# Patient Record
Sex: Female | Born: 1960
Health system: Southern US, Community
[De-identification: ages and names within clinical notes are randomized; demographics above are authoritative.]

## PROBLEM LIST (undated history)

## (undated) DIAGNOSIS — H539 Unspecified visual disturbance: Secondary | ICD-10-CM

## (undated) DIAGNOSIS — T7840XA Allergy, unspecified, initial encounter: Secondary | ICD-10-CM

## (undated) DIAGNOSIS — M5430 Sciatica, unspecified side: Secondary | ICD-10-CM

## (undated) DIAGNOSIS — I4510 Unspecified right bundle-branch block: Secondary | ICD-10-CM

## (undated) DIAGNOSIS — D649 Anemia, unspecified: Secondary | ICD-10-CM

## (undated) DIAGNOSIS — G35 Multiple sclerosis: Secondary | ICD-10-CM

## (undated) DIAGNOSIS — K219 Gastro-esophageal reflux disease without esophagitis: Secondary | ICD-10-CM

## (undated) DIAGNOSIS — K56609 Unspecified intestinal obstruction, unspecified as to partial versus complete obstruction: Secondary | ICD-10-CM

## (undated) DIAGNOSIS — N182 Chronic kidney disease, stage 2 (mild): Secondary | ICD-10-CM

## (undated) DIAGNOSIS — K589 Irritable bowel syndrome without diarrhea: Secondary | ICD-10-CM

## (undated) DIAGNOSIS — E669 Obesity, unspecified: Secondary | ICD-10-CM

## (undated) HISTORY — DX: Irritable bowel syndrome, unspecified: K58.9

## (undated) HISTORY — DX: Gastro-esophageal reflux disease without esophagitis: K21.9

## (undated) HISTORY — DX: Sciatica, unspecified side: M54.30

## (undated) HISTORY — DX: Unspecified visual disturbance: H53.9

## (undated) HISTORY — DX: Allergy, unspecified, initial encounter: T78.40XA

## (undated) HISTORY — DX: Unspecified intestinal obstruction, unspecified as to partial versus complete obstruction: K56.609

## (undated) HISTORY — DX: Obesity, unspecified: E66.9

## (undated) HISTORY — PX: KNEE ARTHROSCOPY: SUR90

---

## 1999-08-02 ENCOUNTER — Ambulatory Visit (HOSPITAL_COMMUNITY): Admission: RE | Admit: 1999-08-02 | Discharge: 1999-08-02 | Payer: Self-pay | Admitting: Family Medicine

## 1999-08-02 ENCOUNTER — Encounter: Payer: Self-pay | Admitting: Family Medicine

## 2003-11-18 ENCOUNTER — Other Ambulatory Visit: Admission: RE | Admit: 2003-11-18 | Discharge: 2003-11-18 | Payer: Self-pay | Admitting: Family Medicine

## 2005-09-29 ENCOUNTER — Emergency Department (HOSPITAL_COMMUNITY): Admission: EM | Admit: 2005-09-29 | Discharge: 2005-09-29 | Payer: Self-pay | Admitting: Family Medicine

## 2006-01-30 HISTORY — PX: ABDOMINAL HYSTERECTOMY: SHX81

## 2006-08-20 ENCOUNTER — Emergency Department (HOSPITAL_COMMUNITY): Admission: EM | Admit: 2006-08-20 | Discharge: 2006-08-20 | Payer: Self-pay | Admitting: Family Medicine

## 2006-09-12 ENCOUNTER — Other Ambulatory Visit: Admission: RE | Admit: 2006-09-12 | Discharge: 2006-09-12 | Payer: Self-pay | Admitting: Gynecology

## 2006-09-24 ENCOUNTER — Ambulatory Visit (HOSPITAL_COMMUNITY): Admission: RE | Admit: 2006-09-24 | Discharge: 2006-09-24 | Payer: Self-pay | Admitting: Gynecology

## 2006-10-05 ENCOUNTER — Encounter (INDEPENDENT_AMBULATORY_CARE_PROVIDER_SITE_OTHER): Payer: Self-pay | Admitting: *Deleted

## 2006-10-05 ENCOUNTER — Other Ambulatory Visit: Admission: RE | Admit: 2006-10-05 | Discharge: 2006-10-05 | Payer: Self-pay | Admitting: *Deleted

## 2006-11-14 ENCOUNTER — Emergency Department (HOSPITAL_COMMUNITY): Admission: EM | Admit: 2006-11-14 | Discharge: 2006-11-14 | Payer: Self-pay | Admitting: Family Medicine

## 2007-01-31 DIAGNOSIS — M5430 Sciatica, unspecified side: Secondary | ICD-10-CM

## 2007-01-31 HISTORY — DX: Sciatica, unspecified side: M54.30

## 2007-02-04 ENCOUNTER — Encounter: Admission: RE | Admit: 2007-02-04 | Discharge: 2007-02-04 | Payer: Self-pay | Admitting: *Deleted

## 2007-02-12 ENCOUNTER — Encounter: Admission: RE | Admit: 2007-02-12 | Discharge: 2007-05-13 | Payer: Self-pay | Admitting: *Deleted

## 2007-05-14 ENCOUNTER — Encounter: Admission: RE | Admit: 2007-05-14 | Discharge: 2007-06-21 | Payer: Self-pay | Admitting: *Deleted

## 2007-08-27 ENCOUNTER — Encounter: Admission: RE | Admit: 2007-08-27 | Discharge: 2007-11-25 | Payer: Self-pay | Admitting: Obstetrics and Gynecology

## 2008-03-03 ENCOUNTER — Encounter
Admission: RE | Admit: 2008-03-03 | Discharge: 2008-03-25 | Payer: Self-pay | Admitting: Physical Medicine and Rehabilitation

## 2008-03-21 ENCOUNTER — Observation Stay (HOSPITAL_COMMUNITY): Admission: EM | Admit: 2008-03-21 | Discharge: 2008-03-22 | Payer: Self-pay | Admitting: Emergency Medicine

## 2008-03-21 ENCOUNTER — Ambulatory Visit: Payer: Self-pay | Admitting: Internal Medicine

## 2008-04-13 ENCOUNTER — Ambulatory Visit: Admission: RE | Admit: 2008-04-13 | Discharge: 2008-04-13 | Payer: Self-pay | Admitting: Specialist

## 2008-04-13 ENCOUNTER — Encounter (INDEPENDENT_AMBULATORY_CARE_PROVIDER_SITE_OTHER): Payer: Self-pay | Admitting: Specialist

## 2008-04-13 ENCOUNTER — Ambulatory Visit: Payer: Self-pay | Admitting: Vascular Surgery

## 2008-05-22 ENCOUNTER — Ambulatory Visit (HOSPITAL_BASED_OUTPATIENT_CLINIC_OR_DEPARTMENT_OTHER): Admission: RE | Admit: 2008-05-22 | Discharge: 2008-05-22 | Payer: Self-pay | Admitting: Specialist

## 2008-07-25 ENCOUNTER — Inpatient Hospital Stay (HOSPITAL_COMMUNITY): Admission: EM | Admit: 2008-07-25 | Discharge: 2008-07-29 | Payer: Self-pay | Admitting: Emergency Medicine

## 2008-11-02 ENCOUNTER — Ambulatory Visit (HOSPITAL_BASED_OUTPATIENT_CLINIC_OR_DEPARTMENT_OTHER): Admission: RE | Admit: 2008-11-02 | Discharge: 2008-11-02 | Payer: Self-pay | Admitting: Specialist

## 2010-02-20 ENCOUNTER — Encounter: Payer: Self-pay | Admitting: Family Medicine

## 2010-05-05 LAB — POCT HEMOGLOBIN-HEMACUE: Hemoglobin: 11.2 g/dL — ABNORMAL LOW (ref 12.0–15.0)

## 2010-05-09 LAB — CBC
MCV: 85.2 fL (ref 78.0–100.0)
Platelets: 176 10*3/uL (ref 150–400)
WBC: 10 10*3/uL (ref 4.0–10.5)

## 2010-05-09 LAB — RAPID URINE DRUG SCREEN, HOSP PERFORMED
Benzodiazepines: NOT DETECTED
Cocaine: NOT DETECTED
Opiates: POSITIVE — AB

## 2010-05-09 LAB — POCT CARDIAC MARKERS
CKMB, poc: <1 ng/mL — ABNORMAL LOW (ref 1.0–8.0)
Myoglobin, poc: 62.9 ng/mL (ref 12–200)
Myoglobin, poc: 78.6 ng/mL (ref 12–200)
Troponin i, poc: <0.05 ng/mL (ref 0.00–0.09)

## 2010-05-09 LAB — URINALYSIS, ROUTINE W REFLEX MICROSCOPIC
Bilirubin Urine: NEGATIVE
Bilirubin Urine: NEGATIVE
Glucose, UA: NEGATIVE mg/dL
Hgb urine dipstick: NEGATIVE
Ketones, ur: NEGATIVE mg/dL
Protein, ur: NEGATIVE mg/dL
Specific Gravity, Urine: 1.013 (ref 1.005–1.030)
Urobilinogen, UA: 0.2 mg/dL (ref 0.0–1.0)
pH: 8.5 — ABNORMAL HIGH (ref 5.0–8.0)

## 2010-05-09 LAB — CARDIAC PANEL(CRET KIN+CKTOT+MB+TROPI)
Relative Index: 0.9 (ref 0.0–2.5)
Total CK: 162 U/L (ref 7–177)
Troponin I: 0.01 ng/mL (ref 0.00–0.06)

## 2010-05-09 LAB — URINE MICROSCOPIC-ADD ON

## 2010-05-09 LAB — URINALYSIS, MICROSCOPIC ONLY
Glucose, UA: NEGATIVE mg/dL
Ketones, ur: NEGATIVE mg/dL
Nitrite: NEGATIVE
Protein, ur: NEGATIVE mg/dL

## 2010-05-09 LAB — COMPREHENSIVE METABOLIC PANEL
ALT: 9 U/L (ref 0–35)
AST: 10 U/L (ref 0–37)
Albumin: 3.4 g/dL — ABNORMAL LOW (ref 3.5–5.2)
Chloride: 107 mEq/L (ref 96–112)
Creatinine, Ser: 0.87 mg/dL (ref 0.4–1.2)
GFR calc Af Amer: >60 mL/min (ref >60)
Potassium: 4 mEq/L (ref 3.5–5.1)
Sodium: 137 mEq/L (ref 135–145)
Total Bilirubin: 0.4 mg/dL (ref 0.3–1.2)

## 2010-05-09 LAB — DIFFERENTIAL
Basophils Absolute: 0 10*3/uL (ref 0.0–0.1)
Lymphs Abs: 1 10*3/uL (ref 0.7–4.0)
Monocytes Relative: 5 % (ref 3–12)
Neutro Abs: 8.5 10*3/uL — ABNORMAL HIGH (ref 1.7–7.7)

## 2010-05-09 LAB — URINE CULTURE: Colony Count: 25000

## 2010-05-09 LAB — SEDIMENTATION RATE: Sed Rate: 27 mm/hr — ABNORMAL HIGH (ref 0–22)

## 2010-05-09 LAB — FOLATE RBC: RBC Folate: 546 ng/mL (ref 180–600)

## 2010-05-09 LAB — VITAMIN B12: Vitamin B-12: 883 pg/mL (ref 211–911)

## 2010-05-17 LAB — COMPREHENSIVE METABOLIC PANEL
ALT: 14 U/L (ref 0–35)
AST: 15 U/L (ref 0–37)
Albumin: 3.8 g/dL (ref 3.5–5.2)
Alkaline Phosphatase: 70 U/L (ref 39–117)
Calcium: 9.1 mg/dL (ref 8.4–10.5)
GFR calc Af Amer: >60 mL/min (ref >60)
Potassium: 4 mEq/L (ref 3.5–5.1)
Sodium: 138 mEq/L (ref 135–145)
Total Protein: 7.1 g/dL (ref 6.0–8.3)

## 2010-05-17 LAB — URINALYSIS, ROUTINE W REFLEX MICROSCOPIC
Glucose, UA: 100 mg/dL — AB
Glucose, UA: NEGATIVE mg/dL
Hgb urine dipstick: NEGATIVE
Ketones, ur: NEGATIVE mg/dL
Protein, ur: 100 mg/dL — AB
Protein, ur: NEGATIVE mg/dL
Specific Gravity, Urine: 1.02 (ref 1.005–1.030)
Urobilinogen, UA: 1 mg/dL (ref 0.0–1.0)
pH: 7.5 (ref 5.0–8.0)

## 2010-05-17 LAB — DIFFERENTIAL
Basophils Relative: 0 % (ref 0–1)
Eosinophils Absolute: 0 10*3/uL (ref 0.0–0.7)
Lymphs Abs: 1.4 10*3/uL (ref 0.7–4.0)
Monocytes Absolute: 0.5 10*3/uL (ref 0.1–1.0)
Monocytes Relative: 6 % (ref 3–12)

## 2010-05-17 LAB — CK TOTAL AND CKMB (NOT AT ARMC): CK, MB: 1.4 ng/mL (ref 0.3–4.0)

## 2010-05-17 LAB — CBC
Hemoglobin: 12.5 g/dL (ref 12.0–15.0)
MCHC: 34.4 g/dL (ref 30.0–36.0)
Platelets: 206 10*3/uL (ref 150–400)
RDW: 14.4 % (ref 11.5–15.5)

## 2010-05-17 LAB — URINE MICROSCOPIC-ADD ON
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
## 2010-11-14 LAB — POCT URINALYSIS DIP (DEVICE)
Ketones, ur: NEGATIVE
Operator id: 235561
Specific Gravity, Urine: >=1.03

## 2010-11-14 LAB — POCT PREGNANCY, URINE: Operator id: 235561

## 2012-06-10 ENCOUNTER — Ambulatory Visit: Payer: Self-pay

## 2012-06-10 ENCOUNTER — Encounter: Payer: Self-pay | Admitting: Internal Medicine

## 2012-06-11 ENCOUNTER — Encounter: Payer: Self-pay | Admitting: Internal Medicine

## 2012-06-19 ENCOUNTER — Ambulatory Visit (INDEPENDENT_AMBULATORY_CARE_PROVIDER_SITE_OTHER): Payer: No Typology Code available for payment source | Admitting: Internal Medicine

## 2012-06-19 ENCOUNTER — Encounter: Payer: Self-pay | Admitting: Internal Medicine

## 2012-06-19 VITALS — BP 133/86 | HR 90 | Temp 97.1°F | Ht 63.0 in | Wt 230.6 lb

## 2012-06-19 DIAGNOSIS — Z1331 Encounter for screening for depression: Secondary | ICD-10-CM | POA: Insufficient documentation

## 2012-06-19 DIAGNOSIS — Z0000 Encounter for general adult medical examination without abnormal findings: Secondary | ICD-10-CM

## 2012-06-19 DIAGNOSIS — Z1239 Encounter for other screening for malignant neoplasm of breast: Secondary | ICD-10-CM

## 2012-06-19 DIAGNOSIS — R635 Abnormal weight gain: Secondary | ICD-10-CM

## 2012-06-19 DIAGNOSIS — K589 Irritable bowel syndrome without diarrhea: Secondary | ICD-10-CM

## 2012-06-19 LAB — CBC
HCT: 35.4 % — ABNORMAL LOW (ref 36.0–46.0)
Hemoglobin: 11.6 g/dL — ABNORMAL LOW (ref 12.0–15.0)
MCV: 82.3 fL (ref 78.0–100.0)
RBC: 4.3 MIL/uL (ref 3.87–5.11)
RDW: 15.9 % — ABNORMAL HIGH (ref 11.5–15.5)
WBC: 7.1 10*3/uL (ref 4.0–10.5)

## 2012-06-19 MED ORDER — AMITRIPTYLINE HCL 50 MG PO TABS: 50.0000 mg | ORAL_TABLET | Freq: Every day | ORAL | Status: DC

## 2012-06-19 MED ORDER — ONE-DAILY MULTI VITAMINS PO TABS: 1.0000 | ORAL_TABLET | Freq: Every day | ORAL | Status: DC

## 2012-06-19 MED ORDER — POLYETHYL GLYCOL-PROPYL GLYCOL 0.4-0.3 % OP SOLN: 1.0000 [drp] | OPHTHALMIC | Status: DC | PRN

## 2012-06-19 MED ORDER — OMEGA-3 FATTY ACIDS 1000 MG PO CAPS: 2.0000 g | ORAL_CAPSULE | Freq: Every day | ORAL | Status: DC

## 2012-06-19 MED ORDER — B COMPLEX PO TABS: 1.0000 | ORAL_TABLET | Freq: Every day | ORAL | Status: DC

## 2012-06-19 NOTE — Patient Instructions (Addendum)
Please return to clinic in one month for followup   Exercise to Lose Weight Exercise and a healthy diet may help you lose weight. Your doctor may suggest specific exercises. EXERCISE IDEAS AND TIPS  Choose low-cost things you enjoy doing, such as walking, bicycling, or exercising to workout videos.  Take stairs instead of the elevator.  Walk during your lunch break.  Park your car further away from work or school.  Go to a gym or an exercise class.  Start with 5 to 10 minutes of exercise each day. Build up to 30 minutes of exercise 4 to 6 days a week.  Wear shoes with good support and comfortable clothes.  Stretch before and after working out.  Work out until you breathe harder and your heart beats faster.  Drink extra water when you exercise.  Do not do so much that you hurt yourself, feel dizzy, or get very short of breath. Exercises that burn about 150 calories:  Running 1  miles in 15 minutes.  Playing volleyball for 45 to 60 minutes.  Washing and waxing a car for 45 to 60 minutes.  Playing touch football for 45 minutes.  Walking 1  miles in 35 minutes.  Pushing a stroller 1  miles in 30 minutes.  Playing basketball for 30 minutes.  Raking leaves for 30 minutes.  Bicycling 5 miles in 30 minutes.  Walking 2 miles in 30 minutes.  Dancing for 30 minutes.  Shoveling snow for 15 minutes.  Swimming laps for 20 minutes.  Walking up stairs for 15 minutes.  Bicycling 4 miles in 15 minutes.  Gardening for 30 to 45 minutes.  Jumping rope for 15 minutes.  Washing windows or floors for 45 to 60 minutes. Document Released: 02/18/2010 Document Revised: 04/10/2011 Document Reviewed: 02/18/2010 [facility] Patient Information 2013 [place], [place].
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
## 2012-06-19 NOTE — Assessment & Plan Note (Addendum)
Patient with long time history of IBS. She had a colonoscopy in June 2013 which was unremarkable per patient. We are requesting records from her physician to confirm this. Patient will essentially not try any of the therapies I recommend to her. We discussed how trycyclics and antispasmodics such as dicyclomine are first-line therapy in IBS. She states that she is unwilling to try these medications due to their side effects. When I mention amitriptyline, she states that she has tried this in the past and it caused her to gain weight. She again states that the phentermine is the only thing that helps her IBS and that she would like. I handed her a prescription for amitriptyline, however, she declined this medication. On her way up the door, she told the nurse that she would be finding another doctor.  -As patient will not try any of the recommended medications, by culture she may use Imodium as needed for now so recommended a high fiber diet -Encourage Weight loss

## 2012-06-19 NOTE — Assessment & Plan Note (Addendum)
Patient reports 30 pound weight gain over the past 8 months. She states that this is likely because of her inactivity and dietary indiscretions. She asked for phentermine, however, I discussed with the patient that this is not the best option for her and has many side effects, some of which are not known until years later. Patient states that she is unwilling to change her lifestyle and her diet because she has to take care of her husband and doesn't have time for herself. That is why she wants this medication so she can use a pill rather than make the necessary lifestyle changes. I advised her that this was not the best approach and encouraged her to work with [place] to see if we can work together on a better weight loss plan, however, she declined.  -I would like to refer her to nutritional counselor, however, she declined -Will check TSH today for medical causes of weight gain -Diet and exercise counseling today

## 2012-06-19 NOTE — Assessment & Plan Note (Addendum)
Request records from previous PCP. Patient had recent colonoscopy in June 2013, we are requesting records.  -Will make referral for mammogram -Will get baseline BNP

## 2012-06-20 LAB — TSH: TSH: 1.687 u[IU]/mL (ref 0.350–4.500)

## 2012-06-20 LAB — BASIC METABOLIC PANEL WITH GFR
CO2: 25 mEq/L (ref 19–32)
Chloride: 109 mEq/L (ref 96–112)
GFR, Est Non African American: 70 mL/min
Potassium: 4.2 mEq/L (ref 3.5–5.3)
Sodium: 143 mEq/L (ref 135–145)

## 2012-06-20 NOTE — Progress Notes (Signed)
Case discussed with Dr.Kesty soon after the resident saw the patient.  We reviewed the resident's history and exam and pertinent patient test results.  I agree with the assessment, diagnosis and plan of care documented in the resident's note. 

## 2012-07-29 ENCOUNTER — Encounter: Payer: Self-pay | Admitting: *Deleted

## 2012-07-29 ENCOUNTER — Ambulatory Visit (INDEPENDENT_AMBULATORY_CARE_PROVIDER_SITE_OTHER): Payer: No Typology Code available for payment source | Admitting: Dietician

## 2012-07-29 VITALS — Ht 63.0 in | Wt 224.1 lb

## 2012-07-29 DIAGNOSIS — R635 Abnormal weight gain: Secondary | ICD-10-CM
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
## 2012-07-31 ENCOUNTER — Ambulatory Visit (INDEPENDENT_AMBULATORY_CARE_PROVIDER_SITE_OTHER): Payer: No Typology Code available for payment source | Admitting: Internal Medicine

## 2012-07-31 ENCOUNTER — Ambulatory Visit (HOSPITAL_COMMUNITY)
Admission: RE | Admit: 2012-07-31 | Discharge: 2012-07-31 | Disposition: A | Discharge status: Home / Self Care | Payer: No Typology Code available for payment source | Source: Ambulatory Visit | Attending: Internal Medicine | Admitting: Internal Medicine

## 2012-07-31 ENCOUNTER — Encounter: Payer: Self-pay | Admitting: Internal Medicine

## 2012-07-31 VITALS — BP 121/80 | HR 60 | Temp 97.9°F | Ht 63.0 in | Wt 225.0 lb

## 2012-07-31 DIAGNOSIS — K589 Irritable bowel syndrome without diarrhea: Secondary | ICD-10-CM

## 2012-07-31 DIAGNOSIS — I4510 Unspecified right bundle-branch block: Secondary | ICD-10-CM

## 2012-07-31 DIAGNOSIS — R0789 Other chest pain: Secondary | ICD-10-CM | POA: Insufficient documentation

## 2012-07-31 DIAGNOSIS — R9431 Abnormal electrocardiogram [ECG] [EKG]: Secondary | ICD-10-CM | POA: Insufficient documentation

## 2012-07-31 DIAGNOSIS — Z0000 Encounter for general adult medical examination without abnormal findings: Secondary | ICD-10-CM

## 2012-07-31 DIAGNOSIS — R609 Edema, unspecified: Secondary | ICD-10-CM | POA: Insufficient documentation

## 2012-07-31 DIAGNOSIS — Z1239 Encounter for other screening for malignant neoplasm of breast: Secondary | ICD-10-CM

## 2012-07-31 LAB — CBC WITH DIFFERENTIAL/PLATELET
Basophils Absolute: 0 10*3/uL (ref 0.0–0.1)
Eosinophils Absolute: 0 10*3/uL (ref 0.0–0.7)
Eosinophils Relative: 0 % (ref 0–5)
Lymphocytes Relative: 27 % (ref 12–46)
MCV: 84.7 fL (ref 78.0–100.0)
Neutrophils Relative %: 66 % (ref 43–77)
Platelets: 191 10*3/uL (ref 150–400)
RDW: 14.2 % (ref 11.5–15.5)
WBC: 7.6 10*3/uL (ref 4.0–10.5)

## 2012-07-31 LAB — COMPREHENSIVE METABOLIC PANEL
Albumin: 3.4 g/dL — ABNORMAL LOW (ref 3.5–5.2)
Alkaline Phosphatase: 79 U/L (ref 39–117)
CO2: 29 mEq/L (ref 19–32)
Calcium: 9.1 mg/dL (ref 8.4–10.5)
Chloride: 105 mEq/L (ref 96–112)
Glucose, Bld: 89 mg/dL (ref 70–99)
Potassium: 4.2 mEq/L (ref 3.5–5.3)
Sodium: 140 mEq/L (ref 135–145)
Total Protein: 6.5 g/dL (ref 6.0–8.3)
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
## 2012-07-31 NOTE — Patient Instructions (Addendum)
Please bring all your records on your next visit  You have been advised NOT to drive based on the documentation you presented with today.  Please follow back up with Dr. Genevive Bi for clearance  We will follow up with you in regards to your results from lab work today  Please have your mammogram done

## 2012-07-31 NOTE — Progress Notes (Signed)
I discussed this patient thoroughly with Dr. Virgina Organ during the visit.  The patient has been to several hospitals and clinics and reports several dramatic events.  Her immediate concern of a "blockage" in her heart turns out to be RBBB on EKG (otherwise normal) is not an emergency.  The next steps require obtaining old records to avoid duplication and  misinterpretation.

## 2012-08-01 DIAGNOSIS — R609 Edema, unspecified: Secondary | ICD-10-CM | POA: Insufficient documentation

## 2012-08-01 DIAGNOSIS — I4510 Unspecified right bundle-branch block: Secondary | ICD-10-CM | POA: Insufficient documentation

## 2012-08-01 NOTE — Assessment & Plan Note (Signed)
Mammogram referral made again today Apparently had colonoscopy at [facility] medical center, need records Refer to GYN after reviewing records

## 2012-08-01 NOTE — Assessment & Plan Note (Addendum)
Reviewed EKG results with patient.  Will review prior cardiac history with all records and pursue further if echo needed and other cardiac work up.  Reports occasional substernal chest pressure and having to catch her breath especially with exercise and walking distances, for example from parking lot to clinic, but improved at rest.  Denies obvious chest pain or prior MI but does report apparently cardiac arrests x2 while in OR in the past that she was not aware of until later.    -probnp 50.9 on 07/31/12

## 2012-08-01 NOTE — Assessment & Plan Note (Addendum)
Reports swelling of hands and feet x1 month.  Non pitting edema of lower extremities, appeared mild edema but first time meeting patient and she says it was improved at this time.    -probnp 50.9 and albumin 3.4 from labs of 07/31/12.  TSH 1.687 05/2012 -continue to monitor for now -recommended elevating legs and monitoring sodium intake

## 2012-08-01 NOTE — Assessment & Plan Note (Signed)
Claims she knows how to control her diet to help with IBS symptoms.  Had colonoscopy apparently at [facility] center last year, need records.  Reports success with only phentermine.  Did not want to try any other medications on last clinic visit.

## 2012-08-06 ENCOUNTER — Ambulatory Visit (HOSPITAL_COMMUNITY): Payer: No Typology Code available for payment source | Attending: Internal Medicine

## 2012-09-25 ENCOUNTER — Telehealth: Payer: Self-pay | Admitting: *Deleted

## 2012-09-25 NOTE — Telephone Encounter (Signed)
Pt called tooth ache past week - working on dental referral - pt states she talked to [facility] this AM. Pt wants antibiotics - appt made 09/26/12 8:45AM Dr Delane Ginger. Pt states she took Aleve and then in 2 hours took Ibuprofen 800mg  - pt warned about taking taking meds incorrect. Stanton Kidney Merritt Mccravy RN 09/25/12 10:45AM

## 2012-09-26 ENCOUNTER — Ambulatory Visit: Payer: No Typology Code available for payment source | Admitting: Internal Medicine

## 2012-09-26 ENCOUNTER — Encounter: Payer: Self-pay | Admitting: Internal Medicine

## 2012-09-26 ENCOUNTER — Ambulatory Visit (INDEPENDENT_AMBULATORY_CARE_PROVIDER_SITE_OTHER): Payer: No Typology Code available for payment source | Admitting: Internal Medicine

## 2012-09-26 VITALS — BP 132/81 | HR 75 | Temp 98.2°F | Wt 227.8 lb

## 2012-09-26 DIAGNOSIS — Z0000 Encounter for general adult medical examination without abnormal findings: Secondary | ICD-10-CM

## 2012-09-26 DIAGNOSIS — K0401 Reversible pulpitis: Secondary | ICD-10-CM

## 2012-09-26 DIAGNOSIS — K044 Acute apical periodontitis of pulpal origin: Secondary | ICD-10-CM

## 2012-09-26 DIAGNOSIS — Z23 Encounter for immunization: Secondary | ICD-10-CM

## 2012-09-26 MED ORDER — HYDROCODONE-ACETAMINOPHEN 5-325 MG PO TABS: 1.0000 | ORAL_TABLET | Freq: Four times a day (QID) | ORAL | Status: DC | PRN

## 2012-09-26 MED ORDER — CLINDAMYCIN HCL 300 MG PO CAPS: 300.0000 mg | ORAL_CAPSULE | Freq: Three times a day (TID) | ORAL | Status: DC

## 2012-09-26 NOTE — Patient Instructions (Addendum)
It was a pleasure to meet you today! - We will make a referral to the Dental Clinic and the Women's Clinic at [facility]. - We will prescribe an antibiotic called Clindamycin that should be taken until completed.  - We will also prescribe you pain medication called Norco that you can take as needed. You can continue to take your ibuprofen and take Norco as needed. - If you develop swelling in your mouth or outside of your mouth, fever, chills or difficulty breathing, seek medical attention.  - You can also try to visit the [facility] clinic at [facility] on Wednesday evenings. See the attached sheet.

## 2012-09-26 NOTE — Progress Notes (Signed)
Subjective:     Patient ID: Tamara [name], female   DOB: 08-Nov-1960, 52 y.o.   MRN: [id number]  HPI: Patient reports tooth ache for the last 2 months and was told she was referred previously, but when she contacted the dental clinic (on her orange card) she was told that there was no referral in their system. She reports that the tooth is painful when she breathes in air and drinks cold fluids. She rates her pain 10/10 and that it keeps her up through out the night. She does take aleve and ibuprofen for the pain that allows her to eat. The pain is reduced to 8/10 with medication. She is currently taking 2 OTC and 800mg  Ibuprofen in total through out the day. She reports feeling drowsy while taking the ibuprofen and aleve. The pain is reported in the UR, LR and LL quadrants. In the past she had a root canal completed but did not have a crown placed over the restoration. She denies swelling, difficulty managing secretions or difficulty breathing. She reports occasional fevers, but is afebrile today.    Review of Systems  Constitutional: Positive for fever and appetite change. Negative for chills.       Pian when eating has limited diet.   Reports low grade, intermittent fever.   HENT: Positive for ear pain, trouble swallowing and dental problem. Negative for hearing loss, nosebleeds, congestion, sore throat, facial swelling, rhinorrhea, sneezing, drooling, mouth sores, neck pain, neck stiffness, voice change, postnasal drip, sinus pressure, tinnitus and ear discharge.        Pain in right ear that radiates from dental pain.    Eyes: Negative.   Respiratory: Negative.   Cardiovascular: Negative.   Gastrointestinal: Negative for nausea, vomiting, diarrhea and constipation.  Endocrine: Negative.   Genitourinary: Negative.   Musculoskeletal: Negative.   Skin: Negative.   Allergic/Immunologic: Negative.   Neurological: Negative.   Hematological: Negative.   Psychiatric/Behavioral: Negative.          Objective:   Physical Exam  Constitutional: She is oriented to person, place, and time. She appears well-developed and well-nourished.  HENT:  Head: Normocephalic and atraumatic.  Mouth/Throat: No oropharyngeal exudate.    There is no intraoral swelling, trismus, parulis or discharge from decayed dentition appreciated. Teeth #3, 18 and 31 are mildly broken down.    Eyes: Conjunctivae and EOM are normal. Pupils are equal, round, and reactive to light. Scleral icterus is present.  Neck: Normal range of motion. Neck supple.  Cardiovascular: Normal rate and regular rhythm.   Pulmonary/Chest: Effort normal and breath sounds normal.  Abdominal: Soft. Bowel sounds are normal.  Musculoskeletal: Normal range of motion.  Lymphadenopathy:    She has no cervical adenopathy.  Neurological: She is alert and oriented to person, place, and time.  Skin: Skin is warm and dry.       Assessment/Plan:     Findings are consistent with grossly decayed dentition #3, 18 and 31. The symptoms of the dentition are suggestive of acute apical periodontitis 2/2 to incomplete root canal therapy, however dental radiographs are required to confirm diagnosis. Plan to refer to dental clinic for evaluation and treatment. Plan to prescribe 10 day course of Clindamycin 300mg  TID and Norco 5/325 (30 tabs) until patient can be scheduled by her dentist.

## 2012-09-26 NOTE — Progress Notes (Signed)
Subjective:     Patient ID: Tamara [name], female   DOB: 06/17/1960, 52 y.o.   MRN: [id number]  Dental Pain   HPI 52 year old female with chronic dental pain for the past 2 months, exacerbated with cold fluids. She is currently taking ibuprofen 800 OTC for the pain and reports very marginal improvement in her pain. She admits to pain in the right upper and lower and left lower regions. She wants a dental referral and something stronger for the pain. On enquiring about her pain medication allergies (codeine and hydromorphone), she said that her tongue got swollen with hydromorphone, however she has taken vicodin many times in the past with no adverse consequences. She denies any fever, chills, shortness of breath, tongue swelling, or painful nodes in the neck.   The patient has a long and complicated OBGYN history with extensive hysterectomy surgery secondary to menorrhagia (per patient report) and subsequent adhesions. She does have her cervix and one ovary intact. She does not get PAP smears because she claims to be sexually inactive. She requests an OBGYN referral.    Review of Systems As per HPI    Objective:   Physical Exam  HENT:  Mouth/Throat: She does not have dentures. No oral lesions. There is trismus in the jaw. Abnormal dentition. Dental caries present. No dental abscesses or lacerations.    Neck: Normal range of motion. Neck supple.  Cardiovascular: Normal rate and regular rhythm.   No murmur heard. Pulmonary/Chest: Effort normal and breath sounds normal.  Lymphadenopathy:    She has no cervical adenopathy.       Assessment & Plan:     Agree with the plan provided by the medical student.  Acute apical periodontitis - clindamycin 300 mg TID prescribed for 7 days with vicodin 5-325mg  30 tabs for pain. Referral to dental clinic given.   Referral to OBGYN given per patient request.  Flu shot given today.

## 2012-09-27 ENCOUNTER — Telehealth: Payer: Self-pay | Admitting: *Deleted

## 2012-09-27 NOTE — Telephone Encounter (Signed)
Pt called stating meds too expensive at [facility], pt has orange card and will get at [facility], scripts cancelled at [facility]

## 2012-10-08 ENCOUNTER — Encounter: Payer: Self-pay | Admitting: Medical

## 2012-11-21 ENCOUNTER — Encounter: Payer: Self-pay | Admitting: Medical

## 2012-11-21 ENCOUNTER — Ambulatory Visit (INDEPENDENT_AMBULATORY_CARE_PROVIDER_SITE_OTHER): Payer: No Typology Code available for payment source | Admitting: Medical

## 2012-11-21 VITALS — BP 126/79 | HR 60 | Temp 97.2°F | Ht 63.0 in | Wt 218.0 lb

## 2012-11-21 DIAGNOSIS — Z01419 Encounter for gynecological examination (general) (routine) without abnormal findings: Secondary | ICD-10-CM

## 2012-11-21 DIAGNOSIS — R3915 Urgency of urination: Secondary | ICD-10-CM

## 2012-11-21 LAB — POCT URINALYSIS DIP (DEVICE)
Leukocytes, UA: NEGATIVE
Protein, ur: NEGATIVE mg/dL
Urobilinogen, UA: 0.2 mg/dL (ref 0.0–1.0)
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
## 2012-12-11 ENCOUNTER — Ambulatory Visit (HOSPITAL_COMMUNITY)
Admission: RE | Admit: 2012-12-11 | Discharge: 2012-12-11 | Disposition: A | Discharge status: Home / Self Care | Payer: No Typology Code available for payment source | Source: Ambulatory Visit | Attending: Medical | Admitting: Medical

## 2012-12-11 DIAGNOSIS — Z01419 Encounter for gynecological examination (general) (routine) without abnormal findings: Secondary | ICD-10-CM

## 2012-12-11 DIAGNOSIS — Z1231 Encounter for screening mammogram for malignant neoplasm of breast: Secondary | ICD-10-CM | POA: Insufficient documentation

## 2012-12-19 ENCOUNTER — Ambulatory Visit: Payer: No Typology Code available for payment source

## 2013-07-08 ENCOUNTER — Ambulatory Visit (INDEPENDENT_AMBULATORY_CARE_PROVIDER_SITE_OTHER): Payer: Self-pay | Admitting: Internal Medicine

## 2013-07-08 ENCOUNTER — Encounter (HOSPITAL_COMMUNITY): Payer: Self-pay | Admitting: General Practice

## 2013-07-08 ENCOUNTER — Observation Stay (HOSPITAL_COMMUNITY)
Admission: AD | Admit: 2013-07-08 | Discharge: 2013-07-11 | Disposition: A | Discharge status: Skilled Nursing Facility | Payer: Self-pay | Source: Ambulatory Visit | Attending: Internal Medicine | Admitting: Internal Medicine

## 2013-07-08 ENCOUNTER — Encounter: Payer: Self-pay | Admitting: Internal Medicine

## 2013-07-08 VITALS — BP 116/76 | HR 68 | Temp 97.1°F | Wt 214.8 lb

## 2013-07-08 DIAGNOSIS — R195 Other fecal abnormalities: Secondary | ICD-10-CM | POA: Diagnosis present

## 2013-07-08 DIAGNOSIS — D649 Anemia, unspecified: Secondary | ICD-10-CM | POA: Diagnosis present

## 2013-07-08 DIAGNOSIS — R109 Unspecified abdominal pain: Secondary | ICD-10-CM | POA: Insufficient documentation

## 2013-07-08 DIAGNOSIS — R141 Gas pain: Secondary | ICD-10-CM | POA: Insufficient documentation

## 2013-07-08 DIAGNOSIS — K589 Irritable bowel syndrome without diarrhea: Secondary | ICD-10-CM | POA: Diagnosis present

## 2013-07-08 DIAGNOSIS — K922 Gastrointestinal hemorrhage, unspecified: Secondary | ICD-10-CM | POA: Insufficient documentation

## 2013-07-08 DIAGNOSIS — G4700 Insomnia, unspecified: Secondary | ICD-10-CM | POA: Insufficient documentation

## 2013-07-08 DIAGNOSIS — R42 Dizziness and giddiness: Secondary | ICD-10-CM | POA: Diagnosis present

## 2013-07-08 DIAGNOSIS — R143 Flatulence: Secondary | ICD-10-CM

## 2013-07-08 DIAGNOSIS — K625 Hemorrhage of anus and rectum: Principal | ICD-10-CM | POA: Insufficient documentation

## 2013-07-08 DIAGNOSIS — R142 Eructation: Secondary | ICD-10-CM

## 2013-07-08 DIAGNOSIS — Z9181 History of falling: Secondary | ICD-10-CM | POA: Insufficient documentation

## 2013-07-08 HISTORY — DX: Anemia, unspecified: D64.9

## 2013-07-08 LAB — CBC WITH DIFFERENTIAL/PLATELET
BASOS PCT: 0 % (ref 0–1)
Basophils Absolute: 0 10*3/uL (ref 0.0–0.1)
EOS ABS: 0 10*3/uL (ref 0.0–0.7)
EOS PCT: 0 % (ref 0–5)
HEMATOCRIT: 33.9 % — AB (ref 36.0–46.0)
HEMOGLOBIN: 11.1 g/dL — AB (ref 12.0–15.0)
LYMPHS ABS: 2.2 10*3/uL (ref 0.7–4.0)
Lymphocytes Relative: 27 % (ref 12–46)
MCH: 28.2 pg (ref 26.0–34.0)
MCHC: 32.7 g/dL (ref 30.0–36.0)
MCV: 86 fL (ref 78.0–100.0)
MONO ABS: 0.5 10*3/uL (ref 0.1–1.0)
MONOS PCT: 6 % (ref 3–12)
Neutro Abs: 5.4 10*3/uL (ref 1.7–7.7)
Neutrophils Relative %: 67 % (ref 43–77)
Platelets: 219 10*3/uL (ref 150–400)
RBC: 3.94 MIL/uL (ref 3.87–5.11)
RDW: 14.4 % (ref 11.5–15.5)
WBC: 8.1 10*3/uL (ref 4.0–10.5)

## 2013-07-08 LAB — BASIC METABOLIC PANEL WITH GFR
BUN: 16 mg/dL (ref 6–23)
CALCIUM: 9 mg/dL (ref 8.4–10.5)
CO2: 25 meq/L (ref 19–32)
Chloride: 104 mEq/L (ref 96–112)
Creat: 1.01 mg/dL (ref 0.50–1.10)
GFR, EST AFRICAN AMERICAN: 74 mL/min
GFR, Est Non African American: 64 mL/min
GLUCOSE: 81 mg/dL (ref 70–99)
Potassium: 4.1 mEq/L (ref 3.5–5.3)
SODIUM: 145 meq/L (ref 135–145)

## 2013-07-08 MED ORDER — SODIUM CHLORIDE 0.9 % IJ SOLN
3.0000 mL | Freq: Two times a day (BID) | INTRAMUSCULAR | Status: DC
  Administered 2013-07-09 – 2013-07-10 (×2): 3 mL via INTRAVENOUS

## 2013-07-08 MED ORDER — ZOLPIDEM TARTRATE 5 MG PO TABS
5.0000 mg | ORAL_TABLET | Freq: Every evening | ORAL | Status: DC | PRN
  Administered 2013-07-08 – 2013-07-09 (×2): 5 mg via ORAL
  Filled 2013-07-08 (×2): qty 1

## 2013-07-08 MED ORDER — SODIUM CHLORIDE 0.9 % IV SOLN: 250.0000 mL | INTRAVENOUS | Status: DC | PRN

## 2013-07-08 MED ORDER — ACETAMINOPHEN 650 MG RE SUPP
650.0000 mg | Freq: Four times a day (QID) | RECTAL | Status: DC | PRN
Start: 2013-07-08 — End: 2013-07-11

## 2013-07-08 MED ORDER — DOXYLAMINE SUCCINATE (SLEEP) 25 MG PO TABS: 25.0000 mg | ORAL_TABLET | Freq: Every evening | ORAL | Status: DC | PRN

## 2013-07-08 MED ORDER — PANTOPRAZOLE SODIUM 40 MG PO TBEC
40.0000 mg | DELAYED_RELEASE_TABLET | Freq: Every day | ORAL | Status: DC
  Administered 2013-07-08 – 2013-07-11 (×4): 40 mg via ORAL
  Filled 2013-07-08 (×4): qty 1

## 2013-07-08 MED ORDER — SODIUM CHLORIDE 0.9 % IJ SOLN: 3.0000 mL | INTRAMUSCULAR | Status: DC | PRN

## 2013-07-08 MED ORDER — ACETAMINOPHEN 325 MG PO TABS
650.0000 mg | ORAL_TABLET | Freq: Four times a day (QID) | ORAL | Status: DC | PRN
  Administered 2013-07-09 – 2013-07-10 (×4): 650 mg via ORAL
  Filled 2013-07-08 (×5): qty 2

## 2013-07-08 MED ORDER — ONDANSETRON 4 MG PO TBDP
4.0000 mg | ORAL_TABLET | Freq: Three times a day (TID) | ORAL | Status: DC | PRN
  Filled 2013-07-08: qty 1

## 2013-07-08 MED ORDER — SODIUM CHLORIDE 0.9 % IJ SOLN: 3.0000 mL | Freq: Two times a day (BID) | INTRAMUSCULAR | Status: DC

## 2013-07-08 NOTE — Patient Instructions (Signed)
  Rectal Bleeding  Rectal bleeding is when blood comes out of the opening of the butt (anus). Rectal bleeding may show up as bright red blood or really dark poop (stool). The poop may look dark red, maroon, or black. Rectal bleeding is often a sign that something is wrong. This needs to be checked by a doctor.  HOME CARE  Eat a diet high in fiber. This will help keep your poop soft.  Limit activitiy.  Drink enough fluids to keep your pee (urine) clear or pale yellow.  Take a warm bath to soothe any pain.  Follow up with your doctor as told. GET HELP RIGHT AWAY IF:  You have more bleeding.  You have black or dark red poop.  You throw up (vomit) blood or it looks like coffee grounds.  You have belly (abdominal) pain or tenderness.  You have a fever.  You feel weak, sick to your stomach (nauseous), or you pass out (faint).  You have pain that is so bad you cannot poop (bowel movement). MAKE SURE YOU:  Understand these instructions.  Will watch your condition.  Will get help right away if you are not doing well or get worse. Document Released: 09/28/2010 Document Revised: 04/10/2011 Document Reviewed: 09/28/2010 [facility] Patient Information 2014 [place], [place].

## 2013-07-08 NOTE — Progress Notes (Signed)
Assess pt for possible start of  NSL - poor venous access.

## 2013-07-08 NOTE — Assessment & Plan Note (Addendum)
Assessment: Pt with well-controlled IBS with diet modification with no recent flare or unintentional weight loss.   Plan: -Continue current diet  -Referral to GI for colonoscopy once orange card renewed

## 2013-07-08 NOTE — Assessment & Plan Note (Signed)
Assessment: Pt s/p hysterectomy due to menorrhagia from uterine fibroids in 2008 with history of chronic normocytic anemia since 2010 with 1st episode of BRBPR of unknown etiology who presents with mild hemodynamic instability and lightheadedness.    Plan: -Obtain stat CBC ---> Hg 11.1 near baseline 11.5 -Consider anemia panel  -Monitor for bleeding

## 2013-07-08 NOTE — Progress Notes (Signed)
Patient ID: Tamara [name], female   DOB: 07-04-1960, 53 y.o.   MRN: [id number]   Subjective:   Patient ID: Tamara [name] female   DOB: Sep 17, 1960 53 y.o.   MRN: [id number]  HPI: Tamara [name] is a 53 y.o. pleasant woman with past medical history of IBS and hysterectomy in 2008 who presents with rectal bleeding.   Pt reports she has been cleaning her house and working in the yard for the past few days and taking OTC ibuprofen (400 mg daily) for the past 3 days for pain. This morning after cleaning she went to use the bathroom and had painless bright red blood per rectum when she wiped. She then had a BM that was darker in nature with clumps of bright red blood with blood also present in the toliet. She then began having lower abdominal cramping similar to menstrual cramping with the sensation that something was coming out of her anal region. She then began to feel lightheaded and is at the present time but denied syncope, dyspnea, or palpitations. She has not had another BM since the episode this morning. She denies nausea or vomiting but reports gas and distension. She has never had BRBPR or blood in her stools before. She reports having hemorrhoids a very long time ago. She had a colonoscopy at [facility] in 2013 which per pt was normal and was told to have repeat imaging in 1 year due to history of IBS.  No FH of colon cancer (father died of gallbladder cancer). She denies taking aspirin, being on [facility] therapy, or history of easy bleeding/brusiing.   Her IBS has been well-controlled with diet which since March of this year has increased her intake of fiber and fluids. She reports intentional weight loss. She states she has been having 2 week duration of "colon spasms" which she has been adjusting her diet to. She was having normal BM prior to episode today.  She had hysterectomy in 2008 (one ovary and cervix intact) due to heavy menstrual bleeding from uterine fibroids with no recent vaginal  spotting since 8 months ago.  Her last pap smear was in 2014 which was normal and is due for another one in 2017. She denies urinary symptoms, hematuria, fever, chills, or flank pain.    Past Medical History  Diagnosis Date  . IBS (irritable bowel syndrome)     at age of 53  . Sciatica 2009   Current Outpatient Prescriptions  Medication Sig Dispense Refill  . b complex vitamins tablet Take 1 tablet by mouth daily.      . cholecalciferol (VITAMIN D) 400 UNITS TABS tablet Take by mouth.      . fish oil-omega-3 fatty acids 1000 MG capsule Take 2 capsules (2 g total) by mouth daily.      . Multiple Vitamin (MULTIVITAMIN) tablet Take 1 tablet by mouth daily.      . phentermine 37.5 MG capsule Take 37.5 mg by mouth every morning. Patient reports that she takes 1/2 tablet daily      . Polyethyl Glycol-Propyl Glycol (SYSTANE) 0.4-0.3 % SOLN Apply 1 drop to eye as needed.    0  . vitamin E 100 UNIT capsule Take 100 Units by mouth daily.       No current facility-administered medications for this visit.   Family History  Problem Relation Age of Onset  . Cancer Father   . Hypertension Brother    History   Social History  . Marital Status: Married  Spouse Name: N/A    Number of Children: N/A  . Years of Education: N/A   Social History Main Topics  . Smoking status: Never Smoker   . Smokeless tobacco: Never Used  . Alcohol Use: No  . Drug Use: No  . Sexual Activity: No   Other Topics Concern  . None   Social History Narrative  . None   Review of Systems: Review of Systems  Constitutional: Positive for weight loss (intentional). Negative for fever, chills, malaise/fatigue and diaphoresis.  HENT: Positive for congestion (due to AR) and sore throat (due to AR).   Eyes: Negative for blurred vision.  Respiratory: Positive for shortness of breath (after cleaning). Negative for cough.   Cardiovascular: Positive for leg swelling (trace ankle swelling). Negative for chest pain and  palpitations.  Gastrointestinal: Positive for abdominal pain (lower abdominal). Negative for heartburn, nausea, vomiting, diarrhea and constipation.       BRBPR  Genitourinary: Negative for dysuria, urgency and frequency.  Neurological: Positive for dizziness. Negative for headaches.  Endo/Heme/Allergies: Positive for environmental allergies. Does not bruise/bleed easily.     Objective:  Physical Exam: Filed Vitals:   07/08/13 1433  BP: 105/70  Pulse: 81  Temp: 97.1 F (36.2 C)  TempSrc: Oral  Weight: 214 lb 12.8 oz (97.433 kg)  SpO2: 99%   Physical Exam  Constitutional: She is oriented to person, place, and time. She appears well-developed and well-nourished. No distress.  HENT:  Head: Normocephalic and atraumatic.  Right Ear: External ear normal.  Left Ear: External ear normal.  Nose: Nose normal.  Mouth/Throat: Oropharynx is clear and moist. No oropharyngeal exudate.  Eyes: Conjunctivae and EOM are normal. Pupils are equal, round, and reactive to light. Right eye exhibits no discharge. Left eye exhibits no discharge.  Neck: Normal range of motion. Neck supple.  Cardiovascular: Normal rate, regular rhythm and normal heart sounds.   Pulmonary/Chest: Effort normal and breath sounds normal. No respiratory distress. She has no wheezes. She has no rales.  Abdominal: Soft. Bowel sounds are normal. She exhibits no distension. There is tenderness (mild lower abdominal L>R). There is no rebound and no guarding.  obese  Genitourinary: Vagina normal. Rectal exam shows no external hemorrhoid and no fissure. Guaiac positive stool (inconclusive,  no stool content). There is no rash, tenderness, lesion or injury on the right labia. There is no rash, tenderness, lesion or injury on the left labia. Cervix exhibits no motion tenderness, no discharge and no friability.  Musculoskeletal: Normal range of motion. She exhibits edema (trace b/l pedal). She exhibits no tenderness.  Neurological: She is  alert and oriented to person, place, and time.  Skin: Skin is warm and dry. No rash noted. She is not diaphoretic. No erythema. No pallor.  Psychiatric: She has a normal mood and affect. Her behavior is normal. Judgment and thought content normal.    Assessment & Plan:   Please see problem list for problem-based assessment and plan

## 2013-07-08 NOTE — Progress Notes (Signed)
Patient ID: Tamara [name], female   DOB: [birth year], 53 y.o.   MRN: [id number]  Patient seen and examined with Dr Naaman Plummer. 53 year old female post moderate amount what appears to be lower GI bleed this morning with dark stool per patient. She is taking ibuprofen No more BMs since morning. Patient feels dizzy and light-headed both lying and standing up. 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 She is scared to go back home at this time. CBC shows some drop in HgB; however a true reflection of the bleed will not be seen in CBC at this time. She is relatively hypotensive, but not tachycardic. At this time, I would recommend observation admission overnight with repeat labs and hopefully, if everything remains ok, then discharge in the morning with GI follow up as outpatient.

## 2013-07-08 NOTE — Assessment & Plan Note (Addendum)
Assessment: Pt with history of IBS and normal colonoscopy in 2013 with 1st episode of BRBPR and possible dark stools with recent NSAID use who presents with symptomatic anemia (lightheadedeness) and mild hemodynamic instability (blood pressure below baseline) found to have inconclusive FOBT currently without active bleeding. Etiology unclear, possibly due to recent NSAID use with underlying internal hemorrhoids vs colon polyps vs diverticulosis vs IBD. No concern for infectious colitis. No vaginal bleeding or hematuria to suggest GU pathology.     Plan: -Perform DRE and pelvic exam ---> no evidence of external hemorrhoids  -Obtain FOBT --> inadequate stool sample (possibly mildly positive)  -Obtain stat CBC ----> 11.1 (at baseline of 11.5) -Obtain BMP  -Perform orthostatic vital signs ----> normal  -Admit to inpatient for observation overnight due to continued symptomatic anemia (lightheaded with soft blood pressure 105/70, normal 120-30's) and unable to come to ED overnight (reported husband would not be able to bring her) -Referral to GI for colonoscopy once orange card renewed -Pt instructed to avoid NSAID use in setting of acute GI bleed

## 2013-07-08 NOTE — H&P (Signed)
Date: 07/08/2013               Patient Name:  Tamara [name] MRN: [id number]  DOB: [birth year] Age / Sex: 53 y.o., female   PCP: Jerene Pitch, MD         Medical Service: Internal Medicine Teaching Service         Attending Physician: Dr. Bartholomew Crews, MD    First Contact: Dr. Bing Neighbors, MD Pager: [phone]  Second Contact: Dr. Randell Loop, MD Pager: [phone]       After Hours (After 5p/  First Contact Pager: [phone]  weekends / holidays): Second Contact Pager: [phone]   Chief Complaint: Bloody BM  History of Present Illness: Tamara [name] is a 53 y.o. woman with a pmhx history of IBS and anemia who presents to the hospital with a cc of bloody bowel movement. The patient was in her normal state of health until a few of days ago she developed abdominal cramping and bloating. She modified her diet to include leafy greens and increased fiber. This resolved these symptoms until this morning when she woke up and did not feel well. She states that she felt diaphoretic and malaise. She used the bathroom and blood in her stool. She also reports blood on the seat of the toilet. There were clots in the toilet bowel. She then felt light headed and dizzy after seeing this blood. This resolved with laying down. She also endorses increased abdominal bloating and cramping after this episode.  She was seen in [facility] this afternoon and directly admitted for observation. She has had no bloody bowel movements since the one episode earlier today.  Of note, the patient reports taking liquid NSAID for the three days prior to developing this problem.   Ptnt denies fevers, chills, nausea, vomiting, abdominal pain, sob or chest pain.  Meds: Current Facility-Administered Medications  Medication Dose Route Frequency Provider Last Rate Last Dose  . 0.9 %  sodium chloride infusion  250 mL Intravenous PRN Blain Pais, MD      . acetaminophen (TYLENOL) tablet 650 mg  650 mg Oral Q6H PRN Blain Pais, MD       Or  . acetaminophen (TYLENOL) suppository 650 mg  650 mg Rectal Q6H PRN Blain Pais, MD      . sodium chloride 0.9 % injection 3 mL  3 mL Intravenous Q12H Blain Pais, MD      . sodium chloride 0.9 % injection 3 mL  3 mL Intravenous Q12H Blain Pais, MD      . sodium chloride 0.9 % injection 3 mL  3 mL Intravenous PRN Blain Pais, MD        Allergies: Allergies as of 07/08/2013 - Review Complete 07/08/2013  Allergen Reaction Noted  . Codeine Other (See Comments) 06/19/2012  . Dilaudid [hydromorphone hcl] Swelling 06/19/2012  . Paxil [paroxetine hcl] Other (See Comments) 06/19/2012  . Augmentin [amoxicillin-pot clavulanate] Rash 06/19/2012   Past Medical History  Diagnosis Date  . IBS (irritable bowel syndrome)     at age of 12  . Sciatica 2009  . Anemia    Past Surgical History  Procedure Laterality Date  . Knee arthroscopy  2010 and 2011    Left knee, x2  . Abdominal hysterectomy  2008    cervix and right ovary still intact   Family History  Problem Relation Age of Onset  . Cancer Father   . Hypertension Brother  History   Social History  . Marital Status: Married    Spouse Name: N/A    Number of Children: N/A  . Years of Education: N/A   Occupational History  . Not on file.   Social History Main Topics  . Smoking status: Never Smoker   . Smokeless tobacco: Never Used  . Alcohol Use: No  . Drug Use: No  . Sexual Activity: No   Other Topics Concern  . Not on file   Social History Narrative  . No narrative on file    Review of Systems: Pertinent items are noted in HPI.  Physical Exam: Blood pressure 131/83, pulse 60, temperature 97.8 F (36.6 C), temperature source Oral, resp. rate 20, height 5\' 3"  (1.6 m), weight 215 lb 9.6 oz (97.796 kg), SpO2 100.00%. Physical Exam  Constitutional: She is oriented to person, place, and time. She appears well-developed and well-nourished. No distress.  HENT:    Head: Normocephalic and atraumatic.  Mouth/Throat: Oropharynx is clear and moist.  Cardiovascular: Normal rate, regular rhythm, normal heart sounds and intact distal pulses.  Exam reveals no friction rub.   No murmur heard. Pulmonary/Chest: Effort normal and breath sounds normal. No respiratory distress. She has no wheezes. She has no rales.  Abdominal: Soft. Bowel sounds are normal. She exhibits no distension. There is no tenderness. There is no rebound and no guarding.  Neurological: She is alert and oriented to person, place, and time.  Skin: She is not diaphoretic.  Psychiatric: She has a normal mood and affect. Her behavior is normal.   Rectal and vaginal deferred as patient had this examined in [facility]. See [facility] note for details.   Lab results: Basic Metabolic Panel:  Recent Labs  07/08/13 1440  NA 145  K 4.1  CL 104  CO2 25  GLUCOSE 81  BUN 16  CREATININE 1.01  CALCIUM 9.0   CBC:  Recent Labs  07/08/13 1440  WBC 8.1  NEUTROABS 5.4  HGB 11.1*  HCT 33.9*  MCV 86.0  PLT 219   Urine Drug Screen: Drugs of Abuse     Component Value Date/Time   LABOPIA POSITIVE* 07/26/2008 1559   COCAINSCRNUR NONE DETECTED 07/26/2008 1559   LABBENZ NONE DETECTED 07/26/2008 1559   AMPHETMU NONE DETECTED 07/26/2008 1559   THCU NONE DETECTED 07/26/2008 1559   LABBARB  Value: NONE DETECTED        DRUG SCREEN FOR MEDICAL PURPOSES ONLY.  IF CONFIRMATION IS NEEDED FOR ANY PURPOSE, NOTIFY LAB WITHIN 5 DAYS.        LOWEST DETECTABLE LIMITS FOR URINE DRUG SCREEN Drug Class       Cutoff (ng/mL) Amphetamine      1000 Barbiturate      200 Benzodiazepine   824 Tricyclics       235 Opiates          300 Cocaine          300 THC              50 07/26/2008 1559     Assessment & Plan by Problem: Active Problems:   IBS (irritable bowel syndrome)   Bright red blood per rectum   Normocytic anemia   Lower GI bleed  Lower GI bleed The patient had a possible lower GI bleed earlier today of unknown etiology.  Likely causes are internal hemorrhoids versus diverticular bleeding. Apparently FOBT was indeterminate in [facility] as no stool in vault, will plan to clarify with Dr. Naaman Plummer in am.  The patients hemoglobin is stable. She had a colonoscopy 2 years ago [facility], no records in epic), which was normal per the patients report. She has not active abdomina pain at this time. She has no additional bloody bowel movements. Hg is stable and at baseline.  - trend CBC to ensure stability of Hg - PO PPI  IBS The patients IBS is stable at this time.   Normocytic Anemia Appears stable on admission. Plan to trend CBC.  Insomnia Patient request sleep aid, unisom qhs prn.  Diet: Regular Diet  DVT: SCDs  Dispo: Disposition is deferred at this time, awaiting improvement of current medical problems. Anticipated discharge in approximately 1 day(s).   The patient does have a current PCP Jerene Pitch, MD) and does need an [facility] hospital follow-up appointment after discharge.  The patient does not have transportation limitations that hinder transportation to clinic appointments.  Signed: Marrion Coy, MD 07/08/2013, 8:43 PM

## 2013-07-09 LAB — CBC
HCT: 33.8 % — ABNORMAL LOW (ref 36.0–46.0)
HEMATOCRIT: 33.3 % — AB (ref 36.0–46.0)
Hemoglobin: 10.5 g/dL — ABNORMAL LOW (ref 12.0–15.0)
Hemoglobin: 10.7 g/dL — ABNORMAL LOW (ref 12.0–15.0)
MCH: 27.4 pg (ref 26.0–34.0)
MCH: 27.2 pg (ref 26.0–34.0)
MCHC: 31.5 g/dL (ref 30.0–36.0)
MCHC: 31.7 g/dL (ref 30.0–36.0)
MCV: 86.9 fL (ref 78.0–100.0)
MCV: 86 fL (ref 78.0–100.0)
PLATELETS: 184 10*3/uL (ref 150–400)
Platelets: 190 10*3/uL (ref 150–400)
RBC: 3.83 MIL/uL — ABNORMAL LOW (ref 3.87–5.11)
RBC: 3.93 MIL/uL (ref 3.87–5.11)
RDW: 14.7 % (ref 11.5–15.5)
RDW: 14.1 % (ref 11.5–15.5)
WBC: 5.9 10*3/uL (ref 4.0–10.5)
WBC: 6 10*3/uL (ref 4.0–10.5)

## 2013-07-09 MED ORDER — BISACODYL 10 MG RE SUPP
10.0000 mg | RECTAL | Status: AC
  Administered 2013-07-09: 10 mg via RECTAL
  Filled 2013-07-09: qty 1

## 2013-07-09 MED ORDER — GI COCKTAIL ~~LOC~~
30.0000 mL | Freq: Once | ORAL | Status: AC
  Administered 2013-07-09: 30 mL via ORAL
  Filled 2013-07-09: qty 30

## 2013-07-09 NOTE — Progress Notes (Signed)
At approx 2045 pt was found on the floor patient stated that she did not fall but became dizzy with a headache and was returning to bed after bathroom pt had a bowel movent some blood in H2O and stool was Shyler Hamill in color. Vital signs WNL with exception of blood pressure 157/87 hr 62 MD on call was notified. Pt had no bruising was Alert and orient x4 and no loss of conscience. Charge RN was notified. New orders received pt was placed with yellow socks armband and bed alarm and instructed to use call bell or telephone to ask for assistance. Arthor Captain LPN

## 2013-07-09 NOTE — Care Management Note (Signed)
Page 1 of 2   07/11/2013     5:25:58 PM CARE MANAGEMENT NOTE 07/11/2013  Patient:  Tamara [name],Tamara [name]   Account Number:  [id number]  Date Initiated:  07/09/2013  Documentation initiated by:  Tomi Bamberger  Subjective/Objective Assessment:   dx anemi  admit as observation- lives with family.     Action/Plan:   Anticipated DC Date:  07/10/2013   Anticipated DC Plan:  [place]  CM consult      Choice offered to / List presented to:             Status of service:   Medicare Important Message given?   (If response is "NO", the following Medicare IM given date fields will be blank) Date Medicare IM given:   Date Additional Medicare IM given:    Discharge Disposition:  HOME/SELF CARE  Per UR Regulation:    If discussed at Long Length of Stay Meetings, dates discussed:    Comments:  07/11/13 [place], BSN NCM spoke with Bonna Gains in [facility] and she stated that she informed patient to apply for Obama care insurance and once she is rejected she can then process her for an orange card and that patient can come down to here office to pick up the orange card before she leaves the hospital and that patient can make an apt with the internal medicine clinic.  The patient has an apt already with internal medicince clinic, so that will not need to be made.  Patient was upset and stated she wanted to make a complaint because she was getting mis communication.  NCM , Director or unit and AD of unti went in to speak with patient.  She states that she is upset because she was told that she will need to go down to pick up the orange card from [facility] on the ground floor and that she need to have the orange card before the MD could make her Gastroenterology apt and also the physical therapist states she needs a nuero apt.  NCM informed patient that the MD is in charge of making referral apts for what  Gastroenterologist she wants patient  to see and if she decides patient needs a to see a neurologist.  NCM called Bonna Gains and asked if could come down to pick orange card up for patient or does patient have to , she states yes I could pick up for patient .  NCM informed patient of this information and her to call Bonna Gains from her hospital room.  When patient hung up from speaking with Bonna Gains she seem to be ok and said she would not need to make a complaint as long as her needs are taken care of.  Patient 's dc plan is to go to snf with log for 14 days.  A while later NCM received message from [place] stating patient is now saying she is not going to snf she is going home .  NCM tried to call Bonna Gains  and got vm, so NCM and AD went to see Bonna Gains in her office, she states she is unable to do orange card for patient because her houshold income is over [phone].  NCM and AD went back to patient's room to speak with her, she was on the phone, we waited for 15 mins, she continue to be on the phone so we informed her we will be back.  NCM , AD and Director went to patient's room she conts to be on the phone we waited again about 15 mins.  We informed her we would try to come back.  NCM recieved call from [place] , the [facility], he asked me if patient was still going to snf, I informed him I was not sure but CSW stated patient wanted to go home.  Almyra Free the [place] covering , came up and spoke with patient and patient informed her she would be going home.  RN states patient said she will be going home.  MD has put a dc order in for patient.   07/10/13 Received order for HHPT/OT. Checked with [facility] to see if patient's dx would qualify for HHPT/OT per Medicaid guidelines. Per Butch Penny at [facility] the patient's dx does not qualify for HHPT or OT even with vestibular needs. Informed Dr. Aida Raider and patient's RN that patient does not qualify for HHPT/OT.   07/09/13 [place], BSN [phone] NCM spoke with patient ,  informed her that NCM spoke with Bonna Gains in the internal medicine clinic where she will be followed.  Bonna Gains states she informed the patient yesterday that she will need to apply for the obama care plan,then when it says she is not eligible she can apply for the orange card.  Patient states she is aware of this, she states she is concerned about her hospital bill.  NCM called financial counselor Aileen Fass , she states she will call patient in the room to talk to her about medicaid.  NCM also called patient accounting, and they state they could not speak with patient at this time because it looks like she may be applying for medicaid.

## 2013-07-09 NOTE — Progress Notes (Signed)
Subjective: Complaints that she is dissatisfied. Wants to have a colonoscopy done while on admission. Reassured about her normal colonoscopy done in June 2012- Results of which we were able to fax over from [facility]. No other complaints. Pt say she has not had a bowel movement since the episode of bleeding yesterday morning. Pt says she is dizzy, be systolic Bp is usually- 948 she says.  Objective: Vital signs in last 24 hours: Filed Vitals:   07/08/13 1809 07/08/13 2220 07/09/13 0507 07/09/13 1455  BP: 131/83 118/78 108/72 108/63  Pulse: 60 63 57 62  Temp: 97.8 F (36.6 C) 97.9 F (36.6 C) 98.5 F (36.9 C) 98.1 F (36.7 C)  TempSrc: Oral Oral Oral Oral  Resp: 20 20 18 18   Height: 5\' 3"  (1.6 m)     Weight: 215 lb 9.6 oz (97.796 kg)     SpO2: 100% 99% 100% 100%   Weight change:   Intake/Output Summary (Last 24 hours) at 07/09/13 1551 Last data filed at 07/08/13 1957  Gross per 24 hour  Intake    360 ml  Output      0 ml  Net    360 ml   GENERAL- alert, co-operative, appears as stated age, not in any distress. HEENT- Atraumatic, normocephalic. CARDIAC- RRR. RESP- No added sounds. ABDOMEN- Soft, non tender. NEURO- No obvious Cr N abnormality, strenght upper and lower extremities- 5/5. EXTREMITIES- pulse 2+, symmetric, no pedal edema. SKIN- Warm, dry, No rash or lesion. PSYCH- Normal mood and affect.  Lab Results: Basic Metabolic Panel:  Recent Labs Lab 07/08/13 1440  NA 145  K 4.1  CL 104  CO2 25  GLUCOSE 81  BUN 16  CREATININE 1.01  CALCIUM 9.0   CBC:  Recent Labs Lab 07/08/13 1440 07/09/13 0630  WBC 8.1 5.9  NEUTROABS 5.4  --   HGB 11.1* 10.5*  HCT 33.9* 33.3*  MCV 86.0 86.9  PLT 219 184   Urine Drug Screen: Drugs of Abuse     Component Value Date/Time   LABOPIA POSITIVE* 07/26/2008 1559   COCAINSCRNUR NONE DETECTED 07/26/2008 1559   LABBENZ NONE DETECTED 07/26/2008 1559   AMPHETMU NONE DETECTED 07/26/2008 1559   THCU NONE DETECTED  07/26/2008 1559   LABBARB  Value: NONE DETECTED        DRUG SCREEN FOR MEDICAL PURPOSES ONLY.  IF CONFIRMATION IS NEEDED FOR ANY PURPOSE, NOTIFY LAB WITHIN 5 DAYS.        LOWEST DETECTABLE LIMITS FOR URINE DRUG SCREEN Drug Class       Cutoff (ng/mL) Amphetamine      1000 Barbiturate      200 Benzodiazepine   016 Tricyclics       553 Opiates          300 Cocaine          300 THC              50 07/26/2008 1559    Medications: I have reviewed the patient's current medications. Scheduled Meds: . pantoprazole  40 mg Oral Daily  . sodium chloride  3 mL Intravenous Q12H  . sodium chloride  3 mL Intravenous Q12H   Continuous Infusions:  PRN Meds:.sodium chloride, acetaminophen, acetaminophen, ondansetron, sodium chloride, zolpidem Assessment/Plan:  Lower GI bleed  The patient had a possible lower GI bleed earlier today of unknown etiology. Likely causes are internal hemorrhoids or fissure. Colonoscopy done 2012- at [place] obtained, showed feats consistent with IBS, but otherwise  normal colonoscopy. This was done because pt was enrolled in a study for IBS. Patients hemoglobin is stable.  - Orthostatic vitals - CBC at 6pm today, If drops< 2 from presentation will consider getting GI consult. - PO PPI   IBS  The patients IBS is stable at this time.   Normocytic Anemia  Appears stable on admission. Plan to trend CBC.   Insomnia  Patient request sleep aid, unisom qhs prn.   Diet: Regular Diet   DVT: SCDs  Dispo: Disposition is deferred at this time, awaiting improvement of current medical problems.  Anticipated discharge in approximately 1-2 day(s).   The patient does have a current PCP Jerene Pitch, MD) and does need an [facility] hospital follow-up appointment after discharge.  The patient does not know have transportation limitations that hinder transportation to clinic appointments.  .Services Needed at time of discharge: Y = Yes, Blank = No PT:   OT:   RN:   Equipment:     Other:     LOS: 1 day   Jenetta Downer, MD 07/09/2013, 3:51 PM

## 2013-07-09 NOTE — Progress Notes (Signed)
Called to patient room due to patient almost falling. The patient went to the bathroom and had a BM. She notes small amount of blood on the tissue paper and in the toilet. She was unable to describe if the blood was red or dark. She then got up to return to her bed. Upon standing she felt weak and dizzy. She felt like her left leg was shaking and struggling to support her weight. She sat down on the floor. She reports that she did not fall. She states that she has no pain. Nursing staff was able to help the patient to the bed.   I suspect that the patient may have had a vasovagal episode with no LOC. Alternatively, she may have some difficulty with ambulation. I have activated fall precautions and instructed the patient to not ambulate without help of nurse. Futher, I have placed the patient on telemetry to ensure no arrythmia is present. Also, I ordered PT eval for am.

## 2013-07-09 NOTE — H&P (Signed)
  Date: 07/09/2013  Patient name: Tamara [name]  Medical record number: [id number]  Date of birth: [birth year]   I have seen and evaluated Tamara [name] and discussed their care with the Residency Team. Tamara [name] has known IBS and is well versed in managing its sxs. She had BRBPR with dizziness on AM of admit. She has never had this before. Her hgB baseline is about 11 and she was admitted at 11.1. 24 hours after the bleeding, her HgB was 10.5 and she has had no further bleeding or BM. She had a colonoscopy 2 yrs ago as part of a pharmaceutical study on a new IBS drug. Per pt, it was nl.   Assessment and Plan: I have seen and evaluated the patient as outlined above. I agree with the formulated Assessment and Plan as detailed in the residents' admission note, with the following changes:   1. LGI bleed - etiology is likely hemorrhoidal or fissure. Bleeding has spontaneously resolved. HgB and vitals are stable. Dr E is attempting to get the colon report. Likely can be D/C today with outpt F/U  - IMC and GI.  2. IBS - pt's pain is increased today but she has had this pain before and knows it is IBS related. She is concerned bc she has not had a BM today and that is unusual for her. Glycerin suppository.   3. Anemia - pt has never had it worked up and didn't know she had anemia. Will W/U as outpt with ferritin. Get colon report. Likely is iron def as pt had heavy periods until her TAH and was on FeSO4 for a time.  Likely D/C later today after BM.   Bartholomew Crews, MD 6/10/20152:56 PM

## 2013-07-09 NOTE — Progress Notes (Signed)
UR completed 

## 2013-07-10 DIAGNOSIS — R42 Dizziness and giddiness: Secondary | ICD-10-CM | POA: Diagnosis present

## 2013-07-10 NOTE — Discharge Summary (Addendum)
Name: Tamara [name] MRN: [id number] DOB: [birth year] 53 y.o. PCP: Jerene Pitch, MD  Date of Admission: 07/08/2013  5:12 PM Date of Discharge: 07/11/2013 Attending Physician: Bartholomew Crews, MD  Discharge Diagnosis: Active Problems:   Bright red blood per rectum   IBS (irritable bowel syndrome)   Normocytic anemia   Dizziness  Discharge Medications:   Medication List    STOP taking these medications       ibuprofen 200 MG tablet  Commonly known as:  ADVIL,MOTRIN     OVER THE COUNTER MEDICATION     phentermine 37.5 MG capsule      TAKE these medications       ACAI BERRY PO  Take 1 capsule by mouth daily as needed (for vitamin).     b complex vitamins tablet  Take 1 tablet by mouth daily.     cholecalciferol 400 UNITS Tabs tablet  Commonly known as:  VITAMIN D  Take 400 Units by mouth daily.     Fish Oil 1000 MG Caps  Take 1,000-2,000 mg by mouth 2 (two) times daily. Take 2 capsules in the morning and take 1 capsule at bedtime     GARCINIA CAMBOGIA-CHROMIUM PO  Take 1 capsule by mouth daily as needed (for vitamin).     multivitamin tablet  Take 1 tablet by mouth daily.     SYSTANE 0.4-0.3 % Soln  Generic drug:  Polyethyl Glycol-Propyl Glycol  Place 1 drop into both eyes daily as needed (for dry eyes).     Vicks Vaporub 4.73-1.2-2.6 % Oint  Apply 1 application topically daily as needed (for feet).     vitamin E 100 UNIT capsule  Take 100 Units by mouth daily.        Disposition and follow-up:   Tamara [name] was discharged from [facility] in Good condition.  At the hospital follow up visit please address:  1.  GI refferal for GI bleed.   2.  Labs / imaging needed at time of follow-up: None  3.  Pending labs/ test needing follow-up: None.  Follow-up Appointments: Follow-up Information   Follow up with Juluis Mire, MD On 07/21/2013. (At 1.15pm.)    Specialty:  Internal Medicine   Contact information:   [place] [address] [phone]       Discharge Instructions: Discharge Instructions   Diet - low sodium heart healthy    Complete by:  As directed      Discharge instructions    Complete by:  As directed   It is important you not drive any vehicle.   We recommend you stop the weight loss medication for now, as this has been known to cause dizziness.   Also please limit the number of times you take Advil, motrin, ibuprofen or Naproxen to occasional doses. This medication can cause stomach ulcers and bleeding if taken in excess.     Driving Restrictions    Complete by:  As directed   Please do not drive until you are re-evaluated in clinic, this is because of your dizziness.     Increase activity slowly    Complete by:  As directed            Consultations:  None  Procedures Performed:  No results found.  2D Echo: None  Cardiac Cath: None  Admission HPI: Chief Complaint: Bloody BM   History of Present Illness: Tamara [name] is a 53 y.o. woman with a pmhx history of IBS  and anemia who presents to the hospital with a cc of bloody bowel movement. The patient was in her normal state of health until a few of days ago she developed abdominal cramping and bloating. She modified her diet to include leafy greens and increased fiber. This resolved these symptoms until this morning when she woke up and did not feel well. She states that she felt diaphoretic and malaise. She used the bathroom and blood in her stool. She also reports blood on the seat of the toilet. There were clots in the toilet bowel. She then felt light headed and dizzy after seeing this blood. This resolved with laying down. She also endorses increased abdominal bloating and cramping after this episode. She was seen in [facility] this afternoon and directly admitted for observation. She has had no bloody bowel movements since the one episode earlier today.  Of note, the patient reports taking liquid NSAID for the three days  prior to developing this problem.  Pt denies fevers, chills, nausea, vomiting, abdominal pain, sob or chest pain.  Physical Exam  Constitutional: She is oriented to person, place, and time. She appears well-developed and well-nourished. No distress.  HENT:  Head: Normocephalic and atraumatic.  Mouth/Throat: Oropharynx is clear and moist.  Cardiovascular: Normal rate, regular rhythm, normal heart sounds and intact distal pulses. Exam reveals no friction rub.  No murmur heard.  Pulmonary/Chest: Effort normal and breath sounds normal. No respiratory distress. She has no wheezes. She has no rales.  Abdominal: Soft. Bowel sounds are normal. She exhibits no distension. There is no tenderness. There is no rebound and no guarding.  Neurological: She is alert and oriented to person, place, and time.  Skin: She is not diaphoretic.  Psychiatric: She has a normal mood and affect. Her behavior is normal.   Rectal and vaginal deferred as patient had this examined in [facility]. See [facility] note for details.  Hospital Course by problem list:   Lower GI bleed - Presented to clinic with blood per rectum with complaints of dizziness, Vitals were stable, pt was not orthostatic, CBC- on admission- 11.1, repeat 10.5 the next morning and 10.7  Twelve hrs later. Pt reported having another bowel movement while on admission, which contained minimal amount of blood- streaks, as reported by Pt- didn't show the nursing staff, as she said it was nothing to be concerned about. Pt had a colonoscopy at [facility] [place]- June 2012 (Done as part of a trial study for a new drug for IBS). Results of this colonoscopy were reported as - Features consistent with Irritable bowel syndrome, otherwise normal colonoscopy. Pt was therefore reassured, and was to follow up as an out-pt with [facility]. Pt wanted GI consulted during hospital stay, insisting that she wanted the cause determined, despite reassurance considering  results of recent normal colonoscopy. Likely cause of patients bleeding was thought to be internal hemorrhoids, or fissures. On discharge to SNF, pts vitals remained, stable, without subsequent episodes of significant bloody bowel movement.    Dizziness- Reported dizziness on admission. Pt has a hx of unsteady gait- Ct scan- [place] spine and cervical spine in 2010- Negative for any explanatory etiology, showed early facet degen changes- L3,-L4, L4-5, and MRI brain- 2010 Nonspecific white matter changes supratentorially- ?gliosis related to HTN or DM, less likley demyelinating process or vasculitis. Vitals remained stable, without orthostasis. Pt is on a weightloss medication called- Phentermine, which we recommended pt stop- for a while to determine if this was contributing ,  as this drug is known to cause dizziness. Pt also got a dose of Ambien-5mg , for insomnia, so possibly this contributed. Pt was vehemently opposed to this as she said she has been taking this medication for several years. Pt had an MRI brain 20, On the initial day of planned Discharge, pt said this was still on going. On the night prior to discharge, pt almost fell. Physical therapy was consulted and recs were for SNF for vestibular rehab or 24 hrs supervision, as there was some concern for vestibular issues. Pt ws initially very opposed to going to SNF, Saying she will get very depressed, was concerned about the financial burden (though we told her we could get a place for her despite not having insurance), pt elected to go home. Home health PT could not be arranged as pt did not meet criteria. Pt later decided against going home- 07/10/2013 and elected to go to SNF for rehab.   IBS  The patients IBS was stable on admission and on discharge.   Normocytic Anemia  Appears stable on admission. Further work up as an out patient.   Addendum- 07/12/3013- Pt declined SNF placement, and finally elected to go home. Pt has capacity, can make her  own decisions, Pt was therefore discharged home.   Discharge Vitals:   BP 105/69  Pulse 61  Temp(Src) 98.4 F (36.9 C) (Oral)  Resp 18  Ht 5\' 3"  (1.6 m)  Wt 215 lb 9.6 oz (97.796 kg)  BMI 38.20 kg/m2  SpO2 99%  Discharge Labs:  No results found for this or any previous visit (from the past 24 hour(s)).  Signed: Jenetta Downer, MD 07/11/2013, 10:59 AM   Time Spent on Discharge: 35 minutes Services Ordered on Discharge: None. Equipment Ordered on Discharge: None.

## 2013-07-10 NOTE — Progress Notes (Signed)
Tried to d/c patient. Earlier pt had refused SNF. Case manager tried to set up [facility] for pt but it insurance was denied. Case manger spoke to me and stated that pt was interested in outpatient therapy. I called MD and asked if everything was ready for pt to be discharged. MD stated pt would be able to go home and set up outpatient therapy with her PCP. Pt was very upset stating that she "had not refused SNF, stop putting words in my mouth". Pt stated that she had said no to SNF because insurance was not going to cover and that she never talked to a Education officer, museum and never got any information on skilled nursing facilities. I asked that pt if she would be willing to go to a SNF and she said yes. At this point, MD was paged and MD said they would come speak to the pt. I spoke to the after-hours social worker who stated that pt could get a letter of Guarantee. I told her that the MD would be up to talk to the pt.

## 2013-07-10 NOTE — Progress Notes (Signed)
Got paged to talk to Tamara [name], Tamara daughter who was at the bedside, and Tamara sister (?Rida) who was on the phone. Tamara [name] wanted me to explain to Tamara sister, who identifies herself as a Marine scientist, the entire hospital course for Tamara [name] and Tamara discharge planning. With Tamara [name]'s permission, I explained the hospital course to Tamara sister and answered all Tamara questions. Tamara sister thanked me for the information and hung up. Tamara [name] denied stating to the medical team this morning that she did not want to go to SNF because "I will be depressed with the old people". She was fixed in the idea that the the medical team had not offered SNF to Tamara because she does not have medical insurance. I reminded Tamara that our attending had explained to Tamara that this is not necessarily true as SNFs do take uninsured patients but she had again stated that she did not want to go to a SNF. I also explained to Tamara [name] that she has capacity to make Tamara own medical decisions the medical team will respect Tamara autonomy to make Tamara decision in regards to Tamara discharge. Tamara [name] was upset because a Education officer, museum had not been consulted in regards to placement to SNF. I informed Tamara that when a patient refuses to go to SNF a social worker consult is not ordered for SNF placement. I also reminded Tamara that she has autonomy to refuse SNF placement and in no way should feel obligated to go to SNF but the medical team did recommend for Tamara to be discharged from the hospital to SNF for vestibular Rehab PT.  In further conversation with Tamara [name] and Tamara daughter, it appears she was under a misunderstanding that she would not qualify to SNF due lack of insurance. I apologized to Tamara and to Tamara daughter for this misunderstanding. She and Tamara daughter thanked me for this clarification.   Tamara [name] wants to stay overnight to have a social worker consult in the morning for SNF placement.   I deleted the order for discharge and placed a social worker  consult for Tamara. The patient's RN and the charge nurse were notified.

## 2013-07-10 NOTE — Progress Notes (Signed)
  Date: 07/10/2013  Patient name: Tamara [name]  Medical record number: [id number]  Date of birth: Jun 16, 1960   This patient has been seen and the plan of care was discussed with the house staff. Please see their note for complete details. I concur with their findings with the following additions/corrections: The team met with the pt this AM and her daughter was present.   1. BRBPR 2/2 Lower GI bleed - the pt is a bit frustrated bc she did not get a colonoscopy in the hospital to know what is going on. Her HgB is stable over 24 hours, her vitals are stable, she is not orthostatic, and has had no more sig bleeding. She had a nl colonoscopy 2 yrs ago. Daily, we have been explaining the difference btw emergency bleeding and non emergency bleeding. She is stable for outpt F/U and GHI consult.  2. Dizziness - Dr E has pointed out that phentermine may cause dizziness but pt is reluctant to D/C the med. She is not orthostatic. Her dizziness is mostly on standing and walking and lasts 1-2 min. She must hold onto something when she walks. This is new for her and was present on day of admission prior to admit. She drove herself to the [facility]. She was independent in ADL's per [facility] nursing assessment. PT eval found vestibular issues and rec SNF for vestibular rehab. Pt is adamant that she is not going to SNF. She initially requests another day in hospital. Team explained that her deficits would not resolve in 24 hours and required more prolonged tx. We explained that we would keep her inpt only as bridge to SNF. PT again refuses SNF and requests to be D/C'd that others were "more in need of the bed." Pt's daughter is adamant that the pt will not be safe aet home and there are no family members to provide 24 hour care and was critical that pt was not ambulated yesterday and that is why she is deconditioned. The pt is an otherwise healthy 53 yo and a 48 hour hospitalization would not cause any sig amount of muscular atrophy. The  daughter states that if the pt falls at home, she is calling 911.   This is a bizarre situation. Regardless, the pt is medically stable for D/C. Her options are to go to SNF, which she refuses, or home, which she elects. She agrees to home PT but without a payer source, I do not know if this will occur.   Bartholomew Crews, MD 07/10/2013, 2:03 PM

## 2013-07-10 NOTE — Progress Notes (Signed)
Pts IV is out because she was suppose to be discharged. MD stated it was ok to leave IV out for now.

## 2013-07-10 NOTE — Progress Notes (Signed)
Spoke with patient and explained that I was unable to set up home PT and OT due to Medicaid guideline restrictions. I asked if she would be able to go to outpatient PT if the MD wanted to order outpatient PT. I explained that they would need financial information and may have to charge her for services. She stated that she was interested. Due to outpatient offices being closed at this time, outpatient PT would have to be set up on 07/11/13 but can be done after d/c if ordered. Patient's RN to contact MD. Fuller Plan RN, BSN, CCM

## 2013-07-10 NOTE — Progress Notes (Signed)
PT note Evaluation completed.  Pt very unsteady on feet. Pt tested and has left vestibular hypofunction.  Will need NHP for vestibular rehab as pt does not have 24 hour care and is high fall risk due to instability.  Exercises initiated.  Will continue PT and full note to follow.  Thanks.  [facility] [phone] (pager)

## 2013-07-10 NOTE — Progress Notes (Signed)
Subjective: Patient complaining of dizziness, which lasts 1-2 mins, on standing. Not present when lying down. Spontaneously resolves. Pt had a bowel movement yesterday, says she had a little blood in her stool "Nothing to call the nurse about".   Objective: Vital signs in last 24 hours: Filed Vitals:   07/09/13 2317 07/10/13 0430 07/10/13 0736 07/10/13 1209  BP: 130/79 114/78 105/51 136/68  Pulse: 61 60 52 66  Temp: 98.4 F (36.9 C) 97.9 F (36.6 C) 97.7 F (36.5 C) 98.6 F (37 C)  TempSrc: Oral Oral Oral Oral  Resp: 16 16 17 18   Height:      Weight:      SpO2: 97% 100% 100% 99%   Weight change:  No intake or output data in the 24 hours ending 07/10/13 1249 GENERAL- alert, co-operative, appears as stated age, not in any distress. HEENT- Atraumatic, normocephalic. CARDIAC- RRR. RESP- No crackle or wheezes appreciated. ABDOMEN- Soft, non tender. NEURO- No obvious Cr N abnormality, strenght upper and lower extremities- 5/5. EXTREMITIES- pulse 2+, symmetric, no pedal edema. SKIN- Warm, dry, No rash or lesion. PSYCH- Normal mood and affect.  Lab Results: Basic Metabolic Panel:  Recent Labs Lab 07/08/13 1440  NA 145  K 4.1  CL 104  CO2 25  GLUCOSE 81  BUN 16  CREATININE 1.01  CALCIUM 9.0   CBC:  Recent Labs Lab 07/08/13 1440 07/09/13 0630 07/09/13 1853  WBC 8.1 5.9 6.0  NEUTROABS 5.4  --   --   HGB 11.1* 10.5* 10.7*  HCT 33.9* 33.3* 33.8*  MCV 86.0 86.9 86.0  PLT 219 184 190     Medications: I have reviewed the patient's current medications. Scheduled Meds: . pantoprazole  40 mg Oral Daily  . sodium chloride  3 mL Intravenous Q12H  . sodium chloride  3 mL Intravenous Q12H   Continuous Infusions:  PRN Meds:.sodium chloride, acetaminophen, acetaminophen, ondansetron, sodium chloride Assessment/Plan:  Lower GI bleed  The patient had a possible lower GI bleed earlier today of unknown etiology. Likely causes are internal hemorrhoids or fissure.  Colonoscopy done 2012- at [facility], showed feats consistent with IBS, but otherwise normal colonoscopy. This was done because pt was enrolled in a study for IBS. Patients hemoglobin is stable. Repeat CBC- 11.1 > 10.5>> 10.7.  - PO PPI   Dizziness-  Pt not orthostatic. CBC stable. Pt taking a weightloss pill, that can possibly cause dizziness but pt says she has been taking this medication for years, and she is reluctant to stop the medication. - PT eval- Concern for vestibular issues, recs- SNF, 24hr supervision. Pt refused.  - Discharge home with PT, will need vestibular rehab.  IBS  The patients IBS is stable at this time.   Normocytic Anemia  Appears stable on admission.   Insomnia  Pt had zofran last night which possibly contributed to her dizziness. D/c zofran.   Diet: Regular Diet   DVT: SCDs  Dispo: Disposition is deferred at this time, awaiting improvement of current medical problems.  Anticipated discharge in approximately 1-2 day(s).   The patient does have a current PCP Jerene Pitch, MD) and does need an [facility] hospital follow-up appointment after discharge.  The patient does not know have transportation limitations that hinder transportation to clinic appointments.  .Services Needed at time of discharge: Y = Yes, Blank = No PT:   OT:   RN:   Equipment:   Other:     LOS: 2 days  Jenetta Downer, MD 07/10/2013, 12:49 PM

## 2013-07-10 NOTE — Progress Notes (Signed)
07/10/13 Received order for HHPT/OT. Checked with [facility] to see if patient's dx would qualify for HHPT/OT per Medicaid guidelines. Per Butch Penny at [facility] the patient's dx does not qualify for HHPT or OT even with vestibular needs. Informed Dr. Aida Raider and patient's RN that patient does not qualify for HHPT/OT.

## 2013-07-10 NOTE — Progress Notes (Signed)
Pt requested medication stringer than tylenol for her headache MD on call paged  Returned page stating that he had informed the pt that he is not ordering anything stronger than what is ordered and that the [facility] team can reevaluate this later this morning. Pt was given this informaton and voiced her understanding and took Tylenol 650mg  as ordered. Arthor Captain LPN

## 2013-07-10 NOTE — Progress Notes (Signed)
Pts tele was removed for discharge. Since Pt will be staying overnight, the on call MD was asked if pt could remain off of telemetry. MD stated it was ok for patient to remain off of tele.

## 2013-07-10 NOTE — Evaluation (Signed)
Physical Therapy Evaluation Patient Details Name: Tamara [name] MRN: [id number] DOB: 19-Jul-1960 Today's Date: 07/10/2013   History of Present Illness  Pt admit for lower GIB.  Pt has developed unsteady gait as well.    Clinical Impression  Pt admitted with above. Pt currently with functional limitations due to the deficits listed below (see PT Problem List).  Concerned about this pts' ability to go home.  Vestibular issues noted.  Will need vestibular rehab.  Could take Meclizine but this would suppress the vestibular system and make treatment less effective.  Will continue PT for vestbular rehab in hospital.    Pt will benefit from skilled PT to increase their independence and safety with mobility to allow discharge to the venue listed below.     Follow Up Recommendations SNF for vestibular rehab;Supervision/Assistance - 24 hour    Equipment Recommendations  None recommended by PT    Recommendations for Other Services       Precautions / Restrictions Precautions Precautions: Fall Restrictions Weight Bearing Restrictions: No      Mobility  Bed Mobility Overal bed mobility: Needs Assistance;+2 for physical assistance Bed Mobility: Supine to Sit     Supine to sit: Min assist     General bed mobility comments: Needed assist secondary to dizziness makes pt unsteady with truncal ataxia.    Transfers Overall transfer level: Needs assistance Equipment used: Rolling walker (2 wheeled);None Transfers: Sit to/from Stand Sit to Stand: Mod assist         General transfer comment: Pt unsteady once up needing steadying assist with overcorrection of balance when unsteady.  Pt loses balance all directions.    Ambulation/Gait Ambulation/Gait assistance: Mod assist Ambulation Distance (Feet): 150 Feet Assistive device: Rolling walker (2 wheeled);1 person hand held assist Gait Pattern/deviations: Step-through pattern;Decreased stride length;Decreased step length - right;Decreased step  length - left;Ataxic;Leaning posteriorly;Staggering left;Staggering right;Wide base of support   Gait velocity interpretation: Below normal speed for age/gender General Gait Details: Pt with ataxic movement patterns at times.  Pt with unsteady gait with and without RW.  Pt at risk ffor falls due to this instability and pt's poor awareness of deficits.    Stairs            Wheelchair Mobility    Modified Rankin (Stroke Patients Only)       Balance Overall balance assessment: Needs assistance;History of Falls Sitting-balance support: Bilateral upper extremity supported;Feet supported Sitting balance-Leahy Scale: Poor Sitting balance - Comments: losing balance posterior at times when pt was doing exercises for vertigo. Postural control: Posterior lean Standing balance support: Bilateral upper extremity supported;During functional activity Standing balance-Leahy Scale: Poor Standing balance comment: Pt needs support for static stance due to posterior lean.  Unsteady due to vertigo.                             Pertinent Vitals/Pain VSS, abdominal pain per pt but not rated.     Home Living Family/patient expects to be discharged to:: Private residence Living Arrangements: Spouse/significant other;Children Available Help at Discharge: Family;Available PRN/intermittently Type of Home: House Home Access: Stairs to enter Entrance Stairs-Rails: None Entrance Stairs-Number of Steps: 2 Home Layout: One level Home Equipment: Walker - 2 wheels;Cane - single point;Bedside commode;Shower seat;Wheelchair - manual;Grab bars - tub/shower;Hand held shower head      Prior Function Level of Independence: Independent               Hand Dominance  Dominant Hand: Right    Extremity/Trunk Assessment   Upper Extremity Assessment: Defer to OT evaluation           Lower Extremity Assessment: Generalized weakness         Communication   Communication: No  difficulties  Cognition Arousal/Alertness: Awake/alert Behavior During Therapy: WFL for tasks assessed/performed Overall Cognitive Status: Impaired/Different from baseline Area of Impairment: Safety/judgement;Awareness;Problem solving         Safety/Judgement: Decreased awareness of safety;Decreased awareness of deficits   Problem Solving: Difficulty sequencing;Requires verbal cues;Decreased initiation;Slow processing General Comments: Pt with very poor safety awareness.      General Comments General comments (skin integrity, edema, etc.): Tested pt for vestibular issues with negative tests for BPPV all canals.  Pt tested positive for left hypofunction via testing.  Initiated x1 exercises with pt able to perform for up to 30 seconds at a time.  Educated pt to perform 3 reps 5 x daily.  Pt agrees.      Exercises Other Exercises Other Exercises: x1 exercises in sitting 3 reps each direction 5 x daily.      Assessment/Plan    PT Assessment Patient needs continued PT services  PT Diagnosis Abnormality of gait   PT Problem List Decreased activity tolerance;Decreased balance;Decreased mobility;Decreased knowledge of use of DME;Decreased safety awareness;Decreased knowledge of precautions (dizziness)  PT Treatment Interventions DME instruction;Gait training;Functional mobility training;Therapeutic activities;Therapeutic exercise;Balance training;Patient/family education (gaze stability and compensation techniques)   PT Goals (Current goals can be found in the Care Plan section) Acute Rehab PT Goals Patient Stated Goal: to go home PT Goal Formulation: With patient Time For Goal Achievement: 07/24/13 Potential to Achieve Goals: Good    Frequency Min 3X/week   Barriers to discharge Decreased caregiver support husband and children work    Co-evaluation               End of Session Equipment Utilized During Treatment: Gait belt Activity Tolerance: Other (comment) (limited by  dizziness) Patient left: in bed;with call bell/phone within reach;with family/visitor present Nurse Communication: Mobility status    Functional Assessment Tool Used: clinical judgment Functional Limitation: Mobility: Walking and moving around Mobility: Walking and Moving Around Current Status [id number]): At least 40 percent but less than 60 percent impaired, limited or restricted Mobility: Walking and Moving Around Goal Status [phone]): At least 1 percent but less than 20 percent impaired, limited or restricted    Time: 0813-0842 PT Time Calculation (min): 29 min   Charges:   PT Evaluation $Initial PT Evaluation Tier I: 1 Procedure PT Treatments $Therapeutic Exercise: 8-22 mins $Self Care/Home Management: 8-22   PT G Codes:   Functional Assessment Tool Used: clinical judgment Functional Limitation: Mobility: Walking and moving around    INGOLD,Azra Abrell 07/10/2013, 11:33 AM [facility] [phone] (pager)

## 2013-07-11 NOTE — Clinical Social Work Psychosocial (Signed)
Clinical Social Work Department BRIEF PSYCHOSOCIAL ASSESSMENT 07/11/2013  Patient:  Tamara [name],Tamara [name]     Account Number:  [id number]     Admit date:  07/08/2013  Clinical Social Worker:  , BRYANT, LCSWA  Date/Time:  07/11/2013 09:40 AM  Referred by:  Physician  Date Referred:  07/11/2013 Referred for  SNF Placement   Other Referral:   Interview type:  Patient Other interview type:   Patient alert and oriented at time of assessment.    PSYCHOSOCIAL DATA Living Status:  HUSBAND Admitted from facility:   Level of care:   Primary support name:  Tamara [name] Primary support relationship to patient:  SPOUSE Degree of support available:   Support is good.    CURRENT CONCERNS Current Concerns  Post-Acute Placement   Other Concerns:    SOCIAL WORK ASSESSMENT / PLAN CSW notes from chart that patient refused to be discharged home the day before despite refusing SNF multiple times to medical team. CSW met with patient at bedside to complete assessment. Patient is stating that she is agreeable to SNF placement and never refused to go. She said that "there must have been miscommunication because I never said I wouldn't go, I just didn't think I could go because I didn't have insurance." CSW explained to patient LOG bed search process and explained that we could attempt to find her a bed at SNF and that she would be discharged to the first available bed. CSW explained that a LOG for 14 days has been approved and that she will bee discharged home from the SNF she goes to after the 14 days has ended Patient stated that she was agreeable to this. Lastly, CSW explained that patient is medically stable for DC and would therefore be discharged today.  Patient went on to explain the troubles she has had with losing her orange card and wanted assistance with getting this (CSW and RNCM collaborated to help patient and discovered that patient does not qualify for the orange card). Patient seems  overwhelmed by her current circumstances and reflects on how much her life has changed over the recent months. 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   Assessment/plan status:  Psychosocial Support/Ongoing Assessment of Needs Other assessment/ plan:   Complete FL2, Fax, PASRR   Information/referral to community resources:   CSW contact information given to patient.    PATIENT'S/FAMILY'S RESPONSE TO PLAN OF CARE: Patient is agreeable to LOG SNF seach/placement. Patient was pleasant and engaged in assessment. CSW will follow up with any available bed offers and assist with DC.       Bryant  MSW, LCSWA, LCASA, [phone] 

## 2013-07-11 NOTE — Progress Notes (Signed)
All arrangements for discharge to SNF made for patient, after pt refused discharge home and elected to go to SNF yesterday. SW and CM made this possible. Pt now refusing to go to SNF. Pt has capacity, can make her own decisions, and is stable for discharge. Patient can go home as she has elected to.  Bing Neighbors, MD. PGY-1.

## 2013-07-11 NOTE — Progress Notes (Signed)
MD notified of patient's refusal to go to SNF.

## 2013-07-11 NOTE — Progress Notes (Addendum)
   Subjective: Pt decided against going home yesterday, saying she initially though she was going home with Physical therapy, since she wasn't going to a SNF. Also says she wanted a refferal to GI on discharge. Pt was scheduled to follow up with [facility] in 10 days- 07/11/2013, with subsequent refferal to GI, pending [employer]. Pt says this appointment was too far out and is concerned something could happen as regards bleeding before then.   Objective: Vital signs in last 24 hours: Filed Vitals:   07/10/13 0736 07/10/13 1209 07/10/13 2124 07/11/13 0533  BP: 105/51 136/68 154/73 105/69  Pulse: 52 66 66 61  Temp: 97.7 F (36.5 C) 98.6 F (37 C) 97.9 F (36.6 C) 98.4 F (36.9 C)  TempSrc: Oral Oral Oral Oral  Resp: 17 18 18 18   Height:      Weight:      SpO2: 100% 99% 100% 99%   Weight change:  No intake or output data in the 24 hours ending 07/11/13 1052  Physical Exam-  GENERAL- alert, co-operative, lying in bed, NAD HEENT- Atraumatic, normocephalic. CARDIAC- RRR. RESP- No crackles or wheezes appreciated. ABDOMEN- Soft, non tender. NEURO- No obvious Cr N Abnormality. EXTREMITIES- No pedal edema.  Lab Results: Basic Metabolic Panel:  Recent Labs Lab 07/08/13 1440  NA 145  K 4.1  CL 104  CO2 25  GLUCOSE 81  BUN 16  CREATININE 1.01  CALCIUM 9.0   CBC:  Recent Labs Lab 07/08/13 1440 07/09/13 0630 07/09/13 1853  WBC 8.1 5.9 6.0  NEUTROABS 5.4  --   --   HGB 11.1* 10.5* 10.7*  HCT 33.9* 33.3* 33.8*  MCV 86.0 86.9 86.0  PLT 219 184 190     Medications: I have reviewed the patient's current medications. Scheduled Meds: . pantoprazole  40 mg Oral Daily  . sodium chloride  3 mL Intravenous Q12H  . sodium chloride  3 mL Intravenous Q12H   Continuous Infusions:  PRN Meds:.sodium chloride, acetaminophen, acetaminophen, ondansetron, sodium chloride Assessment/Plan:  Lower GI bleed -Patients Vitals and hemoglobin is stable. CBC- 11.1 > 10.5>>  10.7(07/09/2013) . Pt reports having some streaks, No more episodes since admission.  - Discharge to SNF today.  Dizziness-  Pt not orthostatic. Present on standing. CBC stable. Pt taking a weightloss pill, that can possibly cause dizziness but pt says she has been taking this medication for years, and she is reluctant to stop the medication.  - PT eval- Concern for vestibular issues, recs- SNF, 24hr supervision. Pt initially refused, later agreed.  - Consider meclizine in the future. - Discharge to SNF today, will need vestibular rehab.  IBS  The patients IBS is stable at this time.   Normocytic Anemia  Appears stable on admission.   Insomnia  D/c zofran.   Diet: Regular Diet   DVT: SCDs  Dispo: Disposition is deferred at this time, awaiting improvement of current medical problems.  Anticipated discharge in approximately 1-2 day(s).   The patient does have a current PCP Jerene Pitch, MD) and does need an [facility] hospital follow-up appointment after discharge.  The patient does not know have transportation limitations that hinder transportation to clinic appointments.  .Services Needed at time of discharge: Y = Yes, Blank = No PT:   OT:   RN:   Equipment:   Other:     LOS: 3 days   Jenetta Downer, MD 07/11/2013, 10:52 AM

## 2013-07-11 NOTE — Progress Notes (Signed)
Tamara [name] to be D/C'd Home per MD order.  Discussed with the patient and all questions fully answered.     Medication List    STOP taking these medications       ibuprofen 200 MG tablet  Commonly known as:  ADVIL,MOTRIN     OVER THE COUNTER MEDICATION     phentermine 37.5 MG capsule      TAKE these medications       ACAI BERRY PO  Take 1 capsule by mouth daily as needed (for vitamin).     b complex vitamins tablet  Take 1 tablet by mouth daily.     cholecalciferol 400 UNITS Tabs tablet  Commonly known as:  VITAMIN D  Take 400 Units by mouth daily.     Fish Oil 1000 MG Caps  Take 1,000-2,000 mg by mouth 2 (two) times daily. Take 2 capsules in the morning and take 1 capsule at bedtime     GARCINIA CAMBOGIA-CHROMIUM PO  Take 1 capsule by mouth daily as needed (for vitamin).     multivitamin tablet  Take 1 tablet by mouth daily.     SYSTANE 0.4-0.3 % Soln  Generic drug:  Polyethyl Glycol-Propyl Glycol  Place 1 drop into both eyes daily as needed (for dry eyes).     Vicks Vaporub 4.73-1.2-2.6 % Oint  Apply 1 application topically daily as needed (for feet).     vitamin E 100 UNIT capsule  Take 100 Units by mouth daily.        VVS, Skin clean, dry and intact without evidence of skin break down, no evidence of skin tears noted. IV catheter discontinued intact. Site without signs and symptoms of complications. Dressing and pressure applied.  An After Visit Summary was printed and given to the patient. Education provided by [employer].   D/c education completed with patient/family including follow up instructions, medication list, d/c activities limitations if indicated, with other d/c instructions as indicated by MD - patient able to verbalize understanding, all questions fully answered.   Patient instructed to return to ED, call 911, or call MD for any changes in condition.   Patient escorted via [place], and D/C home via private auto.  Delman Cheadle 07/11/2013 7:48 PM

## 2013-07-11 NOTE — Progress Notes (Signed)
CSW updated on pt's case by [facility] and Therapist, sports. Discussed discharge option of home with no support vs. LOG to [place]. After describing SNF, pt decided to go home. RN in room when pt made decision. CSW signing off.   Ky Barban, MSW, [place] Clinical Social Worker

## 2013-07-11 NOTE — Clinical Social Work Note (Signed)
Per MD patient ready for DC to [facility]. Patient, RN, facility, and daughter aware of DC. Daughter to transport patient this afternoon. DC pack on chart. Full assessment and placement note to come.   Liz Beach MSW, [place], [place], [phone]

## 2013-07-11 NOTE — Progress Notes (Signed)
RN called to bedside to clarify Patient's concerns for after visit appointments and orange card. RN spoke with Case Manager who gave instructions for patient to pick up orange card after d/c from hospital and it could be picked up by 5pm from [facility], [place]. RN contacted MD on phone while in patient's room to clarify instructions with patient. MD gave instructions for patient to make follow up appointment with [facility] once obtaining orange card. When information shared with patient, patient appeared to grow agitated and stated, "this is not what I had been told earlier". MD stated they had provided this information earlier in the day. Pt requested to speak with Director of Nursing. Director of Nursing as well as Surveyor, quantity of unit brought to bedside to help answer questions and concerns for patient.   3:00pm: Patient stated "It's more important for me to go home and get my insurance in place" and that she refused to go to SNF after d/c. Team provided education to patient and expressed concerns for safety at home. Patient still refused SNF placement at this time.

## 2013-07-11 NOTE — Progress Notes (Signed)
  Date: 07/11/2013  Patient name: Tamara [name]  Medical record number: [id number]  Date of birth: Jun 28, 1960   This patient has been seen and the plan of care was discussed with the house staff. Please see their note for complete details. I concur with their findings with the following additions/corrections: Dr Rosalva Ferron and I spoke to the pt at 8 AM today. She again verbalizes concern over her hospital tx.  1. BRBPR - we again explained the difference btw emergency needing inpt GI W/U and a non emergency which can safely be observed as an outpt. She remains concerned that we did not look inside her to determin the cause of her bleeding. She was not orthostatic, her vials are stable, and her HgB is stable. She is also upset about not having a GI appt set. I explained that in non emergencies, we cannot sch GI appt until her orange card is in her hand. I explained she may pay out of pocket and get an appt ASAP although that is cost prohibitive. She remains concerned.   2. Dizziness - She is now willing to go to SNF. Plans to be transferred today.  3. Insurance issues - this is beyond my scope. I explained that the orange card must be in her hand to receive many of the services that she needs / wants / desires. I explained that i have never seen the application completed by the inpt teams.   She remains unhappy with the care we have provided but I am not certain what I can do which is medically appropriate for her to satisfy her concerns.   We spent 30 min at the bedside.   Bartholomew Crews, MD 07/11/2013, 2:41 PM

## 2013-07-11 NOTE — Progress Notes (Signed)
Physical Therapy Treatment Patient Details Name: Tamara [name] MRN: [id number] DOB: 1960/08/22 Today's Date: 07/11/2013    History of Present Illness Pt admit for lower GIB.  Pt has developed unsteady gait as well.      PT Comments    Patient seems better in terms of mobility with getting into/out of bed and for transfers.  Still very off balance and unable to maintain compensatory techniques to keep balance despite cues.  Continue to feel she cannot go home without capable 24 hour assist (states her spouse recuperating from heart surgery and takes lots of meds that keep him knocked out at night.)  Discussed plan to get referral to neurologist as outpatient due to concern for h/o abnormality on MRI in 2010 that she saw neurologist for at [facility] and was supposed to follow up with them but did not.  Gait seems also limited by fungus causing pain left foot and needs treatment for that.  Follow Up Recommendations  SNF;Supervision/Assistance - 24 hour     Equipment Recommendations  None recommended by PT    Recommendations for Other Services       Precautions / Restrictions Precautions Precautions: Fall    Mobility  Bed Mobility   Bed Mobility: Supine to Sit     Supine to sit: Modified independent (Device/Increase time)        Transfers Overall transfer level: Modified independent Equipment used: None Transfers: Sit to/from Stand Sit to Stand: Modified independent (Device/Increase time)            Ambulation/Gait Ambulation/Gait assistance: Min assist;Mod assist Ambulation Distance (Feet): 100 Feet Assistive device: None Gait Pattern/deviations: Step-to pattern;Decreased stance time - left;Decreased step length - right     General Gait Details: reports fungus on left toes and walking on outside of left foot at times and demonstrates decreased step length on right due to painful left foot.  Initially up to bathroom patient moving sideways and holding onto sink with  minguard, then in hallway with "here comes the bride" gait pattern and reaching for wall rail at times.  usually loss of balance left and needs max cues for visual target due to easily distracted looking to sides with increased  balance difficulty and dizziness despite cues to focus straight ahead.   Stairs            Wheelchair Mobility    Modified Rankin (Stroke Patients Only)       Balance Overall balance assessment: History of Falls;Needs assistance           Standing balance-Leahy Scale: Poor Standing balance comment: can stand holding support with supervision                    Cognition Arousal/Alertness: Awake/alert Behavior During Therapy: WFL for tasks assessed/performed             Safety/Judgement: Decreased awareness of safety;Decreased awareness of deficits   Problem Solving: Requires verbal cues      Exercises Other Exercises Other Exercises: performed seated VOR training with large target held in hand at arms length;  needed cues for duration of activity, rest between repititions and slower head movements to avoid loss of balance sitting and letter out of focus.    General Comments General comments (skin integrity, edema, etc.): coordination testing WNL, LE strength Right 3+ to 4-/5 hip flexion and knee extension and ankle DF able to hold against resistance briefly then falls (impersistant muscle tone); left 3/5 unable to hold to resistance with  knee extension and hip flexion 3-/5 cannot lift fully against gravity      Pertinent Vitals/Pain 10/10 with gait left foot    Home Living                      Prior Function            PT Goals (current goals can now be found in the care plan section) Progress towards PT goals: Progressing toward goals    Frequency  Min 3X/week    PT Plan Current plan remains appropriate    Co-evaluation             End of Session Equipment Utilized During Treatment: Gait  belt Activity Tolerance: Patient tolerated treatment well Patient left: in bed;with call bell/phone within reach;with bed alarm set     Time: 1025-1111 PT Time Calculation (min): 46 min  Charges:  $Gait Training: 8-22 mins $Neuromuscular Re-education: 8-22 mins $Self Care/Home Management: 8-22                    G Codes:      WYNN,CYNDI 08/01/13, 11:23 AM Tamara [name], [facility] 08/01/2013

## 2013-07-11 NOTE — Discharge Instructions (Signed)
Gastrointestinal Bleeding (dark tarry stools with clots) Gastrointestinal (GI) bleeding means there is bleeding somewhere along the digestive tract, between the mouth and anus. CAUSES  There are many different problems that can cause GI bleeding. Possible causes include:  Esophagitis. This is inflammation, irritation, or swelling of the esophagus.  Hemorrhoids.These are veins that are full of blood (engorged) in the rectum. They cause pain, inflammation, and may bleed.  Anal fissures.These are areas of painful tearing which may bleed. They are often caused by passing hard stool.  Diverticulosis.These are pouches that form on the colon over time, with age, and may bleed significantly.  Diverticulitis.This is inflammation in areas with diverticulosis. It can cause pain, fever, and bloody stools, although bleeding is rare.  Polyps and cancer. Colon cancer often starts out as precancerous polyps.  Gastritis and ulcers.Bleeding from the upper gastrointestinal tract (near the stomach) may travel through the intestines and produce black, sometimes tarry, often bad smelling stools. In certain cases, if the bleeding is fast enough, the stools may not be black, but red. This condition may be life-threatening. SYMPTOMS   Vomiting bright red blood or material that looks like coffee grounds.  Bloody, black, or tarry stools. DIAGNOSIS  Your caregiver may diagnose your condition by taking your history and performing a physical exam. More tests may be needed, including:  X-rays and other imaging tests.  Esophagogastroduodenoscopy (EGD). This test uses a flexible, lighted tube to look at your esophagus, stomach, and small intestine.  Colonoscopy. This test uses a flexible, lighted tube to look at your colon. TREATMENT  Treatment depends on the cause of your bleeding.   For bleeding from the esophagus, stomach, small intestine, or colon, the caregiver doing your EGD or colonoscopy may be  able to stop the bleeding as part of the procedure.  Inflammation or infection of the colon can be treated with medicines.  Many rectal problems can be treated with creams, suppositories, or warm baths.  Surgery is sometimes needed.  Blood transfusions are sometimes needed if you have lost a lot of blood. If bleeding is slow, you may be allowed to go home. If there is a lot of bleeding, you will need to stay in the hospital for observation. HOME CARE INSTRUCTIONS   Take any medicines exactly as prescribed.  Keep your stools soft by eating foods that are high in fiber. These foods include whole grains, legumes, fruits, and vegetables. Prunes (1 to 3 a day) work well for many people.  Drink enough fluids to keep your urine clear or pale yellow. SEEK IMMEDIATE MEDICAL CARE IF:   Your bleeding increases.  You feel lightheaded, weak, or you faint.  You have severe cramps in your back or abdomen.  You pass large blood clots in your stool.  Your problems are getting worse. MAKE SURE YOU:   Understand these instructions.  Will watch your condition.  Will get help right away if you are not doing well or get worse. Document Released: 01/14/2000 Document Revised: 01/03/2012 Document Reviewed: 12/26/2010 [facility] Patient Information 2014 [place], [place].  Melena (dark tarry stools) Bloody stools means there is blood in your poop (stool). It is a sign that there is a problem somewhere in the digestive system. It is important for your doctor to find the cause of your bleeding, so the problem can be treated.  HOME CARE  Only take medicine as told by your doctor.  Eat foods with fiber (prunes, bran cereals).  Drink enough fluids to keep your  pee (urine) clear or pale yellow.  Sit in warm water (sitz bath) for 10 to 15 minutes as told by your doctor.  Know how to take your medicines (enemas, suppositories) if advised by your doctor.  Watch for signs that you are getting better or  getting worse. GET HELP RIGHT AWAY IF:   You are not getting better.  You start to get better but then get worse again.  You have new problems.  You have severe bleeding from the place where poop comes out (rectum) that does not stop.  You throw up (vomit) blood.  You feel weak or pass out (faint).  You have a fever. MAKE SURE YOU:   Understand these instructions.  Will watch your condition.  Will get help right away if you are not doing well or get worse. Document Released: 01/04/2009 Document Revised: 04/10/2011 Document Reviewed: 06/03/2010 [facility] Patient Information 2014 [place].

## 2013-07-11 NOTE — Progress Notes (Signed)
I saw and evaluated the patient.  I personally confirmed the key portions of the history and exam documented by Dr. Rabbani and I reviewed pertinent patient test results.  The assessment, diagnosis, and plan were formulated together and I agree with the documentation in the resident's note.  

## 2013-07-11 NOTE — Clinical Social Work Placement (Signed)
Clinical Social Work Department CLINICAL SOCIAL WORK PLACEMENT NOTE 07/11/2013  Patient:  Tamara [name],Tamara [name]  Account Number:  [id number] Admit date:  07/08/2013  Clinical Social Worker:  Kemper Durie, [place]  Date/time:  07/11/2013 10:00 AM  Clinical Social Work is seeking post-discharge placement for this patient at the following level of care:   [facility]   (*CSW will update this form in Epic as items are completed)   07/11/2013  Patient/family provided with [facility] Department of Clinical Social Work's list of facilities offering this level of care within the geographic area requested by the patient (or if unable, by the patient's family).  07/11/2013  Patient/family informed of their freedom to choose among providers that offer the needed level of care, that participate in Medicare, Medicaid or managed care program needed by the patient, have an available bed and are willing to accept the patient.  07/11/2013  Patient/family informed of [facility]' ownership interest in [facility], as well as of the fact that they are under no obligation to receive care at this facility.  PASARR submitted to EDS on 07/11/2013 PASARR number received on 07/11/2013  FL2 transmitted to all facilities in geographic area requested by pt/family on  07/11/2013 FL2 transmitted to all facilities within larger geographic area on   Patient informed that his/her managed care company has contracts with or will negotiate with  certain facilities, including the following:     Patient/family informed of bed offers received:  07/11/2013 Patient chooses bed at [facility] Physician recommends and patient chooses bed at    Patient to be transferred to [facility] on  07/11/2013 Patient to be transferred to facility by Daughter's personal vehicle Patient and family notified of transfer on 07/11/2013 Name of family member notified:  Patient  notified and stated her daughter is transporting her  The following physician request were entered in Epic:   Additional Comments: Patient chose [facility] from options.   UPDATE 2:30PM: Facility's admission coordinator contacted CSW and stated that she had contacted patient to speak with her about her admission to [facility]. Patient told admission coordinator that she was not coming and would be going home. CSW met with patient at bedside. Patient states that she will not go to SNF and will go home. CSW inquired about her reasons for deciding to go home. Patient said, "I'll be depressed there and I need to get insurance because I don't qualify for the orange card. I'm trying to get some now through the exchange." CSW explained that discharge to SNF would be the safest option, but patient continued to refuse. CSW asked patient what the difference would between going home or going to SNF as she could still obtain insurance while she is at [facility]. Patient continued to repeat that she was not going to SNF. CSW explained that patient's refusal to go to SNF means that she will be discharged home. Patient verbalized understanding and wishes to go home. CSW updated Therapist, sports, AD, and RNCM. CSW signing off at this time.

## 2013-07-21 ENCOUNTER — Ambulatory Visit (INDEPENDENT_AMBULATORY_CARE_PROVIDER_SITE_OTHER): Payer: Self-pay | Admitting: Internal Medicine

## 2013-07-21 ENCOUNTER — Encounter: Payer: Self-pay | Admitting: Internal Medicine

## 2013-07-21 VITALS — BP 134/87 | HR 81 | Temp 97.4°F | Ht 63.0 in | Wt 214.0 lb

## 2013-07-21 DIAGNOSIS — K589 Irritable bowel syndrome without diarrhea: Secondary | ICD-10-CM

## 2013-07-21 DIAGNOSIS — K219 Gastro-esophageal reflux disease without esophagitis: Secondary | ICD-10-CM | POA: Insufficient documentation

## 2013-07-21 DIAGNOSIS — R42 Dizziness and giddiness: Secondary | ICD-10-CM

## 2013-07-21 DIAGNOSIS — K625 Hemorrhage of anus and rectum: Secondary | ICD-10-CM

## 2013-07-21 DIAGNOSIS — D649 Anemia, unspecified: Secondary | ICD-10-CM

## 2013-07-21 DIAGNOSIS — K2100 Gastro-esophageal reflux disease with esophagitis, without bleeding: Secondary | ICD-10-CM

## 2013-07-21 DIAGNOSIS — R51 Headache: Secondary | ICD-10-CM

## 2013-07-21 DIAGNOSIS — R519 Headache, unspecified: Secondary | ICD-10-CM | POA: Insufficient documentation

## 2013-07-21 LAB — CBC
HEMATOCRIT: 35.7 % — AB (ref 36.0–46.0)
Hemoglobin: 11.9 g/dL — ABNORMAL LOW (ref 12.0–15.0)
MCH: 27.6 pg (ref 26.0–34.0)
MCHC: 33.3 g/dL (ref 30.0–36.0)
MCV: 82.8 fL (ref 78.0–100.0)
Platelets: 198 10*3/uL (ref 150–400)
RBC: 4.31 MIL/uL (ref 3.87–5.11)
RDW: 15.1 % (ref 11.5–15.5)
WBC: 6.6 10*3/uL (ref 4.0–10.5)

## 2013-07-21 NOTE — Progress Notes (Signed)
Patient ID: Tamara [name], female   DOB: 08/22/1960, 53 y.o.   MRN: [id number]   Subjective:   Patient ID: Tamara [name] female   DOB: Oct 05, 1960 53 y.o.   MRN: [id number]  HPI: [name] is a 53 y.o. woman with past medical history of IBS and hysterectomy in 2008 who presents for hospital follow-up visit.   Pt was hospitalized from 6/9 - 6/12 for bright red blood per rectum and lightheadedness. Pt reports that since leaving the hospital she has had 6 bowel movements and four have been red-brown in color in addition to blood with wiping. She denies painful BM and reports no diarrhea, constipation, distension, nausea, or vomiting.  She continues to have lower abdominal cramping that comes and goes since last office visit on 6/9 which she describes as  "colon spasms." She has not used any NSAID's or aspirin since discharge. She would like referral to GI for colonoscopy.   Her "dizzy spells"  which she describes as lightheadedness have improved since discharge and reports she is ambulating without difficulty. She denies vertigo, imbalance, weakness, paraesthesias, speech/vision change, syncope, dyspnea, chest pain, or palpitations.   She reports having a tension-type headache from stress without aura, photophobia, neck pain/stiffness, or vision changes. She has a history of migraines but has not had one since 2008. She has been taking OTC tylenol with no relief.    Her IBS has been well-controlled and her phentermine was discontinued at discharge with no recent weight change. She has been drinking herbal tea.     Past Medical History  Diagnosis Date  . IBS (irritable bowel syndrome)     at age of 5  . Sciatica 2009  . Anemia    Current Outpatient Prescriptions  Medication Sig Dispense Refill  . ACAI BERRY PO Take 1 capsule by mouth daily as needed (for vitamin).      Marland Kitchen b complex vitamins tablet Take 1 tablet by mouth daily.      . Camphor-Eucalyptus-Menthol (VICKS VAPORUB) 4.73-1.2-2.6 %  OINT Apply 1 application topically daily as needed (for feet).      . cholecalciferol (VITAMIN D) 400 UNITS TABS tablet Take 400 Units by mouth daily.       Marland Kitchen GARCINIA CAMBOGIA-CHROMIUM PO Take 1 capsule by mouth daily as needed (for vitamin).      . Multiple Vitamin (MULTIVITAMIN) tablet Take 1 tablet by mouth daily.      . Omega-3 Fatty Acids (FISH OIL) 1000 MG CAPS Take 1,000-2,000 mg by mouth 2 (two) times daily. Take 2 capsules in the morning and take 1 capsule at bedtime      . Polyethyl Glycol-Propyl Glycol (SYSTANE) 0.4-0.3 % SOLN Place 1 drop into both eyes daily as needed (for dry eyes).      . vitamin E 100 UNIT capsule Take 100 Units by mouth daily.       No current facility-administered medications for this visit.   Family History  Problem Relation Age of Onset  . Cancer Father   . Hypertension Brother    History   Social History  . Marital Status: Married    Spouse Name: N/A    Number of Children: N/A  . Years of Education: N/A   Social History Main Topics  . Smoking status: Never Smoker   . Smokeless tobacco: Never Used  . Alcohol Use: No  . Drug Use: No  . Sexual Activity: No   Other Topics Concern  . Not on file   Social History  Narrative  . No narrative on file   Review of Systems: Review of Systems  Constitutional: Negative for fever, chills and weight loss.  HENT: Positive for hearing loss (chronic). Negative for congestion, sore throat and tinnitus.   Eyes: Negative for blurred vision.  Respiratory: Negative for cough, shortness of breath and wheezing.   Cardiovascular: Positive for leg swelling (trace b/l LE). Negative for chest pain and palpitations.  Gastrointestinal: Positive for heartburn (possibly), abdominal pain (lower abdominal cramping ) and blood in stool. Negative for nausea, vomiting, diarrhea, constipation and melena.  Genitourinary: Negative for dysuria, urgency, frequency and hematuria.  Neurological: Positive for dizziness (improved)  and headaches. Negative for sensory change, speech change, focal weakness, loss of consciousness and weakness.    Objective:  Physical Exam: Filed Vitals:   07/21/13 1410  BP: 134/87  Pulse: 81  Temp: 97.4 F (36.3 C)  TempSrc: Oral  Height: 5\' 3"  (1.6 m)  Weight: 214 lb (97.07 kg)  SpO2: 99%    Physical Exam  Constitutional: She is oriented to person, place, and time. She appears well-developed and well-nourished. No distress.  HENT:  Head: Normocephalic and atraumatic.  Right Ear: External ear normal.  Left Ear: External ear normal.  Nose: Nose normal.  Mouth/Throat: Oropharynx is clear and moist. No oropharyngeal exudate.  Eyes: Conjunctivae and EOM are normal. Pupils are equal, round, and reactive to light. Right eye exhibits no discharge. Left eye exhibits no discharge.  Neck: Normal range of motion. Neck supple.  Cardiovascular: Normal rate, regular rhythm and normal heart sounds.   Pulmonary/Chest: Effort normal and breath sounds normal. No respiratory distress. She has no wheezes. She has no rales.  Abdominal: Soft. Bowel sounds are normal. She exhibits no distension. There is tenderness (mild lower abdominal). There is no rebound and no guarding.  Musculoskeletal: Normal range of motion. She exhibits no edema and no tenderness.  Neurological: She is alert and oriented to person, place, and time.  Skin: Skin is warm and dry. No rash noted. She is not diaphoretic. No erythema. No pallor.  Psychiatric: She has a normal mood and affect. Her behavior is normal. Judgment and thought content normal.    Assessment & Plan:   Please see problem list for problem-based assessment and plan

## 2013-07-21 NOTE — Assessment & Plan Note (Addendum)
Assessment: Pt with recent lightheadedness in setting of GI bleeding with MRI brain on 07/28/08 with nonspecific white matter changes who presents with improved symptoms and no recent syncope, falls, or gait instability.   Plan: -Continue to monitor -Consider referral to neurology if continues to persist -Consider repeat MRI brain

## 2013-07-21 NOTE — Assessment & Plan Note (Addendum)
Assessment: Pt with history of IBS and normal colonoscopy in 2013 with BRBPR and possible dark stools of 2 week duration with unknown etiology who presents with no hemodynamic instability.    Plan:   -Referral to GI at [facility] CBC ---> stable H/H 11.9/35.7 -Continue to avoid NSAID use

## 2013-07-21 NOTE — Assessment & Plan Note (Signed)
Assessment: Pt with history of migraine headaches with last episode in 2008 who presents with tension-type headache with no alarm symptoms.   Plan: -Continue acetaminophen PRN pain -Avoid NSAID use in setting of GI bleed -Continue to monitor

## 2013-07-21 NOTE — Assessment & Plan Note (Addendum)
Assessment: Pt s/p hysterectomy due to menorrhagia from uterine fibroids in 2008 with history of chronic normocytic anemia since 2010 (baseline Hg 11) with BRBPR of 2 week duration of unknown etiology who presents with no hemodynamic instability.   Plan:  -Obtain CBC --> stable H/H 11.9/35.7 -Obtain anemia panel ---> elevated B12 level (1497) -Referral to GI for further work-up

## 2013-07-21 NOTE — Assessment & Plan Note (Signed)
Assessment: Pt with well-controlled IBS with diet modification with no recent flare or unintentional weight loss recently discontinued on phentermine.   Plan:  -Continue current diet

## 2013-07-21 NOTE — Patient Instructions (Signed)
-  Will refer you to [facility] -Will check your blood-work today -Take tums or prilosec for acid reflux  -Avoid OTC advil, ibuprofen, motrin for pain, take tylenol insatead -Will see you back as needed    Gastroesophageal Reflux Disease, Adult Gastroesophageal reflux disease (GERD) happens when acid from your stomach goes into your food pipe (esophagus). The acid can cause a burning feeling in your chest. Over time, the acid can make small holes (ulcers) in your food pipe.  HOME CARE  Ask your doctor for advice about:  Losing weight.  Quitting smoking.  Alcohol use.  Avoid foods and drinks that make your problems worse. You may want to avoid:  Caffeine and alcohol.  Chocolate.  Mints.  Garlic and onions.  Spicy foods.  Citrus fruits, such as oranges, lemons, or limes.  Foods that contain tomato, such as sauce, chili, salsa, and pizza.  Fried and fatty foods.  Avoid lying down for 3 hours before you go to bed or before you take a nap.  Eat small meals often, instead of large meals.  Wear loose-fitting clothing. Do not wear anything tight around your waist.  Raise (elevate) the head of your bed 6 to 8 inches with wood blocks. Using extra pillows does not help.  Only take medicines as told by your doctor.  Do not take aspirin or ibuprofen. GET HELP RIGHT AWAY IF:   You have pain in your arms, neck, jaw, teeth, or back.  Your pain gets worse or changes.  You feel sick to your stomach (nauseous), throw up (vomit), or sweat (diaphoresis).  You feel short of breath, or you pass out (faint).  Your throw up is green, yellow, black, or looks like coffee grounds or blood.  Your poop (stool) is red, bloody, or black. MAKE SURE YOU:   Understand these instructions.  Will watch your condition.  Will get help right away if you are not doing well or get worse. Document Released: 07/05/2007 Document Revised: 04/10/2011 Document Reviewed: 08/05/2010 [facility]  Patient Information 2015 [place], [place]. This information is not intended to replace advice given to you by your health care provider. Make sure you discuss any questions you have with your health care provider.   General Instructions:   Please bring your medicines with you each time you come to clinic.  Medicines may include prescription medications, over-the-counter medications, herbal remedies, eye drops, vitamins, or other pills.   Progress Toward Treatment Goals:  No flowsheet data found.  Self Care Goals & Plans:  No flowsheet data found.  No flowsheet data found.   Care Management & Community Referrals:  No flowsheet data found.

## 2013-07-21 NOTE — Assessment & Plan Note (Addendum)
Assessment: Pt with acid reflux symptoms during hospitalization relieved with PPI therapy.   Plan:  -Pt instructed to take OTC prilosec or antacid

## 2013-07-22 LAB — ANEMIA PANEL
%SAT: 22 % (ref 20–55)
ABS RETIC: 43.1 10*3/uL (ref 19.0–186.0)
FERRITIN: 69 ng/mL (ref 10–291)
Folate: >20 ng/mL
Iron: 65 ug/dL (ref 42–145)
RBC.: 4.31 MIL/uL (ref 3.87–5.11)
Retic Ct Pct: 1 % (ref 0.4–2.3)
TIBC: 290 ug/dL (ref 250–470)
UIBC: 225 ug/dL (ref 125–400)
Vitamin B-12: 1497 pg/mL — ABNORMAL HIGH (ref 211–911)

## 2013-07-22 NOTE — Progress Notes (Signed)
Case discussed with Dr. Naaman Plummer at the time of the visit.  We reviewed the resident's history and exam and pertinent patient test results.  I agree with the assessment, diagnosis, and plan of care documented in the resident's note. Ms Ratcliffe''s household income is too high to qualify for the orange card. Therefore, referrals will be challenging. There is no urgency to the referrals thankfully. Check CBC to assess HgB since pt reports ongoing GI blood loss. Pt was able to walk into clinic independently as pt had reported inability to walk unaided at hospital D/C.

## 2013-12-01 ENCOUNTER — Encounter: Payer: Self-pay | Admitting: Internal Medicine

## 2014-01-15 ENCOUNTER — Ambulatory Visit: Payer: Self-pay | Admitting: Internal Medicine

## 2014-01-15 ENCOUNTER — Encounter: Payer: Self-pay | Admitting: Internal Medicine

## 2014-01-15 ENCOUNTER — Ambulatory Visit (INDEPENDENT_AMBULATORY_CARE_PROVIDER_SITE_OTHER): Payer: Self-pay | Admitting: Internal Medicine

## 2014-01-15 VITALS — BP 117/88 | HR 74 | Temp 98.1°F | Ht 63.0 in | Wt 221.7 lb

## 2014-01-15 DIAGNOSIS — Z23 Encounter for immunization: Secondary | ICD-10-CM

## 2014-01-15 DIAGNOSIS — M5432 Sciatica, left side: Secondary | ICD-10-CM

## 2014-01-15 DIAGNOSIS — K589 Irritable bowel syndrome without diarrhea: Secondary | ICD-10-CM

## 2014-01-15 DIAGNOSIS — M5387 Other specified dorsopathies, lumbosacral region: Secondary | ICD-10-CM | POA: Insufficient documentation

## 2014-01-15 DIAGNOSIS — R609 Edema, unspecified: Secondary | ICD-10-CM

## 2014-01-15 DIAGNOSIS — K625 Hemorrhage of anus and rectum: Secondary | ICD-10-CM

## 2014-01-15 MED ORDER — TRAMADOL HCL 50 MG PO TABS: 50.0000 mg | ORAL_TABLET | Freq: Four times a day (QID) | ORAL | Status: AC | PRN

## 2014-01-15 MED ORDER — FUROSEMIDE 20 MG PO TABS: 20.0000 mg | ORAL_TABLET | Freq: Every day | ORAL | Status: DC | PRN

## 2014-01-15 NOTE — Assessment & Plan Note (Signed)
Assessment: Patient complains of worsening lower extremity edema bilaterally, but worst on left. She also admits to swelling in her hands. This is a chronic intermittent issue for her. It has been managed conservatively in the past, but also has been given furosemide for it as well. Patient had a normal echo in 2007 (per patient). No shortness of breath or fatigue. After physical exam, patient most likely had dependent edema secondary to vascular insufficieny versus lymphatic disruption from previous ankle surgeries and pelvic surgeries. We doubt CHF secondary to young age, no shortness of breath or fatigue. Lungs are clear to ausculation bilaterally, no crackles. We also doubt DVT as edema is bilateral, Homan's sign negative and no erythema, tenderness, or increased warmth over the calves bilaterally. Patient is also an active younger individual. She denies chest pain or shortness of breath.  Plan: -Furosemide 20 mg daily as needed -Compression Stockings  Next visit: -Consider echo for baseline; however, doubt cardiac involvement -Recheck BMET for potassium at follow up visit -If edema persists or worsens, patient may need to be considered for vascular surgery referral in future -If concern for DVT arises, consider LE Dopplers (not indicated at this time)

## 2014-01-15 NOTE — Progress Notes (Signed)
Internal Medicine Clinic Attending  I saw and evaluated the patient.  I personally confirmed the key portions of the history and exam documented by Dr. Richardson and I reviewed pertinent patient test results.  The assessment, diagnosis, and plan were formulated together and I agree with the documentation in the resident's note. 

## 2014-01-15 NOTE — Progress Notes (Signed)
   Subjective:    Patient ID: Tamara [name], female    DOB: Jan 23, 1961, 53 y.o.   MRN: [id number]  HPI Ms. Kallam is a 53 yo female with PMHx of IBS, GERD, BrBPR, and normocytic anemia who presents for follow up. Please see problem oriented assessment and plan for more information.  Review of Systems General: Denies fever, chills, fatigue Respiratory: Denies SOB, cough, DOE, chest tightness, and wheezing  Cardiovascular: Denies chest pain and palpitations.  Gastrointestinal: Denies nausea, vomiting, blood in stool and abdominal distention.  Genitourinary: Denies dysuria, urgency, frequency Musculoskeletal: Admits to sciatic pain radiating into left leg (chronic) and lower extremity swelling and hand swelling bilaterally. Denies myalgias, back pain, joint swelling, arthralgias and gait problem.  Skin: Denies pallor, rash and wounds.  Neurological: Denies dizziness, headaches, weakness, lightheadedness Psychiatric/Behavioral: Denies mood changes, confusion, nervousness, sleep disturbance and agitation.  Past Medical History  Diagnosis Date  . IBS (irritable bowel syndrome)     at age of 6  . Sciatica 2009  . Anemia    Outpatient Encounter Prescriptions as of 01/15/2014  Medication Sig  . ACAI BERRY PO Take 1 capsule by mouth daily as needed (for vitamin).  Marland Kitchen b complex vitamins tablet Take 1 tablet by mouth daily.  . Camphor-Eucalyptus-Menthol (VICKS VAPORUB) 4.73-1.2-2.6 % OINT Apply 1 application topically daily as needed (for feet).  . cholecalciferol (VITAMIN D) 400 UNITS TABS tablet Take 400 Units by mouth daily.   . furosemide (LASIX) 20 MG tablet Take 1 tablet (20 mg total) by mouth daily as needed for fluid or edema.  Marland Kitchen GARCINIA CAMBOGIA-CHROMIUM PO Take 1 capsule by mouth daily as needed (for vitamin).  . Multiple Vitamin (MULTIVITAMIN) tablet Take 1 tablet by mouth daily.  . Omega-3 Fatty Acids (FISH OIL) 1000 MG CAPS Take 1,000-2,000 mg by mouth 2 (two) times daily. Take 2  capsules in the morning and take 1 capsule at bedtime  . Polyethyl Glycol-Propyl Glycol (SYSTANE) 0.4-0.3 % SOLN Place 1 drop into both eyes daily as needed (for dry eyes).  . traMADol (ULTRAM) 50 MG tablet Take 1 tablet (50 mg total) by mouth every 6 (six) hours as needed.  . vitamin E 100 UNIT capsule Take 100 Units by mouth daily.      Objective:   Physical Exam Filed Vitals:   01/15/14 1438  BP: 117/88  Pulse: 74  Temp: 98.1 F (36.7 C)  TempSrc: Oral  Height: 5\' 3"  (1.6 m)  Weight: 221 lb 11.2 oz (100.562 kg)  SpO2: 100%   General: Vital signs reviewed.  Patient is well-developed and well-nourished, in no acute distress and cooperative with exam.  Cardiovascular: RRR, S1 normal, S2 normal, no murmurs, gallops, or rubs. Pulmonary/Chest: Clear to auscultation bilaterally, no wheezes, rales, or rhonchi. Abdominal: Soft, non-tender, non-distended, BS + Musculoskeletal: No joint deformities, erythema, or stiffness, ROM full and nontender. Extremities: 1+ pitting edema in right lower extremity and 1+ non-pitting edema in left lower extremity, minimal hand edema. Pulses symmetric and intact bilaterally. No cyanosis or clubbing. Negative Homan's sign. No increased warmth or erythema in calves.  Neurological: Sensory intact to light touch bilaterally.  Skin: Warm, dry and intact. No rashes or erythema. Psychiatric: Normal mood and affect. speech and behavior is normal. Cognition and memory are normal.     Assessment & Plan:   Please see problem based assessment and plan.

## 2014-01-15 NOTE — Patient Instructions (Signed)
General Instructions:   Please bring your medicines with you each time you come to clinic.  Medicines may include prescription medications, over-the-counter medications, herbal remedies, eye drops, vitamins, or other pills.   FOR YOUR SCIATICA: -Try Tramadol 50 mg 1 pill every 6 hours as needed for pain -When you obtain your insurance, it would be beneficial for you to see physical therapy and at that time we can also prescribe Gabapentin at a low dose at night  FOR YOUR SWELLING: -Pick up "Compression Stockings" from a local pharmacy or [employer] -Try to keep your legs up when possible -Use Furosemide 20 mg 1 pill daily as needed for swelling  Peripheral Edema You have swelling in your legs (peripheral edema). This swelling is due to excess accumulation of salt and water in your body. Edema may be a sign of heart, kidney or liver disease, or a side effect of a medication. It may also be due to problems in the leg veins. Elevating your legs and using special support stockings may be very helpful, if the cause of the swelling is due to poor venous circulation. Avoid long periods of standing, whatever the cause. Treatment of edema depends on identifying the cause. Chips, pretzels, pickles and other salty foods should be avoided. Restricting salt in your diet is almost always needed. Water pills (diuretics) are often used to remove the excess salt and water from your body via urine. These medicines prevent the kidney from reabsorbing sodium. This increases urine flow. Diuretic treatment may also result in lowering of potassium levels in your body. Potassium supplements may be needed if you have to use diuretics daily. Daily weights can help you keep track of your progress in clearing your edema. You should call your caregiver for follow up care as recommended. SEEK IMMEDIATE MEDICAL CARE IF:   You have increased swelling, pain, redness, or heat in your legs.  You develop shortness of breath,  especially when lying down.  You develop chest or abdominal pain, weakness, or fainting.  You have a fever. Document Released: 02/24/2004 Document Revised: 04/10/2011 Document Reviewed: 02/03/2009 [facility] Patient Information 2015 [place], [place]. This information is not intended to replace advice given to you by your health care provider. Make sure you discuss any questions you have with your health care provider.   Sciatica Sciatica is pain, weakness, numbness, or tingling along the path of the sciatic nerve. The nerve starts in the lower back and runs down the back of each leg. The nerve controls the muscles in the lower leg and in the back of the knee, while also providing sensation to the back of the thigh, lower leg, and the sole of your foot. Sciatica is a symptom of another medical condition. For instance, nerve damage or certain conditions, such as a herniated disk or bone spur on the spine, pinch or put pressure on the sciatic nerve. This causes the pain, weakness, or other sensations normally associated with sciatica. Generally, sciatica only affects one side of the body. CAUSES   Herniated or slipped disc.  Degenerative disk disease.  A pain disorder involving the narrow muscle in the buttocks (piriformis syndrome).  Pelvic injury or fracture.  Pregnancy.  Tumor (rare). SYMPTOMS  Symptoms can vary from mild to very severe. The symptoms usually travel from the low back to the buttocks and down the back of the leg. Symptoms can include:  Mild tingling or dull aches in the lower back, leg, or hip.  Numbness in the back of the  calf or sole of the foot.  Burning sensations in the lower back, leg, or hip.  Sharp pains in the lower back, leg, or hip.  Leg weakness.  Severe back pain inhibiting movement. These symptoms may get worse with coughing, sneezing, laughing, or prolonged sitting or standing. Also, being overweight may worsen symptoms. DIAGNOSIS  Your caregiver  will perform a physical exam to look for common symptoms of sciatica. He or she may ask you to do certain movements or activities that would trigger sciatic nerve pain. Other tests may be performed to find the cause of the sciatica. These may include:  Blood tests.  X-rays.  Imaging tests, such as an MRI or CT scan. TREATMENT  Treatment is directed at the cause of the sciatic pain. Sometimes, treatment is not necessary and the pain and discomfort goes away on its own. If treatment is needed, your caregiver may suggest:  Over-the-counter medicines to relieve pain.  Prescription medicines, such as anti-inflammatory medicine, muscle relaxants, or narcotics.  Applying heat or ice to the painful area.  Steroid injections to lessen pain, irritation, and inflammation around the nerve.  Reducing activity during periods of pain.  Exercising and stretching to strengthen your abdomen and improve flexibility of your spine. Your caregiver may suggest losing weight if the extra weight makes the back pain worse.  Physical therapy.  Surgery to eliminate what is pressing or pinching the nerve, such as a bone spur or part of a herniated disk. HOME CARE INSTRUCTIONS   Only take over-the-counter or prescription medicines for pain or discomfort as directed by your caregiver.  Apply ice to the affected area for 20 minutes, 3-4 times a day for the first 48-72 hours. Then try heat in the same way.  Exercise, stretch, or perform your usual activities if these do not aggravate your pain.  Attend physical therapy sessions as directed by your caregiver.  Keep all follow-up appointments as directed by your caregiver.  Do not wear high heels or shoes that do not provide proper support.  Check your mattress to see if it is too soft. A firm mattress may lessen your pain and discomfort. SEEK IMMEDIATE MEDICAL CARE IF:   You lose control of your bowel or bladder (incontinence).  You have increasing  weakness in the lower back, pelvis, buttocks, or legs.  You have redness or swelling of your back.  You have a burning sensation when you urinate.  You have pain that gets worse when you lie down or awakens you at night.  Your pain is worse than you have experienced in the past.  Your pain is lasting longer than 4 weeks.  You are suddenly losing weight without reason. MAKE SURE YOU:  Understand these instructions.  Will watch your condition.  Will get help right away if you are not doing well or get worse. Document Released: 01/10/2001 Document Revised: 07/18/2011 Document Reviewed: 05/28/2011 [facility] Patient Information 2015 [place], [place]. This information is not intended to replace advice given to you by your health care provider. Make sure you discuss any questions you have with your health care provider.
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
## 2014-01-15 NOTE — Assessment & Plan Note (Signed)
Well managed with diet.  -Consider GI referral at follow up visit when patient has insurance

## 2014-01-15 NOTE — Assessment & Plan Note (Signed)
Assessment: Previous history of clots in stool in June 2015. No source was found. Patient could not be referred to GI due to insurance status. She denies any more melena or hematochezia. She does use NSAIDs occasionally and I counseled her on the dangers of this.  Plan: -Consider GI referral at follow up visit when patient has insurance

## 2014-02-23 ENCOUNTER — Ambulatory Visit (INDEPENDENT_AMBULATORY_CARE_PROVIDER_SITE_OTHER): Payer: 59 | Admitting: Internal Medicine

## 2014-02-23 ENCOUNTER — Encounter: Payer: Self-pay | Admitting: Internal Medicine

## 2014-02-23 VITALS — BP 124/71 | HR 84 | Temp 98.3°F | Ht 63.0 in | Wt 225.7 lb

## 2014-02-23 DIAGNOSIS — R195 Other fecal abnormalities: Secondary | ICD-10-CM

## 2014-02-23 DIAGNOSIS — H6690 Otitis media, unspecified, unspecified ear: Secondary | ICD-10-CM | POA: Insufficient documentation

## 2014-02-23 DIAGNOSIS — R609 Edema, unspecified: Secondary | ICD-10-CM

## 2014-02-23 DIAGNOSIS — M5432 Sciatica, left side: Secondary | ICD-10-CM

## 2014-02-23 DIAGNOSIS — B9789 Other viral agents as the cause of diseases classified elsewhere: Secondary | ICD-10-CM

## 2014-02-23 DIAGNOSIS — J069 Acute upper respiratory infection, unspecified: Secondary | ICD-10-CM

## 2014-02-23 MED ORDER — CHLORPHENIRAMINE MALEATE 4 MG PO TABS: 4.0000 mg | ORAL_TABLET | Freq: Four times a day (QID) | ORAL | Status: DC | PRN

## 2014-02-23 MED ORDER — DM-GUAIFENESIN ER 30-600 MG PO TB12: 1.0000 | ORAL_TABLET | Freq: Two times a day (BID) | ORAL | Status: DC | PRN

## 2014-02-23 MED ORDER — HEXYLRESORCINOL 2.4 MG MT LOZG: 1.0000 | LOZENGE | OROMUCOSAL | Status: DC | PRN

## 2014-02-23 NOTE — Assessment & Plan Note (Addendum)
History of GI bleed back in June. She was referred to GI, but she never went shouldn't have insurance. She is continue to have occasional dark colored stools. She has not had her CBC repeated since her hospitalization. She had a colonoscopy which was normal in 2012. She also has a history of IBS and has followed with GI in the past. -Referred to GI for workup of GI bleeding. -CBC today.  --Addendum-- Arman Filter, MD, PhD Internal Medicine Intern Pager: [phone] 04/17/2014,1:41 PM  Patient did not have labs drawn on way out. -Consider collecting labs next appointment.

## 2014-02-23 NOTE — Assessment & Plan Note (Addendum)
Lower extremity edema has been improved since taking Lasix occasionally. I agree this is most likely secondary to venous insufficiency with history of normal echocardiogram previously and no other cardiac symptoms. -Check BMP today given Lasix use and diarrhea. -Asked patient to refrain from using Lasix until her diarrhea resolves. -Can continue using Lasix 20 mg daily as needed at that time. -Could consider repeat echocardiogram in future.  --Addendum-- Arman Filter, MD, PhD Internal Medicine Intern Pager: [phone] 04/17/2014,1:43 PM  Patient did not have labs collected prior to leaving clinic. -Consider collecting labs at next clinic appointment.

## 2014-02-23 NOTE — Progress Notes (Signed)
Internal Medicine Clinic Attending  Case discussed with Dr. Moding at the time of the visit.  We reviewed the resident's history and exam and pertinent patient test results.  I agree with the assessment, diagnosis, and plan of care documented in the resident's note. 

## 2014-02-23 NOTE — Assessment & Plan Note (Signed)
Back pain unchanged from prior. She would benefit from some rehabilitation with physical therapy. She says that tramadol helps for her back pain. In addition she continues to use NSAIDs occasionally. She is reluctant to restart gabapentin given her previous experience using this medication. -Referred to physical therapy. -Advised patient to use Tylenol before using NSAIDs due to the risk of bleeding associated with these medications. -Continue tramadol 50 mg every 6 hours as needed.

## 2014-02-23 NOTE — Progress Notes (Signed)
   Subjective:    Patient ID: Tamara [name], female    DOB: Jun 25, 1960, 54 y.o.   MRN: [id number]  HPI Tamara [name] is a 54 year old woman with a history of IBS, GERD, and normocytic anemia presenting with flu-like symptoms.  She was seen in clinic on 01/15/2014 for her chronic sciatic nerve pain. She was prescribed tramadol 50 mg every 6 hours as needed for pain. She also complained of worsening bilateral lower extremity edema for which she has been taking furosemide 20 mg daily as needed. She was hospitalized for GI bleeding in June 2015. She was referred to GI, however she has been able to go due to a lack of insurance.  She reports developing a sore throat three days ago associated with subjective fevers, chills, and diarrhea.  She has been congested with green mucus coming from her nose.  She also has body aches, myalgias, and weakness.  She has been having loose bowel movements twice a day.  She has been gargling in chloroseptic to help with her throat pain.  She has also been using theraflu, which has helped to some extent, but she has continued to have symptoms.  She says that her friend has the flu, and she came into contact with him last week.  She says her back pain is unchanged from prior and she continues to have swelling in her feet.  Her feet and legs continue to burn.  She has been taking Lasix 20 mg about 3 times per week since her appointment in December, which helps the swelling in her feet.  She denies any recent bleeding in her bowel movements, but she has occasionally had some darker stools. She continues to take ibuprofen occasionally for her back pain, and she knows to limit this because it can cause bleeding.  She recently enrolled in insurance and brought her insurance card today.  Review of Systems  Constitutional: Positive for fever, chills and diaphoresis.  HENT: Positive for congestion, rhinorrhea and sore throat.   Eyes: Negative for visual disturbance.  Respiratory:  Positive for cough. Negative for shortness of breath.   Cardiovascular: Positive for leg swelling. Negative for chest pain and palpitations.  Gastrointestinal: Positive for diarrhea. Negative for nausea, vomiting, abdominal pain, constipation and anal bleeding.  Genitourinary: Negative for difficulty urinating.  Musculoskeletal: Positive for myalgias, back pain and arthralgias.  Skin: Negative for rash.  Neurological: Positive for weakness (Generalized.) and numbness (Associated with sciatica.). Negative for dizziness.  Hematological: Negative for adenopathy.       Objective:   Physical Exam  Constitutional: She is oriented to person, place, and time. She appears well-developed and well-nourished. No distress.  HENT:  Head: Normocephalic and atraumatic.  Mouth/Throat: Oropharyngeal exudate present.  S/p tonsillectomy.  Eyes: Conjunctivae and EOM are normal. Pupils are equal, round, and reactive to light. No scleral icterus.  Cardiovascular: Normal rate, regular rhythm and normal heart sounds.   Pulmonary/Chest: Effort normal and breath sounds normal. No respiratory distress. She has no wheezes.  Abdominal: Soft. Bowel sounds are normal. She exhibits no distension. There is no tenderness.  Musculoskeletal: Normal range of motion. She exhibits edema (Trace edema around ankles bilaterally.).  Neurological: She is alert and oriented to person, place, and time. No cranial nerve deficit. She exhibits normal muscle tone.  Skin: Skin is warm and dry. No rash noted. No erythema.          Assessment & Plan:  Please see problem-based assessment and plan.

## 2014-02-23 NOTE — Assessment & Plan Note (Addendum)
Symptoms most consistent with a common cold. No headache or fever currently. Does complain of muscle and body aches, however symptoms have been going on for greater than 48 hours. As result, she would not be a candidate for Tamiflu anyways as she has no other significant comorbidities. We'll treat symptomatically.  Diarrhea likely associated with viral infection. -Chlorpheniramine 4 mg every 6 hours as needed. -Sucrets lozenges for sore throat. -Mucinex DM as needed for cough.

## 2014-02-23 NOTE — Patient Instructions (Addendum)
Thank you for coming to clinic today [name].  General instructions: -Your symptoms are most consistent with a common cold. -I prescribed some medications to help you with her symptoms.  Chorpheniramine can make you drowsy, so I recommend taking it when you are going to sleep. -If you don't feel better in the next week please return to clinic. -Make sure to drink lots of water until your diarrhea resolves.  Don't take your Lasix until your diarrhea has improved. -I referred you to GI and physical therapy. We will contact your with these appointments. -I would like you to stop by the lab on your way out.  I will let your know if anything is abnormal. -Please make a follow up appointment to return to clinic in 3 months.  Please bring your medicines with you each time you come.   Medicines may be  Eye drops  Herbal   Vitamins  Pills  Seeing these help [place] take care of you.  Upper Respiratory Infection, Adult An upper respiratory infection (URI) is also sometimes known as the common cold. The upper respiratory tract includes the nose, sinuses, throat, trachea, and bronchi. Bronchi are the airways leading to the lungs. Most people improve within 1 week, but symptoms can last up to 2 weeks. A residual cough may last even longer.  CAUSES Many different viruses can infect the tissues lining the upper respiratory tract. The tissues become irritated and inflamed and often become very moist. Mucus production is also common. A cold is contagious. You can easily spread the virus to others by oral contact. This includes kissing, sharing a glass, coughing, or sneezing. Touching your mouth or nose and then touching a surface, which is then touched by another person, can also spread the virus. SYMPTOMS  Symptoms typically develop 1 to 3 days after you come in contact with a cold virus. Symptoms vary from person to person. They may include:  Runny nose.  Sneezing.  Nasal congestion.  Sinus  irritation.  Sore throat.  Loss of voice (laryngitis).  Cough.  Fatigue.  Muscle aches.  Loss of appetite.  Headache.  Low-grade fever. DIAGNOSIS  You might diagnose your own cold based on familiar symptoms, since most people get a cold 2 to 3 times a year. Your caregiver can confirm this based on your exam. Most importantly, your caregiver can check that your symptoms are not due to another disease such as strep throat, sinusitis, pneumonia, asthma, or epiglottitis. Blood tests, throat tests, and X-rays are not necessary to diagnose a common cold, but they may sometimes be helpful in excluding other more serious diseases. Your caregiver will decide if any further tests are required. RISKS AND COMPLICATIONS  You may be at risk for a more severe case of the common cold if you smoke cigarettes, have chronic heart disease (such as heart failure) or lung disease (such as asthma), or if you have a weakened immune system. The very young and very old are also at risk for more serious infections. Bacterial sinusitis, middle ear infections, and bacterial pneumonia can complicate the common cold. The common cold can worsen asthma and chronic obstructive pulmonary disease (COPD). Sometimes, these complications can require emergency medical care and may be life-threatening. PREVENTION  The best way to protect against getting a cold is to practice good hygiene. Avoid oral or hand contact with people with cold symptoms. Wash your hands often if contact occurs. There is no clear evidence that vitamin C, vitamin E, echinacea, or exercise  reduces the chance of developing a cold. However, it is always recommended to get plenty of rest and practice good nutrition. TREATMENT  Treatment is directed at relieving symptoms. There is no cure. Antibiotics are not effective, because the infection is caused by a virus, not by bacteria. Treatment may include:  Increased fluid intake. Sports drinks offer valuable  electrolytes, sugars, and fluids.  Breathing heated mist or steam (vaporizer or shower).  Eating chicken soup or other clear broths, and maintaining good nutrition.  Getting plenty of rest.  Using gargles or lozenges for comfort.  Controlling fevers with ibuprofen or acetaminophen as directed by your caregiver.  Increasing usage of your inhaler if you have asthma. Zinc gel and zinc lozenges, taken in the first 24 hours of the common cold, can shorten the duration and lessen the severity of symptoms. Pain medicines may help with fever, muscle aches, and throat pain. A variety of non-prescription medicines are available to treat congestion and runny nose. Your caregiver can make recommendations and may suggest nasal or lung inhalers for other symptoms.  HOME CARE INSTRUCTIONS   Only take over-the-counter or prescription medicines for pain, discomfort, or fever as directed by your caregiver.  Use a warm mist humidifier or inhale steam from a shower to increase air moisture. This may keep secretions moist and make it easier to breathe.  Drink enough water and fluids to keep your urine clear or pale yellow.  Rest as needed.  Return to work when your temperature has returned to normal or as your caregiver advises. You may need to stay home longer to avoid infecting others. You can also use a face mask and careful hand washing to prevent spread of the virus. SEEK MEDICAL CARE IF:   After the first few days, you feel you are getting worse rather than better.  You need your caregiver's advice about medicines to control symptoms.  You develop chills, worsening shortness of breath, or brown or red sputum. These may be signs of pneumonia.  You develop yellow or brown nasal discharge or pain in the face, especially when you bend forward. These may be signs of sinusitis.  You develop a fever, swollen neck glands, pain with swallowing, or white areas in the back of your throat. These may be signs  of strep throat. SEEK IMMEDIATE MEDICAL CARE IF:   You have a fever.  You develop severe or persistent headache, ear pain, sinus pain, or chest pain.  You develop wheezing, a prolonged cough, cough up blood, or have a change in your usual mucus (if you have chronic lung disease).  You develop sore muscles or a stiff neck. Document Released: 07/12/2000 Document Revised: 04/10/2011 Document Reviewed: 04/23/2013 [facility] Patient Information 2015 [place], [place]. This information is not intended to replace advice given to you by your health care provider. Make sure you discuss any questions you have with your health care provider.

## 2014-02-24 NOTE — Addendum Note (Signed)
Addended by: Charlesetta Shanks on: 02/24/2014 10:03 AM   Modules accepted: Miquel Dunn

## 2014-02-25 ENCOUNTER — Encounter: Payer: Self-pay | Admitting: Gastroenterology

## 2014-03-03 ENCOUNTER — Ambulatory Visit: Payer: 59 | Admitting: Physical Therapy

## 2014-04-06 ENCOUNTER — Encounter: Payer: Self-pay | Admitting: Gastroenterology

## 2014-04-06 ENCOUNTER — Ambulatory Visit (INDEPENDENT_AMBULATORY_CARE_PROVIDER_SITE_OTHER): Payer: 59 | Admitting: Gastroenterology

## 2014-04-06 VITALS — BP 122/80 | HR 64 | Ht 63.0 in | Wt 221.0 lb

## 2014-04-06 DIAGNOSIS — R109 Unspecified abdominal pain: Secondary | ICD-10-CM

## 2014-04-06 DIAGNOSIS — K625 Hemorrhage of anus and rectum: Secondary | ICD-10-CM

## 2014-04-06 MED ORDER — MOVIPREP 100 G PO SOLR: 1.0000 | Freq: Once | ORAL | Status: DC

## 2014-04-06 NOTE — Assessment & Plan Note (Signed)
She has had persistent rectal bleeding over the past 9 months.  This is in distinction to her prior history of diarrhea alone.  Accordingly, she will be scheduled for colonoscopy.  Bleeding certainly may be hemorrhoidal but a more proximal bleeding source should be ruled out.

## 2014-04-06 NOTE — Patient Instructions (Signed)

## 2014-04-06 NOTE — Assessment & Plan Note (Signed)
Several year history of diarrhea accompanied by abdominal pain suggestive of IBS.  There are no specific food triggers.  Microscopic colitis should be ruled out.  Recommendations #1 will consider therapy with Viberzi if no colonic abnormalities are seen

## 2014-04-06 NOTE — Assessment & Plan Note (Signed)
Asian has occasional pyrosis but no alarms features.  Symptoms mainly occur with dietary indiscretion.  On need for further workup at this time.

## 2014-04-06 NOTE — Progress Notes (Signed)
_                                                                                                                History of Present Illness:  Tamara [name] is a pleasant 54 year old Afro-American female referred from the internal medicine clinic for evaluation of rectal bleeding and abdominal pain.  Has had IBS since she's a teenager.  Several days out of the week she has severe diarrhea consisting of multiple bouts of watery diarrhea a copy by crampy abdominal pai.  She will awaken at night with diarrhea as well.  During these episodes she is bloated.  Over the past 9 months she is been suffering from rectal bleeding.  In June, 2015 she had a bowl full of just blood followed by blood mixed with stools.  Since that time she has had frequent small of the blood mixed with the stools and in the water.  She denies rectal pain.  In 2012 she underwent colonoscopy that was normal.   Past Medical History  Diagnosis Date  . IBS (irritable bowel syndrome)     at age of 44  . Sciatica 2009  . Anemia   . Bowel obstruction    Past Surgical History  Procedure Laterality Date  . Knee arthroscopy  2010 and 2011    Left knee, x2  . Abdominal hysterectomy  2008    cervix and right ovary still intact   family history includes Cancer in her father; Gallbladder disease in her father, paternal grandmother, and sister; Hypertension in her brother. There is no history of Colon cancer, Colon polyps, Esophageal cancer, or Kidney disease. Current Outpatient Prescriptions  Medication Sig Dispense Refill  . ACAI BERRY PO Take 1 capsule by mouth daily as needed (for vitamin).    Marland Kitchen b complex vitamins tablet Take 1 tablet by mouth daily.    . Camphor-Eucalyptus-Menthol (VICKS VAPORUB) 4.73-1.2-2.6 % OINT Apply 1 application topically daily as needed (for feet).    . chlorpheniramine (CHLORPHEN) 4 MG tablet Take 1 tablet (4 mg total) by mouth every 6 (six) hours as needed for allergies. 14 tablet 0  .  cholecalciferol (VITAMIN D) 400 UNITS TABS tablet Take 400 Units by mouth daily.     Marland Kitchen dextromethorphan-guaiFENesin (MUCINEX DM) 30-600 MG per 12 hr tablet Take 1 tablet by mouth 2 (two) times daily as needed for cough. 30 tablet 0  . furosemide (LASIX) 20 MG tablet Take 1 tablet (20 mg total) by mouth daily as needed for fluid or edema. 30 tablet 3  . GARCINIA CAMBOGIA-CHROMIUM PO Take 1 capsule by mouth daily as needed (for vitamin).    . Multiple Vitamin (MULTIVITAMIN) tablet Take 1 tablet by mouth daily.    . Omega-3 Fatty Acids (FISH OIL) 1000 MG CAPS Take 1,000-2,000 mg by mouth 2 (two) times daily. Take 2 capsules in the morning and take 1 capsule at bedtime    . Polyethyl Glycol-Propyl Glycol (SYSTANE) 0.4-0.3 % SOLN Place 1 drop into both eyes daily as  needed (for dry eyes).    . Potassium 75 MG TABS Take 1 tablet by mouth as needed.    . traMADol (ULTRAM) 50 MG tablet Take 1 tablet (50 mg total) by mouth every 6 (six) hours as needed. 30 tablet 0  . UNABLE TO FIND Pt takes 1/2 drop,, by mouth, with meals. Pt is taking Cal Slim Vitamin Drops (green tea, rasberry,african mango)    . vitamin E 100 UNIT capsule Take 100 Units by mouth daily.     No current facility-administered medications for this visit.   Allergies as of 04/06/2014 - Review Complete 04/06/2014  Allergen Reaction Noted  . Codeine Other (See Comments) 06/19/2012  . Dilaudid [hydromorphone hcl] Swelling 06/19/2012  . Paxil [paroxetine hcl] Other (See Comments) 06/19/2012  . Adhesive [tape] Hives, Itching, and Rash 07/09/2013  . Augmentin [amoxicillin-pot clavulanate] Rash 06/19/2012    reports that she has never smoked. She has never used smokeless tobacco. She reports that she does not drink alcohol or use illicit drugs.   Review of Systems: Pertinent positive and negative review of systems were noted in the above HPI section. All other review of systems were otherwise negative.  Vital signs were reviewed in today's  medical record Physical Exam: General: Well developed , well nourished, no acute distress Skin: anicteric Head: Normocephalic and atraumatic Eyes:  sclerae anicteric, EOMI Ears: Normal auditory acuity Mouth: No deformity or lesions Neck: Supple, no masses or thyromegaly Lungs: Clear throughout to auscultation Heart: Regular rate and rhythm; no murmurs, rubs or bruits Abdomen: Soft, non tender and non distended. No masses, hepatosplenomegaly or hernias noted. Normal Bowel sounds Rectal:deferred Musculoskeletal: Symmetrical with no gross deformities  Skin: No lesions on visible extremities Pulses:  Normal pulses noted Extremities: No clubbing, cyanosis, edema or deformities noted Neurological: Alert oriented x 4, grossly nonfocal Cervical Nodes:  No significant cervical adenopathy Inguinal Nodes: No significant inguinal adenopathy Psychological:  Alert and cooperative. Normal mood and affect  See Assessment and Plan under Problem List

## 2014-04-15 ENCOUNTER — Encounter: Payer: 59 | Admitting: Gastroenterology

## 2014-04-17 NOTE — Addendum Note (Signed)
Addended by: Charlesetta Shanks on: 04/17/2014 01:43 PM   Modules accepted: Orders, SmartSet

## 2014-05-18 ENCOUNTER — Encounter: Payer: Self-pay | Admitting: *Deleted

## 2014-05-19 ENCOUNTER — Ambulatory Visit: Payer: 59 | Admitting: Internal Medicine

## 2014-05-19 ENCOUNTER — Telehealth: Payer: Self-pay | Admitting: *Deleted

## 2014-05-19 NOTE — Telephone Encounter (Signed)
Pt was late for office appt - unable to see due to sch per Dr Alice Rieger. . Pt c/o of vag rash and itching. Denies vag discharge. Recently on antibiotics by dentist and steroids. Appt made 05/22/14 2:15PM Dr Alice Rieger. Suggest to stop powder in those areas and use corn starch. Hilda Blades Rich Paprocki RN 05/19/14 3:30PM

## 2014-05-21 ENCOUNTER — Ambulatory Visit: Payer: 59 | Admitting: Internal Medicine

## 2014-05-22 ENCOUNTER — Encounter: Payer: Self-pay | Admitting: Internal Medicine

## 2014-05-22 ENCOUNTER — Ambulatory Visit (INDEPENDENT_AMBULATORY_CARE_PROVIDER_SITE_OTHER): Payer: 59 | Admitting: Internal Medicine

## 2014-05-22 VITALS — BP 123/71 | HR 66 | Temp 98.4°F | Ht 63.0 in | Wt 225.1 lb

## 2014-05-22 DIAGNOSIS — B356 Tinea cruris: Secondary | ICD-10-CM

## 2014-05-22 DIAGNOSIS — N898 Other specified noninflammatory disorders of vagina: Secondary | ICD-10-CM | POA: Diagnosis not present

## 2014-05-22 MED ORDER — MICONAZOLE NITRATE 2 % EX AERP: INHALATION_SPRAY | CUTANEOUS | Status: DC

## 2014-05-22 NOTE — Assessment & Plan Note (Signed)
Assessment: Most likely diagnosis is tinea cruris based on physical examination findings of areas of discrimination bilaterally in her groin areas. Other differentials including eczema.  Plan: 1. Labs/imaging: none 2. Therapy: Miconazole powder 2% apply twice daily for 7-10 days 3. Follow up: when necessary

## 2014-05-22 NOTE — Progress Notes (Signed)
Patient ID: Esbeidy Mclaine, female   DOB: 1960-03-01, 54 y.o.   MRN: [id number]   Subjective:   HPI: Ms.Bradleigh Dhami is a 54 y.o.   Reason(s) for visit:  Vaginal irritation: Patient states that she has been experiencing vaginal irritation over the last 4 days. She denies increased vaginal discharge, vaginal bleeding, or any urinary symptoms. She also reports irritation in the groin area bilaterally. She has not exposed herself to any unusual chemical contacts in the genital area. She is not currently sexually active. She is married with her husband of many years and he is suffering from a heart problems, so they have not had sexual activity for more than a year. She denies fevers, chills, or increased fatigue. She states that most recently she had some kind of allergic reaction to Prednisone, which was prescribed by her dentist. She states that a week ago, she went to the dentist for some dental work and she was given a prescription of Prednisone and Amoxicillin. Several hours after taking the first few pills of prednisone, she developed sore throat, generalized hot flushing and some skin itching. This improved with Benadryl. Her dentist stopped the  Prednisone but recommended that she continues the Amoxil. She has tolerated amoxicillin okay and she is taking her last dose tonight.   Past Medical History  Diagnosis Date  . IBS (irritable bowel syndrome)     at age of 30  . Sciatica 2009  . Anemia   . Bowel obstruction     ROS: Constitutional:  Denies fevers, chills, diaphoresis, appetite change and fatigue.  Respiratory: Denies SOB, DOE, cough, chest tightness, and wheezing.  CVS: No chest pain, palpitations and leg swelling.  GI: No abdominal pain, nausea, vomiting, bloody stools MSK: No myalgias, back pain, joint swelling, arthralgias  Psych: No depression symptoms. No SI or SA.    Objective:  Physical Exam: Filed Vitals:   05/22/14 1502  BP: 123/71  Pulse: 66  Temp: 98.4 F (36.9  C)  TempSrc: Oral  Height: 5\' 3"  (1.6 m)  Weight: 225 lb 1.6 oz (102.105 kg)  SpO2: 100%   General: Well nourished. No acute distress.  HEENT: Normal oral mucosa. MMM.  Lungs: CTA bilaterally. No wheezing. Heart: RRR; no extra sounds or murmurs  Abdomen: Non-distended, normal bowel sounds, soft, nontender; no hepatosplenomegaly Genital exam: Some bilateral groin disclamation without rashes. Vulva and vagina wall appear normal, without signs of erythema. Vagina is well moist. No increased vaginal discharge. Only mild tenderness on introduction of the speculum.  Extremities: No pedal edema. No joint swelling or tenderness. Neurologic: Normal EOM,  Alert and oriented x3. No obvious neurologic/cranial nerve deficits.  Assessment & Plan:  Discussed case with Dr Lynnae January. See problem based charting for assessment and plan.

## 2014-05-22 NOTE — Patient Instructions (Signed)
Please use Miconazole powder to the groin twice a day for 7-10 day  I will call you with results  Please take OTC benadryl for itching

## 2014-05-22 NOTE — Assessment & Plan Note (Signed)
Assessment: Most likely diagnosis is vaginal irritation probably secondary to reaction to her recent medications (prednisone and amoxicillin). Exam without impressive findings to suggest inflammation. I do not suspect bacterial vaginosis. I did not suspect STI's, but I'll proceed to do with prior. She has no signs or symptoms of UTI.   Plan: 1. Labs/imaging: Wet prep 2. Therapy: Recommended Benadryl for vaginal irritation. Given her information from up-to-date on treatment of vaginitis including using sitz baths. 3. Follow up: If further workup prep reveals any infection, will treat. Otherwise, she can follow-up when necessary

## 2014-05-22 NOTE — Progress Notes (Signed)
Internal Medicine Clinic Attending  Case discussed with Dr. Kazibwe soon after the resident saw the patient.  We reviewed the resident's history and exam and pertinent patient test results.  I agree with the assessment, diagnosis, and plan of care documented in the resident's note. 

## 2014-05-26 LAB — CERVICOVAGINAL ANCILLARY ONLY: Wet Prep (BD Affirm): NEGATIVE

## 2014-06-11 ENCOUNTER — Encounter: Payer: Self-pay | Admitting: *Deleted

## 2014-12-01 ENCOUNTER — Ambulatory Visit (INDEPENDENT_AMBULATORY_CARE_PROVIDER_SITE_OTHER): Payer: 59

## 2014-12-01 DIAGNOSIS — Z23 Encounter for immunization: Secondary | ICD-10-CM

## 2015-01-22 ENCOUNTER — Telehealth: Payer: Self-pay | Admitting: Internal Medicine

## 2015-01-22 DIAGNOSIS — J069 Acute upper respiratory infection, unspecified: Secondary | ICD-10-CM

## 2015-01-22 DIAGNOSIS — B9789 Other viral agents as the cause of diseases classified elsewhere: Principal | ICD-10-CM

## 2015-01-22 MED ORDER — DM-GUAIFENESIN ER 30-600 MG PO TB12: 1.0000 | ORAL_TABLET | Freq: Two times a day (BID) | ORAL | Status: DC | PRN

## 2015-01-22 MED ORDER — PSEUDOEPHEDRINE HCL 30 MG PO TABS: 30.0000 mg | ORAL_TABLET | Freq: Four times a day (QID) | ORAL | Status: DC | PRN

## 2015-01-22 MED ORDER — BENZOCAINE-MENTHOL 6-10 MG MT LOZG: 1.0000 | LOZENGE | OROMUCOSAL | Status: DC | PRN

## 2015-01-22 NOTE — Telephone Encounter (Signed)
  INTERNAL MEDICINE RESIDENCY PROGRAM After-Hours Telephone Call    Reason for call:   I received a call from [name] at 10:08 AM, 01/22/2015 indicating a 3 day history of sore throat, nasal congestion, ear pain, productive cough, and sneezing. She denies fever, chills, nausea, vomiting, diarrhea, shortness of breath. Patient is requesting something for symptom relief and antibiotics. Patient's symptoms are most consistent with a viral URI. I explained that I am happy to send her in medications to help with her congestion, cough, and sore throat, but antibiotics are not indicated at this time. Patient is okay with our plan.    Pertinent Data:   None    Assessment / Plan / Recommendations:   Viral URI  Will prescribe Mucinex DM, pseudoephedrine (no history of HTN), and benzocaine-menthol lozenges.   Follow up in clinic if symptoms worsen or do not improve in the next 1-2 weeks.  As always, pt is advised that if symptoms worsen or new symptoms arise, they should go to an urgent care facility or to to ER for further evaluation.    Osa Craver, DO PGY-2 Internal Medicine Resident Pager # [phone] 01/22/2015 10:08 AM

## 2015-07-27 ENCOUNTER — Encounter: Payer: Self-pay | Admitting: *Deleted

## 2015-08-08 DIAGNOSIS — R42 Dizziness and giddiness: Secondary | ICD-10-CM | POA: Diagnosis not present

## 2015-08-08 DIAGNOSIS — J329 Chronic sinusitis, unspecified: Secondary | ICD-10-CM | POA: Diagnosis not present

## 2015-08-08 DIAGNOSIS — G939 Disorder of brain, unspecified: Secondary | ICD-10-CM | POA: Diagnosis not present

## 2015-08-12 DIAGNOSIS — G939 Disorder of brain, unspecified: Secondary | ICD-10-CM | POA: Diagnosis not present

## 2015-08-12 DIAGNOSIS — R51 Headache: Secondary | ICD-10-CM | POA: Diagnosis not present

## 2015-09-05 DIAGNOSIS — R638 Other symptoms and signs concerning food and fluid intake: Secondary | ICD-10-CM | POA: Diagnosis not present

## 2015-09-05 DIAGNOSIS — H6993 Unspecified Eustachian tube disorder, bilateral: Secondary | ICD-10-CM | POA: Diagnosis not present

## 2015-09-05 DIAGNOSIS — J329 Chronic sinusitis, unspecified: Secondary | ICD-10-CM | POA: Diagnosis not present

## 2015-12-09 ENCOUNTER — Encounter: Payer: Self-pay | Admitting: Internal Medicine

## 2015-12-09 ENCOUNTER — Ambulatory Visit (INDEPENDENT_AMBULATORY_CARE_PROVIDER_SITE_OTHER): Payer: Self-pay | Admitting: Internal Medicine

## 2015-12-09 VITALS — BP 154/77 | HR 70 | Temp 98.0°F | Ht 63.0 in | Wt 211.7 lb

## 2015-12-09 DIAGNOSIS — B9789 Other viral agents as the cause of diseases classified elsewhere: Principal | ICD-10-CM

## 2015-12-09 DIAGNOSIS — R0989 Other specified symptoms and signs involving the circulatory and respiratory systems: Secondary | ICD-10-CM

## 2015-12-09 DIAGNOSIS — J069 Acute upper respiratory infection, unspecified: Secondary | ICD-10-CM

## 2015-12-09 DIAGNOSIS — M791 Myalgia: Secondary | ICD-10-CM

## 2015-12-09 DIAGNOSIS — J029 Acute pharyngitis, unspecified: Secondary | ICD-10-CM

## 2015-12-09 DIAGNOSIS — R05 Cough: Secondary | ICD-10-CM

## 2015-12-09 NOTE — Patient Instructions (Addendum)
It was a pleasure to meet you today Tamara [name],   Stay hydrated and keep resting  Try taking tylenol dont take more than 4000 mg in one day   Please dont hesitate to call us if your symptoms worsen or you develop new symptoms  Please call to schedule a follow up appointment with me in January    Upper Respiratory Infection, Adult Most upper respiratory infections (URIs) are a viral infection of the air passages leading to the lungs. A URI affects the nose, throat, and upper air passages. The most common type of URI is nasopharyngitis and is typically referred to as "the common cold." URIs run their course and usually go away on their own. Most of the time, a URI does not require medical attention, but sometimes a bacterial infection in the upper airways can follow a viral infection. This is called a secondary infection. Sinus and middle ear infections are common types of secondary upper respiratory infections. Bacterial pneumonia can also complicate a URI. A URI can worsen asthma and chronic obstructive pulmonary disease (COPD). Sometimes, these complications can require emergency medical care and may be life threatening.  CAUSES Almost all URIs are caused by viruses. A virus is a type of germ and can spread from one person to another.  RISKS FACTORS You may be at risk for a URI if:   You smoke.   You have chronic heart or lung disease.  You have a weakened defense (immune) system.   You are very young or very old.   You have nasal allergies or asthma.  You work in crowded or poorly ventilated areas.  You work in health care facilities or schools. SIGNS AND SYMPTOMS  Symptoms typically develop 2-3 days after you come in contact with a cold virus. Most viral URIs last 7-10 days. However, viral URIs from the influenza virus (flu virus) can last 14-18 days and are typically more severe. Symptoms may include:   Runny or stuffy (congested) nose.   Sneezing.   Cough.   Sore  throat.   Headache.   Fatigue.   Fever.   Loss of appetite.   Pain in your forehead, behind your eyes, and over your cheekbones (sinus pain).  Muscle aches.  DIAGNOSIS  Your health care provider may diagnose a URI by:  Physical exam.  Tests to check that your symptoms are not due to another condition such as:  Strep throat.  Sinusitis.  Pneumonia.  Asthma. TREATMENT  A URI goes away on its own with time. It cannot be cured with medicines, but medicines may be prescribed or recommended to relieve symptoms. Medicines may help:  Reduce your fever.  Reduce your cough.  Relieve nasal congestion. HOME CARE INSTRUCTIONS   Take medicines only as directed by your health care provider.   Gargle warm saltwater or take cough drops to comfort your throat as directed by your health care provider.  Use a warm mist humidifier or inhale steam from a shower to increase air moisture. This may make it easier to breathe.  Drink enough fluid to keep your urine clear or pale yellow.   Eat soups and other clear broths and maintain good nutrition.   Rest as needed.   Return to work when your temperature has returned to normal or as your health care provider advises. You may need to stay home longer to avoid infecting others. You can also use a face mask and careful hand washing to prevent spread of the virus.  Increase the usage of your inhaler if you have asthma.   Do not use any tobacco products, including cigarettes, chewing tobacco, or electronic cigarettes. If you need help quitting, ask your health care provider. PREVENTION  The best way to protect yourself from getting a cold is to practice good hygiene.   Avoid oral or hand contact with people with cold symptoms.   Wash your hands often if contact occurs.  There is no clear evidence that vitamin C, vitamin E, echinacea, or exercise reduces the chance of developing a cold. However, it is always recommended to  get plenty of rest, exercise, and practice good nutrition.  SEEK MEDICAL CARE IF:   You are getting worse rather than better.   Your symptoms are not controlled by medicine.   You have chills.  You have worsening shortness of breath.  You have brown or red mucus.  You have yellow or brown nasal discharge.  You have pain in your face, especially when you bend forward.  You have a fever.  You have swollen neck glands.  You have pain while swallowing.  You have white areas in the back of your throat. SEEK IMMEDIATE MEDICAL CARE IF:   You have severe or persistent:  Headache.  Ear pain.  Sinus pain.  Chest pain.  You have chronic lung disease and any of the following:  Wheezing.  Prolonged cough.  Coughing up blood.  A change in your usual mucus.  You have a stiff neck.  You have changes in your:  Vision.  Hearing.  Thinking.  Mood. MAKE SURE YOU:   Understand these instructions.  Will watch your condition.  Will get help right away if you are not doing well or get worse.   This information is not intended to replace advice given to you by your health care provider. Make sure you discuss any questions you have with your health care provider.   Document Released: 07/12/2000 Document Revised: 06/02/2014 Document Reviewed: 04/23/2013 [employer] Interactive Patient Education [employer].

## 2015-12-09 NOTE — Progress Notes (Signed)
   CC: productive cough and sore throat   HPI: Tamara [name] is a 55 y.o. with past medical history as outlined below who presents to acute care clinic for cough and soar throat. Five days ago she developed cough with green sputum, sore throat, congestion and body aches. She's had some episodes of chills but they have not lasted long and she did not have a thermometer to take her temperature. She has been feeling tired but her function is not significantly limited and she has still been able to take care of her 84 year old granddaughter. She's tried Chloraseptic spray and TheraFlu they have improved the symptoms but not resolved completely. She has chronic allergies and experiences these cold symptoms about once per year. She has not gotten her flu vaccine yet this year.   Please see problem list for status of the pt's chronic medical problems.  Past Medical History:  Diagnosis Date  . Anemia   . Bowel obstruction   . IBS (irritable bowel syndrome)    at age of 72  . Sciatica 2009    Review of Systems:  Review of Systems  Constitutional: Positive for chills and malaise/fatigue.  HENT: Positive for congestion. Negative for ear pain and sinus pain.   Respiratory: Positive for cough and sputum production. Negative for shortness of breath and wheezing.   Cardiovascular: Negative for chest pain.  Gastrointestinal: Negative for abdominal pain, diarrhea, nausea and vomiting.  Musculoskeletal: Negative for myalgias.    Physical Exam:  Vitals:   12/09/15 0933  BP: (!) 154/77  Pulse: 70  Temp: 98 F (36.7 C)  TempSrc: Oral  SpO2: 100%  Weight: 211 lb 11.2 oz (96 kg)  Height: 5\' 3"  (1.6 m)   Physical Exam  Constitutional: She appears well-developed and well-nourished. No distress.  HENT:  Right Ear: External ear normal. Tympanic membrane is not erythematous and not bulging.  Left Ear: External ear normal. Tympanic membrane is scarred. Tympanic membrane is not erythematous and not  bulging.  Mouth/Throat: No oropharyngeal exudate.  Cardiovascular: Normal rate and regular rhythm.   No murmur heard. Pulmonary/Chest: Effort normal and breath sounds normal. She has no wheezes. She has no rales.  Abdominal: Soft. She exhibits no distension. There is no tenderness.  Lymphadenopathy:    She has no cervical adenopathy.    Assessment & Plan:   See Encounters Tab for problem based charting.   Patient seen with Dr. Evette Doffing

## 2015-12-10 NOTE — Assessment & Plan Note (Addendum)
Five days ago she developed cough with green sputum, sore throat, congestion and body aches. She's had some episodes of chills but they have not lasted long and she did not have a thermometer to take her temperature. She has been feeling tired but her function is not significantly limited and she has still been able to take care of her 55 year old granddaughter. She's tried Chloraseptic spray and TheraFlu they have improved the symptoms but not resolved completely. She has chronic allergies and experiences these cold symptoms about once per year. She has not gotten her flu vaccine yet this year. She is afebrile with normal vitals, she had clear pulmonary breath sounds.  CENTOR criteria for strep throat -1. Given her presentation and physical exam findings, this is most likely related to a viral upper respiratory tract infection   Continue supportive care and told to call us if her symptoms worsen  Influenza vaccine at follow up visit when her symptoms have resolved  Continuity clinic follow up scheduled for 1 month

## 2015-12-10 NOTE — Progress Notes (Signed)
Internal Medicine Clinic Attending  I saw and evaluated the patient.  I personally confirmed the key portions of the history and exam documented by Dr. Blum and I reviewed pertinent patient test results.  The assessment, diagnosis, and plan were formulated together and I agree with the documentation in the resident's note. 

## 2016-01-31 DIAGNOSIS — G35 Multiple sclerosis: Secondary | ICD-10-CM

## 2016-01-31 HISTORY — DX: Multiple sclerosis: G35

## 2016-03-13 ENCOUNTER — Ambulatory Visit (INDEPENDENT_AMBULATORY_CARE_PROVIDER_SITE_OTHER): Payer: BLUE CROSS/BLUE SHIELD | Admitting: Internal Medicine

## 2016-03-13 ENCOUNTER — Encounter: Payer: Self-pay | Admitting: Internal Medicine

## 2016-03-13 ENCOUNTER — Encounter (INDEPENDENT_AMBULATORY_CARE_PROVIDER_SITE_OTHER): Payer: Self-pay

## 2016-03-13 VITALS — BP 153/93 | HR 65 | Temp 98.6°F | Wt 213.7 lb

## 2016-03-13 DIAGNOSIS — J069 Acute upper respiratory infection, unspecified: Secondary | ICD-10-CM | POA: Diagnosis not present

## 2016-03-13 DIAGNOSIS — R030 Elevated blood-pressure reading, without diagnosis of hypertension: Secondary | ICD-10-CM

## 2016-03-13 DIAGNOSIS — B9789 Other viral agents as the cause of diseases classified elsewhere: Principal | ICD-10-CM

## 2016-03-13 MED ORDER — IBUPROFEN 800 MG PO TABS: 800.0000 mg | ORAL_TABLET | Freq: Three times a day (TID) | ORAL | 0 refills | Status: DC | PRN

## 2016-03-13 MED ORDER — GUAIFENESIN-DM 100-10 MG/5ML PO SYRP: 5.0000 mL | ORAL_SOLUTION | ORAL | 0 refills | Status: DC | PRN

## 2016-03-13 MED ORDER — GUAIFENESIN ER 600 MG PO TB12: 600.0000 mg | ORAL_TABLET | Freq: Two times a day (BID) | ORAL | 0 refills | Status: DC

## 2016-03-13 MED ORDER — FLUTICASONE PROPIONATE 50 MCG/ACT NA SUSP: 2.0000 | Freq: Every day | NASAL | 2 refills | Status: DC

## 2016-03-13 NOTE — Patient Instructions (Signed)
You have likely caught a virus, possibly flail, causing your upper respiratory symptoms. We have prescribed to cough medicine, a nasal spray, and Mucinex 1 tablet twice a day take with a full glass of water. Remember to keep yourself hydrated. To prevent others coming back, remember to wash her hand covering her mouth when he cough.  If you feel that your symptoms get better and then get worse, or if you still experiencing symptoms over 2 weeks, please let us now as he may be developing a secondary bacterial infection.  Please follow up in 1 month for a blood pressure recheck.

## 2016-03-13 NOTE — Assessment & Plan Note (Signed)
Blood pressure today is 155/87. She denies any history of hypertension. She checks her blood pressure at home and it is normally in the 120s/80s. However, when patient was seen in our clinic in November 2017 for an infection, her blood pressure was elevated in the 150s/80s. It appears that previous clinic visits she has been normotensive. Her blood pressure is likely acutely elevated today in setting of illness and due to use of pseudoephedrine. I recommended to patient that she continue to monitor her blood pressure at home and to follow up in the clinic for blood pressure recheck when she is healthy.

## 2016-03-13 NOTE — Assessment & Plan Note (Signed)
Patient reports that one week ago she developed nausea, vomiting, and diarrhea which resolved. A day later, and 4 days ago, patient developed nasal congestion, sinus pressure headache, and a productive cough of yellow-green thick mucus. She admits to subjective fevers and chills. She also complains of body aches. She admits to sick contacts in her grand daughter and her daughter. She denies shortness of breath or chest pain. She denies lightheadedness. She has tried TheraFlu, throat lozenges and Tylenol and Advil.  On exam, patient overall appears well. She is afebrile, heart rate 77 satting 100% on room air.  Assessment: Acute viral upper respiratory tract infection  Plan: -Recommend conservative management at this time. It could have possibly been the flu, but patient is outside of the window for treatment. -Flonase daily -Mucinex twice a day -Robitussin DM every 4 hours as needed

## 2016-03-13 NOTE — Progress Notes (Signed)
    CC: Nasal congestion and cough  HPI: Ms.Mekaela Hefley is a 56 y.o. female with PMHx of GERD who presents to the clinic for nasal congestion and cough.   Patient reports that one week ago she developed nausea, vomiting, and diarrhea which resolved. A day later, and 4 days ago, patient developed nasal congestion, sinus pressure headache, and a productive cough of yellow-green thick mucus. She admits to subjective fevers and chills. She also complains of body aches. She admits to sick contacts in her grand daughter and her daughter. She denies shortness of breath or chest pain. She denies lightheadedness. She has tried TheraFlu, throat lozenges and Tylenol and Advil.  Past Medical History:  Diagnosis Date  . Anemia   . Bowel obstruction   . IBS (irritable bowel syndrome)    at age of 61  . Sciatica 2009    Review of Systems: Please see pertinent ROS reviewed in HPI and problem based charting.   Physical Exam: Vitals:   03/13/16 0959  BP: (!) 155/87  Pulse: 77  Temp: 98.6 F (37 C)  TempSrc: Oral  SpO2: 100%  Weight: 213 lb 11.2 oz (96.9 kg)   General: Vital signs reviewed.  Patient is well-developed and well-nourished, in no acute distress and cooperative with exam.  Head: Normocephalic and atraumatic. Eyes: PERRLA, conjunctivae normal, no scleral icterus.  Nose: Erythematous, edematous nasal turbinates Mouth: Normal posterior oropharynx. Ears: Normal tympanic membranes bilaterally.  Neck: Supple, trachea midline, no carotid bruit present.  Cardiovascular: RRR, S1 normal, S2 normal, no murmurs, gallops, or rubs. Pulmonary/Chest: Clear to auscultation bilaterally, no wheezes, rales, or rhonchi. Abdominal: Soft, non-tender, non-distended, BS +  Extremities: No lower extremity edema bilaterally Skin: Warm, dry and intact. No rashes or erythema. Psychiatric: Normal mood and affect. speech and behavior is normal. Cognition and memory are normal.   Assessment & Plan:  See  encounters tab for problem based medical decision making. Patient discussed with Dr. Angelia Mould

## 2016-03-14 ENCOUNTER — Telehealth: Payer: Self-pay

## 2016-03-14 NOTE — Progress Notes (Signed)
Internal Medicine Clinic Attending  Case discussed with Dr. Burns at the time of the visit.  We reviewed the resident's history and exam and pertinent patient test results.  I agree with the assessment, diagnosis, and plan of care documented in the resident's note.  

## 2016-03-14 NOTE — Telephone Encounter (Signed)
Patient was just seen yesterday & now calling saying she's feeling worse. Having chest pain & palpitations. Still coughing thick yellow greenish sputum, her L ear is stuffed up. Informed her about s/s of heart attack & she said she is aware & her chest pain is from coughing so much. It is @ center of her chest made worse when coughing. She is aware to call paramedics if this gets worse. She is taking all prescribed meds from yesterday's visit, drinking plenty of fluids. She is wondering if she needs antibiotics. Pls advise.

## 2016-03-14 NOTE — Telephone Encounter (Signed)
Needs to speak with a nurse regarding meds and not feeling well. Please call back.

## 2016-03-14 NOTE — Telephone Encounter (Signed)
Thank you Tamara [name]. [name] has a viral respiratory infection, so antibiotics are not indicated at this time. Certainly if she feels she needs to be seen again, she can return to clinic.

## 2016-03-15 ENCOUNTER — Encounter: Payer: Self-pay | Admitting: Internal Medicine

## 2016-03-15 ENCOUNTER — Ambulatory Visit: Payer: BLUE CROSS/BLUE SHIELD

## 2016-03-15 NOTE — Telephone Encounter (Signed)
Pt is calling back requesting to speak with at nurse.

## 2016-03-15 NOTE — Telephone Encounter (Signed)
Spoke w/ pt she states she is getting worse, spoke w/ sr mullen, pt will come to [facility] at 1545 today

## 2016-03-17 ENCOUNTER — Ambulatory Visit (INDEPENDENT_AMBULATORY_CARE_PROVIDER_SITE_OTHER): Payer: BLUE CROSS/BLUE SHIELD | Admitting: Internal Medicine

## 2016-03-17 ENCOUNTER — Encounter: Payer: Self-pay | Admitting: Internal Medicine

## 2016-03-17 ENCOUNTER — Ambulatory Visit (HOSPITAL_COMMUNITY)
Admission: RE | Admit: 2016-03-17 | Discharge: 2016-03-17 | Disposition: A | Discharge status: Home / Self Care | Payer: BLUE CROSS/BLUE SHIELD | Source: Ambulatory Visit | Attending: Internal Medicine | Admitting: Internal Medicine

## 2016-03-17 VITALS — BP 139/72 | HR 72 | Temp 98.3°F | Ht 63.0 in | Wt 211.9 lb

## 2016-03-17 DIAGNOSIS — R0989 Other specified symptoms and signs involving the circulatory and respiratory systems: Secondary | ICD-10-CM

## 2016-03-17 DIAGNOSIS — B9789 Other viral agents as the cause of diseases classified elsewhere: Principal | ICD-10-CM

## 2016-03-17 DIAGNOSIS — J029 Acute pharyngitis, unspecified: Secondary | ICD-10-CM | POA: Diagnosis not present

## 2016-03-17 DIAGNOSIS — R002 Palpitations: Secondary | ICD-10-CM

## 2016-03-17 DIAGNOSIS — G4440 Drug-induced headache, not elsewhere classified, not intractable: Secondary | ICD-10-CM

## 2016-03-17 DIAGNOSIS — R0789 Other chest pain: Secondary | ICD-10-CM

## 2016-03-17 DIAGNOSIS — J3489 Other specified disorders of nose and nasal sinuses: Secondary | ICD-10-CM | POA: Diagnosis not present

## 2016-03-17 DIAGNOSIS — R42 Dizziness and giddiness: Secondary | ICD-10-CM | POA: Diagnosis not present

## 2016-03-17 DIAGNOSIS — R0609 Other forms of dyspnea: Secondary | ICD-10-CM

## 2016-03-17 DIAGNOSIS — R05 Cough: Secondary | ICD-10-CM | POA: Diagnosis not present

## 2016-03-17 DIAGNOSIS — T391X5A Adverse effect of 4-Aminophenol derivatives, initial encounter: Secondary | ICD-10-CM

## 2016-03-17 DIAGNOSIS — J069 Acute upper respiratory infection, unspecified: Secondary | ICD-10-CM

## 2016-03-17 DIAGNOSIS — R0600 Dyspnea, unspecified: Secondary | ICD-10-CM | POA: Diagnosis not present

## 2016-03-17 DIAGNOSIS — R590 Localized enlarged lymph nodes: Secondary | ICD-10-CM

## 2016-03-17 DIAGNOSIS — J209 Acute bronchitis, unspecified: Secondary | ICD-10-CM

## 2016-03-17 DIAGNOSIS — T39315A Adverse effect of propionic acid derivatives, initial encounter: Secondary | ICD-10-CM

## 2016-03-17 DIAGNOSIS — R51 Headache: Secondary | ICD-10-CM

## 2016-03-17 DIAGNOSIS — R058 Other specified cough: Secondary | ICD-10-CM

## 2016-03-17 LAB — CBC WITH DIFFERENTIAL/PLATELET
BASOS ABS: 0 10*3/uL (ref 0.0–0.1)
Basophils Relative: 0 %
Eosinophils Absolute: 0.1 10*3/uL (ref 0.0–0.7)
Eosinophils Relative: 1 %
HCT: 37.9 % (ref 36.0–46.0)
HEMOGLOBIN: 12.1 g/dL (ref 12.0–15.0)
LYMPHS PCT: 41 %
Lymphs Abs: 2.2 10*3/uL (ref 0.7–4.0)
MCH: 28 pg (ref 26.0–34.0)
MCHC: 31.9 g/dL (ref 30.0–36.0)
MCV: 87.7 fL (ref 78.0–100.0)
Monocytes Absolute: 0.4 10*3/uL (ref 0.1–1.0)
Monocytes Relative: 7 %
NEUTROS PCT: 51 %
Neutro Abs: 2.7 10*3/uL (ref 1.7–7.7)
Platelets: 180 10*3/uL (ref 150–400)
RBC: 4.32 MIL/uL (ref 3.87–5.11)
RDW: 14 % (ref 11.5–15.5)
WBC: 5.3 10*3/uL (ref 4.0–10.5)

## 2016-03-17 LAB — BASIC METABOLIC PANEL
ANION GAP: 7 (ref 5–15)
BUN: 10 mg/dL (ref 6–20)
CALCIUM: 9.3 mg/dL (ref 8.9–10.3)
CO2: 27 mmol/L (ref 22–32)
Chloride: 108 mmol/L (ref 101–111)
Creatinine, Ser: 1.05 mg/dL — ABNORMAL HIGH (ref 0.44–1.00)
GFR calc non Af Amer: 59 mL/min — ABNORMAL LOW (ref >60)
GLUCOSE: 85 mg/dL (ref 65–99)
POTASSIUM: 4.4 mmol/L (ref 3.5–5.1)
Sodium: 142 mmol/L (ref 135–145)

## 2016-03-17 MED ORDER — GUAIFENESIN-CODEINE 100-10 MG/5ML PO SOLN: 5.0000 mL | ORAL | 0 refills | Status: DC | PRN

## 2016-03-17 NOTE — Assessment & Plan Note (Signed)
Persistent generalized throbbing daily headache, posterior and sometimes frontal from sinus pressure. No N/V, photo/phonophobia, no neck stiffness, no documented fever (experiencing subjective "sweats"). Reports taking Ibuprofen 800 mg up to twice daily and Tylenol 500 mg up to six times daily, alternating, for chest discomfort from persistent cough. This headache is new and feels different than her previous migraines.   Assessment: Medication overuse headache  Plan: - Encourage cessation of Ibuprofen and Tylenol, can continue intermittently as needed if chest discomfort is severe

## 2016-03-17 NOTE — Patient Instructions (Addendum)
We checked some lab tests and a chest x-ray today and found no evidence of severe infection or pneumonia. You are likely continuing to experience a viral upper respiratory infection  Please continue to eat and stay well-hydrated to avoid becoming dehydrated.   It is also important for you to cut down and stop the Ibuprofen and Tylenol you are taking every few hours. As this is likely giving you a medication-overuse headache.  We have provided a prescription for guaifenesin-codeine cough syrup that you can take every 4 hours as needed. Try this instead of the Robitussin-DM cough syrup. If you notice confusion or feeling strange with this new medication stop taking it or use it less often.  We can see you in 1-2 weeks as needed if your symptoms do not improve. Otherwise you have an appointment with your PCP in March.  Take care, we hope you feel better soon!

## 2016-03-17 NOTE — Assessment & Plan Note (Addendum)
Reports persistent symptoms despite conservative management this week. Now has had about 8 days of symptoms. Reports new daily headaches and sinus pressure, cough productive of scant thick green sputum, chest discomfort and tightness with cough and on exertion, dyspnea on exertion, orthopnea requiring sleep in recliner, and 1-2 days of poor appetite and decreased po intake. She reports some dizziness, dyspnea, and palpitations with exertion. Palpitations resolve with rest. She has been taking Ibuprofen 800 mg BID PRN and Tylenol 500 mg six times daily. She states that her appetite has left her today and she has no interest in eating and drinking, states that he throat. Reports ears feeling "plugged up" as well, non painful, no tinnitus. On exam lungs sound clear, oropharynx nonerythematous, tympanic membranes with benign serous effusions bilaterally, some cervical lymphadenopathy, no increased WOB. Vitals stable.  Assessment: continued viral URI vs CAP  Plan: - Checking orthostatic vitals - lying 146/80, hr 64, sitting 133/75, 63, standing 145/84, 65 - stable - Checking stat CBC, BMP - no leukocytosis or electrolyte abnormalities - Checking stat CXR - no active disease - Recommend increased hydration and po intake - Continue symptomatic management with Mucinex - Provided Rx for guaifenesin-codeine cough syrup instead of Robitussin-DM. Patient has allergy history of "delusions" with codeine that she states happened years ago at age 56, willing to try this codeine-containing cough syrup under supervision, advised to return to regular Robitussin-DM if any concern for developing adverse reaction.

## 2016-03-17 NOTE — Progress Notes (Addendum)
   CC: Productive cough  HPI:  Tamara [name] is a 56 y.o. female with PMHx detailed below presenting with complaints of persistent congestion and cough productive of green sputum this past week despite symptomatic treatment. She also reports appetite loss, dizziness, persistent headache, dyspnea and palpitations on exertion, and rib and chest discomfort with coughing.   See problem based assessment and plan below for additional details.  Past Medical History:  Diagnosis Date  . Anemia   . Bowel obstruction   . GERD (gastroesophageal reflux disease)   . IBS (irritable bowel syndrome)    at age of 68  . Sciatica 2009    Review of Systems: Review of Systems  Constitutional: Positive for chills, fever and malaise/fatigue. Negative for diaphoresis and weight loss.  HENT: Positive for congestion, hearing loss, sinus pain and sore throat. Negative for ear discharge, ear pain and tinnitus.   Respiratory: Positive for cough, sputum production and shortness of breath. Negative for hemoptysis and wheezing.   Cardiovascular: Positive for chest pain, palpitations and orthopnea. Negative for leg swelling.  Gastrointestinal: Negative for abdominal pain, constipation, diarrhea, nausea and vomiting.  Genitourinary: Negative for dysuria, frequency and urgency.  Musculoskeletal: Negative for back pain, falls, joint pain and neck pain.  Skin: Negative for rash.  Neurological: Positive for dizziness and headaches.  All other systems reviewed and are negative.   Physical Exam: Vitals:   03/17/16 1423  BP: 139/72  Pulse: 72  Temp: 98.3 F (36.8 C)  TempSrc: Oral  SpO2: 100%  Weight: 211 lb 14.4 oz (96.1 kg)  Height: 5\' 3"  (1.6 m)   Body mass index is 37.54 kg/m. GENERAL- Obese tired-appearing woman sitting comfortably in exam room chair, alert, in no distress, conversational, wearing mask, intermittent cough HEENT- Atraumatic, PERRL, moist mucous membranes, oropharynx clear, painful/palpable  cervical lymphadenopathy, sinuses non-tender to palpation, tympanic membranes non-erythematous with clear/ serous effusion CARDIAC- Regular rate and rhythm, no murmurs, rubs or gallops. RESP/CHEST- Clear to ascultation bilaterally, no wheezing or crackles, normal work of breathing, sternum and bilateral ribs tender to palpation ABDOMEN- Normoactive bowel sounds, soft, nontender, nondistended BACK- Normal curvature, no paraspinal tenderness, no spinal tenderness. EXTREMITIES- Normal bulk and range of motion, no edema, 2+ peripheral pulses SKIN- Warm, dry, intact, without visible rash PSYCH- Appropriate affect, clear speech, thoughts linear and goal-directed  Assessment & Plan:   See encounters tab for problem based medical decision making.  Patient discussed with Dr. Evette Doffing

## 2016-03-21 NOTE — Progress Notes (Signed)
Internal Medicine Clinic Attending  Case discussed with Dr. Johnson at the time of the visit.  We reviewed the resident's history and exam and pertinent patient test results.  I agree with the assessment, diagnosis, and plan of care documented in the resident's note.  

## 2016-03-23 ENCOUNTER — Telehealth: Payer: Self-pay | Admitting: Internal Medicine

## 2016-03-23 NOTE — Telephone Encounter (Signed)
Pt calls and states her ears are not better, she states she was told by the doctor that an abx would be called in for her if her ears did not improve, could this be done, she states she was told she would not need to come back to clinic

## 2016-03-23 NOTE — Telephone Encounter (Signed)
See other encounter from today.  

## 2016-03-23 NOTE — Telephone Encounter (Signed)
Pt would like to speak with nurse.

## 2016-03-24 ENCOUNTER — Telehealth: Payer: Self-pay | Admitting: Internal Medicine

## 2016-03-24 NOTE — Telephone Encounter (Signed)
Asking to speak with Tamara [name]. Please call back.  

## 2016-03-24 NOTE — Telephone Encounter (Signed)
   Reason for call:   I received a call from [name] at 4:44 PM indicating that she is concerned she has fluid behind her ears.   Pertinent Data:   Patient was seen in [facility] on 03/17/16 for viral URI and treated with Robitussin DM, Mucinex, and advise for increased hydration and po intake.  Patient states most URI symptoms are resolved, but now has feeling of her ears "popping" with internal pruritus.  She denies any fevers, chills, post-nasal drip, or ear drainage.  She is concerned about losing her hearing in the future.   Assessment / Plan / Recommendations:   Patient's symptoms likely from residual effect from her recent viral URI with possible eustachian tube irritation.  I advised the patient to try antihistamines, frequent nasal irrigation, and continued hydration.  Patient prefers to try antibiotics instead and will consider either returning to clinic during normal operating hours or going to an urgent care center for further evaluation.  As always, pt is advised that if symptoms worsen or new symptoms arise, they should go to an urgent care facility or to to ER for further evaluation.   Tamara Finders, MD   03/24/2016, 5:55 PM

## 2016-03-24 NOTE — Telephone Encounter (Signed)
Has spoken to dr patel, he will speak to dr Wynetta Emery

## 2016-03-27 ENCOUNTER — Ambulatory Visit (INDEPENDENT_AMBULATORY_CARE_PROVIDER_SITE_OTHER): Payer: BLUE CROSS/BLUE SHIELD | Admitting: Pulmonary Disease

## 2016-03-27 ENCOUNTER — Encounter: Payer: Self-pay | Admitting: Pulmonary Disease

## 2016-03-27 DIAGNOSIS — H6693 Otitis media, unspecified, bilateral: Secondary | ICD-10-CM | POA: Diagnosis not present

## 2016-03-27 DIAGNOSIS — H6690 Otitis media, unspecified, unspecified ear: Secondary | ICD-10-CM

## 2016-03-27 MED ORDER — CEFDINIR 300 MG PO CAPS: 600.0000 mg | ORAL_CAPSULE | Freq: Every day | ORAL | 0 refills | Status: AC

## 2016-03-27 NOTE — Patient Instructions (Addendum)
Take your antibiotic, cefdinir, two tablets daily for 5 days. Let us know if your symptoms worsen or you develop new symptoms.   Otitis Media, Adult Otitis media is redness, soreness, and puffiness (swelling) in the space just behind your eardrum (middle ear). It may be caused by allergies or infection. It often happens along with a cold. Follow these instructions at home:  Take your medicine as told. Finish it even if you start to feel better.  Only take over-the-counter or prescription medicines for pain, discomfort, or fever as told by your doctor.  Follow up with your doctor as told. Contact a doctor if:  You have otitis media only in one ear, or bleeding from your nose, or both.  You notice a lump on your neck.  You are not getting better in 3-5 days.  You feel worse instead of better. Get help right away if:  You have pain that is not helped with medicine.  You have puffiness, redness, or pain around your ear.  You get a stiff neck.  You cannot move part of your face (paralysis).  You notice that the bone behind your ear hurts when you touch it. This information is not intended to replace advice given to you by your health care provider. Make sure you discuss any questions you have with your health care provider. Document Released: 07/05/2007 Document Revised: 06/24/2015 Document Reviewed: 08/13/2012 Elsevier Interactive Patient Education  2017 [employer].

## 2016-03-27 NOTE — Progress Notes (Signed)
   CC: ear pain  HPI:  [name] is a 56 y.o. woman with history as noted below here with ear pain.  She was seen in clinic 2/16 for viral URI. She has popping sensation in her ears and itching. She has ear pain on both sides. No drainage from ear. Has had these symptoms for 2.5 weeks. She has seen an ENT physician a while back. Hx of tympanostomy tubes when she was 5. Had a lot of ear infections growing up. She has about 1 ear infection a year. Upper respiratory symptoms improving. She had skin rash but denise urticaria or anaphylaxis with Augmentin.   Past Medical History:  Diagnosis Date  . Anemia   . Bowel obstruction   . GERD (gastroesophageal reflux disease)   . IBS (irritable bowel syndrome)    at age of 52  . Sciatica 2009    Review of Systems:   No fevers or chills No chest pain   Physical Exam:  Vitals:   03/27/16 1335  BP: 128/63  Pulse: 72  Temp: 98 F (36.7 C)  TempSrc: Oral  SpO2: 100%  Weight: 214 lb 11.2 oz (97.4 kg)  Height: 5\' 3"  (1.6 m)   General Apperance: NAD HEENT: Normocephalic, atraumatic, anicteric sclera, bilateral TM with fluid Neck: Supple, trachea midline Lungs: Clear to auscultation bilaterally. No wheezes, rhonchi or rales. Breathing comfortably Heart: Regular rate and rhythm, no murmur/rub/gallop Abdomen: Soft, nontender, nondistended, no rebound/guarding Extremities: Warm and well perfused, no edema Skin: No rashes or lesions Neurologic: Alert and interactive. No gross deficits.  Assessment & Plan:   See Encounters Tab for problem based charting.  Patient discussed with Dr. Angelia Mould

## 2016-03-27 NOTE — Assessment & Plan Note (Signed)
Assessment: Fluid behind tympanic membrane bilaterally.  Plan:  Cefdinir 600 mg once daily for 5 days

## 2016-03-28 NOTE — Progress Notes (Signed)
Internal Medicine Clinic Attending  Case discussed with Dr. Krall at the time of the visit.  We reviewed the resident's history and exam and pertinent patient test results.  I agree with the assessment, diagnosis, and plan of care documented in the resident's note.  

## 2016-04-12 ENCOUNTER — Ambulatory Visit (INDEPENDENT_AMBULATORY_CARE_PROVIDER_SITE_OTHER): Payer: BLUE CROSS/BLUE SHIELD | Admitting: Internal Medicine

## 2016-04-12 DIAGNOSIS — H9203 Otalgia, bilateral: Secondary | ICD-10-CM

## 2016-04-12 DIAGNOSIS — J309 Allergic rhinitis, unspecified: Secondary | ICD-10-CM

## 2016-04-12 DIAGNOSIS — J3489 Other specified disorders of nose and nasal sinuses: Secondary | ICD-10-CM | POA: Diagnosis not present

## 2016-04-12 DIAGNOSIS — R05 Cough: Secondary | ICD-10-CM

## 2016-04-12 DIAGNOSIS — R0982 Postnasal drip: Secondary | ICD-10-CM

## 2016-04-12 DIAGNOSIS — R6884 Jaw pain: Secondary | ICD-10-CM

## 2016-04-12 DIAGNOSIS — J029 Acute pharyngitis, unspecified: Secondary | ICD-10-CM | POA: Diagnosis not present

## 2016-04-12 MED ORDER — OXYMETAZOLINE HCL 0.05 % NA SOLN: 1.0000 | Freq: Two times a day (BID) | NASAL | 0 refills | Status: DC

## 2016-04-12 MED ORDER — CETIRIZINE HCL 10 MG PO TABS: 10.0000 mg | ORAL_TABLET | Freq: Every day | ORAL | 2 refills | Status: DC

## 2016-04-12 NOTE — Progress Notes (Signed)
   CC: ear pain  HPI:  [name] is a 57 y.o. with pmh as listed below is here for ear pain b/l for 1 week.   Was seen on 2/16 for viral URI, prescribed guaifenesin-codeine cough syrup instead of Robitussin-DM.   Seen again on 03/27/16 for b/l ear pain. Exam that day showed b/l fluid behind TM, prescribed cefdinir 600mg  daily for 5 days.   She continues to have b/l ear fullness and aching pain. Has runny nose and congestion, post nasal drip, and throat irritation. Also having some productive cough, especially since yesterday. No fevers, no n/v/diarrhea, myalgia. Finished cefdinir course. She feels the pollens are bothering her and causing the nasal drainage. Not using flonase or any thing else for allergies.   Past Medical History:  Diagnosis Date  . Anemia   . Bowel obstruction   . GERD (gastroesophageal reflux disease)   . IBS (irritable bowel syndrome)    at age of 73  . Sciatica 2009    Review of Systems:   Review of Systems  HENT: Positive for congestion, ear pain, sinus pain and sore throat. Negative for ear discharge.   Eyes: Negative for blurred vision.  Respiratory: Positive for cough and sputum production. Negative for shortness of breath and wheezing.   Cardiovascular: Negative for chest pain.  Gastrointestinal: Negative for heartburn, nausea and vomiting.  Genitourinary: Negative for dysuria.  Musculoskeletal: Negative for myalgias.  Neurological: Negative for dizziness and headaches.  Psychiatric/Behavioral: Negative for depression.     Physical Exam:  Vitals:   04/12/16 1052  BP: 139/70  Pulse: 65  Temp: 98.4 F (36.9 C)  TempSrc: Oral  SpO2: 100%  Weight: 219 lb (99.3 kg)  Height: 5\' 3"  (1.6 m)   Physical Exam  Constitutional: She is oriented to person, place, and time. She appears well-developed and well-nourished. No distress.  HENT:  Head: Normocephalic and atraumatic.  Mouth/Throat: No oropharyngeal exudate.  Has tenderness over left  TMJ.  B/l TM are clear, no fluids, retraction or bulging.   Has tenderness over the maxillary sinuses.   Eyes: Conjunctivae are normal. Right eye exhibits no discharge. Left eye exhibits no discharge.  Musculoskeletal: Normal range of motion. She exhibits no edema.  Neurological: She is alert and oriented to person, place, and time. No cranial nerve deficit.  Skin: She is not diaphoretic.    Assessment & Plan:   See Encounters Tab for problem based charting.  Patient discussed with Dr. Eppie Gibson

## 2016-04-12 NOTE — Patient Instructions (Signed)
Please use flonase twice daily every day.  Use zyrtec allergy medication every day.  Use Afrin nasal decongestant spray for 3 days to help with congestion.  Use Neti Pot (over the counter) to help clean your sinuses.  Take tylenol for pain.  Follow up as needed if you are not feeling better.

## 2016-04-12 NOTE — Assessment & Plan Note (Addendum)
Patient's ear fullness and pain is likely 2/2 to sinus congestion leading to transmitted pressure to the ear. She may also have TMJ causing a component of the discomfort or referred pain from her maxillary sinuses to the ear. No signs of active infection. Likely has allergic rhinitis causing post nasal drip, sinus pressure, sore throat, and cough.   - will treat aggressively with flonase bid, zyrtec daily, Afrin decongestant for 3 days, Neti pot and humidifier. - asked to see her dentist for TMJ for possible night guard.  - tylenol prn for pain F/up prn

## 2016-04-12 NOTE — Progress Notes (Signed)
Case discussed with Dr. Ahmed at the time of the visit.  We reviewed the resident's history and exam and pertinent patient test results.  I agree with the assessment, diagnosis and plan of care documented in the resident's note. 

## 2016-04-17 ENCOUNTER — Telehealth: Payer: Self-pay | Admitting: Internal Medicine

## 2016-04-17 NOTE — Telephone Encounter (Signed)
APT. REMINDER CALL, NO ANSWER, MAILBOX IS FULL

## 2016-04-18 ENCOUNTER — Encounter: Payer: Self-pay | Admitting: Internal Medicine

## 2016-06-09 DIAGNOSIS — R413 Other amnesia: Secondary | ICD-10-CM | POA: Diagnosis not present

## 2016-06-09 DIAGNOSIS — M792 Neuralgia and neuritis, unspecified: Secondary | ICD-10-CM | POA: Diagnosis not present

## 2016-06-09 DIAGNOSIS — M545 Low back pain: Secondary | ICD-10-CM | POA: Diagnosis not present

## 2016-06-10 ENCOUNTER — Emergency Department (HOSPITAL_COMMUNITY): Payer: BLUE CROSS/BLUE SHIELD

## 2016-06-10 ENCOUNTER — Emergency Department (HOSPITAL_COMMUNITY)
Admission: EM | Admit: 2016-06-10 | Discharge: 2016-06-10 | Disposition: A | Discharge status: Home / Self Care | Payer: BLUE CROSS/BLUE SHIELD | Attending: Emergency Medicine | Admitting: Emergency Medicine

## 2016-06-10 ENCOUNTER — Encounter (HOSPITAL_COMMUNITY): Payer: Self-pay | Admitting: Emergency Medicine

## 2016-06-10 DIAGNOSIS — R202 Paresthesia of skin: Secondary | ICD-10-CM

## 2016-06-10 DIAGNOSIS — J069 Acute upper respiratory infection, unspecified: Secondary | ICD-10-CM

## 2016-06-10 DIAGNOSIS — Z79899 Other long term (current) drug therapy: Secondary | ICD-10-CM | POA: Insufficient documentation

## 2016-06-10 DIAGNOSIS — R200 Anesthesia of skin: Secondary | ICD-10-CM | POA: Diagnosis not present

## 2016-06-10 DIAGNOSIS — M5417 Radiculopathy, lumbosacral region: Secondary | ICD-10-CM | POA: Diagnosis not present

## 2016-06-10 DIAGNOSIS — R079 Chest pain, unspecified: Secondary | ICD-10-CM | POA: Diagnosis not present

## 2016-06-10 DIAGNOSIS — M792 Neuralgia and neuritis, unspecified: Secondary | ICD-10-CM | POA: Diagnosis not present

## 2016-06-10 DIAGNOSIS — R51 Headache: Secondary | ICD-10-CM | POA: Diagnosis not present

## 2016-06-10 DIAGNOSIS — M545 Low back pain: Secondary | ICD-10-CM | POA: Diagnosis not present

## 2016-06-10 DIAGNOSIS — B9789 Other viral agents as the cause of diseases classified elsewhere: Secondary | ICD-10-CM

## 2016-06-10 LAB — CBC
HEMATOCRIT: 36.6 % (ref 36.0–46.0)
Hemoglobin: 11.6 g/dL — ABNORMAL LOW (ref 12.0–15.0)
MCH: 27.2 pg (ref 26.0–34.0)
MCHC: 31.7 g/dL (ref 30.0–36.0)
MCV: 85.9 fL (ref 78.0–100.0)
Platelets: 196 10*3/uL (ref 150–400)
RBC: 4.26 MIL/uL (ref 3.87–5.11)
RDW: 13.8 % (ref 11.5–15.5)
WBC: 6.4 10*3/uL (ref 4.0–10.5)

## 2016-06-10 LAB — BASIC METABOLIC PANEL
Anion gap: 9 (ref 5–15)
BUN: 11 mg/dL (ref 6–20)
CHLORIDE: 106 mmol/L (ref 101–111)
CO2: 23 mmol/L (ref 22–32)
Calcium: 9.4 mg/dL (ref 8.9–10.3)
Creatinine, Ser: 0.85 mg/dL (ref 0.44–1.00)
Glucose, Bld: 87 mg/dL (ref 65–99)
POTASSIUM: 3.7 mmol/L (ref 3.5–5.1)
SODIUM: 138 mmol/L (ref 135–145)

## 2016-06-10 LAB — I-STAT TROPONIN, ED: TROPONIN I, POC: 0 ng/mL (ref 0.00–0.08)

## 2016-06-10 MED ORDER — PREDNISONE 20 MG PO TABS: 40.0000 mg | ORAL_TABLET | Freq: Every day | ORAL | 0 refills | Status: DC

## 2016-06-10 MED ORDER — IBUPROFEN 800 MG PO TABS: 800.0000 mg | ORAL_TABLET | Freq: Three times a day (TID) | ORAL | 0 refills | Status: DC | PRN

## 2016-06-10 MED ORDER — ACETAMINOPHEN 500 MG PO TABS
1000.0000 mg | ORAL_TABLET | Freq: Once | ORAL | Status: AC
  Administered 2016-06-10: 1000 mg via ORAL
  Filled 2016-06-10: qty 2

## 2016-06-10 MED ORDER — PREDNISONE 20 MG PO TABS
60.0000 mg | ORAL_TABLET | ORAL | Status: AC
  Administered 2016-06-10: 60 mg via ORAL
  Filled 2016-06-10: qty 3

## 2016-06-10 NOTE — ED Notes (Signed)
Pt requesting prescription for Ibu 800 mg as the prescription for Tramadol she has at home makes her "sick".  Dr. Vanita Panda wrote prescription, this nurse gave to patient.

## 2016-06-10 NOTE — ED Notes (Addendum)
Patient continues to express concerns about the delays in her triage and her care.  Patient refused to speak to this RN.  States this RN is too loud.  Asked this RN to provide her with her last name, this RN refused.  Asked for the last names of multiple staff members.  Patient putting information and updated in her phone about her delays, care, and interactions.  This Rn apologized for delays and spoke to Agricultural consultant.  Charge RN with trauma and so [facility] contacted, who stated there would be a delay in addressing patient.  Patient updated, asked for last name again, this RN apologized for not being able to provide and made patient aware that they were working on cleaning her room and should be out to get her after they are able to stabilize the trauma.  Patient continued to type on phone and ignore RN who returned to nurse first.

## 2016-06-10 NOTE — ED Provider Notes (Signed)
[place] DEPT Provider Note   CSN: [id number] Arrival date & time: 06/10/16  1651     History   Chief Complaint Chief Complaint  Patient presents with  . Chest Pain  . Back Pain    HPI [name] is a 55 y.o. female.  HPI  Patient with history of radiculopathy following prior surgical procedures now presents with multiple concerns. She notes that she baseline has bilateral lower extremity numbness and tingling. It seems as though over the past few days the patient has also developed triggering in her hands, as well as worsening of her low back pain. Patient relates onset of symptoms to prior hysterectomy, with complications. However, her baseline condition is generally notable only for ongoing low back pain, as well as lower extremity paresthesia.  Patient is here with family members to assist the history of present illness.   Past Medical History:  Diagnosis Date  . Anemia   . Bowel obstruction ([place])   . GERD (gastroesophageal reflux disease)   . IBS (irritable bowel syndrome)    at age of 9  . Sciatica 2009    Patient Active Problem List   Diagnosis Date Noted  . Allergic rhinitis 04/12/2016  . Medication overuse headache 03/17/2016  . Elevated blood pressure reading in office without diagnosis of hypertension 03/13/2016  . Otitis media 02/23/2014  . Sciatica associated with disorder of lumbosacral spine 01/15/2014  . Edema 01/15/2014  . Headache(784.0) 07/21/2013  . GERD (gastroesophageal reflux disease) 07/21/2013  . RBBB on EKG 08/01/2012  . IBS (irritable bowel syndrome) 06/19/2012  . Healthcare maintenance 06/19/2012    Past Surgical History:  Procedure Laterality Date  . ABDOMINAL HYSTERECTOMY  2008   cervix and right ovary still intact  . KNEE ARTHROSCOPY  2010 and 2011   Left knee, x2    OB History    Gravida Para Term Preterm AB Living   '2 2 2 ' 0 0 2   SAB TAB Ectopic Multiple Live Births   0 0 0 0         Home Medications     Prior to Admission medications   Medication Sig Start Date End Date Taking? Authorizing Provider  ACAI BERRY PO Take 1 capsule by mouth daily as needed (for vitamin).    [provider]  b complex vitamins tablet Take 1 tablet by mouth daily. 06/19/12   Neta Ehlers, MD  benzocaine-menthol (CHLORAEPTIC) 6-10 MG lozenge Take 1 lozenge by mouth as needed for sore throat. 01/22/15   Burns, Arloa Koh, MD  Camphor-Eucalyptus-Menthol (VICKS VAPORUB) 4.73-1.2-2.6 % OINT Apply 1 application topically daily as needed (for feet).    [provider]  cetirizine (ZYRTEC ALLERGY) 10 MG tablet Take 1 tablet (10 mg total) by mouth daily. 04/12/16   Dellia Nims, MD  cholecalciferol (VITAMIN D) 400 UNITS TABS tablet Take 400 Units by mouth daily.     [provider]  fluticasone (FLONASE) 50 MCG/ACT nasal spray Place 2 sprays into both nostrils daily. 03/13/16   Burns, Arloa Koh, MD  furosemide (LASIX) 20 MG tablet Take 1 tablet (20 mg total) by mouth daily as needed for fluid or edema. 01/15/14   Burns, Arloa Koh, MD  GARCINIA CAMBOGIA-CHROMIUM PO Take 1 capsule by mouth daily as needed (for vitamin).    [provider]  guaiFENesin (MUCINEX) 600 MG 12 hr tablet Take 1 tablet (600 mg total) by mouth 2 (two) times daily. 03/13/16   Florinda Marker, MD  guaiFENesin-codeine  100-10 MG/5ML syrup Take 5 mLs by mouth every 4 (four) hours as needed for cough. 03/17/16   Asencion Partridge, MD  ibuprofen (ADVIL,MOTRIN) 800 MG tablet Take 1 tablet (800 mg total) by mouth every 8 (eight) hours as needed. 03/13/16   Burns, Arloa Koh, MD  Miconazole Nitrate 2 % AERP Apply to groin twice a day for 7-10 day. 05/22/14   Jessee Avers, MD  MOVIPREP 100 G SOLR Take 1 kit (200 g total) by mouth once. 04/06/14   Inda Castle, MD  Multiple Vitamin (MULTIVITAMIN) tablet Take 1 tablet by mouth daily. 06/19/12   Neta Ehlers, MD  Omega-3 Fatty Acids (FISH OIL) 1000 MG CAPS Take 1,000-2,000 mg by mouth 2 (two)  times daily. Take 2 capsules in the morning and take 1 capsule at bedtime    [provider]  oxymetazoline (AFRIN NASAL SPRAY) 0.05 % nasal spray Place 1 spray into both nostrils 2 (two) times daily. 04/12/16   Ahmed, Chesley Mires, MD  Polyethyl Glycol-Propyl Glycol (SYSTANE) 0.4-0.3 % SOLN Place 1 drop into both eyes daily as needed (for dry eyes).    [provider]  Potassium 75 MG TABS Take 1 tablet by mouth as needed.    [provider]  UNABLE TO FIND Pt takes 1/2 drop,, by mouth, with meals. Pt is taking Cal Slim Vitamin Drops (green tea, rasberry,african mango)    [provider]  vitamin E 100 UNIT capsule Take 100 Units by mouth daily.    [provider]    Family History Family History  Problem Relation Age of Onset  . Cancer Father        Gallbladder  . Gallbladder disease Father   . Hypertension Brother   . Gallbladder disease Sister        x3  . Gallbladder disease Paternal Grandmother   . Colon cancer Neg Hx   . Colon polyps Neg Hx   . Esophageal cancer Neg Hx   . Kidney disease Neg Hx     Social History Social History  Substance Use Topics  . Smoking status: Never Smoker  . Smokeless tobacco: Never Used  . Alcohol use No     Allergies   Codeine; Prednisone; Dilaudid [hydromorphone hcl]; Paxil [paroxetine hcl]; Adhesive [tape]; and Augmentin [amoxicillin-pot clavulanate]   Review of Systems Review of Systems  Constitutional:       Per HPI, otherwise negative  HENT:       Per HPI, otherwise negative  Respiratory:       Per HPI, otherwise negative  Cardiovascular:       Per HPI, otherwise negative  Gastrointestinal: Negative for vomiting.  Endocrine:       Negative aside from HPI  Genitourinary:       Neg aside from HPI   Musculoskeletal:       Per HPI, otherwise negative  Skin: Negative.   Neurological: Positive for numbness and headaches. Negative for syncope.     Physical Exam Updated Vital Signs BP  (!) 166/90   Pulse 60   Temp 99 F (37.2 C) (Oral)   Resp 19   SpO2 100%   Physical Exam  Constitutional: She is oriented to person, place, and time. She appears well-developed and well-nourished. No distress.  HENT:  Head: Normocephalic and atraumatic.  Eyes: Conjunctivae and EOM are normal.  Cardiovascular: Normal rate and regular rhythm.   Pulmonary/Chest: Effort normal and breath sounds normal. No stridor. No respiratory distress.  Abdominal: She exhibits  no distension and no mass. There is no tenderness. There is no guarding.  Musculoskeletal: She exhibits no edema.  Neurological: She is alert and oriented to person, place, and time. She displays no atrophy, no tremor and normal reflexes. No cranial nerve deficit or sensory deficit. She exhibits normal muscle tone. She displays no seizure activity. Coordination normal.  Skin: Skin is warm and dry.  Psychiatric: She has a normal mood and affect.  Nursing note and vitals reviewed.    ED Treatments / Results  Labs (all labs ordered are listed, but only abnormal results are displayed) Labs Reviewed  CBC - Abnormal; Notable for the following:       Result Value   Hemoglobin 11.6 (*)    All other components within normal limits  BASIC METABOLIC PANEL  I-STAT TROPOININ, ED    EKG  EKG Interpretation  Date/Time:  Saturday Jun 10 2016 17:26:40 EDT Ventricular Rate:  70 PR Interval:  124 QRS Duration: 128 QT Interval:  420 QTC Calculation: 453 R Axis:   81 Text Interpretation:  Normal sinus rhythm Right bundle branch block No significant change since last tracing Abnormal ekg Confirmed by Carmin Muskrat [id number]) on 06/10/2016 8:20:43 PM       Radiology Dg Chest 2 View  Result Date: 06/10/2016 CLINICAL DATA:  Chest pain EXAM: CHEST  2 VIEW COMPARISON:  03/17/2016 chest radiograph. FINDINGS: Stable cardiomediastinal silhouette with normal heart size. No pneumothorax. No pleural effusion. Lungs appear clear, with no acute  consolidative airspace disease and no pulmonary edema. IMPRESSION: No active cardiopulmonary disease. Electronically Signed   By: Ilona Sorrel M.D.   On: 06/10/2016 18:21    Procedures Procedures (including critical care time)  Medications Ordered in ED Medications  acetaminophen (TYLENOL) tablet 1,000 mg (not administered)     Initial Impression / Assessment and Plan / ED Course  I have reviewed the triage vital signs and the nursing notes.  Pertinent labs & imaging results that were available during my care of the patient were reviewed by me and considered in my medical decision making (see chart for details).  11:11 PM On repeat exam the patient is awake and alert, distress, smiling, sitting upright. We discussed all findings including reassuring labs, x-rays. Given the patient's history of intermittent numbness to tingling, no surgical history in her back, no evidence for neurologic deficits, there suspicion for radiculopathy, no evidence for new cauda equina, or other decompensated state. Patient was started on a short course of steroids, which she notes she has previously taken without allergic reaction, follow-up with primary care.  Final Clinical Impressions(s) / ED Diagnoses  Numbness and tingling lumbosacralradiculopathy   Carmin Muskrat, MD 06/10/16 2314

## 2016-06-10 NOTE — ED Triage Notes (Signed)
Pt presents to ED after being seen for the same yesterday at [facility].  C/o feet and hand tingling and numbness, c/o back pain r/t sciatica, c/o chest pain, c/o generalized discomfort.

## 2016-06-10 NOTE — Discharge Instructions (Signed)
As discussed, your evaluation today has been largely reassuring.  But, it is important that you monitor your condition carefully, and do not hesitate to return to the ED if you develop new, or concerning changes in your condition. ? ?Otherwise, please follow-up with your physician for appropriate ongoing care. ? ?

## 2016-06-12 ENCOUNTER — Encounter: Payer: Self-pay | Admitting: Internal Medicine

## 2016-06-12 ENCOUNTER — Ambulatory Visit: Payer: BLUE CROSS/BLUE SHIELD

## 2016-06-12 ENCOUNTER — Ambulatory Visit (INDEPENDENT_AMBULATORY_CARE_PROVIDER_SITE_OTHER): Payer: BLUE CROSS/BLUE SHIELD | Admitting: Internal Medicine

## 2016-06-12 VITALS — BP 153/82 | HR 59 | Temp 98.2°F | Ht 63.0 in | Wt 218.0 lb

## 2016-06-12 DIAGNOSIS — M5441 Lumbago with sciatica, right side: Secondary | ICD-10-CM | POA: Diagnosis not present

## 2016-06-12 DIAGNOSIS — M5442 Lumbago with sciatica, left side: Secondary | ICD-10-CM | POA: Diagnosis not present

## 2016-06-12 DIAGNOSIS — M5387 Other specified dorsopathies, lumbosacral region: Secondary | ICD-10-CM

## 2016-06-12 MED ORDER — DULOXETINE HCL 30 MG PO CPEP: 30.0000 mg | ORAL_CAPSULE | Freq: Every day | ORAL | 0 refills | Status: DC

## 2016-06-12 NOTE — Assessment & Plan Note (Addendum)
Patient presents with bilateral lower extremity and upper extremity paresthesias. This seems to be an ongoing issue for the patient. It recently acutely worsened prompting her to present to the emergency department. At that time plain films of the lumbosacral spine were normal. Laboratory evaluation was also normal. She was discharged on a short course of steroids. She comes to clinic today with ongoing midline low back pain associated with bilateral lower extremity numbness and tingling. She also complains of upper extremity numbness and tingling in her bilateral hands. She states she has had an extensive neurological workup in the past including for multiple sclerosis and lupus all of which has been nonrevealing. Neurological examination shows 2+ hyperreflexia at the right patellar tendon with normal reflexes in the left patellar tendon. Sensation is grossly intact to fine touch and vibratory senses in the bilateral lower extremities. Strength grossly intact in the upper and lower extremities bilaterally. Patient does not appear to be in a large amount of pain. In the past for her neuropathic pain she has tried gabapentin and Lyrica both of which she failed secondary to medication side effects. I think she would still benefit from a medication for neuropathic pain and we will start duloxetine today. At this time she is also endorsing a feeling of urinary urgency when having to go to the bathroom but no gross paresthesia in the perianal area. However, when she has this urinary urgency and sits down she has a bowel movement and does not have the sensation as if she is having a bowel movement.  At this time, given the patient's new onset right-sided hyperreflexia and new symptoms of urinary urgency and possibly perirectal paresthesias I will refer her for stat MRI of the lumbar spine. I will also start duloxetine for long-term management of her chronic neuropathic pain symptoms. -- Stat MRI lumbar spine --  Duloxetine 30 mg once daily  Addendum  1. Multilevel mild to moderate facet arthrosis involving the lumbar spine, most severe at the L4-5 level bilaterally. Finding could serve as a source for lower back pain. 2. Otherwise unremarkable MRI of the lumbar spine. No significant degenerative disc disease. No stenosis or evidence for neural impingement.  Patient is adamant about referral to orthopedic surgery. This has been placed.

## 2016-06-12 NOTE — Patient Instructions (Signed)
It was a pleasure seeing you today. Thank you for choosing [name] for your healthcare needs.   I have ordered an MRI of your spine. We will notify you with the results.  I have started a new medication called duloxetine. Please take 1 pill once daily for the next 2 weeks. If you did not notice symptom improvement please call the clinic and we will increase the dose.  If your symptoms worsen or you develop urinary incontinence or loss of bowel or bladder function please go to the emergency department immediately.

## 2016-06-12 NOTE — Progress Notes (Signed)
   CC: Bilateral upper and lower extremity numbness and tingling HPI: [name] is a 56 y.o. female with a h/o of anemia, bowel obstruction, GERD, IBS and sciatica who presents with bilateral upper and lower extremity numbness and tingling.   Review of Systems: Endorses intermittent shortness of breath. Endorses nausea but no vomiting. Endorses bilateral upper and lower extremity numbness and tingling.   Physical Exam: There were no vitals filed for this visit. General appearance: alert and cooperative Head: Normocephalic, without obvious abnormality, atraumatic Lungs: clear to auscultation bilaterally Heart: regular rate and rhythm, S1, S2 normal, no murmur, click, rub or gallop Abdomen: soft, non-tender; bowel sounds normal; no masses,  no organomegaly Neurologic: Alert and oriented X 3, normal strength and tone. Normal symmetric reflexes. Normal coordination and gait Motor: grossly normal Patient has 2+ hyperreflexia in the right patellar tendon, normal reflexes in the left patellar tendon. Toes are downgoing bilaterally. Normal sensation to fine touch and vibration. Normal strength in the upper and lower extremities bilaterally.   Assessment & Plan:  See encounters tab for problem based medical decision making. Patient discussed with Dr. Angelia Mould  Signed: Ophelia Shoulder, MD 06/12/2016, 3:12 PM  Pager: [phone]

## 2016-06-13 ENCOUNTER — Telehealth: Payer: Self-pay | Admitting: Internal Medicine

## 2016-06-13 ENCOUNTER — Ambulatory Visit (HOSPITAL_COMMUNITY)
Admission: RE | Admit: 2016-06-13 | Discharge: 2016-06-13 | Disposition: A | Discharge status: Home / Self Care | Payer: BLUE CROSS/BLUE SHIELD | Source: Ambulatory Visit | Attending: Internal Medicine | Admitting: Internal Medicine

## 2016-06-13 ENCOUNTER — Encounter: Payer: Self-pay | Admitting: Internal Medicine

## 2016-06-13 ENCOUNTER — Ambulatory Visit (HOSPITAL_COMMUNITY): Admission: RE | Admit: 2016-06-13 | Payer: BLUE CROSS/BLUE SHIELD | Source: Ambulatory Visit

## 2016-06-13 DIAGNOSIS — Z885 Allergy status to narcotic agent status: Secondary | ICD-10-CM | POA: Diagnosis not present

## 2016-06-13 DIAGNOSIS — G600 Hereditary motor and sensory neuropathy: Secondary | ICD-10-CM | POA: Diagnosis not present

## 2016-06-13 DIAGNOSIS — M792 Neuralgia and neuritis, unspecified: Secondary | ICD-10-CM | POA: Diagnosis not present

## 2016-06-13 DIAGNOSIS — E669 Obesity, unspecified: Secondary | ICD-10-CM | POA: Diagnosis not present

## 2016-06-13 DIAGNOSIS — K219 Gastro-esophageal reflux disease without esophagitis: Secondary | ICD-10-CM | POA: Diagnosis not present

## 2016-06-13 DIAGNOSIS — M50221 Other cervical disc displacement at C4-C5 level: Secondary | ICD-10-CM | POA: Diagnosis not present

## 2016-06-13 DIAGNOSIS — D649 Anemia, unspecified: Secondary | ICD-10-CM | POA: Diagnosis not present

## 2016-06-13 DIAGNOSIS — M539 Dorsopathy, unspecified: Secondary | ICD-10-CM | POA: Insufficient documentation

## 2016-06-13 DIAGNOSIS — G379 Demyelinating disease of central nervous system, unspecified: Secondary | ICD-10-CM | POA: Diagnosis not present

## 2016-06-13 DIAGNOSIS — D72829 Elevated white blood cell count, unspecified: Secondary | ICD-10-CM | POA: Diagnosis not present

## 2016-06-13 DIAGNOSIS — E559 Vitamin D deficiency, unspecified: Secondary | ICD-10-CM | POA: Diagnosis not present

## 2016-06-13 DIAGNOSIS — M47816 Spondylosis without myelopathy or radiculopathy, lumbar region: Secondary | ICD-10-CM

## 2016-06-13 DIAGNOSIS — G4700 Insomnia, unspecified: Secondary | ICD-10-CM | POA: Diagnosis not present

## 2016-06-13 DIAGNOSIS — G054 Myelitis in diseases classified elsewhere: Secondary | ICD-10-CM | POA: Diagnosis not present

## 2016-06-13 DIAGNOSIS — R202 Paresthesia of skin: Secondary | ICD-10-CM | POA: Diagnosis not present

## 2016-06-13 DIAGNOSIS — R200 Anesthesia of skin: Secondary | ICD-10-CM | POA: Diagnosis not present

## 2016-06-13 DIAGNOSIS — R269 Unspecified abnormalities of gait and mobility: Secondary | ICD-10-CM | POA: Diagnosis not present

## 2016-06-13 DIAGNOSIS — E8809 Other disorders of plasma-protein metabolism, not elsewhere classified: Secondary | ICD-10-CM | POA: Diagnosis not present

## 2016-06-13 DIAGNOSIS — I10 Essential (primary) hypertension: Secondary | ICD-10-CM | POA: Diagnosis not present

## 2016-06-13 DIAGNOSIS — R339 Retention of urine, unspecified: Secondary | ICD-10-CM | POA: Diagnosis not present

## 2016-06-13 DIAGNOSIS — R208 Other disturbances of skin sensation: Secondary | ICD-10-CM | POA: Diagnosis not present

## 2016-06-13 DIAGNOSIS — Z8249 Family history of ischemic heart disease and other diseases of the circulatory system: Secondary | ICD-10-CM | POA: Diagnosis not present

## 2016-06-13 DIAGNOSIS — N319 Neuromuscular dysfunction of bladder, unspecified: Secondary | ICD-10-CM | POA: Diagnosis not present

## 2016-06-13 DIAGNOSIS — Z6839 Body mass index (BMI) 39.0-39.9, adult: Secondary | ICD-10-CM | POA: Diagnosis not present

## 2016-06-13 DIAGNOSIS — G35 Multiple sclerosis: Secondary | ICD-10-CM | POA: Diagnosis not present

## 2016-06-13 DIAGNOSIS — D638 Anemia in other chronic diseases classified elsewhere: Secondary | ICD-10-CM | POA: Diagnosis not present

## 2016-06-13 DIAGNOSIS — M545 Low back pain: Secondary | ICD-10-CM | POA: Diagnosis not present

## 2016-06-13 DIAGNOSIS — R251 Tremor, unspecified: Secondary | ICD-10-CM | POA: Diagnosis not present

## 2016-06-13 DIAGNOSIS — R2689 Other abnormalities of gait and mobility: Secondary | ICD-10-CM | POA: Diagnosis not present

## 2016-06-13 DIAGNOSIS — R29818 Other symptoms and signs involving the nervous system: Secondary | ICD-10-CM | POA: Diagnosis not present

## 2016-06-13 DIAGNOSIS — Z888 Allergy status to other drugs, medicaments and biological substances status: Secondary | ICD-10-CM | POA: Diagnosis not present

## 2016-06-13 DIAGNOSIS — G8921 Chronic pain due to trauma: Secondary | ICD-10-CM | POA: Diagnosis not present

## 2016-06-13 DIAGNOSIS — B370 Candidal stomatitis: Secondary | ICD-10-CM | POA: Diagnosis not present

## 2016-06-13 DIAGNOSIS — R609 Edema, unspecified: Secondary | ICD-10-CM | POA: Diagnosis not present

## 2016-06-13 DIAGNOSIS — Z881 Allergy status to other antibiotic agents status: Secondary | ICD-10-CM | POA: Diagnosis not present

## 2016-06-13 DIAGNOSIS — G373 Acute transverse myelitis in demyelinating disease of central nervous system: Secondary | ICD-10-CM | POA: Diagnosis not present

## 2016-06-13 DIAGNOSIS — G894 Chronic pain syndrome: Secondary | ICD-10-CM | POA: Diagnosis not present

## 2016-06-13 DIAGNOSIS — R739 Hyperglycemia, unspecified: Secondary | ICD-10-CM | POA: Diagnosis not present

## 2016-06-13 DIAGNOSIS — Z91048 Other nonmedicinal substance allergy status: Secondary | ICD-10-CM | POA: Diagnosis not present

## 2016-06-13 DIAGNOSIS — E119 Type 2 diabetes mellitus without complications: Secondary | ICD-10-CM | POA: Diagnosis not present

## 2016-06-13 DIAGNOSIS — R32 Unspecified urinary incontinence: Secondary | ICD-10-CM | POA: Diagnosis not present

## 2016-06-13 DIAGNOSIS — M5430 Sciatica, unspecified side: Secondary | ICD-10-CM | POA: Diagnosis not present

## 2016-06-13 DIAGNOSIS — K592 Neurogenic bowel, not elsewhere classified: Secondary | ICD-10-CM | POA: Diagnosis not present

## 2016-06-13 DIAGNOSIS — Z8674 Personal history of sudden cardiac arrest: Secondary | ICD-10-CM | POA: Diagnosis not present

## 2016-06-13 DIAGNOSIS — Z79899 Other long term (current) drug therapy: Secondary | ICD-10-CM | POA: Diagnosis not present

## 2016-06-13 DIAGNOSIS — M542 Cervicalgia: Secondary | ICD-10-CM | POA: Diagnosis not present

## 2016-06-13 DIAGNOSIS — Z880 Allergy status to penicillin: Secondary | ICD-10-CM | POA: Diagnosis not present

## 2016-06-13 DIAGNOSIS — K589 Irritable bowel syndrome without diarrhea: Secondary | ICD-10-CM | POA: Diagnosis not present

## 2016-06-13 DIAGNOSIS — M549 Dorsalgia, unspecified: Secondary | ICD-10-CM | POA: Diagnosis not present

## 2016-06-13 DIAGNOSIS — G0489 Other myelitis: Secondary | ICD-10-CM | POA: Diagnosis not present

## 2016-06-13 DIAGNOSIS — Z90710 Acquired absence of both cervix and uterus: Secondary | ICD-10-CM | POA: Diagnosis not present

## 2016-06-13 DIAGNOSIS — T380X5A Adverse effect of glucocorticoids and synthetic analogues, initial encounter: Secondary | ICD-10-CM | POA: Diagnosis not present

## 2016-06-13 NOTE — Addendum Note (Signed)
Addended by: Deirdre Evener on: 06/13/2016 04:20 PM   Modules accepted: Orders

## 2016-06-13 NOTE — Telephone Encounter (Signed)
I have tried to call her multiple times on all numbers listed. Which number did she call from?

## 2016-06-13 NOTE — Progress Notes (Signed)
Internal Medicine Clinic Attending  Case discussed with Dr. Taylor at the time of the visit.  We reviewed the resident's history and exam and pertinent patient test results.  I agree with the assessment, diagnosis, and plan of care documented in the resident's note. 

## 2016-06-13 NOTE — Telephone Encounter (Signed)
   Reason for call:   I received a call from Ms. Amory Rieman at 5:00 PM indicating she was told her appointment at [facility] could not be scheduled any time soon and she needs a different referral.   Pertinent Data:   She underwent lumbar spine MRI showing L4/5 disease and continues to have severe back pain plus gait difficulty.  Her appointment with [facility] is scheduled for 08/04/16   Assessment / Plan / Recommendations:   I will defer referral of this patient to an alternative practice or reassessment back to Dr. Lovena Le at her request, as she feels he and Dr. Heber Dunkirk are most aware of her medical problem.  As always, pt is advised that if symptoms worsen or new symptoms arise, they should go to an urgent care facility or to to ER for further evaluation.    Collier Salina, MD PGY-II Internal Medicine Resident Pager# [phone] 06/13/2016, 5:34 PM

## 2016-06-13 NOTE — Telephone Encounter (Signed)
WANTS MRI RESULTS, PAIN IS GETTING WORSE

## 2016-06-14 ENCOUNTER — Emergency Department (HOSPITAL_COMMUNITY): Payer: BLUE CROSS/BLUE SHIELD

## 2016-06-14 ENCOUNTER — Inpatient Hospital Stay (HOSPITAL_COMMUNITY)
Admission: EM | Admit: 2016-06-14 | Discharge: 2016-06-19 | DRG: 058 | Disposition: A | Discharge status: Rehabilitation Facility | Payer: BLUE CROSS/BLUE SHIELD | Attending: Oncology | Admitting: Oncology

## 2016-06-14 ENCOUNTER — Telehealth: Payer: Self-pay | Admitting: Internal Medicine

## 2016-06-14 ENCOUNTER — Encounter (HOSPITAL_COMMUNITY): Payer: Self-pay | Admitting: Emergency Medicine

## 2016-06-14 ENCOUNTER — Telehealth: Payer: Self-pay | Admitting: Pulmonary Disease

## 2016-06-14 DIAGNOSIS — Z90710 Acquired absence of both cervix and uterus: Secondary | ICD-10-CM

## 2016-06-14 DIAGNOSIS — R32 Unspecified urinary incontinence: Secondary | ICD-10-CM | POA: Diagnosis present

## 2016-06-14 DIAGNOSIS — G373 Acute transverse myelitis in demyelinating disease of central nervous system: Secondary | ICD-10-CM | POA: Insufficient documentation

## 2016-06-14 DIAGNOSIS — R739 Hyperglycemia, unspecified: Secondary | ICD-10-CM

## 2016-06-14 DIAGNOSIS — G8921 Chronic pain due to trauma: Secondary | ICD-10-CM

## 2016-06-14 DIAGNOSIS — Z888 Allergy status to other drugs, medicaments and biological substances status: Secondary | ICD-10-CM

## 2016-06-14 DIAGNOSIS — R269 Unspecified abnormalities of gait and mobility: Secondary | ICD-10-CM

## 2016-06-14 DIAGNOSIS — G054 Myelitis in diseases classified elsewhere: Secondary | ICD-10-CM | POA: Diagnosis present

## 2016-06-14 DIAGNOSIS — K592 Neurogenic bowel, not elsewhere classified: Secondary | ICD-10-CM

## 2016-06-14 DIAGNOSIS — M549 Dorsalgia, unspecified: Secondary | ICD-10-CM | POA: Diagnosis not present

## 2016-06-14 DIAGNOSIS — G35 Multiple sclerosis: Secondary | ICD-10-CM | POA: Diagnosis not present

## 2016-06-14 DIAGNOSIS — Z881 Allergy status to other antibiotic agents status: Secondary | ICD-10-CM

## 2016-06-14 DIAGNOSIS — D72829 Elevated white blood cell count, unspecified: Secondary | ICD-10-CM

## 2016-06-14 DIAGNOSIS — E119 Type 2 diabetes mellitus without complications: Secondary | ICD-10-CM

## 2016-06-14 DIAGNOSIS — E669 Obesity, unspecified: Secondary | ICD-10-CM | POA: Diagnosis present

## 2016-06-14 DIAGNOSIS — M545 Low back pain: Secondary | ICD-10-CM

## 2016-06-14 DIAGNOSIS — R208 Other disturbances of skin sensation: Secondary | ICD-10-CM

## 2016-06-14 DIAGNOSIS — R200 Anesthesia of skin: Secondary | ICD-10-CM | POA: Diagnosis not present

## 2016-06-14 DIAGNOSIS — Z8674 Personal history of sudden cardiac arrest: Secondary | ICD-10-CM

## 2016-06-14 DIAGNOSIS — N319 Neuromuscular dysfunction of bladder, unspecified: Secondary | ICD-10-CM

## 2016-06-14 DIAGNOSIS — M542 Cervicalgia: Secondary | ICD-10-CM | POA: Diagnosis not present

## 2016-06-14 DIAGNOSIS — Z8249 Family history of ischemic heart disease and other diseases of the circulatory system: Secondary | ICD-10-CM

## 2016-06-14 DIAGNOSIS — D638 Anemia in other chronic diseases classified elsewhere: Secondary | ICD-10-CM

## 2016-06-14 DIAGNOSIS — R202 Paresthesia of skin: Secondary | ICD-10-CM | POA: Diagnosis not present

## 2016-06-14 DIAGNOSIS — Z91048 Other nonmedicinal substance allergy status: Secondary | ICD-10-CM

## 2016-06-14 DIAGNOSIS — Z8379 Family history of other diseases of the digestive system: Secondary | ICD-10-CM

## 2016-06-14 DIAGNOSIS — G4700 Insomnia, unspecified: Secondary | ICD-10-CM | POA: Diagnosis not present

## 2016-06-14 DIAGNOSIS — K589 Irritable bowel syndrome without diarrhea: Secondary | ICD-10-CM | POA: Diagnosis present

## 2016-06-14 DIAGNOSIS — K219 Gastro-esophageal reflux disease without esophagitis: Secondary | ICD-10-CM | POA: Diagnosis present

## 2016-06-14 DIAGNOSIS — T380X5A Adverse effect of glucocorticoids and synthetic analogues, initial encounter: Secondary | ICD-10-CM | POA: Diagnosis not present

## 2016-06-14 DIAGNOSIS — I10 Essential (primary) hypertension: Secondary | ICD-10-CM

## 2016-06-14 DIAGNOSIS — Z885 Allergy status to narcotic agent status: Secondary | ICD-10-CM

## 2016-06-14 DIAGNOSIS — G894 Chronic pain syndrome: Secondary | ICD-10-CM | POA: Diagnosis present

## 2016-06-14 DIAGNOSIS — M792 Neuralgia and neuritis, unspecified: Secondary | ICD-10-CM

## 2016-06-14 DIAGNOSIS — Z79899 Other long term (current) drug therapy: Secondary | ICD-10-CM

## 2016-06-14 DIAGNOSIS — Z800 Family history of malignant neoplasm of digestive organs: Secondary | ICD-10-CM

## 2016-06-14 DIAGNOSIS — Z6839 Body mass index (BMI) 39.0-39.9, adult: Secondary | ICD-10-CM

## 2016-06-14 DIAGNOSIS — M5430 Sciatica, unspecified side: Secondary | ICD-10-CM | POA: Diagnosis present

## 2016-06-14 DIAGNOSIS — Z880 Allergy status to penicillin: Secondary | ICD-10-CM

## 2016-06-14 LAB — BASIC METABOLIC PANEL
ANION GAP: 7 (ref 5–15)
BUN: 13 mg/dL (ref 6–20)
CALCIUM: 9 mg/dL (ref 8.9–10.3)
CO2: 26 mmol/L (ref 22–32)
Chloride: 106 mmol/L (ref 101–111)
Creatinine, Ser: 0.92 mg/dL (ref 0.44–1.00)
Glucose, Bld: 111 mg/dL — ABNORMAL HIGH (ref 65–99)
POTASSIUM: 3.7 mmol/L (ref 3.5–5.1)
SODIUM: 139 mmol/L (ref 135–145)

## 2016-06-14 LAB — CBC
HEMATOCRIT: 36.9 % (ref 36.0–46.0)
HEMOGLOBIN: 11.7 g/dL — AB (ref 12.0–15.0)
MCH: 27.7 pg (ref 26.0–34.0)
MCHC: 31.7 g/dL (ref 30.0–36.0)
MCV: 87.2 fL (ref 78.0–100.0)
Platelets: 199 10*3/uL (ref 150–400)
RBC: 4.23 MIL/uL (ref 3.87–5.11)
RDW: 14.5 % (ref 11.5–15.5)
WBC: 10.6 10*3/uL — ABNORMAL HIGH (ref 4.0–10.5)

## 2016-06-14 LAB — URINALYSIS, ROUTINE W REFLEX MICROSCOPIC
BILIRUBIN URINE: NEGATIVE
Bacteria, UA: NONE SEEN
Glucose, UA: NEGATIVE mg/dL
Ketones, ur: NEGATIVE mg/dL
LEUKOCYTES UA: NEGATIVE
NITRITE: NEGATIVE
Protein, ur: NEGATIVE mg/dL
SPECIFIC GRAVITY, URINE: 1.009 (ref 1.005–1.030)
Squamous Epithelial / LPF: NONE SEEN
pH: 5 (ref 5.0–8.0)

## 2016-06-14 LAB — TROPONIN I

## 2016-06-14 LAB — BRAIN NATRIURETIC PEPTIDE: B NATRIURETIC PEPTIDE 5: 51.6 pg/mL (ref 0.0–100.0)

## 2016-06-14 MED ORDER — SODIUM CHLORIDE 0.9 % IV SOLN
500.0000 mg | Freq: Once | INTRAVENOUS | Status: DC
Start: 2016-06-14 — End: 2016-06-15
  Filled 2016-06-14: qty 4

## 2016-06-14 MED ORDER — PANTOPRAZOLE SODIUM 40 MG PO TBEC
40.0000 mg | DELAYED_RELEASE_TABLET | Freq: Every day | ORAL | Status: DC
  Administered 2016-06-15 – 2016-06-19 (×5): 40 mg via ORAL
  Filled 2016-06-14 (×5): qty 1

## 2016-06-14 MED ORDER — INSULIN ASPART 100 UNIT/ML ~~LOC~~ SOLN: 0.0000 [IU] | Freq: Every day | SUBCUTANEOUS | Status: DC

## 2016-06-14 MED ORDER — INSULIN ASPART 100 UNIT/ML ~~LOC~~ SOLN
0.0000 [IU] | Freq: Three times a day (TID) | SUBCUTANEOUS | Status: DC
  Administered 2016-06-16 – 2016-06-17 (×3): 1 [IU] via SUBCUTANEOUS

## 2016-06-14 MED ORDER — GADOBENATE DIMEGLUMINE 529 MG/ML IV SOLN
20.0000 mL | Freq: Once | INTRAVENOUS | Status: AC | PRN
  Administered 2016-06-14: 20 mL via INTRAVENOUS

## 2016-06-14 MED ORDER — SODIUM CHLORIDE 0.9 % IV SOLN
500.0000 mg | Freq: Two times a day (BID) | INTRAVENOUS | Status: AC
  Administered 2016-06-15 – 2016-06-17 (×6): 500 mg via INTRAVENOUS
  Filled 2016-06-14 (×7): qty 4

## 2016-06-14 MED ORDER — LORAZEPAM 2 MG/ML IJ SOLN
1.0000 mg | Freq: Once | INTRAMUSCULAR | Status: AC
  Administered 2016-06-14: 1 mg via INTRAVENOUS
  Filled 2016-06-14: qty 1

## 2016-06-14 MED ORDER — ENOXAPARIN SODIUM 40 MG/0.4ML ~~LOC~~ SOLN
40.0000 mg | Freq: Every day | SUBCUTANEOUS | Status: DC
  Filled 2016-06-14: qty 0.4

## 2016-06-14 MED ORDER — ACETAMINOPHEN 325 MG PO TABS
650.0000 mg | ORAL_TABLET | Freq: Once | ORAL | Status: AC
  Administered 2016-06-14: 650 mg via ORAL
  Filled 2016-06-14: qty 2
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
## 2016-06-14 NOTE — ED Notes (Signed)
Pt returned from MRI °

## 2016-06-14 NOTE — ED Provider Notes (Addendum)
[facility] DEPT Provider Note   CSN: [id number] Arrival date & time: 06/14/16  1331     History   Chief Complaint Chief Complaint  Patient presents with  . Numbness  . Urinary Retention    HPI Tamara [name] is a 56 y.o. female.  HPI   56 yo F here with a few days of progressively worsening weakness, paresthesias in BLE and now associated with decreased urination. No new complaints. No recent illnesses. No fam hx of autoimmune disease. No medications, alcohol, tobacco, drugs. No h/o same. Seen by [facility] and MRI lumbar wo done yesterday and normal.  No other associated or modifying sy mptoms.   Past Medical History:  Diagnosis Date  . Anemia   . Bowel obstruction ([place])   . GERD (gastroesophageal reflux disease)   . IBS (irritable bowel syndrome)    at age of 14  . Sciatica 2009    Patient Active Problem List   Diagnosis Date Noted  . Allergic rhinitis 04/12/2016  . Medication overuse headache 03/17/2016  . Elevated blood pressure reading in office without diagnosis of hypertension 03/13/2016  . Otitis media 02/23/2014  . Sciatica associated with disorder of lumbosacral spine 01/15/2014  . Edema 01/15/2014  . Headache(784.0) 07/21/2013  . GERD (gastroesophageal reflux disease) 07/21/2013  . RBBB on EKG 08/01/2012  . IBS (irritable bowel syndrome) 06/19/2012  . Healthcare maintenance 06/19/2012    Past Surgical History:  Procedure Laterality Date  . ABDOMINAL HYSTERECTOMY  2008   cervix and right ovary still intact  . KNEE ARTHROSCOPY  2010 and 2011   Left knee, x2    OB History    Gravida Para Term Preterm AB Living   2 2 2  0 0 2   SAB TAB Ectopic Multiple Live Births   0 0 0 0         Home Medications    Prior to Admission medications   Medication Sig Start Date End Date Taking? Authorizing Provider  ACAI BERRY PO Take 1 capsule by mouth daily as needed (for vitamin).    [provider]  b complex vitamins tablet Take 1 tablet by mouth  daily. 06/19/12   Neta Ehlers, MD  benzocaine-menthol (CHLORAEPTIC) 6-10 MG lozenge Take 1 lozenge by mouth as needed for sore throat. 01/22/15   Burns, Arloa Koh, MD  Biotin w/ Vitamins C & E (HAIR/SKIN/NAILS PO) Take 1 tablet by mouth daily.    [provider]  Camphor-Eucalyptus-Menthol (VICKS VAPORUB) 4.73-1.2-2.6 % OINT Apply 1 application topically daily as needed (for feet).    [provider]  cetirizine (ZYRTEC ALLERGY) 10 MG tablet Take 1 tablet (10 mg total) by mouth daily. Patient taking differently: Take 10 mg by mouth daily as needed for allergies.  04/12/16   Dellia Nims, MD  cholecalciferol (VITAMIN D) 400 UNITS TABS tablet Take 400 Units by mouth daily.     [provider]  DULoxetine (CYMBALTA) 30 MG capsule Take 1 capsule (30 mg total) by mouth daily. 06/12/16   Ophelia Shoulder, MD  fluticasone (FLONASE) 50 MCG/ACT nasal spray Place 2 sprays into both nostrils daily. Patient taking differently: Place 2 sprays into both nostrils daily as needed for allergies.  03/13/16   Burns, Arloa Koh, MD  furosemide (LASIX) 20 MG tablet Take 1 tablet (20 mg total) by mouth daily as needed for fluid or edema. 01/15/14   Burns, Arloa Koh, MD  GARCINIA CAMBOGIA-CHROMIUM PO Take 1 capsule by mouth daily as needed (for vitamin).  [provider]  guaiFENesin (MUCINEX) 600 MG 12 hr tablet Take 1 tablet (600 mg total) by mouth 2 (two) times daily. Patient taking differently: Take 600 mg by mouth 2 (two) times daily as needed for cough or to loosen phlegm.  03/13/16   Burns, Arloa Koh, MD  guaiFENesin-codeine 100-10 MG/5ML syrup Take 5 mLs by mouth every 4 (four) hours as needed for cough. 03/17/16   Asencion Partridge, MD  ibuprofen (ADVIL,MOTRIN) 800 MG tablet Take 1 tablet (800 mg total) by mouth every 8 (eight) hours as needed. 06/10/16   Carmin Muskrat, MD  Multiple Vitamin (MULTIVITAMIN) tablet Take 1 tablet by mouth daily. 06/19/12   Neta Ehlers, MD  Omega-3 Fatty Acids  (FISH OIL) 1000 MG CAPS Take 1,000-2,000 mg by mouth 2 (two) times daily. Take 2 capsules in the morning and take 1 capsule at bedtime    [provider]  oxymetazoline (AFRIN NASAL SPRAY) 0.05 % nasal spray Place 1 spray into both nostrils 2 (two) times daily. Patient taking differently: Place 1 spray into both nostrils 2 (two) times daily as needed for congestion.  04/12/16   Ahmed, Chesley Mires, MD  Polyethyl Glycol-Propyl Glycol (SYSTANE) 0.4-0.3 % SOLN Place 1 drop into both eyes daily as needed (for dry eyes).    [provider]  Potassium 75 MG TABS Take 1 tablet by mouth daily.     [provider]  predniSONE (DELTASONE) 20 MG tablet Take 2 tablets (40 mg total) by mouth daily with breakfast. For the next four days 06/10/16   Carmin Muskrat, MD  vitamin E 100 UNIT capsule Take 100 Units by mouth daily.    [provider]    Family History Family History  Problem Relation Age of Onset  . Cancer Father        Gallbladder  . Gallbladder disease Father   . Hypertension Brother   . Gallbladder disease Sister        x3  . Gallbladder disease Paternal Grandmother   . Colon cancer Neg Hx   . Colon polyps Neg Hx   . Esophageal cancer Neg Hx   . Kidney disease Neg Hx     Social History Social History  Substance Use Topics  . Smoking status: Never Smoker  . Smokeless tobacco: Never Used  . Alcohol use No     Allergies   Codeine; Prednisone; Dilaudid [hydromorphone hcl]; Paxil [paroxetine hcl]; Adhesive [tape]; and Augmentin [amoxicillin-pot clavulanate]   Review of Systems Review of Systems  All other systems reviewed and are negative.    Physical Exam Updated Vital Signs BP 140/82 (BP Location: Left Arm)   Pulse 69   Temp 99.8 F (37.7 C) (Oral)   Resp 18   Ht 5\' 3"  (1.6 m)   Wt 220 lb (99.8 kg)   SpO2 100%   BMI 38.97 kg/m   Physical Exam  Constitutional: She is oriented to person, place, and time. She appears well-developed and  well-nourished.  HENT:  Head: Normocephalic and atraumatic.  Eyes: Conjunctivae and EOM are normal.  Neck: Normal range of motion.  Cardiovascular: Normal rate and regular rhythm.   Pulmonary/Chest: Effort normal and breath sounds normal. No stridor. No respiratory distress.  Abdominal: Soft. She exhibits no distension.  Musculoskeletal: Normal range of motion. She exhibits no edema or deformity.  No altered mental status, able to give full seemingly accurate history.  Face is symmetric, EOM's intact, pupils equal and reactive, vision intact, tongue and uvula midline without  deviation Upper and Lower extremity motor 5/5, intact pain perception in distal extremities, 2+ reflexes in biceps, patella and achilles tendons. Finger to nose normal, heel to shin normal.   Neurological: She is alert and oriented to person, place, and time. No cranial nerve deficit.  No altered mental status, able to give full seemingly accurate history.  Face is symmetric, EOM's intact, pupils equal and reactive, vision intact, tongue and uvula midline without deviation Upper and Lower extremity motor 5/5, intact pain perception in distal extremities, 2+ reflexes in biceps, patella and achilles tendons.  Skin: Skin is warm and dry. No erythema. No pallor.  Nursing note and vitals reviewed.    ED Treatments / Results  Labs (all labs ordered are listed, but only abnormal results are displayed) Labs Reviewed  CBC - Abnormal; Notable for the following:       Result Value   WBC 10.6 (*)    Hemoglobin 11.7 (*)    All other components within normal limits  BASIC METABOLIC PANEL - Abnormal; Notable for the following:    Glucose, Bld 111 (*)    All other components within normal limits  URINALYSIS, ROUTINE W REFLEX MICROSCOPIC - Abnormal; Notable for the following:    Color, Urine STRAW (*)    Hgb urine dipstick SMALL (*)    All other components within normal limits  TROPONIN I  BRAIN NATRIURETIC PEPTIDE     EKG  EKG Interpretation None       Radiology Mr Lumbar Spine Wo Contrast  Result Date: 06/13/2016 CLINICAL DATA:  Initial evaluation for acute pain with hyper reflexia. Numbness and tingling and lumbar region. EXAM: MRI LUMBAR SPINE WITHOUT CONTRAST TECHNIQUE: Multiplanar, multisequence MR imaging of the lumbar spine was performed. No intravenous contrast was administered. COMPARISON:  Prior radiograph from 06/10/2016. FINDINGS: Segmentation: Normal segmentation. Lowest well-formed disc is labeled the L5-S1 level. Alignment: Vertebral bodies normally aligned with preservation of the normal lumbar lordosis. No listhesis. Vertebrae: Vertebral body heights are well maintained. No evidence for acute or chronic fracture. Signal intensity within the vertebral body bone marrow within normal limits. No discrete or worrisome osseous lesions. Conus medullaris: Extends to the L1-2 level and appears normal. Paraspinal and other soft tissues: Paraspinous soft tissues within normal limits. Visualized fissure structures are unremarkable. Disc levels: L1-2: Normal interspace. Mild facet and ligamentum flavum hypertrophy. No canal or neural foraminal stenosis. L2-3: Normal interspace. Mild facet and ligamentum flavum hypertrophy. No canal or neural foraminal stenosis. L3-4: Normal interspace. Moderate facet arthrosis with ligamentum flavum hypertrophy. No significant canal or neural foraminal stenosis. L4-5: Normal interspace. Moderate facet arthrosis with ligamentum flavum hypertrophy. Mild reactive edema about the bilateral L4-5 facets. No significant canal or neural foraminal stenosis. L5-S1: Normal interspace. Mild bilateral facet arthrosis. No significant canal or neural foraminal stenosis. IMPRESSION: 1. Multilevel mild to moderate facet arthrosis involving the lumbar spine, most severe at the L4-5 level bilaterally. Finding could serve as a source for lower back pain. 2. Otherwise unremarkable MRI of the  lumbar spine. No significant degenerative disc disease. No stenosis or evidence for neural impingement. Electronically Signed   By: Jeannine Boga M.D.   On: 06/13/2016 12:51    Procedures Procedures (including critical care time)  Medications Ordered in ED Medications  acetaminophen (TYLENOL) tablet 650 mg (not administered)  LORazepam (ATIVAN) injection 1 mg (not administered)     Initial Impression / Assessment and Plan / ED Course  I have reviewed the triage vital signs and the nursing  notes.  Pertinent labs & imaging results that were available during my care of the patient were reviewed by me and considered in my medical decision making (see chart for details).    Will eval for MS. Considered guillian barre but normal reflexes in ankles and patella. Also with leg swelling so will check bnp/trop/ecg. Workup c/w MS. Discussed with Neurology who recommends high dose steroids and admission for 3 days of same.   Final Clinical Impressions(s) / ED Diagnoses   Final diagnoses:  Back pain  Back pain    New Prescriptions New Prescriptions   No medications on file     Serenity Batley, Corene Cornea, MD 06/16/16 [place]    Luisdaniel Kenton, Corene Cornea, MD 06/16/16 1132

## 2016-06-14 NOTE — ED Notes (Signed)
Delay in lab draw MD at bedside. 

## 2016-06-14 NOTE — Addendum Note (Signed)
Addended by: Deirdre Evener on: 06/14/2016 10:07 AM   Modules accepted: Orders

## 2016-06-14 NOTE — Consult Note (Signed)
Admission H&P    Chief Complaint: Numbness and tingling involving extremities and trunk.  HPI: Tamara [name] is an 56 y.o. female with a history of sciatica, irritable bowel syndrome, GERD and anemia, presenting with progressive numbness involving her trunk from the chest downward including lower extremities as well as numbness involving both hands. Onset of symptoms was on 06/09/2016. Loss of sensation includes sacral numbness. She's had no incontinence of urine or stool. MRI of the brain showed multiple subcentimeter T2 abnormalities indicative of probable demyelinating disease. MRI of cervical spine showed an acute 11 mm demyelinating lesion at the 3, and a smaller chronic lesion at C5. Thoracic spine showed no evidence of demyelination. She has no previous symptoms of numbness and tingling of that and symptoms associated with sciatica. She's had no visual changes nor abnormalities of coordination.  Past Medical History:  Diagnosis Date  . Anemia   . Bowel obstruction ([place])   . GERD (gastroesophageal reflux disease)   . IBS (irritable bowel syndrome)    at age of 81  . Sciatica 2009    Past Surgical History:  Procedure Laterality Date  . ABDOMINAL HYSTERECTOMY  2008   cervix and right ovary still intact  . KNEE ARTHROSCOPY  2010 and 2011   Left knee, x2    Family History  Problem Relation Age of Onset  . Cancer Father        Gallbladder  . Gallbladder disease Father   . Hypertension Brother   . Gallbladder disease Sister        x3  . Gallbladder disease Paternal Grandmother   . Colon cancer Neg Hx   . Colon polyps Neg Hx   . Esophageal cancer Neg Hx   . Kidney disease Neg Hx    Social History:  reports that she has never smoked. She has never used smokeless tobacco. She reports that she does not drink alcohol or use drugs.  Allergies:  Allergies  Allergen Reactions  . Codeine Other (See Comments)    Delusions  . Prednisone Hives, Itching and Swelling  . Dilaudid  [Hydromorphone Hcl] Swelling    Tongue swells   . Paxil [Paroxetine Hcl] Other (See Comments)    Hallucinations and heavy periods   . Adhesive [Tape] Hives, Itching and Rash    PAPER TAPE=   . Augmentin [Amoxicillin-Pot Clavulanate] Rash    Medications: Preadmission medications were reviewed by me.  ROS: History obtained from the patient  General ROS: negative for - chills, fatigue, fever, night sweats, weight gain or weight loss Psychological ROS: negative for - behavioral disorder, hallucinations, memory difficulties, mood swings or suicidal ideation Ophthalmic ROS: negative for - blurry vision, double vision, eye pain or loss of vision ENT ROS: negative for - epistaxis, nasal discharge, oral lesions, sore throat, tinnitus or vertigo Allergy and Immunology ROS: negative for - hives or itchy/watery eyes Hematological and Lymphatic ROS: negative for - bleeding problems, bruising or swollen lymph nodes Endocrine ROS: negative for - galactorrhea, hair pattern changes, polydipsia/polyuria or temperature intolerance Respiratory ROS: negative for - cough, hemoptysis, shortness of breath or wheezing Cardiovascular ROS: negative for - chest pain, dyspnea on exertion, edema or irregular heartbeat Gastrointestinal ROS: negative for - abdominal pain, diarrhea, hematemesis, nausea/vomiting or stool incontinence Genito-Urinary ROS: Sacral numbness of new-onset Musculoskeletal ROS: negative for - joint swelling or muscular weakness Neurological ROS: as noted in HPI Dermatological ROS: negative for rash and skin lesion changes  Physical Examination: Blood pressure 129/65, pulse 60, temperature 99.8 F (  37.7 C), temperature source Oral, resp. rate 12, height 5' 3" (1.6 m), weight 99.8 kg (220 lb), SpO2 98 %.  HEENT-  Normocephalic, no lesions, without obvious abnormality.  Normal external eye and conjunctiva.  Normal TM's bilaterally.  Normal auditory canals and external ears. Normal external  nose, mucus membranes and septum.  Normal pharynx. Neck supple with no masses, nodes, nodules or enlargement. Cardiovascular - regular rate and rhythm, S1, S2 normal, no murmur, click, rub or gallop Lungs - chest clear, no wheezing, rales, normal symmetric air entry Abdomen - soft, non-tender; bowel sounds normal; no masses,  no organomegaly Extremities - no joint deformities, effusion, or inflammation and no edema  Neurologic Examination: Mental Status: Alert, oriented, thought content appropriate.  Speech fluent without evidence of aphasia. Able to follow commands without difficulty. Cranial Nerves: II-Visual fields were normal. III/IV/VI-Pupils were equal and reacted. Extraocular movements were full and conjugate.    V/VII-no facial numbness and no facial weakness. VIII-normal. X-normal speech and symmetrical palatal movement. XI: trapezius strength/neck flexion strength normal bilaterally XII-midline tongue extension with normal strength. Motor: 5/5 bilaterally with normal tone and bulk Sensory: Reduced perception of tactile sensation from T3 and below bilaterally. Deep Tendon Reflexes: Diffusely hyperreflexic. Plantars: Mute bilaterally Cerebellar: Mild coordination difficulty with finger-nose testing bilaterally. Carotid auscultation: Normal  Results for orders placed or performed during the hospital encounter of 06/14/16 (from the past 48 hour(s))  CBC     Status: Abnormal   Collection Time: 06/14/16  2:35 PM  Result Value Ref Range   WBC 10.6 (H) 4.0 - 10.5 K/uL   RBC 4.23 3.87 - 5.11 MIL/uL   Hemoglobin 11.7 (L) 12.0 - 15.0 g/dL   HCT 36.9 36.0 - 46.0 %   MCV 87.2 78.0 - 100.0 fL   MCH 27.7 26.0 - 34.0 pg   MCHC 31.7 30.0 - 36.0 g/dL   RDW 14.5 11.5 - 15.5 %   Platelets 199 150 - 400 K/uL  Basic metabolic panel     Status: Abnormal   Collection Time: 06/14/16  2:35 PM  Result Value Ref Range   Sodium 139 135 - 145 mmol/L   Potassium 3.7 3.5 - 5.1 mmol/L   Chloride  106 101 - 111 mmol/L   CO2 26 22 - 32 mmol/L   Glucose, Bld 111 (H) 65 - 99 mg/dL   BUN 13 6 - 20 mg/dL   Creatinine, Ser 0.92 0.44 - 1.00 mg/dL   Calcium 9.0 8.9 - 10.3 mg/dL   GFR calc non Af Amer >60 >60 mL/min   GFR calc Af Amer >60 >60 mL/min    Comment: (NOTE) The eGFR has been calculated using the CKD EPI equation. This calculation has not been validated in all clinical situations. eGFR's persistently <60 mL/min signify possible Chronic Kidney Disease.    Anion gap 7 5 - 15  Urinalysis, Routine w reflex microscopic     Status: Abnormal   Collection Time: 06/14/16  3:29 PM  Result Value Ref Range   Color, Urine STRAW (A) YELLOW   APPearance CLEAR CLEAR   Specific Gravity, Urine 1.009 1.005 - 1.030   pH 5.0 5.0 - 8.0   Glucose, UA NEGATIVE NEGATIVE mg/dL   Hgb urine dipstick SMALL (A) NEGATIVE   Bilirubin Urine NEGATIVE NEGATIVE   Ketones, ur NEGATIVE NEGATIVE mg/dL   Protein, ur NEGATIVE NEGATIVE mg/dL   Nitrite NEGATIVE NEGATIVE   Leukocytes, UA NEGATIVE NEGATIVE   RBC / HPF 0-5 0 - 5 RBC/hpf  WBC, UA 0-5 0 - 5 WBC/hpf   Bacteria, UA NONE SEEN NONE SEEN   Squamous Epithelial / LPF NONE SEEN NONE SEEN   Mucous PRESENT   Brain natriuretic peptide     Status: None   Collection Time: 06/14/16  5:35 PM  Result Value Ref Range   B Natriuretic Peptide 51.6 0.0 - 100.0 pg/mL  Troponin I     Status: None   Collection Time: 06/14/16  5:35 PM  Result Value Ref Range   Troponin I <0.03 <0.03 ng/mL   Mr Jeri Cos And Wo Contrast  Result Date: 06/14/2016 CLINICAL DATA:  Bilateral hand numbness, leg heaviness and urinary retention for 4 days. EXAM: MRI HEAD WITHOUT AND WITH CONTRAST TECHNIQUE: Multiplanar, multiecho pulse sequences of the brain and surrounding structures were obtained without and with intravenous contrast. CONTRAST:  19m MULTIHANCE GADOBENATE DIMEGLUMINE 529 MG/ML IV SOLN COMPARISON:  None. FINDINGS: INTRACRANIAL CONTENTS: No reduced diffusion to suggest acute  ischemia or hyperacute demyelination. No susceptibility artifact to suggest hemorrhage. The ventricles and sulci are normal for patient's age. 9 mm ovoid T2 hyperintensity RIGHT parietal white matter with low T1 signal in T2 shine through. 4 additional subcentimeter white matter T2 hyperintense lesions including RIGHT frontal horn periatrial white matter. Linear LEFT parietal perivascular space. No masses, mass effect. No abnormal intraparenchymal or extra-axial enhancement. No abnormal extra-axial fluid collections. No extra-axial masses. VASCULAR: Normal major intracranial vascular flow voids present at skull base. SKULL AND UPPER CERVICAL SPINE: No abnormal sellar expansion. No suspicious calvarial bone marrow signal. Craniocervical junction maintained. SINUSES/ORBITS: The mastoid air-cells and included paranasal sinuses are well-aerated.The included ocular globes and orbital contents are non-suspicious. OTHER: None. IMPRESSION: 5 subcentimeter white matter lesions concerning for chronic demyelination, dominant 9 mm RIGHT parietal lesion. No parenchymal brain volume loss for age nor acute intracranial process. Electronically Signed   By: CElon AlasM.D.   On: 06/14/2016 21:14   Mr Lumbar Spine Wo Contrast  Result Date: 06/13/2016 CLINICAL DATA:  Initial evaluation for acute pain with hyper reflexia. Numbness and tingling and lumbar region. EXAM: MRI LUMBAR SPINE WITHOUT CONTRAST TECHNIQUE: Multiplanar, multisequence MR imaging of the lumbar spine was performed. No intravenous contrast was administered. COMPARISON:  Prior radiograph from 06/10/2016. FINDINGS: Segmentation: Normal segmentation. Lowest well-formed disc is labeled the L5-S1 level. Alignment: Vertebral bodies normally aligned with preservation of the normal lumbar lordosis. No listhesis. Vertebrae: Vertebral body heights are well maintained. No evidence for acute or chronic fracture. Signal intensity within the vertebral body bone marrow  within normal limits. No discrete or worrisome osseous lesions. Conus medullaris: Extends to the L1-2 level and appears normal. Paraspinal and other soft tissues: Paraspinous soft tissues within normal limits. Visualized fissure structures are unremarkable. Disc levels: L1-2: Normal interspace. Mild facet and ligamentum flavum hypertrophy. No canal or neural foraminal stenosis. L2-3: Normal interspace. Mild facet and ligamentum flavum hypertrophy. No canal or neural foraminal stenosis. L3-4: Normal interspace. Moderate facet arthrosis with ligamentum flavum hypertrophy. No significant canal or neural foraminal stenosis. L4-5: Normal interspace. Moderate facet arthrosis with ligamentum flavum hypertrophy. Mild reactive edema about the bilateral L4-5 facets. No significant canal or neural foraminal stenosis. L5-S1: Normal interspace. Mild bilateral facet arthrosis. No significant canal or neural foraminal stenosis. IMPRESSION: 1. Multilevel mild to moderate facet arthrosis involving the lumbar spine, most severe at the L4-5 level bilaterally. Finding could serve as a source for lower back pain. 2. Otherwise unremarkable MRI of the lumbar spine. No significant degenerative disc disease.  No stenosis or evidence for neural impingement. Electronically Signed   By: Jeannine Boga M.D.   On: 06/13/2016 12:51   Mr Lumbar Spine W Contrast  Result Date: 06/14/2016 CLINICAL DATA:  Bilateral hand numbness, leg heaviness and urinary retention for 4 days. EXAM: MRI CERVICAL SPINE WITHOUT AND WITH CONTRAST MRI THORACIC SPINE WITHOUT AND WITH CONTRAST MRI LUMBAR SPINE WITH CONTRAST TECHNIQUE: Multisequence MR imaging of the spine from the cervical spine to the thoracolumbar junction was performed prior to and following IV contrast administration for evaluation of demyelinating disease. Multisequence MRI imaging of the lumbar spine obtained without intravenous contrast. CONTRAST:  30m MULTIHANCE GADOBENATE DIMEGLUMINE 529  MG/ML IV SOLN COMPARISON:  MRI of the lumbar spine without contrast Jun 13, 2016 FINDINGS: MRI CERVICAL SPINE FINDINGS ALIGNMENT: Straightened cervical lordosis.  No malalignment. VERTEBRAE/DISCS: Vertebral bodies are intact. Intervertebral disc morphology's and signal are normal. No abnormal bone marrow signal or acute osseous process. Mild chronic discogenic endplate changes CN4-0and to lesser extent C6-7. No abnormal osseous or disc enhancement. CORD:Expansile 11 mm T2 bright lesion dorsal C3 with faint enhancement. 5 mm nonenhancing lesion within dorsal spinal cord at C5. No myelomalacia or syrinx. No abnormal leptomeningeal or epidural enhancement. POSTERIOR FOSSA, VERTEBRAL ARTERIES, PARASPINAL TISSUES: No MR findings of ligamentous injury. Vertebral artery flow voids present. Included posterior fossa and paraspinal soft tissues are normal. DISC LEVELS: C2-3: No disc bulge, canal stenosis nor neural foraminal narrowing. C3-4, C4-5: Tiny central disc protrusion mild facet arthropathy. No canal stenosis or neural foraminal narrowing. C5-6: Annular bulging without canal stenosis or neural foraminal narrowing. C6-7: Tiny central disc protrusion without canal stenosis or neural foraminal narrowing. C7-T1: No disc bulge, canal stenosis nor neural foraminal narrowing. MRI THORACIC SPINE FINDINGS- mildly motion degraded examination. ALIGNMENT: Maintenance of the thoracic kyphosis. No malalignment.No abnormal or acute bone marrow signal. No abnormal osseous or disc enhancement. VERTEBRAE/DISCS: Vertebral bodies are intact. Intervertebral discs morphology and signal are normal. CORD: Thoracic spinal cord is normal morphology and signal characteristics to the level of the conus medullaris. No abnormal cord, leptomeningeal or epidural enhancement. Please note, motion degrades sensitivity for subtle potential cord signal abnormality. PREVERTEBRAL AND PARASPINAL SOFT TISSUES: Multiple nonenhancing T2 bright masses in the  liver most compatible with cysts as demonstrated on CT abdomen and pelvis July 24, 2008. DISC LEVELS: T6-7: Small central disc protrusion without canal stenosis or neural foraminal narrowing. No disc bulge, canal stenosis nor neural foraminal narrowing at the remaining thoracic levels. MRI LUMBAR SPINE FINDINGS VERTEBRAE:No abnormal osseous or disc enhancement. CONUS MEDULLARIS: No abnormal cord, leptomeningeal or epidural enhancement. IMPRESSION: MRI CERVICAL SPINE: Acute 11 mm C3 demyelinating lesion. Chronic 5 mm C5 demyelinating lesion. Early degenerative change of the cervical spine without canal stenosis or neural foraminal narrowing. MRI THORACIC SPINE: No MR findings of demyelination in thoracic spinal cord. Small T6-7 disc protrusion without canal stenosis or neural foraminal narrowing at any thoracic level. MRI LUMBAR SPINE: No abnormal enhancement. Electronically Signed   By: CElon AlasM.D.   On: 06/14/2016 21:36   Mr Cervical Spine W Or Wo Contrast  Result Date: 06/14/2016 CLINICAL DATA:  Bilateral hand numbness, leg heaviness and urinary retention for 4 days. EXAM: MRI CERVICAL SPINE WITHOUT AND WITH CONTRAST MRI THORACIC SPINE WITHOUT AND WITH CONTRAST MRI LUMBAR SPINE WITH CONTRAST TECHNIQUE: Multisequence MR imaging of the spine from the cervical spine to the thoracolumbar junction was performed prior to and following IV contrast administration for evaluation of demyelinating disease. Multisequence  MRI imaging of the lumbar spine obtained without intravenous contrast. CONTRAST:  34m MULTIHANCE GADOBENATE DIMEGLUMINE 529 MG/ML IV SOLN COMPARISON:  MRI of the lumbar spine without contrast Jun 13, 2016 FINDINGS: MRI CERVICAL SPINE FINDINGS ALIGNMENT: Straightened cervical lordosis.  No malalignment. VERTEBRAE/DISCS: Vertebral bodies are intact. Intervertebral disc morphology's and signal are normal. No abnormal bone marrow signal or acute osseous process. Mild chronic discogenic endplate  changes CB7-6and to lesser extent C6-7. No abnormal osseous or disc enhancement. CORD:Expansile 11 mm T2 bright lesion dorsal C3 with faint enhancement. 5 mm nonenhancing lesion within dorsal spinal cord at C5. No myelomalacia or syrinx. No abnormal leptomeningeal or epidural enhancement. POSTERIOR FOSSA, VERTEBRAL ARTERIES, PARASPINAL TISSUES: No MR findings of ligamentous injury. Vertebral artery flow voids present. Included posterior fossa and paraspinal soft tissues are normal. DISC LEVELS: C2-3: No disc bulge, canal stenosis nor neural foraminal narrowing. C3-4, C4-5: Tiny central disc protrusion mild facet arthropathy. No canal stenosis or neural foraminal narrowing. C5-6: Annular bulging without canal stenosis or neural foraminal narrowing. C6-7: Tiny central disc protrusion without canal stenosis or neural foraminal narrowing. C7-T1: No disc bulge, canal stenosis nor neural foraminal narrowing. MRI THORACIC SPINE FINDINGS- mildly motion degraded examination. ALIGNMENT: Maintenance of the thoracic kyphosis. No malalignment.No abnormal or acute bone marrow signal. No abnormal osseous or disc enhancement. VERTEBRAE/DISCS: Vertebral bodies are intact. Intervertebral discs morphology and signal are normal. CORD: Thoracic spinal cord is normal morphology and signal characteristics to the level of the conus medullaris. No abnormal cord, leptomeningeal or epidural enhancement. Please note, motion degrades sensitivity for subtle potential cord signal abnormality. PREVERTEBRAL AND PARASPINAL SOFT TISSUES: Multiple nonenhancing T2 bright masses in the liver most compatible with cysts as demonstrated on CT abdomen and pelvis July 24, 2008. DISC LEVELS: T6-7: Small central disc protrusion without canal stenosis or neural foraminal narrowing. No disc bulge, canal stenosis nor neural foraminal narrowing at the remaining thoracic levels. MRI LUMBAR SPINE FINDINGS VERTEBRAE:No abnormal osseous or disc enhancement. CONUS  MEDULLARIS: No abnormal cord, leptomeningeal or epidural enhancement. IMPRESSION: MRI CERVICAL SPINE: Acute 11 mm C3 demyelinating lesion. Chronic 5 mm C5 demyelinating lesion. Early degenerative change of the cervical spine without canal stenosis or neural foraminal narrowing. MRI THORACIC SPINE: No MR findings of demyelination in thoracic spinal cord. Small T6-7 disc protrusion without canal stenosis or neural foraminal narrowing at any thoracic level. MRI LUMBAR SPINE: No abnormal enhancement. Electronically Signed   By: CElon AlasM.D.   On: 06/14/2016 21:36   Mr Thoracic Spine W Wo Contrast  Result Date: 06/14/2016 CLINICAL DATA:  Bilateral hand numbness, leg heaviness and urinary retention for 4 days. EXAM: MRI CERVICAL SPINE WITHOUT AND WITH CONTRAST MRI THORACIC SPINE WITHOUT AND WITH CONTRAST MRI LUMBAR SPINE WITH CONTRAST TECHNIQUE: Multisequence MR imaging of the spine from the cervical spine to the thoracolumbar junction was performed prior to and following IV contrast administration for evaluation of demyelinating disease. Multisequence MRI imaging of the lumbar spine obtained without intravenous contrast. CONTRAST:  28mMULTIHANCE GADOBENATE DIMEGLUMINE 529 MG/ML IV SOLN COMPARISON:  MRI of the lumbar spine without contrast Jun 13, 2016 FINDINGS: MRI CERVICAL SPINE FINDINGS ALIGNMENT: Straightened cervical lordosis.  No malalignment. VERTEBRAE/DISCS: Vertebral bodies are intact. Intervertebral disc morphology's and signal are normal. No abnormal bone marrow signal or acute osseous process. Mild chronic discogenic endplate changes [id number] to lesser extent C6-7. No abnormal osseous or disc enhancement. CORD:Expansile 11 mm T2 bright lesion dorsal C3 with faint enhancement. 5 mm nonenhancing lesion  within dorsal spinal cord at C5. No myelomalacia or syrinx. No abnormal leptomeningeal or epidural enhancement. POSTERIOR FOSSA, VERTEBRAL ARTERIES, PARASPINAL TISSUES: No MR findings of  ligamentous injury. Vertebral artery flow voids present. Included posterior fossa and paraspinal soft tissues are normal. DISC LEVELS: C2-3: No disc bulge, canal stenosis nor neural foraminal narrowing. C3-4, C4-5: Tiny central disc protrusion mild facet arthropathy. No canal stenosis or neural foraminal narrowing. C5-6: Annular bulging without canal stenosis or neural foraminal narrowing. C6-7: Tiny central disc protrusion without canal stenosis or neural foraminal narrowing. C7-T1: No disc bulge, canal stenosis nor neural foraminal narrowing. MRI THORACIC SPINE FINDINGS- mildly motion degraded examination. ALIGNMENT: Maintenance of the thoracic kyphosis. No malalignment.No abnormal or acute bone marrow signal. No abnormal osseous or disc enhancement. VERTEBRAE/DISCS: Vertebral bodies are intact. Intervertebral discs morphology and signal are normal. CORD: Thoracic spinal cord is normal morphology and signal characteristics to the level of the conus medullaris. No abnormal cord, leptomeningeal or epidural enhancement. Please note, motion degrades sensitivity for subtle potential cord signal abnormality. PREVERTEBRAL AND PARASPINAL SOFT TISSUES: Multiple nonenhancing T2 bright masses in the liver most compatible with cysts as demonstrated on CT abdomen and pelvis July 24, 2008. DISC LEVELS: T6-7: Small central disc protrusion without canal stenosis or neural foraminal narrowing. No disc bulge, canal stenosis nor neural foraminal narrowing at the remaining thoracic levels. MRI LUMBAR SPINE FINDINGS VERTEBRAE:No abnormal osseous or disc enhancement. CONUS MEDULLARIS: No abnormal cord, leptomeningeal or epidural enhancement. IMPRESSION: MRI CERVICAL SPINE: Acute 11 mm C3 demyelinating lesion. Chronic 5 mm C5 demyelinating lesion. Early degenerative change of the cervical spine without canal stenosis or neural foraminal narrowing. MRI THORACIC SPINE: No MR findings of demyelination in thoracic spinal cord. Small T6-7  disc protrusion without canal stenosis or neural foraminal narrowing at any thoracic level. MRI LUMBAR SPINE: No abnormal enhancement. Electronically Signed   By: Elon Alas M.D.   On: 06/14/2016 21:36    Assessment/Plan 56 year old lady with probable chronic demyelinating disease ([place]) with no previously documented clinical manifestations, now presenting with an acute exacerbation with acute cervical myelitis, with an acute demyelinating lesion demonstrated on cervical MRI.  Recommendations: 1. High-dose steroids with Solu-Medrol 500 mg IV every 12 hours for total of 6 doses. 2. Physical therapy consultation 3. Occupational therapy consultation 4. Protonix 40 mg per day, by mouth or IV  We will continue to follow this patient with you.  C.R. Nicole Kindred, [place] [facility] [phone]  06/14/2016, 11:21 PM

## 2016-06-14 NOTE — Addendum Note (Signed)
Addended by: Deirdre Evener on: 06/14/2016 09:58 AM   Modules accepted: Orders

## 2016-06-14 NOTE — ED Triage Notes (Signed)
Pt to ER by internal medicine for MRI of head/neck per patient. States she has been having bilateral hand numbness, leg heaviness, and urinary retention since Saturday. Pt is ambulatory. A/o x4.

## 2016-06-14 NOTE — Telephone Encounter (Signed)
   Reason for call:   I received a call from [name] at 7:33 AM indicating she is having back problems.   Pertinent Data:   Seen by Dr. Lovena [name] recently. Having "major problems" with lower back, numbness in her feet and hands. She has tightness in the "inside of her stomach". She has one more dose of prednisone and she has been taking duloxetine.  She is short of breath as well.  She has been having bowel and bladder incontinence.  MRI yesterday with multilevel mild to moderate facet arthrosis involving the lumbar spine, most severe at the L4-5 level bilaterally.   Assessment / Plan / Recommendations:   I discussed with her that she needs to go to the ED if she is having this severe of symptoms. I cannot reschedule her neurosurgery/ortho appt. She refuses to go to the ED. She would like to be seen in clinic today. Will forward this to Drs. Tamara [name], and triage RN.  As always, pt is advised that if symptoms worsen or new symptoms arise, they should go to an urgent care facility or to ER for further evaluation.   Milagros Loll, MD   06/14/2016, 7:28 AM

## 2016-06-14 NOTE — ED Notes (Signed)
Patient transported to MRI 

## 2016-06-14 NOTE — ED Notes (Signed)
Pt has 335 mL in her bladder currently. Pt needs to use restroom, will measure her output.

## 2016-06-14 NOTE — ED Notes (Signed)
MD still at bedside.

## 2016-06-14 NOTE — Telephone Encounter (Signed)
I called the patient to discuss options to continue to workup her paresthesias this morning. However, when I spoke with her on the phone she said this is the worse her symptoms have ever been. She endorses severe midline low back pain with paresthesias in her lower and upper extremities. Although this was similar to her prior complaints she told me this morning on the phone that she is now incontinent of urine. She also has paresthesias in the perineal area. Given that she has now become incontinent of urine with progressive pain I advised the patient to go to the emergency department. As documented. Recent lumbar MRI was normal. I do have concern that the patient may have multiple sclerosis or a lesion more superiorly in the cord. She may also have some type of infection which would require workup. On my physical exam in clinic 2 days ago she had hyperreflexia of the right patellar tendon reflex. However, the remainder of her neurological examination was unremarkable at that time. She has had a relatively extensive workup several years ago which was negative. We were attempting to do this work up in the clinic and on speaking with the patient this morning and learning of her new urinary incontinence I have advised her to go to the emergency department. She was amenable to this plan.

## 2016-06-15 DIAGNOSIS — R269 Unspecified abnormalities of gait and mobility: Secondary | ICD-10-CM | POA: Diagnosis not present

## 2016-06-15 DIAGNOSIS — N319 Neuromuscular dysfunction of bladder, unspecified: Secondary | ICD-10-CM | POA: Diagnosis not present

## 2016-06-15 DIAGNOSIS — G894 Chronic pain syndrome: Secondary | ICD-10-CM | POA: Diagnosis present

## 2016-06-15 DIAGNOSIS — G0489 Other myelitis: Secondary | ICD-10-CM | POA: Diagnosis not present

## 2016-06-15 DIAGNOSIS — K589 Irritable bowel syndrome without diarrhea: Secondary | ICD-10-CM | POA: Diagnosis not present

## 2016-06-15 DIAGNOSIS — Z91048 Other nonmedicinal substance allergy status: Secondary | ICD-10-CM | POA: Diagnosis not present

## 2016-06-15 DIAGNOSIS — D72829 Elevated white blood cell count, unspecified: Secondary | ICD-10-CM | POA: Diagnosis not present

## 2016-06-15 DIAGNOSIS — Z888 Allergy status to other drugs, medicaments and biological substances status: Secondary | ICD-10-CM | POA: Diagnosis not present

## 2016-06-15 DIAGNOSIS — G373 Acute transverse myelitis in demyelinating disease of central nervous system: Secondary | ICD-10-CM | POA: Diagnosis not present

## 2016-06-15 DIAGNOSIS — D638 Anemia in other chronic diseases classified elsewhere: Secondary | ICD-10-CM | POA: Diagnosis not present

## 2016-06-15 DIAGNOSIS — M549 Dorsalgia, unspecified: Secondary | ICD-10-CM | POA: Diagnosis present

## 2016-06-15 DIAGNOSIS — R32 Unspecified urinary incontinence: Secondary | ICD-10-CM | POA: Diagnosis present

## 2016-06-15 DIAGNOSIS — G4700 Insomnia, unspecified: Secondary | ICD-10-CM | POA: Diagnosis not present

## 2016-06-15 DIAGNOSIS — R29818 Other symptoms and signs involving the nervous system: Secondary | ICD-10-CM | POA: Diagnosis not present

## 2016-06-15 DIAGNOSIS — G35 Multiple sclerosis: Secondary | ICD-10-CM | POA: Diagnosis not present

## 2016-06-15 DIAGNOSIS — Z6839 Body mass index (BMI) 39.0-39.9, adult: Secondary | ICD-10-CM | POA: Diagnosis not present

## 2016-06-15 DIAGNOSIS — E669 Obesity, unspecified: Secondary | ICD-10-CM | POA: Diagnosis present

## 2016-06-15 DIAGNOSIS — K219 Gastro-esophageal reflux disease without esophagitis: Secondary | ICD-10-CM | POA: Diagnosis not present

## 2016-06-15 DIAGNOSIS — G379 Demyelinating disease of central nervous system, unspecified: Secondary | ICD-10-CM | POA: Diagnosis not present

## 2016-06-15 DIAGNOSIS — R609 Edema, unspecified: Secondary | ICD-10-CM | POA: Diagnosis not present

## 2016-06-15 DIAGNOSIS — T380X5A Adverse effect of glucocorticoids and synthetic analogues, initial encounter: Secondary | ICD-10-CM | POA: Diagnosis not present

## 2016-06-15 DIAGNOSIS — Z881 Allergy status to other antibiotic agents status: Secondary | ICD-10-CM | POA: Diagnosis not present

## 2016-06-15 DIAGNOSIS — Z8249 Family history of ischemic heart disease and other diseases of the circulatory system: Secondary | ICD-10-CM | POA: Diagnosis not present

## 2016-06-15 DIAGNOSIS — M5430 Sciatica, unspecified side: Secondary | ICD-10-CM | POA: Diagnosis present

## 2016-06-15 DIAGNOSIS — Z885 Allergy status to narcotic agent status: Secondary | ICD-10-CM | POA: Diagnosis not present

## 2016-06-15 DIAGNOSIS — G054 Myelitis in diseases classified elsewhere: Secondary | ICD-10-CM | POA: Diagnosis present

## 2016-06-15 DIAGNOSIS — R208 Other disturbances of skin sensation: Secondary | ICD-10-CM | POA: Diagnosis not present

## 2016-06-15 DIAGNOSIS — R739 Hyperglycemia, unspecified: Secondary | ICD-10-CM | POA: Diagnosis not present

## 2016-06-15 DIAGNOSIS — R202 Paresthesia of skin: Secondary | ICD-10-CM | POA: Diagnosis not present

## 2016-06-15 DIAGNOSIS — M792 Neuralgia and neuritis, unspecified: Secondary | ICD-10-CM | POA: Diagnosis not present

## 2016-06-15 DIAGNOSIS — G600 Hereditary motor and sensory neuropathy: Secondary | ICD-10-CM | POA: Diagnosis not present

## 2016-06-15 DIAGNOSIS — R200 Anesthesia of skin: Secondary | ICD-10-CM | POA: Diagnosis not present

## 2016-06-15 DIAGNOSIS — K592 Neurogenic bowel, not elsewhere classified: Secondary | ICD-10-CM | POA: Diagnosis not present

## 2016-06-15 DIAGNOSIS — Z79899 Other long term (current) drug therapy: Secondary | ICD-10-CM | POA: Diagnosis not present

## 2016-06-15 LAB — CSF CELL COUNT WITH DIFFERENTIAL
RBC COUNT CSF: 12 /mm3 — AB
RBC Count, CSF: 0 /mm3
TUBE #: 1
Tube #: 4
WBC, CSF: 0 /mm3 (ref 0–5)
WBC, CSF: 2 /mm3 (ref 0–5)

## 2016-06-15 LAB — GLUCOSE, CAPILLARY
GLUCOSE-CAPILLARY: 119 mg/dL — AB (ref 65–99)
GLUCOSE-CAPILLARY: 119 mg/dL — AB (ref 65–99)
GLUCOSE-CAPILLARY: 119 mg/dL — AB (ref 65–99)
GLUCOSE-CAPILLARY: 141 mg/dL — AB (ref 65–99)

## 2016-06-15 LAB — PROTEIN AND GLUCOSE, CSF
Glucose, CSF: 60 mg/dL (ref 40–70)
Total  Protein, CSF: 42 mg/dL (ref 15–45)

## 2016-06-15 LAB — VITAMIN B12: Vitamin B-12: 2209 pg/mL — ABNORMAL HIGH (ref 180–914)

## 2016-06-15 LAB — TSH: TSH: 0.351 u[IU]/mL (ref 0.350–4.500)

## 2016-06-15 LAB — HIV ANTIBODY (ROUTINE TESTING W REFLEX): HIV SCREEN 4TH GENERATION: NONREACTIVE

## 2016-06-15 LAB — CK: Total CK: 59 U/L (ref 38–234)

## 2016-06-15 MED ORDER — ENOXAPARIN SODIUM 40 MG/0.4ML ~~LOC~~ SOLN
40.0000 mg | SUBCUTANEOUS | Status: DC
  Administered 2016-06-15 – 2016-06-18 (×4): 40 mg via SUBCUTANEOUS
  Filled 2016-06-15 (×3): qty 0.4

## 2016-06-15 MED ORDER — ACETAMINOPHEN 325 MG PO TABS
650.0000 mg | ORAL_TABLET | Freq: Four times a day (QID) | ORAL | Status: DC | PRN
  Administered 2016-06-15: 650 mg via ORAL
  Filled 2016-06-15: qty 2

## 2016-06-15 MED ORDER — TRAMADOL HCL 50 MG PO TABS
50.0000 mg | ORAL_TABLET | Freq: Four times a day (QID) | ORAL | Status: DC | PRN
  Administered 2016-06-15 – 2016-06-19 (×9): 50 mg via ORAL
  Administered 2016-06-19: 100 mg via ORAL
  Filled 2016-06-15 (×2): qty 1
  Filled 2016-06-15: qty 2
  Filled 2016-06-15 (×7): qty 1

## 2016-06-15 NOTE — Procedures (Signed)
Indication: MS versus transverse myelitis  Risks of the procedure were dicussed with the patient including post-LP headache, bleeding, infection, weakness/numbness of legs(radiculopathy), death.  The patient agreed and written consent was obtained.   The patient was prepped and draped, and using sterile technique a 20 gauge quinke spinal needle was inserted in the L 4/5 space. The opening pressure was not obtained. Approximately 13 cc of CSF were obtained and sent for analysis.   No complications were encountered.  Etta Quill PA-C [facility] [phone]  M-F  (8:30 am- 4 PM)  06/15/2016, 11:58 AM

## 2016-06-15 NOTE — Care Management Note (Signed)
Case Management Note  Patient Details  Name: Tamara [name] MRN: [id number] Date of Birth: 02/24/1960  Subjective/Objective:    Pt admitted with MS.  PMH chronic sciatica, chronic back and neck pain, GERD, and IBS. She is from home with spouse.                Action/Plan: Receiving IV Solumedrol. Awaiting PT/OT recommendations. CM following for d/c needs, physician orders.   Expected Discharge Date:                  Expected Discharge Plan:     In-House Referral:     Discharge planning Services     Post Acute Care Choice:    Choice offered to:     DME Arranged:    DME Agency:     HH Arranged:    HH Agency:     Status of Service:  In process, will continue to follow  If discussed at Long Length of Stay Meetings, dates discussed:    Additional Comments:  Pollie Friar, RN 06/15/2016, 12:26 PM

## 2016-06-15 NOTE — Evaluation (Signed)
Physical Therapy Evaluation Patient Details Name: Tamara [name] MRN: [id number] DOB: Feb 23, 1960 Today's Date: 06/15/2016   History of Present Illness  Pt is an 56 y.o. female with a history of sciatica, irritable bowel syndrome, GERD and anemia, presenting with progressive numbness involving her trunk from the chest downward including lower extremities as well as numbness involving both hands. Onset of symptoms was on 06/09/2016. Loss of sensation includes sacral numbness. She's had no incontinence of urine or stool. MRI of the brain showed multiple subcentimeter T2 abnormalities indicative of probable demyelinating disease. MRI of cervical spine showed an acute 11 mm demyelinating lesion at the 3, and a smaller chronic lesion at C5.  Clinical Impression  Pt presented supine in bed with HOB elevated, awake and willing to participate in therapy session. Pt's spouse was present throughout most of evaluation. Prior to admission, pt reported that she was very independent with all functional mobility, ADLs, continues to drive, takes care of her husband and baby sits her granddaughter every day. Pt was very pleasant and motivated throughout. She currently requires min guard for bed mobility, mod A for transfers and mod A for dynamic and static standing balance. Deferred ambulation at this time as pt was unable to safely advance either foot forwards per Egress Test. Pt is an excellent candidate for CIR as she is very motivated, would easily tolerate a one hour therapy session multiple times a day and has potential to make great progress with intensive therapy services. Pt would continue to benefit from skilled physical therapy services at this time while admitted and after d/c to address the below listed limitations in order to improve overall safety and independence with functional mobility.     Follow Up Recommendations CIR;Supervision/Assistance - 24 hour    Equipment Recommendations  None recommended by  PT;Other (comment) (defer to next venue)    Recommendations for Other Services Rehab consult     Precautions / Restrictions Precautions Precautions: Fall Restrictions Weight Bearing Restrictions: No      Mobility  Bed Mobility Overal bed mobility: Needs Assistance Bed Mobility: Supine to Sit;Sit to Supine     Supine to sit: Min guard Sit to supine: Min guard   General bed mobility comments: increased time, vc'ing for sequencing and close min guard for safety  Transfers Overall transfer level: Needs assistance Equipment used: Rolling walker (2 wheeled) Transfers: Sit to/from Stand Sit to Stand: Min assist;Mod assist         General transfer comment: increased time, vc'ing for bilateral hand placement, min-mod A as pt very unstable with rise into standing from bed.   Ambulation/Gait             General Gait Details: deferred at this time as pt unable to safely advance either foot forwards with RW per Egress Test. pt able to take 3 side steps laterally in both directions; however, requiring mod A with significant instability and LOB posteriorly with knees buckling  Stairs            Wheelchair Mobility    Modified Rankin (Stroke Patients Only)       Balance Overall balance assessment: Needs assistance Sitting-balance support: Feet supported Sitting balance-Leahy Scale: Fair     Standing balance support: During functional activity;Bilateral upper extremity supported Standing balance-Leahy Scale: Poor Standing balance comment: pt reliant on bilateral UEs on RW and mod A  Pertinent Vitals/Pain Pain Assessment: 0-10 Pain Score: 10-Worst pain ever Pain Location: bilateral feet Pain Descriptors / Indicators: Constant Pain Intervention(s): Monitored during session;Repositioned    Home Living Family/patient expects to be discharged to:: Private residence Living Arrangements: Spouse/significant other Available  Help at Discharge: Family;Available 24 hours/day Type of Home: House Home Access: Stairs to enter Entrance Stairs-Rails: None Entrance Stairs-Number of Steps: 4 Home Layout: Two level;Able to live on main level with bedroom/bathroom Home Equipment: Hand held shower head;Grab bars - tub/shower Additional Comments: husband is disabled (heart disease)     Prior Function Level of Independence: Independent         Comments: pt states that she cares for her husband and babysits her granddaughter every day     Hand Dominance        Extremity/Trunk Assessment   Upper Extremity Assessment Upper Extremity Assessment: Defer to OT evaluation    Lower Extremity Assessment Lower Extremity Assessment: Generalized weakness;RLE deficits/detail;LLE deficits/detail RLE Sensation: decreased light touch RLE Coordination: decreased fine motor;decreased gross motor LLE Sensation: decreased light touch LLE Coordination: decreased fine motor;decreased gross motor    Cervical / Trunk Assessment Cervical / Trunk Assessment: Normal  Communication   Communication: No difficulties  Cognition Arousal/Alertness: Awake/alert Behavior During Therapy: WFL for tasks assessed/performed Overall Cognitive Status: Within Functional Limits for tasks assessed                                        General Comments      Exercises     Assessment/Plan    PT Assessment Patient needs continued PT services  PT Problem List Decreased strength;Decreased activity tolerance;Decreased balance;Decreased mobility;Decreased coordination;Decreased knowledge of use of DME;Decreased safety awareness;Impaired sensation;Pain       PT Treatment Interventions DME instruction;Gait training;Stair training;Functional mobility training;Therapeutic activities;Therapeutic exercise;Neuromuscular re-education;Balance training;Patient/family education    PT Goals (Current goals can be found in the Care Plan  section)  Acute Rehab PT Goals Patient Stated Goal: to get stronger and back to independence PT Goal Formulation: With patient Time For Goal Achievement: 06/29/16 Potential to Achieve Goals: Good    Frequency Min 3X/week   Barriers to discharge Decreased caregiver support      Co-evaluation               AM-PAC PT "6 Clicks" Daily Activity  Outcome Measure Difficulty turning over in bed (including adjusting bedclothes, sheets and blankets)?: A Little Difficulty moving from lying on back to sitting on the side of the bed? : A Little Difficulty sitting down on and standing up from a chair with arms (e.g., wheelchair, bedside commode, etc,.)?: Total Help needed moving to and from a bed to chair (including a wheelchair)?: A Lot Help needed walking in hospital room?: A Lot Help needed climbing 3-5 steps with a railing? : Total 6 Click Score: 12    End of Session Equipment Utilized During Treatment: Gait belt Activity Tolerance: Patient tolerated treatment well Patient left: in bed;with call bell/phone within reach Nurse Communication: Mobility status PT Visit Diagnosis: Unsteadiness on feet (R26.81);Other abnormalities of gait and mobility (R26.89);Other symptoms and signs involving the nervous system (R29.898)    Time: 1610-9604 PT Time Calculation (min) (ACUTE ONLY): 39 min   Charges:   PT Evaluation $PT Eval Moderate Complexity: 1 Procedure PT Treatments $Therapeutic Activity: 23-37 mins   PT G Codes:   PT G-Codes **NOT FOR INPATIENT CLASS**  Functional Assessment Tool Used: AM-PAC 6 Clicks Basic Mobility;Clinical judgement Functional Limitation: Mobility: Walking and moving around Mobility: Walking and Moving Around Current Status [phone]): At least 60 percent but less than 80 percent impaired, limited or restricted Mobility: Walking and Moving Around Goal Status [phone]): At least 20 percent but less than 40 percent impaired, limited or restricted    [facility], [place], DPT [place] 06/15/2016, 4:17 PM

## 2016-06-15 NOTE — Progress Notes (Signed)
   Subjective: She feels poorly this morning. No improvement since yesterday. Still complains of paresthesias. Pain in bilateral feet. Some muscle pain.  Objective:  Vital signs in last 24 hours: Vitals:   06/14/16 1425 06/14/16 2058 06/15/16 0036 06/15/16 0503  BP:  129/65 138/71 127/71  Pulse:  60 (!) 55 (!) 54  Resp:  12 14 16   Temp:   98.1 F (36.7 C) 97.9 F (36.6 C)  TempSrc:   Oral Oral  SpO2:  98% 100% 100%  Weight: 220 lb (99.8 kg)  225 lb 1.6 oz (102.1 kg)   Height: 5\' 3"  (1.6 m)  5\' 3"  (1.6 m)    General Apperance: NAD HEENT: Normocephalic, atraumatic, anicteric sclera Neck: Supple, trachea midline Lungs: Clear to auscultation bilaterally. No wheezes, rhonchi or rales. Breathing comfortably Heart: Regular rate and rhythm, no murmur/rub/gallop Abdomen: Soft, nontender, nondistended, no rebound/guarding Extremities: Warm and well perfused, no edema Skin: No rashes or lesions Neurologic: Alert and interactive. Hypersensitivity of pain on light touch BLE R>L. Mild decrease strength LLE. Decreased grip strength and forearm flexor strength bilaterally. Decreased sensation of hands and from her mid abdomen (level T12) down to her toes, 3+ DTRs throughout, Babinski absent. CNs intact. No visual disturbance.  Assessment/Plan:  Cervical myelitis w/ concern for Multiple sclerosis: Numerous new white matter lesions throughout the cortex and cervical spine. Patchy deficits would fit with MS picture, but have concern for GB d/t ascending nature of symptoms and recent GI illness 2 wks prior. Also has exquisite tenderness to light touch in BLE, and muscle pain. CK wnl. Will discuss possibility of CSF studies with Neurology today. -- Neurology following, appreciate recommendations -- High dose steroids / Solu-Medrol 500 mg IV BID for 3 days -- Protonix 40 mg QD for ulcer ppx -- follow CBGs, SSI PRN -- PT/OT eval and treat -- Tylenol prn -- Added tramadol PRN severe pain -- Continue  cymbalta  FEN/GI: Regular diet, replete electrolytes as needed VTE ppx: Lovenox Code status: Full code  Dispo: Anticipated discharge in approximately 3 day(s).   Holley Raring, MD 06/15/2016, 9:39 AM Pager: [phone]
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
## 2016-06-15 NOTE — Telephone Encounter (Signed)
Pt admitted

## 2016-06-15 NOTE — Evaluation (Signed)
Occupational Therapy Evaluation Patient Details Name: Tamara [name] MRN: [id number] DOB: 1960-10-12 Today's Date: 06/15/2016    History of Present Illness Pt is an 56 y.o. female with a history of sciatica, irritable bowel syndrome, GERD and anemia, presenting with progressive numbness involving her trunk from the chest downward including lower extremities as well as numbness involving both hands. Onset of symptoms was on 06/09/2016. Loss of sensation includes sacral numbness. She's had no incontinence of urine or stool. MRI of the brain showed multiple subcentimeter T2 abnormalities indicative of probable demyelinating disease. MRI of cervical spine showed an acute 11 mm demyelinating lesion at the 3, and a smaller chronic lesion at C5.   Clinical Impression   PTA Pt was independent in ADL/IADL and mobility. Pt VERY independent and motivated to re-gain independence, very pleasant and willing/excited to work with therapy. Pt is currently at a min A to min A level for ADL with adapted equipment (built up handles, or other AE) and mod +2 HHA for safety for mobility. Please see OT problem list. Pt will benefit from skilled OT in the acute setting, and will require CIR level therapy to maximize safety and independence in ADL and functional transfers. Pt can and will tolerate hour long sessions of therapy and is very eager for education in safety and compensatory strategies for energy conservation, DME/AE education.    Follow Up Recommendations  CIR;Supervision/Assistance - 24 hour    Equipment Recommendations  Other (comment) (defer to next venue)    Recommendations for Other Services Rehab consult     Precautions / Restrictions Precautions Precautions: Fall Restrictions Weight Bearing Restrictions: No      Mobility Bed Mobility Overal bed mobility: Needs Assistance Bed Mobility: Supine to Sit;Sit to Supine     Supine to sit: Min guard Sit to supine: Min guard   General bed mobility  comments: increased time, vc'ing for sequencing and close min guard for safety  Transfers Overall transfer level: Needs assistance Equipment used: 2 person hand held assist Transfers: Sit to/from [facility] Sit to Stand: Mod assist;+2 safety/equipment Stand pivot transfers: Mod assist;+2 safety/equipment       General transfer comment: increased time, vc'ing for bilateral hand placement, min-mod A as pt very unstable with rise into standing from bed and BSC    Balance Overall balance assessment: Needs assistance Sitting-balance support: Feet supported Sitting balance-Leahy Scale: Fair Sitting balance - Comments: Pt with right lateral lean with eyes closed, unable to hold midline/upright   Standing balance support: During functional activity;Bilateral upper extremity supported Standing balance-Leahy Scale: Poor Standing balance comment: pt reliant on support from [facility] and room surfaces (bed, arm rests on BSC)                           ADL either performed or assessed with clinical judgement   ADL Overall ADL's : Needs assistance/impaired Eating/Feeding: Set up;With adaptive utensils;Cueing for compensatory techinques;Sitting   Grooming: Wash/dry hands;Wash/dry face;Oral care;Moderate assistance;With adaptive equipment;Sitting Grooming Details (indicate cue type and reason): used wash cloth to build up handles of toothbrush for oral care. Pt require BUE to perform. Pt required assist to maintain control of washcloth during face washing. Upper Body Bathing: Sitting;Minimal assistance Upper Body Bathing Details (indicate cue type and reason): Pt unable to maintain grip on washcloth during UB bathing, she required assist for her back Lower Body Bathing: Moderate assistance;Sitting/lateral leans;With adaptive equipment Lower Body Bathing Details (indicate cue type and reason):  BLE very sensative to touch, Pt's face visually grimacing during cleaning. Pt able to  get thighs, but required assist for the knees down Upper Body Dressing : Set up;Sitting   Lower Body Dressing: Sit to/from stand;Moderate assistance;Maximal assistance Lower Body Dressing Details (indicate cue type and reason): max a to don socks sitting in chair Toilet Transfer: +2 for safety/equipment;Moderate assistance;BSC;Stand-pivot Toilet Transfer Details (indicate cue type and reason): unable to grip RW to assist with transfer, right leg very unstable during transfer [place] and Hygiene: Minimal assistance;Sitting/lateral lean       Functional mobility during ADLs: Moderate assistance;+2 for safety/equipment General ADL Comments: Pt extremely motivated and this is a drastic change in function for Pt     Vision Baseline Vision/History: Wears glasses Wears Glasses: At all times Patient Visual Report: No change from baseline       Perception     Praxis      Pertinent Vitals/Pain Pain Assessment: 0-10 Pain Score: 10-Worst pain ever Pain Location: BLE Pain Descriptors / Indicators: Constant Pain Intervention(s): Monitored during session;Repositioned     Hand Dominance Right   Extremity/Trunk Assessment Upper Extremity Assessment Upper Extremity Assessment: RUE deficits/detail;LUE deficits/detail RUE Deficits / Details: Cannot withstand any resitance in MMT, able to move against gravity. Able to make a full fist, and full digit extension, but strength is 3 Pt requires built up handles to use grooming tools, Pt able to perform finger to thumb with increased time, only one hand at a time. RUE Sensation: decreased light touch (from wrist down) RUE Coordination: decreased fine motor;decreased gross motor LUE Deficits / Details: Cannot withstand any resitance in MMT, able to move against gravity. Able to make a full fist, and full digit extension, but strength overall a 3 Pt requires built up handles to use grooming tools, Pt able to perform finger to  thumb with increased time, only one hand at a time,  LUE Sensation: decreased light touch LUE Coordination: decreased fine motor;decreased gross motor   Lower Extremity Assessment Lower Extremity Assessment: Defer to PT evaluation RLE Sensation: decreased light touch RLE Coordination: decreased fine motor;decreased gross motor LLE Sensation: decreased light touch LLE Coordination: decreased fine motor;decreased gross motor   Cervical / Trunk Assessment Cervical / Trunk Assessment: Normal   Communication Communication Communication: No difficulties   Cognition Arousal/Alertness: Awake/alert Behavior During Therapy: WFL for tasks assessed/performed Overall Cognitive Status: Within Functional Limits for tasks assessed                                     General Comments       Exercises     Shoulder Instructions      Home Living Family/patient expects to be discharged to:: Private residence Living Arrangements: Spouse/significant other Available Help at Discharge: Family;Available 24 hours/day Type of Home: House Home Access: Stairs to enter [employer] of Steps: 4 Entrance Stairs-Rails: None Home Layout: Two level;Able to live on main level with bedroom/bathroom Alternate Level Stairs-Number of Steps: flight Alternate Level Stairs-Rails: Right Bathroom Shower/Tub: Occupational psychologist: Handicapped height Bathroom Accessibility: Yes How Accessible: Accessible via wheelchair Home Equipment: Hand held shower head;Grab bars - tub/shower   Additional Comments: husband is disabled (heart disease), Pt cares for her 51 year old grandaughter      Prior Functioning/Environment Level of Independence: Independent        Comments: pt states that she cares  for her husband and babysits her granddaughter every day. She enjoys painting and decorating cakes        OT Problem List: Decreased strength;Decreased range of motion;Decreased  activity tolerance;Impaired balance (sitting and/or standing);Decreased coordination;Decreased safety awareness;Decreased knowledge of use of DME or AE;Decreased knowledge of precautions;Obesity;Impaired UE functional use;Pain;Impaired sensation      OT Treatment/Interventions: Self-care/ADL training;Neuromuscular education;Energy conservation;DME and/or AE instruction;Therapeutic activities;Patient/family education;Balance training    OT Goals(Current goals can be found in the care plan section) Acute Rehab OT Goals Patient Stated Goal: to get stronger and back to independence OT Goal Formulation: With patient Time For Goal Achievement: 06/29/16 Potential to Achieve Goals: Good ADL Goals Pt Will Perform Eating: with modified independence;with adaptive utensils;sitting Pt Will Perform Grooming: with adaptive equipment;sitting;with modified independence Pt Will Perform Upper Body Bathing: with modified independence;with adaptive equipment;sitting Pt Will Perform Lower Body Bathing: with adaptive equipment;sit to/from stand;with min guard assist Pt Will Transfer to Toilet: with min guard assist;ambulating (with DME; handicapped height toilet) Pt Will Perform Toileting - Clothing Manipulation and hygiene: with min guard assist;sit to/from stand Additional ADL Goal #1: Pt will recall and implement 3 ways to conserve energy during ADL with one or less verbal cues  OT Frequency: Min 3X/week   Barriers to D/C:            Co-evaluation              AM-PAC PT "6 Clicks" Daily Activity     Outcome Measure Help from another person eating meals?: A Little Help from another person taking care of personal grooming?: A Little Help from another person toileting, which includes using toliet, bedpan, or urinal?: A Lot Help from another person bathing (including washing, rinsing, drying)?: A Lot Help from another person to put on and taking off regular upper body clothing?: A Little Help from  another person to put on and taking off regular lower body clothing?: A Lot 6 Click Score: 15   End of Session Nurse Communication: Mobility status;Other (comment) (no bed alarm set)  Activity Tolerance: Patient tolerated treatment well Patient left: in bed;with call bell/phone within reach  OT Visit Diagnosis: Unsteadiness on feet (R26.81);Muscle weakness (generalized) (M62.81);Ataxia, unspecified (R27.0);Other symptoms and signs involving the nervous system (R29.898);Pain Pain - Right/Left: Right Pain - part of body: Leg                Time: 8676-7209 OT Time Calculation (min): 40 min Charges:  OT General Charges $OT Visit: 1 Procedure OT Evaluation $OT Eval Moderate Complexity: 1 Procedure OT Treatments $Self Care/Home Management : 23-37 mins G-Codes: OT G-codes **NOT FOR INPATIENT CLASS** Functional Assessment Tool Used: AM-PAC 6 Clicks Daily Activity Functional Limitation: Self care Self Care Current Status (O7096): At least 40 percent but less than 60 percent impaired, limited or restricted Self Care Goal Status (G8366): At least 20 percent but less than 40 percent impaired, limited or restricted   Tamara [name] OTR/L [place] 06/15/2016, 5:09 PM

## 2016-06-16 DIAGNOSIS — G0489 Other myelitis: Secondary | ICD-10-CM

## 2016-06-16 DIAGNOSIS — K592 Neurogenic bowel, not elsewhere classified: Secondary | ICD-10-CM

## 2016-06-16 DIAGNOSIS — R200 Anesthesia of skin: Secondary | ICD-10-CM

## 2016-06-16 DIAGNOSIS — M792 Neuralgia and neuritis, unspecified: Secondary | ICD-10-CM

## 2016-06-16 DIAGNOSIS — N319 Neuromuscular dysfunction of bladder, unspecified: Secondary | ICD-10-CM

## 2016-06-16 LAB — VARICELLA-ZOSTER BY PCR: VARICELLA-ZOSTER, PCR: NEGATIVE

## 2016-06-16 LAB — GLUCOSE, CAPILLARY
GLUCOSE-CAPILLARY: 135 mg/dL — AB (ref 65–99)
GLUCOSE-CAPILLARY: 103 mg/dL — AB (ref 65–99)
Glucose-Capillary: 138 mg/dL — ABNORMAL HIGH (ref 65–99)
Glucose-Capillary: 130 mg/dL — ABNORMAL HIGH (ref 65–99)

## 2016-06-16 LAB — NEUROMYELITIS OPTICA AUTOAB, IGG

## 2016-06-16 LAB — VDRL, CSF: SYPHILIS VDRL QUANT CSF: NONREACTIVE

## 2016-06-16 LAB — MISC LABCORP TEST (SEND OUT)
LABCORP TEST CODE: 9985
Labcorp test code: 9985

## 2016-06-16 LAB — RPR: RPR: NONREACTIVE

## 2016-06-16 LAB — ANA W/REFLEX IF POSITIVE: ANA: NEGATIVE

## 2016-06-16 MED ORDER — WHITE PETROLATUM GEL
Status: AC
  Administered 2016-06-16: 11:00:00
  Filled 2016-06-16: qty 1

## 2016-06-16 NOTE — Progress Notes (Signed)
Physical Therapy Treatment Patient Details Name: Tamara [name] MRN: [id number] DOB: 1960/08/30 Today's Date: 06/16/2016    History of Present Illness Pt is an 56 y.o. female with a history of sciatica, irritable bowel syndrome, GERD and anemia, presenting with progressive numbness involving her trunk from the chest downward including lower extremities as well as numbness involving both hands. Onset of symptoms was on 06/09/2016. Loss of sensation includes sacral numbness. She's had no incontinence of urine or stool. MRI of the brain showed multiple subcentimeter T2 abnormalities indicative of probable demyelinating disease. MRI of cervical spine showed an acute 11 mm demyelinating lesion at the 3, and a smaller chronic lesion at C5.    PT Comments    Improving, still very uncoordinated.  Emphasis on standing, pre-gait and gait in RW and at rail with HHA.  Great rehab candidate.  Pt able to persevere through her pain and tolerate a long session with short rests.    Follow Up Recommendations  CIR;Supervision/Assistance - 24 hour     Equipment Recommendations  None recommended by PT    Recommendations for Other Services Rehab consult     Precautions / Restrictions Precautions Precautions: Fall Restrictions Weight Bearing Restrictions: No    Mobility  Bed Mobility Overal bed mobility: Needs Assistance Bed Mobility: Supine to Sit     Supine to sit: Min guard     General bed mobility comments: increased time, but generally able to coordinate the scooting well.  Transfers Overall transfer level: Needs assistance Equipment used: Rolling walker (2 wheeled) Transfers: Sit to/from Stand Sit to Stand: Min assist;+2 safety/equipment;+2 physical assistance         General transfer comment: cues for hand placement.  cues for sequence/technique for normalization  Ambulation/Gait Ambulation/Gait assistance: Min assist;+2 physical assistance;Mod assist;+2 safety/equipment Ambulation  Distance (Feet): 100 Feet Assistive device: Rolling walker (2 wheeled) Gait Pattern/deviations: Step-through pattern   Gait velocity interpretation: Below normal speed for age/gender General Gait Details: pt unable to coordinate gait L LE or R LE.  Characterized by varying step lengths, heel/toe or foot flat contact.  Pt tended toward narrowed BOS when unable to look at her feet.  Very little knee buckling and pt was strong enough to overcome a gross misstep.   Stairs            Wheelchair Mobility    Modified Rankin (Stroke Patients Only)       Balance Overall balance assessment: Needs assistance Sitting-balance support: Feet supported Sitting balance-Leahy Scale: Fair (to good) Sitting balance - Comments: worked on unsupported sitting with reaching and w/shifting--adding challenge to sitting   Standing balance support: During functional activity;Bilateral upper extremity supported Standing balance-Leahy Scale: Poor Standing balance comment: Worked on pregait--stepping forward/backward and w/shifting                            Cognition Arousal/Alertness: Awake/alert Behavior During Therapy: WFL for tasks assessed/performed Overall Cognitive Status: Within Functional Limits for tasks assessed                                        Exercises      General Comments        Pertinent Vitals/Pain Pain Assessment: 0-10 Pain Score: 10-Worst pain ever Pain Location: BLE Pain Descriptors / Indicators: Constant Pain Intervention(s): Monitored during session    Home Living  Prior Function            PT Goals (current goals can now be found in the care plan section) Acute Rehab PT Goals Patient Stated Goal: to get stronger and back to independence PT Goal Formulation: With patient Time For Goal Achievement: 06/29/16 Potential to Achieve Goals: Good Progress towards PT goals: Progressing toward goals     Frequency    Min 3X/week      PT Plan Current plan remains appropriate    Co-evaluation              AM-PAC PT "6 Clicks" Daily Activity  Outcome Measure  Difficulty turning over in bed (including adjusting bedclothes, sheets and blankets)?: A Little Difficulty moving from lying on back to sitting on the side of the bed? : A Little Difficulty sitting down on and standing up from a chair with arms (e.g., wheelchair, bedside commode, etc,.)?: Total Help needed moving to and from a bed to chair (including a wheelchair)?: A Lot Help needed walking in hospital room?: A Lot Help needed climbing 3-5 steps with a railing? : Total 6 Click Score: 12    End of Session Equipment Utilized During Treatment: Gait belt Activity Tolerance: Patient tolerated treatment well Patient left: in chair;with call bell/phone within reach;with chair alarm set Nurse Communication: Mobility status PT Visit Diagnosis: Unsteadiness on feet (R26.81);Other abnormalities of gait and mobility (R26.89);Other symptoms and signs involving the nervous system (R29.898)     Time: 5621-3086 PT Time Calculation (min) (ACUTE ONLY): 37 min  Charges:  $Gait Training: 8-22 mins $Therapeutic Activity: 8-22 mins                    G Codes:       07-11-2016  [place] Bing, PT [phone]  (pager)   Eliseo Gum Atalie Oros 2016/07/11, 12:19 PM

## 2016-06-16 NOTE — Progress Notes (Signed)
Inpatient Rehabilitation  Per therapy request, patient was screened by Gunnar Fusi for appropriateness for an Inpatient Acute Rehab consult.  At this time we are recommending an Inpatient Rehab consult.  Text paged MD to request order; please order if you are agreeable.    Carmelia Roller., CCC/SLP Admission Coordinator  [place]  Cell [phone]

## 2016-06-16 NOTE — Progress Notes (Signed)
Inpatient Rehabilitation  Met with patient to discuss team's recommendation for IP Rehab.  Shared booklets and answered questions.  Patient is eager to regain her independence and participate in our program.  BCBS will not allow [place] to initiate insurance authorization this afternoon for Monday.  As a result, will initiate insurance on Monday morning.  Plan for my co-worker Gerlean Ren to follow up Monday for timing of medical readiness, insurance authorization, and bed availability.  Please call with questions.   Carmelia Roller., CCC/SLP Admission Coordinator  [place]  Cell [phone]

## 2016-06-16 NOTE — Progress Notes (Addendum)
   Subjective:  Pt is feeling much improved. She has been working with PT to maximize motor function. Notes that pain is improved today. Sensation also mildly improved.  Objective:  Vital signs in last 24 hours: Vitals:   06/15/16 1825 06/15/16 2029 06/16/16 0041 06/16/16 0610  BP: (!) 167/87 137/69 116/64 (!) 146/75  Pulse: 93 76 64 70  Resp: 16 18 16 16   Temp: 98.7 F (37.1 C) 98 F (36.7 C) 98 F (36.7 C) 97.9 F (36.6 C)  TempSrc: Oral Oral Oral Oral  SpO2: 99% 98% 98% 99%  Weight:      Height:       General Apperance: NAD HEENT: Normocephalic, atraumatic, anicteric sclera Neck: Supple, trachea midline Lungs: Clear to auscultation bilaterally. No wheezes, rhonchi or rales. Breathing comfortably Heart: Regular rate and rhythm, no murmur/rub/gallop Abdomen: Soft, nontender, nondistended, no rebound/guarding Extremities: Warm and well perfused, no edema, still with tenderness to deeper palpation of muscles BLE Skin: No rashes or lesions Neurologic: Alert and interactive. Hyperesthesia resolved. Strength intact BLE. Decreased grip strength persistent and forearm flexor strength improved. Decreased sensation of hands. Anesthesia improved from T12 to L3-4 level, 3+ DTRs throughout, Babinski absent. CNs intact. No visual disturbance.  Assessment/Plan:  Cervical myelitis: Numerous new white matter lesions throughout the cortex which are persistent from 2009 and of unclear etiology. H/o TBI may confound, but MS remains on the differential. New cervical spine lesions. CSF studies pending - cell count, glc, protien wnl. Neurology following, appreciate recommendations -- High dose steroids / Solu-Medrol 500 mg IV BID for 3 days (day 2) -- Protonix 40 mg QD for ulcer ppx -- follow CBGs, SSI PRN -- PT/OT eval and treat -- CIR c/s for dispo -- Tylenol / tramadol PRN severe pain -- Continue cymbalta  FEN/GI: Regular diet, replete electrolytes as needed VTE ppx: Lovenox Code status:  Full code  Dispo: Anticipated discharge in approximately 2-3 day(s).   Holley Raring, MD 06/16/2016, 8:58 AM Pager: [phone]

## 2016-06-16 NOTE — Progress Notes (Signed)
Subjective: Pt endorses less leg pain than yesterday. She is still in pain but has been trying to move her feet and legs as much as possible while in the bed. She is amenable to inpatient PT and highly motivated to work on regaining mobility. She has not had any episodes of incontinence, and is able to void independently, but reports that her stomach still feels "like it has a corset around it."   Objective: Vital signs in last 24 hours: Vitals:   06/15/16 1825 06/15/16 2029 06/16/16 0041 06/16/16 0610  BP: (!) 167/87 137/69 116/64 (!) 146/75  Pulse: 93 76 64 70  Resp: 16 18 16 16   Temp: 98.7 F (37.1 C) 98 F (36.7 C) 98 F (36.7 C) 97.9 F (36.6 C)  TempSrc: Oral Oral Oral Oral  SpO2: 99% 98% 98% 99%  Weight:      Height:       Weight change:   Intake/Output Summary (Last 24 hours) at 06/16/16 0734 Last data filed at 06/15/16 0800  Gross per 24 hour  Intake                0 ml  Output              300 ml  Net             -300 ml   BP (!) 146/75 (BP Location: Left Arm)   Pulse 70   Temp 97.9 F (36.6 C) (Oral)   Resp 16   Ht 5\' 3"  (1.6 m)   Wt 102.1 kg (225 lb 1.6 oz)   SpO2 99%   BMI 39.87 kg/m   General Appearance:    Alert, cooperative, no distress, appears stated age  Head:    Normocephalic, without obvious abnormality, atraumatic  Eyes:    PERRL, conjunctiva/corneas clear, EOM's intact, both eyes.  Ears:    Normal TM's and external ear canals, both ears  Nose:   Nares normal, septum midline, mucosa normal, no drainage    or sinus tenderness  Throat:   Lips, mucosa, and tongue normal; teeth and gums normal  Neck:   Supple, symmetrical, trachea midline, no enlargement/tenderness/nodules or JVD.  Lungs:     Clear to auscultation bilaterally, respirations unlabored  Chest Wall:    No tenderness or deformity   Heart:    Regular rate and rhythm, S1 and S2 normal, no murmur, rub   or gallop  Extremities:   Extremities normal, atraumatic, no cyanosis or edema    Pulses:   2+ and symmetric all extremities  Skin:   Skin color, texture, turgor normal, no rashes or lesions  Neurologic:   Grip strength slightly weaker than yesterday; wrist extension stronger than flexion. Loss of sensation below T10, vs. T4 yesterday. LE hyperesthesia much improved. 3+ DTRs throughout. Rapid alternating movements and finger-to-nose are slow but accurate; pt is able to remove and replace glasses with ease.   Lab Results:  CK: 59 (was 318 in 2010)  CSF analysis, 5/17: Appearance: clear, clear Glucose: 60 RBC: 0, 12 Lyphs: rare, few Other cells: too few to count, too few to count Color: colorless, colorless Supernatant: not indicated, not indicated Total protein: 42 WBC: 2, 0   Micro Results: Recent Results (from the past 240 hour(s))  CSF culture with Stat gram stain     Status: None (Preliminary result)   Collection Time: 06/15/16  1:58 PM  Result Value Ref Range Status   Specimen Description CSF  Final  Special Requests NONE  Final   Gram Stain   Final    CYTOSPIN SMEAR WBC PRESENT,BOTH PMN AND MONONUCLEAR NO ORGANISMS SEEN    Culture PENDING  Incomplete   Report Status PENDING  Incomplete   Studies/Results:  Medications: Scheduled Meds: . enoxaparin (LOVENOX) injection  40 mg Subcutaneous Q24H  . insulin aspart  0-5 Units Subcutaneous QHS  . insulin aspart  0-9 Units Subcutaneous TID WC  . pantoprazole  40 mg Oral Daily   Continuous Infusions: . methylPREDNISolone (SOLU-MEDROL) injection Stopped (06/16/16 0011)   PRN Meds:.acetaminophen, traMADol   Assessment/Plan:  Transverse myelitis of unknown etiology: 06/14/16 MRI demonstrated multiple white matter lesions throughout the cortex, along with two discrete new C spine lesions. MS remains on the differential, and CSF studies (including assessment for oligoclonal banding) pending. CNS findings on MRI are less suggestive of GB Syndrome. Improvement in pt's paresthesias and sensation (sensory  loss below T10 today, vs. below T4 yesterday) indicate good response to steroids. Pt denies incontinence at present and has been voiding independently; foley not indicated at this time. She is amenable to inpatient PT and highly motivated to work on regaining mobility. Neurology following, appreciate recommendations. -Prednisone 20 mg tablet + Solu-Medrol 500 mg IV BIDfor 3 days; day 2/3.  -Ulcer prophylaxis: Protonix 40 mg QD  -Pain regimen: Tylenol + tramadol PRN -Continue cymbalta -PT/OT and CIR consults  FEN/GI:Regular diet, replete electrolytes as needed VTE ppx: Lovenox Code status: Full code Dispo: Anticipated discharge in approximately 2-3 days, likely to inpatient rehab facility.   This is a Careers information officer Note.  The care of the patient was discussed with Dr. Holley Raring and the assessment and plan formulated with their assistance.  Please see their attached note for official documentation of the daily encounter.   LOS: 1 day   Jeri Cos, Medical Student 06/16/2016, 7:34 AM

## 2016-06-16 NOTE — Progress Notes (Signed)
CSW consulted for SNF as second option to CIR, if needed. CSW explained SNF placement as another option for patient if insurance doesn't provide auth for CIR. Pt and pt's daughter are not interested in pursuing SNF placement as a second option; if insurance doesn't approve CIR, will want home health services instead.  CSW no longer needed for discharge planning; CSW signing off.  [address], [place]

## 2016-06-16 NOTE — Consult Note (Signed)
Physical Medicine and Rehabilitation Consult Reason for Consult: Decreased functional mobility with increasing lower extremity weakness and numbness Referring Physician: Internal medicine   HPI: Tamara [name] is a 56 y.o. right handed female with history of sciatica, irritable bowel syndrome, chronic anemia. Per chart review patient lives with spouse. Independent prior to admission. 2 level home with bedroom on first floor and 4 steps to entry. Husband is disabled. She presented 06/14/2016 with progressive numbness involving her trunk from the chest including lower extremities as well as numbness involving both hands. MRI of the brain showed a 5 cm white matter lesion concerning for chronic demyelination, dominant 9 mm right parietal lesion. MRI cervical thoracic and lumbar spine shows acute l 11 mm C3 demyelinating lesion. Chronic 5 mm C5 demyelinating lesion. No MR findings of demyelination and thoracic spinal cord or lumbar spine. CSF studies pending. Placed on IV Solu-Medrol per neurology services suspect cervical myelitis/multiple sclerosis. Subcutaneous Lovenox for DVT prophylaxis. Physical and occupational therapy evaluation completed with recommendations of physical medicine rehabilitation consult.   Review of Systems  Constitutional: Negative for chills and fever.  HENT: Negative for hearing loss and tinnitus.   Eyes: Negative for blurred vision and double vision.  Respiratory: Negative for cough and shortness of breath.   Cardiovascular: Negative for chest pain, palpitations and leg swelling.  Gastrointestinal: Positive for diarrhea. Negative for nausea and vomiting.  Genitourinary: Negative for dysuria, flank pain and hematuria.  Musculoskeletal: Positive for back pain and myalgias.  Skin: Negative for rash.  Neurological: Positive for tingling and weakness. Negative for seizures.       Numbness of the hands and lower extremities  All other systems reviewed and are  negative.  Past Medical History:  Diagnosis Date  . Anemia   . Bowel obstruction ([place])   . GERD (gastroesophageal reflux disease)   . IBS (irritable bowel syndrome)    at age of 79  . Sciatica 2009   Past Surgical History:  Procedure Laterality Date  . ABDOMINAL HYSTERECTOMY  2008   cervix and right ovary still intact  . KNEE ARTHROSCOPY  2010 and 2011   Left knee, x2   Family History  Problem Relation Age of Onset  . Cancer Father        Gallbladder  . Gallbladder disease Father   . Hypertension Brother   . Gallbladder disease Sister        x3  . Gallbladder disease Paternal Grandmother   . Colon cancer Neg Hx   . Colon polyps Neg Hx   . Esophageal cancer Neg Hx   . Kidney disease Neg Hx    Social History:  reports that she has never smoked. She has never used smokeless tobacco. She reports that she does not drink alcohol or use drugs. Allergies:  Allergies  Allergen Reactions  . Codeine Other (See Comments)    Delusions  . Dilaudid [Hydromorphone Hcl] Swelling    Tongue swells   . Paxil [Paroxetine Hcl] Other (See Comments)    Hallucinations and heavy periods   . Adhesive [Tape] Hives, Itching and Rash    PAPER TAPE=   . Augmentin [Amoxicillin-Pot Clavulanate] Rash   Medications Prior to Admission  Medication Sig Dispense Refill  . ACAI BERRY PO Take 1 capsule by mouth daily as needed (for vitamin).    Marland Kitchen b complex vitamins tablet Take 1 tablet by mouth daily.    . benzocaine-menthol (CHLORAEPTIC) 6-10 MG lozenge Take 1 lozenge by mouth as  needed for sore throat. 18 tablet 0  . Biotin w/ Vitamins C & E (HAIR/SKIN/NAILS PO) Take 1 tablet by mouth daily.    . Camphor-Eucalyptus-Menthol (VICKS VAPORUB) 4.73-1.2-2.6 % OINT Apply 1 application topically daily as needed (for feet).    . cetirizine (ZYRTEC ALLERGY) 10 MG tablet Take 1 tablet (10 mg total) by mouth daily. (Patient taking differently: Take 10 mg by mouth daily as needed for allergies. ) 30 tablet 2  .  cholecalciferol (VITAMIN D) 400 UNITS TABS tablet Take 400 Units by mouth daily.     . DULoxetine (CYMBALTA) 30 MG capsule Take 1 capsule (30 mg total) by mouth daily. 30 capsule 0  . fluticasone (FLONASE) 50 MCG/ACT nasal spray Place 2 sprays into both nostrils daily. (Patient taking differently: Place 2 sprays into both nostrils daily as needed for allergies. ) 16 g 2  . furosemide (LASIX) 20 MG tablet Take 1 tablet (20 mg total) by mouth daily as needed for fluid or edema. 30 tablet 3  . GARCINIA CAMBOGIA-CHROMIUM PO Take 1 capsule by mouth daily as needed (for vitamin).    Marland Kitchen guaiFENesin (MUCINEX) 600 MG 12 hr tablet Take 1 tablet (600 mg total) by mouth 2 (two) times daily. (Patient taking differently: Take 600 mg by mouth 2 (two) times daily as needed for cough or to loosen phlegm. ) 14 tablet 0  . guaiFENesin-codeine 100-10 MG/5ML syrup Take 5 mLs by mouth every 4 (four) hours as needed for cough. 120 mL 0  . ibuprofen (ADVIL,MOTRIN) 800 MG tablet Take 1 tablet (800 mg total) by mouth every 8 (eight) hours as needed. 30 tablet 0  . Multiple Vitamin (MULTIVITAMIN) tablet Take 1 tablet by mouth daily.    . Omega-3 Fatty Acids (FISH OIL) 1000 MG CAPS Take 1,000-2,000 mg by mouth See admin instructions. Take 2 capsules in the morning and take 1 capsule at bedtime     . oxymetazoline (AFRIN NASAL SPRAY) 0.05 % nasal spray Place 1 spray into both nostrils 2 (two) times daily. (Patient taking differently: Place 1 spray into both nostrils 2 (two) times daily as needed for congestion. ) 30 mL 0  . Polyethyl Glycol-Propyl Glycol (SYSTANE) 0.4-0.3 % SOLN Place 1 drop into both eyes daily as needed (for dry eyes).    . Potassium 75 MG TABS Take 1 tablet by mouth daily.     . predniSONE (DELTASONE) 20 MG tablet Take 2 tablets (40 mg total) by mouth daily with breakfast. For the next four days 8 tablet 0  . vitamin E 100 UNIT capsule Take 100 Units by mouth daily.      Home: Home Living Family/patient  expects to be discharged to:: Private residence Living Arrangements: Spouse/significant other Available Help at Discharge: Family, Available 24 hours/day Type of Home: House Home Access: Stairs to enter Technical brewer of Steps: 4 Entrance Stairs-Rails: None Home Layout: Two level, Able to live on main level with bedroom/bathroom Alternate Level Stairs-Number of Steps: flight Alternate Level Stairs-Rails: Right Bathroom Shower/Tub: Multimedia programmer: Handicapped height Bathroom Accessibility: Yes Home Equipment: Hand held shower head, Grab bars - tub/shower Additional Comments: husband is disabled (heart disease), Pt cares for her 43 year old grandaughter  Functional History: Prior Function Level of Independence: Independent Comments: pt states that she cares for her husband and babysits her granddaughter every day. She enjoys painting and decorating cakes Functional Status:  Mobility: Bed Mobility Overal bed mobility: Needs Assistance Bed Mobility: Supine to Sit, Sit to Supine  Supine to sit: Min guard Sit to supine: Min guard General bed mobility comments: increased time, vc'ing for sequencing and close min guard for safety Transfers Overall transfer level: Needs assistance Equipment used: 2 person hand held assist Transfers: Sit to/from Stand, Stand Pivot Transfers Sit to Stand: Mod assist, +2 safety/equipment Stand pivot transfers: Mod assist, +2 safety/equipment General transfer comment: increased time, vc'ing for bilateral hand placement, min-mod A as pt very unstable with rise into standing from bed and BSC Ambulation/Gait General Gait Details: deferred at this time as pt unable to safely advance either foot forwards with RW per Egress Test. pt able to take 3 side steps laterally in both directions; however, requiring mod A with significant instability and LOB posteriorly with knees buckling    ADL: ADL Overall ADL's : Needs  assistance/impaired Eating/Feeding: Set up, With adaptive utensils, Cueing for compensatory techinques, Sitting Grooming: Wash/dry hands, Wash/dry face, Oral care, Moderate assistance, With adaptive equipment, Sitting Grooming Details (indicate cue type and reason): used wash cloth to build up handles of toothbrush for oral care. Pt require BUE to perform. Pt required assist to maintain control of washcloth during face washing. Upper Body Bathing: Sitting, Minimal assistance Upper Body Bathing Details (indicate cue type and reason): Pt unable to maintain grip on washcloth during UB bathing, she required assist for her back Lower Body Bathing: Moderate assistance, Sitting/lateral leans, With adaptive equipment Lower Body Bathing Details (indicate cue type and reason): BLE very sensative to touch, Pt's face visually grimacing during cleaning. Pt able to get thighs, but required assist for the knees down Upper Body Dressing : Set up, Sitting Lower Body Dressing: Sit to/from stand, Moderate assistance, Maximal assistance Lower Body Dressing Details (indicate cue type and reason): max a to don socks sitting in chair Toilet Transfer: +2 for safety/equipment, Moderate assistance, BSC, Stand-pivot Toilet Transfer Details (indicate cue type and reason): unable to grip RW to assist with transfer, right leg very unstable during transfer Toileting- Clothing Manipulation and Hygiene: Minimal assistance, Sitting/lateral lean Functional mobility during ADLs: Moderate assistance, +2 for safety/equipment General ADL Comments: Pt extremely motivated and this is a drastic change in function for Pt  Cognition: Cognition Overall Cognitive Status: Within Functional Limits for tasks assessed Orientation Level: Oriented X4 Cognition Arousal/Alertness: Awake/alert Behavior During Therapy: WFL for tasks assessed/performed Overall Cognitive Status: Within Functional Limits for tasks assessed  Blood pressure (!)  146/75, pulse 70, temperature 97.9 F (36.6 C), temperature source Oral, resp. rate 16, height 5\' 3"  (1.6 m), weight 102.1 kg (225 lb 1.6 oz), SpO2 99 %. Physical Exam  Constitutional: She is oriented to person, place, and time. She appears well-developed.  HENT:  Head: Normocephalic.  Eyes: EOM are normal.  Neck: Normal range of motion. Neck supple. No thyromegaly present.  Cardiovascular: Normal rate and regular rhythm.   Respiratory: Effort normal and breath sounds normal. No respiratory distress.  GI: Soft. Bowel sounds are normal. She exhibits no distension.  Neurological: She is alert and oriented to person, place, and time.  Follows full commands. Cognitively intact. UE motor 4/5 deltoid, biceps, triceps, wrists and hands limited by discomfort. LE's 3 to 4/5 HF, KE, 4/5 ADF/PF. Decreased sensation to LT in both legs,trunk, hands. Displays dysesthesias in hands/legs limiting contract/resistance testing  Skin: Skin is warm and dry.  Psychiatric: She has a normal mood and affect. Her behavior is normal.    Results for orders placed or performed during the hospital encounter of 06/14/16 (from the past 24 hour(s))  CK     Status: None   Collection Time: 06/15/16 10:37 AM  Result Value Ref Range   Total CK 59 38 - 234 U/L  Glucose, capillary     Status: Abnormal   Collection Time: 06/15/16 11:59 AM  Result Value Ref Range   Glucose-Capillary 119 (H) 65 - 99 mg/dL   Comment 1 Notify RN    Comment 2 Document in Chart   CSF cell count with differential     Status: None   Collection Time: 06/15/16 12:01 PM  Result Value Ref Range   Tube # 4    Color, CSF COLORLESS COLORLESS   Appearance, CSF CLEAR CLEAR   Supernatant NOT INDICATED    RBC Count, CSF 0 0 /cu mm   WBC, CSF 2 0 - 5 /cu mm   Lymphs, CSF RARE 40 - 80 %   Other Cells, CSF TOO FEW TO COUNT, SMEAR AVAILABLE FOR REVIEW   CSF cell count with differential     Status: Abnormal   Collection Time: 06/15/16 12:03 PM  Result  Value Ref Range   Tube # 1    Color, CSF COLORLESS COLORLESS   Appearance, CSF CLEAR CLEAR   Supernatant NOT INDICATED    RBC Count, CSF 12 (H) 0 /cu mm   WBC, CSF 0 0 - 5 /cu mm   Lymphs, CSF FEW 40 - 80 %   Other Cells, CSF TOO FEW TO COUNT, SMEAR AVAILABLE FOR REVIEW   Protein and glucose, CSF     Status: None   Collection Time: 06/15/16 12:03 PM  Result Value Ref Range   Glucose, CSF 60 40 - 70 mg/dL   Total  Protein, CSF 42 15 - 45 mg/dL  CSF culture with Stat gram stain     Status: None (Preliminary result)   Collection Time: 06/15/16  1:58 PM  Result Value Ref Range   Specimen Description CSF    Special Requests NONE    Gram Stain      CYTOSPIN SMEAR WBC PRESENT,BOTH PMN AND MONONUCLEAR NO ORGANISMS SEEN    Culture PENDING    Report Status PENDING   Glucose, capillary     Status: Abnormal   Collection Time: 06/15/16  4:59 PM  Result Value Ref Range   Glucose-Capillary 119 (H) 65 - 99 mg/dL   Comment 1 Notify RN    Comment 2 Document in Chart   Vitamin B12     Status: Abnormal   Collection Time: 06/15/16  6:09 PM  Result Value Ref Range   Vitamin B-12 2,209 (H) 180 - 914 pg/mL  RPR     Status: None   Collection Time: 06/15/16  6:09 PM  Result Value Ref Range   RPR Ser Ql Non Reactive Non Reactive  TSH     Status: None   Collection Time: 06/15/16  6:09 PM  Result Value Ref Range   TSH 0.351 0.350 - 4.500 uIU/mL  Glucose, capillary     Status: Abnormal   Collection Time: 06/15/16  8:56 PM  Result Value Ref Range   Glucose-Capillary 141 (H) 65 - 99 mg/dL   Comment 1 Notify RN    Comment 2 Document in Chart   Glucose, capillary     Status: Abnormal   Collection Time: 06/16/16  6:27 AM  Result Value Ref Range   Glucose-Capillary 135 (H) 65 - 99 mg/dL   Mr Brain W And Wo Contrast  Result Date: 06/14/2016 CLINICAL DATA:  Bilateral hand  numbness, leg heaviness and urinary retention for 4 days. EXAM: MRI HEAD WITHOUT AND WITH CONTRAST TECHNIQUE: Multiplanar,  multiecho pulse sequences of the brain and surrounding structures were obtained without and with intravenous contrast. CONTRAST:  35mL MULTIHANCE GADOBENATE DIMEGLUMINE 529 MG/ML IV SOLN COMPARISON:  None. FINDINGS: INTRACRANIAL CONTENTS: No reduced diffusion to suggest acute ischemia or hyperacute demyelination. No susceptibility artifact to suggest hemorrhage. The ventricles and sulci are normal for patient's age. 9 mm ovoid T2 hyperintensity RIGHT parietal white matter with low T1 signal in T2 shine through. 4 additional subcentimeter white matter T2 hyperintense lesions including RIGHT frontal horn periatrial white matter. Linear LEFT parietal perivascular space. No masses, mass effect. No abnormal intraparenchymal or extra-axial enhancement. No abnormal extra-axial fluid collections. No extra-axial masses. VASCULAR: Normal major intracranial vascular flow voids present at skull base. SKULL AND UPPER CERVICAL SPINE: No abnormal sellar expansion. No suspicious calvarial bone marrow signal. Craniocervical junction maintained. SINUSES/ORBITS: The mastoid air-cells and included paranasal sinuses are well-aerated.The included ocular globes and orbital contents are non-suspicious. OTHER: None. IMPRESSION: 5 subcentimeter white matter lesions concerning for chronic demyelination, dominant 9 mm RIGHT parietal lesion. No parenchymal brain volume loss for age nor acute intracranial process. Electronically Signed   By: Elon Alas M.D.   On: 06/14/2016 21:14   Mr Lumbar Spine W Contrast  Result Date: 06/14/2016 CLINICAL DATA:  Bilateral hand numbness, leg heaviness and urinary retention for 4 days. EXAM: MRI CERVICAL SPINE WITHOUT AND WITH CONTRAST MRI THORACIC SPINE WITHOUT AND WITH CONTRAST MRI LUMBAR SPINE WITH CONTRAST TECHNIQUE: Multisequence MR imaging of the spine from the cervical spine to the thoracolumbar junction was performed prior to and following IV contrast administration for evaluation of  demyelinating disease. Multisequence MRI imaging of the lumbar spine obtained without intravenous contrast. CONTRAST:  49mL MULTIHANCE GADOBENATE DIMEGLUMINE 529 MG/ML IV SOLN COMPARISON:  MRI of the lumbar spine without contrast Jun 13, 2016 FINDINGS: MRI CERVICAL SPINE FINDINGS ALIGNMENT: Straightened cervical lordosis.  No malalignment. VERTEBRAE/DISCS: Vertebral bodies are intact. Intervertebral disc morphology's and signal are normal. No abnormal bone marrow signal or acute osseous process. Mild chronic discogenic endplate changes W9-6 and to lesser extent C6-7. No abnormal osseous or disc enhancement. CORD:Expansile 11 mm T2 bright lesion dorsal C3 with faint enhancement. 5 mm nonenhancing lesion within dorsal spinal cord at C5. No myelomalacia or syrinx. No abnormal leptomeningeal or epidural enhancement. POSTERIOR FOSSA, VERTEBRAL ARTERIES, PARASPINAL TISSUES: No MR findings of ligamentous injury. Vertebral artery flow voids present. Included posterior fossa and paraspinal soft tissues are normal. DISC LEVELS: C2-3: No disc bulge, canal stenosis nor neural foraminal narrowing. C3-4, C4-5: Tiny central disc protrusion mild facet arthropathy. No canal stenosis or neural foraminal narrowing. C5-6: Annular bulging without canal stenosis or neural foraminal narrowing. C6-7: Tiny central disc protrusion without canal stenosis or neural foraminal narrowing. C7-T1: No disc bulge, canal stenosis nor neural foraminal narrowing. MRI THORACIC SPINE FINDINGS- mildly motion degraded examination. ALIGNMENT: Maintenance of the thoracic kyphosis. No malalignment.No abnormal or acute bone marrow signal. No abnormal osseous or disc enhancement. VERTEBRAE/DISCS: Vertebral bodies are intact. Intervertebral discs morphology and signal are normal. CORD: Thoracic spinal cord is normal morphology and signal characteristics to the level of the conus medullaris. No abnormal cord, leptomeningeal or epidural enhancement. Please note,  motion degrades sensitivity for subtle potential cord signal abnormality. PREVERTEBRAL AND PARASPINAL SOFT TISSUES: Multiple nonenhancing T2 bright masses in the liver most compatible with cysts as demonstrated on CT abdomen and pelvis July 24, 2008. [place]  LEVELS: T6-7: Small central disc protrusion without canal stenosis or neural foraminal narrowing. No disc bulge, canal stenosis nor neural foraminal narrowing at the remaining thoracic levels. MRI LUMBAR SPINE FINDINGS VERTEBRAE:No abnormal osseous or disc enhancement. CONUS MEDULLARIS: No abnormal cord, leptomeningeal or epidural enhancement. IMPRESSION: MRI CERVICAL SPINE: Acute 11 mm C3 demyelinating lesion. Chronic 5 mm C5 demyelinating lesion. Early degenerative change of the cervical spine without canal stenosis or neural foraminal narrowing. MRI THORACIC SPINE: No MR findings of demyelination in thoracic spinal cord. Small T6-7 disc protrusion without canal stenosis or neural foraminal narrowing at any thoracic level. MRI LUMBAR SPINE: No abnormal enhancement. Electronically Signed   By: Elon Alas M.D.   On: 06/14/2016 21:36   Mr Cervical Spine W Or Wo Contrast  Result Date: 06/14/2016 CLINICAL DATA:  Bilateral hand numbness, leg heaviness and urinary retention for 4 days. EXAM: MRI CERVICAL SPINE WITHOUT AND WITH CONTRAST MRI THORACIC SPINE WITHOUT AND WITH CONTRAST MRI LUMBAR SPINE WITH CONTRAST TECHNIQUE: Multisequence MR imaging of the spine from the cervical spine to the thoracolumbar junction was performed prior to and following IV contrast administration for evaluation of demyelinating disease. Multisequence MRI imaging of the lumbar spine obtained without intravenous contrast. CONTRAST:  26mL MULTIHANCE GADOBENATE DIMEGLUMINE 529 MG/ML IV SOLN COMPARISON:  MRI of the lumbar spine without contrast Jun 13, 2016 FINDINGS: MRI CERVICAL SPINE FINDINGS ALIGNMENT: Straightened cervical lordosis.  No malalignment. VERTEBRAE/DISCS: Vertebral  bodies are intact. Intervertebral disc morphology's and signal are normal. No abnormal bone marrow signal or acute osseous process. Mild chronic discogenic endplate changes X9-1 and to lesser extent C6-7. No abnormal osseous or disc enhancement. CORD:Expansile 11 mm T2 bright lesion dorsal C3 with faint enhancement. 5 mm nonenhancing lesion within dorsal spinal cord at C5. No myelomalacia or syrinx. No abnormal leptomeningeal or epidural enhancement. POSTERIOR FOSSA, VERTEBRAL ARTERIES, PARASPINAL TISSUES: No MR findings of ligamentous injury. Vertebral artery flow voids present. Included posterior fossa and paraspinal soft tissues are normal. DISC LEVELS: C2-3: No disc bulge, canal stenosis nor neural foraminal narrowing. C3-4, C4-5: Tiny central disc protrusion mild facet arthropathy. No canal stenosis or neural foraminal narrowing. C5-6: Annular bulging without canal stenosis or neural foraminal narrowing. C6-7: Tiny central disc protrusion without canal stenosis or neural foraminal narrowing. C7-T1: No disc bulge, canal stenosis nor neural foraminal narrowing. MRI THORACIC SPINE FINDINGS- mildly motion degraded examination. ALIGNMENT: Maintenance of the thoracic kyphosis. No malalignment.No abnormal or acute bone marrow signal. No abnormal osseous or disc enhancement. VERTEBRAE/DISCS: Vertebral bodies are intact. Intervertebral discs morphology and signal are normal. CORD: Thoracic spinal cord is normal morphology and signal characteristics to the level of the conus medullaris. No abnormal cord, leptomeningeal or epidural enhancement. Please note, motion degrades sensitivity for subtle potential cord signal abnormality. PREVERTEBRAL AND PARASPINAL SOFT TISSUES: Multiple nonenhancing T2 bright masses in the liver most compatible with cysts as demonstrated on CT abdomen and pelvis July 24, 2008. DISC LEVELS: T6-7: Small central disc protrusion without canal stenosis or neural foraminal narrowing. No disc bulge,  canal stenosis nor neural foraminal narrowing at the remaining thoracic levels. MRI LUMBAR SPINE FINDINGS VERTEBRAE:No abnormal osseous or disc enhancement. CONUS MEDULLARIS: No abnormal cord, leptomeningeal or epidural enhancement. IMPRESSION: MRI CERVICAL SPINE: Acute 11 mm C3 demyelinating lesion. Chronic 5 mm C5 demyelinating lesion. Early degenerative change of the cervical spine without canal stenosis or neural foraminal narrowing. MRI THORACIC SPINE: No MR findings of demyelination in thoracic spinal cord. Small T6-7 disc protrusion without canal stenosis or  neural foraminal narrowing at any thoracic level. MRI LUMBAR SPINE: No abnormal enhancement. Electronically Signed   By: Elon Alas M.D.   On: 06/14/2016 21:36   Mr Thoracic Spine W Wo Contrast  Result Date: 06/14/2016 CLINICAL DATA:  Bilateral hand numbness, leg heaviness and urinary retention for 4 days. EXAM: MRI CERVICAL SPINE WITHOUT AND WITH CONTRAST MRI THORACIC SPINE WITHOUT AND WITH CONTRAST MRI LUMBAR SPINE WITH CONTRAST TECHNIQUE: Multisequence MR imaging of the spine from the cervical spine to the thoracolumbar junction was performed prior to and following IV contrast administration for evaluation of demyelinating disease. Multisequence MRI imaging of the lumbar spine obtained without intravenous contrast. CONTRAST:  50mL MULTIHANCE GADOBENATE DIMEGLUMINE 529 MG/ML IV SOLN COMPARISON:  MRI of the lumbar spine without contrast Jun 13, 2016 FINDINGS: MRI CERVICAL SPINE FINDINGS ALIGNMENT: Straightened cervical lordosis.  No malalignment. VERTEBRAE/DISCS: Vertebral bodies are intact. Intervertebral disc morphology's and signal are normal. No abnormal bone marrow signal or acute osseous process. Mild chronic discogenic endplate changes Y3-0 and to lesser extent C6-7. No abnormal osseous or disc enhancement. CORD:Expansile 11 mm T2 bright lesion dorsal C3 with faint enhancement. 5 mm nonenhancing lesion within dorsal spinal cord at C5.  No myelomalacia or syrinx. No abnormal leptomeningeal or epidural enhancement. POSTERIOR FOSSA, VERTEBRAL ARTERIES, PARASPINAL TISSUES: No MR findings of ligamentous injury. Vertebral artery flow voids present. Included posterior fossa and paraspinal soft tissues are normal. DISC LEVELS: C2-3: No disc bulge, canal stenosis nor neural foraminal narrowing. C3-4, C4-5: Tiny central disc protrusion mild facet arthropathy. No canal stenosis or neural foraminal narrowing. C5-6: Annular bulging without canal stenosis or neural foraminal narrowing. C6-7: Tiny central disc protrusion without canal stenosis or neural foraminal narrowing. C7-T1: No disc bulge, canal stenosis nor neural foraminal narrowing. MRI THORACIC SPINE FINDINGS- mildly motion degraded examination. ALIGNMENT: Maintenance of the thoracic kyphosis. No malalignment.No abnormal or acute bone marrow signal. No abnormal osseous or disc enhancement. VERTEBRAE/DISCS: Vertebral bodies are intact. Intervertebral discs morphology and signal are normal. CORD: Thoracic spinal cord is normal morphology and signal characteristics to the level of the conus medullaris. No abnormal cord, leptomeningeal or epidural enhancement. Please note, motion degrades sensitivity for subtle potential cord signal abnormality. PREVERTEBRAL AND PARASPINAL SOFT TISSUES: Multiple nonenhancing T2 bright masses in the liver most compatible with cysts as demonstrated on CT abdomen and pelvis July 24, 2008. DISC LEVELS: T6-7: Small central disc protrusion without canal stenosis or neural foraminal narrowing. No disc bulge, canal stenosis nor neural foraminal narrowing at the remaining thoracic levels. MRI LUMBAR SPINE FINDINGS VERTEBRAE:No abnormal osseous or disc enhancement. CONUS MEDULLARIS: No abnormal cord, leptomeningeal or epidural enhancement. IMPRESSION: MRI CERVICAL SPINE: Acute 11 mm C3 demyelinating lesion. Chronic 5 mm C5 demyelinating lesion. Early degenerative change of the  cervical spine without canal stenosis or neural foraminal narrowing. MRI THORACIC SPINE: No MR findings of demyelination in thoracic spinal cord. Small T6-7 disc protrusion without canal stenosis or neural foraminal narrowing at any thoracic level. MRI LUMBAR SPINE: No abnormal enhancement. Electronically Signed   By: Elon Alas M.D.   On: 06/14/2016 21:36    Assessment/Plan: Diagnosis: Gait deficits,sensory loss related to demyelinating disease of brain and cervico-thoracic cord 1. Does the need for close, 24 hr/day medical supervision in concert with the patient's rehab needs make it unreasonable for this patient to be served in a less intensive setting? Yes 2. Co-Morbidities requiring supervision/potential complications: neurogenic bladder, neurogenic bowel, pain mgt 3. Due to bladder management, bowel management, safety, skin/wound care,  disease management, medication administration, pain management and patient education, does the patient require 24 hr/day rehab nursing? Yes 4. Does the patient require coordinated care of a physician, rehab nurse, PT (1-2 hrs/day, 5 days/week) and OT (1-2 hrs/day, 5 days/week) to address physical and functional deficits in the context of the above medical diagnosis(es)? Yes Addressing deficits in the following areas: balance, endurance, locomotion, strength, transferring, bowel/bladder control, bathing, dressing, feeding, grooming, toileting and psychosocial support 5. Can the patient actively participate in an intensive therapy program of at least 3 hrs of therapy per day at least 5 days per week? Yes 6. The potential for patient to make measurable gains while on inpatient rehab is excellent 7. Anticipated functional outcomes upon discharge from inpatient rehab are modified independent  with PT, modified independent with OT, n/a with SLP. 8. Estimated rehab length of stay to reach the above functional goals is: 17-20 days 9. Anticipated D/C setting:  Home 10. Anticipated post D/C treatments: HH therapy and Outpatient therapy 11. Overall Rehab/Functional Prognosis: excellent  RECOMMENDATIONS: This patient's condition is appropriate for continued rehabilitative care in the following setting: CIR Patient has agreed to participate in recommended program. Yes Note that insurance prior authorization may be required for reimbursement for recommended care.  Comment: Pt is extremely motivated. Rehab Admissions Coordinator to follow up.  Thanks,  [name], MD, Mellody Drown    Cathlyn Parsons., PA-C 06/16/2016

## 2016-06-16 NOTE — Progress Notes (Signed)
Occupational Therapy Treatment Patient Details Name: Tamara [name] MRN: [id number] DOB: 1960/04/15 Today's Date: 06/16/2016    History of present illness Pt is an 56 y.o. female with a history of sciatica, irritable bowel syndrome, GERD and anemia, presenting with progressive numbness involving her trunk from the chest downward including lower extremities as well as numbness involving both hands. Onset of symptoms was on 06/09/2016. Loss of sensation includes sacral numbness. She's had no incontinence of urine or stool. MRI of the brain showed multiple subcentimeter T2 abnormalities indicative of probable demyelinating disease. MRI of cervical spine showed an acute 11 mm demyelinating lesion at the 3, and a smaller chronic lesion at C5.   OT comments  Pt progressing well. Ambulated to the bathroom and toileted with 3 in 1 over toilet with min assist. Washed hands with set up in sitting. Pt did not appear to have pain during mobility. Reaching in all planes and able to maintain grasp of walker and guide it without assist.   Follow Up Recommendations  CIR;Supervision/Assistance - 24 hour    Equipment Recommendations  3 in 1 bedside commode    Recommendations for Other Services      Precautions / Restrictions Precautions Precautions: Fall Restrictions Weight Bearing Restrictions: No       Mobility Bed Mobility      General bed mobility comments: pt in chair  Transfers Overall transfer level: Needs assistance Equipment used: Rolling walker (2 wheeled) Transfers: Sit to/from Stand Sit to Stand: Min assist         General transfer comment: cues for hand placement, steadying assist    Balance Overall balance assessment: Needs assistance Sitting-balance support: Feet supported Sitting balance-Leahy Scale: Good Sitting balance - Comments: worked on unsupported sitting with reaching and w/shifting--adding challenge to sitting   Standing balance support: During functional  activity;Bilateral upper extremity supported Standing balance-Leahy Scale: Poor Standing balance comment: reliant on walker for ambulation                           ADL either performed or assessed with clinical judgement   ADL Overall ADL's : Needs assistance/impaired     Grooming: Wash/dry hands;Sitting;Set up                   Toilet Transfer: Minimal assistance;Ambulation;RW;BSC (over toilet) Toilet Transfer Details (indicate cue type and reason): cues for hand placement Toileting- Clothing Manipulation and Hygiene: Min guard;Sit to/from stand       Functional mobility during ADLs: Minimal assistance;Rolling walker General ADL Comments: pt with improved UB strength with ability to guide walker without assist and use effectively for pushing up from chair and BSC as well as to wash her hands      Vision       Perception     Praxis      Cognition Arousal/Alertness: Awake/alert Behavior During Therapy: WFL for tasks assessed/performed Overall Cognitive Status: Within Functional Limits for tasks assessed                                          Exercises     Shoulder Instructions       General Comments      Pertinent Vitals/ Pain       Pain Assessment: Faces Pain Score: 10-Worst pain ever Faces Pain Scale: No hurt Pain Location: BLE Pain Descriptors /  Indicators: Constant Pain Intervention(s): Monitored during session  Home Living                                          Prior Functioning/Environment              Frequency  Min 3X/week        Progress Toward Goals  OT Goals(current goals can now be found in the care plan section)  Progress towards OT goals: Progressing toward goals  Acute Rehab OT Goals Patient Stated Goal: to get stronger and back to independence OT Goal Formulation: With patient Time For Goal Achievement: 06/29/16 Potential to Achieve Goals: Good  Plan Discharge plan  remains appropriate    Co-evaluation                 AM-PAC PT "6 Clicks" Daily Activity     Outcome Measure   Help from another person eating meals?: A Little Help from another person taking care of personal grooming?: A Little Help from another person toileting, which includes using toliet, bedpan, or urinal?: A Little Help from another person bathing (including washing, rinsing, drying)?: A Lot Help from another person to put on and taking off regular upper body clothing?: A Little Help from another person to put on and taking off regular lower body clothing?: A Lot 6 Click Score: 16    End of Session Equipment Utilized During Treatment: Gait belt;Rolling walker  OT Visit Diagnosis: Unsteadiness on feet (R26.81);Muscle weakness (generalized) (M62.81);Ataxia, unspecified (R27.0);Other symptoms and signs involving the nervous system (R29.898);Pain   Activity Tolerance Patient tolerated treatment well   Patient Left in chair;with call bell/phone within reach;with chair alarm set;with family/visitor present   Nurse Communication Mobility status        Time: 4235-3614 OT Time Calculation (min): 18 min  Charges: OT General Charges $OT Visit: 1 Procedure OT Treatments $Self Care/Home Management : 8-22 mins    Malka So 06/16/2016, 12:59 PM  [phone]

## 2016-06-16 NOTE — NC FL2 (Signed)
[place] MEDICAID FL2 LEVEL OF CARE SCREENING TOOL     IDENTIFICATION  Patient Name: Tamara [name] Birthdate: 1960-07-15 Sex: female Admission Date (Current Location): 06/14/2016  [facility] and [place] Number:  Herbalist and Address:  [facility], [place] [address], [place], [place] [address]      Provider Number: [id number]  Attending Physician Name and Address:  Annia Belt, MD  Relative Name and Phone Number:       Current Level of Care: Hospital Recommended Level of Care: [place] Prior Approval Number:    Date Approved/Denied:   PASRR Number: [id number]  Discharge Plan: SNF    Current Diagnoses: Patient Active Problem List   Diagnosis Date Noted  . Multiple sclerosis ([place]) 06/14/2016  . Allergic rhinitis 04/12/2016  . Medication overuse headache 03/17/2016  . Elevated blood pressure reading in office without diagnosis of hypertension 03/13/2016  . Otitis media 02/23/2014  . Sciatica associated with disorder of lumbosacral spine 01/15/2014  . Edema 01/15/2014  . Headache(784.0) 07/21/2013  . GERD (gastroesophageal reflux disease) 07/21/2013  . RBBB on EKG 08/01/2012  . IBS (irritable bowel syndrome) 06/19/2012  . Healthcare maintenance 06/19/2012    Orientation RESPIRATION BLADDER Height & Weight     Self, Time, Situation, Place  Normal Continent Weight: 225 lb 1.6 oz (102.1 kg) Height:  5\' 3"  (160 cm)  BEHAVIORAL SYMPTOMS/MOOD NEUROLOGICAL BOWEL NUTRITION STATUS      Continent    AMBULATORY STATUS COMMUNICATION OF NEEDS Skin   Limited Assist Verbally Normal                       Personal Care Assistance Level of Assistance  Bathing, Dressing Bathing Assistance: Maximum assistance   Dressing Assistance: Limited assistance     Functional Limitations Info             SPECIAL CARE FACTORS FREQUENCY  PT (By licensed PT), OT (By licensed OT)     PT Frequency: 5x/wk OT Frequency:  5x/wk            Contractures      Additional Factors Info  Code Status, Allergies, Insulin Sliding Scale Code Status Info: full Allergies Info: Codeine, Dilaudid Hydromorphone Hcl, Paxil Paroxetine Hcl, Adhesive Tape, Augmentin Amoxicillin-pot Clavulanate   Insulin Sliding Scale Info: 3x/day       Current Medications (06/16/2016):  This is the current hospital active medication list Current Facility-Administered Medications  Medication Dose Route Frequency Provider Last Rate Last Dose  . acetaminophen (TYLENOL) tablet 650 mg  650 mg Oral Q6H PRN Asencion Partridge, MD   650 mg at 06/15/16 0120  . enoxaparin (LOVENOX) injection 40 mg  40 mg Subcutaneous Q24H Holley Raring, MD   40 mg at 06/15/16 1734  . insulin aspart (novoLOG) injection 0-5 Units  0-5 Units Subcutaneous QHS Norman Herrlich, MD      . insulin aspart (novoLOG) injection 0-9 Units  0-9 Units Subcutaneous TID WC Norman Herrlich, MD   1 Units at 06/16/16 [phone]  . methylPREDNISolone sodium succinate (SOLU-MEDROL) 500 mg in sodium chloride 0.9 % 50 mL IVPB  500 mg Intravenous Q12H Norman Herrlich, MD   Stopped at 06/16/16 1228  . pantoprazole (PROTONIX) EC tablet 40 mg  40 mg Oral Daily Norman Herrlich, MD   40 mg at 06/16/16 0854  . traMADol (ULTRAM) tablet 50-100 mg  50-100 mg Oral Q6H PRN Milagros Loll, MD   50  mg at 06/16/16 8628     Discharge Medications: Please see discharge summary for a list of discharge medications.  Relevant Imaging Results:  Relevant Lab Results:   Additional Information SS#: [id number]  Geralynn Ochs, LCSW

## 2016-06-16 NOTE — Telephone Encounter (Signed)
She has been admitted 

## 2016-06-17 DIAGNOSIS — R202 Paresthesia of skin: Secondary | ICD-10-CM

## 2016-06-17 DIAGNOSIS — R29818 Other symptoms and signs involving the nervous system: Secondary | ICD-10-CM

## 2016-06-17 LAB — GLUCOSE, CAPILLARY
GLUCOSE-CAPILLARY: 123 mg/dL — AB (ref 65–99)
GLUCOSE-CAPILLARY: 106 mg/dL — AB (ref 65–99)
Glucose-Capillary: 110 mg/dL — ABNORMAL HIGH (ref 65–99)
Glucose-Capillary: 108 mg/dL — ABNORMAL HIGH (ref 65–99)

## 2016-06-17 MED ORDER — PREDNISONE 20 MG PO TABS
60.0000 mg | ORAL_TABLET | Freq: Every day | ORAL | Status: AC
  Administered 2016-06-18 – 2016-06-19 (×2): 60 mg via ORAL
  Filled 2016-06-17 (×2): qty 3

## 2016-06-17 NOTE — Progress Notes (Signed)
   Subjective:  Pt is feeling better today with improvement in grip strength and bowel/bladder control. Sensation still impaired but improving. Excited about CIR rehab. Having some insomnia 2/2 steroids.  Objective:  Vital signs in last 24 hours: Vitals:   06/16/16 2059 06/17/16 0045 06/17/16 0623 06/17/16 0930  BP: 118/67 112/62 125/65 140/78  Pulse: 66 61 (!) 58 70  Resp: 18 18 18 20   Temp: 98 F (36.7 C) 98.2 F (36.8 C) 98.2 F (36.8 C) 98.9 F (37.2 C)  TempSrc: Oral Oral Oral Oral  SpO2: 99% 96% 100% 100%  Weight:      Height:       General Apperance: NAD HEENT: Normocephalic, atraumatic, anicteric sclera Neck: Supple, trachea midline Lungs: Clear to auscultation bilaterally. No wheezes, rhonchi or rales. Breathing comfortably Heart: Regular rate and rhythm, no murmur/rub/gallop Abdomen: Soft, nontender, nondistended, no rebound/guarding Extremities: Warm and well perfused, no edema, still with tenderness to deeper palpation of muscles BLE Skin: No rashes or lesions Neurologic: Alert and interactive. Hyperesthesia resolved. Strength intact BLE. Improvement in grip strength and forearm flexor strength improved. Decreased sensation of hands. Anesthesia T12 to saddle area. CNs intact. No visual disturbance.  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 CSF oligoclonal bands still pending. Neurology following, I appreciate their careful evaluation and recommendation. -- Solu-Medrol 500 mg IV BID day 3 of 3 today -- start Pred taper tomorrow, start w/ 60mg  -- Protonix 40 mg QD for ulcer ppx -- follow CBGs, SSI PRN -- PT/OT eval and treat -- CIR for dispo -- Tylenol / tramadol PRN severe pain -- Continue cymbalta  FEN/GI: Regular diet, replete electrolytes as needed VTE ppx: Lovenox Code status: Full code  Dispo: Anticipated discharge on Monday to CIR pending insurance approval  Holley Raring, MD 06/17/2016,  12:41 PM Pager: [phone]

## 2016-06-17 NOTE — Progress Notes (Signed)
Neurology Progress Note  Subjective: She reports that her numbness and LE discomfort is improving but has not fully resolved. She has noted more feeling on the left side relative to the right. She had a bowel movement last night and notes improved perineal sensation. She continues to feel tightness around her trunk. She is working well with PT, ambulating with a walker. She denies any new symptoms today. She denies any complications from the Solumedrol. The remainder of her 10-point ROS is negative.   She reports that she brought copies of her previous neurologic evaluations from several years ago. Unfortunately these are not in her hard chart so I haven't been able to review these.   Medications reviewed and reconciled.   Pertinent meds: Solumedrol 500 mg q12, day #3/3  Current Meds:   Current Facility-Administered Medications:  .  acetaminophen (TYLENOL) tablet 650 mg, 650 mg, Oral, Q6H PRN, Asencion Partridge, MD, 650 mg at 06/15/16 0120 .  enoxaparin (LOVENOX) injection 40 mg, 40 mg, Subcutaneous, Q24H, Holley Raring, MD, 40 mg at 06/16/16 1717 .  insulin aspart (novoLOG) injection 0-5 Units, 0-5 Units, Subcutaneous, QHS, Hulen Luster, Diana M, MD .  insulin aspart (novoLOG) injection 0-9 Units, 0-9 Units, Subcutaneous, TID WC, Norman Herrlich, MD, 1 Units at 06/16/16 1715 .  methylPREDNISolone sodium succinate (SOLU-MEDROL) 500 mg in sodium chloride 0.9 % 50 mL IVPB, 500 mg, Intravenous, Q12H, Norman Herrlich, MD, Stopped at 06/17/16 [phone] .  pantoprazole (PROTONIX) EC tablet 40 mg, 40 mg, Oral, Daily, Norman Herrlich, MD, 40 mg at 06/16/16 0854 .  traMADol (ULTRAM) tablet 50-100 mg, 50-100 mg, Oral, Q6H PRN, Jacques Earthly T, MD, 50 mg at 06/16/16 2245  Objective:  Temp:  [98 F (36.7 C)-98.6 F (37 C)] 98.2 F (36.8 C) (05/19 0623) Pulse Rate:  [58-69] 58 (05/19 0623) Resp:  [18] 18 (05/19 0623) BP: (112-134)/(60-67) 125/65 (05/19 0623) SpO2:  [96 %-100 %] 100 % (05/19 7867)  General: WD  obese AA woman resting comfortably in bed in NAD. Alert, oriented x4. Speech is clear without dysarthria. Affect is bright. Comportment is normal.  HEENT: Neck is supple without lymphadenopathy. Mucous membranes are moist and the oropharynx is clear. Sclerae are anicteric. There is no conjunctival injection.  CV: Regular, no murmur. Carotid pulses are 2+ and symmetric with no bruits. Distal pulses 2+ and symmetric.  Lungs: CTAB  Extremities: No C/C/E. Neuro: MS: As noted above.  CN: Pupils are equal and reactive from 3-->2 mm bilaterally. EOMI, no nystagmus. Facial sensation is intact to light touch. Face is symmetric at rest with normal strength and mobility. Hearing is intact to conversational voice. Voice is normal in tone and quality. Palate elevates symmetrically. Uvula is midline. Bilateral SCM and trapezii are 5/5. Tongue is midline with normal bulk and mobility.  Motor: Normal bulk, tone, and strength throughout. No pronator drift. No tremor or other abnormal movements are observed.  Sensation: Intact to light touch. She has patchy decrease in pinprick below T3, with greater numbness reported in the LLE. Joint position is severely impaired in both feet.  DTRs: 3+, symmetric. Toes are downgoing bilaterally. No pathological reflexes.  Coordination: Finger-to-nose is without dysmetria bilaterally.  Gait: Deferred at this time.   Labs: Lab Results  Component Value Date   WBC 10.6 (H) 06/14/2016   HGB 11.7 (L) 06/14/2016   HCT 36.9 06/14/2016   PLT 199 06/14/2016   GLUCOSE 111 (H) 06/14/2016   ALT 10 07/31/2012   AST 11 07/31/2012  NA 139 06/14/2016   K 3.7 06/14/2016   CL 106 06/14/2016   CREATININE 0.92 06/14/2016   BUN 13 06/14/2016   CO2 26 06/14/2016   TSH 0.351 06/15/2016   INR 1.0 07/24/2008   CBC Latest Ref Rng & Units 06/14/2016 06/10/2016 03/17/2016  WBC 4.0 - 10.5 K/uL 10.6(H) 6.4 5.3  Hemoglobin 12.0 - 15.0 g/dL 11.7(L) 11.6(L) 12.1  Hematocrit 36.0 - 46.0 % 36.9 36.6  37.9  Platelets 150 - 400 K/uL 199 196 180    No results found for: HGBA1C Lab Results  Component Value Date   ALT 10 07/31/2012   AST 11 07/31/2012   ALKPHOS 79 07/31/2012   BILITOT 0.1 (L) 07/31/2012   TSH 0.351 RPR nonreactive  B12 2209 ANA negative NMO antibodies negative CSF wbc 0-->2 CSF rbc 12-->0 CSF protein 42 CSF glucose 60 CSF gs/cx negative CSF VDRL nonreactive CSF MS panel pending CSF VZV PCR negative CSF VZV IgG/IgM pending  Radiology:  There is no new neuroimaging for review.   A/P:   1. Transverse myelitis: This is acute, etiology remains unclear. MS is a possibility but the lesions on her brain scan are atypical for MS plaques for the most part. CSF is completely benign with no pleocytosis and normal protein. Awaiting MS panel with IgG index and OCBs. NMO antibodies are negative. ANA is negative, arguing against SLE or other connective tissue disorders. B12, RPR, and TSH are all normal. CSF VZV PCR is negative, awaiting CSF VZV IgG and IgM. Will add serum ACE level. She is improving symptomatically with IV Solumedrol and will complete this today. I would recommend that she have a prednisone taper after her Solumedrol, starting with 60 mg and tapering over 10 days. She should follow up with outpatient neurology and I would suggest Dr. Arlice Colt as he is an MS specialist.   2. BLE numbness: This is acute, due to transverse myelitis. This has improved with Solumedrol. Continue PT/rehab, appreciate their assistance.   3. BUE numbness: This is acute, due to transverse myelitis, also improving with Solumedrol. Continue OT/rehab, appreciate their assistance.   4. Abnormality of gait: This is acute, due to transverse myelitis. Continue PT/rehab.   Needs outpatient neurology follow-up?: Yes. Recommend outpatient f/u with Dr. Arlice Colt once she has complete rehab.   This was discussed with the patient. Education was provided on the diagnosis and expected  evaluation and treatment. They are in agreement with the plan as noted. They were given the opportunity to ask any questions and these were addressed to their satisfaction.    Melba Coon, MD [facility]

## 2016-06-17 NOTE — Progress Notes (Signed)
Subjective: Pt feels better today. She reports increased grip strength and reduced sensory loss in hands, along with continued reduction in LE pain. She is still voiding independently. She had a BM yesterday, and was able to feel it coming on, but was not able to pass it until she received stool softener. She continues to be highly motivated to recover, and is agreeable to CIR.   Objective: Vital signs in last 24 hours: Vitals:   06/16/16 2059 06/17/16 0045 06/17/16 0623 06/17/16 0930  BP: 118/67 112/62 125/65 140/78  Pulse: 66 61 (!) 58 70  Resp: '18 18 18 20  ' Temp: 98 F (36.7 C) 98.2 F (36.8 C) 98.2 F (36.8 C) 98.9 F (37.2 C)  TempSrc: Oral Oral Oral Oral  SpO2: 99% 96% 100% 100%  Weight:      Height:       Weight change:  No intake or output data in the 24 hours ending 06/17/16 1024 BP 140/78 (BP Location: Left Arm)   Pulse 70   Temp 98.9 F (37.2 C) (Oral)   Resp 20   Ht '5\' 3"'  (1.6 m)   Wt 102.1 kg (225 lb 1.6 oz)   SpO2 100%   BMI 39.87 kg/m   General Appearance:    Alert, cooperative, no distress, appears stated age  Head:    Normocephalic, without obvious abnormality, atraumatic  Lungs:     Clear to auscultation bilaterally, respirations unlabored  Chest Wall:    No tenderness or deformity   Heart:    Regular rate and rhythm, S1 and S2 normal, no murmur, rub   or gallop  Extremities:   Extremities normal, atraumatic, no cyanosis or edema.   Pulses:   2+ and symmetric all extremities  Skin:   Skin color, texture, turgor normal, no rashes or lesions  Neurologic:   Alert and interactive. Strength intact BLE, grip strength much improved. Increased sensation in hands. No visual disturbance.    Lab Results:  Cap glucose:  5/19 06:12 - 110 5/18 21:00 - 130 5/18 16:57 - 138  Medications:  Scheduled Meds: . enoxaparin (LOVENOX) injection  40 mg Subcutaneous Q24H  . insulin aspart  0-5 Units Subcutaneous QHS  . insulin aspart  0-9 Units Subcutaneous TID WC  .  pantoprazole  40 mg Oral Daily   Continuous Infusions: . methylPREDNISolone (SOLU-MEDROL) injection Stopped (06/17/16 0053)   PRN Meds:.acetaminophen, traMADol    Assessment/Plan:  Transverse myelitis of unknown etiology: 06/14/16 MRI demonstrated multiple white matter lesions throughout the cortex, along with two discrete new C spine lesions. MS remains on the differential, with CSF culture, varicella-zoster PCR, and VDRL all negative; awaiting results of banding. Pt is now on day 3/3 of steroids with very good response; grip strength and hand sensation have improved from yesterday. Pt denies incontinence at present and has been voiding independently; foley not indicated at this time. She is amenable to inpatient PT and highly motivated to work on regaining mobility. She met with PT and OT yesterday, and both recommended CIR; she then was accepted to and met with CIR. Insurance approval process will be initiated on Monday; if CIR is not approved by insurance, pt would prefer home health to SNF.  -Ulcer prophylaxis: Protonix 40 mg QD  -Pain regimen: Tylenol + tramadol PRN -Continue cymbalta -Submit insurance application for discharge to CIR; consider home health if not approved.  FEN/GI:Regular diet, replete electrolytes as needed VTE ppx: Lovenox Code status: Full code Dispo: Anticipated discharge to CIR pending  insurance approval.   This is a Careers information officer Note.  The care of the patient was discussed with Dr. Holley Raring and the assessment and plan formulated with their assistance.  Please see their attached note for official documentation of the daily encounter.   LOS: 2 days   Jeri Cos, Medical Student 06/17/2016, 10:24 AM

## 2016-06-18 LAB — GLUCOSE, CAPILLARY
GLUCOSE-CAPILLARY: 85 mg/dL (ref 65–99)
GLUCOSE-CAPILLARY: 109 mg/dL — AB (ref 65–99)
GLUCOSE-CAPILLARY: 103 mg/dL — AB (ref 65–99)
Glucose-Capillary: 97 mg/dL (ref 65–99)

## 2016-06-18 LAB — CSF CULTURE: CULTURE: NO GROWTH

## 2016-06-18 MED ORDER — DOCUSATE SODIUM 100 MG PO CAPS: 100.0000 mg | ORAL_CAPSULE | Freq: Two times a day (BID) | ORAL | Status: DC | PRN

## 2016-06-18 MED ORDER — DOCUSATE SODIUM 100 MG PO CAPS
100.0000 mg | ORAL_CAPSULE | Freq: Every day | ORAL | Status: DC
  Administered 2016-06-18 – 2016-06-19 (×2): 100 mg via ORAL
  Filled 2016-06-18 (×2): qty 1

## 2016-06-18 NOTE — PMR Pre-admission (Signed)
PMR Admission Coordinator Pre-Admission Assessment  Patient: Tamara [name] is an 56 y.o., female MRN: [id number] DOB: 1961/01/04 Height: 5\' 3"  (160 cm) Weight: 102.1 kg (225 lb 1.6 oz)              Insurance Information HMO:     PPO: X     PCP:      IPA:      80/20:      OTHER: [employer]  PRIMARY: BCBS      Policy#: [id number]      Subscriber: Self CM Name: Dahlia Bailiff    Phone#:  [phone]     Fax#:  [phone] Pre-Cert#:  [id number], approved for 10 days thru 06/28/16 with clinical updates due 06/28/16      Employer: self employed Benefits:  Phone #: [phone]     Name: verified online Eff. Date: 01/31/16     Deduct: $400      Out of Pocket Max: $800(included deductible, co-pay, & co-insurance       Life Max: None CIR: 70%/30%      SNF: 70%/30% Outpatient: PT/OT     Co-Pay: $20 per visit  Home Health: 70%      Co-Pay: 30% DME: 70%     Co-Pay: 30% Providers: in network  Medicaid Application Date:       Case Manager:  Disability Application Date:       Case Worker:   Emergency Facilities manager Information    Name Relation Home Work Mobile   Cici, Rodriges [phone]  [phone]     Current Medical History  Patient Admitting Diagnosis:Gait deficits,sensory loss related to demyelinating disease of brain and cervico-thoracic cord   History of Present Illness:  Self employed      Past Medical History  Past Medical History:  Diagnosis Date  . Anemia   . Bowel obstruction ([place])   . GERD (gastroesophageal reflux disease)   . IBS (irritable bowel syndrome)    at age of 22  . Sciatica 2009    Family History  family history includes Cancer in her father; Gallbladder disease in her father, paternal grandmother, and sister; Hypertension in her brother.  Prior Rehab/Hospitalizations:  Has the patient had major surgery during 100 days prior to admission? No  Current Medications   Current Facility-Administered Medications:  .  acetaminophen  (TYLENOL) tablet 650 mg, 650 mg, Oral, Q6H PRN, Asencion Partridge, MD, 650 mg at 06/15/16 0120 .  docusate sodium (COLACE) capsule 100 mg, 100 mg, Oral, Daily, Annia Belt, MD, 100 mg at 06/18/16 0957 .  docusate sodium (COLACE) capsule 100 mg, 100 mg, Oral, BID PRN, Shela Leff, MD .  enoxaparin (LOVENOX) injection 40 mg, 40 mg, Subcutaneous, Q24H, Holley Raring, MD, 40 mg at 06/17/16 1721 .  insulin aspart (novoLOG) injection 0-5 Units, 0-5 Units, Subcutaneous, QHS, Hulen Luster, Diana M, MD .  insulin aspart (novoLOG) injection 0-9 Units, 0-9 Units, Subcutaneous, TID WC, Norman Herrlich, MD, 1 Units at 06/17/16 1700 .  pantoprazole (PROTONIX) EC tablet 40 mg, 40 mg, Oral, Daily, Norman Herrlich, MD, 40 mg at 06/18/16 0957 .  predniSONE (DELTASONE) tablet 60 mg, 60 mg, Oral, Q breakfast, Shela Leff, MD, 60 mg at 06/18/16 0856 .  traMADol (ULTRAM) tablet 50-100 mg, 50-100 mg, Oral, Q6H PRN, Milagros Loll, MD, 50 mg at 06/18/16 1102  Patients Current Diet: Diet regular Room service appropriate? Yes; Fluid consistency: Thin  Precautions / Restrictions Precautions Precautions: Fall Restrictions Weight Bearing Restrictions: No  Has the patient had 2 or more falls or a fall with injury in the past year?No, 1 fall that patient reported as a trip that resulted in no injuries   Prior Activity Level Community (5-7x/wk): Patient with multiple medical issues in her past; however, just prior to this she was fully independent with increased time for rest breaks and was caring for her spouse.  Over the last couple months she has noticed a slow decline that lead to this hospitalization.   Home Assistive Devices / Equipment Home Assistive Devices/Equipment: None Home Equipment: Hand held shower head, Grab bars - tub/shower  Prior Device Use: Indicate devices/aids used by the patient prior to current illness, exacerbation or injury? None of the above  Prior Functional Level Prior  Function Level of Independence: Independent Comments: pt states that she cares for her husband and babysits her granddaughter every day. She enjoys painting and decorating cakes  Self Care: Did the patient need help bathing, dressing, using the toilet or eating? Independent  Indoor Mobility: Did the patient need assistance with walking from room to room (with or without device)? Independent  Stairs: Did the patient need assistance with internal or external stairs (with or without device)? Independent, but only did them about once a month to conserve energy  Functional Cognition: Did the patient need help planning regular tasks such as shopping or remembering to take medications? Independent  Current Functional Level Cognition  Overall Cognitive Status: Within Functional Limits for tasks assessed Orientation Level: Oriented X4    Extremity Assessment (includes Sensation/Coordination)  Upper Extremity Assessment: RUE deficits/detail, LUE deficits/detail RUE Deficits / Details: Cannot withstand any resitance in MMT, able to move against gravity. Able to make a full fist, and full digit extension, but strength is 3 Pt requires built up handles to use grooming tools, Pt able to perform finger to thumb with increased time, only one hand at a time. RUE Sensation: decreased light touch (from wrist down) RUE Coordination: decreased fine motor, decreased gross motor LUE Deficits / Details: Cannot withstand any resitance in MMT, able to move against gravity. Able to make a full fist, and full digit extension, but strength overall a 3 Pt requires built up handles to use grooming tools, Pt able to perform finger to thumb with increased time, only one hand at a time,  LUE Sensation: decreased light touch LUE Coordination: decreased fine motor, decreased gross motor  Lower Extremity Assessment: Defer to PT evaluation RLE Sensation: decreased light touch RLE Coordination: decreased fine motor, decreased  gross motor LLE Sensation: decreased light touch LLE Coordination: decreased fine motor, decreased gross motor    ADLs  Overall ADL's : Needs assistance/impaired Eating/Feeding: Set up, With adaptive utensils, Cueing for compensatory techinques, Sitting Grooming: Wash/dry hands, Sitting, Set up Grooming Details (indicate cue type and reason): used wash cloth to build up handles of toothbrush for oral care. Pt require BUE to perform. Pt required assist to maintain control of washcloth during face washing. Upper Body Bathing: Sitting, Minimal assistance Upper Body Bathing Details (indicate cue type and reason): Pt unable to maintain grip on washcloth during UB bathing, she required assist for her back Lower Body Bathing: Moderate assistance, Sitting/lateral leans, With adaptive equipment Lower Body Bathing Details (indicate cue type and reason): BLE very sensative to touch, Pt's face visually grimacing during cleaning. Pt able to get thighs, but required assist for the knees down Upper Body Dressing : Set up, Sitting Lower Body Dressing: Sit to/from stand, Moderate assistance, Maximal assistance  Lower Body Dressing Details (indicate cue type and reason): max a to don socks sitting in chair Toilet Transfer: Minimal assistance, Ambulation, RW, BSC (over toilet) Toilet Transfer Details (indicate cue type and reason): cues for hand placement Toileting- Clothing Manipulation and Hygiene: Min guard, Sit to/from stand Functional mobility during ADLs: Minimal assistance, Rolling walker General ADL Comments: pt with improved UB strength with ability to guide walker without assist and use effectively for pushing up from chair and BSC as well as to wash her hands     Mobility  Overal bed mobility: Needs Assistance Bed Mobility: Supine to Sit Supine to sit: Min guard Sit to supine: Min guard General bed mobility comments: pt in chair    Transfers  Overall transfer level: Needs assistance Equipment  used: Rolling walker (2 wheeled) Transfers: Sit to/from Stand Sit to Stand: Min assist Stand pivot transfers: Mod assist, +2 safety/equipment General transfer comment: cues for hand placement, steadying assist    Ambulation / Gait / Stairs / Wheelchair Mobility  Ambulation/Gait Ambulation/Gait assistance: Min assist, +2 physical assistance, Mod assist, +2 safety/equipment Ambulation Distance (Feet): 100 Feet Assistive device: Rolling walker (2 wheeled) Gait Pattern/deviations: Step-through pattern General Gait Details: pt unable to coordinate gait L LE or R LE.  Characterized by varying step lengths, heel/toe or foot flat contact.  Pt tended toward narrowed BOS when unable to look at her feet.  Very little knee buckling and pt was strong enough to overcome a gross misstep. Gait velocity interpretation: Below normal speed for age/gender    Posture / Balance Dynamic Sitting Balance Sitting balance - Comments: worked on unsupported sitting with reaching and w/shifting--adding challenge to sitting Balance Overall balance assessment: Needs assistance Sitting-balance support: Feet supported Sitting balance-Leahy Scale: Good Sitting balance - Comments: worked on unsupported sitting with reaching and w/shifting--adding challenge to sitting Standing balance support: During functional activity, Bilateral upper extremity supported Standing balance-Leahy Scale: Poor Standing balance comment: reliant on walker for ambulation    Special needs/care consideration BiPAP/CPAP: No CPM: No Continuous Drip IV: No Dialysis: No         Life Vest: No Oxygen: No Special Bed: No Trach Size: No Wound Vac (area): No       Skin: Per patient report she bruises easily; WDL                               Bowel mgmt: 06/17/16 patient now unable to feel when she needs to go and just happens to be continent with BM when she sits to urinate Bladder mgmt: Continent, because she now just feels pressure with urgency,  and difficulty emptying per her report  Diabetic mgmt: No     Previous Home Environment Living Arrangements: Spouse/significant other Available Help at Discharge: Family, Available 24 hours/day Type of Home: House Home Layout: Two level, Able to live on main level with bedroom/bathroom Alternate Level Stairs-Rails: Right Alternate Level Stairs-Number of Steps: flight Home Access: Stairs to enter Entrance Stairs-Rails: None Entrance Stairs-Number of Steps: 4 Bathroom Shower/Tub: Multimedia programmer: Handicapped height Bathroom Accessibility: Yes How Accessible: Accessible via wheelchair [place]: No Additional Comments: husband is disabled (heart disease), Pt cares for her 68 year old grandaughter  Discharge Living Setting Plans for Discharge Living Setting: Patient's home, Lives with (comment) (Spouse ) Type of Home at Discharge: House Discharge Home Layout: Two level, Able to live on main level with bedroom/bathroom Alternate Level Stairs-Rails: Right  Alternate Level Stairs-Number of Steps: 16-18 Discharge Home Access: Stairs to enter Entrance Stairs-Rails: None Entrance Stairs-Number of Steps: 4 Discharge Bathroom Shower/Tub: Walk-in shower Discharge Bathroom Toilet: Handicapped height Discharge Bathroom Accessibility: Yes How Accessible: Accessible via wheelchair, Accessible via walker Does the patient have any problems obtaining your medications?: No  Social/Family/Support Systems Patient Roles: Spouse, Parent Contact Information: Spouse: Azriel Dancy [phone] (cell) spouse disabled Anticipated Caregiver: Daughters can assist intermittently Anticipated Caregiver's Contact Information: Dtr: Verner Mould [phone] Ability/Limitations of Caregiver: Husband disabled and daughters intermittent  Caregiver Availability: Intermittent Discharge Plan Discussed with Primary Caregiver: Yes Is Caregiver In Agreement with Plan?: Yes Does Caregiver/Family  have Issues with Lodging/Transportation while Pt is in Rehab?: No  Goals/Additional Needs Patient/Family Goal for Rehab: PT/OT Mod I  Expected length of stay: 17-20 days  Cultural Considerations: Christian  Dietary Needs: Regular textures and thin liquids  Equipment Needs: TBD Special Service Needs: None Additional Information: None Pt/Family Agrees to Admission and willing to participate: Yes Program Orientation Provided & Reviewed with Pt/Caregiver Including Roles  & Responsibilities: Yes Additional Information Needs: None Information Needs to be Provided By: N/A   Decrease burden of Care through IP rehab admission: No   Possible need for SNF placement upon discharge: No   Patient Condition: This patient's medical and functional status has changed since the consult dated: 06/16/16 in which the Rehabilitation Physician determined and documented that the patient's condition is appropriate for intensive rehabilitative care in an inpatient rehabilitation facility. See "History of Present Illness" (above) for medical update. Functional changes are: transfers with min guard assist; ambulates 100' min assist with RW ; sensory deficits persist; min guard for grooming and ADLs during functional mobility . Patient's medical and functional status update has been discussed with the Rehabilitation physician and patient remains appropriate for inpatient rehabilitation. Will admit to inpatient rehab today.  Preadmission Screen Completed By:  Gunnar Fusi, with brief updates by Gerlean Ren  06/18/2016 1:14 PM ______________________________________________________________________   Discussed status with Dr. Posey Pronto on 06/19/16 at  1504  and received telephone approval for admission today.  Admission Coordinator:  Gunnar Fusi, time 1504 Sudie Grumbling 06/19/16

## 2016-06-18 NOTE — Progress Notes (Signed)
Neurology Progress Note  Subjective: She has completed three days of IV Solumedrol. She continues to have some slight gradual improvement in her numbness and dysesthesias. She is now able to walk more, though still has pain in her feet when standing. She is looking forward to going to rehab soon. She is focused on keeping a positive frame of mind as she says that this helps her to control her pain. The remainder of her 10-point ROS is negative.   Medications reviewed and reconciled.   Pertinent meds: Prednisone 60 mg daily  Current Meds:   Current Facility-Administered Medications:  .  acetaminophen (TYLENOL) tablet 650 mg, 650 mg, Oral, Q6H PRN, Asencion Partridge, MD, 650 mg at 06/15/16 0120 .  docusate sodium (COLACE) capsule 100 mg, 100 mg, Oral, Daily, Annia Belt, MD .  enoxaparin (LOVENOX) injection 40 mg, 40 mg, Subcutaneous, Q24H, Holley Raring, MD, 40 mg at 06/17/16 1721 .  insulin aspart (novoLOG) injection 0-5 Units, 0-5 Units, Subcutaneous, QHS, Hulen Luster, Diana M, MD .  insulin aspart (novoLOG) injection 0-9 Units, 0-9 Units, Subcutaneous, TID WC, Norman Herrlich, MD, 1 Units at 06/17/16 1700 .  pantoprazole (PROTONIX) EC tablet 40 mg, 40 mg, Oral, Daily, Norman Herrlich, MD, 40 mg at 06/17/16 0910 .  predniSONE (DELTASONE) tablet 60 mg, 60 mg, Oral, Q breakfast, Shela Leff, MD .  traMADol (ULTRAM) tablet 50-100 mg, 50-100 mg, Oral, Q6H PRN, Milagros Loll, MD, 50 mg at 06/18/16 0026  Objective:  Temp:  [98.6 F (37 C)-98.9 F (37.2 C)] 98.6 F (37 C) (05/20 0124) Pulse Rate:  [61-70] 65 (05/20 0540) Resp:  [16-20] 18 (05/20 0540) BP: (138-168)/(76-93) 159/93 (05/20 0540) SpO2:  [96 %-100 %] 100 % (05/20 0540)  General: WD obese AA woman resting comfortably in bed in NAD. Alert, oriented x4. Speech is clear without dysarthria. Affect is bright. Comportment is normal.  HEENT: Neck is supple without lymphadenopathy. Mucous membranes are moist and the  oropharynx is clear. Sclerae are anicteric. There is no conjunctival injection.  CV: Regular, no murmur. Carotid pulses are 2+ and symmetric with no bruits. Distal pulses 2+ and symmetric.  Lungs: CTAB  Extremities: No C/C/E. Neuro: MS: As noted above.  CN: Pupils are equal and reactive from 3-->2 mm bilaterally. EOMI, no nystagmus. Facial sensation is intact to light touch. Face is symmetric at rest with normal strength and mobility. Hearing is intact to conversational voice. Voice is normal in tone and quality. Palate elevates symmetrically. Uvula is midline. Bilateral SCM and trapezii are 5/5. Tongue is midline with normal bulk and mobility.  Motor: Normal bulk, tone, and strength throughout. No pronator drift. No tremor or other abnormal movements are observed.  Sensation: Intact to light touch. Still with patchy decrease in pinprick in BLE.  DTRs: 3+, symmetric. Toes are downgoing bilaterally. No pathological reflexes.  Coordination: Finger-to-nose is without dysmetria bilaterally.  Gait: Deferred at this time.   Labs: Lab Results  Component Value Date   WBC 10.6 (H) 06/14/2016   HGB 11.7 (L) 06/14/2016   HCT 36.9 06/14/2016   PLT 199 06/14/2016   GLUCOSE 111 (H) 06/14/2016   ALT 10 07/31/2012   AST 11 07/31/2012   NA 139 06/14/2016   K 3.7 06/14/2016   CL 106 06/14/2016   CREATININE 0.92 06/14/2016   BUN 13 06/14/2016   CO2 26 06/14/2016   TSH 0.351 06/15/2016   INR 1.0 07/24/2008   CBC Latest Ref Rng & Units 06/14/2016 06/10/2016 03/17/2016  WBC 4.0 - 10.5 K/uL 10.6(H) 6.4 5.3  Hemoglobin 12.0 - 15.0 g/dL 11.7(L) 11.6(L) 12.1  Hematocrit 36.0 - 46.0 % 36.9 36.6 37.9  Platelets 150 - 400 K/uL 199 196 180    No results found for: HGBA1C Lab Results  Component Value Date   ALT 10 07/31/2012   AST 11 07/31/2012   ALKPHOS 79 07/31/2012   BILITOT 0.1 (L) 07/31/2012   TSH 0.351 RPR nonreactive  B12 2209 ANA negative NMO antibodies negative  CSF wbc 0-->2 CSF rbc  12-->0 CSF protein 42 CSF glucose 60 CSF gs/cx negative CSF VDRL nonreactive CSF VZV PCR negative   Pending studies: CSF VZV IgG/IgM CSF MS panel  CSF ACE Serum ACE  Radiology:  There is no new neuroimaging for review.   A/P:   1. Transverse myelitis: This is acute, etiology remains unclear. MS is a possibility but the lesions on her brain scan are atypical for MS plaques for the most part. Thus far CSF and serum studies are unremarkable and do not offer a specific diagnosis. Awaiting MS panel, CSF VZV titers, CSF and serum ACE. She has improved with IV steroids, prednisone taper to start today. She should follow up with outpatient neurology and I would suggest Dr. Arlice Colt as he is an MS specialist.   2. BLE numbness: This is acute, due to transverse myelitis. This has improved with Solumedrol. Continue PT/rehab, appreciate their assistance.   3. BUE numbness: This is acute, due to transverse myelitis, also improving with Solumedrol. Continue OT/rehab, appreciate their assistance.   4. Abnormality of gait: This is acute, due to transverse myelitis. Continue PT/rehab.   Needs outpatient neurology follow-up?: Yes. Recommend outpatient f/u with Dr. Arlice Colt once she has complete rehab.   This was discussed with the patient. Education was provided on the diagnosis and expected evaluation and treatment. They are in agreement with the plan as noted. They were given the opportunity to ask any questions and these were addressed to their satisfaction.   I have no additional recommendations at this time and will sign off.    Tamara Coon, MD [facility]

## 2016-06-18 NOTE — Plan of Care (Signed)
Problem: Safety: Goal: Ability to remain free from injury will improve Outcome: Progressing Pt verbalizes understanding the importance of calling before getting up. Pt has continue to call before attempting to get out of bed.   Problem: Pain Managment: Goal: General experience of comfort will improve Outcome: Progressing Pt still experiencing pain. Will continue to administer PRN pain medication as pt request per MD order.   Problem: Activity: Goal: Risk for activity intolerance will decrease Outcome: Progressing Pt has verbalized understanding that she will need rehab in order to improve strength and endurance.

## 2016-06-18 NOTE — Progress Notes (Signed)
   Subjective:  Pt is feeling better today with improvement in grip strength and bowel/bladder control. Sensation still impaired but improving. Excited about CIR rehab. Having some insomnia 2/2 steroids.  Objective:  Vital signs in last 24 hours: Vitals:   06/17/16 1737 06/17/16 2149 06/18/16 0124 06/18/16 0540  BP: 138/79 (!) 168/83 140/76 (!) 159/93  Pulse: 63 61 64 65  Resp: 16 17 18 18   Temp: 98.6 F (37 C) 98.8 F (37.1 C) 98.6 F (37 C)   TempSrc: Oral Oral Oral Oral  SpO2: 97% 98% 96% 100%  Weight:      Height:       General Apperance: NAD HEENT: Normocephalic, atraumatic, anicteric sclera Lungs: Clear to auscultation bilaterally. No wheezes, rhonchi or rales. Breathing comfortably on RA.  Heart: Regular rate and rhythm, no murmur/rub/gallop Abdomen: Soft, nontender, nondistended, no rebound/guarding Extremities: Warm and well perfused, no edema Skin: No rashes or lesions Neurologic: Alert and interactive. CN II-XII grossly intact. Hyperesthesia in feet bilaterally. Strength intact BLE. Grip strength and forearm flexor strength 5/5 bilaterally. Decreased sensation of hands. Anesthesia T12 to saddle area. No visual disturbance.  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 S/p solu-medrol 500 mg IV BID x 3 days. CSF oligoclonal bands still pending. Neurology following, we appreciate their careful evaluation and recommendation. -- start 10 day Pred taper today, start w/ 60mg  -- Protonix 40 mg QD for ulcer ppx -- follow CBGs, SSI PRN -- PT/OT eval and treat -- CIR for dispo -- Tylenol / tramadol PRN severe pain -- Continue cymbalta  FEN/GI: Regular diet, replete electrolytes as needed VTE ppx: Lovenox Code status: Full code  Dispo: Anticipated discharge on Monday to CIR pending insurance approval  Holley Raring, MD 06/18/2016, 7:19 AM Pager: [phone]

## 2016-06-19 ENCOUNTER — Inpatient Hospital Stay (HOSPITAL_COMMUNITY)
Admission: RE | Admit: 2016-06-19 | Discharge: 2016-06-28 | DRG: 092 | Disposition: A | Discharge status: Home / Self Care | Payer: BLUE CROSS/BLUE SHIELD | Source: Intra-hospital | Attending: Physical Medicine & Rehabilitation | Admitting: Physical Medicine & Rehabilitation

## 2016-06-19 DIAGNOSIS — T421X5A Adverse effect of iminostilbenes, initial encounter: Secondary | ICD-10-CM | POA: Diagnosis not present

## 2016-06-19 DIAGNOSIS — D649 Anemia, unspecified: Secondary | ICD-10-CM | POA: Diagnosis present

## 2016-06-19 DIAGNOSIS — D72829 Elevated white blood cell count, unspecified: Secondary | ICD-10-CM | POA: Diagnosis present

## 2016-06-19 DIAGNOSIS — Z885 Allergy status to narcotic agent status: Secondary | ICD-10-CM | POA: Diagnosis not present

## 2016-06-19 DIAGNOSIS — K589 Irritable bowel syndrome without diarrhea: Secondary | ICD-10-CM | POA: Diagnosis not present

## 2016-06-19 DIAGNOSIS — B370 Candidal stomatitis: Secondary | ICD-10-CM | POA: Diagnosis not present

## 2016-06-19 DIAGNOSIS — R2689 Other abnormalities of gait and mobility: Secondary | ICD-10-CM | POA: Diagnosis not present

## 2016-06-19 DIAGNOSIS — R739 Hyperglycemia, unspecified: Secondary | ICD-10-CM | POA: Diagnosis present

## 2016-06-19 DIAGNOSIS — K592 Neurogenic bowel, not elsewhere classified: Secondary | ICD-10-CM

## 2016-06-19 DIAGNOSIS — G379 Demyelinating disease of central nervous system, unspecified: Secondary | ICD-10-CM | POA: Diagnosis not present

## 2016-06-19 DIAGNOSIS — D638 Anemia in other chronic diseases classified elsewhere: Secondary | ICD-10-CM

## 2016-06-19 DIAGNOSIS — Z881 Allergy status to other antibiotic agents status: Secondary | ICD-10-CM | POA: Diagnosis not present

## 2016-06-19 DIAGNOSIS — K219 Gastro-esophageal reflux disease without esophagitis: Secondary | ICD-10-CM | POA: Diagnosis present

## 2016-06-19 DIAGNOSIS — G35 Multiple sclerosis: Secondary | ICD-10-CM | POA: Diagnosis not present

## 2016-06-19 DIAGNOSIS — T380X5A Adverse effect of glucocorticoids and synthetic analogues, initial encounter: Secondary | ICD-10-CM | POA: Diagnosis present

## 2016-06-19 DIAGNOSIS — G600 Hereditary motor and sensory neuropathy: Secondary | ICD-10-CM

## 2016-06-19 DIAGNOSIS — M792 Neuralgia and neuritis, unspecified: Secondary | ICD-10-CM | POA: Diagnosis not present

## 2016-06-19 DIAGNOSIS — Z9079 Acquired absence of other genital organ(s): Secondary | ICD-10-CM

## 2016-06-19 DIAGNOSIS — E8809 Other disorders of plasma-protein metabolism, not elsewhere classified: Secondary | ICD-10-CM | POA: Diagnosis not present

## 2016-06-19 DIAGNOSIS — N319 Neuromuscular dysfunction of bladder, unspecified: Secondary | ICD-10-CM | POA: Diagnosis present

## 2016-06-19 DIAGNOSIS — R269 Unspecified abnormalities of gait and mobility: Secondary | ICD-10-CM | POA: Diagnosis not present

## 2016-06-19 DIAGNOSIS — E559 Vitamin D deficiency, unspecified: Secondary | ICD-10-CM | POA: Diagnosis present

## 2016-06-19 DIAGNOSIS — R339 Retention of urine, unspecified: Secondary | ICD-10-CM | POA: Diagnosis present

## 2016-06-19 DIAGNOSIS — G373 Acute transverse myelitis in demyelinating disease of central nervous system: Secondary | ICD-10-CM

## 2016-06-19 DIAGNOSIS — R251 Tremor, unspecified: Secondary | ICD-10-CM | POA: Diagnosis not present

## 2016-06-19 DIAGNOSIS — G4700 Insomnia, unspecified: Secondary | ICD-10-CM | POA: Diagnosis present

## 2016-06-19 DIAGNOSIS — Z90721 Acquired absence of ovaries, unilateral: Secondary | ICD-10-CM | POA: Diagnosis not present

## 2016-06-19 DIAGNOSIS — R609 Edema, unspecified: Secondary | ICD-10-CM | POA: Diagnosis not present

## 2016-06-19 DIAGNOSIS — Z79899 Other long term (current) drug therapy: Secondary | ICD-10-CM | POA: Diagnosis not present

## 2016-06-19 DIAGNOSIS — Z91048 Other nonmedicinal substance allergy status: Secondary | ICD-10-CM

## 2016-06-19 DIAGNOSIS — R208 Other disturbances of skin sensation: Secondary | ICD-10-CM | POA: Diagnosis not present

## 2016-06-19 LAB — MS PROFILE+MBP, CSF + BLOOD
Albumin CSF-mCnc: 26 mg/dL (ref 11–48)
Albumin: 4.2 g/dL (ref 3.5–5.5)
CSF IgG Index: 0.9 — ABNORMAL HIGH (ref 0.0–0.7)
CSF/Serum Alb. Index: 6 (ref 0–8)
IgG (Immunoglobin G), Serum: 749 mg/dL (ref 700–1600)
IgG Synthetic Rate: 7.4 mg/d — ABNORMAL HIGH
IgG, CSF: 4.1 mg/dL (ref 0.0–8.6)
IgG/Alb Ratio, CSF: 0.16 (ref 0.00–0.25)
Myelin Basic Protein: 2.2 ng/mL — ABNORMAL HIGH (ref 0.0–1.2)

## 2016-06-19 LAB — GLUCOSE, CAPILLARY
GLUCOSE-CAPILLARY: 80 mg/dL (ref 65–99)
Glucose-Capillary: 99 mg/dL (ref 65–99)
Glucose-Capillary: 108 mg/dL — ABNORMAL HIGH (ref 65–99)

## 2016-06-19 LAB — CBC
HCT: 38.9 % (ref 36.0–46.0)
Hemoglobin: 12.5 g/dL (ref 12.0–15.0)
MCH: 28 pg (ref 26.0–34.0)
MCHC: 32.1 g/dL (ref 30.0–36.0)
MCV: 87 fL (ref 78.0–100.0)
PLATELETS: 199 10*3/uL (ref 150–400)
RBC: 4.47 MIL/uL (ref 3.87–5.11)
RDW: 14.7 % (ref 11.5–15.5)
WBC: 12.6 10*3/uL — ABNORMAL HIGH (ref 4.0–10.5)

## 2016-06-19 LAB — CREATININE, SERUM
Creatinine, Ser: 0.87 mg/dL (ref 0.44–1.00)
GFR calc non Af Amer: >60 mL/min (ref >60)

## 2016-06-19 LAB — ANGIOTENSIN CONVERTING ENZYME: Angiotensin-Converting Enzyme: 27 U/L (ref 14–82)

## 2016-06-19 MED ORDER — PREDNISONE 50 MG PO TABS: 55.0000 mg | ORAL_TABLET | Freq: Every day | ORAL | Status: DC

## 2016-06-19 MED ORDER — PREDNISONE 5 MG PO TABS
45.0000 mg | ORAL_TABLET | Freq: Every day | ORAL | Status: AC
  Administered 2016-06-21: 45 mg via ORAL
  Filled 2016-06-19: qty 2

## 2016-06-19 MED ORDER — PREDNISONE 5 MG PO TABS: ORAL_TABLET | ORAL | 0 refills | Status: DC

## 2016-06-19 MED ORDER — PREDNISONE 5 MG PO TABS
25.0000 mg | ORAL_TABLET | Freq: Every day | ORAL | Status: AC
  Administered 2016-06-25: 25 mg via ORAL
  Filled 2016-06-19: qty 1

## 2016-06-19 MED ORDER — ENOXAPARIN SODIUM 40 MG/0.4ML ~~LOC~~ SOLN: 40.0000 mg | SUBCUTANEOUS | Status: DC

## 2016-06-19 MED ORDER — PANTOPRAZOLE SODIUM 40 MG PO TBEC: 40.0000 mg | DELAYED_RELEASE_TABLET | Freq: Every day | ORAL | 0 refills | Status: DC

## 2016-06-19 MED ORDER — PREDNISONE 20 MG PO TABS: 50.0000 mg | ORAL_TABLET | Freq: Every day | ORAL | Status: DC

## 2016-06-19 MED ORDER — SORBITOL 70 % SOLN: 30.0000 mL | Freq: Every day | Status: DC | PRN

## 2016-06-19 MED ORDER — PREDNISONE 5 MG PO TABS
35.0000 mg | ORAL_TABLET | Freq: Every day | ORAL | Status: AC
  Administered 2016-06-23: 35 mg via ORAL
  Filled 2016-06-19: qty 1

## 2016-06-19 MED ORDER — PREDNISONE 20 MG PO TABS
20.0000 mg | ORAL_TABLET | Freq: Every day | ORAL | Status: AC
  Administered 2016-06-26: 20 mg via ORAL
  Filled 2016-06-19: qty 1

## 2016-06-19 MED ORDER — PREDNISONE 5 MG PO TABS
50.0000 mg | ORAL_TABLET | Freq: Every day | ORAL | Status: AC
  Administered 2016-06-20: 50 mg via ORAL
  Filled 2016-06-19: qty 2

## 2016-06-19 MED ORDER — PREDNISONE 5 MG PO TABS
15.0000 mg | ORAL_TABLET | Freq: Every day | ORAL | Status: AC
  Administered 2016-06-27: 15 mg via ORAL
  Filled 2016-06-19: qty 3

## 2016-06-19 MED ORDER — ONDANSETRON HCL 4 MG/2ML IJ SOLN: 4.0000 mg | Freq: Four times a day (QID) | INTRAMUSCULAR | Status: DC | PRN

## 2016-06-19 MED ORDER — TRAMADOL HCL 50 MG PO TABS
50.0000 mg | ORAL_TABLET | Freq: Four times a day (QID) | ORAL | Status: DC | PRN
  Administered 2016-06-20 – 2016-06-28 (×23): 50 mg via ORAL
  Filled 2016-06-19 (×3): qty 1
  Filled 2016-06-19: qty 2
  Filled 2016-06-19 (×19): qty 1

## 2016-06-19 MED ORDER — PREDNISONE 5 MG PO TABS
10.0000 mg | ORAL_TABLET | Freq: Every day | ORAL | Status: AC
  Administered 2016-06-28: 10 mg via ORAL
  Filled 2016-06-19: qty 2

## 2016-06-19 MED ORDER — PREDNISONE 5 MG PO TABS: 5.0000 mg | ORAL_TABLET | Freq: Every day | ORAL | Status: DC

## 2016-06-19 MED ORDER — ACETAMINOPHEN 325 MG PO TABS: 650.0000 mg | ORAL_TABLET | Freq: Four times a day (QID) | ORAL | Status: DC | PRN

## 2016-06-19 MED ORDER — ENOXAPARIN SODIUM 40 MG/0.4ML ~~LOC~~ SOLN
40.0000 mg | SUBCUTANEOUS | Status: DC
  Administered 2016-06-20 – 2016-06-27 (×8): 40 mg via SUBCUTANEOUS
  Filled 2016-06-19 (×8): qty 0.4

## 2016-06-19 MED ORDER — PANTOPRAZOLE SODIUM 40 MG PO TBEC
40.0000 mg | DELAYED_RELEASE_TABLET | Freq: Every day | ORAL | Status: DC
  Administered 2016-06-20 – 2016-06-28 (×9): 40 mg via ORAL
  Filled 2016-06-19 (×9): qty 1

## 2016-06-19 MED ORDER — DOCUSATE SODIUM 100 MG PO CAPS
100.0000 mg | ORAL_CAPSULE | Freq: Every day | ORAL | Status: DC
  Administered 2016-06-20 – 2016-06-28 (×9): 100 mg via ORAL
  Filled 2016-06-19 (×9): qty 1

## 2016-06-19 MED ORDER — PREDNISONE 5 MG PO TABS
30.0000 mg | ORAL_TABLET | Freq: Every day | ORAL | Status: AC
  Administered 2016-06-24: 30 mg via ORAL
  Filled 2016-06-19: qty 1

## 2016-06-19 MED ORDER — PREDNISONE 20 MG PO TABS
40.0000 mg | ORAL_TABLET | Freq: Every day | ORAL | Status: AC
  Administered 2016-06-22: 40 mg via ORAL
  Filled 2016-06-19: qty 2

## 2016-06-19 MED ORDER — ONDANSETRON HCL 4 MG PO TABS: 4.0000 mg | ORAL_TABLET | Freq: Four times a day (QID) | ORAL | Status: DC | PRN

## 2016-06-19 NOTE — Progress Notes (Signed)
Occupational Therapy Treatment Patient Details Name: Tamara [name] MRN: [id number] DOB: Dec 22, 1960 Today's Date: 06/19/2016    History of present illness Pt is an 56 y.o. female with a history of sciatica, irritable bowel syndrome, GERD and anemia, presenting with progressive numbness involving her trunk from the chest downward including lower extremities as well as numbness involving both hands. Onset of symptoms was on 06/09/2016. Loss of sensation includes sacral numbness. She's had no incontinence of urine or stool. MRI of the brain showed multiple subcentimeter T2 abnormalities indicative of probable demyelinating disease. MRI of cervical spine showed an acute 11 mm demyelinating lesion at the 3, and a smaller chronic lesion at C5.   OT comments  Pt progressing well toward OT goals. Able to stand for simulated ADL/IADL activities with good understanding of energy conservation techniques reporting her need for therapeutic seated rest-breaks appropriately throughout. Challenged B hand fine motor coordination with focus on in hand manipulation skills with pt benefiting from visual feedback to improve accuracy due to decreased sensation. Facilitated improved hot/cold and light touch discrimination sensation and initiated education concerning compensatory strategies to improve functional use of sensation during daily self-care. D/C plan remains appropriate. OT will continue to follow acutely.    Follow Up Recommendations  CIR;Supervision/Assistance - 24 hour    Equipment Recommendations  3 in 1 bedside commode    Recommendations for Other Services Rehab consult    Precautions / Restrictions Precautions Precautions: Fall Restrictions Weight Bearing Restrictions: No       Mobility Bed Mobility               General bed mobility comments: OOB in chair on OT arrival  Transfers Overall transfer level: Needs assistance Equipment used: None Transfers: Sit to/from Stand Sit to Stand:  Supervision         General transfer comment: Good technique and understanding of safety.     Balance Overall balance assessment: Needs assistance Sitting-balance support: Feet supported Sitting balance-Leahy Scale: Good     Standing balance support: No upper extremity supported;Single extremity supported;During functional activity Standing balance-Leahy Scale: Fair Standing balance comment: Able to stand without UE support for simulated ADL/IADL activities.                            ADL either performed or assessed with clinical judgement   ADL Overall ADL's : Needs assistance/impaired     Grooming: Min guard;Standing Grooming Details (indicate cue type and reason): Pt with difficulty opening small container of lip moisturizer. Provided built-up lid to improve independence and decrease pain.                             Functional mobility during ADLs: Min guard (reaching for furniture) General ADL Comments: Challenged hot/cold, rough/smooth, and light touch/pain sensation this session with both eyes open and closed. Pt with greatest difficulty with light touch discrimination and hot/cold on B palms. Educated on compensation with visual feedback while completing ADL. Facilitated improved B hand strength with IADL activity to wring out washcloths with intermittent digit extension stretching. Facilitated improved fine motor coordination to complete finger-to-palm and palm-to-finger translation. Noted pt with increased difficulty separating two sides of hand specifically in R with decreased sensation for item in palm requiring visual feedback to determine location.      Vision       Perception     Praxis  Cognition Arousal/Alertness: Awake/alert Behavior During Therapy: WFL for tasks assessed/performed Overall Cognitive Status: Within Functional Limits for tasks assessed                                          Exercises      Shoulder Instructions       General Comments      Pertinent Vitals/ Pain       Pain Assessment: Faces Faces Pain Scale: Hurts a little bit Pain Location: B LE's and B hands Pain Descriptors / Indicators: Constant Pain Intervention(s): Limited activity within patient's tolerance;Monitored during session;Repositioned  Home Living                                          Prior Functioning/Environment              Frequency  Min 3X/week        Progress Toward Goals  OT Goals(current goals can now be found in the care plan section)  Progress towards OT goals: Progressing toward goals  Acute Rehab OT Goals Patient Stated Goal: to get stronger and back to independence OT Goal Formulation: With patient Time For Goal Achievement: 06/29/16 Potential to Achieve Goals: Good ADL Goals Pt Will Perform Eating: with modified independence;with adaptive utensils;sitting Pt Will Perform Grooming: with adaptive equipment;sitting;with modified independence Pt Will Perform Upper Body Bathing: with modified independence;with adaptive equipment;sitting Pt Will Perform Lower Body Bathing: with adaptive equipment;sit to/from stand;with min guard assist Pt Will Transfer to Toilet: with min guard assist;ambulating (with DME; handicapped height toilet) Pt Will Perform Toileting - Clothing Manipulation and hygiene: with min guard assist;sit to/from stand Additional ADL Goal #1: Pt will recall and implement 3 ways to conserve energy during ADL with one or less verbal cues  Plan Discharge plan remains appropriate    Co-evaluation                 AM-PAC PT "6 Clicks" Daily Activity     Outcome Measure   Help from another person eating meals?: A Little Help from another person taking care of personal grooming?: A Little Help from another person toileting, which includes using toliet, bedpan, or urinal?: A Little Help from another person bathing (including washing,  rinsing, drying)?: A Lot Help from another person to put on and taking off regular upper body clothing?: A Little Help from another person to put on and taking off regular lower body clothing?: A Lot 6 Click Score: 16    End of Session Equipment Utilized During Treatment: Gait belt  OT Visit Diagnosis: Unsteadiness on feet (R26.81);Muscle weakness (generalized) (M62.81);Ataxia, unspecified (R27.0);Other symptoms and signs involving the nervous system (R29.898);Pain Pain - Right/Left: Right Pain - part of body: Leg   Activity Tolerance Patient tolerated treatment well   Patient Left in chair;with call bell/phone within reach;with nursing/sitter in room   Nurse Communication Mobility status        Time: 4098-1191 OT Time Calculation (min): 44 min  Charges: OT General Charges $OT Visit: 1 Procedure OT Treatments $Self Care/Home Management : 38-52 mins  Doristine Section, MS OTR/L  Pager: [phone]    Nathasha Fiorillo A Kallyn Demarcus 06/19/2016, 10:59 AM

## 2016-06-19 NOTE — Care Management Note (Signed)
Case Management Note  Patient Details  Name: Tamara [name] MRN: [id number] Date of Birth: December 07, 1960  Subjective/Objective:                    Action/Plan: Pt discharged to CIR. No further needs per CM.   Expected Discharge Date:  06/19/16               Expected Discharge Plan:  [place]  In-House Referral:     Discharge planning Services     Post Acute Care Choice:    Choice offered to:     DME Arranged:    DME Agency:     HH Arranged:    HH Agency:     Status of Service:  Completed, signed off  If discussed at [employer], dates discussed:    Additional Comments:  Pollie Friar, RN 06/19/2016, 1:36 PM

## 2016-06-19 NOTE — Progress Notes (Signed)
Subjective: Pt is feeling better today; still having difficulty sleeping more than 2 hours/night 2/2 steroids. Grip strength and bowel/bladder control continue to improve. Sensation still impaired, particularly in hands; pt still uses foam grips w/ silverware. Pt remains motivated and is looking forward to CIR rehab pending insurance approval; would prefer home health to SNF in the event that insurance approval for CIR is denied.   Objective: Vital signs in last 24 hours: Vitals:   06/18/16 1650 06/18/16 2100 06/19/16 0100 06/19/16 0500  BP: 133/68 95/68 109/70 137/64  Pulse: 65 69 61 71  Resp: 20 18 17 18   Temp: 98.5 F (36.9 C) 97.8 F (36.6 C) 97.9 F (36.6 C) 97.7 F (36.5 C)  TempSrc: Oral Oral Oral Oral  SpO2: 100% 99% 96% 98%  Weight:      Height:       Weight change:   Intake/Output Summary (Last 24 hours) at 06/19/16 0631 Last data filed at 06/18/16 2012  Gross per 24 hour  Intake                0 ml  Output                0 ml  Net                0 ml   BP 137/64 (BP Location: Left Arm)   Pulse 71   Temp 97.7 F (36.5 C) (Oral)   Resp 18   Ht 5\' 3"  (1.6 m)   Wt 102.1 kg (225 lb 1.6 oz)   SpO2 98%   BMI 39.87 kg/m   General Appearance:    Alert, cooperative, no distress, appears stated age  Head:    Normocephalic, without obvious abnormality, atraumatic  Lungs:     Clear to auscultation bilaterally, respirations unlabored   Heart:    Regular rate and rhythm, S1 and S2 normal, no murmur, rub   or gallop  Abdomen:     Soft, non-tender, bowel sounds active all four quadrants,    no masses, no organomegaly  Extremities:   Extremities normal, atraumatic, no cyanosis or edema  Pulses:   2+ and symmetric all extremities  Skin:   Skin color, texture, turgor normal, no rashes or lesions  Neurologic:   Grip strength5/5 bilaterally. Decreased sensation of hands.    Lab results:   Glucose:  80, 06/19/16 103, 06/18/16 97, 06/18/16 109, 06/18/16   Micro  Results: Recent Results (from the past 240 hour(s))  CSF culture with Stat gram stain     Status: None   Collection Time: 06/15/16  1:58 PM  Result Value Ref Range Status   Specimen Description CSF  Final   Special Requests NONE  Final   Gram Stain   Final    CYTOSPIN SMEAR WBC PRESENT,BOTH PMN AND MONONUCLEAR NO ORGANISMS SEEN    Culture NO GROWTH 3 DAYS  Final   Report Status 06/18/2016 FINAL  Final   Studies/Results: No results found. Medications: Scheduled Meds: . docusate sodium  100 mg Oral Daily  . enoxaparin (LOVENOX) injection  40 mg Subcutaneous Q24H  . insulin aspart  0-5 Units Subcutaneous QHS  . insulin aspart  0-9 Units Subcutaneous TID WC  . pantoprazole  40 mg Oral Daily  . predniSONE  60 mg Oral Q breakfast   Continuous Infusions: PRN Meds:.acetaminophen, docusate sodium, traMADol Assessment/Plan:  Transverse cervical myelitis of unknown etiology: MS most likely etiology as workup has been negative thus far; CSF oligoclonal  bands pending. Pt completed 3-day course of solu-medrol 500 mg IV BID yesterday (5/20), now on day 2 of 10 day prednisone taper (started w/ 60 mg). Neurology following, appreciate recs. 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 -Ulcer prophylaxis: Protonix 40 mg QD  -Pain regimen: Tylenol + tramadol PRN -Continue cymbalta  -Discharge to CIR pending insurance approval.   FEN/GI:Regular diet, replete electrolytes as needed VTE ppx: Lovenox Code status: Full code  Dispo: Anticipated discharge today to CIR pending insurance approval.  This is a Careers information officer Note.  The care of the patient was discussed with Dr. Holley Raring and the assessment and plan formulated with their assistance.  Please see their attached note for official documentation of the daily encounter.   LOS: 4 days   Jeri Cos, Medical Student 06/19/2016, 6:31 AM

## 2016-06-19 NOTE — H&P (Signed)
Physical Medicine and Rehabilitation Admission H&P    Chief Complaint  Patient presents with  . Numbness  . Urinary Retention  : HPI: Tamara [name] is a 56 y.o. right handed female with history of sciatica, irritable bowel syndrome, chronic anemia. Per chart review patient lives with spouse. Independent prior to admission. 2 level home with bedroom on first floor and 4 steps to entry. Husband is disabled. She presented 06/14/2016 with progressive numbness involving her trunk from the chest including lower extremities as well as numbness involving both hands. MRI of the brain reviewed, showing demyelination.  Per report, 5 cm white matter lesion concerning for chronic demyelination, dominant 9 mm right parietal lesion. MRI cervical thoracic and lumbar spine shows acute 11 mm C3 demyelinating lesion. Chronic 5 mm C5 demyelinating lesion. No MR findings of demyelination and thoracic spinal cord or lumbar spine. CSF,VZV titers negative. Placed on IV Solu-Medrol per neurology services suspect cervical myelitis/multiple sclerosis and plan follow-up outpatient with Dr. Serita Grit of neurology services. Subcutaneous Lovenox for DVT prophylaxis. Physical and occupational therapy evaluation completed with recommendations of physical medicine rehabilitation consult.Patient was admitted for a comprehensive rehabilitation program  Review of Systems  Constitutional: Negative for chills and fever.  HENT: Negative for hearing loss.   Eyes: Negative for blurred vision and double vision.  Respiratory: Negative for shortness of breath.   Cardiovascular: Negative for chest pain, palpitations and leg swelling.  Gastrointestinal: Positive for diarrhea. Negative for nausea.  Genitourinary: Negative for dysuria, flank pain and hematuria.       Noted some urinary retention  Musculoskeletal: Positive for back pain and myalgias.  Skin: Negative for rash.  Neurological: Positive for tingling and weakness.  All  other systems reviewed and are negative.  Past Medical History:  Diagnosis Date  . Anemia   . Bowel obstruction ([place])   . GERD (gastroesophageal reflux disease)   . IBS (irritable bowel syndrome)    at age of 63  . Sciatica 2009   Past Surgical History:  Procedure Laterality Date  . ABDOMINAL HYSTERECTOMY  2008   cervix and right ovary still intact  . KNEE ARTHROSCOPY  2010 and 2011   Left knee, x2   Family History  Problem Relation Age of Onset  . Cancer Father        Gallbladder  . Gallbladder disease Father   . Hypertension Brother   . Gallbladder disease Sister        x3  . Gallbladder disease Paternal Grandmother   . Colon cancer Neg Hx   . Colon polyps Neg Hx   . Esophageal cancer Neg Hx   . Kidney disease Neg Hx    Social History:  reports that she has never smoked. She has never used smokeless tobacco. She reports that she does not drink alcohol or use drugs. Allergies:  Allergies  Allergen Reactions  . Codeine Other (See Comments)    Delusions  . Dilaudid [Hydromorphone Hcl] Swelling    Tongue swells   . Paxil [Paroxetine Hcl] Other (See Comments)    Hallucinations and heavy periods   . Adhesive [Tape] Hives, Itching and Rash    PAPER TAPE=   . Augmentin [Amoxicillin-Pot Clavulanate] Rash   Medications Prior to Admission  Medication Sig Dispense Refill  . ACAI BERRY PO Take 1 capsule by mouth daily as needed (for vitamin).    Marland Kitchen b complex vitamins tablet Take 1 tablet by mouth daily.    . benzocaine-menthol (CHLORAEPTIC) 6-10 MG lozenge  Take 1 lozenge by mouth as needed for sore throat. 18 tablet 0  . Biotin w/ Vitamins C & E (HAIR/SKIN/NAILS PO) Take 1 tablet by mouth daily.    . Camphor-Eucalyptus-Menthol (VICKS VAPORUB) 4.73-1.2-2.6 % OINT Apply 1 application topically daily as needed (for feet).    . cetirizine (ZYRTEC ALLERGY) 10 MG tablet Take 1 tablet (10 mg total) by mouth daily. (Patient taking differently: Take 10 mg by mouth daily as needed  for allergies. ) 30 tablet 2  . cholecalciferol (VITAMIN D) 400 UNITS TABS tablet Take 400 Units by mouth daily.     . DULoxetine (CYMBALTA) 30 MG capsule Take 1 capsule (30 mg total) by mouth daily. 30 capsule 0  . fluticasone (FLONASE) 50 MCG/ACT nasal spray Place 2 sprays into both nostrils daily. (Patient taking differently: Place 2 sprays into both nostrils daily as needed for allergies. ) 16 g 2  . furosemide (LASIX) 20 MG tablet Take 1 tablet (20 mg total) by mouth daily as needed for fluid or edema. 30 tablet 3  . GARCINIA CAMBOGIA-CHROMIUM PO Take 1 capsule by mouth daily as needed (for vitamin).    Marland Kitchen guaiFENesin (MUCINEX) 600 MG 12 hr tablet Take 1 tablet (600 mg total) by mouth 2 (two) times daily. (Patient taking differently: Take 600 mg by mouth 2 (two) times daily as needed for cough or to loosen phlegm. ) 14 tablet 0  . guaiFENesin-codeine 100-10 MG/5ML syrup Take 5 mLs by mouth every 4 (four) hours as needed for cough. 120 mL 0  . ibuprofen (ADVIL,MOTRIN) 800 MG tablet Take 1 tablet (800 mg total) by mouth every 8 (eight) hours as needed. 30 tablet 0  . Multiple Vitamin (MULTIVITAMIN) tablet Take 1 tablet by mouth daily.    . Omega-3 Fatty Acids (FISH OIL) 1000 MG CAPS Take 1,000-2,000 mg by mouth See admin instructions. Take 2 capsules in the morning and take 1 capsule at bedtime     . oxymetazoline (AFRIN NASAL SPRAY) 0.05 % nasal spray Place 1 spray into both nostrils 2 (two) times daily. (Patient taking differently: Place 1 spray into both nostrils 2 (two) times daily as needed for congestion. ) 30 mL 0  . Polyethyl Glycol-Propyl Glycol (SYSTANE) 0.4-0.3 % SOLN Place 1 drop into both eyes daily as needed (for dry eyes).    . Potassium 75 MG TABS Take 1 tablet by mouth daily.     . predniSONE (DELTASONE) 20 MG tablet Take 2 tablets (40 mg total) by mouth daily with breakfast. For the next four days 8 tablet 0  . vitamin E 100 UNIT capsule Take 100 Units by mouth daily.       Home: Home Living Family/patient expects to be discharged to:: Private residence Living Arrangements: Spouse/significant other Available Help at Discharge: Family, Available 24 hours/day Type of Home: House Home Access: Stairs to enter Technical brewer of Steps: 4 Entrance Stairs-Rails: None Home Layout: Two level, Able to live on main level with bedroom/bathroom Alternate Level Stairs-Number of Steps: flight Alternate Level Stairs-Rails: Right Bathroom Shower/Tub: Multimedia programmer: Handicapped height Bathroom Accessibility: Yes Home Equipment: Hand held shower head, Grab bars - tub/shower Additional Comments: husband is disabled (heart disease), Pt cares for her 80 year old grandaughter   Functional History: Prior Function Level of Independence: Independent Comments: pt states that she cares for her husband and babysits her granddaughter every day. She enjoys painting and decorating cakes  Functional Status:  Mobility: Bed Mobility Overal bed mobility: Needs Assistance  Bed Mobility: Supine to Sit Supine to sit: Min guard Sit to supine: Min guard General bed mobility comments: OOB in chair on OT arrival Transfers Overall transfer level: Needs assistance Equipment used: None Transfers: Sit to/from Stand Sit to Stand: Supervision Stand pivot transfers: Mod assist, +2 safety/equipment General transfer comment: Good technique and understanding of safety.  Ambulation/Gait Ambulation/Gait assistance: Min assist, +2 physical assistance, Mod assist, +2 safety/equipment Ambulation Distance (Feet): 100 Feet Assistive device: Rolling walker (2 wheeled) Gait Pattern/deviations: Step-through pattern General Gait Details: pt unable to coordinate gait L LE or R LE.  Characterized by varying step lengths, heel/toe or foot flat contact.  Pt tended toward narrowed BOS when unable to look at her feet.  Very little knee buckling and pt was strong enough to overcome a  gross misstep. Gait velocity interpretation: Below normal speed for age/gender    ADL: ADL Overall ADL's : Needs assistance/impaired Eating/Feeding: Set up, With adaptive utensils, Cueing for compensatory techinques, Sitting Grooming: Min guard, Standing Grooming Details (indicate cue type and reason): Pt with difficulty opening small container of lip moisturizer. Provided built-up lid to improve independence and decrease pain. Upper Body Bathing: Sitting, Minimal assistance Upper Body Bathing Details (indicate cue type and reason): Pt unable to maintain grip on washcloth during UB bathing, she required assist for her back Lower Body Bathing: Moderate assistance, Sitting/lateral leans, With adaptive equipment Lower Body Bathing Details (indicate cue type and reason): BLE very sensative to touch, Pt's face visually grimacing during cleaning. Pt able to get thighs, but required assist for the knees down Upper Body Dressing : Set up, Sitting Lower Body Dressing: Sit to/from stand, Moderate assistance, Maximal assistance Lower Body Dressing Details (indicate cue type and reason): max a to don socks sitting in chair Toilet Transfer: Minimal assistance, Ambulation, RW, BSC (over toilet) Toilet Transfer Details (indicate cue type and reason): cues for hand placement Toileting- Clothing Manipulation and Hygiene: Min guard, Sit to/from stand Functional mobility during ADLs: Min guard (reaching for furniture) General ADL Comments: Challenged hot/cold, rough/smooth, and light touch/pain sensation this session with both eyes open and closed. Pt with greatest difficulty with light touch discrimination and hot/cold on B palms. Educated on compensation with visual feedback while completing ADL. Facilitated improved B hand strength with IADL activity to wring out washcloths with intermittent digit extension stretching. Facilitated improved fine motor coordination to complete finger-to-palm and palm-to-finger  translation. Noted pt with increased difficulty separating two sides of hand specifically in R with decreased sensation for item in palm requiring visual feedback to determine location.   Cognition: Cognition Overall Cognitive Status: Within Functional Limits for tasks assessed Orientation Level: Oriented X4 Cognition Arousal/Alertness: Awake/alert Behavior During Therapy: WFL for tasks assessed/performed Overall Cognitive Status: Within Functional Limits for tasks assessed  Physical Exam: Blood pressure 137/64, pulse 71, temperature 97.7 F (36.5 C), temperature source Oral, resp. rate 18, height 5\' 3"  (1.6 m), weight 102.1 kg (225 lb 1.6 oz), SpO2 98 %. Physical Exam  Vitals reviewed. Constitutional: She appears well-developed.  Obese  HENT:  Head: Normocephalic and atraumatic.  Eyes: EOM are normal. Right eye exhibits no discharge. Left eye exhibits no discharge.  Neck: Normal range of motion. Neck supple. No thyromegaly present.  Cardiovascular: Normal rate, regular rhythm and normal heart sounds.   Respiratory: Effort normal and breath sounds normal. No respiratory distress.  GI: Soft. Bowel sounds are normal. She exhibits no distension.  Musculoskeletal: She exhibits no edema or tenderness.  Neurological:  Follows full commands.  Cognitively intact.  Motor: B/l UE 4/5 deltoid, biceps, triceps, wrists and hands limited by discomfort.  B/l LEs 4+/5 HF, KE, ADF/PF.  Diminished sensation to LT in both legs,trunk, hands. DTRs 3+ b/l LE  Skin: Skin is warm and dry.  Psychiatric: She has a normal mood and affect. Her behavior is normal. Thought content normal.     Results for orders placed or performed during the hospital encounter of 06/14/16 (from the past 48 hour(s))  Glucose, capillary     Status: Abnormal   Collection Time: 06/17/16 12:14 PM  Result Value Ref Range   Glucose-Capillary 108 (H) 65 - 99 mg/dL  Glucose, capillary     Status: Abnormal   Collection Time:  06/17/16  5:09 PM  Result Value Ref Range   Glucose-Capillary 123 (H) 65 - 99 mg/dL  Glucose, capillary     Status: Abnormal   Collection Time: 06/17/16  9:55 PM  Result Value Ref Range   Glucose-Capillary 106 (H) 65 - 99 mg/dL   Comment 1 Notify RN    Comment 2 Document in Chart   Glucose, capillary     Status: None   Collection Time: 06/18/16  6:29 AM  Result Value Ref Range   Glucose-Capillary 85 65 - 99 mg/dL   Comment 1 Notify RN    Comment 2 Document in Chart   Glucose, capillary     Status: Abnormal   Collection Time: 06/18/16 11:34 AM  Result Value Ref Range   Glucose-Capillary 109 (H) 65 - 99 mg/dL   Comment 1 Notify RN    Comment 2 Document in Chart   Glucose, capillary     Status: None   Collection Time: 06/18/16  4:46 PM  Result Value Ref Range   Glucose-Capillary 97 65 - 99 mg/dL  Glucose, capillary     Status: Abnormal   Collection Time: 06/18/16  9:18 PM  Result Value Ref Range   Glucose-Capillary 103 (H) 65 - 99 mg/dL   Comment 1 Notify RN    Comment 2 Document in Chart   Glucose, capillary     Status: None   Collection Time: 06/19/16  6:06 AM  Result Value Ref Range   Glucose-Capillary 80 65 - 99 mg/dL   No results found.     Medical Problem List and Plan: 1.  Gait deficits, sensory loss related to demyelinating disease secondary to multiple sclerosis versus cervical myelitis. IV Solu-Medrol completed prednisone taper As directed with 50 mg daily 1 day then decrease by 5 mg each day until completed 2.  DVT Prophylaxis/Anticoagulation: Subcutaneous Lovenox. Check vascular study 3. Pain Management including neuropathic pain: Ultram as needed 4. Mood: Provide emotional support 5. Neuropsych: This patient is capable of making decisions on her own behalf. 6. Skin/Wound Care: Routine skin checks 7. Fluids/Electrolytes/Nutrition: Routine I&O with follow-up chemistries 8. Neurogenic bowel and bladder. Establish bowel program. Check PVR 3 9. Chronic anemia.  Follow-up CBC 10. Leukocytosis: Follow-up CBC 11. Steroid induced hyperglycemia: Cont to monitor  Post Admission Physician Evaluation: 1. Preadmission assessment reviewed and changes made below. 2. Functional deficits secondary  to demyelinating disease. 3. Patient is admitted to receive collaborative, interdisciplinary care between the physiatrist, rehab nursing staff, and therapy team. 4. Patient's level of medical complexity and substantial therapy needs in context of that medical necessity cannot be provided at a lesser intensity of care such as a SNF. 5. Patient has experienced substantial functional loss from his/her baseline which was documented above under the "Functional History"  and "Functional Status" headings.  Judging by the patient's diagnosis, physical exam, and functional history, the patient has potential for functional progress which will result in measurable gains while on inpatient rehab.  These gains will be of substantial and practical use upon discharge  in facilitating mobility and self-care at the household level. 6. Physiatrist will provide 24 hour management of medical needs as well as oversight of the therapy plan/treatment and provide guidance as appropriate regarding the interaction of the two. 7. The Preadmission Screening has been reviewed and patient status is unchanged unless otherwise stated above. 8. 24 hour rehab nursing will assist with bladder management, bowel management, safety, skin/wound care, disease management, pain management and patient education  and help integrate therapy concepts, techniques,education, etc. 9. PT will assess and treat for/with: Lower extremity strength, range of motion, stamina, balance, functional mobility, safety, adaptive techniques and equipment,  coping skills, pain control, education.   Goals are: Mod I. 10. OT will assess and treat for/with: ADL's, functional mobility, safety, upper extremity strength, adaptive techniques and  equipment, ego support, and community reintegration.   Goals are: Mod I. Therapy may proceed with showering this patient. 11. Case Management and Social Worker will assess and treat for psychological issues and discharge planning. 12. Team conference will be held weekly to assess progress toward goals and to determine barriers to discharge. 13. Patient will receive at least 3 hours of therapy per day at least 5 days per week. 14. ELOS: 8-12 days.       15. Prognosis:  good  Delice Lesch, MD, Mellody Drown Cathlyn Parsons., PA-C 06/19/2016

## 2016-06-19 NOTE — Progress Notes (Signed)
   Subjective:  Pt is feeling better well today with continued improvements in mobility and B/B control. Still with significant anesthesia of LE and hands.  Objective:  Vital signs in last 24 hours: Vitals:   06/18/16 1650 06/18/16 2100 06/19/16 0100 06/19/16 0500  BP: 133/68 95/68 109/70 137/64  Pulse: 65 69 61 71  Resp: 20 18 17 18   Temp: 98.5 F (36.9 C) 97.8 F (36.6 C) 97.9 F (36.6 C) 97.7 F (36.5 C)  TempSrc: Oral Oral Oral Oral  SpO2: 100% 99% 96% 98%  Weight:      Height:       General Apperance: NAD HEENT: Normocephalic, atraumatic, anicteric sclera Lungs: Clear to auscultation bilaterally. No wheezes, rhonchi or rales. Breathing comfortably on RA.  Heart: Regular rate and rhythm, no murmur/rub/gallop Abdomen: Soft, nontender, nondistended, no rebound/guarding Extremities: Warm and well perfused, no edema Skin: No rashes or lesions Neurologic: Alert and interactive. CN II-XII grossly intact. Hyperesthesia in feet bilaterally improved. Strength intact BLE. Grip strength and forearm flexor strength 5/5 bilaterally. Decreased sensation of hands, unable to feel coin in hand. No visual disturbance.  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 S/p solu-medrol 500 mg IV BID x 3 days, now on 10 day pred taper. CSF oligoclonal bands still pending. To CIR for acute rehab and improvement in functional status. -- 10 day Pred taper today, decrease 5mg  daily x 10 days: 55mg  today. -- Protonix 40 mg QD for ulcer ppx -- follow CBGs, SSI PRN -- PT/OT eval and treat -- CIR for dispo -- Tylenol / tramadol PRN severe pain -- Continue cymbalta  FEN/GI: Regular diet, replete electrolytes as needed VTE ppx: Lovenox Code status: Full code  Dispo: Anticipated discharge to CIR pending insurance approval.  Holley Raring, MD 06/19/2016, 11:27 AM Pager: [phone]
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
## 2016-06-19 NOTE — Progress Notes (Addendum)
Inpatient Rehabilitation  I received insurance approval from pt's [employer] for an IP Rehab admission.  Pt. Is pleased and agreeable.  I await call back from Dr. Gay Filler for medical clearance.    [facility] Admissions Coordinator Cell [phone] Office [phone]   Dr. Gay Filler has cleared pt. medically for admission to CIR today.  I will make all arrangements for admission later today. I have updated Jacqualin Combes, RNCM of the plan.  Gerlean Ren
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
## 2016-06-19 NOTE — Progress Notes (Signed)
Physical Therapy Treatment Patient Details Name: Tamara [name] MRN: [id number] DOB: 1960/06/16 Today's Date: 06/19/2016    History of Present Illness Pt is an 56 y.o. female with a history of sciatica, irritable bowel syndrome, GERD and anemia, presenting with progressive numbness involving her trunk from the chest downward including lower extremities as well as numbness involving both hands. Onset of symptoms was on 06/09/2016. Loss of sensation includes sacral numbness. She's had no incontinence of urine or stool. MRI of the brain showed multiple subcentimeter T2 abnormalities indicative of probable demyelinating disease. MRI of cervical spine showed an acute 11 mm demyelinating lesion at the 3, and a smaller chronic lesion at C5.    PT Comments    Pt progressing towards physical therapy goals. Was able to perform transfers and ambulation with gross min assist for balance support and safety. Pt ambulating short distances without assist on unit, however during PT session demonstrated continued need for supervision/assist during longer ambulation distance as well as balance activity. Pt reports significant ongoing sensory deficits in upper and lower extremities, and has been developing compensatory strategies to know when feet are on the floor, hands are on the chair to sit/stand, etc. Continue to feel that pt is appropriate for CIR level therapies to maximize functional independence prior to d/c home.   Follow Up Recommendations  CIR;Supervision/Assistance - 24 hour     Equipment Recommendations  None recommended by PT    Recommendations for Other Services Rehab consult     Precautions / Restrictions Precautions Precautions: Fall Precaution Comments: Fatigues quickly with ambulation Restrictions Weight Bearing Restrictions: No    Mobility  Bed Mobility               General bed mobility comments: Pt was OOB in chair when PT arrived.   Transfers Overall transfer level: Needs  assistance Equipment used: Rolling walker (2 wheeled) Transfers: Sit to/from Stand Sit to Stand: Min guard         General transfer comment: Close guard for safety as pt powered-up to full standing position. Pt with good hand placement on seated surface for safety.  Ambulation/Gait Ambulation/Gait assistance: Min assist Ambulation Distance (Feet): 100 Feet Assistive device: Rolling walker (2 wheeled) Gait Pattern/deviations: Step-through pattern Gait velocity: Decreased Gait velocity interpretation: Below normal speed for age/gender General Gait Details: Exaggerated heel strike as pt reports this is how she knows where her feet are and if they are on the ground. Min assist provided for safety and balance. Pt appeared very steady at first but reports fatigue and feeling of knees giving out on her after ~50'. Was able to tolerate dynamic balance activity such as side stepping L and R (stepping right more difficult), turns (turning L more difficult) and backwards walking.    Stairs            Wheelchair Mobility    Modified Rankin (Stroke Patients Only)       Balance Overall balance assessment: Needs assistance Sitting-balance support: Feet supported Sitting balance-Leahy Scale: Good     Standing balance support: No upper extremity supported;Single extremity supported;During functional activity Standing balance-Leahy Scale: Fair Standing balance comment: Requires UE support for dynamic standing activity.              High level balance activites: Side stepping;Backward walking;Turns;Direction changes High Level Balance Comments: See gait training details            Cognition Arousal/Alertness: Awake/alert Behavior During Therapy: WFL for tasks assessed/performed Overall Cognitive Status: Within  Functional Limits for tasks assessed                                        Exercises      General Comments        Pertinent Vitals/Pain Pain  Assessment: Faces Faces Pain Scale: Hurts a little bit Pain Location: B LE's and B hands Pain Descriptors / Indicators: Constant Pain Intervention(s): Limited activity within patient's tolerance;Monitored during session;Repositioned    Home Living                      Prior Function            PT Goals (current goals can now be found in the care plan section) Acute Rehab PT Goals Patient Stated Goal: to get stronger and back to independence PT Goal Formulation: With patient Time For Goal Achievement: 06/29/16 Potential to Achieve Goals: Good Progress towards PT goals: Progressing toward goals    Frequency    Min 3X/week      PT Plan Current plan remains appropriate    Co-evaluation              AM-PAC PT "6 Clicks" Daily Activity  Outcome Measure  Difficulty turning over in bed (including adjusting bedclothes, sheets and blankets)?: A Little Difficulty moving from lying on back to sitting on the side of the bed? : A Little Difficulty sitting down on and standing up from a chair with arms (e.g., wheelchair, bedside commode, etc,.)?: Total Help needed moving to and from a bed to chair (including a wheelchair)?: A Lot Help needed walking in hospital room?: A Lot Help needed climbing 3-5 steps with a railing? : Total 6 Click Score: 12    End of Session Equipment Utilized During Treatment: Gait belt Activity Tolerance: Patient tolerated treatment well Patient left: in chair;with call bell/phone within reach;with chair alarm set Nurse Communication: Mobility status PT Visit Diagnosis: Unsteadiness on feet (R26.81);Other abnormalities of gait and mobility (R26.89);Other symptoms and signs involving the nervous system (R29.898)     Time: 6644-0347 PT Time Calculation (min) (ACUTE ONLY): 28 min  Charges:  $Gait Training: 8-22 mins $Neuromuscular Re-education: 8-22 mins                    G Codes:       Rolinda Roan, PT, DPT Acute Rehabilitation  Services Pager: [phone]    Thelma Comp 06/19/2016, 1:41 PM

## 2016-06-20 ENCOUNTER — Inpatient Hospital Stay (HOSPITAL_COMMUNITY): Payer: BLUE CROSS/BLUE SHIELD

## 2016-06-20 ENCOUNTER — Inpatient Hospital Stay (HOSPITAL_COMMUNITY): Payer: BLUE CROSS/BLUE SHIELD | Admitting: Occupational Therapy

## 2016-06-20 ENCOUNTER — Encounter (HOSPITAL_COMMUNITY): Payer: Self-pay

## 2016-06-20 DIAGNOSIS — G35 Multiple sclerosis: Secondary | ICD-10-CM

## 2016-06-20 DIAGNOSIS — R609 Edema, unspecified: Secondary | ICD-10-CM

## 2016-06-20 DIAGNOSIS — R269 Unspecified abnormalities of gait and mobility: Secondary | ICD-10-CM

## 2016-06-20 LAB — CBC WITH DIFFERENTIAL/PLATELET
BASOS PCT: 0 %
Basophils Absolute: 0 10*3/uL (ref 0.0–0.1)
EOS ABS: 0 10*3/uL (ref 0.0–0.7)
Eosinophils Relative: 0 %
HCT: 35.8 % — ABNORMAL LOW (ref 36.0–46.0)
HEMOGLOBIN: 11.3 g/dL — AB (ref 12.0–15.0)
LYMPHS ABS: 2.3 10*3/uL (ref 0.7–4.0)
Lymphocytes Relative: 24 %
MCH: 27.6 pg (ref 26.0–34.0)
MCHC: 31.6 g/dL (ref 30.0–36.0)
MCV: 87.5 fL (ref 78.0–100.0)
MONO ABS: 0.9 10*3/uL (ref 0.1–1.0)
MONOS PCT: 10 %
NEUTROS PCT: 66 %
Neutro Abs: 6.5 10*3/uL (ref 1.7–7.7)
Platelets: 161 10*3/uL (ref 150–400)
RBC: 4.09 MIL/uL (ref 3.87–5.11)
RDW: 14.7 % (ref 11.5–15.5)
WBC: 9.8 10*3/uL (ref 4.0–10.5)

## 2016-06-20 LAB — COMPREHENSIVE METABOLIC PANEL
ALK PHOS: 47 U/L (ref 38–126)
ALT: 20 U/L (ref 14–54)
ANION GAP: 6 (ref 5–15)
AST: 14 U/L — ABNORMAL LOW (ref 15–41)
Albumin: 2.7 g/dL — ABNORMAL LOW (ref 3.5–5.0)
BILIRUBIN TOTAL: 0.4 mg/dL (ref 0.3–1.2)
BUN: 15 mg/dL (ref 6–20)
CO2: 30 mmol/L (ref 22–32)
CREATININE: 0.87 mg/dL (ref 0.44–1.00)
Calcium: 8.1 mg/dL — ABNORMAL LOW (ref 8.9–10.3)
Chloride: 104 mmol/L (ref 101–111)
GFR calc non Af Amer: >60 mL/min (ref >60)
GLUCOSE: 85 mg/dL (ref 65–99)
POTASSIUM: 3.7 mmol/L (ref 3.5–5.1)
SODIUM: 140 mmol/L (ref 135–145)
Total Protein: 5.1 g/dL — ABNORMAL LOW (ref 6.5–8.1)

## 2016-06-20 MED ORDER — PRO-STAT SUGAR FREE PO LIQD
30.0000 mL | Freq: Two times a day (BID) | ORAL | Status: DC
  Administered 2016-06-20 – 2016-06-28 (×17): 30 mL via ORAL
  Filled 2016-06-20 (×17): qty 30

## 2016-06-20 NOTE — Progress Notes (Signed)
Alert and oriented x 4 and very energetic and articulate. Arrived on floor at approximately 1945.

## 2016-06-20 NOTE — Progress Notes (Signed)
[name], MD Physician Signed Physical Medicine and Rehabilitation  Consult Note Date of Service: 06/16/2016 9:22 AM  Related encounter: ED to Hosp-Admission (Discharged) from 06/14/2016 in [facility] All Collapse All   [] Hide copied text [] Hover for attribution information      Physical Medicine and Rehabilitation Consult Reason for Consult: Decreased functional mobility with increasing lower extremity weakness and numbness Referring Physician: Internal medicine   HPI: Tamara [name] is a 56 y.o. right handed female with history of sciatica, irritable bowel syndrome, chronic anemia. Per chart review patient lives with spouse. Independent prior to admission. 2 level home with bedroom on first floor and 4 steps to entry. Husband is disabled. She presented 06/14/2016 with progressive numbness involving her trunk from the chest including lower extremities as well as numbness involving both hands. MRI of the brain showed a 5 cm white matter lesion concerning for chronic demyelination, dominant 9 mm right parietal lesion. MRI cervical thoracic and lumbar spine shows acute l 11 mm C3 demyelinating lesion. Chronic 5 mm C5 demyelinating lesion. No MR findings of demyelination and thoracic spinal cord or lumbar spine. CSF studies pending. Placed on IV Solu-Medrol per neurology services suspect cervical myelitis/multiple sclerosis. Subcutaneous Lovenox for DVT prophylaxis. Physical and occupational therapy evaluation completed with recommendations of physical medicine rehabilitation consult.   Review of Systems  Constitutional: Negative for chills and fever.  HENT: Negative for hearing loss and tinnitus.   Eyes: Negative for blurred vision and double vision.  Respiratory: Negative for cough and shortness of breath.   Cardiovascular: Negative for chest pain, palpitations and leg swelling.  Gastrointestinal: Positive for diarrhea. Negative for  nausea and vomiting.  Genitourinary: Negative for dysuria, flank pain and hematuria.  Musculoskeletal: Positive for back pain and myalgias.  Skin: Negative for rash.  Neurological: Positive for tingling and weakness. Negative for seizures.       Numbness of the hands and lower extremities  All other systems reviewed and are negative.      Past Medical History:  Diagnosis Date  . Anemia   . Bowel obstruction ([place])   . GERD (gastroesophageal reflux disease)   . IBS (irritable bowel syndrome)    at age of 56  . Sciatica 2009        Past Surgical History:  Procedure Laterality Date  . ABDOMINAL HYSTERECTOMY  2008   cervix and right ovary still intact  . KNEE ARTHROSCOPY  2010 and 2011   Left knee, x2        Family History  Problem Relation Age of Onset  . Cancer Father        Gallbladder  . Gallbladder disease Father   . Hypertension Brother   . Gallbladder disease Sister        x3  . Gallbladder disease Paternal Grandmother   . Colon cancer Neg Hx   . Colon polyps Neg Hx   . Esophageal cancer Neg Hx   . Kidney disease Neg Hx    Social History:  reports that she has never smoked. She has never used smokeless tobacco. She reports that she does not drink alcohol or use drugs. Allergies:       Allergies  Allergen Reactions  . Codeine Other (See Comments)    Delusions  . Dilaudid [Hydromorphone Hcl] Swelling    Tongue swells   . Paxil [Paroxetine Hcl] Other (See Comments)    Hallucinations and heavy periods   .  Adhesive [Tape] Hives, Itching and Rash    PAPER TAPE=   . Augmentin [Amoxicillin-Pot Clavulanate] Rash         Medications Prior to Admission  Medication Sig Dispense Refill  . ACAI BERRY PO Take 1 capsule by mouth daily as needed (for vitamin).    Marland Kitchen b complex vitamins tablet Take 1 tablet by mouth daily.    . benzocaine-menthol (CHLORAEPTIC) 6-10 MG lozenge Take 1 lozenge by mouth as needed for sore throat. 18  tablet 0  . Biotin w/ Vitamins C & E (HAIR/SKIN/NAILS PO) Take 1 tablet by mouth daily.    . Camphor-Eucalyptus-Menthol (VICKS VAPORUB) 4.73-1.2-2.6 % OINT Apply 1 application topically daily as needed (for feet).    . cetirizine (ZYRTEC ALLERGY) 10 MG tablet Take 1 tablet (10 mg total) by mouth daily. (Patient taking differently: Take 10 mg by mouth daily as needed for allergies. ) 30 tablet 2  . cholecalciferol (VITAMIN D) 400 UNITS TABS tablet Take 400 Units by mouth daily.     . DULoxetine (CYMBALTA) 30 MG capsule Take 1 capsule (30 mg total) by mouth daily. 30 capsule 0  . fluticasone (FLONASE) 50 MCG/ACT nasal spray Place 2 sprays into both nostrils daily. (Patient taking differently: Place 2 sprays into both nostrils daily as needed for allergies. ) 16 g 2  . furosemide (LASIX) 20 MG tablet Take 1 tablet (20 mg total) by mouth daily as needed for fluid or edema. 30 tablet 3  . GARCINIA CAMBOGIA-CHROMIUM PO Take 1 capsule by mouth daily as needed (for vitamin).    Marland Kitchen guaiFENesin (MUCINEX) 600 MG 12 hr tablet Take 1 tablet (600 mg total) by mouth 2 (two) times daily. (Patient taking differently: Take 600 mg by mouth 2 (two) times daily as needed for cough or to loosen phlegm. ) 14 tablet 0  . guaiFENesin-codeine 100-10 MG/5ML syrup Take 5 mLs by mouth every 4 (four) hours as needed for cough. 120 mL 0  . ibuprofen (ADVIL,MOTRIN) 800 MG tablet Take 1 tablet (800 mg total) by mouth every 8 (eight) hours as needed. 30 tablet 0  . Multiple Vitamin (MULTIVITAMIN) tablet Take 1 tablet by mouth daily.    . Omega-3 Fatty Acids (FISH OIL) 1000 MG CAPS Take 1,000-2,000 mg by mouth See admin instructions. Take 2 capsules in the morning and take 1 capsule at bedtime     . oxymetazoline (AFRIN NASAL SPRAY) 0.05 % nasal spray Place 1 spray into both nostrils 2 (two) times daily. (Patient taking differently: Place 1 spray into both nostrils 2 (two) times daily as needed for congestion. ) 30 mL 0  .  Polyethyl Glycol-Propyl Glycol (SYSTANE) 0.4-0.3 % SOLN Place 1 drop into both eyes daily as needed (for dry eyes).    . Potassium 75 MG TABS Take 1 tablet by mouth daily.     . predniSONE (DELTASONE) 20 MG tablet Take 2 tablets (40 mg total) by mouth daily with breakfast. For the next four days 8 tablet 0  . vitamin E 100 UNIT capsule Take 100 Units by mouth daily.      Home: Home Living Family/patient expects to be discharged to:: Private residence Living Arrangements: Spouse/significant other Available Help at Discharge: Family, Available 24 hours/day Type of Home: House Home Access: Stairs to enter [employer] of Steps: 4 Entrance Stairs-Rails: None Home Layout: Two level, Able to live on main level with bedroom/bathroom Alternate Level Stairs-Number of Steps: flight Alternate Level Stairs-Rails: Right Bathroom Shower/Tub: Multimedia programmer:  Handicapped height Bathroom Accessibility: Yes Home Equipment: Hand held shower head, Grab bars - tub/shower Additional Comments: husband is disabled (heart disease), Pt cares for her 57 year old grandaughter  Functional History: Prior Function Level of Independence: Independent Comments: pt states that she cares for her husband and babysits her granddaughter every day. She enjoys painting and decorating cakes Functional Status:  Mobility: Bed Mobility Overal bed mobility: Needs Assistance Bed Mobility: Supine to Sit, Sit to Supine Supine to sit: Min guard Sit to supine: Min guard General bed mobility comments: increased time, vc'ing for sequencing and close min guard for safety Transfers Overall transfer level: Needs assistance Equipment used: 2 person hand held assist Transfers: Sit to/from Stand, Stand Pivot Transfers Sit to Stand: Mod assist, +2 safety/equipment Stand pivot transfers: Mod assist, +2 safety/equipment General transfer comment: increased time, vc'ing for bilateral hand placement,  min-mod A as pt very unstable with rise into standing from bed and BSC Ambulation/Gait General Gait Details: deferred at this time as pt unable to safely advance either foot forwards with RW per Egress Test. pt able to take 3 side steps laterally in both directions; however, requiring mod A with significant instability and LOB posteriorly with knees buckling  ADL: ADL Overall ADL's : Needs assistance/impaired Eating/Feeding: Set up, With adaptive utensils, Cueing for compensatory techinques, Sitting Grooming: Wash/dry hands, Wash/dry face, Oral care, Moderate assistance, With adaptive equipment, Sitting Grooming Details (indicate cue type and reason): used wash cloth to build up handles of toothbrush for oral care. Pt require BUE to perform. Pt required assist to maintain control of washcloth during face washing. Upper Body Bathing: Sitting, Minimal assistance Upper Body Bathing Details (indicate cue type and reason): Pt unable to maintain grip on washcloth during UB bathing, she required assist for her back Lower Body Bathing: Moderate assistance, Sitting/lateral leans, With adaptive equipment Lower Body Bathing Details (indicate cue type and reason): BLE very sensative to touch, Pt's face visually grimacing during cleaning. Pt able to get thighs, but required assist for the knees down Upper Body Dressing : Set up, Sitting Lower Body Dressing: Sit to/from stand, Moderate assistance, Maximal assistance Lower Body Dressing Details (indicate cue type and reason): max a to don socks sitting in chair Toilet Transfer: +2 for safety/equipment, Moderate assistance, BSC, Stand-pivot Toilet Transfer Details (indicate cue type and reason): unable to grip RW to assist with transfer, right leg very unstable during transfer Toileting- Clothing Manipulation and Hygiene: Minimal assistance, Sitting/lateral lean Functional mobility during ADLs: Moderate assistance, +2 for safety/equipment General ADL Comments:  Pt extremely motivated and this is a drastic change in function for Pt  Cognition: Cognition Overall Cognitive Status: Within Functional Limits for tasks assessed Orientation Level: Oriented X4 Cognition Arousal/Alertness: Awake/alert Behavior During Therapy: WFL for tasks assessed/performed Overall Cognitive Status: Within Functional Limits for tasks assessed  Blood pressure (!) 146/75, pulse 70, temperature 97.9 F (36.6 C), temperature source Oral, resp. rate 16, height 5\' 3"  (1.6 m), weight 102.1 kg (225 lb 1.6 oz), SpO2 99 %. Physical Exam  Constitutional: She is oriented to person, place, and time. She appears well-developed.  HENT:  Head: Normocephalic.  Eyes: EOM are normal.  Neck: Normal range of motion. Neck supple. No thyromegaly present.  Cardiovascular: Normal rate and regular rhythm.   Respiratory: Effort normal and breath sounds normal. No respiratory distress.  GI: Soft. Bowel sounds are normal. She exhibits no distension.  Neurological: She is alert and oriented to person, place, and time.  Follows full commands. Cognitively  intact. UE motor 4/5 deltoid, biceps, triceps, wrists and hands limited by discomfort. LE's 3 to 4/5 HF, KE, 4/5 ADF/PF. Decreased sensation to LT in both legs,trunk, hands. Displays dysesthesias in hands/legs limiting contract/resistance testing  Skin: Skin is warm and dry.  Psychiatric: She has a normal mood and affect. Her behavior is normal.    Lab Results Last 24 Hours        Results for orders placed or performed during the hospital encounter of 06/14/16 (from the past 24 hour(s))  CK     Status: None   Collection Time: 06/15/16 10:37 AM  Result Value Ref Range   Total CK 59 38 - 234 U/L  Glucose, capillary     Status: Abnormal   Collection Time: 06/15/16 11:59 AM  Result Value Ref Range   Glucose-Capillary 119 (H) 65 - 99 mg/dL   Comment 1 Notify RN    Comment 2 Document in Chart   CSF cell count with differential      Status: None   Collection Time: 06/15/16 12:01 PM  Result Value Ref Range   Tube # 4    Color, CSF COLORLESS COLORLESS   Appearance, CSF CLEAR CLEAR   Supernatant NOT INDICATED    RBC Count, CSF 0 0 /cu mm   WBC, CSF 2 0 - 5 /cu mm   Lymphs, CSF RARE 40 - 80 %   Other Cells, CSF TOO FEW TO COUNT, SMEAR AVAILABLE FOR REVIEW   CSF cell count with differential     Status: Abnormal   Collection Time: 06/15/16 12:03 PM  Result Value Ref Range   Tube # 1    Color, CSF COLORLESS COLORLESS   Appearance, CSF CLEAR CLEAR   Supernatant NOT INDICATED    RBC Count, CSF 12 (H) 0 /cu mm   WBC, CSF 0 0 - 5 /cu mm   Lymphs, CSF FEW 40 - 80 %   Other Cells, CSF TOO FEW TO COUNT, SMEAR AVAILABLE FOR REVIEW   Protein and glucose, CSF     Status: None   Collection Time: 06/15/16 12:03 PM  Result Value Ref Range   Glucose, CSF 60 40 - 70 mg/dL   Total  Protein, CSF 42 15 - 45 mg/dL  CSF culture with Stat gram stain     Status: None (Preliminary result)   Collection Time: 06/15/16  1:58 PM  Result Value Ref Range   Specimen Description CSF    Special Requests NONE    Gram Stain      CYTOSPIN SMEAR WBC PRESENT,BOTH PMN AND MONONUCLEAR NO ORGANISMS SEEN    Culture PENDING    Report Status PENDING   Glucose, capillary     Status: Abnormal   Collection Time: 06/15/16  4:59 PM  Result Value Ref Range   Glucose-Capillary 119 (H) 65 - 99 mg/dL   Comment 1 Notify RN    Comment 2 Document in Chart   Vitamin B12     Status: Abnormal   Collection Time: 06/15/16  6:09 PM  Result Value Ref Range   Vitamin B-12 2,209 (H) 180 - 914 pg/mL  RPR     Status: None   Collection Time: 06/15/16  6:09 PM  Result Value Ref Range   RPR Ser Ql Non Reactive Non Reactive  TSH     Status: None   Collection Time: 06/15/16  6:09 PM  Result Value Ref Range   TSH 0.351 0.350 - 4.500 uIU/mL  Glucose, capillary  Status: Abnormal   Collection Time: 06/15/16   8:56 PM  Result Value Ref Range   Glucose-Capillary 141 (H) 65 - 99 mg/dL   Comment 1 Notify RN    Comment 2 Document in Chart   Glucose, capillary     Status: Abnormal   Collection Time: 06/16/16  6:27 AM  Result Value Ref Range   Glucose-Capillary 135 (H) 65 - 99 mg/dL      Imaging Results (Last 48 hours)  Mr Brain W And Wo Contrast  Result Date: 06/14/2016 CLINICAL DATA:  Bilateral hand numbness, leg heaviness and urinary retention for 4 days. EXAM: MRI HEAD WITHOUT AND WITH CONTRAST TECHNIQUE: Multiplanar, multiecho pulse sequences of the brain and surrounding structures were obtained without and with intravenous contrast. CONTRAST:  25mL MULTIHANCE GADOBENATE DIMEGLUMINE 529 MG/ML IV SOLN COMPARISON:  None. FINDINGS: INTRACRANIAL CONTENTS: No reduced diffusion to suggest acute ischemia or hyperacute demyelination. No susceptibility artifact to suggest hemorrhage. The ventricles and sulci are normal for patient's age. 9 mm ovoid T2 hyperintensity RIGHT parietal white matter with low T1 signal in T2 shine through. 4 additional subcentimeter white matter T2 hyperintense lesions including RIGHT frontal horn periatrial white matter. Linear LEFT parietal perivascular space. No masses, mass effect. No abnormal intraparenchymal or extra-axial enhancement. No abnormal extra-axial fluid collections. No extra-axial masses. VASCULAR: Normal major intracranial vascular flow voids present at skull base. SKULL AND UPPER CERVICAL SPINE: No abnormal sellar expansion. No suspicious calvarial bone marrow signal. Craniocervical junction maintained. SINUSES/ORBITS: The mastoid air-cells and included paranasal sinuses are well-aerated.The included ocular globes and orbital contents are non-suspicious. OTHER: None. IMPRESSION: 5 subcentimeter white matter lesions concerning for chronic demyelination, dominant 9 mm RIGHT parietal lesion. No parenchymal brain volume loss for age nor acute intracranial process.  Electronically Signed   By: Elon Alas M.D.   On: 06/14/2016 21:14   Mr Lumbar Spine W Contrast  Result Date: 06/14/2016 CLINICAL DATA:  Bilateral hand numbness, leg heaviness and urinary retention for 4 days. EXAM: MRI CERVICAL SPINE WITHOUT AND WITH CONTRAST MRI THORACIC SPINE WITHOUT AND WITH CONTRAST MRI LUMBAR SPINE WITH CONTRAST TECHNIQUE: Multisequence MR imaging of the spine from the cervical spine to the thoracolumbar junction was performed prior to and following IV contrast administration for evaluation of demyelinating disease. Multisequence MRI imaging of the lumbar spine obtained without intravenous contrast. CONTRAST:  39mL MULTIHANCE GADOBENATE DIMEGLUMINE 529 MG/ML IV SOLN COMPARISON:  MRI of the lumbar spine without contrast Jun 13, 2016 FINDINGS: MRI CERVICAL SPINE FINDINGS ALIGNMENT: Straightened cervical lordosis.  No malalignment. VERTEBRAE/DISCS: Vertebral bodies are intact. Intervertebral disc morphology's and signal are normal. No abnormal bone marrow signal or acute osseous process. Mild chronic discogenic endplate changes X1-0 and to lesser extent C6-7. No abnormal osseous or disc enhancement. CORD:Expansile 11 mm T2 bright lesion dorsal C3 with faint enhancement. 5 mm nonenhancing lesion within dorsal spinal cord at C5. No myelomalacia or syrinx. No abnormal leptomeningeal or epidural enhancement. POSTERIOR FOSSA, VERTEBRAL ARTERIES, PARASPINAL TISSUES: No MR findings of ligamentous injury. Vertebral artery flow voids present. Included posterior fossa and paraspinal soft tissues are normal. DISC LEVELS: C2-3: No disc bulge, canal stenosis nor neural foraminal narrowing. C3-4, C4-5: Tiny central disc protrusion mild facet arthropathy. No canal stenosis or neural foraminal narrowing. C5-6: Annular bulging without canal stenosis or neural foraminal narrowing. C6-7: Tiny central disc protrusion without canal stenosis or neural foraminal narrowing. C7-T1: No disc bulge, canal  stenosis nor neural foraminal narrowing. MRI THORACIC SPINE FINDINGS- mildly motion degraded  examination. ALIGNMENT: Maintenance of the thoracic kyphosis. No malalignment.No abnormal or acute bone marrow signal. No abnormal osseous or disc enhancement. VERTEBRAE/DISCS: Vertebral bodies are intact. Intervertebral discs morphology and signal are normal. CORD: Thoracic spinal cord is normal morphology and signal characteristics to the level of the conus medullaris. No abnormal cord, leptomeningeal or epidural enhancement. Please note, motion degrades sensitivity for subtle potential cord signal abnormality. PREVERTEBRAL AND PARASPINAL SOFT TISSUES: Multiple nonenhancing T2 bright masses in the liver most compatible with cysts as demonstrated on CT abdomen and pelvis July 24, 2008. DISC LEVELS: T6-7: Small central disc protrusion without canal stenosis or neural foraminal narrowing. No disc bulge, canal stenosis nor neural foraminal narrowing at the remaining thoracic levels. MRI LUMBAR SPINE FINDINGS VERTEBRAE:No abnormal osseous or disc enhancement. CONUS MEDULLARIS: No abnormal cord, leptomeningeal or epidural enhancement. IMPRESSION: MRI CERVICAL SPINE: Acute 11 mm C3 demyelinating lesion. Chronic 5 mm C5 demyelinating lesion. Early degenerative change of the cervical spine without canal stenosis or neural foraminal narrowing. MRI THORACIC SPINE: No MR findings of demyelination in thoracic spinal cord. Small T6-7 disc protrusion without canal stenosis or neural foraminal narrowing at any thoracic level. MRI LUMBAR SPINE: No abnormal enhancement. Electronically Signed   By: Elon Alas M.D.   On: 06/14/2016 21:36   Mr Cervical Spine W Or Wo Contrast  Result Date: 06/14/2016 CLINICAL DATA:  Bilateral hand numbness, leg heaviness and urinary retention for 4 days. EXAM: MRI CERVICAL SPINE WITHOUT AND WITH CONTRAST MRI THORACIC SPINE WITHOUT AND WITH CONTRAST MRI LUMBAR SPINE WITH CONTRAST TECHNIQUE:  Multisequence MR imaging of the spine from the cervical spine to the thoracolumbar junction was performed prior to and following IV contrast administration for evaluation of demyelinating disease. Multisequence MRI imaging of the lumbar spine obtained without intravenous contrast. CONTRAST:  68mL MULTIHANCE GADOBENATE DIMEGLUMINE 529 MG/ML IV SOLN COMPARISON:  MRI of the lumbar spine without contrast Jun 13, 2016 FINDINGS: MRI CERVICAL SPINE FINDINGS ALIGNMENT: Straightened cervical lordosis.  No malalignment. VERTEBRAE/DISCS: Vertebral bodies are intact. Intervertebral disc morphology's and signal are normal. No abnormal bone marrow signal or acute osseous process. Mild chronic discogenic endplate changes Z6-1 and to lesser extent C6-7. No abnormal osseous or disc enhancement. CORD:Expansile 11 mm T2 bright lesion dorsal C3 with faint enhancement. 5 mm nonenhancing lesion within dorsal spinal cord at C5. No myelomalacia or syrinx. No abnormal leptomeningeal or epidural enhancement. POSTERIOR FOSSA, VERTEBRAL ARTERIES, PARASPINAL TISSUES: No MR findings of ligamentous injury. Vertebral artery flow voids present. Included posterior fossa and paraspinal soft tissues are normal. DISC LEVELS: C2-3: No disc bulge, canal stenosis nor neural foraminal narrowing. C3-4, C4-5: Tiny central disc protrusion mild facet arthropathy. No canal stenosis or neural foraminal narrowing. C5-6: Annular bulging without canal stenosis or neural foraminal narrowing. C6-7: Tiny central disc protrusion without canal stenosis or neural foraminal narrowing. C7-T1: No disc bulge, canal stenosis nor neural foraminal narrowing. MRI THORACIC SPINE FINDINGS- mildly motion degraded examination. ALIGNMENT: Maintenance of the thoracic kyphosis. No malalignment.No abnormal or acute bone marrow signal. No abnormal osseous or disc enhancement. VERTEBRAE/DISCS: Vertebral bodies are intact. Intervertebral discs morphology and signal are normal. CORD:  Thoracic spinal cord is normal morphology and signal characteristics to the level of the conus medullaris. No abnormal cord, leptomeningeal or epidural enhancement. Please note, motion degrades sensitivity for subtle potential cord signal abnormality. PREVERTEBRAL AND PARASPINAL SOFT TISSUES: Multiple nonenhancing T2 bright masses in the liver most compatible with cysts as demonstrated on CT abdomen and pelvis July 24, 2008. [place]  LEVELS: T6-7: Small central disc protrusion without canal stenosis or neural foraminal narrowing. No disc bulge, canal stenosis nor neural foraminal narrowing at the remaining thoracic levels. MRI LUMBAR SPINE FINDINGS VERTEBRAE:No abnormal osseous or disc enhancement. CONUS MEDULLARIS: No abnormal cord, leptomeningeal or epidural enhancement. IMPRESSION: MRI CERVICAL SPINE: Acute 11 mm C3 demyelinating lesion. Chronic 5 mm C5 demyelinating lesion. Early degenerative change of the cervical spine without canal stenosis or neural foraminal narrowing. MRI THORACIC SPINE: No MR findings of demyelination in thoracic spinal cord. Small T6-7 disc protrusion without canal stenosis or neural foraminal narrowing at any thoracic level. MRI LUMBAR SPINE: No abnormal enhancement. Electronically Signed   By: Elon Alas M.D.   On: 06/14/2016 21:36   Mr Thoracic Spine W Wo Contrast  Result Date: 06/14/2016 CLINICAL DATA:  Bilateral hand numbness, leg heaviness and urinary retention for 4 days. EXAM: MRI CERVICAL SPINE WITHOUT AND WITH CONTRAST MRI THORACIC SPINE WITHOUT AND WITH CONTRAST MRI LUMBAR SPINE WITH CONTRAST TECHNIQUE: Multisequence MR imaging of the spine from the cervical spine to the thoracolumbar junction was performed prior to and following IV contrast administration for evaluation of demyelinating disease. Multisequence MRI imaging of the lumbar spine obtained without intravenous contrast. CONTRAST:  21mL MULTIHANCE GADOBENATE DIMEGLUMINE 529 MG/ML IV SOLN COMPARISON:  MRI of  the lumbar spine without contrast Jun 13, 2016 FINDINGS: MRI CERVICAL SPINE FINDINGS ALIGNMENT: Straightened cervical lordosis.  No malalignment. VERTEBRAE/DISCS: Vertebral bodies are intact. Intervertebral disc morphology's and signal are normal. No abnormal bone marrow signal or acute osseous process. Mild chronic discogenic endplate changes K9-3 and to lesser extent C6-7. No abnormal osseous or disc enhancement. CORD:Expansile 11 mm T2 bright lesion dorsal C3 with faint enhancement. 5 mm nonenhancing lesion within dorsal spinal cord at C5. No myelomalacia or syrinx. No abnormal leptomeningeal or epidural enhancement. POSTERIOR FOSSA, VERTEBRAL ARTERIES, PARASPINAL TISSUES: No MR findings of ligamentous injury. Vertebral artery flow voids present. Included posterior fossa and paraspinal soft tissues are normal. DISC LEVELS: C2-3: No disc bulge, canal stenosis nor neural foraminal narrowing. C3-4, C4-5: Tiny central disc protrusion mild facet arthropathy. No canal stenosis or neural foraminal narrowing. C5-6: Annular bulging without canal stenosis or neural foraminal narrowing. C6-7: Tiny central disc protrusion without canal stenosis or neural foraminal narrowing. C7-T1: No disc bulge, canal stenosis nor neural foraminal narrowing. MRI THORACIC SPINE FINDINGS- mildly motion degraded examination. ALIGNMENT: Maintenance of the thoracic kyphosis. No malalignment.No abnormal or acute bone marrow signal. No abnormal osseous or disc enhancement. VERTEBRAE/DISCS: Vertebral bodies are intact. Intervertebral discs morphology and signal are normal. CORD: Thoracic spinal cord is normal morphology and signal characteristics to the level of the conus medullaris. No abnormal cord, leptomeningeal or epidural enhancement. Please note, motion degrades sensitivity for subtle potential cord signal abnormality. PREVERTEBRAL AND PARASPINAL SOFT TISSUES: Multiple nonenhancing T2 bright masses in the liver most compatible with cysts as  demonstrated on CT abdomen and pelvis July 24, 2008. DISC LEVELS: T6-7: Small central disc protrusion without canal stenosis or neural foraminal narrowing. No disc bulge, canal stenosis nor neural foraminal narrowing at the remaining thoracic levels. MRI LUMBAR SPINE FINDINGS VERTEBRAE:No abnormal osseous or disc enhancement. CONUS MEDULLARIS: No abnormal cord, leptomeningeal or epidural enhancement. IMPRESSION: MRI CERVICAL SPINE: Acute 11 mm C3 demyelinating lesion. Chronic 5 mm C5 demyelinating lesion. Early degenerative change of the cervical spine without canal stenosis or neural foraminal narrowing. MRI THORACIC SPINE: No MR findings of demyelination in thoracic spinal cord. Small T6-7 disc protrusion without canal stenosis or neural  foraminal narrowing at any thoracic level. MRI LUMBAR SPINE: No abnormal enhancement. Electronically Signed   By: Elon Alas M.D.   On: 06/14/2016 21:36     Assessment/Plan: Diagnosis: Gait deficits,sensory loss related to demyelinating disease of brain and cervico-thoracic cord 1. Does the need for close, 24 hr/day medical supervision in concert with the patient's rehab needs make it unreasonable for this patient to be served in a less intensive setting? Yes 2. Co-Morbidities requiring supervision/potential complications: neurogenic bladder, neurogenic bowel, pain mgt 3. Due to bladder management, bowel management, safety, skin/wound care, disease management, medication administration, pain management and patient education, does the patient require 24 hr/day rehab nursing? Yes 4. Does the patient require coordinated care of a physician, rehab nurse, PT (1-2 hrs/day, 5 days/week) and OT (1-2 hrs/day, 5 days/week) to address physical and functional deficits in the context of the above medical diagnosis(es)? Yes Addressing deficits in the following areas: balance, endurance, locomotion, strength, transferring, bowel/bladder control, bathing, dressing, feeding,  grooming, toileting and psychosocial support 5. Can the patient actively participate in an intensive therapy program of at least 3 hrs of therapy per day at least 5 days per week? Yes 6. The potential for patient to make measurable gains while on inpatient rehab is excellent 7. Anticipated functional outcomes upon discharge from inpatient rehab are modified independent  with PT, modified independent with OT, n/a with SLP. 8. Estimated rehab length of stay to reach the above functional goals is: 17-20 days 9. Anticipated D/C setting: Home 10. Anticipated post D/C treatments: HH therapy and Outpatient therapy 11. Overall Rehab/Functional Prognosis: excellent  RECOMMENDATIONS: This patient's condition is appropriate for continued rehabilitative care in the following setting: CIR Patient has agreed to participate in recommended program. Yes Note that insurance prior authorization may be required for reimbursement for recommended care.  Comment: Pt is extremely motivated. Rehab Admissions Coordinator to follow up.  Thanks,  [name], MD, Mellody Drown    Cathlyn Parsons., PA-C 06/16/2016    Revision History                        Routing History

## 2016-06-20 NOTE — Progress Notes (Signed)
Social Work Assessment and Plan Social Work Assessment and Plan  Patient Details  Name: Tamara [name] MRN: [id number] Date of Birth: 02-21-1960  Today's Date: 06/20/2016  Problem List:  Patient Active Problem List   Diagnosis Date Noted  . Multiple sclerosis ([place]) 06/19/2016  . Neuropathic pain   . Neurologic gait disorder   . Neurogenic bowel   . Neurogenic bladder   . Anemia of chronic disease   . Leukocytosis   . Steroid-induced hyperglycemia   . 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 Allergic rhinitis 04/12/2016  . Medication overuse headache 03/17/2016  . Elevated blood pressure reading in office without diagnosis of hypertension 03/13/2016  . Otitis media 02/23/2014  . Sciatica associated with disorder of lumbosacral spine 01/15/2014  . Edema 01/15/2014  . Headache(784.0) 07/21/2013  . GERD (gastroesophageal reflux disease) 07/21/2013  . RBBB on EKG 08/01/2012  . IBS (irritable bowel syndrome) 06/19/2012  . Healthcare maintenance 06/19/2012   Past Medical History:  Past Medical History:  Diagnosis Date  . Anemia   . Bowel obstruction ([place])   . GERD (gastroesophageal reflux disease)   . IBS (irritable bowel syndrome)    at age of 24  . Sciatica 2009   Past Surgical History:  Past Surgical History:  Procedure Laterality Date  . ABDOMINAL HYSTERECTOMY  2008   cervix and right ovary still intact  . KNEE ARTHROSCOPY  2010 and 2011   Left knee, x2   Social History:  reports that she has never smoked. She has never used smokeless tobacco. She reports that she does not drink alcohol or use drugs.  Family / Support Systems Marital Status: Married Patient Roles: Spouse, Parent, Caregiver Spouse/Significant Other: Remo Lipps [phone] Children: Sacia-daughter [phone] Other Supports: friends and church members Anticipated Caregiver: Husband can be there-supervision level and daughter's can  help some. Ability/Limitations of Caregiver: Husband disabled with cardiac issues and daughter's both work and can help some after work Building control surveyor Availability: Other (Comment) (husband is there with her but can not physically assist) Family Dynamics: Close knit family who are there for one another and will assist. Pt is usually the one who is the caregiver of others and now is the one who needs care. Pt reports this is a new role for her and one she does not like.   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   Abuse/Neglect Physical Abuse: Denies Verbal Abuse: Denies Sexual Abuse: Denies Exploitation of patient/patient's resources: Denies Self-Neglect: Denies  Emotional Status Pt's affect, behavior adn adjustment status: Pt is motviated to do well and become as independent as possible and back to as close as she was prior to admission. She was pleased with her first day of therapy and how well it went. She hopes each day it will continue to get better. Recent Psychosocial Issues: other health issues she was managing along with caring for husband and granddaughter. Pyschiatric History: No history deferred depression screen due to appears to be coping appropriately and able to verbalize her concerns. But will make referral to neuro-psych due to new diagnosis-MS and adjusting to this.  Substance Abuse History: No issues  Patient / Family Perceptions, Expectations & Goals Pt/Family understanding of illness & functional limitations: Pt and  husband have spoken with the MD and feel they have a basic understanding but need more information regarding MS and plan to look up information on-line. Will ask Rn and MD to provide more information to pt and husband. Premorbid pt/family roles/activities: Wife, Mother, grandmother, caregiver, self employed, friend, etc Anticipated changes in roles/activities/participation: resume Pt/family expectations/goals: Pt states: " I want to be able to do for myself rather than someone doing for me." Husband states: " I can do some but am limited due to my own health issues."  [employer]: None Premorbid Home Care/DME Agencies: None Transportation available at discharge: Husband and family Resource referrals recommended: Neuropsychology, Support group (specify)  Discharge Planning Living Arrangements: Spouse/significant other Support Systems: Spouse/significant other, Children, Other relatives, Friends/neighbors, Social worker community Type of Residence: Private residence Insurance Resources: Multimedia programmer (specify) Nurse, mental health) Financial Resources: Family Support Financial Screen Referred: No Living Expenses: Lives with family Money Management: Spouse, Patient Does the patient have any problems obtaining your medications?: No Home Management: Patient Patient/Family Preliminary Plans: Return home with husband who can assist some but basically can only provide supervision level. They continue to provide care to their two year old granddaughter while her mom-pt's daughter works. Will await therapy team's evaluations and work on a safe discharge plan for pt. Social Work Anticipated Follow Up Needs: HH/OP, Support Group  Clinical Impression Pleasant motivated pt who has goals she wants to reach by discharge. She is one who is usually in charge and the caregiver's of others, so this is a new role for her. Supportive husband and daughter's. Would benefit from seeing neuro-psych  since new diagnosis of MS. Will await team's evaluations and work on a safe plan.  Elease Hashimoto 06/20/2016, 1:51 PM

## 2016-06-20 NOTE — Evaluation (Signed)
Occupational Therapy Assessment and Plan  Patient Details  Name: Tamara [name] MRN: [id number] Date of Birth: 02/01/1960  OT Diagnosis: acute pain and muscle weakness (generalized) Rehab Potential: Rehab Potential (ACUTE ONLY): Good ELOS: 7-10 days   Today's Date: 06/20/2016 OT Individual Time: 0945-1100 1400-1505 OT Individual Time Calculation (min): 75 min and 65 min     Problem List:  Patient Active Problem List   Diagnosis Date Noted  . Multiple sclerosis ([place]) 06/19/2016  . Neuropathic pain   . Neurologic gait disorder   . Neurogenic bowel   . Neurogenic bladder   . Anemia of chronic disease   . Leukocytosis   . Steroid-induced hyperglycemia   . Acute transverse myelitis in demyelinating disease of central nervous system ([place]) 06/14/2016  . Allergic rhinitis 04/12/2016  . Medication overuse headache 03/17/2016  . Elevated blood pressure reading in office without diagnosis of hypertension 03/13/2016  . Otitis media 02/23/2014  . Sciatica associated with disorder of lumbosacral spine 01/15/2014  . Edema 01/15/2014  . Headache(784.0) 07/21/2013  . GERD (gastroesophageal reflux disease) 07/21/2013  . RBBB on EKG 08/01/2012  . IBS (irritable bowel syndrome) 06/19/2012  . Healthcare maintenance 06/19/2012    Past Medical History:  Past Medical History:  Diagnosis Date  . Anemia   . Bowel obstruction ([place])   . GERD (gastroesophageal reflux disease)   . IBS (irritable bowel syndrome)    at age of 56  . Sciatica 2009   Past Surgical History:  Past Surgical History:  Procedure Laterality Date  . ABDOMINAL HYSTERECTOMY  2008   cervix and right ovary still intact  . KNEE ARTHROSCOPY  2010 and 2011   Left knee, x2    Assessment & Plan Clinical Impression: Tamara [name] a 56 y.o.right handed femalewith history of sciatica, irritable bowel syndrome, chronic anemia. Per chart review patient lives with spouse. Independent prior to admission. 2 level home with bedroom on  first floor and 4 steps to entry. Husband is disabled. She presented 06/14/2016 with progressive numbness involving her trunk from the chest including lower extremities as well as numbness involving both hands. MRI of the brain reviewed, showing demyelination.  Per report, 5 cm white matter lesion concerning for chronic demyelination, dominant 9 mm right parietal lesion. MRI cervical thoracic and lumbar spine shows acute 11 mm C3 demyelinating lesion. Chronic 5 mm C5 demyelinating lesion. No MR findings of demyelination and thoracic spinal cord or lumbar spine. CSF,VZV titers negative. Placed on IV Solu-Medrol per neurology services suspect cervical myelitis/multiple sclerosis and plan follow-up outpatient with Dr. Serita Grit of neurology services. Subcutaneous Lovenox for DVT prophylaxis. Physical and occupational therapy evaluation completed with recommendations of physical medicine rehabilitation consult.Patient was admitted for a comprehensive rehabilitation program  Patient transferred to CIR on 06/19/2016 .    Patient currently requires min with basic self-care skills secondary to muscle weakness, decreased cardiorespiratoy endurance, and decreased sitting balance, decreased standing balance, decreased postural control and decreased balance strategies.  Prior to hospitalization, patient could complete ADLs/IADLs with independent .  Patient will benefit from skilled intervention to decrease level of assist with basic self-care skills, increase independence with basic self-care skills and increase level of independence with iADL prior to discharge home with care partner.  Anticipate patient will require intermittent supervision and follow up home health.  OT - End of Session Activity Tolerance: Tolerates 10 - 20 min activity with multiple rests Endurance Deficit: Yes OT Assessment Rehab Potential (ACUTE ONLY): Good OT Patient demonstrates impairments in the  following area(s):  Balance;Edema;Endurance;Safety;Perception;Pain;Motor;Sensory OT Basic ADL's Functional Problem(s): Eating;Grooming;Bathing;Dressing;Toileting OT Advanced ADL's Functional Problem(s): Simple Meal Preparation;Laundry;Light Housekeeping OT Transfers Functional Problem(s): Toilet;Tub/Shower OT Additional Impairment(s): Fuctional Use of Upper Extremity OT Plan OT Intensity: Minimum of 1-2 x/day, 45 to 90 minutes OT Frequency: 5 out of 7 days OT Duration/Estimated Length of Stay: 7-10 days OT Treatment/Interventions: Balance/vestibular training;Discharge planning;Community reintegration;DME/adaptive equipment instruction;Functional mobility training;Neuromuscular re-education;Pain management;Psychosocial support;Patient/family education;Self Care/advanced ADL retraining;Therapeutic Activities;Therapeutic Exercise;UE/LE Strength taining/ROM;UE/LE Coordination activities OT Self Feeding Anticipated Outcome(s): Mod I OT Basic Self-Care Anticipated Outcome(s): Mod I OT Toileting Anticipated Outcome(s): Mod I OT Bathroom Transfers Anticipated Outcome(s): Supervision- mod I OT Recommendation Recommendations for Other Services: Neuropsych consult;Therapeutic Recreation consult Therapeutic Recreation Interventions: Clinical cytogeneticist;Outing/community reintergration Patient destination: Home Follow Up Recommendations: Outpatient OT Equipment Recommended: Tub/shower seat;To be determined   Skilled Therapeutic Intervention Session One: Pt seen for OT eval and tx session focusing on education, fine motor coordination, and functional mobility/ ambulation. Pt standing in room unattended upon arrival, educated regarding need to call for assist with any mobility, she voiced understanding.  She denied bathing/dressing this morning, opting to complete during PM session.  She completed grooming tasks at sink, sit <> stand to address dynamic standing balance and endurance. Significantly increased time  required for fine motor tasks required for set-up of grooming. Completed 9 hole peg test, see results below. She ambulated into bathroom with HHA and min-mod A, ambulated out of bathroom with RW and min A. Recommend pt use RW when ambulating with nursing staff. She completed toileting tasks with steadying assist.  Pt left seated EOB at end of session, set-up with meal tray and all needs in reach. Husband just arriving. Pt educated throughout session regarding role of OT, POC, SCI, continuum of care, OT/PT goals, and d/c planning.   Session Two: Pt seen for OT ADL bathing/dressing session. Pt received in supine upon arrival, agreeable to tx session. Ambulated throughout session with CGA using RW with cuing for step length and RW management in functional context. She bathed seated on shower chair, standing with heavy reliance on grab bars to complete pericare/ buttock hygiene. She dressed seated on toilet, required increased time and concentration for management of clothing and for wash cloth while bathing due to decreased coordination in B UEs.  She returned to recliner at end of session, left seated with friend present. Pt unable to reach call bell from desired sitting location. NT made aware of location.    OT Evaluation Precautions/Restrictions  Precautions Precautions: Fall Restrictions Weight Bearing Restrictions: No General Chart Reviewed: Yes Home Living/Prior Functioning Home Living Living Arrangements: Spouse/significant other Available Help at Discharge: Family, Available 24 hours/day Type of Home: House Home Access: Stairs to enter [employer] of Steps: 3 step without rails and step up to get into home Entrance Stairs-Rails: None Home Layout: Two level, Able to live on main level with bedroom/bathroom Alternate Level Stairs-Number of Steps: flight Alternate Level Stairs-Rails: Right Bathroom Shower/Tub: Multimedia programmer: Handicapped height Bathroom  Accessibility: Yes Additional Comments: husband is disabled (heart disease), Pt cares for her 29 year old grandaughter  Lives With: Spouse Prior Function Level of Independence: Independent with basic ADLs, Independent with homemaking with ambulation, Independent with gait, Independent with transfers Comments: Pt assists with taking care of her husband who is on disability for heart condition. She also has a 52 year old granddaughter. and enjoys cake decorating Vision Baseline Vision/History: Wears glasses Wears Glasses: At all times Patient Visual Report: No  change from baseline Vision Assessment?: No apparent visual deficits Perception  Perception: Within Functional Limits Praxis Praxis: Intact Cognition Overall Cognitive Status: History of cognitive impairments - at baseline Arousal/Alertness: Awake/alert Orientation Level: Person;Place;Situation Person: Oriented Place: Oriented Situation: Oriented Year: 2018 Month: May Day of Week: Correct Memory: Impaired Memory Impairment: Decreased short term memory Decreased Short Term Memory: Verbal complex;Functional complex Immediate Memory Recall: Sock;Blue;Bed Memory Recall: Sock;Blue;Bed Memory Recall Sock: Without Cue Memory Recall Blue: Without Cue Memory Recall Bed: Without Cue Awareness: Appears intact Problem Solving: Appears intact Safety/Judgment: Appears intact Sensation Sensation Light Touch: Impaired Detail Light Touch Impaired Details: Impaired RUE;Impaired LUE;Impaired RLE;Impaired LLE Stereognosis: Impaired Detail Stereognosis Impaired Details: Impaired RUE;Impaired LUE Hot/Cold: Impaired by gross assessment Proprioception: Impaired by gross assessment Proprioception Impaired Details: Impaired LLE;Impaired RLE;Impaired RUE;Impaired LUE Coordination Gross Motor Movements are Fluid and Coordinated: No Fine Motor Movements are Fluid and Coordinated: No Coordination and Movement Description: Decreased coordination  and control Finger Nose Finger Test: Decreased speed B UEs 9 Hole Peg Test: R: 2 min 17 seconds L:3 min 59 seconds Motor  Motor Motor: Ataxia;Abnormal postural alignment and control;Other (comment) Motor - Skilled Clinical Observations: sensory impairments significantly affecting mobility as well; generalized weakness Trunk/Postural Assessment  Cervical Assessment Cervical Assessment: Within Functional Limits Thoracic Assessment Thoracic Assessment: Within Functional Limits Lumbar Assessment Lumbar Assessment: Within Functional Limits Postural Control Postural Control: Deficits on evaluation Trunk Control: impaired ; strength and sensory affecting Protective Responses: delayed and inadequate  Balance Balance Balance Assessed: Yes Static Sitting Balance Static Sitting - Level of Assistance: 6: Modified independent (Device/Increase time) Dynamic Sitting Balance Dynamic Sitting - Balance Support: During functional activity;Feet supported Dynamic Sitting - Level of Assistance: 5: Stand by assistance Sitting balance - Comments: Sitting EOB to complete functional tasks Static Standing Balance Static Standing - Balance Support: During functional activity;Right upper extremity supported;Left upper extremity supported Static Standing - Level of Assistance: 4: Min assist Dynamic Standing Balance Dynamic Standing - Balance Support: During functional activity;Right upper extremity supported;Left upper extremity supported Dynamic Standing - Level of Assistance: 4: Min assist;3: Mod assist Dynamic Standing - Comments: Standing to complete LB bathing/dressing and toileting tasks Extremity/Trunk Assessment RUE Assessment RUE Assessment: Within Functional Limits LUE Assessment LUE Assessment: Within Functional Limits   See Function Navigator for Current Functional Status.   Refer to Care Plan for Long Term Goals  Recommendations for other services: Neuropsych and Therapeutic Recreation   Kitchen group and Outing/community reintegration   Discharge Criteria: Patient will be discharged from OT if patient refuses treatment 3 consecutive times without medical reason, if treatment goals not met, if there is a change in medical status, if patient makes no progress towards goals or if patient is discharged from hospital.  The above assessment, treatment plan, treatment alternatives and goals were discussed and mutually agreed upon: by patient  Ernestina Patches 06/20/2016, 3:25 PM

## 2016-06-20 NOTE — Progress Notes (Signed)
Subjective/Complaints: No issues overnite, took >1hr to wash up due to hand tingling and numbness ROS- no CP, SOB, N/V/D, last BM 2 d ago, bladder ok Objective: Vital Signs: Blood pressure 116/72, pulse (!) 53, temperature 98.1 F (36.7 C), temperature source Oral, resp. rate 17, SpO2 99 %. No results found. Results for orders placed or performed during the hospital encounter of 06/19/16 (from the past 72 hour(s))  CBC     Status: Abnormal   Collection Time: 06/19/16  7:56 PM  Result Value Ref Range   WBC 12.6 (H) 4.0 - 10.5 K/uL   RBC 4.47 3.87 - 5.11 MIL/uL   Hemoglobin 12.5 12.0 - 15.0 g/dL   HCT 38.9 36.0 - 46.0 %   MCV 87.0 78.0 - 100.0 fL   MCH 28.0 26.0 - 34.0 pg   MCHC 32.1 30.0 - 36.0 g/dL   RDW 14.7 11.5 - 15.5 %   Platelets 199 150 - 400 K/uL  Creatinine, serum     Status: None   Collection Time: 06/19/16  7:56 PM  Result Value Ref Range   Creatinine, Ser 0.87 0.44 - 1.00 mg/dL   GFR calc non Af Amer >60 >60 mL/min   GFR calc Af Amer >60 >60 mL/min    Comment: (NOTE) The eGFR has been calculated using the CKD EPI equation. This calculation has not been validated in all clinical situations. eGFR's persistently <60 mL/min signify possible Chronic Kidney Disease.   CBC WITH DIFFERENTIAL     Status: Abnormal   Collection Time: 06/20/16  5:45 AM  Result Value Ref Range   WBC 9.8 4.0 - 10.5 K/uL   RBC 4.09 3.87 - 5.11 MIL/uL   Hemoglobin 11.3 (L) 12.0 - 15.0 g/dL   HCT 35.8 (L) 36.0 - 46.0 %   MCV 87.5 78.0 - 100.0 fL   MCH 27.6 26.0 - 34.0 pg   MCHC 31.6 30.0 - 36.0 g/dL   RDW 14.7 11.5 - 15.5 %   Platelets 161 150 - 400 K/uL   Neutrophils Relative % 66 %   Neutro Abs 6.5 1.7 - 7.7 K/uL   Lymphocytes Relative 24 %   Lymphs Abs 2.3 0.7 - 4.0 K/uL   Monocytes Relative 10 %   Monocytes Absolute 0.9 0.1 - 1.0 K/uL   Eosinophils Relative 0 %   Eosinophils Absolute 0.0 0.0 - 0.7 K/uL   Basophils Relative 0 %   Basophils Absolute 0.0 0.0 - 0.1 K/uL   Comprehensive metabolic panel     Status: Abnormal   Collection Time: 06/20/16  5:45 AM  Result Value Ref Range   Sodium 140 135 - 145 mmol/L   Potassium 3.7 3.5 - 5.1 mmol/L   Chloride 104 101 - 111 mmol/L   CO2 30 22 - 32 mmol/L   Glucose, Bld 85 65 - 99 mg/dL   BUN 15 6 - 20 mg/dL   Creatinine, Ser 0.87 0.44 - 1.00 mg/dL   Calcium 8.1 (L) 8.9 - 10.3 mg/dL   Total Protein 5.1 (L) 6.5 - 8.1 g/dL   Albumin 2.7 (L) 3.5 - 5.0 g/dL   AST 14 (L) 15 - 41 U/L   ALT 20 14 - 54 U/L   Alkaline Phosphatase 47 38 - 126 U/L   Total Bilirubin 0.4 0.3 - 1.2 mg/dL   GFR calc non Af Amer >60 >60 mL/min   GFR calc Af Amer >60 >60 mL/min    Comment: (NOTE) The eGFR has been calculated using the CKD  EPI equation. This calculation has not been validated in all clinical situations. eGFR's persistently <60 mL/min signify possible Chronic Kidney Disease.    Anion gap 6 5 - 15     HEENT: normal Cardio: RRR and no murmur Resp: RRR and no murmur GI: BS positive and NT, ND Extremity:  Pulses positive and No Edema Skin:   Intact Neuro: Alert/Oriented, Cranial Nerve II-XII normal, Abnormal Sensory reduced in BLE and B hands to LT, Abnormal Motor 4/5 in BUE, 3/5 in bilateral knee ext, 4/5 in ankle DF, Abnormal FMC Ataxic/ dec FMC and Reflexes: 3+ Musc/Skel:  Other no pain with UE or LE ROM Gen NAD   Assessment/Plan: 1. Functional deficits secondary to CNS inflammatory demyelinating disease which require 3+ hours per day of interdisciplinary therapy in a comprehensive inpatient rehab setting. Physiatrist is providing close team supervision and 24 hour management of active medical problems listed below. Physiatrist and rehab team continue to assess barriers to discharge/monitor patient progress toward functional and medical goals. FIM:          Function - Air cabin crew transfer assistive device: Grab bar, Walker Assist level to toilet: No Help, no cues, assistive device, takes more  than a reasonable amount of time Assist level from toilet: No Help, no cues, assistive device, takes more than a reasonable amount of time                       Medical Problem List and Plan: 1.  Gait deficits, sensory loss related to demyelinating disease secondary to multiple sclerosis versus cervical myelitis. IV Solu-Medrol completed prednisone taper As directed with 50 mg daily 1 day then decrease by 5 mg each day until completed Initiate therapy PT, OT today 2.  DVT Prophylaxis/Anticoagulation: Subcutaneous Lovenox. Check vascular study 3. Pain Management including neuropathic pain: Ultram as needed 4. Mood: Provide emotional support 5. Neuropsych: This patient is capable of making decisions on her own behalf. 6. Skin/Wound Care: Routine skin checks 7. Fluids/Electrolytes/Nutrition: Routine I&O with follow-up chemistries 8. Neurogenic bowel and bladder. Establish bowel program. Check PVR 3 9. Chronic anemia. Follow-up CBC, Hgb  stable 10. Leukocytosis: Resolved likely steroid, afebrile 11. Steroid induced hyperglycemia: Cont to monitor 12.  Hypoalbuminemia- prostat LOS (Days) 1 A FACE TO FACE EVALUATION WAS PERFORMED  KIRSTEINS,ANDREW E 06/20/2016, 8:30 AM
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
## 2016-06-20 NOTE — Care Management Note (Signed)
Inpatient Rehabilitation Center Individual Statement of Services  Patient Name:  Tamara [name]  Date:  06/20/2016  Welcome to the [facility] [place].  Our goal is to provide you with an individualized program based on your diagnosis and situation, designed to meet your specific needs.  With this comprehensive rehabilitation program, you will be expected to participate in at least 3 hours of rehabilitation therapies Monday-Friday, with modified therapy programming on the weekends.  Your rehabilitation program will include the following services:  Physical Therapy (PT), Occupational Therapy (OT), 24 hour per day rehabilitation nursing, Therapeutic Recreaction (TR), Neuropsychology, Case Management (Social Worker), Rehabilitation Medicine, Nutrition Services and Pharmacy Services  Weekly team conferences will be held on Wednesday to discuss your progress.  Your Social Worker will talk with you frequently to get your input and to update you on team discussions.  Team conferences with you and your family in attendance may also be held.  Expected length of stay: 8-10 days  Overall anticipated outcome: supervision-mod/i level  Depending on your progress and recovery, your program may change. Your Social Worker will coordinate services and will keep you informed of any changes. Your Social Worker's name and contact numbers are listed  below.  The following services may also be recommended but are not provided by the [place] will be made to provide these services after discharge if needed.  Arrangements include referral to agencies that provide these services.  Your insurance has been verified to be:  [place] Your primary doctor is:  [name]  Pertinent information will be shared with your doctor and your insurance company.  Social Worker:  Ovidio Kin, [place] or [phone]  Information discussed with and copy given to patient by: Elease Hashimoto, 06/20/2016, 10:49 AM

## 2016-06-20 NOTE — Progress Notes (Signed)
*  PRELIMINARY RESULTS* Vascular Ultrasound Lower extremity venous duplex has been completed.  Preliminary findings: No evidence of DVT or baker's cyst.    Landry Mellow, RDMS, RVT  06/20/2016, 11:38 AM

## 2016-06-20 NOTE — Progress Notes (Signed)
[facility] Admission Coordinator Signed Physical Medicine and Rehabilitation  PMR Pre-admission Date of Service: 06/18/2016 1:13 PM  Related encounter: ED to Hosp-Admission (Discharged) from 06/14/2016 in [facility]       [] Hide copied text PMR Admission Coordinator Pre-Admission Assessment  Patient: Tamara [name] is an 56 y.o., female MRN: [id number] DOB: [birth year] Height: 5\' 3"  (160 cm) Weight: 102.1 kg (225 lb 1.6 oz)                                                                                                                                                  Insurance Information HMO:     PPO: X     PCP:      IPA:      80/20:      OTHER: [employer]  PRIMARY: BCBS      Policy#: [id number]      Subscriber: Self CM Name: Dahlia Bailiff    Phone#:  [phone]     Fax#:  [phone] Pre-Cert#:  [id number], approved for 10 days thru 06/28/16 with clinical updates due 06/28/16      Employer: self employed Benefits:  Phone #: [phone]     Name: verified online Eff. Date: 01/31/16     Deduct: $400      Out of Pocket Max: $800(included deductible, co-pay, & co-insurance       Life Max: None CIR: 70%/30%      SNF: 70%/30% Outpatient: PT/OT     Co-Pay: $20 per visit  Home Health: 70%      Co-Pay: 30% DME: 70%     Co-Pay: 30% Providers: in network  Medicaid Application Date:       Case Manager:  Disability Application Date:       Case Worker:   Emergency Tax adviser Information    Name Relation Home Work Mobile   [place], [place] [phone]  [phone]     Current Medical History  Patient Admitting Diagnosis:Gait deficits,sensory loss related to demyelinating disease of brain and cervico-thoracic cord   History of Present Illness:  Self employed  Past Medical History      Past Medical History:  Diagnosis Date  . Anemia   . Bowel obstruction ([place])   . GERD (gastroesophageal reflux  disease)   . IBS (irritable bowel syndrome)    at age of 55  . Sciatica 2009    Family History  family history includes Cancer in her father; Gallbladder disease in her father, paternal grandmother, and sister; Hypertension in her brother.  Prior Rehab/Hospitalizations:  Has the patient had major surgery during 100 days prior to admission? No  Current Medications   Current Facility-Administered Medications:  .  acetaminophen (TYLENOL) tablet 650 mg, 650 mg, Oral, Q6H PRN, Asencion Partridge, MD, 650 mg at  06/15/16 0120 .  docusate sodium (COLACE) capsule 100 mg, 100 mg, Oral, Daily, Annia Belt, MD, 100 mg at 06/18/16 0957 .  docusate sodium (COLACE) capsule 100 mg, 100 mg, Oral, BID PRN, Shela Leff, MD .  enoxaparin (LOVENOX) injection 40 mg, 40 mg, Subcutaneous, Q24H, Holley Raring, MD, 40 mg at 06/17/16 1721 .  insulin aspart (novoLOG) injection 0-5 Units, 0-5 Units, Subcutaneous, QHS, Hulen Luster, Diana M, MD .  insulin aspart (novoLOG) injection 0-9 Units, 0-9 Units, Subcutaneous, TID WC, Norman Herrlich, MD, 1 Units at 06/17/16 1700 .  pantoprazole (PROTONIX) EC tablet 40 mg, 40 mg, Oral, Daily, Norman Herrlich, MD, 40 mg at 06/18/16 0957 .  predniSONE (DELTASONE) tablet 60 mg, 60 mg, Oral, Q breakfast, Shela Leff, MD, 60 mg at 06/18/16 0856 .  traMADol (ULTRAM) tablet 50-100 mg, 50-100 mg, Oral, Q6H PRN, Milagros Loll, MD, 50 mg at 06/18/16 1102  Patients Current Diet: Diet regular Room service appropriate? Yes; Fluid consistency: Thin  Precautions / Restrictions Precautions Precautions: Fall Restrictions Weight Bearing Restrictions: No   Has the patient had 2 or more falls or a fall with injury in the past year?No, 1 fall that patient reported as a trip that resulted in no injuries   Prior Activity Level Community (5-7x/wk): Patient with multiple medical issues in her past; however, just prior to this she was fully independent with increased  time for rest breaks and was caring for her spouse.  Over the last couple months she has noticed a slow decline that lead to this hospitalization.   Home Assistive Devices / Equipment Home Assistive Devices/Equipment: None Home Equipment: Hand held shower head, Grab bars - tub/shower  Prior Device Use: Indicate devices/aids used by the patient prior to current illness, exacerbation or injury? None of the above  Prior Functional Level Prior Function Level of Independence: Independent Comments: pt states that she cares for her husband and babysits her granddaughter every day. She enjoys painting and decorating cakes  Self Care: Did the patient need help bathing, dressing, using the toilet or eating? Independent  Indoor Mobility: Did the patient need assistance with walking from room to room (with or without device)? Independent  Stairs: Did the patient need assistance with internal or external stairs (with or without device)? Independent, but only did them about once a month to conserve energy  Functional Cognition: Did the patient need help planning regular tasks such as shopping or remembering to take medications? Independent  Current Functional Level Cognition  Overall Cognitive Status: Within Functional Limits for tasks assessed Orientation Level: Oriented X4    Extremity Assessment (includes Sensation/Coordination)  Upper Extremity Assessment: RUE deficits/detail, LUE deficits/detail RUE Deficits / Details: Cannot withstand any resitance in MMT, able to move against gravity. Able to make a full fist, and full digit extension, but strength is 3 Pt requires built up handles to use grooming tools, Pt able to perform finger to thumb with increased time, only one hand at a time. RUE Sensation: decreased light touch (from wrist down) RUE Coordination: decreased fine motor, decreased gross motor LUE Deficits / Details: Cannot withstand any resitance in MMT, able to move against  gravity. Able to make a full fist, and full digit extension, but strength overall a 3 Pt requires built up handles to use grooming tools, Pt able to perform finger to thumb with increased time, only one hand at a time,  LUE Sensation: decreased light touch LUE Coordination: decreased fine motor, decreased gross motor  Lower Extremity Assessment: Defer to PT evaluation RLE Sensation: decreased light touch RLE Coordination: decreased fine motor, decreased gross motor LLE Sensation: decreased light touch LLE Coordination: decreased fine motor, decreased gross motor    ADLs  Overall ADL's : Needs assistance/impaired Eating/Feeding: Set up, With adaptive utensils, Cueing for compensatory techinques, Sitting Grooming: Wash/dry hands, Sitting, Set up Grooming Details (indicate cue type and reason): used wash cloth to build up handles of toothbrush for oral care. Pt require BUE to perform. Pt required assist to maintain control of washcloth during face washing. Upper Body Bathing: Sitting, Minimal assistance Upper Body Bathing Details (indicate cue type and reason): Pt unable to maintain grip on washcloth during UB bathing, she required assist for her back Lower Body Bathing: Moderate assistance, Sitting/lateral leans, With adaptive equipment Lower Body Bathing Details (indicate cue type and reason): BLE very sensative to touch, Pt's face visually grimacing during cleaning. Pt able to get thighs, but required assist for the knees down Upper Body Dressing : Set up, Sitting Lower Body Dressing: Sit to/from stand, Moderate assistance, Maximal assistance Lower Body Dressing Details (indicate cue type and reason): max a to don socks sitting in chair Toilet Transfer: Minimal assistance, Ambulation, RW, BSC (over toilet) Toilet Transfer Details (indicate cue type and reason): cues for hand placement Toileting- Clothing Manipulation and Hygiene: Min guard, Sit to/from stand Functional mobility during ADLs:  Minimal assistance, Rolling walker General ADL Comments: pt with improved UB strength with ability to guide walker without assist and use effectively for pushing up from chair and BSC as well as to wash her hands     Mobility  Overal bed mobility: Needs Assistance Bed Mobility: Supine to Sit Supine to sit: Min guard Sit to supine: Min guard General bed mobility comments: pt in chair    Transfers  Overall transfer level: Needs assistance Equipment used: Rolling walker (2 wheeled) Transfers: Sit to/from Stand Sit to Stand: Min assist Stand pivot transfers: Mod assist, +2 safety/equipment General transfer comment: cues for hand placement, steadying assist    Ambulation / Gait / Stairs / Wheelchair Mobility  Ambulation/Gait Ambulation/Gait assistance: Min assist, +2 physical assistance, Mod assist, +2 safety/equipment Ambulation Distance (Feet): 100 Feet Assistive device: Rolling walker (2 wheeled) Gait Pattern/deviations: Step-through pattern General Gait Details: pt unable to coordinate gait L LE or R LE.  Characterized by varying step lengths, heel/toe or foot flat contact.  Pt tended toward narrowed BOS when unable to look at her feet.  Very little knee buckling and pt was strong enough to overcome a gross misstep. Gait velocity interpretation: Below normal speed for age/gender    Posture / Balance Dynamic Sitting Balance Sitting balance - Comments: worked on unsupported sitting with reaching and w/shifting--adding challenge to sitting Balance Overall balance assessment: Needs assistance Sitting-balance support: Feet supported Sitting balance-Leahy Scale: Good Sitting balance - Comments: worked on unsupported sitting with reaching and w/shifting--adding challenge to sitting Standing balance support: During functional activity, Bilateral upper extremity supported Standing balance-Leahy Scale: Poor Standing balance comment: reliant on walker for ambulation    Special  needs/care consideration BiPAP/CPAP: No CPM: No Continuous Drip IV: No Dialysis: No         Life Vest: No Oxygen: No Special Bed: No Trach Size: No Wound Vac (area): No       Skin: Per patient report she bruises easily; WDL  Bowel mgmt: 06/17/16 patient now unable to feel when she needs to go and just happens to be continent with BM when she sits to urinate Bladder mgmt: Continent, because she now just feels pressure with urgency, and difficulty emptying per her report  Diabetic mgmt: No     Previous Home Environment Living Arrangements: Spouse/significant other Available Help at Discharge: Family, Available 24 hours/day Type of Home: House Home Layout: Two level, Able to live on main level with bedroom/bathroom Alternate Level Stairs-Rails: Right Alternate Level Stairs-Number of Steps: flight Home Access: Stairs to enter Entrance Stairs-Rails: None Entrance Stairs-Number of Steps: 4 Bathroom Shower/Tub: Multimedia programmer: Handicapped height Bathroom Accessibility: Yes How Accessible: Accessible via wheelchair [place]: No Additional Comments: husband is disabled (heart disease), Pt cares for her 52 year old grandaughter  Discharge Living Setting Plans for Discharge Living Setting: Patient's home, Lives with (comment) (Spouse ) Type of Home at Discharge: House Discharge Home Layout: Two level, Able to live on main level with bedroom/bathroom Alternate Level Stairs-Rails: Right Alternate Level Stairs-Number of Steps: 16-18 Discharge Home Access: Stairs to enter Entrance Stairs-Rails: None Entrance Stairs-Number of Steps: 4 Discharge Bathroom Shower/Tub: Walk-in shower Discharge Bathroom Toilet: Handicapped height Discharge Bathroom Accessibility: Yes How Accessible: Accessible via wheelchair, Accessible via walker Does the patient have any problems obtaining your medications?: No  Social/Family/Support  Systems Patient Roles: Spouse, Parent Contact Information: Spouse: Georgean Spainhower [phone] (cell) spouse disabled Anticipated Caregiver: Daughters can assist intermittently Anticipated Caregiver's Contact Information: Dtr: Verner Mould [phone] Ability/Limitations of Caregiver: Husband disabled and daughters intermittent  Caregiver Availability: Intermittent Discharge Plan Discussed with Primary Caregiver: Yes Is Caregiver In Agreement with Plan?: Yes Does Caregiver/Family have Issues with Lodging/Transportation while Pt is in Rehab?: No  Goals/Additional Needs Patient/Family Goal for Rehab: PT/OT Mod I  Expected length of stay: 17-20 days  Cultural Considerations: Christian  Dietary Needs: Regular textures and thin liquids  Equipment Needs: TBD Special Service Needs: None Additional Information: None Pt/Family Agrees to Admission and willing to participate: Yes Program Orientation Provided & Reviewed with Pt/Caregiver Including Roles  & Responsibilities: Yes Additional Information Needs: None Information Needs to be Provided By: N/A   Decrease burden of Care through IP rehab admission: No   Possible need for SNF placement upon discharge: No   Patient Condition: This patient's medical and functional status has changed since the consult dated: 06/16/16 in which the Rehabilitation Physician determined and documented that the patient's condition is appropriate for intensive rehabilitative care in an inpatient rehabilitation facility. See "History of Present Illness" (above) for medical update. Functional changes are: transfers with min guard assist; ambulates 100' min assist with RW ; sensory deficits persist; min guard for grooming and ADLs during functional mobility . Patient's medical and functional status update has been discussed with the Rehabilitation physician and patient remains appropriate for inpatient rehabilitation. Will admit to inpatient rehab  today.  Preadmission Screen Completed By:  Gunnar Fusi, with brief updates by Gerlean Ren  06/18/2016 1:14 PM ______________________________________________________________________   Discussed status with Dr. Posey Pronto on 06/19/16 at  1504  and received telephone approval for admission today.  Admission Coordinator:  Gunnar Fusi, time 4481 /Date 06/19/16       Cosigned by: Jamse Arn, MD at 06/19/2016 3:08 PM  Revision History

## 2016-06-21 ENCOUNTER — Inpatient Hospital Stay (HOSPITAL_COMMUNITY): Payer: BLUE CROSS/BLUE SHIELD | Admitting: Physical Therapy

## 2016-06-21 ENCOUNTER — Inpatient Hospital Stay (HOSPITAL_COMMUNITY): Payer: BLUE CROSS/BLUE SHIELD | Admitting: Occupational Therapy

## 2016-06-21 DIAGNOSIS — R208 Other disturbances of skin sensation: Secondary | ICD-10-CM

## 2016-06-21 LAB — ANGIOTENSIN CONVERTING ENZYME, CSF: Angio Convert Enzyme: 1 U/L (ref 0.0–2.5)

## 2016-06-21 MED ORDER — DULOXETINE HCL 20 MG PO CPEP
20.0000 mg | ORAL_CAPSULE | Freq: Every day | ORAL | Status: DC
  Administered 2016-06-21 – 2016-06-28 (×8): 20 mg via ORAL
  Filled 2016-06-21 (×9): qty 1

## 2016-06-21 NOTE — Progress Notes (Signed)
Physical Therapy Session Note  Patient Details  Name: Jess Sulak MRN: [id number] Date of Birth: Feb 21, 1960  Today's Date: 06/21/2016 PT Individual Time: 0845-1000 PT Individual Time Calculation (min): 75 min   Short Term Goals: Week 1:  PT Short Term Goal 1 (Week 1): Pt will be able to perform transfers with supervision PT Short Term Goal 2 (Week 1): Pt will be able to gait x 100' with supervision PT Short Term Goal 3 (Week 1): Pt will be able to perform stairs without rails with min assist for home entry  Skilled Therapeutic Interventions/Progress Updates: Pt presented in w/c agreeable to therapy. Transported to rehab gym for energy conservation. Performed stand pivot transfer w/c to mat x 2 with supervision.  Participated in standing balance activities including toe taps to target, alternating toe taps. Pt would occasionally have increase in tremors during activity lasting approx 15-20 sec. Performed static standing on airex while performing bug game connecting bugs for approx 5 min maintaining good balance. Pt with episode of tremor while stepping off Airex. Pt received extended therapeutic break due to MD assessment. Participated in gait no AD 23ft x 2 with modA, pt increased step length, decreased deliberate movements. Pt with x 3 episodes of stopping during gait due to tremors. Pt returned to w/c and returned to room remained in w/c with nsg present to administer meds.      Therapy Documentation Precautions:  Precautions Precautions: Fall Restrictions Weight Bearing Restrictions: No   See Function Navigator for Current Functional Status.   Therapy/Group: Individual Therapy  [place]  Brionne Mertz, PTA  06/21/2016, 12:02 PM

## 2016-06-21 NOTE — Patient Care Conference (Signed)
Inpatient RehabilitationTeam Conference and Plan of Care Update Date: 06/21/2016   Time: 10:50 am    Patient Name: Tamara [name]      Medical Record Number: [id number]  Date of Birth: 1960-06-06 Sex: Female         Room/Bed: 4W19C/4W19C-01 Payor Info: Payor: BLUE CROSS BLUE SHIELD / Plan: BCBS OTHER / Product Type: *No Product type* /    Admitting Diagnosis: tvm vs ms  Admit Date/Time:  06/19/2016  7:11 PM Admission Comments: No comment available   Primary Diagnosis:  Neurologic gait disorder Principal Problem: Neurologic gait disorder  Patient Active Problem List   Diagnosis Date Noted  . Multiple sclerosis (HCC) 06/19/2016  . Neuropathic pain   . Neurologic gait disorder   . Neurogenic bowel   . Neurogenic bladder   . Anemia of chronic disease   . Leukocytosis   . Steroid-induced hyperglycemia   . Acute transverse myelitis in demyelinating disease of central nervous system (HCC) 06/14/2016  . Allergic rhinitis 04/12/2016  . Medication overuse headache 03/17/2016  . Elevated blood pressure reading in office without diagnosis of hypertension 03/13/2016  . Otitis media 02/23/2014  . Sciatica associated with disorder of lumbosacral spine 01/15/2014  . Edema 01/15/2014  . Headache(784.0) 07/21/2013  . GERD (gastroesophageal reflux disease) 07/21/2013  . RBBB on EKG 08/01/2012  . IBS (irritable bowel syndrome) 06/19/2012  . Healthcare maintenance 06/19/2012    Expected Discharge Date: Expected Discharge Date: 06/28/16  Team Members Present: Physician leading conference: Dr. Claudette Laws Social Worker Present: Dossie Der, LCSW Nurse Present: Tennis Must, RN PT Present: Grier Rocher, PT;Rosita Dechalus, PTA OT Present: Johnsie Cancel, OT SLP Present: Fae Pippin, SLP PPS Coordinator present : Tora Duck, RN, CRRN     Current Status/Progress Goal Weekly Team Focus  Medical   mild cognitive deficits , no bowel or bladder issues  trial meds for dysesthesias  steroid taper  , med adjustment for pain   Bowel/Bladder   Continent to bowel and bladder with min. assist.LBM 5/22  To continue continent to B&B with min Assist.  Monitor bladder and bowel function Q shift.   Swallow/Nutrition/ Hydration             ADL's   Min A overall  Mod I overall, supervision IADLs and shower transfers  Neuro re-ed, standing balance and tolerance, ADL and IADL retraining, family ed, d/c planning   Mobility   min to mod assist  mod I to supervision  stair training, NMR, gait, balance, endurance   Communication             Safety/Cognition/ Behavioral Observations            Pain   Generalized pain.Tramadol 50 ml. Q 6 for headache.  To keep pain levels less than 3,on 1 to 10 scale.  To assess pain levels Q 2-3 hrs.   Skin   Dry and intact.  To keep skin free of pressure sores.  To monitor skin Q shift.      *See Care Plan and progress notes for long and short-term goals.  Barriers to Discharge: poor awareness , balance    Possible Resolutions to Barriers:  Cont rehab, may need neuropsych eval    Discharge Planning/Teaching Needs:  Home with husband who is able to provide supervision level due to own health issues. he was here yesterday in am to observe pt in therapies.      Team Discussion:  Goals mod/i level some supervision for tub transfers, car  transfers and steps. MD adjusting meds. Cognitive issues at baseline. Decreased endurance. UE more affected than LE. Decreased sensation in hands makes it hard for therapies. Benefit from neuro-psych seeing make referral.  Revisions to Treatment Plan:  DC 5/30   Continued Need for Acute Rehabilitation Level of Care: The patient requires daily medical management by a physician with specialized training in physical medicine and rehabilitation for the following conditions: Daily direction of a multidisciplinary physical rehabilitation program to ensure safe treatment while eliciting the highest outcome that is of practical  value to the patient.: Yes Daily medical management of patient stability for increased activity during participation in an intensive rehabilitation regime.: Yes Daily analysis of laboratory values and/or radiology reports with any subsequent need for medication adjustment of medical intervention for : Neurological problems  Adanya Sosinski, Lemar Livings 06/22/2016, 8:58 AM

## 2016-06-21 NOTE — Progress Notes (Signed)
Occupational Therapy Session Note  Patient Details  Name: Tamara [name] MRN: [id number] Date of Birth: 1960/11/15  Today's Date: 06/21/2016 OT Individual Time: 0071-2197 OT Individual Time Calculation (min): 60 min    Short Term Goals: Week 1:  OT Short Term Goal 1 (Week 1): STG=LTG due to LOS  Skilled Therapeutic Interventions/Progress Updates:    Pt seen for OT ADL bathing/dressing session. Pt sitting EOB upon arrival, voiced 10/10 nerve pain in hands and feet. She reported being pre-medicated prior to tx session. She ambulated throughout room using RW to gather clothing with guarding assist. She completed bathing/grooming tasks at sink, alternating sitting and standing. Pt demonstrates improved fine motor coordination and control this morning, however, cont to report numbness in B hands.  Pt left seated at end of session, all needs in reach.   Therapy Documentation Precautions:  Precautions Precautions: Fall Restrictions Weight Bearing Restrictions: No   See Function Navigator for Current Functional Status.   Therapy/Group: Individual Therapy  Lewis, Aryssa Rosamond C 06/21/2016, 7:07 AM

## 2016-06-21 NOTE — Progress Notes (Signed)
Subjective/Complaints: Pt without new issues numerous questions, states she has vit D deficiency hx and is asking about level Also concerned about elevated B12 but states she has been on supplements Also would like to try cymbalta before she leaves hospital ROS- no CP, SOB, N/V/D, , bladder ok Objective: Vital Signs: Blood pressure 110/63, pulse (!) 55, temperature 98.2 F (36.8 C), temperature source Oral, resp. rate 16, SpO2 99 %. No results found. Results for orders placed or performed during the hospital encounter of 06/19/16 (from the past 72 hour(s))  CBC     Status: Abnormal   Collection Time: 06/19/16  7:56 PM  Result Value Ref Range   WBC 12.6 (H) 4.0 - 10.5 K/uL   RBC 4.47 3.87 - 5.11 MIL/uL   Hemoglobin 12.5 12.0 - 15.0 g/dL   HCT 38.9 36.0 - 46.0 %   MCV 87.0 78.0 - 100.0 fL   MCH 28.0 26.0 - 34.0 pg   MCHC 32.1 30.0 - 36.0 g/dL   RDW 14.7 11.5 - 15.5 %   Platelets 199 150 - 400 K/uL  Creatinine, serum     Status: None   Collection Time: 06/19/16  7:56 PM  Result Value Ref Range   Creatinine, Ser 0.87 0.44 - 1.00 mg/dL   GFR calc non Af Amer >60 >60 mL/min   GFR calc Af Amer >60 >60 mL/min    Comment: (NOTE) The eGFR has been calculated using the CKD EPI equation. This calculation has not been validated in all clinical situations. eGFR's persistently <60 mL/min signify possible Chronic Kidney Disease.   CBC WITH DIFFERENTIAL     Status: Abnormal   Collection Time: 06/20/16  5:45 AM  Result Value Ref Range   WBC 9.8 4.0 - 10.5 K/uL   RBC 4.09 3.87 - 5.11 MIL/uL   Hemoglobin 11.3 (L) 12.0 - 15.0 g/dL   HCT 35.8 (L) 36.0 - 46.0 %   MCV 87.5 78.0 - 100.0 fL   MCH 27.6 26.0 - 34.0 pg   MCHC 31.6 30.0 - 36.0 g/dL   RDW 14.7 11.5 - 15.5 %   Platelets 161 150 - 400 K/uL   Neutrophils Relative % 66 %   Neutro Abs 6.5 1.7 - 7.7 K/uL   Lymphocytes Relative 24 %   Lymphs Abs 2.3 0.7 - 4.0 K/uL   Monocytes Relative 10 %   Monocytes Absolute 0.9 0.1 - 1.0 K/uL   Eosinophils Relative 0 %   Eosinophils Absolute 0.0 0.0 - 0.7 K/uL   Basophils Relative 0 %   Basophils Absolute 0.0 0.0 - 0.1 K/uL  Comprehensive metabolic panel     Status: Abnormal   Collection Time: 06/20/16  5:45 AM  Result Value Ref Range   Sodium 140 135 - 145 mmol/L   Potassium 3.7 3.5 - 5.1 mmol/L   Chloride 104 101 - 111 mmol/L   CO2 30 22 - 32 mmol/L   Glucose, Bld 85 65 - 99 mg/dL   BUN 15 6 - 20 mg/dL   Creatinine, Ser 0.87 0.44 - 1.00 mg/dL   Calcium 8.1 (L) 8.9 - 10.3 mg/dL   Total Protein 5.1 (L) 6.5 - 8.1 g/dL   Albumin 2.7 (L) 3.5 - 5.0 g/dL   AST 14 (L) 15 - 41 U/L   ALT 20 14 - 54 U/L   Alkaline Phosphatase 47 38 - 126 U/L   Total Bilirubin 0.4 0.3 - 1.2 mg/dL   GFR calc non Af Amer >60 >60 mL/min  GFR calc Af Amer >60 >60 mL/min    Comment: (NOTE) The eGFR has been calculated using the CKD EPI equation. This calculation has not been validated in all clinical situations. eGFR's persistently <60 mL/min signify possible Chronic Kidney Disease.    Anion gap 6 5 - 15     HEENT: normal Cardio: RRR and no murmur Resp: RRR and no murmur GI: BS positive and NT, ND Extremity:  Pulses positive and No Edema Skin:   Intact Neuro: Alert/Oriented, Cranial Nerve II-XII normal, Abnormal Sensory reduced in BLE and B hands to LT, Abnormal Motor 4/5 in BUE, 3/5 in bilateral knee ext, 4/5 in ankle DF, Abnormal FMC Ataxic/ dec FMC and Reflexes: 3+ Musc/Skel:  Other no pain with UE or LE ROM Gen NAD   Assessment/Plan: 1. Functional deficits secondary to CNS inflammatory demyelinating disease which require 3+ hours per day of interdisciplinary therapy in a comprehensive inpatient rehab setting. Physiatrist is providing close team supervision and 24 hour management of active medical problems listed below. Physiatrist and rehab team continue to assess barriers to discharge/monitor patient progress toward functional and medical goals. FIM: Function - Bathing Position:  Shower Body parts bathed by patient: Right arm, Left lower leg, Left arm, Chest, Abdomen, Front perineal area, Buttocks, Right upper leg, Left upper leg, Right lower leg Body parts bathed by helper: Back Assist Level: Touching or steadying assistance(Pt > 75%)  Function- Upper Body Dressing/Undressing What is the patient wearing?: Bra, Pull over shirt/dress Bra - Perfomed by patient: Thread/unthread right bra strap, Thread/unthread left bra strap, Hook/unhook bra (pull down sports bra) Pull over shirt/dress - Perfomed by patient: Thread/unthread right sleeve, Thread/unthread left sleeve, Put head through opening, Pull shirt over trunk Assist Level: Set up Set up : To obtain clothing/put away Function - Lower Body Dressing/Undressing What is the patient wearing?: Underwear, Pants, Non-skid slipper socks Position: Other (comment) (sitting on toilet) Underwear - Performed by patient: Thread/unthread left underwear leg, Thread/unthread right underwear leg, Pull underwear up/down Pants- Performed by patient: Thread/unthread left pants leg, Pull pants up/down Pants- Performed by helper: Thread/unthread right pants leg Non-skid slipper socks- Performed by patient: Don/doff right sock, Don/doff left sock Assist for footwear: Supervision/touching assist Assist for lower body dressing: Touching or steadying assistance (Pt > 75%)  Function - Toileting Toileting steps completed by patient: Adjust clothing prior to toileting, Performs perineal hygiene, Adjust clothing after toileting Toileting Assistive Devices: Grab bar or rail Assist level: Supervision or verbal cues  Function - Air cabin crew transfer assistive device: Grab bar, Walker Assist level to toilet: Touching or steadying assistance (Pt > 75%) Assist level from toilet: Touching or steadying assistance (Pt > 75%)  Function - Chair/bed transfer Chair/bed transfer method: Squat pivot Chair/bed transfer assist level: Touching or  steadying assistance (Pt > 75%) Chair/bed transfer assistive device: Armrests  Function - Locomotion: Wheelchair Type: Manual Max wheelchair distance: 150' Assist Level: Supervision or verbal cues Assist Level: Supervision or verbal cues Assist Level: Supervision or verbal cues Turns around,maneuvers to table,bed, and toilet,negotiates 3% grade,maneuvers on rugs and over doorsills: No Function - Locomotion: Ambulation Assistive device: No device Max distance: 10' Assist level: Moderate assist (Pt 50 - 74%) Assist level: Moderate assist (Pt 50 - 74%) Assist level: Moderate assist (Pt 50 - 74%) Walk 150 feet activity did not occur: Safety/medical concerns Walk 10 feet on uneven surfaces activity did not occur: Safety/medical concerns  Function - Comprehension Comprehension: Auditory Comprehension assist level: Follows complex conversation/direction with extra time/assistive  device  Function - Expression Expression: Verbal Expression assist level: Expresses complex ideas: With extra time/assistive device  Function - Social Interaction Social Interaction assist level: Interacts appropriately with others - No medications needed.  Function - Problem Solving Problem solving assist level: Solves complex problems: With extra time  Function - Memory Memory assist level: Recognizes or recalls 90% of the time/requires cueing < 10% of the time Patient normally able to recall (first 3 days only): Current season, Location of own room, Staff names and faces, That he or she is in a hospital  Medical Problem List and Plan: 1.  Gait deficits, sensory loss related to demyelinating disease secondary to multiple sclerosis versus cervical myelitis. IV Solu-Medrol completed prednisone taper As directed with 50 mg daily 1 day then decrease by 5 mg each day until completed Team conference today please see physician documentation under team conference tab, met with team face-to-face to discuss  problems,progress, and goals. Formulized individual treatment plan based on medical history, underlying problem and comorbidities. 2.  DVT Prophylaxis/Anticoagulation: Subcutaneous Lovenox. Check vascular study 3. Pain Management including neuropathic pain: Ultram as needed 4. Mood: Provide emotional support 5. Neuropsych: This patient is capable of making decisions on her own behalf. 6. Skin/Wound Care: Routine skin checks 7. Fluids/Electrolytes/Nutrition: Routine I&O with follow-up chemistries 8. Neurogenic bowel and bladder. Establish bowel program. Check PVR 3 9. Chronic anemia. Follow-up CBC,Hgb 11.3 10. Leukocytosis: Resolved likely steroid, afebrile 11. Steroid induced hyperglycemia:improved on taper CBG (last 3)   Recent Labs  06/19/16 0606 06/19/16 1132 06/19/16 1642  GLUCAP 80 99 108*    12.  Hypoalbuminemia- prostat LOS (Days) 2 A FACE TO FACE EVALUATION WAS PERFORMED  KIRSTEINS,ANDREW E 06/21/2016, 9:02 AM

## 2016-06-21 NOTE — Progress Notes (Signed)
Physical Therapy Session Note  Patient Details  Name: Tamara [name] MRN: [id number] Date of Birth: Nov 08, 1960  Today's Date: 06/21/2016 PT Individual Time: 0630-1601 PT Individual Time Calculation (min): 70 min   Short Term Goals: Week 1:  PT Short Term Goal 1 (Week 1): Pt will be able to perform transfers with supervision PT Short Term Goal 2 (Week 1): Pt will be able to gait x 100' with supervision PT Short Term Goal 3 (Week 1): Pt will be able to perform stairs without rails with min assist for home entry  Skilled Therapeutic Interventions/Progress Updates:   Pt received sitting EOB and agreeable to PT. Stand pivot transfer to [facility] with min assist from PT. Pt transported to rehab gym in [facility]. Gait training with RW x 149f with min assist from PT. Multiple standing  rest breaks due to "increased numb feeling". Dynamic gait training to weave through 6 cones x 2 with RW and intermittent supervision-min assist. One standing rest break to allow BUE and BLE to rest following increased tremors.   Stand pivot to mat table as well as sit>supine completed with supervision assist from PT and min cues for technique. Pt returned to sitting with supervision assist. Education for sitting technique in modified figure four to improve hip ROM and reduce pain in BLE.   Supine therex; all completed 2x 12 with 2 second hold. Min-mod cues from PT for proper speed of movement, ROM, and improve eccentric control to improve strengthening aspect of movement. Prolonged rest break between each set of movements.  SLR,  SAQ with level 2 tband,  Hip abduction.  sidelying clamshells.   [facility]iner with RW and supervision assist from PT. Min cues for AD management in turn to sit. Pt left sitting in recliner with call bell in reach and all needs met.            Therapy Documentation Precautions:  Precautions Precautions: Fall Restrictions Weight Bearing  Restrictions: No Pain: Pain Assessment Pain Score: 3    See Function Navigator for Current Functional Status.   Therapy/Group: Individual Therapy  ALorie Phenix5/23/2018, 3:00 PM

## 2016-06-21 NOTE — Progress Notes (Signed)
In patients preadmission assessment dated 06/18/16 , the HPI failed to populate.  Below is the current HPI:    Tamara [name] a 56 y.o.right handed femalewith history of sciatica, irritable bowel syndrome, chronic anemia. Per chart review patient lives with spouse. Independent prior to admission. 2 level home with bedroom on first floor and 4 steps to entry. Husband is disabled. She presented 06/14/2016 with progressive numbness involving her trunk from the chest including lower extremities as well as numbness involving both hands. MRI of the brain reviewed, showing demyelination.  Per report, 5 cm white matter lesion concerning for chronic demyelination, dominant 9 mm right parietal lesion. MRI cervical thoracic and lumbar spine shows acute 11 mm C3 demyelinating lesion. Chronic 5 mm C5 demyelinating lesion. No MR findings of demyelination and thoracic spinal cord or lumbar spine. CSF,VZV titers negative. Placed on IV Solu-Medrol per neurology services suspect cervical myelitis/multiple sclerosis and plan follow-up outpatient with Dr. Serita Grit of neurology services. Subcutaneous Lovenox for DVT prophylaxis. Physical and occupational therapy evaluation completed with recommendations of physical medicine rehabilitation consult.Patient was admitted for a comprehensive rehabilitation program.  Gerlean Ren PT Inpatient Rehab Admissions Coordinator Cell [phone] Office [phone]

## 2016-06-21 NOTE — Progress Notes (Signed)
Social Work Patient ID: Tamara [name], female   DOB: 05-06-1960, 56 y.o.   MRN: [id number]  Met with pt to discuss team conference goals mod/i-supervision level and target discharge 5/30. She is feeling better and is trying to continue to push herself forward in her therapies. She wants to go to OP once leaves here and doesn't want lapse in therapies. Will call today and get appointments for her once discharged. Will work on discharge Needs and have neuro-psych see for coping.

## 2016-06-22 ENCOUNTER — Inpatient Hospital Stay (HOSPITAL_COMMUNITY): Payer: BLUE CROSS/BLUE SHIELD | Admitting: Occupational Therapy

## 2016-06-22 ENCOUNTER — Inpatient Hospital Stay (HOSPITAL_COMMUNITY): Payer: BLUE CROSS/BLUE SHIELD | Admitting: Physical Therapy

## 2016-06-22 LAB — VITAMIN D 25 HYDROXY (VIT D DEFICIENCY, FRACTURES): Vit D, 25-Hydroxy: 25.6 ng/mL — ABNORMAL LOW (ref 30.0–100.0)

## 2016-06-22 MED ORDER — CHOLECALCIFEROL 10 MCG (400 UNIT) PO TABS
400.0000 [IU] | ORAL_TABLET | Freq: Two times a day (BID) | ORAL | Status: DC
  Administered 2016-06-22 – 2016-06-28 (×13): 400 [IU] via ORAL
  Filled 2016-06-22 (×13): qty 1

## 2016-06-22 NOTE — Progress Notes (Signed)
Subjective/Complaints: Discussed Vit D and B12 levels , discussed cymbalta (no SE thus far) ROS- no CP, SOB, N/V/D, , bladder ok Objective: Vital Signs: Blood pressure (!) 146/81, pulse 68, temperature 98.4 F (36.9 C), temperature source Oral, resp. rate 18, weight 103.4 kg (228 lb 1.1 oz), SpO2 100 %. No results found. Results for orders placed or performed during the hospital encounter of 06/19/16 (from the past 72 hour(s))  CBC     Status: Abnormal   Collection Time: 06/19/16  7:56 PM  Result Value Ref Range   WBC 12.6 (H) 4.0 - 10.5 K/uL   RBC 4.47 3.87 - 5.11 MIL/uL   Hemoglobin 12.5 12.0 - 15.0 g/dL   HCT 38.9 36.0 - 46.0 %   MCV 87.0 78.0 - 100.0 fL   MCH 28.0 26.0 - 34.0 pg   MCHC 32.1 30.0 - 36.0 g/dL   RDW 14.7 11.5 - 15.5 %   Platelets 199 150 - 400 K/uL  Creatinine, serum     Status: None   Collection Time: 06/19/16  7:56 PM  Result Value Ref Range   Creatinine, Ser 0.87 0.44 - 1.00 mg/dL   GFR calc non Af Amer >60 >60 mL/min   GFR calc Af Amer >60 >60 mL/min    Comment: (NOTE) The eGFR has been calculated using the CKD EPI equation. This calculation has not been validated in all clinical situations. eGFR's persistently <60 mL/min signify possible Chronic Kidney Disease.   CBC WITH DIFFERENTIAL     Status: Abnormal   Collection Time: 06/20/16  5:45 AM  Result Value Ref Range   WBC 9.8 4.0 - 10.5 K/uL   RBC 4.09 3.87 - 5.11 MIL/uL   Hemoglobin 11.3 (L) 12.0 - 15.0 g/dL   HCT 35.8 (L) 36.0 - 46.0 %   MCV 87.5 78.0 - 100.0 fL   MCH 27.6 26.0 - 34.0 pg   MCHC 31.6 30.0 - 36.0 g/dL   RDW 14.7 11.5 - 15.5 %   Platelets 161 150 - 400 K/uL   Neutrophils Relative % 66 %   Neutro Abs 6.5 1.7 - 7.7 K/uL   Lymphocytes Relative 24 %   Lymphs Abs 2.3 0.7 - 4.0 K/uL   Monocytes Relative 10 %   Monocytes Absolute 0.9 0.1 - 1.0 K/uL   Eosinophils Relative 0 %   Eosinophils Absolute 0.0 0.0 - 0.7 K/uL   Basophils Relative 0 %   Basophils Absolute 0.0 0.0 - 0.1  K/uL  Comprehensive metabolic panel     Status: Abnormal   Collection Time: 06/20/16  5:45 AM  Result Value Ref Range   Sodium 140 135 - 145 mmol/L   Potassium 3.7 3.5 - 5.1 mmol/L   Chloride 104 101 - 111 mmol/L   CO2 30 22 - 32 mmol/L   Glucose, Bld 85 65 - 99 mg/dL   BUN 15 6 - 20 mg/dL   Creatinine, Ser 0.87 0.44 - 1.00 mg/dL   Calcium 8.1 (L) 8.9 - 10.3 mg/dL   Total Protein 5.1 (L) 6.5 - 8.1 g/dL   Albumin 2.7 (L) 3.5 - 5.0 g/dL   AST 14 (L) 15 - 41 U/L   ALT 20 14 - 54 U/L   Alkaline Phosphatase 47 38 - 126 U/L   Total Bilirubin 0.4 0.3 - 1.2 mg/dL   GFR calc non Af Amer >60 >60 mL/min   GFR calc Af Amer >60 >60 mL/min    Comment: (NOTE) The eGFR has been calculated using  the CKD EPI equation. This calculation has not been validated in all clinical situations. eGFR's persistently <60 mL/min signify possible Chronic Kidney Disease.    Anion gap 6 5 - 15  VITAMIN D 25 Hydroxy (Vit-D Deficiency, Fractures)     Status: Abnormal   Collection Time: 06/21/16 10:39 AM  Result Value Ref Range   Vit D, 25-Hydroxy 25.6 (L) 30.0 - 100.0 ng/mL    Comment: (NOTE) Vitamin D deficiency has been defined by the Institute of Medicine and an Endocrine Society practice guideline as a level of serum 25-OH vitamin D less than 20 ng/mL (1,2). The Endocrine Society went on to further define vitamin D insufficiency as a level between 21 and 29 ng/mL (2). 1. IOM (Institute of Medicine). 2010. Dietary reference   intakes for calcium and D. [place]: The   [employer]. 2. Holick MF, [place], Bischoff-Ferrari HA, et al.   Evaluation, treatment, and prevention of vitamin D   deficiency: an Endocrine Society clinical practice   guideline. JCEM. 2011 Jul; 96(7):1911-30. Performed At: [facility] [place], [place] [id number] Lindon Romp MD TD:[id number]      HEENT: normal Cardio: RRR and no murmur Resp: RRR and no murmur GI: BS positive and  NT, ND Extremity:  Pulses positive and No Edema Skin:   Intact Neuro: Alert/Oriented, Cranial Nerve II-XII normal, Abnormal Sensory reduced in BLE and B hands to LT, Abnormal Motor 4/5 in BUE, 3/5 in bilateral knee ext, 4/5 in ankle DF, Abnormal FMC Ataxic/ dec FMC and Reflexes: 3+ Musc/Skel:  Other no pain with UE or LE ROM Gen NAD   Assessment/Plan: 1. Functional deficits secondary to CNS inflammatory demyelinating disease which require 3+ hours per day of interdisciplinary therapy in a comprehensive inpatient rehab setting. Physiatrist is providing close team supervision and 24 hour management of active medical problems listed below. Physiatrist and rehab team continue to assess barriers to discharge/monitor patient progress toward functional and medical goals. FIM: Function - Bathing Position: Shower Body parts bathed by patient: Right arm, Left lower leg, Left arm, Chest, Abdomen, Front perineal area, Buttocks, Right upper leg, Left upper leg, Right lower leg, Back Body parts bathed by helper: Back Assist Level: Supervision or verbal cues  Function- Upper Body Dressing/Undressing What is the patient wearing?: Bra, Pull over shirt/dress Bra - Perfomed by patient: Thread/unthread right bra strap, Thread/unthread left bra strap, Hook/unhook bra (pull down sports bra) Pull over shirt/dress - Perfomed by patient: Thread/unthread right sleeve, Thread/unthread left sleeve, Put head through opening, Pull shirt over trunk Assist Level: More than reasonable time Set up : To obtain clothing/put away Function - Lower Body Dressing/Undressing What is the patient wearing?: Underwear, Pants, Non-skid slipper socks Position: Other (comment) (sitting on toilet) Underwear - Performed by patient: Thread/unthread left underwear leg, Thread/unthread right underwear leg, Pull underwear up/down Pants- Performed by patient: Thread/unthread left pants leg, Pull pants up/down Pants- Performed by helper:  Thread/unthread right pants leg Non-skid slipper socks- Performed by patient: Don/doff right sock, Don/doff left sock Assist for footwear: Supervision/touching assist Assist for lower body dressing: Touching or steadying assistance (Pt > 75%)  Function - Toileting Toileting steps completed by patient: Adjust clothing prior to toileting, Performs perineal hygiene, Adjust clothing after toileting Toileting Assistive Devices: Grab bar or rail Assist level: Supervision or verbal cues  Function - Air cabin crew transfer assistive device: Grab bar, Walker Assist level to toilet: Touching or steadying assistance (Pt > 75%) Assist level from  toilet: Touching or steadying assistance (Pt > 75%)  Function - Chair/bed transfer Chair/bed transfer method: Stand pivot Chair/bed transfer assist level: Supervision or verbal cues Chair/bed transfer assistive device: Walker  Function - Locomotion: Wheelchair Type: Manual Max wheelchair distance: 150' Assist Level: Supervision or verbal cues Assist Level: Supervision or verbal cues Assist Level: Supervision or verbal cues Turns around,maneuvers to table,bed, and toilet,negotiates 3% grade,maneuvers on rugs and over doorsills: No Function - Locomotion: Ambulation Assistive device: Walker-rolling Max distance: 140f  Assist level: Touching or steadying assistance (Pt > 75%) Assist level: Touching or steadying assistance (Pt > 75%) Assist level: Touching or steadying assistance (Pt > 75%) Walk 150 feet activity did not occur: Safety/medical concerns Assist level: Touching or steadying assistance (Pt > 75%) Walk 10 feet on uneven surfaces activity did not occur: Safety/medical concerns  Function - Comprehension Comprehension: Auditory Comprehension assist level: Follows complex conversation/direction with extra time/assistive device  Function - Expression Expression: Verbal Expression assist level: Expresses complex ideas: With extra  time/assistive device  Function - Social Interaction Social Interaction assist level: Interacts appropriately with others with medication or extra time (anti-anxiety, antidepressant).  Function - Problem Solving Problem solving assist level: Solves complex 90% of the time/cues < 10% of the time  Function - Memory Memory assist level: Recognizes or recalls 90% of the time/requires cueing < 10% of the time Patient normally able to recall (first 3 days only): Current season, Location of own room, Staff names and faces, That he or she is in a hospital  Medical Problem List and Plan: 1.  Gait deficits, sensory loss related to demyelinating disease secondary to multiple sclerosis versus cervical myelitis.Cont CIR PT, OT, discussed steroid taper causing insomnia 2.  DVT Prophylaxis/Anticoagulation: Subcutaneous Lovenox. Check vascular study 3. Pain Management including neuropathic pain: Ultram as needed 4. Mood: Provide emotional support 5. Neuropsych: This patient is capable of making decisions on her own behalf. 6. Skin/Wound Care: Routine skin checks 7. Fluids/Electrolytes/Nutrition: Routine I&O with follow-up chemistries 8. Neurogenic bowel and bladder. Establish bowel program. Check PVR 3 9. Chronic anemia. Follow-up CBC,Hgb 11.3 10. Leukocytosis: Resolved likely steroid, afebrile 11. Steroid induced hyperglycemia:improved on taper CBG (last 3)   Recent Labs  06/19/16 1132 06/19/16 1642  GLUCAP 99 108*    12.  Hypoalbuminemia- prostat !3.  Mild vit D deficiency supplement D3 600-800 IU per day LOS (Days) 3 A FACE TO FACE EVALUATION WAS PERFORMED  Tamara [name] E 06/22/2016, 8:31 AM

## 2016-06-22 NOTE — Progress Notes (Addendum)
Occupational Therapy Session Note  Patient Details  Name: Kierstin January MRN: [id number] Date of Birth: [birth year]  Today's Date: 06/22/2016 OT Individual Time: 6333-5456 and 1300-1400 OT Individual Time Calculation (min): 60 min and 60 min   Short Term Goals: Week 1:  OT Short Term Goal 1 (Week 1): STG=LTG due to LOS  Skilled Therapeutic Interventions/Progress Updates:    Session One: Pt seen for OT ADL bathing/dressing session. Pt sitting EOB upon arrival, having already gathered clothing items in prep for shower and agreeable to tx session. She ambulated throughout session with RW and supervision, VCs for use of RW in functional context. She bathed seated on shower chair, supervision for standing positions with reliance on grab bars for balance. She dressed and completed grooming tasks from w/c level, slightly increased time to manage containers and standing with supervision to pull pants up.  Cont with complaints of numbness in B hands, however, is demonstrating ability to functionally manage it in order to complete ADL tasks. Educated throughout session regarding decreased sensation in feet and hands, discussed functional implications and safety awareness.  Rest breaks required throughout session due to decreased functional activity tolerance, able to recover with short break.  Pt left seated in w/c at end of session, all needs in reach.   Session Two: Pt seen for OT session focusing on IADL re-training and fine motor coordination. Pt sitting EOB upon arrival, ready to go for therapy. She ambulated throughout session using RW with supervision assist.  In ADL apartment, practiced removing and replacing items from overhead cabinet using B UEs. Pt required increased time to complete task with only minimal UE discordination noted.  In therapy gym, completed fine motor threading beads onto string, pt able to complete with slightly increased time though voiced "excruciating nerve pain" in B hands  with any light touch sensation. Completed 9 hole peg test, see results below. She then completed buttoning task, buttoning and unbuttoning small buttons on dress shirt, completed with increased time. She ambulated to pt laundry facility, completed simulated laundry task with supervision and VCs for RW management. Pt stood to fold towels, able to use B UEs simultaneously and maintain standing balance. She returned to room at end of session, left seated EOB set-up with meal tray and all needs in reach.   9 hole peg test:  R: 43.32 seconds, 27.33 seconds, and 24.4 seconds L: 1 min 57 seconds, 1 min 19 seconds, and 56. 52 seconds  Therapy Documentation Precautions:  Precautions Precautions: Fall Restrictions Weight Bearing Restrictions: No Pain: Pain Assessment Pain Assessment: 0-10 Pain Score: 10-Worst pain ever Pain Type: Acute pain Pain Location: Hand Pain Orientation: Right;Left Pain Radiating Towards: feets, breast plate with radisting semsation to entire BLE's Pain Descriptors / Indicators: Burning;Constant;Numbness;Tingling (" Like an electrical shock that is comstant'  ) Pain Frequency: Constant Pain Onset: On-going Pain Intervention(s): Repositioned, shower Multiple Pain Sites: Yes  See Function Navigator for Current Functional Status.   Therapy/Group: Individual Therapy  Lewis, Mavis Fichera C 06/22/2016, 6:42 AM

## 2016-06-22 NOTE — Progress Notes (Signed)
Physical Therapy Session Note  Patient Details  Name: Tamara [name] MRN: [id number] Date of Birth: February 04, 1960  Today's Date: 06/22/2016 PT Individual Time: 8466-5993 PT Individual Time Calculation (min): 90 min   Short Term Goals: Week 1:  PT Short Term Goal 1 (Week 1): Pt will be able to perform transfers with supervision PT Short Term Goal 2 (Week 1): Pt will be able to gait x 100' with supervision PT Short Term Goal 3 (Week 1): Pt will be able to perform stairs without rails with min assist for home entry  Skilled Therapeutic Interventions/Progress Updates:   Pt received sitting in WC and agreeable to PT  PT instructed pt in [facility] mobility through hall hall of hospital 2x 168f with supervision assist and only occassional cues for doorway management.   Stair negotiation training instructed by PT with Supervision assist and BUE support x 12. Min cues for proper use of BLE and improved awareness of BLE and safety with descent.   PT instructed pt in gait training with RW x 152fand supervision assist min cues for safety in turns with RW. Gait training also instruct by PT with SBCQ and SPC 2 x 7021fith each AD min assist. Moderate cues for proper reciprocal gait pattern and proper positioning of AD. Gait training also completed without AD 2x 150f45fth supervision assist from PT. Pt noted to have improved step length, cadence, and trunk movement without AD.   PT instructed pt in Nustep reciprocal movement training x 5+2 min level 4. Pt reports increased numbness with increased time in BUE. Pt reports sensation improves with rest in BLE.   Dynamic Standing balance training on airex pad  x10 minutes while playing connect four. Supervision assist overall with one instance of posterior LOB. Pt demonstrated use of ankle strategy to prevent LOB throughout time on airex.  Dynamic balance performed with variable gait training forward/backward, and side stepping with min assist from PT and min cues for  decreased step length to improve normalized gait pattern. Stepping over 2 1 inch obstacles x 8 with supervision assist from PT. Stepping over 6 inch bolster with min-mod assist x 6. Pt noted to have decreased coordination and stability with stepping over tall obstacle.   Patient returned too room and [facility] reach and all needs met.           Therapy Documentation Precautions:  Precautions Precautions: Fall Restrictions Weight Bearing Restrictions: No Pain:   0/10. But reports numbness in hands and feet  See Function Navigator for Current Functional Status.   Therapy/Group: Individual Therapy  AustLorie Phenix4/2018, 10:16 AM

## 2016-06-22 NOTE — IPOC Note (Signed)
Overall Plan of Care [facility]) Patient Details Name: Tamara [name] MRN: [id number] DOB: [birth year]  Admitting Diagnosis: tvm vs ms  Hospital Problems: Principal Problem:   Neurologic gait disorder Active Problems:   Multiple sclerosis ([place])     Functional Problem List: Nursing    PT Balance, Endurance, Motor, Pain, Sensory, Skin Integrity  OT Balance, Edema, Endurance, Safety, Perception, Pain, Motor, Sensory  SLP    TR         Basic ADL's: OT Eating, Grooming, Bathing, Dressing, Toileting     Advanced  ADL's: OT Simple Meal Preparation, Laundry, Light Housekeeping     Transfers: PT Bed Mobility, Bed to Chair, Car, [employer], Floor  OT Toilet, Metallurgist: PT Ambulation, Stairs     Additional Impairments: OT Fuctional Use of Upper Extremity  SLP        TR      Anticipated Outcomes Item Anticipated Outcome  Self Feeding Mod I  Swallowing      Basic self-care  Mod I  Toileting  Mod I   Bathroom Transfers Supervision- mod I  Bowel/Bladder     Transfers  mod I transfers; supervision car  Locomotion  mod I household gait distances; supervision stairs  Communication     Cognition     Pain     Safety/Judgment      Therapy Plan: PT Intensity: Minimum of 1-2 x/day ,45 to 90 minutes PT Frequency: 5 out of 7 days PT Duration Estimated Length of Stay: 8-10 days OT Intensity: Minimum of 1-2 x/day, 45 to 90 minutes OT Frequency: 5 out of 7 days OT Duration/Estimated Length of Stay: 7-10 days         Team Interventions: Nursing Interventions    PT interventions Ambulation/gait training, Training and development officer, Cognitive remediation/compensation, Discharge planning, Community reintegration, Disease management/prevention, DME/adaptive equipment instruction, Functional mobility training, Neuromuscular re-education, Pain management, Patient/family education, Psychosocial support, Skin care/wound management, Stair training, Splinting/orthotics,  Therapeutic Activities, Therapeutic Exercise, UE/LE Strength taining/ROM, UE/LE Coordination activities, Wheelchair propulsion/positioning  OT Interventions Training and development officer, Discharge planning, Community reintegration, Engineer, drilling, Functional mobility training, Neuromuscular re-education, Pain management, Psychosocial support, Patient/family education, Self Care/advanced ADL retraining, Therapeutic Activities, Therapeutic Exercise, UE/LE Strength taining/ROM, UE/LE Coordination activities  SLP Interventions    TR Interventions    SW/CM Interventions Discharge Planning, Psychosocial Support, Patient/Family Education    Team Discharge Planning: Destination: PT-Home ,OT- Home , SLP-  Projected Follow-up: PT-Outpatient PT, OT-  Outpatient OT, SLP-  Projected Equipment Needs: PT-Rolling walker with 5" wheels, OT- Tub/shower seat, To be determined, SLP-  Equipment Details: PT-RW TBD, OT-  Patient/family involved in discharge planning: PT- Patient,  OT-Patient, SLP-   MD ELOS: 8-12d Medical Rehab Prognosis:  Excellent Assessment:  56 y.o.right handed femalewith history of sciatica, irritable bowel syndrome, chronic anemia. Per chart review patient lives with spouse. Independent prior to admission. 2 level home with bedroom on first floor and 4 steps to entry. Husband is disabled. She presented 06/14/2016 with progressive numbness involving her trunk from the chest including lower extremities as well as numbness involving both hands. MRI of the brain reviewed, showing demyelination.  Per report, 5 cm white matter lesion concerning for chronic demyelination, dominant 9 mm right parietal lesion. MRI cervical thoracic and lumbar spine shows acute 11 mm C3 demyelinating lesion. Chronic 5 mm C5 demyelinating lesion. No MR findings of demyelination and thoracic spinal cord or lumbar spine. CSF,VZV titers negative. Placed on IV Solu-Medrol per neurology services suspect cervical  myelitis/multiple sclerosis and plan follow-up outpatient with Dr. Serita Grit of neurology services.    Now requiring 24/7 Rehab RN,MD, as well as CIR level PT, OT and SLP.  Treatment team will focus on ADLs and mobility with goals set at Mod I See Team Conference Notes for weekly updates to the plan of care

## 2016-06-23 ENCOUNTER — Inpatient Hospital Stay (HOSPITAL_COMMUNITY): Payer: BLUE CROSS/BLUE SHIELD | Admitting: Occupational Therapy

## 2016-06-23 ENCOUNTER — Inpatient Hospital Stay (HOSPITAL_COMMUNITY): Payer: BLUE CROSS/BLUE SHIELD | Admitting: Physical Therapy

## 2016-06-23 DIAGNOSIS — M792 Neuralgia and neuritis, unspecified: Secondary | ICD-10-CM

## 2016-06-23 MED ORDER — CAPSAICIN 0.025 % EX CREA
TOPICAL_CREAM | Freq: Two times a day (BID) | CUTANEOUS | Status: DC
  Administered 2016-06-23: 20:00:00 via TOPICAL
  Filled 2016-06-23: qty 60

## 2016-06-23 NOTE — Progress Notes (Signed)
Physical Therapy Session Note  Patient Details  Name: Tamara [name] MRN: [id number] Date of Birth: Jan 04, 1961  Today's Date: 06/23/2016 PT Individual Time: 0905-1000 1430-1545 PT Individual Time Calculation (min): 55 min AND 23mn   Short Term Goals: Week 1:  PT Short Term Goal 1 (Week 1): Pt will be able to perform transfers with supervision PT Short Term Goal 2 (Week 1): Pt will be able to gait x 100' with supervision PT Short Term Goal 3 (Week 1): Pt will be able to perform stairs without rails with min assist for home entry  Skilled Therapeutic Interventions/Progress Updates:   Pt received sitting in recliner and agreeable to PT. [facility]. PT educated pt in need for staff whenever standing for safety due to coordination deficits and weakness.   Gait training through hall x2085fwith supervision assist and RW. Additional gait training without AD x 15019fnd supervision assist from PT, pt noted to have increased Supination with BLE at ankle. Pt reports in increased sensation in lateral edge of Bilateral feet, causing her to land in supinated position.   Reciprocal toe taps on 6 inch step x 12 with min assist from PT. Lateral foot taps on 6 inch step x 10 BLE. Pt noted to have improved pelvic control and postural stability on this day.   Bed mobility completed for rolling R and L, and sit<>supine without cues or assist.  Qped shoulder flexion x 10 BLE and hip/knee extension x 10. PT provided moderate verbal cues for set up and proper speed of movement and ROM to improved Nueromotor control.  Supine : bridges with 3 second hols x 10. SLR with bridge x 5 BLE. Dynamic reversals for lumbar rotation.   Standing balance on red progressing to Blue wedge while completing Peg board puzzles. Mod assist progressing to supervision assist.    Pt returned to room and performed stand pivot transfer to bed with supervision assist.  Sit>supine completed without assist and left supine in bed with call bell in reach and all needs met.   Session 2.   Pt received supine in bed and agreeable to PT. Supine>sit transfer without assist or cues  PT instructed pt in gait with RW on level and unlevel surface including cement sidewalk and crowded hospital entrance x [id number] 200ft.89falso instructed pt in use of Rollator in hall of rehab unit 200ft, 16ft, 163f and24ft. PT 37fided moderate multimodal cues for safety with transfers using Rollator and safety in turns to improve stability through UE support. Nustep reciprocal movement training x4min +4min105mth r61m break in between due to increased paresthesia and numbness in BUE. Patient returned too room and left sitting EOB with call bell in reach and all needs met.           Therapy Documentation Precautions:  Precautions Precautions: Fall Restrictions Weight Bearing Restrictions: No Pain: Pain Assessment Pain Assessment: 0-10 Pain Score: 3  Pain Type: Acute pain Pain Location: Arm Pain Orientation: Left Pain Intervention(s): Repositioned   See Function Navigator for Current Functional Status.   Therapy/Group: Individual Therapy  Donicia Druck E TucLorie Phenix10:01 AM

## 2016-06-23 NOTE — Progress Notes (Signed)
Subjective/Complaints: No issues overnite except hand ROS- no CP, SOB, N/V/D, , bladder ok Objective: Vital Signs: Blood pressure (!) 147/81, pulse 72, temperature 98.3 F (36.8 C), temperature source Oral, resp. rate 18, weight 103.4 kg (228 lb 1.1 oz), SpO2 99 %. No results found. Results for orders placed or performed during the hospital encounter of 06/19/16 (from the past 72 hour(s))  VITAMIN D 25 Hydroxy (Vit-D Deficiency, Fractures)     Status: Abnormal   Collection Time: 06/21/16 10:39 AM  Result Value Ref Range   Vit D, 25-Hydroxy 25.6 (L) 30.0 - 100.0 ng/mL    Comment: (NOTE) Vitamin D deficiency has been defined by the [place] practice guideline as a level of serum 25-OH vitamin D less than 20 ng/mL (1,2). The Endocrine Society went on to further define vitamin D insufficiency as a level between 21 and 29 ng/mL (2). 1. IOM (Institute of Medicine). 2010. Dietary reference   intakes for calcium and D. [place]: The   [employer]. 2. Holick MF, [place], Bischoff-Ferrari HA, et al.   Evaluation, treatment, and prevention of vitamin D   deficiency: an Endocrine Society clinical practice   guideline. JCEM. 2011 Jul; 96(7):1911-30. Performed At: [facility] [place], [place] [id number] Lindon Romp MD QZ:[id number]      HEENT: normal Cardio: RRR and no murmur Resp: RRR and no murmur GI: BS positive and NT, ND Extremity:  Pulses positive and No Edema Skin:   Intact Neuro: Alert/Oriented, Cranial Nerve II-XII normal, Abnormal Sensory reduced in BLE and B hands to LT, Abnormal Motor 4/5 in BUE, 3/5 in bilateral knee ext, 4/5 in ankle DF, Abnormal FMC Ataxic/ dec FMC and Reflexes: 3+ Musc/Skel:  Other no pain with UE or LE ROM Gen NAD   Assessment/Plan: 1. Functional deficits secondary to CNS inflammatory demyelinating disease which require 3+ hours per day of interdisciplinary  therapy in a comprehensive inpatient rehab setting. Physiatrist is providing close team supervision and 24 hour management of active medical problems listed below. Physiatrist and rehab team continue to assess barriers to discharge/monitor patient progress toward functional and medical goals. FIM: Function - Bathing Position: Wheelchair/chair at sink Body parts bathed by patient: Right arm, Left lower leg, Left arm, Chest, Abdomen, Front perineal area, Buttocks, Right upper leg, Left upper leg, Right lower leg, Back Body parts bathed by helper: Back Assist Level: Supervision or verbal cues  Function- Upper Body Dressing/Undressing What is the patient wearing?: Bra, Pull over shirt/dress Bra - Perfomed by patient: Thread/unthread right bra strap, Thread/unthread left bra strap, Hook/unhook bra (pull down sports bra) Pull over shirt/dress - Perfomed by patient: Thread/unthread right sleeve, Thread/unthread left sleeve, Put head through opening, Pull shirt over trunk Assist Level: More than reasonable time Set up : To obtain clothing/put away Function - Lower Body Dressing/Undressing What is the patient wearing?: Underwear, Pants, Non-skid slipper socks Position: Wheelchair/chair at sink Underwear - Performed by patient: Thread/unthread left underwear leg, Thread/unthread right underwear leg, Pull underwear up/down Pants- Performed by patient: Thread/unthread right pants leg, Thread/unthread left pants leg, Pull pants up/down Pants- Performed by helper: Thread/unthread right pants leg Non-skid slipper socks- Performed by patient: Don/doff right sock, Don/doff left sock Assist for footwear: Independent Assist for lower body dressing: Supervision or verbal cues  Function - Toileting Toileting steps completed by patient: Adjust clothing prior to toileting, Performs perineal hygiene, Adjust clothing after toileting Toileting Assistive Devices: Grab bar or rail  Assist level: Supervision or  verbal cues  Function - Toilet Transfers Toilet transfer assistive device: Grab bar, Walker Assist level to toilet: Touching or steadying assistance (Pt > 75%) Assist level from toilet: Touching or steadying assistance (Pt > 75%)  Function - Chair/bed transfer Chair/bed transfer method: Stand pivot Chair/bed transfer assist level: Supervision or verbal cues Chair/bed transfer assistive device: Walker  Function - Locomotion: Wheelchair Type: Manual Max wheelchair distance: 176ft  Assist Level: Touching or steadying assistance (Pt > 75%) Assist Level: Touching or steadying assistance (Pt > 75%) Assist Level: Touching or steadying assistance (Pt > 75%) Turns around,maneuvers to table,bed, and toilet,negotiates 3% grade,maneuvers on rugs and over doorsills: No Function - Locomotion: Ambulation Assistive device: No device Max distance: 169ft  Assist level: Supervision or verbal cues Assist level: Supervision or verbal cues Assist level: Supervision or verbal cues Walk 150 feet activity did not occur: Safety/medical concerns Assist level: Supervision or verbal cues Walk 10 feet on uneven surfaces activity did not occur: Safety/medical concerns  Function - Comprehension Comprehension: Auditory Comprehension assist level: Follows complex conversation/direction with extra time/assistive device  Function - Expression Expression: Verbal Expression assist level: Expresses complex ideas: With extra time/assistive device  Function - Social Interaction Social Interaction assist level: Interacts appropriately with others with medication or extra time (anti-anxiety, antidepressant).  Function - Problem Solving Problem solving assist level: Solves complex 90% of the time/cues < 10% of the time  Function - Memory Memory assist level: Recognizes or recalls 90% of the time/requires cueing < 10% of the time Patient normally able to recall (first 3 days only): Current season, Location of own  room, Staff names and faces, That he or she is in a hospital  Medical Problem List and Plan: 1.  Gait deficits, sensory loss related to demyelinating disease secondary to multiple sclerosis versus cervical myelitis.Cont CIR PT, OT, 2.  DVT Prophylaxis/Anticoagulation: Subcutaneous Lovenox. Check vascular study 3. Pain Management including neuropathic pain: Ultram as needed, trial Cymbalta , add capsaicin 4. Mood: Provide emotional support 5. Neuropsych: This patient is capable of making decisions on her own behalf. 6. Skin/Wound Care: Routine skin checks 7. Fluids/Electrolytes/Nutrition: Routine I&O with follow-up chemistries 8. Neurogenic bowel and bladder. Establish bowel program. Check PVR 3 9. Chronic anemia. Follow-up CBC,Hgb 11.3 10. Leukocytosis: Resolved likely steroid, afebrile 11. Steroid induced hyperglycemia:improved on taper CBG (last 3)  No results for input(s): GLUCAP in the last 72 hours.  12.  Hypoalbuminemia- prostat !3.  Mild vit D deficiency supplement D3 400IU BID  LOS (Days) 4 A FACE TO FACE EVALUATION WAS PERFORMED  Tamara [name] E 06/23/2016, 8:08 AM

## 2016-06-23 NOTE — Progress Notes (Signed)
Occupational Therapy Session Note  Patient Details  Name: Tamara [name] MRN: [id number] Date of Birth: 05-Jun-1960  Today's Date: 06/23/2016 OT Individual Time: 3383-2919 OT Individual Time Calculation (min): 60 min    Short Term Goals: Week 1:  OT Short Term Goal 1 (Week 1): STG=LTG due to LOS  Skilled Therapeutic Interventions/Progress Updates:    Pt seen for OT ADL bathing/dressing session. Pt sitting EOB upon arrival, set-up with breakfast tray. Practiced opening containers and using B UEs simultaneously to open containers and cut food. Educated regarding importance of normalizing movement patterns during functional tasks. Pt describes hands as "getting stuck" when in grasping position, displaying difficulty with control to release object in hand. Pt able to self feed with R UE using standard utensil with built up gripper.  She ambulated throughout session without AD and supervision to gather clothing items and towels in prep for bathing task. VCs for awareness of foot position as pt's sock half way off foot without awareness. Bathing completed from chair level at sink, she required increased time and effort for management of self-care containers. Pt displays good problem solving abilities in regarding to how to compensate for decreased sensation. Standing completed with supervision to complete LB dressing with supervision.  Discussed throughout session d/c planning, recommending pt use RW or BSC at bedside at d/c during night when ambulating to bathroom due to decreased sensation and increase of spasms following relaxed position.  Pt left seated in w/c at end of session, all needs in reach.   Therapy Documentation Precautions:  Precautions Precautions: Fall Restrictions Weight Bearing Restrictions: No  See Function Navigator for Current Functional Status.   Therapy/Group: Individual Therapy  Lewis, Shimshon Narula C 06/23/2016, 7:04 AM

## 2016-06-24 ENCOUNTER — Inpatient Hospital Stay (HOSPITAL_COMMUNITY): Payer: BLUE CROSS/BLUE SHIELD | Admitting: Physical Therapy

## 2016-06-24 ENCOUNTER — Inpatient Hospital Stay (HOSPITAL_COMMUNITY): Payer: BLUE CROSS/BLUE SHIELD | Admitting: Occupational Therapy

## 2016-06-24 NOTE — Progress Notes (Signed)
Occupational Therapy Session Note  Patient Details  Name: Tamara [name] MRN: [id number] Date of Birth: 13-Dec-1960  Today's Date: 06/24/2016 OT Individual Time: 3532-9924 OT Individual Time Calculation (min): 60 min    Short Term Goals: Week 1:  OT Short Term Goal 1 (Week 1): STG=LTG due to LOS  Skilled Therapeutic Interventions/Progress Updates:    Pt seen for OT ADL bathing/dressing session. Pt in supine upon arrival with MD present completing morning [name]. Pt agreeable to session, cont with complaints of nerve pain "all over", however, wanting to cont with therapy.  She ambulated throughout room with rollator, demonstrates good safety awareness and management during functional contexts.  She bathed seated on shower chair, sit<> stand with use of grab bars to complete pericare/buttock hygiene, supervision for set-up assist only. She requires increased time for bathing and dressing tasks due to impaired sensation and decreased coordination with hands. She completed grooming tasks standing at sink, demonstrating good safety awareness due to decreased sensation.  Pt left seated in w/c at end of session, all needs in reach. Cont to educate regarding need to call for assist with mobility beyond w/c level, pt voiced understanding.   Therapy Documentation Precautions:  Precautions Precautions: Fall Restrictions Weight Bearing Restrictions: No  See Function Navigator for Current Functional Status.   Therapy/Group: Individual Therapy  Lewis, Marolyn Urschel C 06/24/2016, 6:36 AM

## 2016-06-24 NOTE — Progress Notes (Signed)
Occupational Therapy Session Note  Patient Details  Name: Tamara [name] MRN: [id number] Date of Birth: 17-Mar-1960  Today's Date: 06/24/2016 OT Individual Time: 1330-1430 OT Individual Time Calculation (min): 60 min    Short Term Goals: Week 1:  OT Short Term Goal 1 (Week 1): STG=LTG due to LOS  Skilled Therapeutic Interventions/Progress Updates:    Pt seen for OT therapy focusing on functional mobility and neuro re-ed with emphasis on perceived sensation and hyper sensitivity. Pt asleep in supine upon arrival, easily awoken and eager for tx session. She ambulated throughout room with rollator and distant supervision, completed toileting task mod I. She ambulated to ADL apartment and compelted kitchen mobility tasks with VCs for use and positiong of rollator in functional context. And practiced removing and replacing items from overhead cabinet, demonstrated good control and grasp/ releasing abilities.  In therapy gym, completed fine motor activity utilizing puzzle pieces focusing on pt's in hand manipulation skills and tolerance to varying textures. Pt voiced increased "elecrtical feelings" when manipulating plastic puzzle pieces. She was able to manipulate and place pieces with slightly increased time.  Provided pt with assortment of textures including que tip, tooth brush, toothed comb, and theraputty focusing on introduction and tolerance of varying textures. Pt stated some digits the sensation felt soft while other digits it felt "like needles" with each stimulant, perceived sensations appear random and do not follow dermatome/ nerve innervation mapping. Encouraged pt to cont to with applying various stimuli to finger tips to address hypersensitivity.  Pt returned to room at end of session, left seated EOB with all needs in reach.   Therapy Documentation Precautions:  Precautions Precautions: Fall Restrictions Weight Bearing Restrictions: No  See Function Navigator for Current Functional  Status.   Therapy/Group: Individual Therapy  Lewis, Kenniya Westrich C 06/24/2016, 2:07 PM

## 2016-06-24 NOTE — Progress Notes (Signed)
Physical Therapy Session Note  Patient Details  Name: Tamara [name] MRN: [id number] Date of Birth: Sep 29, 1960  Today's Date: 06/24/2016 PT Individual Time: 4627-0350 PT Individual Time Calculation (min): 72 min   Short Term Goals: Week 1:  PT Short Term Goal 1 (Week 1): Pt will be able to perform transfers with supervision PT Short Term Goal 2 (Week 1): Pt will be able to gait x 100' with supervision PT Short Term Goal 3 (Week 1): Pt will be able to perform stairs without rails with min assist for home entry  Skilled Therapeutic Interventions/Progress Updates:   Pt received sitting in WC and agreeable to PT.   Pt instructed in gait training with Rollator x 261f, 1563f [id number] provided supervision assist from PT for safety.   Curb training for BLE strengthening and coordination to step up/over 4 inch x 5BLE step as well as lateral step ups and x5 BLE. Close supervision assist from PT with min cues for proper step length and improved posture.   Dynamic gait training without AD to weave through 6 cones x 2 as well as 2 bouts of stepping over 4 obstacles (1-6 inches). PT provided contact guard assist for stability while stepping over taller obstacles as well as supervision assist while weaving around cones. Min cues for stepping strategy and improved weight shift to prevent LOB.  Tandem gait over balance bean 4x 20ft82fh min assist from PT. Min cues for improved selective attention to task. Pt noted to have increased success when limiting distractions while on balance beam. Side stepping on balance beam with min assist from PT x 20ft 12fterally. Cues fo use of hip strategy to prevent posterior LOB.  PT instructed pt in simulated home environment to carry box 2x 40 on level surface while unable to visualize BLE. Gait across unlevel surface with supervision assist from PT for safety 2x 10 ft.   PT instructed pt in car transfer x 2 with no AD and Supervision assist. Min cues for  sit>pivot technique as well as proper UE support.   Patient returned too room and [facility] in reach and all needs met.          Therapy Documentation Precautions:  Precautions Precautions: Fall Restrictions Weight Bearing Restrictions: No    Pain: Pain Assessment Pain Assessment: 0-10 Pain Score:  (per pt pain scale a 12 at all times) Pain Type: Neuropathic pain Pain Location: Hand Pain Orientation: Right;Left Pain Descriptors / Indicators: Tingling;Numbness Pain Onset: On-going Pain Intervention(s): Medication (See eMAR) (schedule medications)   See Function Navigator for Current Functional Status.   Therapy/Group: Individual Therapy  AustinLorie Phenix2018, 11:02 AM

## 2016-06-24 NOTE — Progress Notes (Signed)
   Subjective/Complaints: She feels ok No complaints She is cheerful and appetite is ok Sleep is poor 2-3 hours/night is her estimate Objective: Vital Signs: Blood pressure 106/74, pulse 60, temperature 99.1 F (37.3 C), temperature source Oral, resp. rate 18, weight 228 lb 1.1 oz (103.4 kg), SpO2 99 %.   Well-developed well-nourished female in no acute distress. HEENT exam atraumatic, normocephalic, extraocular muscles are intact. Neck is supple. No jugular venous distention no thyromegaly. Chest clear to auscultation without increased work of breathing. Cardiac exam S1 and S2 are regular. Abdominal exam, overweight active bowel sounds, soft, nontender. Extremities no edema. Neurologic exam she is alert without any motor sensory deficits. Gait is normal.    Assessment/Plan: 1. Functional deficits secondary to CNS inflammatory demyelinating disease   Medical Problem List and Plan: 1.  Gait deficits, sensory loss related to demyelinating disease secondary to multiple sclerosis versus cervical myelitis.Cont CIR PT, OT,  2.  DVT Prophylaxis/Anticoagulation: Subcutaneous Lovenox. Check vascular study 3. Pain Management including neuropathic pain: pain is adequately controlled.  4. Mood: Provide emotional support 5. Neuropsych: This patient is capable of making decisions on her own behalf. 6. Skin/Wound Care: Routine skin checks 7. Fluids/Electrolytes/Nutrition: Routine I&O with follow-up chemistries 8. Neurogenic bowel and bladder. Establish bowel program. Check PVR 3 9. Chronic anemia. Follow-up CBC,Hgb 11.3 10. Leukocytosis: Resolved likely steroid, afebrile 11. Steroid induced hyperglycemia:improved on taper CBG (last 3)  No results for input(s): GLUCAP in the last 72 hours.  12.  Hypoalbuminemia- prostat !3.  Mild vit D deficiency supplement D3 600-800 IU per day 14. Insomnia-- ? Steroid induced.  LOS (Days) 5 A FACE TO FACE EVALUATION WAS PERFORMED  Bruce H Swords 06/24/2016,  10:06 AM
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
## 2016-06-26 ENCOUNTER — Inpatient Hospital Stay (HOSPITAL_COMMUNITY): Payer: BLUE CROSS/BLUE SHIELD | Admitting: Occupational Therapy

## 2016-06-26 ENCOUNTER — Inpatient Hospital Stay (HOSPITAL_COMMUNITY): Payer: BLUE CROSS/BLUE SHIELD | Admitting: Physical Therapy

## 2016-06-26 LAB — CREATININE, SERUM
Creatinine, Ser: 1.08 mg/dL — ABNORMAL HIGH (ref 0.44–1.00)
GFR calc Af Amer: >60 mL/min (ref >60)
GFR calc non Af Amer: 57 mL/min — ABNORMAL LOW (ref >60)

## 2016-06-26 MED ORDER — CARBAMAZEPINE 200 MG PO TABS
200.0000 mg | ORAL_TABLET | Freq: Two times a day (BID) | ORAL | Status: DC
  Administered 2016-06-26: 200 mg via ORAL
  Filled 2016-06-26: qty 1

## 2016-06-26 MED ORDER — MAGIC MOUTHWASH W/LIDOCAINE
5.0000 mL | Freq: Four times a day (QID) | ORAL | Status: DC
  Administered 2016-06-26 – 2016-06-28 (×7): 5 mL via ORAL
  Filled 2016-06-26 (×7): qty 5

## 2016-06-26 MED ORDER — DICLOFENAC SODIUM 1 % TD GEL
2.0000 g | Freq: Three times a day (TID) | TRANSDERMAL | Status: DC
  Administered 2016-06-26 – 2016-06-28 (×7): 2 g via TOPICAL
  Filled 2016-06-26 (×2): qty 100

## 2016-06-26 NOTE — Progress Notes (Signed)
Subjective/Complaints: Hands and feet are still burning/numb. Left knee feels unsteady at times, sometimes "pops"  ROS: pt denies nausea, vomiting, diarrhea, cough, shortness of breath or chest pain   Objective: Vital Signs: Blood pressure 126/80, pulse 68, temperature 98.6 F (37 C), temperature source Oral, resp. rate 18, weight 103.4 kg (228 lb 1.1 oz), SpO2 100 %. No results found. Results for orders placed or performed during the hospital encounter of 06/19/16 (from the past 72 hour(s))  Creatinine, serum     Status: Abnormal   Collection Time: 06/26/16  6:09 AM  Result Value Ref Range   Creatinine, Ser 1.08 (H) 0.44 - 1.00 mg/dL   GFR calc non Af Amer 57 (L) >60 mL/min   GFR calc Af Amer >60 >60 mL/min    Comment: (NOTE) The eGFR has been calculated using the CKD EPI equation. This calculation has not been validated in all clinical situations. eGFR's persistently <60 mL/min signify possible Chronic Kidney Disease.      HEENT: normal Cardio:  RRR Resp: CTA bilaterally. Normal effot GI: BS positive and NT, ND Extremity:  Pulses positive and No Edema Skin:   Intact Neuro: Alert/Oriented, Cranial Nerve II-XII normal, decreased LT in hands and feet, Abnormal Motor 4/5 in BUE, 3/5 in bilateral knee ext, 4/5 in ankle DF, Abnormal FMC Ataxic/ dec FMC and Reflexes: 3+ Musc/Skel:  Other no pain with UE or LE ROM Gen NAD   Assessment/Plan: 1. Functional deficits secondary to CNS inflammatory demyelinating disease which require 3+ hours per day of interdisciplinary therapy in a comprehensive inpatient rehab setting. Physiatrist is providing close team supervision and 24 hour management of active medical problems listed below. Physiatrist and rehab team continue to assess barriers to discharge/monitor patient progress toward functional and medical goals. FIM: Function - Bathing Position: Shower Body parts bathed by patient: Right arm, Left arm, Left lower leg, Back, Chest,  Abdomen, Front perineal area, Buttocks, Right upper leg, Right lower leg, Left upper leg Body parts bathed by helper: Back Assist Level: Set up, More than reasonable time Set up : To obtain items  Function- Upper Body Dressing/Undressing What is the patient wearing?: Bra, Pull over shirt/dress Bra - Perfomed by patient: Thread/unthread right bra strap, Thread/unthread left bra strap, Hook/unhook bra (pull down sports bra) Pull over shirt/dress - Perfomed by patient: Thread/unthread right sleeve, Thread/unthread left sleeve, Put head through opening, Pull shirt over trunk Assist Level: More than reasonable time Set up : To obtain clothing/put away Function - Lower Body Dressing/Undressing What is the patient wearing?: Underwear, Pants, Non-skid slipper socks Position: Wheelchair/chair at sink Underwear - Performed by patient: Thread/unthread right underwear leg, Thread/unthread left underwear leg, Pull underwear up/down Pants- Performed by patient: Thread/unthread right pants leg, Thread/unthread left pants leg, Pull pants up/down Pants- Performed by helper: Thread/unthread right pants leg Non-skid slipper socks- Performed by patient: Don/doff right sock, Don/doff left sock Assist for footwear: Independent Assist for lower body dressing: Supervision or verbal cues  Function - Toileting Toileting steps completed by patient: Adjust clothing prior to toileting, Performs perineal hygiene, Adjust clothing after toileting Toileting Assistive Devices: Grab bar or rail Assist level: Supervision or verbal cues  Function Midwife transfer assistive device: Grab bar Assist level to toilet: Supervision or verbal cues Assist level from toilet: Supervision or verbal cues  Function - Chair/bed transfer Chair/bed transfer method: Stand pivot Chair/bed transfer assist level: Supervision or verbal cues Chair/bed transfer assistive device: Printmaker - Locomotion:  Wheelchair  Type: Manual Max wheelchair distance: 136f  Assist Level: Touching or steadying assistance (Pt > 75%) Assist Level: Touching or steadying assistance (Pt > 75%) Assist Level: Touching or steadying assistance (Pt > 75%) Turns around,maneuvers to table,bed, and toilet,negotiates 3% grade,maneuvers on rugs and over doorsills: No Function - Locomotion: Ambulation Assistive device: Walker-rolling Max distance: 2022f Assist level: Supervision or verbal cues Assist level: Supervision or verbal cues Assist level: Supervision or verbal cues Walk 150 feet activity did not occur: Safety/medical concerns Assist level: Supervision or verbal cues Walk 10 feet on uneven surfaces activity did not occur: Safety/medical concerns  Function - Comprehension Comprehension: Auditory Comprehension assist level: Follows complex conversation/direction with no assist  Function - Expression Expression: Verbal Expression assist level: Expresses complex ideas: With no assist  Function - Social Interaction Social Interaction assist level: Interacts appropriately with others - No medications needed.  Function - Problem Solving Problem solving assist level: Solves complex problems: Recognizes & self-corrects  Function - Memory Memory assist level: Complete Independence: No helper Patient normally able to recall (first 3 days only): Current season, Location of own room, Staff names and faces, That he or she is in a hospital  Medical Problem List and Plan: 1.  Gait deficits, sensory loss related to demyelinating disease secondary to multiple sclerosis versus cervical myelitis.Cont CIR PT, OT,  2.  DVT Prophylaxis/Anticoagulation: Subcutaneous Lovenox.   3. Pain Management including neuropathic pain: Ultram as needed, trial Cymbalta , added capsaicin  -will begin trial of tegretol 20022mid. (has not tolerated lyrica or gabapentin in the past) 4. Mood: Provide emotional support 5. Neuropsych: This  patient is capable of making decisions on her own behalf. 6. Skin/Wound Care: Routine skin checks 7. Fluids/Electrolytes/Nutrition: good po intake 8. Neurogenic bowel and bladder. Establish bowel program. Check PVR 3 9. Chronic anemia. Follow-up CBC,Hgb 11.3 10. Leukocytosis: Resolved likely steroid, afebrile 11. Steroid induced hyperglycemia:improved on taper CBG (last 3)  No results for input(s): GLUCAP in the last 72 hours.  12.  Hypoalbuminemia- prostat, eating well !3.  Mild vit D deficiency supplement D3 400IU BID    LOS (Days) 7 A FACE TO FACE EVALUATION WAS PERFORMED  Tamara [name] T 06/26/2016, 9:58 AM
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
## 2016-06-26 NOTE — Progress Notes (Addendum)
Per reports has had "crawling sensation and difficulty breathing" since taking tegretol this am. Did tend to clear throat frequently during conversation. speech reported to be at baseline per husband.  Oral exam with without edema in posterior pharynx or uvula.  Does have evidence of thrush that could be contributing to oral symptoms. Will add MMW. Discontinue tegretol due to her concerns of SE.

## 2016-06-26 NOTE — Progress Notes (Addendum)
Physical Therapy Session Note  Patient Details  Name: Tamara [name] MRN: [id number] Date of Birth: 07/12/1960  Today's Date: 06/26/2016 PT Individual Time: 5852-7782 PT Individual Time Calculation (min): 55 min   Short Term Goals: Week 1:  PT Short Term Goal 1 (Week 1): Pt will be able to perform transfers with supervision PT Short Term Goal 2 (Week 1): Pt will be able to gait x 100' with supervision PT Short Term Goal 3 (Week 1): Pt will be able to perform stairs without rails with min assist for home entry   Skilled Therapeutic Interventions/Progress Updates:  Pt received in room & agreeable to tx. Session focused on pt education, d/c planning, functional mobility, strengthening, and balance. Pt ambulated throughout unit & to [employer]	[place] without AD and supervision fade to mod I. Pt requires 2 standing breaks when ambulating unit[place]; educated pt on energy conservation techniques when out in the community. Discussed transition to ambulation without AD and pt agreeable to this after discussing need to have husband supervise nightly trips to bathroom if she ever felt "shaky". Pt negotiated 4 steps x 2 trials with either L or R rail as pt reports she will have at least 1 rail installed at her house. Pt able to safely negotiate stairs with supervision. Pt utilized cybex kinetron in standing without UE support with task focusing on BLE strengthening. Pt's balance challenged by having her standing on rocker board with anterior/posterior and lateral challenges to balance with close supervision. Pt reports task is a challenge since she is unable to feel her feet 2/2 numbness/nerve pain. At end of session pt left sitting on EOB in room with all needs within reach & husband present.   Therapy Documentation Precautions:  Precautions Precautions: Fall Restrictions Weight Bearing Restrictions: No  Pain: C/o 10/10 nerve pain below sternum & in hands; reports this is normal and medication doesn't  help.   See Function Navigator for Current Functional Status.   Therapy/Group: Individual Therapy  Waunita Schooner 06/26/2016, 12:23 PM
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
## 2016-06-27 ENCOUNTER — Inpatient Hospital Stay (HOSPITAL_COMMUNITY): Payer: BLUE CROSS/BLUE SHIELD | Admitting: Physical Therapy

## 2016-06-27 ENCOUNTER — Inpatient Hospital Stay (HOSPITAL_COMMUNITY): Payer: BLUE CROSS/BLUE SHIELD | Admitting: Occupational Therapy

## 2016-06-27 DIAGNOSIS — G35 Multiple sclerosis: Secondary | ICD-10-CM | POA: Diagnosis not present

## 2016-06-27 DIAGNOSIS — R269 Unspecified abnormalities of gait and mobility: Secondary | ICD-10-CM | POA: Diagnosis not present

## 2016-06-27 NOTE — Progress Notes (Signed)
Occupational Therapy Session Note  Patient Details  Name: Tamara [name] MRN: [id number] Date of Birth: 07-25-1960  Today's Date: 06/27/2016 OT Individual Time: 1416-1531 OT Individual Time Calculation (min): 75 min    Skilled Therapeutic Interventions/Progress Updates: Initially upon approach for OT therapy, patient getting vitals taken from nursing staff.   Then she offered explanation of her recent medical course and explanation of the 'sensation' in her feet and hands as well as technique for standing, walking and using hands and feet/legs though "I can't really feel them."  This session she focused on dynamic  Balance activities as well as weight bearing through both legs and completing 'wall pushups."   She stated during wall pushups weight bearing she could feel sensations in her hands but not in her feet.    As well, she completed more bilateral hand sensation tolerance  Activities.    She was left seated on her bed at the end of the session.     Therapy Documentation Precautions:  Precautions Precautions: Fall Precaution Comments: Impaired sensation B feet and hands Restrictions Weight Bearing Restrictions: No General:    Pain:did not complain of pain but stated some of the sensation tolerance activities felt "prickly" or "sort of burning."       See Function Navigator for Current Functional Status.   Therapy/Group: Individual Therapy  Alfredia Ferguson [facility] 06/27/2016, 4:50 PM

## 2016-06-27 NOTE — Discharge Summary (Signed)
Tamara [name], Tamara [name]                 ACCOUNT NO.:  [id number]  MEDICAL RECORD NO.:  [id number]  LOCATION:                                 FACILITY:  PHYSICIAN:  Charlett Blake, M.D.DATE OF BIRTH:  08-14-1960  DATE OF ADMISSION:  06/19/2016 DATE OF DISCHARGE:  06/28/2016                              DISCHARGE SUMMARY   DISCHARGE DIAGNOSES: 1. Multiple sclerosis versus cervical myelitis. 2. Subcutaneous Lovenox for deep vein thrombosis prophylaxis. 3. Pain management. 4. Neurogenic bowel and bladder. 5. Chronic anemia. 6. Steroid-induced hyperglycemia.  HISTORY OF PRESENT ILLNESS:  This is a 56 year old right-handed female with history of sciatica, irritable bowel syndrome, chronic anemia, who lives with spouse, independent prior to admission.  Husband is disabled. Presented Jun 14, 2016, with progressive numbness involving trunk from the chest including lower extremities as well as numbness involving both hands.  MRI of the brain reviewed, showed demyelination per chart, 5 cm white matter lesion concerning for chronic demyelination, dominant 9 mm right parietal lesion.  MRI of cervical spine and lumbar spine showed acute 11 mm C3 demyelinating lesion, chronic 5 mm C5 demyelinating lesion.  CSF, VZV titers negative.  Placed on intravenous Solu-Medrol per [facility].  Suspect cervical myelitis versus multiple sclerosis.  Plan follow up outpatient with Dr. Arlice Colt of [facility].  Subcutaneous Lovenox for DVT prophylaxis.  The patient was admitted for comprehensive rehab program.  PAST MEDICAL HISTORY:  See discharge diagnoses.  SOCIAL HISTORY:  Independent prior to admission, living with her husband who is disabled.  FUNCTIONAL STATUS:  Upon admission to rehab services was minimal assist, 100 feet rolling walker, supervision sit to stand, min-to-mod assist activities of daily living.  PHYSICAL EXAMINATION:  VITAL SIGNS:  Blood pressure 137/64, pulse  71, temperature 97, respirations 18. GENERAL:  This was an alert female, in no acute distress, oriented x3. HEENT:  EOMs intact. NECK:  Supple.  Nontender.  No JVD. CARDIAC:  Regular rate and rhythm.  No murmur. ABDOMEN:  Soft, nontender.  Good bowel sounds. LUNGS:  Clear to auscultation without wheeze.  REHABILITATION HOSPITAL COURSE:  The patient was admitted to inpatient rehab services with therapies initiated on a 3-hour daily basis, consisting of physical therapy, occupational therapy, and rehabilitation nursing.  The following issues were addressed during the patient's rehabilitation stay.  Pertaining to Mrs. Cookson multiple scleroses versus cervical myelitis, she had completed a course of Solu-Medrol, weaning of prednisone therapy.  She would follow up outpatient with Dr. Arlice Colt.  Pain management with use of Ultram as needed.  Trial of Cymbalta, Tegretol was attempted, but not tolerated.  Blood pressure is well controlled.  Neurogenic bowel and bladder working with establishing bowel program.  Acute on chronic anemia, latest hemoglobin 11.3.  Her blood sugars were controlled, monitored while on steroid therapy.  The patient received weekly collaborative interdisciplinary team conferences to discuss estimated length of stay, family teaching, any barriers to her discharge.  She ambulates throughout the unit and to the [place] tower without assistive device, supervision, modified independent.  Required some standing rest breaks, working with energy conservation techniques. Discussed transition to ambulation without assistive device, which she was agreeable  to and did very well.  She could navigate stairs supervision, car transfers supervision, gather her belongings for activities of daily living and homemaking.  Full family teaching was completed and plan discharge to home.  Discussed no driving.  DISCHARGE MEDICATIONS:  Capsaicin twice daily, vitamin D 400 units twice daily,  Voltaren Gel t.i.d. to affected area, Colace 100 mg p.o. daily, Cymbalta 20 mg p.o. daily, Protonix 40 mg daily, prednisone taper as advised, Ultram 50 mg p.o. every 6 hours as needed pain.  DIET:  Regular.  FOLLOWUP:  She would follow up with Dr. Alysia Penna at the outpatient rehab service office as directed; Dr. [name] medical management; Dr. Arlice Colt to continue for evaluation of multiple sclerosis.  SPECIAL INSTRUCTIONS:  No driving.     Lauraine Rinne, P.A.   ______________________________ Charlett Blake, M.D.    DA/MEDQ  D:  06/27/2016  T:  06/27/2016  Job:  [id number]  cc:   [name], MD Arlice Colt, MD Charlett Blake, M.D.

## 2016-06-27 NOTE — Progress Notes (Signed)
Occupational Therapy Session Note  Patient Details  Name: Tamara [name] MRN: [id number] Date of Birth: 1960-04-29  Today's Date: 06/27/2016 OT Individual Time: 2336-1224 OT Individual Time Calculation (min): 60 min    Short Term Goals: Week 1:  OT Short Term Goal 1 (Week 1): STG=LTG due to LOS  Skilled Therapeutic Interventions/Progress Updates:    Pt seen for OT ADL bathing/dressing session. Pt in supine upon arrival, telling therapist about "rough night" due to side effects of mew medication. Pt now more apprehensive regarding going up at she required increased assist to get to toilet last night due to "tremor" like feelings. MD arrived for morning Raneisha Bress and discussed medications and answered pt's questions. Pt agreeable to tx session. Noted still to have decreased standing and walking balance without assist device, required CGA to ambulate to bathroom with pt reaching for furniture to steady self with. Used rollator for remainder of session at  mod I level. She bathed seated on shower chair with distant supervision- mod I level. She dressed seated on shower chair mod I and stood at sink to complete grooming tasks.  Completed 9 hole peg test, see results below. Pt left standing at sink completing grooming tasks at end of session. Pt made Mod I in room at end of session in prep for d/c home tomorrow. Made aware to call for nursing assistance if needed and to use rollator if feeling unstable.  9 Hole Peg Test R: 30.62 and 22.59 L: 50.60 and 42.92  Therapy Documentation Precautions:  Precautions Precautions: Fall Restrictions Weight Bearing Restrictions: No Pain:   no/ denies pain  See Function Navigator for Current Functional Status.   Therapy/Group: Individual Therapy  Lewis, Aqueelah Cotrell C 06/27/2016, 7:09 AM

## 2016-06-27 NOTE — Discharge Summary (Signed)
Discharge summary job [phone]

## 2016-06-27 NOTE — Progress Notes (Signed)
Social Work  Discharge Note  The overall goal for the admission was met for:   Discharge location: Yes-HOME WITH HUSBAND WHO CAN PROVIDE 24 HR SUPERVISION  Length of Stay: Yes-9 DAYS  Discharge activity level: Yes-MOD/I LEVEL  Home/community participation: Yes  Services provided included: MD, RD, PT, OT, SLP, RN, CM, Pharmacy, Neuropsych and SW  Financial Services: Private Insurance: [employer]  Follow-up services arranged: Outpatient: [facility]-PT & OT-JUNE 4 7:45-9:30 AM, DME: [place] and Patient/Family has no preference for HH/DME agencies  Comments (or additional information):PT DID VERY WELL AND PROGRESSED QUICKLY, HUSBAND HAS BEEN HERE AND ATTENDED THERAPIES WITH PT.   Patient/Family verbalized understanding of follow-up arrangements: Yes  Individual responsible for coordination of the follow-up plan: SELF & STEVEN-HUSBAND  Confirmed correct DME delivered: Elease Hashimoto 06/27/2016    Elease Hashimoto

## 2016-06-27 NOTE — Progress Notes (Signed)
Occupational Therapy Discharge Summary  Patient Details  Name: Tamara [name] MRN: [id number] Date of Birth: [birth year]   Patient has met 34 of 39 long term goals due to improved activity tolerance, improved balance, postural control, functional use of  RIGHT upper and LEFT upper extremity and improved coordination.  Patient to discharge at overall Modified Independent level with supervision for shower transfers. Depending on fatigue, nerve pain, and pt's anxiety level she will use rollator for functional ambulation vs no AD if feeling well.    Recommendation:  Patient will benefit from ongoing skilled OT services in outpatient setting to continue to advance functional skills in the area of BADL and iADL.  Equipment: Shower chair  Reasons for discharge: treatment goals met and discharge from hospital  Patient/family agrees with progress made and goals achieved: Yes  OT Discharge Precautions/Restrictions  Precautions Precautions: Fall Precaution Comments: Impaired sensation B feet and hands Restrictions Weight Bearing Restrictions: No Vision Baseline Vision/History: Wears glasses Wears Glasses: At all times Patient Visual Report: No change from baseline Vision Assessment?: No apparent visual deficits Perception  Perception: Within Functional Limits Praxis Praxis: Intact Cognition Overall Cognitive Status: History of cognitive impairments - at baseline Arousal/Alertness: Awake/alert Orientation Level: Oriented X4 Memory: Impaired Memory Impairment: Decreased short term memory Decreased Short Term Memory: Verbal complex;Functional complex Problem Solving: Appears intact Safety/Judgment: Appears intact Sensation Sensation Light Touch: Impaired Detail Light Touch Impaired Details: Impaired RUE;Impaired LUE;Impaired RLE;Impaired LLE Stereognosis: Impaired Detail Stereognosis Impaired Details: Impaired RUE;Impaired LUE Hot/Cold: Impaired by gross assessment Proprioception:  Impaired by gross assessment Proprioception Impaired Details: Impaired LLE;Impaired RLE;Impaired RUE;Impaired LUE Additional Comments: Reports "stinging, electrical" feelings in B hands Coordination Gross Motor Movements are Fluid and Coordinated: No Fine Motor Movements are Fluid and Coordinated: No 9 Hole Peg Test: R: 30.62 and 22.59   L50.6 seconds and 42.92 Motor  Motor Motor: Within Functional Limits Motor - Discharge Observations: Sesnsory impairments affecting mobility though much improved since admission Trunk/Postural Assessment  Cervical Assessment Cervical Assessment: Within Functional Limits Thoracic Assessment Thoracic Assessment: Within Functional Limits Lumbar Assessment Lumbar Assessment: Within Functional Limits Postural Control Postural Control: Within Functional Limits  Balance Balance Balance Assessed: Yes Static Sitting Balance Static Sitting - Level of Assistance: 7: Independent Dynamic Sitting Balance Dynamic Sitting - Balance Support: During functional activity;Feet supported Dynamic Sitting - Level of Assistance: 6: Modified independent (Device/Increase time);7: Independent Static Standing Balance Static Standing - Balance Support: During functional activity Static Standing - Level of Assistance: 6: Modified independent (Device/Increase time) Dynamic Standing Balance Dynamic Standing - Balance Support: During functional activity Dynamic Standing - Level of Assistance: 6: Modified independent (Device/Increase time) Dynamic Standing - Comments: Standing to complete ADL tasks Extremity/Trunk Assessment RUE Assessment RUE Assessment: Within Functional Limits LUE Assessment LUE Assessment: Within Functional Limits   See Function Navigator for Current Functional Status.  Lewis, Wesleigh Markovic C 06/27/2016, 9:34 AM

## 2016-06-27 NOTE — Progress Notes (Signed)
Physical Therapy Discharge Summary  Patient Details  Name: Tamara [name] MRN: [id number] Date of Birth: 1960/06/02  Today's Date: 06/27/2016      Patient has met 7 of 7 long term goals due to improved activity tolerance, improved balance, improved postural control, increased strength and improved coordination. Patient to discharge at an ambulatory level Modified Independent.   Patient's care partner is independent to provide the necessary physical assistance at discharge.  Reasons goals not met: N/A  Recommendation:  Patient will benefit from ongoing skilled PT services in outpatient setting to continue to advance safe functional mobility, address ongoing impairments in balance, endurance, strength, coordination, sensation, and minimize fall risk.  Equipment: No equipment provided  Reasons for discharge: treatment goals met  Patient/family agrees with progress made and goals achieved: Yes  PT Discharge Precautions/Restrictions Precautions Precautions: Fall Precaution Comments: Impaired sensation B feet and hands Restrictions Weight Bearing Restrictions: No Vision/Perception  Perception Perception: Within Functional Limits Praxis Praxis: Intact  Cognition Overall Cognitive Status: History of cognitive impairments - at baseline Arousal/Alertness: Awake/alert Orientation Level: Oriented X4 Memory: Impaired Memory Impairment: Decreased short term memory Decreased Short Term Memory: Verbal complex;Functional complex Problem Solving: Appears intact Safety/Judgment: Appears intact Sensation Sensation Light Touch: Impaired Detail Light Touch Impaired Details: Impaired RUE;Impaired LUE;Impaired RLE;Impaired LLE Stereognosis: Impaired Detail Stereognosis Impaired Details: Impaired RUE;Impaired LUE Hot/Cold: Impaired by gross assessment Proprioception: Impaired by gross assessment Proprioception Impaired Details: Impaired LLE;Impaired RLE;Impaired RUE;Impaired LUE Additional  Comments: Reports "stinging, electrical" feelings in B hands Coordination Gross Motor Movements are Fluid and Coordinated: No Fine Motor Movements are Fluid and Coordinated: No 9 Hole Peg Test: R: 30.62 and 22.59   L50.6 seconds and 42.92 Motor  Motor Motor: Within Functional Limits Motor - Discharge Observations: Sesnsory impairments affecting mobility though much improved since admission  Mobility   modified independent for all bed mobility and transfers.  Locomotion  Ambulation Ambulation: Yes Ambulation/Gait Assistance: 6: Modified independent (Device/Increase time) Ambulation Distance (Feet): 250 Feet Assistive device: None Gait Gait: Yes Stairs / Additional Locomotion Stairs: Yes Stair Management Technique: No rails Number of Stairs: 12 Height of Stairs: 6 Ramp: 6: Modified independent (Device)  Trunk/Postural Assessment  Cervical Assessment Cervical Assessment: Within Functional Limits Thoracic Assessment Thoracic Assessment: Within Functional Limits Lumbar Assessment Lumbar Assessment: Within Functional Limits Postural Control Postural Control: Within Functional Limits  Balance Balance Balance Assessed: Yes Static Sitting Balance Static Sitting - Level of Assistance: 7: Independent Dynamic Sitting Balance Dynamic Sitting - Balance Support: During functional activity;Feet supported Dynamic Sitting - Level of Assistance: 6: Modified independent (Device/Increase time);7: Independent Static Standing Balance Static Standing - Balance Support: During functional activity Static Standing - Level of Assistance: 6: Modified independent (Device/Increase time) Dynamic Standing Balance Dynamic Standing - Balance Support: During functional activity Dynamic Standing - Level of Assistance: 6: Modified independent (Device/Increase time) Dynamic Standing - Comments: Standing to complete ADL tasks Extremity Assessment  RUE Assessment RUE Assessment: Within Functional  Limits LUE Assessment LUE Assessment: Within Functional Limits       See Function Navigator for Current Functional Status.  Rosita DeChalus 06/27/2016, 9:52 AM

## 2016-06-27 NOTE — Discharge Instructions (Signed)
Inpatient Rehab Discharge Instructions  Eleanore Junio Discharge date and time: No discharge date for patient encounter.   Activities/Precautions/ Functional Status: Activity: activity as tolerated Diet: regular diet Wound Care: none needed Functional status:  ___ No restrictions     ___ Walk up steps independently ___ 24/7 supervision/assistance   ___ Walk up steps with assistance ___ Intermittent supervision/assistance  ___ Bathe/dress independently ___ Walk with walker     _x__ Bathe/dress with assistance ___ Walk Independently    ___ Shower independently ___ Walk with assistance    ___ Shower with assistance ___ No alcohol     ___ Return to work/school ________  Special Instructions:    COMMUNITY REFERRALS UPON DISCHARGE:    Outpatient: PT & OT  Agency:[facility] Phone:[phone]   Date of Last Service:06/28/2016  Appointment Date/Time:JUNE 4 Monday 7:45-9:30 AM  Medical Equipment/Items Ordered:ROLLATOR [facility] WALKER & TUB SEAT WITH BACK  Agency/Supplier:[facility]   [phone]   GENERAL COMMUNITY RESOURCES FOR PATIENT/FAMILY: Support Groups:[employer] [address] [place] [address] [phone]  My questions have been answered and I understand these instructions. I will adhere to these goals and the provided educational materials after my discharge from the hospital.  Patient/Caregiver Signature _______________________________ Date __________  Clinician Signature _______________________________________ Date __________  Please bring this form and your medication list with you to all your follow-up doctor's appointments.

## 2016-06-27 NOTE — Progress Notes (Signed)
Subjective/Complaints: Did not do well with tegretol. Caused tremors, breathing problems. Still feels she's having effects this morning.   ROS: pt denies nausea, vomiting, diarrhea, cough, shortness of breath or chest pain   Objective: Vital Signs: Blood pressure 121/76, pulse 71, temperature 98.5 F (36.9 C), temperature source Oral, resp. rate 18, weight 103.4 kg (228 lb 1.1 oz), SpO2 100 %. No results found. Results for orders placed or performed during the hospital encounter of 06/19/16 (from the past 72 hour(s))  Creatinine, serum     Status: Abnormal   Collection Time: 06/26/16  6:09 AM  Result Value Ref Range   Creatinine, Ser 1.08 (H) 0.44 - 1.00 mg/dL   GFR calc non Af Amer 57 (L) >60 mL/min   GFR calc Af Amer >60 >60 mL/min    Comment: (NOTE) The eGFR has been calculated using the CKD EPI equation. This calculation has not been validated in all clinical situations. eGFR's persistently <60 mL/min signify possible Chronic Kidney Disease.      HEENT: normal Cardio:  RRR Resp: CTA bilaterally. Normal effot GI: BS positive and NT, ND Extremity:  Pulses positive and No Edema Skin:   Intact Neuro: Alert/Oriented, Cranial Nerve II-XII normal, decreased LT in hands and feet, Abnormal Motor 4/5 in BUE, 3/5 in bilateral knee ext, 4/5 in ankle DF, Abnormal FMC Ataxic/ dec FMC in all 4's but stable.  Reflexes: 3+ Musc/Skel:  Other no pain with UE or LE ROM Gen NAD Psych: anxious   Assessment/Plan: 1. Functional deficits secondary to CNS inflammatory demyelinating disease which require 3+ hours per day of interdisciplinary therapy in a comprehensive inpatient rehab setting. Physiatrist is providing close team supervision and 24 hour management of active medical problems listed below. Physiatrist and rehab team continue to assess barriers to discharge/monitor patient progress toward functional and medical goals. FIM: Function - Bathing Position: Shower Body parts bathed by  patient: Right arm, Left arm, Left lower leg, Back, Chest, Abdomen, Front perineal area, Buttocks, Right upper leg, Right lower leg, Left upper leg Body parts bathed by helper: Back Assist Level: More than reasonable time Set up : To obtain items  Function- Upper Body Dressing/Undressing What is the patient wearing?: Bra, Pull over shirt/dress Bra - Perfomed by patient: Thread/unthread right bra strap, Thread/unthread left bra strap, Hook/unhook bra (pull down sports bra) Pull over shirt/dress - Perfomed by patient: Thread/unthread right sleeve, Thread/unthread left sleeve, Put head through opening, Pull shirt over trunk Assist Level: More than reasonable time Set up : To obtain clothing/put away Function - Lower Body Dressing/Undressing What is the patient wearing?: Underwear, Pants, Socks, Shoes Position: Wheelchair/chair at sink Underwear - Performed by patient: Thread/unthread right underwear leg, Thread/unthread left underwear leg, Pull underwear up/down Pants- Performed by patient: Thread/unthread right pants leg, Thread/unthread left pants leg, Pull pants up/down Pants- Performed by helper: Thread/unthread right pants leg Non-skid slipper socks- Performed by patient: Don/doff right sock, Don/doff left sock Socks - Performed by patient: Don/doff right sock, Don/doff left sock Shoes - Performed by patient: Don/doff right shoe, Don/doff left shoe, Fasten right, Fasten left Assist for footwear: Independent Assist for lower body dressing: More than reasonable time  Function - Toileting Toileting steps completed by patient: Adjust clothing prior to toileting, Performs perineal hygiene, Adjust clothing after toileting Toileting steps completed by helper: Adjust clothing prior to toileting, Adjust clothing after toileting Toileting Assistive Devices: Grab bar or rail Assist level: More than reasonable time  Function Midwife transfer assistive  device: Grab bar  (Rollator) Assist level to toilet: No Help, no cues, assistive device, takes more than a reasonable amount of time Assist level from toilet: No Help, no cues, assistive device, takes more than a reasonable amount of time  Function - Chair/bed transfer Chair/bed transfer method: Stand pivot Chair/bed transfer assist level: Supervision or verbal cues Chair/bed transfer assistive device: Walker  Function - Locomotion: Wheelchair Type: Manual Max wheelchair distance: 147f  Assist Level: Touching or steadying assistance (Pt > 75%) Assist Level: Touching or steadying assistance (Pt > 75%) Assist Level: Touching or steadying assistance (Pt > 75%) Turns around,maneuvers to table,bed, and toilet,negotiates 3% grade,maneuvers on rugs and over doorsills: No Function - Locomotion: Ambulation Assistive device: No device Max distance: >200 ft Assist level: No help, No cues, assistive device, takes more than a reasonable amount of time Assist level: No help, No cues, assistive device, takes more than a reasonable amount of time Assist level: No help, No cues, assistive device, takes more than a reasonable amount of time Walk 150 feet activity did not occur: Safety/medical concerns Assist level: No help, No cues, assistive device, takes more than a reasonable amount of time Walk 10 feet on uneven surfaces activity did not occur: Safety/medical concerns  Function - Comprehension Comprehension: Auditory Comprehension assist level: Follows complex conversation/direction with extra time/assistive device  Function - Expression Expression: Verbal Expression assist level: Expresses complex ideas: With no assist  Function - Social Interaction Social Interaction assist level: Interacts appropriately with others - No medications needed.  Function - Problem Solving Problem solving assist level: Solves complex problems: With extra time  Function - Memory Memory assist level: Complete Independence: No  helper Patient normally able to recall (first 3 days only): Current season, Location of own room, Staff names and faces, That he or she is in a hospital  Medical Problem List and Plan: 1.  Gait deficits, sensory loss related to demyelinating disease secondary to multiple sclerosis versus cervical myelitis.Cont CIR PT, OT,   -pt's exam at baseline today  -encouraged her that she should be ready for discharge tomorrow 2.  DVT Prophylaxis/Anticoagulation: Subcutaneous Lovenox.   3. Pain Management including neuropathic pain: Ultram as needed, trial Cymbalta , added capsaicin  -pt did not tolerate tegretol---made her have tremors and difficulty breathing.   -suspect some behavioral component to reaction  -reiterated that she will need to allow cymbalta some time to work. Continue other meds as she's taking 4. Mood: Provide emotional support 5. Neuropsych: This patient is capable of making decisions on her own behalf. 6. Skin/Wound Care: Routine skin checks 7. Fluids/Electrolytes/Nutrition: good po intake 8. Neurogenic bowel and bladder. Establish bowel program. Check PVR 3 9. Chronic anemia. Follow-up CBC,Hgb 11.3 10. Leukocytosis: Resolved likely steroid, afebrile 11. Steroid induced hyperglycemia:improved on taper CBG (last 3)  No results for input(s): GLUCAP in the last 72 hours.  12.  Hypoalbuminemia- prostat, eating well !3.  Mild vit D deficiency supplement D3 400IU BID    LOS (Days) 8 A FACE TO FACE EVALUATION WAS PERFORMED  Tamara [name] T 06/27/2016, 9:15 AM

## 2016-06-27 NOTE — Progress Notes (Signed)
Physical Therapy Session Note  Patient Details  Name: Tamara [name] MRN: [id number] Date of Birth: 05/17/1960  Today's Date: 06/27/2016 PT Individual Time: 0900-1000 PT Individual Time Calculation (min): 60 min   Short Term Goals: Week 1:  PT Short Term Goal 1 (Week 1): Pt will be able to perform transfers with supervision PT Short Term Goal 2 (Week 1): Pt will be able to gait x 100' with supervision PT Short Term Goal 3 (Week 1): Pt will be able to perform stairs without rails with min assist for home entry  Skilled Therapeutic Interventions/Progress Updates: Pt presented sitting at EOB donning shoes. Pt agreeable to therapy. Pt ambulated to ortho gym no AD distant supervision. Performed functional activities including car transfer, ramp, gait on compliant surface all performed mod I. Pt ascend/descend stairs x 12 no rail supervision. Ambulated throughout facility distant supervision and able to answer any queries for pt  Regarding d/c. Pt returned to room with all current needs met.      Therapy Documentation Precautions:  Precautions Precautions: Fall Precaution Comments: Impaired sensation B feet and hands Restrictions Weight Bearing Restrictions: No General:   Vital Signs:   Pain: Pain Assessment Pain Assessment: 0-10 Pain Score: 2  Pain Type: Chronic pain Pain Location: Hand Pain Orientation: Right;Left Pain Descriptors / Indicators: Aching Pain Onset: On-going Patients Stated Pain Goal: 2 Pain Intervention(s): Medication (See eMAR) Multiple Pain Sites: No Mobility:   Locomotion : Ambulation Ambulation: Yes Ambulation/Gait Assistance: 6: Modified independent (Device/Increase time) Ambulation Distance (Feet): 250 Feet Assistive device: None Gait Gait: Yes Stairs / Additional Locomotion Stairs: Yes Stair Management Technique: No rails Number of Stairs: 12 Height of Stairs: 6 Ramp: 6: Modified independent (Device)  Trunk/Postural Assessment : Cervical  Assessment Cervical Assessment: Within Functional Limits Thoracic Assessment Thoracic Assessment: Within Functional Limits Lumbar Assessment Lumbar Assessment: Within Functional Limits Postural Control Postural Control: Within Functional Limits  Balance: Balance Balance Assessed: Yes Static Sitting Balance Static Sitting - Level of Assistance: 7: Independent Dynamic Sitting Balance Dynamic Sitting - Balance Support: During functional activity;Feet supported Dynamic Sitting - Level of Assistance: 6: Modified independent (Device/Increase time);7: Independent Static Standing Balance Static Standing - Balance Support: During functional activity Static Standing - Level of Assistance: 6: Modified independent (Device/Increase time) Dynamic Standing Balance Dynamic Standing - Balance Support: During functional activity Dynamic Standing - Level of Assistance: 6: Modified independent (Device/Increase time) Dynamic Standing - Comments: Standing to complete ADL tasks   See Function Navigator for Current Functional Status.   Therapy/Group: Individual Therapy      , PTA  06/27/2016, 11:55 AM  

## 2016-06-28 MED ORDER — TRAMADOL HCL 50 MG PO TABS: 50.0000 mg | ORAL_TABLET | Freq: Four times a day (QID) | ORAL | 0 refills | Status: DC | PRN

## 2016-06-28 MED ORDER — PRO-STAT SUGAR FREE PO LIQD: 30.0000 mL | Freq: Two times a day (BID) | ORAL | 0 refills | Status: DC

## 2016-06-28 MED ORDER — CAPSAICIN 0.025 % EX CREA: TOPICAL_CREAM | Freq: Two times a day (BID) | CUTANEOUS | 0 refills | Status: DC

## 2016-06-28 MED ORDER — DICLOFENAC SODIUM 1 % TD GEL: 2.0000 g | Freq: Three times a day (TID) | TRANSDERMAL | 0 refills | Status: DC

## 2016-06-28 MED ORDER — ACETAMINOPHEN 325 MG PO TABS: 650.0000 mg | ORAL_TABLET | Freq: Four times a day (QID) | ORAL | Status: DC | PRN

## 2016-06-28 MED ORDER — PREDNISONE 5 MG PO TABS: ORAL_TABLET | ORAL | 0 refills | Status: DC

## 2016-06-28 MED ORDER — DOCUSATE SODIUM 100 MG PO CAPS: 100.0000 mg | ORAL_CAPSULE | Freq: Every day | ORAL | 0 refills | Status: DC

## 2016-06-28 MED ORDER — DULOXETINE HCL 20 MG PO CPEP: 20.0000 mg | ORAL_CAPSULE | Freq: Every day | ORAL | 3 refills | Status: DC

## 2016-06-28 MED ORDER — PANTOPRAZOLE SODIUM 40 MG PO TBEC: 40.0000 mg | DELAYED_RELEASE_TABLET | Freq: Every day | ORAL | 0 refills | Status: DC

## 2016-06-28 NOTE — Plan of Care (Signed)
Problem: RH PAIN MANAGEMENT Goal: RH STG PAIN MANAGED AT OR BELOW PT'S PAIN GOAL Per patient report manage neuropathic pain to 2 out of 10.    Outcome: Not Met (add Reason) Pain scale reported as 10/10 at times  Comments: Patient still reports a pain scale score of 10/10 at times. Still has numbness to all extremities. Reports less pain this shift, but states that Tamara [name] still his trouble feeling, especially in the digits. Will continue to monitor & treat as needed

## 2016-06-28 NOTE — Progress Notes (Signed)
Subjective/Complaints: Did well yesterday. Mod I in room without any problems  ROS: pt denies nausea, vomiting, diarrhea, cough, shortness of breath or chest pain   Objective: Vital Signs: Blood pressure 125/83, pulse 70, temperature 98.1 F (36.7 C), temperature source Oral, resp. rate 18, weight 100.5 kg (221 lb 9.6 oz), SpO2 100 %. No results found. Results for orders placed or performed during the hospital encounter of 06/19/16 (from the past 72 hour(s))  Creatinine, serum     Status: Abnormal   Collection Time: 06/26/16  6:09 AM  Result Value Ref Range   Creatinine, Ser 1.08 (H) 0.44 - 1.00 mg/dL   GFR calc non Af Amer 57 (L) >60 mL/min   GFR calc Af Amer >60 >60 mL/min    Comment: (NOTE) The eGFR has been calculated using the CKD EPI equation. This calculation has not been validated in all clinical situations. eGFR's persistently <60 mL/min signify possible Chronic Kidney Disease.      HEENT: normal Cardio:  RRR Resp: clear to auscultation GI: BS positive and NT, ND Extremity:  Pulses positive and No Edema Skin:   Intact Neuro: Alert/Oriented, Cranial Nerve II-XII normal, decreased LT in hands and feet, Abnormal Motor 4/5 in BUE, 3/5 in bilateral knee ext, 4/5 in ankle DF, Abnormal FMC Ataxic/ dec FMC in all 4's but stable.  Reflexes: 3+ Musc/Skel:  Other no pain with UE or LE ROM Gen NAD Psych: anxious but pleasant   Assessment/Plan: 1. Functional deficits secondary to CNS inflammatory demyelinating disease which require 3+ hours per day of interdisciplinary therapy in a comprehensive inpatient rehab setting. Physiatrist is providing close team supervision and 24 hour management of active medical problems listed below. Physiatrist and rehab team continue to assess barriers to discharge/monitor patient progress toward functional and medical goals. FIM: Function - Bathing Position: Shower Body parts bathed by patient: Right arm, Left arm, Left lower leg, Back,  Chest, Abdomen, Front perineal area, Buttocks, Right upper leg, Right lower leg, Left upper leg Body parts bathed by helper: Back Assist Level: More than reasonable time Set up : To obtain items  Function- Upper Body Dressing/Undressing What is the patient wearing?: Bra, Pull over shirt/dress Bra - Perfomed by patient: Thread/unthread right bra strap, Thread/unthread left bra strap, Hook/unhook bra (pull down sports bra) Pull over shirt/dress - Perfomed by patient: Thread/unthread right sleeve, Thread/unthread left sleeve, Put head through opening, Pull shirt over trunk Assist Level: More than reasonable time Set up : To obtain clothing/put away Function - Lower Body Dressing/Undressing What is the patient wearing?: Underwear, Pants, Socks, Shoes Position: Wheelchair/chair at sink Underwear - Performed by patient: Thread/unthread right underwear leg, Thread/unthread left underwear leg, Pull underwear up/down Pants- Performed by patient: Thread/unthread right pants leg, Thread/unthread left pants leg, Pull pants up/down Pants- Performed by helper: Thread/unthread right pants leg Non-skid slipper socks- Performed by patient: Don/doff right sock, Don/doff left sock Socks - Performed by patient: Don/doff right sock, Don/doff left sock Shoes - Performed by patient: Don/doff right shoe, Don/doff left shoe, Fasten right, Fasten left Assist for footwear: Independent Assist for lower body dressing: More than reasonable time  Function - Toileting Toileting steps completed by patient: Adjust clothing prior to toileting, Performs perineal hygiene, Adjust clothing after toileting Toileting steps completed by helper: Adjust clothing prior to toileting, Adjust clothing after toileting Toileting Assistive Devices: Grab bar or rail Assist level: More than reasonable time  Function - Air cabin crew transfer assistive device: Grab bar Assist level to toilet:  No Help, No cues Assist level from  toilet: No Help, No cues  Function - Chair/bed transfer Chair/bed transfer method: Stand pivot Chair/bed transfer assist level: Supervision or verbal cues Chair/bed transfer assistive device: Walker  Function - Locomotion: Wheelchair Type: Manual Max wheelchair distance: 176f  Assist Level: Touching or steadying assistance (Pt > 75%) Assist Level: Touching or steadying assistance (Pt > 75%) Assist Level: Touching or steadying assistance (Pt > 75%) Turns around,maneuvers to table,bed, and toilet,negotiates 3% grade,maneuvers on rugs and over doorsills: No Function - Locomotion: Ambulation Assistive device: No device Max distance: >200 ft Assist level: No help, No cues, assistive device, takes more than a reasonable amount of time Assist level: No help, No cues, assistive device, takes more than a reasonable amount of time Assist level: No help, No cues, assistive device, takes more than a reasonable amount of time Walk 150 feet activity did not occur: Safety/medical concerns Assist level: No help, No cues, assistive device, takes more than a reasonable amount of time Walk 10 feet on uneven surfaces activity did not occur: Safety/medical concerns  Function - Comprehension Comprehension: Auditory Comprehension assist level: Follows complex conversation/direction with extra time/assistive device  Function - Expression Expression: Verbal Expression assist level: Expresses complex ideas: With extra time/assistive device  Function - Social Interaction Social Interaction assist level: Interacts appropriately with others - No medications needed.  Function - Problem Solving Problem solving assist level: Solves complex problems: With extra time  Function - Memory Memory assist level: Complete Independence: No helper Patient normally able to recall (first 3 days only): Current season, Location of own room, Staff names and faces, That he or she is in a hospital  Medical Problem List and  Plan: 1.  Gait deficits, sensory loss related to demyelinating disease secondary to multiple sclerosis versus cervical myelitis  -home today  -Patient to see MD in the office for transitional care encounter in 1-2 weeks.  -neuro follow up  -outpt therapies 2.  DVT Prophylaxis/Anticoagulation: Subcutaneous Lovenox.   3. Pain Management including neuropathic pain: Ultram as needed, trial Cymbalta , added capsaicin  -pt did not tolerate tegretol---made her have tremors and difficulty breathing.   -suspect some behavioral component to reaction   4. Mood: Provide emotional support 5. Neuropsych: This patient is capable of making decisions on her own behalf. 6. Skin/Wound Care: Routine skin checks 7. Fluids/Electrolytes/Nutrition: good po intake 8. Neurogenic bowel and bladder. Establish bowel program. Check PVR 3 9. Chronic anemia. Follow-up CBC,Hgb 11.3 10. Leukocytosis: Resolved likely steroid, afebrile 11. Steroid induced hyperglycemia:improved on taper CBG (last 3)  No results for input(s): GLUCAP in the last 72 hours.  12.  Hypoalbuminemia- prostat, eating well--would like rx for protein supp !3.  Mild vit D deficiency supplement D3 400IU BID    LOS (Days) 9 A FACE TO FACE EVALUATION WAS PERFORMED  Eutha Cude T 06/28/2016, 9:16 AM

## 2016-07-03 ENCOUNTER — Ambulatory Visit: Payer: BLUE CROSS/BLUE SHIELD | Admitting: Occupational Therapy

## 2016-07-03 ENCOUNTER — Telehealth: Payer: Self-pay | Admitting: Internal Medicine

## 2016-07-03 ENCOUNTER — Telehealth: Payer: Self-pay | Admitting: *Deleted

## 2016-07-03 ENCOUNTER — Ambulatory Visit: Payer: BLUE CROSS/BLUE SHIELD

## 2016-07-03 ENCOUNTER — Encounter: Payer: Self-pay | Admitting: Occupational Therapy

## 2016-07-03 ENCOUNTER — Encounter: Payer: Self-pay | Admitting: Rehabilitation

## 2016-07-03 ENCOUNTER — Ambulatory Visit: Payer: BLUE CROSS/BLUE SHIELD | Attending: Physical Medicine & Rehabilitation | Admitting: Rehabilitation

## 2016-07-03 DIAGNOSIS — R208 Other disturbances of skin sensation: Secondary | ICD-10-CM | POA: Insufficient documentation

## 2016-07-03 DIAGNOSIS — R2681 Unsteadiness on feet: Secondary | ICD-10-CM | POA: Diagnosis not present

## 2016-07-03 DIAGNOSIS — M6281 Muscle weakness (generalized): Secondary | ICD-10-CM | POA: Insufficient documentation

## 2016-07-03 DIAGNOSIS — M545 Low back pain, unspecified: Secondary | ICD-10-CM

## 2016-07-03 DIAGNOSIS — R2689 Other abnormalities of gait and mobility: Secondary | ICD-10-CM | POA: Diagnosis not present

## 2016-07-03 DIAGNOSIS — R278 Other lack of coordination: Secondary | ICD-10-CM | POA: Insufficient documentation

## 2016-07-03 NOTE — Therapy (Signed)
[facility] [address] [place], [place], [address] Phone: [phone]   Fax:  [phone]  Occupational Therapy Evaluation  Patient Details  Name: Tamara [name] MRN: [id number] Date of Birth: Jun 07, 1960 Referring Provider: Dr. Letta Pate  Encounter Date: 07/03/2016      OT End of Session - 07/03/16 0954    Visit Number 1   Number of Visits 8   Date for OT Re-Evaluation 07/31/16   Authorization Type [employer] - 30 visit limit for OT   OT Start Time 0846   OT Stop Time 0931   OT Time Calculation (min) 45 min   Activity Tolerance Patient tolerated treatment well      Past Medical History:  Diagnosis Date  . Anemia   . Bowel obstruction ([place])   . GERD (gastroesophageal reflux disease)   . IBS (irritable bowel syndrome)    at age of 58  . Sciatica 2009    Past Surgical History:  Procedure Laterality Date  . ABDOMINAL HYSTERECTOMY  2008   cervix and right ovary still intact  . KNEE ARTHROSCOPY  2010 and 2011   Left knee, x2    There were no vitals filed for this visit.      Subjective Assessment - 07/03/16 0850    Pertinent History see epic - newly diagnosed with MS   Currently in Pain? Yes   Pain Score 10-Worst pain ever  its higher than that -pt however able to shake my hand  and using her hands for tasks.    Pain Location Hand   Pain Orientation Right;Left   Pain Descriptors / Indicators Pins and needles;Burning;Numbness  feels like electriciity   Pain Type Acute pain   Pain Onset More than a month ago   Pain Frequency Constant   Aggravating Factors  picking up stainless steel, over exert myself,    Pain Relieving Factors nothing   Effect of Pain on Daily Activities difficult to use my hands.    Multiple Pain Sites Yes   Pain Score 10  pt is walking with no obvious gait deviations   Pain Location Foot   Pain Orientation Right;Left   Pain Descriptors / Indicators Pins and needles;Burning;Numbness    Pain Type Acute pain   Pain Onset More than a month ago   Pain Frequency Constant   Aggravating Factors  walking long distances, worse in the morning   Pain Relieving Factors getting up and moving around    Pain Score 10  its a 12   Pain Location Back   Pain Orientation Lower;Mid   Pain Descriptors / Indicators Sharp;Aching;Sore   Pain Type Acute pain   Pain Onset Yesterday   Pain Frequency Constant   Aggravating Factors  moving, anything I do    Pain Relieving Factors nothing           [facility] OT Assessment - 07/03/16 0001      Assessment   Diagnosis MS   Referring Provider Dr. Letta Pate   Onset Date 06/14/16   Prior Therapy inpt rehab PT and OT     Precautions   Precautions Fall  TBD-pt to see PT for eval today     Restrictions   Weight Bearing Restrictions No     Balance Screen   Has the patient fallen in the past 6 months No     Home  Environment   Family/patient expects to be discharged to: Private residence   Living Arrangements Spouse/significant other  41 year old  grand daughter   Available Help at Discharge Available 24 hours/day   Type of [place] Two level  pt lives on the first floor prior to diagnosis   Bathroom Shower/Tub [place] height   Additional Comments Pt has shower chair.      Prior Function   Level of Independence Independent   Vocation Retired   Leisure cakes but doesn't really like ti; reads, exercise, go to church     ADL   Eating/Feeding Modified independent  using foam on utensils   Grooming Modified independent  foam on toothbrush,    Upper Body Bathing Independent   Lower Body Bathing Modified independent  husband is in bedroom just in case   Upper Body Dressing Minimal assistance  assistance with fasteners   Lower Body Dressing Minimal assistance  assistance with fasteners, tying shoes occassionally   Toilet Transfer Independent   Toileting - Doctor, general practice -  Hygiene Independent   Tub/Shower Transfer Supervision/safety  very distant husband in bedroom     IADL   Shopping Needs to be accompanied on any shopping trip   Light Housekeeping Performs light daily tasks such as dishwashing, bed making  uses gloves to wash dishes,    Meal Prep Able to complete simple cold meal and snack prep  needs assistance to open things, pick up heavy things,chop   Community Mobility Relies on family or friends for transportation   Medication Management Is responsible for taking medication in correct dosages at correct time   Physiological scientist financial matters independently (budgets, writes checks, pays rent, bills goes to bank), collects and keeps track of income     Mobility   Mobility Status Needs assist   Mobility Status Comments sometimes pt feels more comfortable with someone with her outside, sometimes she goes outside by herself.      Written Expression   Dominant Hand Right     Vision - History   Baseline Vision Wears glasses all the time   Visual History Other (comment)  pt has block spot that was there before then gone/now back   Additional Comments Black spot in L eye that was there before, then went away for a year, then returned when she was in the hospital     Vision Assessment   Eye Alignment Within Functional Limits   Alignment/Gaze Preference Within Defined Limits   Visual Fields No apparent deficits   Comment except for black spot pt denies any visual issues     Activity Tolerance   Activity Tolerance Tolerates 30 min activity with muliple rests   Activity Tolerance Comments Pt reports if she doesn't rest she will get more numb and then dizzy     Cognition   Overall Cognitive Status Within Functional Limits for tasks assessed   Mini Mental State Exam  Pt reports she can be distracted by pain but no other issues noted. WIll  monitor      Sensation   Light Touch Impaired by gross assessment   fingers hypersensitive, palms dull   Hot/Cold Impaired by gross assessment  hypersensitive but intact   Proprioception Appears Intact     Coordination   Gross Motor Movements are Fluid and Coordinated Yes   9 Hole Peg Test Right;Left   Right 9 Hole Peg Test 30.83   Left 9 Hole Peg Test 33.41   Other I have to keep moving my hands and feet otherwise  they curl up - per pt report did not witness. No c/o pain when doing 9 hole peg test.  Feel performance on 9 hole peg test largely due to sensory impairment vs true incoordination but sensory impairment would impact functional coordination.      Tone   Assessment Location Right Upper Extremity;Left Upper Extremity     ROM / Strength   AROM / PROM / Strength AROM;Strength     AROM   Overall AROM  Within functional limits for tasks performed     Strength   Overall Strength Deficits   Overall Strength Comments UE's intact for strength except for grip strength - see below.  Feel dynamometer is not necessarily accurately measuring strength as pt reports she is doing activities that would require more strength than measured - feel sensation is impacting performance on dynamometer.      Hand Function   Right Hand Gross Grasp Impaired   Right Hand Grip (lbs) 15   Left Hand Gross Grasp Impaired   Left Hand Grip (lbs) 20   Comment Pt needed max encouragement for full effort;  test may be impacted by sensation as pt is able to complete several tasks at home that would indicated strength is better than measured.      RUE Tone   RUE Tone Within Functional Limits     LUE Tone   LUE Tone Within Functional Limits                              OT Long Term Goals - 07/03/16 0940      OT LONG TERM GOAL #1   Title Pt will be mod I with HEP for bilateral coordination, grip strength - 07/31/2016   Status New     OT LONG TERM GOAL #2   Title Pt will improve grip strength for RUE by at least 8 pounds, L grip by at least 5 pounds  to assist with functional activities (R= 15, L = 20).    Status New     OT LONG TERM GOAL #3   Title Pt will demonstrate ability to use vision to compensate for sensory impairment when using hands during functional tasks.    Status New     OT LONG TERM GOAL #4   Title Pt will verbalize understanding of AE to ease buttoning, tying shoes, opening containers, etc for ADL and IADL activities.    Status New     OT LONG TERM GOAL #5   Title Pt will demonstrate improved coordination as evidenced by decreasing time on 9 hole peg test in total by at least 6 seconds to assist with functional tasks (R= 30.83, L= 33.41)   Status New     Long Term Additional Goals   Additional Long Term Goals Yes     OT LONG TERM GOAL #6   Title Pt will vebalize understanding of desensitization strategies for B hands.    Status New     OT LONG TERM GOAL #7   Title Pt will demonstate understanding of MS literature provided for website and local MS chapter.    Status New     OT LONG TERM GOAL #8   Title Pt will be mod I with wear and care of splints PRN (pt reports that her hands feels like they curl up if she doesn't keep moving them - worse at night- may splint)   Status New  Plan - 07/03/16 0946    Clinical Impression Statement Pt is a 56 year old female s/p new diagnosis of MS - pt was admitted to the hospital on 5.16/2018 and discharged home on 06/28/3016 after inpt rehab stay.  Pt presents today with the following impairments that impact her ability to complete ADL, IADL , leisure and care of granddaugher:  impaired sensation, neuropathic pain in hands  and feet, impaired coordination of B hands, impaired grip strength B hands, impaired functional use of B hands decreased activity tolerance.  Pt will benefit from skilled OT to adress these deficits to maximize functional use of B hands as well as maximize indepdendence.    Occupational Profile and client history currently impacting  functional performance anemia, GERD,    Occupational performance deficits (Please refer to evaluation for details): ADL's;IADL's;Rest and Sleep;Work;Leisure   Rehab Potential Good   OT Frequency 2x / week   OT Duration 4 weeks   OT Treatment/Interventions Self-care/ADL training;Moist Heat;Contrast Bath;Fluidtherapy;Parrafin;Ultrasound;Therapeutic exercise;Neuromuscular education;Energy conservation;DME and/or AE instruction;Splinting;Therapeutic activities;Patient/family education   Clinical Decision Making Limited treatment options, no task modification necessary      Patient will benefit from skilled therapeutic intervention in order to improve the following deficits and impairments:  Decreased activity tolerance, Decreased coordination, Decreased knowledge of use of DME, Decreased strength, Impaired UE functional use, Impaired sensation, Pain  Visit Diagnosis: Other lack of coordination - Plan: Ot plan of care cert/re-cert  Other disturbances of skin sensation - Plan: Ot plan of care cert/re-cert  Muscle weakness (generalized) - Plan: Ot plan of care cert/re-cert    Problem List Patient Active Problem List   Diagnosis Date Noted  . Multiple sclerosis ([place]) 06/19/2016  . Neuropathic pain   . Neurologic gait disorder   . Neurogenic bowel   . Neurogenic bladder   . Anemia of chronic disease   . Leukocytosis   . Steroid-induced hyperglycemia   . Acute transverse myelitis in demyelinating disease of central nervous system ([place]) 06/14/2016  . Allergic rhinitis 04/12/2016  . Medication overuse headache 03/17/2016  . Elevated blood pressure reading in office without diagnosis of hypertension 03/13/2016  . Otitis media 02/23/2014  . Sciatica associated with disorder of lumbosacral spine 01/15/2014  . Edema 01/15/2014  . Headache(784.0) 07/21/2013  . GERD (gastroesophageal reflux disease) 07/21/2013  . RBBB on EKG 08/01/2012  . IBS (irritable bowel syndrome) 06/19/2012  .  Healthcare maintenance 06/19/2012    Quay Burow, OTR/L 07/03/2016, 9:57 AM  [facility] [address] [place], [place], [address] Phone: [phone]   Fax:  [phone]  Name: Ardra Kuznicki MRN: [id number] Date of Birth: 12-13-1960

## 2016-07-03 NOTE — Telephone Encounter (Signed)
Thanks.   If symptoms are worsening she would need appointment in [facility]. If they worsen over night she may need to go to the ER. We are familiar with her new diagnosis of MS but it would be difficult to direct management without having her come in to the office. Also. The headaches are new. They could be from several reasons. The "dragging left leg is concerning". If she is willing she should make in appointment in [facility] clinic.  Keep me updated.  Thanks, Jeneen Rinks

## 2016-07-03 NOTE — Telephone Encounter (Signed)
Pt calling to give a report from Her Neuro Office.  Please call patient back.

## 2016-07-03 NOTE — Telephone Encounter (Signed)
Pt informed of Dr Tanna Furry response. Offered an appt for today @ 1515 PM in [facility] - stated will not be able to come today b/c husband is not at home. Appt scheduled for tomorrow in [facility] @ 0915 AM. Informed pt if symptoms worsen over night to go to the ED' voiced understanding.

## 2016-07-03 NOTE — Telephone Encounter (Signed)
Mrs Soileau called and has been having pain in her back and constant headaches that are in the top of her head and back of her head.  She said the pain in her back is much like it was with the onset of her illness. She also is experiencing left foot dragging off and on.  She discussed with PT/OT this am and they encouraged her to call and report.  She does not have an appt to see Dr Letta Pate until 07/25/16 and she has not seen Dr Felecia Shelling yet--will see him 07/10/16.  Please advise.
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
## 2016-07-03 NOTE — Telephone Encounter (Signed)
Returned pt's call - stated went for outpt treatment at [facility] for PT/OT eval. Asked about new pain - she told them pain in her back (same site spinal tap was done) started last night w/left leg dragging intermittently. Also having h/a's - top and back of head. She was told to call her PCP since  Neuro appt not until June 11th. Pt concern inflammation or infection was not completed resolved or has returned.Stated Dr Lovena Le is familiar w/her case. Please advise. Thanks

## 2016-07-03 NOTE — Telephone Encounter (Signed)
Reviewed the chart, Dr. Wynetta Emery is addressing these issues

## 2016-07-04 ENCOUNTER — Ambulatory Visit (INDEPENDENT_AMBULATORY_CARE_PROVIDER_SITE_OTHER): Payer: BLUE CROSS/BLUE SHIELD | Admitting: Internal Medicine

## 2016-07-04 ENCOUNTER — Ambulatory Visit (INDEPENDENT_AMBULATORY_CARE_PROVIDER_SITE_OTHER): Payer: BLUE CROSS/BLUE SHIELD | Admitting: Neurology

## 2016-07-04 ENCOUNTER — Encounter: Payer: Self-pay | Admitting: Internal Medicine

## 2016-07-04 ENCOUNTER — Encounter: Payer: Self-pay | Admitting: Neurology

## 2016-07-04 VITALS — BP 138/86 | HR 79 | Resp 18 | Ht 63.5 in | Wt 221.0 lb

## 2016-07-04 VITALS — BP 118/57 | HR 70 | Temp 98.4°F | Ht 63.0 in | Wt 223.1 lb

## 2016-07-04 DIAGNOSIS — G35 Multiple sclerosis: Secondary | ICD-10-CM | POA: Diagnosis not present

## 2016-07-04 DIAGNOSIS — Z79899 Other long term (current) drug therapy: Secondary | ICD-10-CM | POA: Insufficient documentation

## 2016-07-04 DIAGNOSIS — G373 Acute transverse myelitis in demyelinating disease of central nervous system: Secondary | ICD-10-CM

## 2016-07-04 DIAGNOSIS — N319 Neuromuscular dysfunction of bladder, unspecified: Secondary | ICD-10-CM | POA: Diagnosis not present

## 2016-07-04 DIAGNOSIS — R269 Unspecified abnormalities of gait and mobility: Secondary | ICD-10-CM

## 2016-07-04 MED ORDER — PHENTERMINE HCL 30 MG PO CAPS: 30.0000 mg | ORAL_CAPSULE | ORAL | 5 refills | Status: DC

## 2016-07-04 NOTE — Progress Notes (Signed)
[facility]  PATIENT: Tamara [name] DOB: Jun 21, 1960 (age 56)  REFERRING DOCTOR OR PCP:  Nadean Corwin SOURCE: Patient, notes from emergency room and recent hospital stay ([facility]), imaging and lab reports, MRI images on PACS.  _________________________________   HISTORICAL  CHIEF COMPLAINT:  Chief Complaint  Patient presents with  . Abnormal MRI    Jurie is here with her husband Brett Canales to discuss MRI results.  Sts. on 06/10/16, she had onset of lbp, numbness from the waist down, gait disturbance, left foot drop.  She was admitted to [facility] on 5/16.  Abnormal MRI brain.  LP was done. Had 3 days of IV SM,which she sts. helped, then d/c pt. with oral taper.  She has completed oral taper; sts. no relief of sx. Now having headaches, weakness/numbness in bilat hands./fim  . Numbness    HISTORY OF PRESENT ILLNESS:  I had the pleasure seeing you patient, Tamara [name], at the MS center at [facility] for neurologic consultation regarding her recent cervical myelopathy and possibility of multiple sclerosis.   On 06/10/16, she had the onset of numbness and clumsiness in her legs.   As the day went on, her hands also became numb and clumsy.  She felt numb from her waist down.    She went to the [facility] and had an xtay of her back and was referred to orthopedics     Two days later, she went to the ED when her symptoms worsened and she was seen and discharged.    She saw her internist who ordered a lumbar MRI.  The lumbar MRI showed degenerative changes that were mild at L4-L5 and the study was otherwise fairly normal.   She was referred to the ED and had an MRI of the brain, cervical spine and thoracic spine.    She was found to have a transverse myelitis in the cervical spine with an enhancing lesion in the posterior columns. There was also a smaller non-enhancing focus at C5   She also had 5-6 spots in her brain, the largest being periventricular on the right.   She was admitted to [facility] and had 3 days of IV Solumedrol followed by an oral taper.    She was transferred to rehabilitation and was discharged 5/30.   She walked out and felt much better.   Additionally, although she was in the hospital (06/15/2016) she had a lumbar puncture. The CSF was abnormal showing showing the presence all 4 oligoclonal bands and an elevated IgG index of 0.9 (less than 0.7 is normal). The myelin basic protein was mildly elevated at 2.2.   She was scheduled to see me next week.      Then, yesterday morning, she got out of bed and was experiencing lower back pain and noted that the left leg was dragging more.   She is stumbling and feels off balanced.     Also, she is noting more numbness and weakness in her hands and has a worsening headache.   She went to the ED and we were asked to see her today.    In retrospect, last year she had an episode lasting a few weeks where she felt she was dragging her left leg some. This completely resolved and she did not have any other symptoms such as numbness at that time.  Currently, her gait is wide and off balanced.   Her legs feel heavy.  Her hands also seem mildly weak and clumsy.  She is numb from her waist down and she has more severe numbness in her hands.     She also has had bladder issues.   She was unable to feel her groin and did not know when she went to the bathroom.   She had hesitancy.    These issues are better though some numbness remains.  She denied any major change in vision or diplopia.    She noted a floater last month that came off/on.   Colors are normal and symmetric.     She has noted more fatigue a little before her symptoms started in May.    She has also gained 18 pounds since early May.    She is sleeping worse.   She has trouble with sleep onset an sleep maintenance insomnia.    She wakes up several times after 3 am every night.      She denies any major depression but feels mood is mildly worse between  her new symptoms and her weight gain.   She denies any change in cognition.    I personally reviewed the MRIs of the brain, cervical spine, thoracic spine and lumbar spine. The MRI of the cervical spine shows an enhancing lesion posterior spinal cord adjacent to C C3. Additionally there is a nonenhancing focus adjacent to C5. In the brain, there are several T2/FLAIR hyperintense foci in the largest is in the right parietal lobe and there are 4 or 5 other small foci, one in the periventricular white matter of the right frontal lobe and the rest in the subcortical white matter.  Laboratory studies show 4 oligoclonal bands and an elevated IgG index of 0.9. Myelin basic protein was mildly elevated at 2.2. She had elevated glucose between 120 and 140 several times while getting steroids.   Her Vit D was mildly low (25.6) and she just started OTC supplementation.   REVIEW OF SYSTEMS: Constitutional: No fevers, chills, sweats, or change in appetite Eyes: No visual changes, double vision, eye pain Ear, nose and throat: No hearing loss, ear pain, nasal congestion, sore throat Cardiovascular: No chest pain, palpitations Respiratory: No shortness of breath at rest or with exertion.   No wheezes GastrointestinaI: No nausea, vomiting, diarrhea, abdominal pain, fecal incontinence Genitourinary: No dysuria, urinary retention or frequency.  No nocturia. Musculoskeletal: No neck pain, back pain Integumentary: No rash, pruritus, skin lesions Neurological: as above Psychiatric: No depression at this time.  No anxiety Endocrine: No palpitations, diaphoresis, change in appetite, change in weigh or increased thirst Hematologic/Lymphatic: No anemia, purpura, petechiae. Allergic/Immunologic: No itchy/runny eyes, nasal congestion, recent allergic reactions, rashes  ALLERGIES: Allergies  Allergen Reactions  . Codeine Other (See Comments)    Delusions  . Dilaudid [Hydromorphone Hcl] Swelling    Tongue swells     . Paxil [Paroxetine Hcl] Other (See Comments)    Hallucinations and heavy periods   . Tegretol [Carbamazepine] Itching  . Adhesive [Tape] Hives, Itching and Rash    PAPER TAPE=   . Augmentin [Amoxicillin-Pot Clavulanate] Rash    HOME MEDICATIONS:  Current Outpatient Prescriptions:  .  ACAI BERRY PO, Take 1 capsule by mouth daily as needed (for vitamin)., Disp: , Rfl:  .  acetaminophen (TYLENOL) 325 MG tablet, Take 2 tablets (650 mg total) by mouth every 6 (six) hours as needed for moderate pain., Disp: , Rfl:  .  Amino Acids-Protein Hydrolys (FEEDING SUPPLEMENT, PRO-STAT SUGAR FREE 64,) LIQD, Take 30 mLs by mouth 2 (two) times daily., Disp: 900  mL, Rfl: 0 .  b complex vitamins tablet, Take 1 tablet by mouth daily., Disp: , Rfl:  .  capsaicin (ZOSTRIX) 0.025 % cream, Apply topically 2 (two) times daily. Apply to affected areas, Disp: 60 g, Rfl: 0 .  cetirizine (ZYRTEC ALLERGY) 10 MG tablet, Take 1 tablet (10 mg total) by mouth daily. (Patient taking differently: Take 10 mg by mouth daily as needed for allergies. ), Disp: 30 tablet, Rfl: 2 .  cholecalciferol (VITAMIN D) 400 UNITS TABS tablet, Take 400 Units by mouth daily. , Disp: , Rfl:  .  diclofenac sodium (VOLTAREN) 1 % GEL, Apply 2 g topically 3 (three) times daily., Disp: 1 Tube, Rfl: 0 .  docusate sodium (COLACE) 100 MG capsule, Take 1 capsule (100 mg total) by mouth daily., Disp: 10 capsule, Rfl: 0 .  DULoxetine (CYMBALTA) 20 MG capsule, Take 1 capsule (20 mg total) by mouth daily., Disp: 30 capsule, Rfl: 3 .  fluticasone (FLONASE) 50 MCG/ACT nasal spray, Place 2 sprays into both nostrils daily. (Patient taking differently: Place 2 sprays into both nostrils daily as needed for allergies. ), Disp: 16 g, Rfl: 2 .  Multiple Vitamin (MULTIVITAMIN) tablet, Take 1 tablet by mouth daily., Disp: , Rfl:  .  Omega-3 Fatty Acids (FISH OIL) 1000 MG CAPS, Take 1,000-2,000 mg by mouth See admin instructions. Take 2 capsules in the morning and take  1 capsule at bedtime , Disp: , Rfl:  .  pantoprazole (PROTONIX) 40 MG tablet, Take 1 tablet (40 mg total) by mouth daily., Disp: 10 tablet, Rfl: 0 .  Polyethyl Glycol-Propyl Glycol (SYSTANE) 0.4-0.3 % SOLN, Place 1 drop into both eyes daily as needed (for dry eyes)., Disp: , Rfl:  .  traMADol (ULTRAM) 50 MG tablet, Take 1-2 tablets (50-100 mg total) by mouth every 6 (six) hours as needed for severe pain., Disp: 20 tablet, Rfl: 0 .  vitamin E 100 UNIT capsule, Take 100 Units by mouth daily., Disp: , Rfl:  .  predniSONE (DELTASONE) 5 MG tablet, 2 tablets 1 day then 1 tablet 1 day and stop (Patient not taking: Reported on 07/04/2016), Disp: 3 tablet, Rfl: 0  PAST MEDICAL HISTORY: Past Medical History:  Diagnosis Date  . Anemia   . Bowel obstruction (HCC)   . GERD (gastroesophageal reflux disease)   . IBS (irritable bowel syndrome)    at age of 68  . Sciatica 2009  . Vision abnormalities     PAST SURGICAL HISTORY: Past Surgical History:  Procedure Laterality Date  . ABDOMINAL HYSTERECTOMY  2008   cervix and right ovary still intact  . KNEE ARTHROSCOPY  2010 and 2011   Left knee, x2    FAMILY HISTORY: Family History  Problem Relation Age of Onset  . Cancer Father        Gallbladder  . Gallbladder disease Father   . Hypertension Brother   . Gallbladder disease Paternal Grandmother   . Colon cancer Neg Hx   . Colon polyps Neg Hx   . Esophageal cancer Neg Hx   . Kidney disease Neg Hx     SOCIAL HISTORY:  Social History   Social History  . Marital status: Married    Spouse name: N/A  . Number of children: 2  . Years of education: N/A   Occupational History  . Science writer    Social History Main Topics  . Smoking status: Never Smoker  . Smokeless tobacco: Never Used  . Alcohol use No  . Drug use:  No  . Sexual activity: Not on file   Other Topics Concern  . Not on file   Social History Narrative  . No narrative on file     PHYSICAL EXAM  Vitals:    07/04/16 1233  BP: 138/86  Pulse: 79  Resp: 18  Weight: 221 lb (100.2 kg)  Height: 5' 3.5" (1.613 m)    Body mass index is 38.53 kg/m.   General: The patient is well-developed and well-nourished and in no acute distress  Eyes:  Funduscopic exam shows normal optic discs and retinal vessels.   She has cataracts.     Neck: The neck is supple, no carotid bruits are noted.  The neck is nontender.  Cardiovascular: The heart has a regular rate and rhythm with a normal S1 and S2. There were no murmurs, gallops or rubs. Lungs are clear to auscultation.  Skin: Extremities are without significant edema.  Musculoskeletal:  Back is nontender  Neurologic Exam  Mental status: The patient is alert and oriented x 3 at the time of the examination. The patient has apparent normal recent and remote memory, with an apparently normal attention span and concentration ability.   Speech is normal.  Cranial nerves: Extraocular movements are full. Pupils are equal, round, and reactive to light and accomodation. Color vision is normal and symmetric.   Visual fields are full.  Facial symmetry is present. There is good facial sensation to soft touch bilaterally.Facial strength is normal.  Trapezius and sternocleidomastoid strength is normal. No dysarthria is noted.  The tongue is midline, and the patient has symmetric elevation of the soft palate. No obvious hearing deficits are noted.  Motor:  Muscle bulk is normal.   Tone is normal. Strength is  5 / 5 in all 4 extremities except 4+/5 intrinsic hand muscles   Sensory: Sensory testing shows reduced sensation to touch, temperature and vibration in her hands bilaterally, a little worse on the right. She also had mild reduced sensation to vibration in the toes..  Coordination: Cerebellar testing reveals good finger-nose-finger and minimally reduced heel-to-shin bilaterally.  Gait and station: Station is normal.   Her gait is mildly wide and off balance. She is  unable to do a tandem gait. Romberg sign is positive.    Reflexes: Deep tendon reflexes are symmetric and normal in the arms. In the legs, the deep tendon reflexes are increased and she has spread at the knees and 2 beats nonsustained clonus at the ankles. Plantar responses are flexor.       DIAGNOSTIC DATA (LABS, IMAGING, TESTING) - I reviewed patient records, labs, notes, testing and imaging myself where available.  Lab Results  Component Value Date   WBC 9.8 06/20/2016   HGB 11.3 (L) 06/20/2016   HCT 35.8 (L) 06/20/2016   MCV 87.5 06/20/2016   PLT 161 06/20/2016      Component Value Date/Time   NA 140 06/20/2016 0545   K 3.7 06/20/2016 0545   CL 104 06/20/2016 0545   CO2 30 06/20/2016 0545   GLUCOSE 85 06/20/2016 0545   BUN 15 06/20/2016 0545   CREATININE 1.08 (H) 06/26/2016 0609   CREATININE 1.01 07/08/2013 1440   CALCIUM 8.1 (L) 06/20/2016 0545   PROT 5.1 (L) 06/20/2016 0545   ALBUMIN 2.7 (L) 06/20/2016 0545   ALBUMIN 4.2 06/15/2016 1203   AST 14 (L) 06/20/2016 0545   ALT 20 06/20/2016 0545   ALKPHOS 47 06/20/2016 0545   BILITOT 0.4 06/20/2016 0545   GFRNONAA 57 (  L) 06/26/2016 0609   GFRNONAA 64 07/08/2013 1440   GFRAA >60 06/26/2016 0609   GFRAA 74 07/08/2013 1440   No results found for: CHOL, HDL, LDLCALC, LDLDIRECT, TRIG, CHOLHDL No results found for: ZOXW9U Lab Results  Component Value Date   VITAMINB12 2,209 (H) 06/15/2016   Lab Results  Component Value Date   TSH 0.351 06/15/2016       ASSESSMENT AND PLAN  Multiple sclerosis (HCC) - Plan: Hepatitis B core antibody, total, Hepatitis B surface antigen, Quantiferon tb gold assay (blood), Hepatitis B surface antibody, Stratify JCV Antibody Test ([facility])  Transverse myelitis (HCC)  High risk medication use - Plan: Hepatitis B core antibody, total, Hepatitis B surface antigen, Quantiferon tb gold assay (blood), Hepatitis B surface antibody, Stratify JCV Antibody Test ([facility])  Neurogenic  bladder  Neurologic gait disorder   In summary, this is quite is a 56 year old woman with an episode of transverse myelitis that occurred 06/10/2016 who also had an episode of left leg weakness last year and now appears to have had an additional exacerbation with more symptoms starting yesterday.. The MRI of the brain and spinal cord are consistent with multiple sclerosis. Her CSF is consistent with multiple sclerosis. MS mimics including vasculitis and Devic's disease have been ruled out.   I am very concerned that she has to known transverse myelitis episodes and appears to have had a third episode this week. Because of this, she appears to have a more aggressive than average multiple sclerosis. Therefore, I feel it is medically necessary that she be placed on a highly effective medication early on. I recommended to her that she go on either Tysabri or ocrelizumab and we will check blood work to determine if she is a candidate for one or both of these medications.  We discussed their efficacy data in that ocrelizumab was significantly more effective than one of the interferons. We also went over the risks including PML for Tysabri and reactivation of hepatitis B, reactivation of TB, infusion reaction and possible increased risk of breast cancer for ocrelizumab.     Because she appears to be having another exacerbation I will have her get 3 days of IV Solu-Medrol this week with the first dose this afternoon. She has a lot of fatigue and has gained weight. I will have her try phentermine to help these 2 symptoms. If the tingling does not improve, consider increasing the duloxetine dose or changing to a different medication.  She will return to see [place] for her first infusion and see me in an office visit in 2-3 months. However, she is advised to call sooner if she has any new or worsening neurologic symptoms.  Thank you for asking to see Dedre for a neurologic consultation. Please let me know if I can be of  further assistance with her or other patients in the future.   Mustaf Antonacci A. Epimenio Foot, MD, PhD, [place] [place]  07/04/2016, 12:41 PM Certified in Neurology, Clinical Neurophysiology, Sleep Medicine, Pain Medicine and Neuroimaging Dir., [facility] at [facility]  [facility] [address] [place], [place] [address] [phone]

## 2016-07-05 DIAGNOSIS — G35 Multiple sclerosis: Secondary | ICD-10-CM | POA: Diagnosis not present

## 2016-07-05 LAB — HEPATITIS B SURFACE ANTIGEN: Hepatitis B Surface Ag: NEGATIVE

## 2016-07-05 LAB — HEPATITIS B CORE ANTIBODY, TOTAL: HEP B C TOTAL AB: NEGATIVE

## 2016-07-05 LAB — HEPATITIS B SURFACE ANTIBODY,QUALITATIVE: Hep B Surface Ab, Qual: NONREACTIVE

## 2016-07-05 NOTE — Telephone Encounter (Signed)
Swa Dr Felecia Shelling yesterday.

## 2016-07-05 NOTE — Progress Notes (Signed)
   CC: Patient is here to discuss worsening symptoms of transverse myelitis.  HPI:  [name] is a 56 y.o. female with a past medical history of conditions listed below presenting to the clinic to discuss worsening symptoms of transverse myelitis. Please see problem based charting for the status of the patient's current and chronic medical conditions.   Past Medical History:  Diagnosis Date  . Anemia   . Bowel obstruction (Start)   . GERD (gastroesophageal reflux disease)   . IBS (irritable bowel syndrome)    at age of 62  . Sciatica 2009  . Vision abnormalities     Review of Systems: Pertinent positives mentioned in HPI. Remainder of all ROS negative.   Physical Exam:  Vitals:   07/04/16 1027  BP: (!) 118/57  Pulse: 70  Temp: 98.4 F (36.9 C)  TempSrc: Oral  SpO2: 100%  Weight: 223 lb 1.6 oz (101.2 kg)  Height: 5\' 3"  (1.6 m)   Physical Exam  Constitutional: She is oriented to person, place, and time. She appears well-developed and well-nourished. No distress.  HENT:  Head: Normocephalic and atraumatic.  Eyes: Right eye exhibits no discharge. Left eye exhibits no discharge.  Neck: Neck supple. No tracheal deviation present.  Cardiovascular: Normal rate, regular rhythm and intact distal pulses.   Pulmonary/Chest: Effort normal and breath sounds normal. No respiratory distress. She has no wheezes. She has no rales.  Abdominal: Soft. Bowel sounds are normal. She exhibits no distension. There is no tenderness.  Musculoskeletal: She exhibits no edema.  Point tenderness on palpation of the lumbar spine.  Neurological: She is alert and oriented to person, place, and time. No cranial nerve deficit.  Slow gait but no acute abnormality noted. Decreased sensation to light touch in the abdominal area and hands bilaterally. Grip strength 4 out of 5 bilaterally. Motor strength 4 out of 5 in the left lower extremity and 5 out of 5 in the right lower extremity. Sensation to light  touch intact in bilateral lower extremities.  Skin: Skin is warm and dry.    Assessment & Plan:   See Encounters Tab for problem based charting.  Patient discussed with Dr. Lynnae January

## 2016-07-05 NOTE — Progress Notes (Signed)
Internal Medicine Clinic Attending  Case discussed with Dr. Rathoreat the time of the visit. We reviewed the resident's history and exam and pertinent patient test results. I agree with the assessment, diagnosis, and plan of care documented in the resident's note.  

## 2016-07-05 NOTE — Assessment & Plan Note (Signed)
Assessment Patient was recently admitted to the hospital in May 2018 and diagnosed with acute transverse myelitis. Multiple sclerosis was also on the differential as she had demyelination of neurons in the brain and cervical cord. Patient was discharged on 06/19/2016 to [facility]. She was then discharged from the rehabilitation facility on 06/27/2016.  Patient is now presenting with worsening neurological symptoms. States her symptoms initially improved when she was in rehabilitation but have been worse since she left. States she was discharged with a prednisone taper from rehabilitation which she finished 3 days ago. Reports having an upcoming appointment with Dr. Felecia Shelling (neurology) next Monday. Patient was concerned about her worsening symptoms and decided to come into the clinic today for evaluation. During the hospitalization, patient reported having decreased sensation T10 down, numbness of her hands bilaterally, and decreased grip strength bilaterally. She was treated with a course of high-dose steroids and symptoms had improved. Patient states the sensation in her hands is declining again and she has started dropping objects. Reports having no sensation at the bottom of her feet and having a new L sided foot drop which makes it difficult for her to ambulate. States she has been urinating less for the past 2 days. Bladder scan done at this visit showing minimal (22 ml) urine. Denies having any saddle anesthesia. Does state that her abdomen feels tight. Reports having regular bowel movements and she has not had any episodes of fecal incontinence. Patient is concerned that she is having lower back pain for the past few days despite taking tramadol. Also reports having intermittent headaches located at "different spots" on her head for the past few days. Denies having any blurry vision. Does report feeling nauseous but denies any episodes of vomiting and has been able to tolerate by mouth  intake.  Plan -I was concerned about the patient's worsening symptoms and called [facility] to ask Dr. Felecia Shelling whether it would be appropriate to give the patient steroids again and whether I should order an MRI of her brain and entire spine. I got in touch with Dr. Garth Bigness nurse who discussed the case with him. She suggested sending the patient to their office immediately for further evaluation. I advised the patient to go to neurology office immediately and she agreed. Patient was accompanied by her husband.

## 2016-07-07 ENCOUNTER — Telehealth: Payer: Self-pay | Admitting: *Deleted

## 2016-07-07 DIAGNOSIS — G35 Multiple sclerosis: Secondary | ICD-10-CM | POA: Diagnosis not present

## 2016-07-07 LAB — QUANTIFERON IN TUBE
QUANTIFERON MITOGEN VALUE: 1.97 [IU]/mL
QUANTIFERON NIL VALUE: 0.06 [IU]/mL
QUANTIFERON TB AG VALUE: 0.04 [IU]/mL
QUANTIFERON TB GOLD: NEGATIVE

## 2016-07-07 LAB — QUANTIFERON TB GOLD ASSAY (BLOOD)

## 2016-07-07 MED ORDER — TRAMADOL HCL 50 MG PO TABS: 50.0000 mg | ORAL_TABLET | Freq: Two times a day (BID) | ORAL | 0 refills | Status: DC | PRN

## 2016-07-07 NOTE — Telephone Encounter (Signed)
Tramadol faxed to [facility] per pt's request/fim

## 2016-07-09 ENCOUNTER — Encounter (HOSPITAL_COMMUNITY): Payer: Self-pay

## 2016-07-09 ENCOUNTER — Emergency Department (HOSPITAL_COMMUNITY): Payer: BLUE CROSS/BLUE SHIELD

## 2016-07-09 ENCOUNTER — Other Ambulatory Visit: Payer: Self-pay

## 2016-07-09 ENCOUNTER — Emergency Department (HOSPITAL_COMMUNITY)
Admission: EM | Admit: 2016-07-09 | Discharge: 2016-07-10 | Disposition: A | Discharge status: Home / Self Care | Payer: BLUE CROSS/BLUE SHIELD | Attending: Emergency Medicine | Admitting: Emergency Medicine

## 2016-07-09 DIAGNOSIS — K2100 Gastro-esophageal reflux disease with esophagitis, without bleeding: Secondary | ICD-10-CM

## 2016-07-09 DIAGNOSIS — R072 Precordial pain: Secondary | ICD-10-CM | POA: Insufficient documentation

## 2016-07-09 DIAGNOSIS — Z79899 Other long term (current) drug therapy: Secondary | ICD-10-CM | POA: Diagnosis not present

## 2016-07-09 DIAGNOSIS — R079 Chest pain, unspecified: Secondary | ICD-10-CM | POA: Diagnosis not present

## 2016-07-09 HISTORY — DX: Multiple sclerosis: G35

## 2016-07-09 LAB — BASIC METABOLIC PANEL
Anion gap: 7 (ref 5–15)
BUN: 19 mg/dL (ref 6–20)
CALCIUM: 8.2 mg/dL — AB (ref 8.9–10.3)
CO2: 27 mmol/L (ref 22–32)
CREATININE: 1.13 mg/dL — AB (ref 0.44–1.00)
Chloride: 104 mmol/L (ref 101–111)
GFR calc non Af Amer: 54 mL/min — ABNORMAL LOW (ref >60)
Glucose, Bld: 91 mg/dL (ref 65–99)
Potassium: 3.6 mmol/L (ref 3.5–5.1)
Sodium: 138 mmol/L (ref 135–145)

## 2016-07-09 LAB — I-STAT TROPONIN, ED: TROPONIN I, POC: 0 ng/mL (ref 0.00–0.08)

## 2016-07-09 LAB — CBC
HCT: 32 % — ABNORMAL LOW (ref 36.0–46.0)
Hemoglobin: 10.2 g/dL — ABNORMAL LOW (ref 12.0–15.0)
MCH: 28.3 pg (ref 26.0–34.0)
MCHC: 31.9 g/dL (ref 30.0–36.0)
MCV: 88.9 fL (ref 78.0–100.0)
PLATELETS: 190 10*3/uL (ref 150–400)
RBC: 3.6 MIL/uL — AB (ref 3.87–5.11)
RDW: 15.3 % (ref 11.5–15.5)
WBC: 7.3 10*3/uL (ref 4.0–10.5)

## 2016-07-09 MED ORDER — GI COCKTAIL ~~LOC~~
30.0000 mL | Freq: Once | ORAL | Status: AC
  Administered 2016-07-10: 30 mL via ORAL
  Filled 2016-07-09: qty 30

## 2016-07-09 MED ORDER — ACETAMINOPHEN 325 MG PO TABS
650.0000 mg | ORAL_TABLET | Freq: Once | ORAL | Status: AC
  Administered 2016-07-10: 650 mg via ORAL
  Filled 2016-07-09: qty 2

## 2016-07-09 MED ORDER — ASPIRIN 81 MG PO CHEW: 324.0000 mg | CHEWABLE_TABLET | Freq: Once | ORAL | Status: DC

## 2016-07-09 NOTE — ED Triage Notes (Addendum)
To room via EMS.  Onset 1.5 hrs PTA mid chest pain, pt was sitting in chair at onset, diaphoresis, nausea.  Chest worse when pressed on.  DX w/ MS last week (numbness, tingling in hands and from chest down to feet.  EMS gave ASA 324 mg,. Ntg x 1- made pain worse, no more given, BP remained same.

## 2016-07-09 NOTE — ED Notes (Signed)
Phlebotomy at bedside.

## 2016-07-09 NOTE — ED Provider Notes (Signed)
[facility] DEPT Provider Note   CSN: [id number] Arrival date & time: 07/09/16  2039     History   Chief Complaint Chief Complaint  Patient presents with  . Chest Pain    HPI Randall Rampersad is a 56 y.o. female.  Aylissa Heinemann is a 56 y.o. Female with a history of MS who presents to the emergency department complaining of chest pain and facial numbness today. Patient reports he was sitting in her chair about 2 hours prior to arrival when she had onset of substernal nonradiating chest pain. She reports the pain was a pressure and it felt like "heartburn." She reports taking Pepcid and an H2 blocker and her pain has been cut in half. She also reports she had left-sided facial numbness and tingling that has since resolved. She reports currently reports her chest pain as a 5 out of 10. She reports some associated mild shortness of breath.  She reports associated headche. She was recently diagnosed with MS and is followed by neurology. She denies history MI, PE or DVT. She denies fevers, cough, hemoptysis, burping, weakness, abdominal pain, vomiting, diarrhea, or rashes.    The history is provided by the patient and medical records. No language interpreter was used.  Chest Pain   Associated symptoms include headaches, numbness and shortness of breath. Pertinent negatives include no abdominal pain, no back pain, no cough, no dizziness, no fever, no nausea, no palpitations, no vomiting and no weakness.    Past Medical History:  Diagnosis Date  . Anemia   . Bowel obstruction ([place])   . GERD (gastroesophageal reflux disease)   . IBS (irritable bowel syndrome)    at age of 34  . Multiple sclerosis ([place])   . Sciatica 2009  . Vision abnormalities     Patient Active Problem List   Diagnosis Date Noted  . High risk medication use 07/04/2016  . Multiple sclerosis ([place]) 06/19/2016  . Neuropathic pain   . Neurologic gait disorder   . Neurogenic bowel   . Neurogenic bladder   . Anemia of  chronic disease   . Leukocytosis   . Steroid-induced hyperglycemia   . Transverse myelitis ([place]) 06/14/2016  . Allergic rhinitis 04/12/2016  . Medication overuse headache 03/17/2016  . Elevated blood pressure reading in office without diagnosis of hypertension 03/13/2016  . Otitis media 02/23/2014  . Sciatica associated with disorder of lumbosacral spine 01/15/2014  . Edema 01/15/2014  . Headache(784.0) 07/21/2013  . GERD (gastroesophageal reflux disease) 07/21/2013  . RBBB on EKG 08/01/2012  . IBS (irritable bowel syndrome) 06/19/2012  . Healthcare maintenance 06/19/2012    Past Surgical History:  Procedure Laterality Date  . ABDOMINAL HYSTERECTOMY  2008   cervix and right ovary still intact  . KNEE ARTHROSCOPY  2010 and 2011   Left knee, x2    OB History    Gravida Para Term Preterm AB Living   2 2 2  0 0 2   SAB TAB Ectopic Multiple Live Births   0 0 0 0         Home Medications    Prior to Admission medications   Medication Sig Start Date End Date Taking? Authorizing Provider  acetaminophen (TYLENOL) 325 MG tablet Take 2 tablets (650 mg total) by mouth every 6 (six) hours as needed for moderate pain. 06/28/16  Yes Angiulli, Lavon Paganini, PA-C  Amino Acids-Protein Hydrolys (FEEDING SUPPLEMENT, PRO-STAT SUGAR FREE 64,) LIQD Take 30 mLs by mouth 2 (two) times daily. 06/28/16  Yes [place],  Lavon Paganini, PA-C  b complex vitamins tablet Take 1 tablet by mouth daily. 06/19/12  Yes Neta Ehlers, MD  cetirizine (ZYRTEC ALLERGY) 10 MG tablet Take 1 tablet (10 mg total) by mouth daily. Patient taking differently: Take 10 mg by mouth daily as needed for allergies.  04/12/16  Yes Ahmed, Chesley Mires, MD  cholecalciferol (VITAMIN D) 400 UNITS TABS tablet Take 400 Units by mouth daily.    Yes [provider]  diclofenac sodium (VOLTAREN) 1 % GEL Apply 2 g topically 3 (three) times daily. 06/28/16  Yes Angiulli, Lavon Paganini, PA-C  docusate sodium (COLACE) 100 MG capsule Take 1 capsule (100 mg  total) by mouth daily. 06/28/16  Yes Angiulli, Lavon Paganini, PA-C  fluticasone (FLONASE) 50 MCG/ACT nasal spray Place 2 sprays into both nostrils daily. Patient taking differently: Place 2 sprays into both nostrils daily as needed for allergies.  03/13/16  Yes Burns, Arloa Koh, MD  Multiple Vitamin (MULTIVITAMIN) tablet Take 1 tablet by mouth daily. 06/19/12  Yes Neta Ehlers, MD  Multiple Vitamins-Minerals (HAIR SKIN AND NAILS FORMULA) TABS Take 1 tablet by mouth daily.   Yes [provider]  Omega-3 Fatty Acids (FISH OIL) 1000 MG CAPS Take 1,000-2,000 mg by mouth See admin instructions. Take 2 capsules in the morning and take 1 capsule at bedtime    Yes [provider]  pantoprazole (PROTONIX) 40 MG tablet Take 1 tablet (40 mg total) by mouth daily. 06/28/16  Yes Angiulli, Lavon Paganini, PA-C  phentermine 30 MG capsule Take 1 capsule (30 mg total) by mouth every morning. 07/04/16  Yes Sater, Nanine Means, MD  Polyethyl Glycol-Propyl Glycol (SYSTANE) 0.4-0.3 % SOLN Place 1 drop into both eyes daily as needed (for dry eyes).   Yes [provider]  traMADol (ULTRAM) 50 MG tablet Take 1 tablet (50 mg total) by mouth 2 (two) times daily as needed for severe pain. 07/07/16  Yes Sater, Nanine Means, MD  vitamin E 100 UNIT capsule Take 100 Units by mouth daily.   Yes [provider]  capsaicin (ZOSTRIX) 0.025 % cream Apply topically 2 (two) times daily. Apply to affected areas Patient not taking: Reported on 07/09/2016 06/28/16   Angiulli, Lavon Paganini, PA-C  DULoxetine (CYMBALTA) 20 MG capsule Take 1 capsule (20 mg total) by mouth daily. Patient not taking: Reported on 07/09/2016 06/28/16   Angiulli, Lavon Paganini, PA-C  omeprazole (PRILOSEC) 20 MG capsule Take 1 capsule (20 mg total) by mouth daily. 07/10/16   Waynetta Pean, PA-C  predniSONE (DELTASONE) 5 MG tablet 2 tablets 1 day then 1 tablet 1 day and stop Patient not taking: Reported on 07/04/2016 06/28/16   Angiulli, Lavon Paganini, PA-C    Family  History Family History  Problem Relation Age of Onset  . Cancer Father        Gallbladder  . Gallbladder disease Father   . Hypertension Brother   . Gallbladder disease Paternal Grandmother   . Colon cancer Neg Hx   . Colon polyps Neg Hx   . Esophageal cancer Neg Hx   . Kidney disease Neg Hx     Social History Social History  Substance Use Topics  . Smoking status: Never Smoker  . Smokeless tobacco: Never Used  . Alcohol use No     Allergies   Codeine; Dilaudid [hydromorphone hcl]; Paxil [paroxetine hcl]; Tegretol [carbamazepine]; Adhesive [tape]; and Augmentin [amoxicillin-pot clavulanate]   Review of Systems Review of Systems  Constitutional: Negative for chills and fever.  HENT:  Negative for congestion and sore throat.   Eyes: Negative for pain and visual disturbance.  Respiratory: Positive for shortness of breath. Negative for cough and wheezing.   Cardiovascular: Positive for chest pain. Negative for palpitations.  Gastrointestinal: Negative for abdominal pain, diarrhea, nausea and vomiting.  Genitourinary: Negative for dysuria and frequency.  Musculoskeletal: Negative for back pain and neck pain.  Skin: Negative for rash.  Neurological: Positive for numbness and headaches. Negative for dizziness, syncope, weakness and light-headedness.     Physical Exam Updated Vital Signs BP 132/88   Pulse (!) 57   Temp 98.1 F (36.7 C) (Oral)   Resp 17   Ht 5' 3.5" (1.613 m)   Wt 100.7 kg (222 lb)   SpO2 99%   BMI 38.71 kg/m   Physical Exam  Constitutional: She is oriented to person, place, and time. She appears well-developed and well-nourished. No distress.  Nontoxic appearing.  HENT:  Head: Normocephalic and atraumatic.  Right Ear: External ear normal.  Left Ear: External ear normal.  Mouth/Throat: Oropharynx is clear and moist.  Eyes: Conjunctivae and EOM are normal. Pupils are equal, round, and reactive to light. Right eye exhibits no discharge. Left eye  exhibits no discharge.  Neck: Normal range of motion. Neck supple. No JVD present.  Cardiovascular: Normal rate, regular rhythm, normal heart sounds and intact distal pulses.  Exam reveals no gallop and no friction rub.   No murmur heard. Bilateral radial, posterior tibialis and dorsalis pedis pulses are intact.    Pulmonary/Chest: Effort normal and breath sounds normal. No stridor. No respiratory distress. She has no wheezes. She has no rales. She exhibits tenderness.  Lungs are clear to ascultation bilaterally. Symmetric chest expansion bilaterally. No increased work of breathing. No rales or rhonchi.   Substernal chest wall is TTP and reproduces her pain.   Abdominal: Soft. There is no tenderness. There is no guarding.  Musculoskeletal: Normal range of motion. She exhibits no edema or tenderness.  Lymphadenopathy:    She has no cervical adenopathy.  Neurological: She is alert and oriented to person, place, and time. No cranial nerve deficit. She exhibits normal muscle tone. Coordination normal.  Alert and oriented x 3.  CN are intact. Speech is clear and coherent. No pronator drift. Finger to nose intact. Heel-to-shin intact bilaterally. Good sphincter bilateral upper and lower extremities. Sensation is intact to her face bilaterally.  Skin: Skin is warm and dry. Capillary refill takes less than 2 seconds. No rash noted. She is not diaphoretic. No erythema. No pallor.  Psychiatric: She has a normal mood and affect. Her behavior is normal.  Nursing note and vitals reviewed.    ED Treatments / Results  Labs (all labs ordered are listed, but only abnormal results are displayed) Labs Reviewed  BASIC METABOLIC PANEL - Abnormal; Notable for the following:       Result Value   Creatinine, Ser 1.13 (*)    Calcium 8.2 (*)    GFR calc non Af Amer 54 (*)    All other components within normal limits  CBC - Abnormal; Notable for the following:    RBC 3.60 (*)    Hemoglobin 10.2 (*)    HCT 32.0  (*)    All other components within normal limits  Randolm Idol, ED  Randolm Idol, ED    EKG  EKG Interpretation  Date/Time:  Sunday July 09 2016 20:43:28 EDT Ventricular Rate:  62 PR Interval:    QRS Duration: 127 QT Interval:  416 QTC Calculation: 423 R Axis:   92 Text Interpretation:  Sinus rhythm Short PR interval RBBB and LPFB Confirmed by Hazle Coca [phone]) on 07/09/2016 9:02:06 PM       Radiology Dg Chest 2 View  Result Date: 07/09/2016 CLINICAL DATA:  Initial evaluation for acute chest pain. EXAM: CHEST  2 VIEW COMPARISON:  Prior radiograph from 06/10/2016. FINDINGS: Cardiac and mediastinal silhouettes are stable in size and contour, and remain within normal limits. Lungs hypoinflated. Secondary mild bronchovascular crowding. No focal infiltrates. No pulmonary edema or pleural effusion. No pneumothorax. No acute osseus abnormality para IMPRESSION: Shallow lung inflation with secondary mild bronchovascular crowding. No other active cardiopulmonary disease. Electronically Signed   By: Jeannine Boga M.D.   On: 07/09/2016 22:36    Procedures Procedures (including critical care time)  Medications Ordered in ED Medications  acetaminophen (TYLENOL) tablet 650 mg (650 mg Oral Given 07/10/16 0022)  gi cocktail (Maalox,Lidocaine,Donnatal) (30 mLs Oral Given 07/10/16 0022)     Initial Impression / Assessment and Plan / ED Course  I have reviewed the triage vital signs and the nursing notes.  Pertinent labs & imaging results that were available during my care of the patient were reviewed by me and considered in my medical decision making (see chart for details).    This is a 56 y.o. Female with a history of MS who presents to the emergency department complaining of chest pain and facial numbness today. Patient reports he was sitting in her chair about 2 hours prior to arrival when she had onset of substernal nonradiating chest pain. She reports the pain was a pressure  and it felt like "heartburn." She reports taking Pepcid and an H2 blocker and her pain has been cut in half. She also reports she had left-sided facial numbness and tingling that has since resolved. She reports currently reports her chest pain as a 5 out of 10. She reports some associated mild shortness of breath.  She reports associated headche. She was recently diagnosed with MS and is followed by neurology. On exam the patient is afebrile nontoxic appearing. Her lungs are clear to auscultation bilaterally. She has no focal neurological deficits on my exam. Her face has sensation intact bilaterally. Chest wall is tender to palpation and reproduces her pain. She has no tachypnea, tachycardia or hypoxia. Well's criteria negative. EKG shows no evidence of STEMI. It does show a right bundle branch block. Initial troponin is not elevated. BMP reveals a mild leak elevated creatinine of 1.13. This appears to be increasing slightly over the past several weeks. We'll have her follow up with primary care This rechecked. Encourage good oral hydration. CBC is unremarkable. Chest x-ray shows shallow lung inflation with secondary mild bronchovascular crowding. No other active cardiopulmonary disease. Repeat troponin was obtained and this is also not elevated. I have low suspicion for ACS, or PE in this patient. At reevaluation after GI cocktail and Tylenol patient reports feeling better. We'll discharge and have her follow-up with primary care to have her kidney function rechecked. Also provided her with information for cardiology for possible stress test. Return precautions discussed. I advised the patient to follow-up with their primary care provider this week. I advised the patient to return to the emergency department with new or worsening symptoms or new concerns. The patient verbalized understanding and agreement with plan.     This patient was discussed with Dr. Ralene Bathe who agrees with assessment and plan.  Final  Clinical Impressions(s) / ED Diagnoses  Final diagnoses:  Precordial pain  Gastroesophageal reflux disease with esophagitis    New Prescriptions New Prescriptions   OMEPRAZOLE (PRILOSEC) 20 MG CAPSULE    Take 1 capsule (20 mg total) by mouth daily.     Waynetta Pean, PA-C 07/10/16 0058    Quintella Reichert, MD 07/13/16 [phone]

## 2016-07-09 NOTE — ED Triage Notes (Signed)
Pt reports she took Pepcid, Tramadol 50 mg (has had 3 tabs today/

## 2016-07-09 NOTE — ED Notes (Signed)
Nurse currently drawing labs 

## 2016-07-10 ENCOUNTER — Ambulatory Visit: Payer: BLUE CROSS/BLUE SHIELD | Admitting: Physical Therapy

## 2016-07-10 ENCOUNTER — Ambulatory Visit: Payer: BLUE CROSS/BLUE SHIELD | Admitting: Neurology

## 2016-07-10 ENCOUNTER — Telehealth: Payer: Self-pay | Admitting: Internal Medicine

## 2016-07-10 ENCOUNTER — Telehealth: Payer: Self-pay | Admitting: Neurology

## 2016-07-10 ENCOUNTER — Ambulatory Visit: Payer: BLUE CROSS/BLUE SHIELD | Admitting: Occupational Therapy

## 2016-07-10 LAB — I-STAT TROPONIN, ED: TROPONIN I, POC: 0 ng/mL (ref 0.00–0.08)

## 2016-07-10 MED ORDER — OMEPRAZOLE 20 MG PO CPDR: 20.0000 mg | DELAYED_RELEASE_CAPSULE | Freq: Every day | ORAL | 0 refills | Status: DC

## 2016-07-10 NOTE — Telephone Encounter (Signed)
Patient called to advise Dr. Felecia Shelling she was at [facility] ED last night for chest pain.  Patient has been instructed to see a Cardiologist and PCP due to kidney functions.  Patient advised symptoms can be from infusions.

## 2016-07-10 NOTE — Telephone Encounter (Signed)
   Reason for call:   I received a call from [name] at 3:36 PM indicating that she was seen in the ED for chest pain and requesting a follow up appointment in [facility].   Pertinent Data:   Patient seen in ED on 6/10 for chest pain. There was low suspicion for ACS and PE and she improved with a GI cocktail and Tylenol. She was noted to have a slightly elevated serum creatinine of 1.13 and encouraged to maintain adequate oral hydration.  EKG showed a RBBB which is consistent with prior EKGs.   Assessment / Plan / Recommendations:   Will send message to our clinic staff to schedule patient for f/u after ED visit to assess renal function and RBBB.  As always, pt is advised that if symptoms worsen or new symptoms arise, they should go to an urgent care facility or to to ER for further evaluation.   Tamara Finders, MD   07/10/2016, 3:45 PM

## 2016-07-10 NOTE — Telephone Encounter (Signed)
I have spoken with Aasia this afternoon and advised that I have advised RAS of her trip to the ER yesterday, and referral to cardiology for chest pain.  Pt. .sts. she continues to have some lbp.  She denies uti sx.  Has completed IV steroids.  I have advised she continue Tramadol as rx'd for pain, that steroids continue to work for [employer] after infusion.  Otila Kluver and Mindy are working to get Tysabri approved for her and will hopefully be able to sched. her in the next couple of weeks.  She verbalized understanding of same/fim

## 2016-07-11 ENCOUNTER — Ambulatory Visit: Payer: BLUE CROSS/BLUE SHIELD | Admitting: Occupational Therapy

## 2016-07-11 ENCOUNTER — Encounter: Payer: Self-pay | Admitting: Occupational Therapy

## 2016-07-11 DIAGNOSIS — R208 Other disturbances of skin sensation: Secondary | ICD-10-CM | POA: Diagnosis not present

## 2016-07-11 DIAGNOSIS — R278 Other lack of coordination: Secondary | ICD-10-CM | POA: Diagnosis not present

## 2016-07-11 DIAGNOSIS — R2681 Unsteadiness on feet: Secondary | ICD-10-CM | POA: Diagnosis not present

## 2016-07-11 DIAGNOSIS — M6281 Muscle weakness (generalized): Secondary | ICD-10-CM

## 2016-07-11 DIAGNOSIS — R2689 Other abnormalities of gait and mobility: Secondary | ICD-10-CM | POA: Diagnosis not present

## 2016-07-11 DIAGNOSIS — M545 Low back pain: Secondary | ICD-10-CM | POA: Diagnosis not present

## 2016-07-11 NOTE — Patient Instructions (Signed)
  Coordination Activities  Perform the following activities for 15-20  minutes 1-2 times per day with both hand(s).   Rotate ball in fingertips (clockwise and counter-clockwise).  Toss ball between hands.  Toss ball in air and catch with the same hand.  Flip cards 1 at a time as fast as you can.  Pick up coins and place in container or coin bank.  Pick up coins and stack.  Pick up coins one at a time until you get 5-10 in your hand, then move coins from palm to fingertips to stack one at a time.  Practice writing and/or typing.  Screw together nuts and bolts, then unfasten.

## 2016-07-11 NOTE — Therapy (Signed)
[facility] [address] [place], [place], [address] Phone: [phone]   Fax:  [phone]  Occupational Therapy Treatment  Patient Details  Name: Tamara [name] MRN: [id number] Date of Birth: April 25, 1960 Referring Provider: Dr. Letta Pate  Encounter Date: 07/11/2016      OT End of Session - 07/11/16 1627    Visit Number 2   Number of Visits 8   Date for OT Re-Evaluation 07/31/16   Authorization Type [employer] - 30 visit limit for OT   OT Start Time 1540   OT Stop Time 1620   OT Time Calculation (min) 40 min   Activity Tolerance Patient tolerated treatment well      Past Medical History:  Diagnosis Date  . Anemia   . Bowel obstruction ([place])   . GERD (gastroesophageal reflux disease)   . IBS (irritable bowel syndrome)    at age of 89  . Multiple sclerosis ([place])   . Sciatica 2009  . Vision abnormalities     Past Surgical History:  Procedure Laterality Date  . ABDOMINAL HYSTERECTOMY  2008   cervix and right ovary still intact  . KNEE ARTHROSCOPY  2010 and 2011   Left knee, x2    There were no vitals filed for this visit.      Subjective Assessment - 07/11/16 1540    Subjective  I am getting the feeling back in my feet and stomach but not my hands.    Pertinent History see epic - newly diagnosed with MS   Patient Stated Goals to use my hands better   Currently in Pain? Yes   Pain Score 10-Worst pain ever  its like a 14 however pt is using hands to pick up items in the clinic   Pain Location Hand   Pain Orientation Right;Left   Pain Descriptors / Indicators Pins and needles;Burning;Numbness   Pain Type Acute pain   Pain Onset 1 to 4 weeks ago   Pain Frequency Constant   Aggravating Factors  picking up stainless steel, over exert myself   Pain Relieving Factors nothing   Effect of Pain on Daily Activities difficult to use my hands                      OT Treatments/Exercises (OP) -  07/11/16 0001      ADLs   ADL Comments Reviewed goals and POC with pt and pt in agreement.  Also provided several brochures of literature on MS as well as information on the National and Local chapters for the [place]. Pt stated "I am a reader so this is very helpful to me."       Fine Motor Coordination   Other Fine Motor Exercises Instructed pt on HEP for coordination for both hands. After instruction and practice pt able to return demonstrate all activities.  Pt with moderate difficulty with R hand and minimal difficulty with L hand. Began addressing training visual compensation for sensory impairment when using hands functionally                 OT Education - 07/11/16 1625    Education provided Yes   Education Details MS literature, MS society, HEP for coordination   Person(s) Educated Patient   Methods Explanation;Demonstration;Verbal cues;Handout   Comprehension Verbalized understanding;Returned demonstration             OT Long Term Goals - 07/11/16 1626      OT LONG TERM GOAL #1  Title Pt will be mod I with HEP for bilateral coordination, grip strength - 07/31/2016   Status On-going     OT LONG TERM GOAL #2   Title Pt will improve grip strength for RUE by at least 8 pounds, L grip by at least 5 pounds to assist with functional activities (R= 15, L = 20).    Status On-going     OT LONG TERM GOAL #3   Title Pt will demonstrate ability to use vision to compensate for sensory impairment when using hands during functional tasks.    Status On-going     OT LONG TERM GOAL #4   Title Pt will verbalize understanding of AE to ease buttoning, tying shoes, opening containers, etc for ADL and IADL activities.    Status On-going     OT LONG TERM GOAL #5   Title Pt will demonstrate improved coordination as evidenced by decreasing time on 9 hole peg test in total by at least 6 seconds to assist with functional tasks (R= 30.83, L= 33.41)   Status On-going     OT LONG  TERM GOAL #6   Title Pt will vebalize understanding of desensitization strategies for B hands.    Status On-going     OT LONG TERM GOAL #7   Title Pt will demonstate understanding of MS literature provided for website and local MS chapter.    Status On-going     OT LONG TERM GOAL #8   Title Pt will be mod I with wear and care of splints PRN (pt reports that her hands feels like they curl up if she doesn't keep moving them - worse at night- may splint)   Status On-going               Plan - 07/11/16 1626    Clinical Impression Statement Pt in agreement with all goals and POC.  Pt progressing toward goals at this time.    Rehab Potential Good   OT Frequency 2x / week   OT Duration 4 weeks   OT Treatment/Interventions Self-care/ADL training;Moist Heat;Contrast Bath;Fluidtherapy;Parrafin;Ultrasound;Therapeutic exercise;Neuromuscular education;Energy conservation;DME and/or AE instruction;Splinting;Therapeutic activities;Patient/family education   Plan check coordinaton HEP, provide desensitization protocol, grip strength HEP and functional activities with training for visual compensation when using hands.    Consulted and Agree with Plan of Care Patient      Patient will benefit from skilled therapeutic intervention in order to improve the following deficits and impairments:  Decreased activity tolerance, Decreased coordination, Decreased knowledge of use of DME, Decreased strength, Impaired UE functional use, Impaired sensation, Pain  Visit Diagnosis: Other lack of coordination  Other disturbances of skin sensation  Muscle weakness (generalized)    Problem List Patient Active Problem List   Diagnosis Date Noted  . High risk medication use 07/04/2016  . Multiple sclerosis ([place]) 06/19/2016  . Neuropathic pain   . Neurologic gait disorder   . Neurogenic bowel   . Neurogenic bladder   . Anemia of chronic disease   . Leukocytosis   . Steroid-induced hyperglycemia   .  Transverse myelitis ([place]) 06/14/2016  . Allergic rhinitis 04/12/2016  . Medication overuse headache 03/17/2016  . Elevated blood pressure reading in office without diagnosis of hypertension 03/13/2016  . Otitis media 02/23/2014  . Sciatica associated with disorder of lumbosacral spine 01/15/2014  . Edema 01/15/2014  . Headache(784.0) 07/21/2013  . GERD (gastroesophageal reflux disease) 07/21/2013  . RBBB on EKG 08/01/2012  . IBS (irritable bowel syndrome) 06/19/2012  .  Healthcare maintenance 06/19/2012    Quay Burow, OTR/L 07/11/2016, 4:29 PM  [facility] [address] [place], [place], [address] Phone: [phone]   Fax:  [phone]  Name: Tamara [name] MRN: [id number] Date of Birth: February 11, 1960

## 2016-07-12 ENCOUNTER — Ambulatory Visit: Payer: BLUE CROSS/BLUE SHIELD | Admitting: Occupational Therapy

## 2016-07-12 ENCOUNTER — Encounter: Payer: Self-pay | Admitting: Physical Therapy

## 2016-07-12 ENCOUNTER — Telehealth: Payer: Self-pay | Admitting: Neurology

## 2016-07-12 ENCOUNTER — Ambulatory Visit: Payer: BLUE CROSS/BLUE SHIELD | Admitting: Physical Therapy

## 2016-07-12 ENCOUNTER — Encounter: Payer: Self-pay | Admitting: Occupational Therapy

## 2016-07-12 DIAGNOSIS — R2689 Other abnormalities of gait and mobility: Secondary | ICD-10-CM

## 2016-07-12 DIAGNOSIS — R2681 Unsteadiness on feet: Secondary | ICD-10-CM | POA: Diagnosis not present

## 2016-07-12 DIAGNOSIS — M6281 Muscle weakness (generalized): Secondary | ICD-10-CM

## 2016-07-12 DIAGNOSIS — R278 Other lack of coordination: Secondary | ICD-10-CM

## 2016-07-12 DIAGNOSIS — M545 Low back pain: Secondary | ICD-10-CM | POA: Diagnosis not present

## 2016-07-12 DIAGNOSIS — R208 Other disturbances of skin sensation: Secondary | ICD-10-CM

## 2016-07-12 NOTE — Therapy (Signed)
[facility] [address] [place], [place], [address] Phone: [phone]   Fax:  [phone]  Physical Therapy Treatment  Patient Details  Name: Tamara [name] MRN: [id number] Date of Birth: July 16, 1960 Referring Provider: Alysia Penna, MD  Encounter Date: 07/12/2016      PT End of Session - 07/12/16 0955    Visit Number 2   Number of Visits 9   Date for PT Re-Evaluation 08/02/16   Authorization Type [employer]-30 visit limit for PT   Authorization - Visit Number 2   Authorization - Number of Visits 30   PT Start Time 0950  pt was 20 min late due to traffic   PT Stop Time 1018   PT Time Calculation (min) 28 min   Activity Tolerance Patient tolerated treatment well   Behavior During Therapy [facility] for tasks assessed/performed      Past Medical History:  Diagnosis Date  . Anemia   . Bowel obstruction ([place])   . GERD (gastroesophageal reflux disease)   . IBS (irritable bowel syndrome)    at age of 2  . Multiple sclerosis ([place])   . Sciatica 2009  . Vision abnormalities     Past Surgical History:  Procedure Laterality Date  . ABDOMINAL HYSTERECTOMY  2008   cervix and right ovary still intact  . KNEE ARTHROSCOPY  2010 and 2011   Left knee, x2    There were no vitals filed for this visit.      Subjective Assessment - 07/12/16 0952    Subjective Has been to the ED for chest pain, checked out okay. Was noted to have "kidney issues" and was referred back to her primary MD. No falls. No other complaints.    Limitations House hold activities;Walking;Sitting   How long can you stand comfortably? 5 mins    How long can you walk comfortably? about 5 mins   Patient Stated Goals "Build my strength and get rid of this low back pain and sensation thing in my hands."    Currently in Pain? Yes   Pain Score 10-Worst pain ever   Pain Location Generalized  head, back, hands, and feet   Pain Descriptors / Indicators  Headache;Pins and needles;Numbness;Tingling;Burning   Pain Type Acute pain   Pain Onset More than a month ago   Pain Frequency Constant   Aggravating Factors  increased activity   Pain Relieving Factors tramadol- does not help except to take edge of headache            [facility] PT Assessment - 07/12/16 0956      6 Minute Walk- Baseline   6 Minute Walk- Baseline yes   BP (mmHg) 113/84   HR (bpm) 78   02 Sat (%RA) 96 %   Modified Borg Scale for Dyspnea 0- Nothing at all   Perceived Rate of Exertion (Borg) 6-     6 Minute walk- Post Test   6 Minute Walk Post Test yes   BP (mmHg) 123/85   HR (bpm) 74   02 Sat (%RA) 99 %   Modified Borg Scale for Dyspnea 2- Mild shortness of breath   Perceived Rate of Exertion (Borg) 13- Somewhat hard     6 minute walk test results    Aerobic Endurance Distance Walked 857   Endurance additional comments 2 standing rest breaks against wall for ~10 sec's each during 1st 3 minutes, then 2 seated rest breaks for ~20 sec's each during second 3 minutes  issued the following to pt's HEP: Functional Quadriceps: Sit to Stand    With arms crossed over your chest: Sit on edge of chair, feet flat on floor. Stand upright, extending knees fully. Slowly sit back down.  Repeat _10_ times per set. Do _1_ sets per session. Do _1-2_ sessions per day.  http://orth.exer.us/735   Copyright  VHI. All rights reserved.   Perform these in a corner with a chair in front of you for safety:  Feet Together (Compliant Surface) Varied Arm Positions - Eyes Closed    Stand on compliant surface: _pillow/s_ with feet together and arms at side. Close eyes and visualize upright position. Hold__30__ seconds. Repeat _3_ times per session. Do _1-2_ sessions per day.  Copyright  VHI. All rights reserved.    Feet Together (Compliant Surface) Head Motion - Eyes Closed    Stand on compliant surface: _pillow/s__ with feet together. Close eyes and move head slowly: 1.  Up and down x 10 reps 2. Left and right x 10 reps 3. Diagonally both ways x 10 reps Do _1-2_ sessions per day.  Copyright  VHI. All rights reserved.         PT Education - 07/12/16 2142    Education provided Yes   Education Details results of 6 minute walk test; HEP for balance   Person(s) Educated Patient   Methods Explanation;Demonstration;Verbal cues;Handout   Comprehension Verbalized understanding;Returned demonstration;Verbal cues required;Need further instruction          PT Short Term Goals - 07/03/16 1122      PT SHORT TERM GOAL #1   Title =LTGs           PT Long Term Goals - 07/03/16 1122      PT LONG TERM GOAL #1   Title Pt will be independent with HEP in order to indicate improved functional mobility and decreased fall risk.  (Target Date: 08/02/16)   Status New     PT LONG TERM GOAL #2   Title Pt will improve FGA to >/=25/30 in order to indicate decreased fall risk.     Status New     PT LONG TERM GOAL #3   Title Pt will verbalize return to community fitness in order to maintain gains made in therapy.     Status New     PT LONG TERM GOAL #4   Title Pt will ambulate >1000' over varying outdoor surfaces without AD at mod I level in order to indicate safe return to community and leisure activity.     Status New     PT LONG TERM GOAL #5   Title Will assess 6MWT and imrove distance by 150' in order to indicate improved functional endurance.     Status New          Plan - 07/12/16 0956    Clinical Impression Statement Today's skilled session focused on establishing baseline values for 6 minute walk test and then establishing an HEP to address strengthening and balance. No significant issues reported in session. Pt is progressing towards goals and should benefit from continued PT to progress toward unmet goals                                Rehab Potential Good   Clinical Impairments Affecting Rehab Potential unsure of diagnosis at this time   PT  Frequency 2x / week   PT Duration 4 weeks   PT Treatment/Interventions  ADLs/Self Care Home Management;Gait training;Stair training;Functional mobility training;Therapeutic activities;Therapeutic exercise;Balance training;Neuromuscular re-education;Patient/family education;Energy conservation;Vestibular   PT Next Visit Plan address LBP with exercise, try and fatigue-would probably do well on elliptical for a few mins and assess if LLE drags?, compliant surfaces, outdoors   Consulted and Agree with Plan of Care Patient      Patient will benefit from skilled therapeutic intervention in order to improve the following deficits and impairments:  Abnormal gait, Decreased activity tolerance, Decreased balance, Decreased endurance, Decreased mobility, Decreased strength, Impaired perceived functional ability, Impaired sensation, Pain (will address pain indirectly through exercise)  Visit Diagnosis: Other disturbances of skin sensation  Muscle weakness (generalized)  Unsteadiness on feet  Other abnormalities of gait and mobility     Problem List Patient Active Problem List   Diagnosis Date Noted  . High risk medication use 07/04/2016  . Multiple sclerosis ([place]) 06/19/2016  . Neuropathic pain   . Neurologic gait disorder   . Neurogenic bowel   . Neurogenic bladder   . Anemia of chronic disease   . Leukocytosis   . Steroid-induced hyperglycemia   . Transverse myelitis ([place]) 06/14/2016  . Allergic rhinitis 04/12/2016  . Medication overuse headache 03/17/2016  . Elevated blood pressure reading in office without diagnosis of hypertension 03/13/2016  . Otitis media 02/23/2014  . Sciatica associated with disorder of lumbosacral spine 01/15/2014  . Edema 01/15/2014  . Headache(784.0) 07/21/2013  . GERD (gastroesophageal reflux disease) 07/21/2013  . RBBB on EKG 08/01/2012  . IBS (irritable bowel syndrome) 06/19/2012  . Healthcare maintenance 06/19/2012    Willow Ora, PTA,  [facility] [address], [place], [place] [address] [phone] 07/12/16, 9:44 PM   Name: Teleah Villamar MRN: [id number] Date of Birth: 05-02-1960

## 2016-07-12 NOTE — Therapy (Signed)
[facility] [address] [place], [place], [address] Phone: [phone]   Fax:  [phone]  Occupational Therapy Treatment  Patient Details  Name: Tamara [name] MRN: [id number] Date of Birth: 20-Nov-1960 Referring Provider: Dr. Letta Pate  Encounter Date: 07/12/2016      OT End of Session - 07/12/16 1219    Visit Number 3   Number of Visits 8   Date for OT Re-Evaluation 07/31/16   Authorization Type BCBS - 30 visit limit for OT   OT Start Time 1017   OT Stop Time 1100   OT Time Calculation (min) 43 min   Activity Tolerance Patient tolerated treatment well   Behavior During Therapy [facility] for tasks assessed/performed      Past Medical History:  Diagnosis Date  . Anemia   . Bowel obstruction ([place])   . GERD (gastroesophageal reflux disease)   . IBS (irritable bowel syndrome)    at age of 73  . Multiple sclerosis ([place])   . Sciatica 2009  . Vision abnormalities     Past Surgical History:  Procedure Laterality Date  . ABDOMINAL HYSTERECTOMY  2008   cervix and right ovary still intact  . KNEE ARTHROSCOPY  2010 and 2011   Left knee, x2    There were no vitals filed for this visit.      Subjective Assessment - 07/12/16 1023    Subjective  I went to get the items that Santiago Glad told me about (for hand coordination)   Pertinent History see epic - newly diagnosed with MS   Patient Stated Goals to use my hands better   Currently in Pain? Yes   Pain Score 10-Worst pain ever   Pain Location Hand   Pain Orientation Right;Left   Pain Descriptors / Indicators Pins and needles;Tingling;Burning   Pain Type Acute pain   Pain Radiating Towards hands   Pain Onset More than a month ago   Pain Frequency Constant   Aggravating Factors  increased activity   Pain Relieving Factors steroid injections   Effect of Pain on Daily Activities difficult to use my hands   Multiple Pain Sites No                      OT  Treatments/Exercises (OP) - 07/12/16 0001      ADLs   Cooking Patient enjoyed making cakes and working with fondant prior to diagnosis.  Encouraged patient to return to this activity- as it is extremely motivating and also facilitates functional hand use.     ADL Comments Reinforced the many benefits of  the [employer], and some potential topics that may be of interest, and support that may be avaialble.  Patient indicated that she planned to reach out.       Exercises   Exercises Hand     Hand Exercises   Theraputty - Roll red   Theraputty - Grip red   Theraputty - Pinch red   Theraputty - Locate Pegs red  encouraging resistant extension for digits                OT Education - 07/12/16 1218    Education provided Yes   Education Details Therapy putty - red   Person(s) Educated Patient   Methods Explanation;Demonstration;Verbal cues;Handout   Comprehension Verbalized understanding;Returned demonstration             OT Long Term Goals - 07/11/16 1626      OT  LONG TERM GOAL #1   Title Pt will be mod I with HEP for bilateral coordination, grip strength - 07/31/2016   Status On-going     OT LONG TERM GOAL #2   Title Pt will improve grip strength for RUE by at least 8 pounds, L grip by at least 5 pounds to assist with functional activities (R= 15, L = 20).    Status On-going     OT LONG TERM GOAL #3   Title Pt will demonstrate ability to use vision to compensate for sensory impairment when using hands during functional tasks.    Status On-going     OT LONG TERM GOAL #4   Title Pt will verbalize understanding of AE to ease buttoning, tying shoes, opening containers, etc for ADL and IADL activities.    Status On-going     OT LONG TERM GOAL #5   Title Pt will demonstrate improved coordination as evidenced by decreasing time on 9 hole peg test in total by at least 6 seconds to assist with functional tasks (R= 30.83, L= 33.41)   Status On-going     OT LONG  TERM GOAL #6   Title Pt will vebalize understanding of desensitization strategies for B hands.    Status On-going     OT LONG TERM GOAL #7   Title Pt will demonstate understanding of MS literature provided for website and local MS chapter.    Status On-going     OT LONG TERM GOAL #8   Title Pt will be mod I with wear and care of splints PRN (pt reports that her hands feels like they curl up if she doesn't keep moving them - worse at night- may splint)   Status On-going               Plan - 07/12/16 1219    Clinical Impression Statement Patient is concerned regarding constant hand pain- but is actively working to use her hands daily.  Patient is progressing steadily toward OT goals.     Rehab Potential Good   OT Frequency 2x / week   OT Duration 4 weeks   OT Treatment/Interventions Self-care/ADL training;Moist Heat;Contrast Bath;Fluidtherapy;Parrafin;Ultrasound;Therapeutic exercise;Neuromuscular education;Energy conservation;DME and/or AE instruction;Splinting;Therapeutic activities;Patient/family education   Plan provide desensitization protocol, visual compensation to guide hand use   Consulted and Agree with Plan of Care Patient      Patient will benefit from skilled therapeutic intervention in order to improve the following deficits and impairments:  Decreased activity tolerance, Decreased coordination, Decreased knowledge of use of DME, Decreased strength, Impaired UE functional use, Impaired sensation, Pain  Visit Diagnosis: Other lack of coordination  Other disturbances of skin sensation  Muscle weakness (generalized)  Unsteadiness on feet    Problem List Patient Active Problem List   Diagnosis Date Noted  . High risk medication use 07/04/2016  . Multiple sclerosis ([place]) 06/19/2016  . Neuropathic pain   . Neurologic gait disorder   . Neurogenic bowel   . Neurogenic bladder   . Anemia of chronic disease   . Leukocytosis   . Steroid-induced hyperglycemia    . Transverse myelitis ([place]) 06/14/2016  . Allergic rhinitis 04/12/2016  . Medication overuse headache 03/17/2016  . Elevated blood pressure reading in office without diagnosis of hypertension 03/13/2016  . Otitis media 02/23/2014  . Sciatica associated with disorder of lumbosacral spine 01/15/2014  . Edema 01/15/2014  . Headache(784.0) 07/21/2013  . GERD (gastroesophageal reflux disease) 07/21/2013  . RBBB on EKG 08/01/2012  .  IBS (irritable bowel syndrome) 06/19/2012  . Healthcare maintenance 06/19/2012    Mariah Milling, OTR/L 07/12/2016, 12:23 PM  [facility] [address] [place], [place], [address] Phone: [phone]   Fax:  [phone]  Name: Tamara [name] MRN: [id number] Date of Birth: [birth year]

## 2016-07-12 NOTE — Telephone Encounter (Signed)
Sent to me in error. 

## 2016-07-12 NOTE — Telephone Encounter (Signed)
I have spoken with RAS regarding pt. Ok to increase Tramadol to tid if needed. I have spoken with Phelicia this afternoon.  She sts. she has made an appt. with pcp tomorrow to r/o UTI--has increased lbp and dysuria.  In the meantime, she will continue Tramadol for pain.  As she is seeing her pcp tomorrow, I did not offer increased dose of Tramadol/fim

## 2016-07-12 NOTE — Patient Instructions (Addendum)
Functional Quadriceps: Sit to Stand    With arms crossed over your chest: Sit on edge of chair, feet flat on floor. Stand upright, extending knees fully. Slowly sit back down.  Repeat _10_ times per set. Do _1_ sets per session. Do _1-2_ sessions per day.  http://orth.exer.us/735   Copyright  VHI. All rights reserved.   Perform these in a corner with a chair in front of you for safety:  Feet Together (Compliant Surface) Varied Arm Positions - Eyes Closed    Stand on compliant surface: _pillow/s_ with feet together and arms at side. Close eyes and visualize upright position. Hold__30__ seconds. Repeat _3_ times per session. Do _1-2_ sessions per day.  Copyright  VHI. All rights reserved.    Feet Together (Compliant Surface) Head Motion - Eyes Closed    Stand on compliant surface: _pillow/s__ with feet together. Close eyes and move head slowly: 1. Up and down x 10 reps 2. Left and right x 10 reps 3. Diagonally both ways x 10 reps Do _1-2_ sessions per day.  Copyright  VHI. All rights reserved.

## 2016-07-12 NOTE — Patient Instructions (Signed)
1. Grip Strengthening (Resistive Putty)   Squeeze putty using thumb and all fingers. Repeat 15 times. Do 1 sessions per day.   Extension (Assistive Putty)   Roll putty back and forth, being sure to use all fingertips. Repeat 3 times. Do 1 sessions per day.  Then pinch as below.   Palmar Pinch Strengthening (Resistive Putty)   Pinch putty between thumb and each fingertip in turn after rolling out    Finger and Thumb Extension (Resistive Putty)   With thumb and all fingers in center of putty donut, stretch out. Repeat 5-10 times. Do 1 sessions per day.        MP Flexion (Resistive Putty)   Bending only at large knuckles, press putty down against thumb. Keep fingertips straight. Repeat 10 times. Do 2 sessions per day.

## 2016-07-13 ENCOUNTER — Ambulatory Visit (INDEPENDENT_AMBULATORY_CARE_PROVIDER_SITE_OTHER): Payer: BLUE CROSS/BLUE SHIELD | Admitting: Internal Medicine

## 2016-07-13 VITALS — BP 127/65 | HR 84 | Temp 98.2°F | Ht 63.5 in | Wt 223.2 lb

## 2016-07-13 DIAGNOSIS — G35 Multiple sclerosis: Secondary | ICD-10-CM | POA: Diagnosis not present

## 2016-07-13 DIAGNOSIS — G8929 Other chronic pain: Secondary | ICD-10-CM

## 2016-07-13 DIAGNOSIS — M545 Low back pain: Secondary | ICD-10-CM | POA: Diagnosis not present

## 2016-07-13 DIAGNOSIS — N949 Unspecified condition associated with female genital organs and menstrual cycle: Secondary | ICD-10-CM

## 2016-07-13 DIAGNOSIS — I4510 Unspecified right bundle-branch block: Secondary | ICD-10-CM

## 2016-07-13 LAB — POCT URINALYSIS DIPSTICK
Bilirubin, UA: NEGATIVE
GLUCOSE UA: NEGATIVE
KETONES UA: NEGATIVE
Nitrite, UA: NEGATIVE
Protein, UA: NEGATIVE
RBC UA: NEGATIVE
SPEC GRAV UA: 1.02 (ref 1.010–1.025)
Urobilinogen, UA: 0.2 E.U./dL
pH, UA: 6 (ref 5.0–8.0)

## 2016-07-13 NOTE — Patient Instructions (Signed)
[name] it was nice seeing you today.   Labwork has been ordered to check your renal function. I will call you when I get the result.

## 2016-07-13 NOTE — Progress Notes (Signed)
Medicine attending: Medical history, presenting problems, physical findings, and medications, reviewed with resident physician Dr Tamara [name] on the day of the patient visit and I concur with her evaluation and management plan. Tamara [name] known to me from recent hospitalization for acute on chronic neurologic symptoms with weakness and paresthesias, including saddle paresthesias with loss of bladder and bowel control. New dx of multiple sclerosis made. Nice response to initial pulse high dose steroids. Still with some atypical residual symptoms detailed in Dr Tamara [name]'s note. She will continue to follow up with Neurology.

## 2016-07-13 NOTE — Progress Notes (Signed)
   CC: Patient is here for ED follow-up and also complaining of a burning sensation in her genital area.  HPI:  [name] is a 56 y.o. female with a past medical history of conditions listed below presenting to the clinic for an ED follow-up and is also complaining of a burning sensation in her genital area. Please see problem based charting for the status of the patient's current and chronic medical conditions.   Past Medical History:  Diagnosis Date  . Anemia   . Bowel obstruction ([place])   . GERD (gastroesophageal reflux disease)   . IBS (irritable bowel syndrome)    at age of 70  . Multiple sclerosis ([place])   . Sciatica 2009  . Vision abnormalities     Review of Systems: Please see problem based charting for the status of the patient's current and chronic medical conditions.   Physical Exam:  Vitals:   07/13/16 1121  BP: 127/65  Pulse: 84  Temp: 98.2 F (36.8 C)  TempSrc: Oral  SpO2: 98%  Weight: 223 lb 3.2 oz (101.2 kg)  Height: 5' 3.5" (1.613 m)   Physical Exam  Constitutional: She is oriented to person, place, and time. She appears well-developed and well-nourished. No distress.  HENT:  Head: Normocephalic and atraumatic.  Eyes: Right eye exhibits no discharge. Left eye exhibits no discharge.  Cardiovascular: Normal rate, regular rhythm and intact distal pulses.   Pulmonary/Chest: Effort normal and breath sounds normal. No respiratory distress. She has no wheezes. She has no rales.  Abdominal: Soft. Bowel sounds are normal. She exhibits no distension. There is no tenderness.  No suprapubic tenderness  Genitourinary:  Genitourinary Comments: No CVA tenderness bilaterally  Musculoskeletal: She exhibits no edema.  Lumbar spine nontender to palpation.  Neurological: She is alert and oriented to person, place, and time.  Skin: Skin is warm and dry.    Assessment & Plan:   See Encounters Tab for problem based charting.  Patient discussed with Dr.  Beryle Beams

## 2016-07-13 NOTE — Assessment & Plan Note (Signed)
Assessment Patient recently visited the ED with a complaint of chest pain. Workup was negative for ACS and her chest pain resolved with GI cocktail and Tylenol. She was found to have a right bundle branch block on her EKG, similar to a prior tracing. Patient was concerned about the right bundle branch block. At present, she remains asymptomatic - no chest pain or episodes of syncope.  Plan -Continue to monitor. Advised her to call the clinic if she experiences any dizziness or episodes of syncope.

## 2016-07-13 NOTE — Assessment & Plan Note (Addendum)
History of present illness Patient recently visited the ED and was found to have a mildly elevated creatinine of 1.1. Creatinine was 0.8 the previous month. Patient reports having adequate oral intake. Denies having any dysuria. Does state that she has been urinating less and is having urinary urgency for the past few days. Also reports having left-sided groin pain for the past 3 days. Denies having any suprapubic pain. Reports having a burning sensation inside her genital region.  Denies having any vaginal discharge or bleeding and states she has not been sexually active in over a year. Reports having chronic lower back pain.   Assessment Urine dipstick checked at this visit not suggestive of infection. Her left-sided groin pain is not likely secondary to nephrolithiasis and she does not have blood on her urine dipstick. Per patient, her lower back pain is chronic. Burning sensation in her genital region could likely be related to her MS. She is not complaining of any vaginal discharge and has not been sexually active in over a year. She is not having urinary retention as a bladder scan done during this visit showing 39 mL volume.   Plan -Recheck BMP to assess renal function   Addendum to 15 2018 at 5:43 PM: Creatinine 0.8 on repeat labs. Tried calling the patient on her home and mobile numbers but could not reach her.

## 2016-07-14 LAB — BMP8+ANION GAP
Anion Gap: 15 mmol/L (ref 10.0–18.0)
BUN / CREAT RATIO: 17 (ref 9–23)
BUN: 14 mg/dL (ref 6–24)
CO2: 25 mmol/L (ref 20–29)
CREATININE: 0.84 mg/dL (ref 0.57–1.00)
Calcium: 8.7 mg/dL (ref 8.7–10.2)
Chloride: 103 mmol/L (ref 96–106)
GFR, EST AFRICAN AMERICAN: 90 mL/min/{1.73_m2} (ref >59)
GFR, EST NON AFRICAN AMERICAN: 78 mL/min/{1.73_m2} (ref >59)
Glucose: 79 mg/dL (ref 65–99)
Potassium: 4.2 mmol/L (ref 3.5–5.2)
Sodium: 143 mmol/L (ref 134–144)

## 2016-07-17 ENCOUNTER — Encounter: Payer: Self-pay | Admitting: Occupational Therapy

## 2016-07-17 ENCOUNTER — Telehealth: Payer: Self-pay | Admitting: Neurology

## 2016-07-17 ENCOUNTER — Ambulatory Visit: Payer: BLUE CROSS/BLUE SHIELD | Admitting: Physical Therapy

## 2016-07-17 ENCOUNTER — Ambulatory Visit: Payer: BLUE CROSS/BLUE SHIELD | Admitting: Occupational Therapy

## 2016-07-17 DIAGNOSIS — R2681 Unsteadiness on feet: Secondary | ICD-10-CM | POA: Diagnosis not present

## 2016-07-17 DIAGNOSIS — M545 Low back pain: Secondary | ICD-10-CM | POA: Diagnosis not present

## 2016-07-17 DIAGNOSIS — R208 Other disturbances of skin sensation: Secondary | ICD-10-CM

## 2016-07-17 DIAGNOSIS — M6281 Muscle weakness (generalized): Secondary | ICD-10-CM | POA: Diagnosis not present

## 2016-07-17 DIAGNOSIS — R278 Other lack of coordination: Secondary | ICD-10-CM | POA: Diagnosis not present

## 2016-07-17 DIAGNOSIS — R2689 Other abnormalities of gait and mobility: Secondary | ICD-10-CM | POA: Diagnosis not present

## 2016-07-17 MED ORDER — OXCARBAZEPINE 300 MG PO TABS: 300.0000 mg | ORAL_TABLET | Freq: Three times a day (TID) | ORAL | 1 refills | Status: DC

## 2016-07-17 NOTE — Therapy (Signed)
[facility] [address] [place], [place], [address] Phone: [phone]   Fax:  [phone]  Occupational Therapy Treatment  Patient Details  Name: Tamara [name] MRN: [id number] Date of Birth: [birth year] Referring Provider: Dr. Letta Pate  Encounter Date: 07/17/2016      OT End of Session - 07/17/16 1331    Visit Number 4   Number of Visits 8   Date for OT Re-Evaluation 07/31/16   Authorization Type BCBS - 30 visit limit for OT   OT Start Time 1150   OT Stop Time 1232   OT Time Calculation (min) 42 min   Activity Tolerance Patient tolerated treatment well      Past Medical History:  Diagnosis Date  . Anemia   . Bowel obstruction ([place])   . GERD (gastroesophageal reflux disease)   . IBS (irritable bowel syndrome)    at age of 89  . Multiple sclerosis ([place])   . Sciatica 2009  . Vision abnormalities     Past Surgical History:  Procedure Laterality Date  . ABDOMINAL HYSTERECTOMY  2008   cervix and right ovary still intact  . KNEE ARTHROSCOPY  2010 and 2011   Left knee, x2    There were no vitals filed for this visit.      Subjective Assessment - 07/17/16 1154    Subjective  I am terrible I had a terrible weekend my pain is really bad   Pertinent History see epic - newly diagnosed with MS   Patient Stated Goals to use my hands better   Currently in Pain? Yes   Pain Score 10-Worst pain ever  "its a 14 but you don't ask that"  however pt is able to use hands functionally    Pain Location Hand   Pain Orientation Right   Pain Descriptors / Indicators Burning;Aching;Tingling   Pain Type Acute pain   Pain Onset 1 to 4 weeks ago   Pain Frequency Constant   Aggravating Factors  increased activtiy - pt however also states that the only thing that makes them feel better is flexing her hands which she does frequently   Pain Relieving Factors none   Effect of Pain on Daily Activities difficult to use my  hands    Pain Score 10  however pt is ambulating and no noticeable limp or gait deviation.   Pain Location Leg  knee and the hip   Pain Orientation Left   Pain Descriptors / Indicators Aching   Pain Type Acute pain   Pain Onset In the past 7 days  started this weekend   Pain Frequency Intermittent   Aggravating Factors  worse in the morning, walking too long   Pain Relieving Factors gettting up and moving around                       OT Treatments/Exercises (OP) - 07/17/16 0001      ADLs   ADL Comments Issued hand out sheet and instruction for desensitization program for B hands.  Pt able to verbalize understanding.  ALso issued information on MS aquatic program as well as local office where pt could find local support groups.  Also addressed AE for use for cooking, opening jars, using utensils and a button hook.  Encouraged pt to explore the internet and stores that carry [employer] products to see what she might find helpful - pt verbalized understanding and stated "I am going to do that this afternoon -  there are alot of helpful things here."  Also encouraged pt to contact MD about parasthesias in hands given that she would rate pain a 14 on 0-10 scale.  Pt stated she would call today.                  OT Education - 07/17/16 1328    Education provided Yes   Education Details desensitization program, MS aquatic program, local MS support group info, AE for increasing ease of IADL tasks   Person(s) Educated Patient   Methods Explanation;Handout;Demonstration   Comprehension Verbalized understanding;Returned demonstration             OT Long Term Goals - 07/17/16 1329      OT LONG TERM GOAL #1   Title Pt will be mod I with HEP for bilateral coordination, grip strength - 07/31/2016   Status On-going     OT LONG TERM GOAL #2   Title Pt will improve grip strength for RUE by at least 8 pounds, L grip by at least 5 pounds to assist with functional activities (R=  15, L = 20).    Status On-going     OT LONG TERM GOAL #3   Title Pt will demonstrate ability to use vision to compensate for sensory impairment when using hands during functional tasks.    Status On-going     OT LONG TERM GOAL #4   Title Pt will verbalize understanding of AE to ease buttoning, tying shoes, opening containers, etc for ADL and IADL activities.    Status Achieved     OT LONG TERM GOAL #5   Title Pt will demonstrate improved coordination as evidenced by decreasing time on 9 hole peg test in total by at least 6 seconds to assist with functional tasks (R= 30.83, L= 33.41)   Status On-going     OT LONG TERM GOAL #6   Title Pt will vebalize understanding of desensitization strategies for B hands.    Status Achieved     OT LONG TERM GOAL #7   Title Pt will demonstate understanding of MS literature provided for website and local MS chapter.    Status Achieved     OT LONG TERM GOAL #8   Title Pt will be mod I with wear and care of splints PRN (pt reports that her hands feels like they curl up if she doesn't keep moving them - worse at night- may splint)   Status On-going               Plan - 07/17/16 1329    Clinical Impression Statement Pt progressing toward goals. Pt continues to rate pain very high in both hands and in LLE however demostrates ability to use hands functionally   Rehab Potential Good   OT Frequency 2x / week   OT Duration 4 weeks   OT Treatment/Interventions Self-care/ADL training;Moist Heat;Contrast Bath;Fluidtherapy;Parrafin;Ultrasound;Therapeutic exercise;Neuromuscular education;Energy conservation;DME and/or AE instruction;Splinting;Therapeutic activities;Patient/family education   Plan try button hook ,zipper pull, assess ability to tie shoes.  Grip strength activities as tolerated.    Consulted and Agree with Plan of Care Patient      Patient will benefit from skilled therapeutic intervention in order to improve the following deficits and  impairments:  Decreased activity tolerance, Decreased coordination, Decreased knowledge of use of DME, Decreased strength, Impaired UE functional use, Impaired sensation, Pain  Visit Diagnosis: Other disturbances of skin sensation  Muscle weakness (generalized)  Other lack of coordination    Problem List  Patient Active Problem List   Diagnosis Date Noted  . Genital symptoms, female 07/13/2016  . High risk medication use 07/04/2016  . Multiple sclerosis ([place]) 06/19/2016  . Neuropathic pain   . Neurologic gait disorder   . Neurogenic bowel   . Neurogenic bladder   . Anemia of chronic disease   . Leukocytosis   . Steroid-induced hyperglycemia   . Transverse myelitis ([place]) 06/14/2016  . Allergic rhinitis 04/12/2016  . Medication overuse headache 03/17/2016  . Elevated blood pressure reading in office without diagnosis of hypertension 03/13/2016  . Otitis media 02/23/2014  . Sciatica associated with disorder of lumbosacral spine 01/15/2014  . Edema 01/15/2014  . Headache(784.0) 07/21/2013  . GERD (gastroesophageal reflux disease) 07/21/2013  . RBBB on EKG 08/01/2012  . IBS (irritable bowel syndrome) 06/19/2012  . Healthcare maintenance 06/19/2012    Quay Burow, OTR/L 07/17/2016, 1:32 PM  [facility] [address] [place], [place], [address] Phone: [phone]   Fax:  [phone]  Name: Tamara [name] MRN: [id number] Date of Birth: July 07, 1960

## 2016-07-17 NOTE — Telephone Encounter (Signed)
Pt would like a call back from [facility] regarding medication for hand sensations besides tramadol per visit with therapist. Best call back is [phone]

## 2016-07-17 NOTE — Patient Instructions (Signed)
Desensitization Techniques  Perform these exercises ever 2 hours for 15 minute sessions.  Progress to the next exercises when the exercises you are doing become easy.  1)  Using light pressure, rub the various textures along with the hypersensitive area:  A.  [place]  C.  Wool  G.  Cotton material  D.  Terry cloth  2)  With the same textures use a firmer pressure. 3)  Use a hand held vibrator and massage along the sensitive area. 4)  With a small dowel rod, eraser on a pencil or base of an ink pen tap along the sensitive area. 5)  Use an empty roll-on deodorant bottle to roll along the sensitive area. 6)  Place your hand/forearm in separate containers of the following items:  A.  Sand  D.  Dry lentil beans  B.  Dry Rice  E.  Dry kidney beans  C.  Ball bearings F.  Dry pinto beans   Message 10 minutes, at least 2-3 times a day, unless you are getting tender afterwards

## 2016-07-17 NOTE — Telephone Encounter (Signed)
I have spoken with Tamara [name] and per RAS, offered Gabapentin 300-300-600.  She sts. she has tried Gabapentin in the past and it caused rash, hallucinations and depression.  Per RAS, ok for Trileptal 300mg  tid.  She is agreeable.  Rx. escribed to [facility]

## 2016-07-18 ENCOUNTER — Ambulatory Visit: Payer: BLUE CROSS/BLUE SHIELD | Admitting: Occupational Therapy

## 2016-07-18 ENCOUNTER — Other Ambulatory Visit: Payer: Self-pay | Admitting: Internal Medicine

## 2016-07-20 ENCOUNTER — Ambulatory Visit: Payer: BLUE CROSS/BLUE SHIELD | Admitting: Physical Therapy

## 2016-07-20 ENCOUNTER — Telehealth: Payer: Self-pay | Admitting: Neurology

## 2016-07-20 DIAGNOSIS — M545 Low back pain, unspecified: Secondary | ICD-10-CM

## 2016-07-20 DIAGNOSIS — R2689 Other abnormalities of gait and mobility: Secondary | ICD-10-CM | POA: Diagnosis not present

## 2016-07-20 DIAGNOSIS — M6281 Muscle weakness (generalized): Secondary | ICD-10-CM | POA: Diagnosis not present

## 2016-07-20 DIAGNOSIS — R278 Other lack of coordination: Secondary | ICD-10-CM | POA: Diagnosis not present

## 2016-07-20 DIAGNOSIS — R2681 Unsteadiness on feet: Secondary | ICD-10-CM

## 2016-07-20 DIAGNOSIS — R208 Other disturbances of skin sensation: Secondary | ICD-10-CM | POA: Diagnosis not present

## 2016-07-20 NOTE — Patient Instructions (Addendum)
Pelvic Tilt: Posterior - Legs Bent (Supine)    Tighten stomach and flatten back by rolling pelvis down. Hold ___3_ seconds. Relax. Repeat _10___ times per set. Do ___1-2_ sets per session. Do __2-3__ sessions per day.  http://orth.exer.us/203   Copyright  VHI. All rights reserved.  Pelvic Tilt: Anterior - Legs Bent (Supine)    Rotate pelvis up and arch back. Hold 3____ seconds. Relax. Repeat __10__ times per set. Do __1-2__ sets per session. Do _2-3___ sessions per day.  http://orth.exer.us/213   Copyright  VHI. All rights reserved.  Knee to Chest (Flexion)    Pull knee toward chest. Feel stretch in lower back or buttock area. Breathing deeply, Hold _10-15___ seconds. Repeat with other knee. Repeat __3__ times. Do _1-2___ sessions per day.  http://gt2.exer.us/226   Copyright  VHI. All rights reserved.  Trunk: Rotation    Lie on back on firm, flat surface with knees bent. Keep head and shoulders flat, slowly lower knees to floor.  Hold __15-30 __ seconds. Repeat _3___ times, alternating sides. Do ___1-2_ sessions per day. CAUTION: Movement should be gentle, steady and slow.  Copyright  VHI. All rights reserved.    Provided handout for seated anterior/posterior pelvic tilts x 10 reps  -Positioning information:  Pillow between knees for sidelying, pillow under knees for supine sleeping        Pillow or towel roll for low back area with sitting; advised pt not to sit for long periods of time in recliner due to positioning in slumped posture.

## 2016-07-20 NOTE — Therapy (Signed)
[facility] [address] [place], [place], [address] Phone: [phone]   Fax:  [phone]  Physical Therapy Treatment  Patient Details  Name: Tamara [name] MRN: [id number] Date of Birth: [birth year] Referring Provider: Claudette Laws, MD  Encounter Date: 07/20/2016      PT End of Session - 07/20/16 1509    Visit Number 3   Number of Visits 9   Date for PT Re-Evaluation 08/02/16   Authorization Type BCBS-30 visit limit for PT   Authorization - Visit Number 3   Authorization - Number of Visits 30   PT Start Time 1417  pt arrived late   PT Stop Time 1455   PT Time Calculation (min) 38 min   Activity Tolerance Patient tolerated treatment well  patient rates pain in back as 5/10 at end of session   Behavior During Therapy [facility] for tasks assessed/performed      Past Medical History:  Diagnosis Date  . Anemia   . Bowel obstruction ([facility])   . GERD (gastroesophageal reflux disease)   . IBS (irritable bowel syndrome)    at age of 39  . Multiple sclerosis ([facility])   . Sciatica 2009  . Vision abnormalities     Past Surgical History:  Procedure Laterality Date  . ABDOMINAL HYSTERECTOMY  2008   cervix and right ovary still intact  . KNEE ARTHROSCOPY  2010 and 2011   Left knee, x2    There were no vitals filed for this visit.      Subjective Assessment - 07/20/16 1419    Subjective "Doing terrible" today, since I've been taking the new medication, I've had horrible headaches and "internal itching" and swelling of my tongue.  I will let my doctor know that I've stopped taking it.   Limitations House hold activities;Walking;Sitting   How long can you stand comfortably? 5 mins    How long can you walk comfortably? about 5 mins   Patient Stated Goals "Build my strength and get rid of this low back pain and sensation thing in my hands."    Currently in Pain? Yes   Pain Score 10-Worst pain ever   Pain Location Leg    Pain Orientation Left   Pain Descriptors / Indicators Aching;Shooting   Pain Onset In the past 7 days   Pain Frequency Intermittent   Aggravating Factors  being up on feet too long, sitting in one spot too long   Pain Relieving Factors Tramadol alleviates   Multiple Pain Sites Yes   Pain Score 8   Pain Location Back   Pain Orientation Mid;Lower   Pain Descriptors / Indicators Aching;Pounding   Pain Type Acute pain   Pain Onset In the past 7 days   Pain Frequency Intermittent   Aggravating Factors  sitting or standing too long   Pain Relieving Factors changing positions                         [facility] Adult PT Treatment/Exercise - 07/20/16 0001      Transfers   Transfers Sit to Stand;Stand to Sit   Sit to Stand 7: Independent   Stand to Sit 7: Independent   Number of Reps 10 reps  Review of HEP-pt is independent     High Level Balance   High Level Balance Comments Review of HEP from last visit:  corner balance feet together with EC x 30 sec, EC with head turns; on mat with  marching in place x 10reps, forward kicks x 10 reps, then forward step taps x 10 reps     Exercises   Exercises Lumbar     Lumbar Exercises: Stretches   Single Knee to Chest Stretch 3 reps  15 sec   Lower Trunk Rotation 3 reps  15 sec   Lower Trunk Rotation Limitations Initiated with trunk rotation rocking then into stretch     Lumbar Exercises: Seated   Other Seated Lumbar Exercises Seated pelvic tilts x 10 reps     Lumbar Exercises: Supine   Other Supine Lumbar Exercises Pelvic tilt x 10 reps in supine, with initial tactile cues.     Other Supine Lumbar Exercises Discussed and demo supine positioning for sleeping using pillow under knees for supine, pillow between knees in sidelying.        Discussed/demo positioning with pillow behind back for lumbar support in sitting.  Educated patient in avoiding staying too long in recliner to avoid trunk flexed/reclined position.            PT Education - 07/20/16 1508    Education provided Yes   Education Details Low back exercises as part of HEP; positioning education- to address pain   Person(s) Educated Patient   Methods Explanation;Demonstration;Handout   Comprehension Verbalized understanding;Returned demonstration          PT Short Term Goals - 07/03/16 1122      PT SHORT TERM GOAL #1   Title =LTGs           PT Long Term Goals - 07/03/16 1122      PT LONG TERM GOAL #1   Title Pt will be independent with HEP in order to indicate improved functional mobility and decreased fall risk.  (Target Date: 08/02/16)   Status New     PT LONG TERM GOAL #2   Title Pt will improve FGA to >/=25/30 in order to indicate decreased fall risk.     Status New     PT LONG TERM GOAL #3   Title Pt will verbalize return to community fitness in order to maintain gains made in therapy.     Status New     PT LONG TERM GOAL #4   Title Pt will ambulate >1000' over varying outdoor surfaces without AD at mod I level in order to indicate safe return to community and leisure activity.     Status New     PT LONG TERM GOAL #5   Title Will assess and imrove distance by 150' in order to indicate improved functional endurance.     Status New               Plan - 07/20/16 1510    Clinical Impression Statement Skilled PT session focused on review of HEP and address low back pain through exercise, with pt reporting decrease in LBP from 8/10 to 5/10 at session's end.  Pt will continue to benefit from skilled PT to address balance, gait and strength.  Since eval, pt appears to have MS diagnosis, continued signicant pain in back and lower extremities   Rehab Potential Good   Clinical Impairments Affecting Rehab Potential Dx of MS since eval-   PT Frequency 2x / week   PT Duration 4 weeks   PT Treatment/Interventions ADLs/Self Care Home Management;Gait training;Stair training;Functional mobility training;Therapeutic  activities;Therapeutic exercise;Balance training;Neuromuscular re-education;Patient/family education;Energy conservation;Vestibular   PT Next Visit Plan review HEP and progress low back/core exercises.  Continue to work compliant  surfaces for high level balance   Consulted and Agree with Plan of Care Patient      Patient will benefit from skilled therapeutic intervention in order to improve the following deficits and impairments:  Abnormal gait, Decreased activity tolerance, Decreased balance, Decreased endurance, Decreased mobility, Decreased strength, Impaired perceived functional ability, Impaired sensation, Pain (will address pain indirectly through exercise)  Visit Diagnosis: Unsteadiness on feet  Acute midline low back pain without sciatica     Problem List Patient Active Problem List   Diagnosis Date Noted  . Genital symptoms, female 07/13/2016  . High risk medication use 07/04/2016  . Multiple sclerosis ([facility]) 06/19/2016  . Neuropathic pain   . Neurologic gait disorder   . Neurogenic bowel   . Neurogenic bladder   . Anemia of chronic disease   . Leukocytosis   . Steroid-induced hyperglycemia   . Transverse myelitis ([facility]) 06/14/2016  . Allergic rhinitis 04/12/2016  . Medication overuse headache 03/17/2016  . Elevated blood pressure reading in office without diagnosis of hypertension 03/13/2016  . Otitis media 02/23/2014  . Sciatica associated with disorder of lumbosacral spine 01/15/2014  . Edema 01/15/2014  . Headache(784.0) 07/21/2013  . GERD (gastroesophageal reflux disease) 07/21/2013  . RBBB on EKG 08/01/2012  . IBS (irritable bowel syndrome) 06/19/2012  . Healthcare maintenance 06/19/2012    Carnelius Hammitt W. 07/20/2016, 3:16 PM Gean Maidens., PT  [facility] [address] [place], [place], [address] Phone: [phone]   Fax:  [phone]  Name: Tamara [name] MRN: [id number] Date of Birth:  09-30-1960

## 2016-07-20 NOTE — Telephone Encounter (Signed)
I have spoken with Tamara [name] this afternoon--she will not take any more Trileptal.  Appt. with RAS given 07/25/16 at 0900 for further eval of dysesthesias/fim

## 2016-07-24 ENCOUNTER — Ambulatory Visit: Payer: BLUE CROSS/BLUE SHIELD | Admitting: Physical Therapy

## 2016-07-24 ENCOUNTER — Ambulatory Visit: Payer: BLUE CROSS/BLUE SHIELD | Admitting: Occupational Therapy

## 2016-07-24 ENCOUNTER — Encounter: Payer: Self-pay | Admitting: Physical Therapy

## 2016-07-24 DIAGNOSIS — R2689 Other abnormalities of gait and mobility: Secondary | ICD-10-CM | POA: Diagnosis not present

## 2016-07-24 DIAGNOSIS — R278 Other lack of coordination: Secondary | ICD-10-CM | POA: Diagnosis not present

## 2016-07-24 DIAGNOSIS — M6281 Muscle weakness (generalized): Secondary | ICD-10-CM | POA: Diagnosis not present

## 2016-07-24 DIAGNOSIS — R208 Other disturbances of skin sensation: Secondary | ICD-10-CM | POA: Diagnosis not present

## 2016-07-24 DIAGNOSIS — M545 Low back pain, unspecified: Secondary | ICD-10-CM

## 2016-07-24 DIAGNOSIS — R2681 Unsteadiness on feet: Secondary | ICD-10-CM

## 2016-07-25 ENCOUNTER — Encounter: Payer: Self-pay | Admitting: *Deleted

## 2016-07-25 ENCOUNTER — Ambulatory Visit: Payer: BLUE CROSS/BLUE SHIELD

## 2016-07-25 ENCOUNTER — Encounter: Payer: Self-pay | Admitting: Neurology

## 2016-07-25 ENCOUNTER — Inpatient Hospital Stay: Payer: BLUE CROSS/BLUE SHIELD | Admitting: Physical Medicine & Rehabilitation

## 2016-07-25 ENCOUNTER — Ambulatory Visit (INDEPENDENT_AMBULATORY_CARE_PROVIDER_SITE_OTHER): Payer: BLUE CROSS/BLUE SHIELD | Admitting: Neurology

## 2016-07-25 VITALS — BP 121/80 | HR 81 | Resp 18 | Ht 63.5 in | Wt 224.0 lb

## 2016-07-25 DIAGNOSIS — R269 Unspecified abnormalities of gait and mobility: Secondary | ICD-10-CM

## 2016-07-25 DIAGNOSIS — N319 Neuromuscular dysfunction of bladder, unspecified: Secondary | ICD-10-CM | POA: Diagnosis not present

## 2016-07-25 DIAGNOSIS — G35 Multiple sclerosis: Secondary | ICD-10-CM

## 2016-07-25 DIAGNOSIS — Z79899 Other long term (current) drug therapy: Secondary | ICD-10-CM

## 2016-07-25 DIAGNOSIS — G373 Acute transverse myelitis in demyelinating disease of central nervous system: Secondary | ICD-10-CM | POA: Diagnosis not present

## 2016-07-25 MED ORDER — IMIPRAMINE HCL 25 MG PO TABS: 25.0000 mg | ORAL_TABLET | Freq: Every day | ORAL | 5 refills | Status: DC

## 2016-07-25 NOTE — Progress Notes (Signed)
[facility]  PATIENT: Tamara [name] DOB: [birth year]  REFERRING DOCTOR OR PCP:  Nadean Corwin SOURCE: Patient, notes from emergency room and recent hospital stay ([facility]), imaging and lab reports, MRI images on PACS.  _________________________________   HISTORICAL  CHIEF COMPLAINT:  Chief Complaint  Patient presents with  . Multiple Sclerosis    Waiting to start Tysabri.  Would like to discuss dysesthesias bilat hands/fim    HISTORY OF PRESENT ILLNESS:   Tamara [name] Is a 56 year old woman with multiple sclerosis diagnosed in May 2018 after presenting with transverse myelitis.   MS:   She is in the process of being started on Tysabri   She did not get the JCV Ab test drawn at the [facility] visit and that delayed [place] a week or so.  Her JCV Ab test returned negative and her service request form has been submitted.   We hope to get her started within 1-2 weeks.     Gait/strength:  Currently, her gait is wide and off balanced.   Her legs feel heavy.  Her hands also seem mildly weak and clumsy.     Numbness/dysesthesia:   The numbness below her waist improved after the steroid treatment.    At times, the left face gets numb and this started last week.  She is numb from her waist down and she has more severe numbness in her hands.   She has an electric uncomfortable dysesthesia in both hands.    Gabapentin and Lyrica were not tolerated in the past.    Tegretol caused itching and Trileptal caused mild swelling in her mouth?  Bladder/bowel:  She also has had bladder issues.   She was unable to feel her groin and did not know when she went to the bathroom.   She no longer has hesitancy.   She has nocturia x 2.      Vision:  She denied any major change in vision or diplopia.    She noted a floater last month that came off/on.   Colors are normal and symmetric.     Fatigue/sleep:  She has noted fatigue that is more physical than cognitive.    Phentermine has helped and she has also lost  some weight that she gained earlier this year.    She is trying to exercise more.   She is sleeping poorly, with more issues falling asleep than staying asleep.      She wakes up twice to urinate and sometimes can't fall back asleep.  Mood/Cognition:  Mood is better though she is concerned about her new diagnosis of MS and is a little more anxious.   She denies any major depression but feels mood is mildly worse between her new symptoms and her weight gain.   She denies any change in cognition.    MS History:   On 06/10/16, she had the onset of numbness and clumsiness in her legs.   As the day went on, her hands also became numb and clumsy.  She felt numb from her waist down.    She went to the [facility] and had an xtay of her back and was referred to orthopedics     Two days later, she went to the ED when her symptoms worsened and she was seen and discharged.    She saw her internist who ordered a lumbar MRI.  The lumbar MRI showed degenerative changes that were mild at L4-L5 and the study was otherwise fairly normal.  She was referred to the ED and had an MRI of the brain, cervical spine and thoracic spine.    She was found to have a transverse myelitis in the cervical spine with an enhancing lesion in the posterior columns. There was also a smaller non-enhancing focus at C5   She also had 5-6 spots in her brain, the largest being periventricular on the right.  She was admitted to [facility] and had 3 days of IV Solumedrol followed by an oral taper.    She was transferred to rehabilitation and was discharged 5/30.   She walked out and felt much better.   Additionally, while she was in the hospital (06/15/2016) she had a lumbar puncture. The CSF was abnormal showing showing the presence  4 oligoclonal bands and an elevated IgG index of 0.9 (less than 0.7 is normal). The myelin basic protein was mildly elevated at 2.2.      He had another exacerbation after returning home with left leg dragging  and feeling more off balance with stumbling.   In retrospect, in 2017, she had an episode lasting a few weeks where she felt she was dragging her left leg some. This completely resolved and she did not have any other symptoms such as numbness at that time.   MRIs of the brain, cervical spine, thoracic spine and lumbar spine were personally reviewed. The MRI of the cervical spine shows an enhancing lesion posterior spinal cord adjacent to C2-C3. Additionally there is a nonenhancing focus adjacent to C5. In the brain, there are several T2/FLAIR hyperintense foci and the largest is in the right parietal lobe and there are 4 or 5 other small foci, one in the periventricular white matter of the right frontal lobe and the rest in the subcortical white matter.    Laboratory studies show 4 oligoclonal bands and an elevated IgG index of 0.9. Myelin basic protein was mildly elevated at 2.2. She had elevated glucose between 120 and 140 several times while getting steroids.   Her Vit D was mildly low (25.6) and she just started OTC supplementation.   REVIEW OF SYSTEMS: Constitutional: No fevers, chills, sweats, or change in appetite Eyes: No visual changes, double vision, eye pain Ear, nose and throat: No hearing loss, ear pain, nasal congestion, sore throat Cardiovascular: No chest pain, palpitations Respiratory: No shortness of breath at rest or with exertion.   No wheezes GastrointestinaI: No nausea, vomiting, diarrhea, abdominal pain, fecal incontinence Genitourinary: No dysuria, urinary retention or frequency.  No nocturia. Musculoskeletal: No neck pain, back pain Integumentary: No rash, pruritus, skin lesions Neurological: as above Psychiatric: No depression at this time.  No anxiety Endocrine: No palpitations, diaphoresis, change in appetite, change in weigh or increased thirst Hematologic/Lymphatic: No anemia, purpura, petechiae. Allergic/Immunologic: No itchy/runny eyes, nasal congestion, recent  allergic reactions, rashes  ALLERGIES: Allergies  Allergen Reactions  . Codeine Other (See Comments)    Delusions  . Dilaudid [Hydromorphone Hcl] Swelling    Tongue swells   . Paxil [Paroxetine Hcl] Other (See Comments)    Hallucinations and heavy periods   . Tegretol [Carbamazepine] Itching  . Trileptal [Oxcarbazepine] Swelling  . Adhesive [Tape] Hives, Itching and Rash    PAPER TAPE=   . Augmentin [Amoxicillin-Pot Clavulanate] Rash  . Gabapentin Rash    Pt. sts. Gabapentin caused rash, hallucinations and depression    HOME MEDICATIONS:  Current Outpatient Prescriptions:  .  b complex vitamins tablet, Take 1 tablet by mouth daily., Disp: , Rfl:  .  cetirizine (ZYRTEC ALLERGY) 10 MG tablet, Take 1 tablet (10 mg total) by mouth daily. (Patient taking differently: Take 10 mg by mouth daily as needed for allergies. ), Disp: 30 tablet, Rfl: 2 .  cholecalciferol (VITAMIN D) 400 UNITS TABS tablet, Take 400 Units by mouth daily. , Disp: , Rfl:  .  Collagen 500 MG CAPS, Take by mouth., Disp: , Rfl:  .  diclofenac sodium (VOLTAREN) 1 % GEL, Apply 2 g topically 3 (three) times daily., Disp: 1 Tube, Rfl: 0 .  docusate sodium (COLACE) 100 MG capsule, Take 1 capsule (100 mg total) by mouth daily., Disp: 10 capsule, Rfl: 0 .  fluticasone (FLONASE) 50 MCG/ACT nasal spray, Place 2 sprays into both nostrils daily. (Patient taking differently: Place 2 sprays into both nostrils daily as needed for allergies. ), Disp: 16 g, Rfl: 2 .  Multiple Vitamin (MULTIVITAMIN) tablet, Take 1 tablet by mouth daily., Disp: , Rfl:  .  Multiple Vitamins-Minerals (HAIR SKIN AND NAILS FORMULA) TABS, Take 1 tablet by mouth daily., Disp: , Rfl:  .  Omega-3 Fatty Acids (FISH OIL) 1000 MG CAPS, Take 1,000-2,000 mg by mouth See admin instructions. Take 2 capsules in the morning and take 1 capsule at bedtime , Disp: , Rfl:  .  pantoprazole (PROTONIX) 40 MG tablet, TAKE 1 TABLET BY MOUTH DAILY, Disp: 90 tablet, Rfl: 6 .   phentermine 30 MG capsule, Take 1 capsule (30 mg total) by mouth every morning., Disp: 30 capsule, Rfl: 5 .  traMADol (ULTRAM) 50 MG tablet, Take 1 tablet (50 mg total) by mouth 2 (two) times daily as needed for severe pain., Disp: 60 tablet, Rfl: 0 .  vitamin E 100 UNIT capsule, Take 100 Units by mouth daily., Disp: , Rfl:  .  natalizumab (TYSABRI) 300 MG/15ML injection, Inject 300 mg into the vein every 30 (thirty) days., Disp: , Rfl:   PAST MEDICAL HISTORY: Past Medical History:  Diagnosis Date  . Anemia   . Bowel obstruction (HCC)   . GERD (gastroesophageal reflux disease)   . IBS (irritable bowel syndrome)    at age of 66  . Multiple sclerosis (HCC)   . Sciatica 2009  . Vision abnormalities     PAST SURGICAL HISTORY: Past Surgical History:  Procedure Laterality Date  . ABDOMINAL HYSTERECTOMY  2008   cervix and right ovary still intact  . KNEE ARTHROSCOPY  2010 and 2011   Left knee, x2    FAMILY HISTORY: Family History  Problem Relation Age of Onset  . Cancer Father        Gallbladder  . Gallbladder disease Father   . Hypertension Brother   . Gallbladder disease Paternal Grandmother   . Colon cancer Neg Hx   . Colon polyps Neg Hx   . Esophageal cancer Neg Hx   . Kidney disease Neg Hx     SOCIAL HISTORY:  Social History   Social History  . Marital status: Married    Spouse name: N/A  . Number of children: 2  . Years of education: N/A   Occupational History  . Science writer    Social History Main Topics  . Smoking status: Never Smoker  . Smokeless tobacco: Never Used  . Alcohol use No  . Drug use: No  . Sexual activity: Not on file   Other Topics Concern  . Not on file   Social History Narrative  . No narrative on file     PHYSICAL EXAM  Vitals:   07/25/16  0903  BP: 121/80  Pulse: 81  Resp: 18  Weight: 224 lb (101.6 kg)  Height: 5' 3.5" (1.613 m)    Body mass index is 39.06 kg/m.   General: The patient is well-developed and  well-nourished and in no acute distress  Eyes:  Funduscopic exam shows normal optic discs and retinal vessels.   She has cataracts.     Neck: The neck is supple, no carotid bruits are noted.  The neck is nontender.  Cardiovascular: The heart has a regular rate and rhythm with a normal S1 and S2. There were no murmurs, gallops or rubs. Lungs are clear to auscultation.  Skin: Extremities are without significant edema.  Musculoskeletal:  Back is nontender  Neurologic Exam  Mental status: The patient is alert and oriented x 3 at the time of the examination. The patient has apparent normal recent and remote memory, with an apparently normal attention span and concentration ability.   Speech is normal.  Cranial nerves: Extraocular movements are full.   Facial symmetry is present. There is good facial sensation to soft touch bilaterally.Facial strength is normal.  Trapezius and sternocleidomastoid strength is normal. No dysarthria is noted.  The tongue is midline, and the patient has symmetric elevation of the soft palate. No obvious hearing deficits are noted.  Motor:  Muscle bulk is normal.   Tone is normal. Strength is  5 / 5 in all 4 extremities except 4+/5 intrinsic hand muscles   Sensory: Sensory testing shows normal and symmetric sensation to touch and vibration in the hands and the legs. This has greatly improved.,  Coordination: Cerebellar testing reveals good finger-nose-finger and now normal heel-to-shin bilaterally.  Gait and station: Station is normal.   Her gait is normal now though tandem is wide.   Romberg is negative now   Reflexes: Deep tendon reflexes are symmetric and normal in the arms. In the legs, the deep tendon reflexes are increased but she no longer has spread at the knees or clonus at the ankles.. Plantar responses are flexor.        DIAGNOSTIC DATA (LABS, IMAGING, TESTING) - I reviewed patient records, labs, notes, testing and imaging myself where  available.  Lab Results  Component Value Date   WBC 7.3 07/09/2016   HGB 10.2 (L) 07/09/2016   HCT 32.0 (L) 07/09/2016   MCV 88.9 07/09/2016   PLT 190 07/09/2016      Component Value Date/Time   NA 143 07/13/2016 1215   K 4.2 07/13/2016 1215   CL 103 07/13/2016 1215   CO2 25 07/13/2016 1215   GLUCOSE 79 07/13/2016 1215   GLUCOSE 91 07/09/2016 2107   BUN 14 07/13/2016 1215   CREATININE 0.84 07/13/2016 1215   CREATININE 1.01 07/08/2013 1440   CALCIUM 8.7 07/13/2016 1215   PROT 5.1 (L) 06/20/2016 0545   ALBUMIN 2.7 (L) 06/20/2016 0545   ALBUMIN 4.2 06/15/2016 1203   AST 14 (L) 06/20/2016 0545   ALT 20 06/20/2016 0545   ALKPHOS 47 06/20/2016 0545   BILITOT 0.4 06/20/2016 0545   GFRNONAA 78 07/13/2016 1215   GFRNONAA 64 07/08/2013 1440   GFRAA 90 07/13/2016 1215   GFRAA 74 07/08/2013 1440   No results found for: CHOL, HDL, LDLCALC, LDLDIRECT, TRIG, CHOLHDL No results found for: BJYN8G Lab Results  Component Value Date   VITAMINB12 2,209 (H) 06/15/2016   Lab Results  Component Value Date   TSH 0.351 06/15/2016       ASSESSMENT AND PLAN  Multiple  sclerosis (HCC)  Transverse myelitis (HCC)  High risk medication use  Neurogenic bladder  Neurologic gait disorder    1.    Tysabri 300 mg for MS.   We will try to get her scheduled soon 2.    Imipramine for stabbing/electric dysesthesias in hands and it may help sleep and nocturia.   3.    Stay active and exercise.     4.     She will return to see [place] for her first infusion and see me in an office visit in 3 months.   She is advised to call sooner if she has any new or worsening neurologic symptoms.    Dianelly Ferran A. Epimenio Foot, MD, PhD, [place]  07/25/2016, 9:16 AM Certified in Neurology, Clinical Neurophysiology, Sleep Medicine, Pain Medicine and Neuroimaging Dir., [facility]  [facility] [address] [place], [place] [address] [phone]

## 2016-07-25 NOTE — Therapy (Signed)
[facility] [address] [place], [place], [address] Phone: [phone]   Fax:  [phone]  Physical Therapy Treatment  Patient Details  Name: Tamara [name] MRN: [id number] Date of Birth: 26-Feb-1960 Referring Provider: Alysia Penna, MD  Encounter Date: 07/24/2016      PT End of Session - 07/24/16 1242    Visit Number 4   Number of Visits 9   Date for PT Re-Evaluation 08/02/16   Authorization Type [employer]-30 visit limit for PT   Authorization - Visit Number 4   Authorization - Number of Visits 30   PT Start Time 2992   PT Stop Time 1315   PT Time Calculation (min) 42 min   Equipment Utilized During Treatment Gait belt   Activity Tolerance Patient tolerated treatment well   Behavior During Therapy [facility] for tasks assessed/performed      Past Medical History:  Diagnosis Date  . Anemia   . Bowel obstruction ([place])   . GERD (gastroesophageal reflux disease)   . IBS (irritable bowel syndrome)    at age of 67  . Multiple sclerosis ([place])   . Sciatica 2009  . Vision abnormalities     Past Surgical History:  Procedure Laterality Date  . ABDOMINAL HYSTERECTOMY  2008   cervix and right ovary still intact  . KNEE ARTHROSCOPY  2010 and 2011   Left knee, x2    There were no vitals filed for this visit.      Subjective Assessment - 07/24/16 1238    Subjective Reports being tired today and "feeling rough". Was up all night due to pain. See's Dr. Felecia Shelling tomorrow about her back and bil hand pain. No falls to report.    Limitations House hold activities;Walking;Sitting   How long can you stand comfortably? 5 mins    How long can you walk comfortably? about 5 mins   Currently in Pain? Yes   Pain Score 10-Worst pain ever   Pain Location Hand  back and both hands   Pain Orientation Right;Left   Pain Descriptors / Indicators Numbness;Tingling;Pins and needles   Pain Type Acute pain   Pain Onset More than a month ago    Pain Frequency Intermittent   Aggravating Factors  "anything"   Pain Relieving Factors Tramadol some with headaches, nothing for other pain   Multiple Pain Sites Yes   Pain Score 10   Pain Location Back   Pain Orientation Lower;Mid   Pain Descriptors / Indicators Aching;Pins and needles   Pain Onset 1 to 4 weeks ago   Pain Frequency Intermittent   Aggravating Factors  increased activity- standing/sitting for prolonged times   Pain Relieving Factors exercises             [facility] Adult PT Treatment/Exercise - 07/24/16 1244      Neuro Re-ed    Neuro Re-ed Details  ant/posterior direction on balance board, no UE support: EO rocking board with emphasis on tall posture, progressing to holding board steady with EC for 30 sec's x 3 reps, progressing to holding board steady with EC for head movements left<>right and up<>down x 10 reps. min guard assist to min assist for balance with cues on posture and weight shifting for balance assistance.     Lumbar Exercises: Stretches   Single Knee to Chest Stretch 2 reps;20 seconds;Limitations   Single Knee to Chest Stretch Limitations 2 reps each side, cues for hold times   Lower Trunk Rotation 2 reps;20 seconds;Limitations  Lower Trunk Rotation Limitations cues for hold times, 2 reps each way     Lumbar Exercises: Seated   Long Arc Quad on [place] AROM;Strengthening;Both;1 set;10 reps;Limitations   Hip Flexion on Ball AROM;Strengthening;Both;10 reps;Limitations   Other Seated Lumbar Exercises seated on pball: bouncing x 1 minute with emphasis on abd bracing/tall posture; pelvic rocks ant/posterior and laterally x 10 each way; pelvic circles x 10 reps each way; combo UE/LE contralateral sides raises x 10 each side. cues on posture and ex from with all of these.                          Lumbar Exercises: Supine   Ab Set 10 reps;5 seconds;Limitations   AB Set Limitations posterior pelvic tilt for 5 sec's x 10 reps with cues for abdominal bracing    Clam 10 reps;5 seconds;Limitations   Clam Limitations with red band around legs just above knees: pulling apart for 5 second hold and then slowly brining legs back together.  cues for slow, controlled motions, abdominal bracing and hold times.    Bent Knee Raise 10 reps;Limitations   Bent Knee Raise Limitations cues to maintain abdominal bracing with movements of marching   Bridge 10 reps;Limitations   Bridge Limitations cues for abdominal bracing with ex            PT Short Term Goals - 07/03/16 1122      PT SHORT TERM GOAL #1   Title =LTGs           PT Long Term Goals - 07/03/16 1122      PT LONG TERM GOAL #1   Title Pt will be independent with HEP in order to indicate improved functional mobility and decreased fall risk.  (Target Date: 08/02/16)   Status New     PT LONG TERM GOAL #2   Title Pt will improve FGA to >/=25/30 in order to indicate decreased fall risk.     Status New     PT LONG TERM GOAL #3   Title Pt will verbalize return to community fitness in order to maintain gains made in therapy.     Status New     PT LONG TERM GOAL #4   Title Pt will ambulate >1000' over varying outdoor surfaces without AD at mod I level in order to indicate safe return to community and leisure activity.     Status New     PT LONG TERM GOAL #5   Title Will assess 6MWT and imrove distance by 150' in order to indicate improved functional endurance.     Status New            Plan - 07/24/16 1242    Clinical Impression Statement today's skilled dPT session addressed core strengthening for decreased back pain and standing balance on complaint surfaces. Pt did report decreased pain after stretches on mat, no increase with other activities. Pt is progressing toward goals and should benefit from continued PT to progress toward unmet goals.    Rehab Potential Good   Clinical Impairments Affecting Rehab Potential Dx of MS since eval-   PT Frequency 2x / week   PT Duration 4 weeks    PT Treatment/Interventions ADLs/Self Care Home Management;Gait training;Stair training;Functional mobility training;Therapeutic activities;Therapeutic exercise;Balance training;Neuromuscular re-education;Patient/family education;Energy conservation;Vestibular   PT Next Visit Plan progress low back/core exercises.  Continue to work compliant surfaces for high level balance; LTGs due next week (by 08/02/16)   Consulted  and Agree with Plan of Care Patient      Patient will benefit from skilled therapeutic intervention in order to improve the following deficits and impairments:  Abnormal gait, Decreased activity tolerance, Decreased balance, Decreased endurance, Decreased mobility, Decreased strength, Impaired perceived functional ability, Impaired sensation, Pain (will address pain indirectly through exercise)  Visit Diagnosis: Unsteadiness on feet  Muscle weakness (generalized)  Acute midline low back pain without sciatica  Other abnormalities of gait and mobility     Problem List Patient Active Problem List   Diagnosis Date Noted  . Genital symptoms, female 07/13/2016  . High risk medication use 07/04/2016  . Multiple sclerosis ([employer]) 06/19/2016  . Neuropathic pain   . Neurologic gait disorder   . Neurogenic bowel   . Neurogenic bladder   . Anemia of chronic disease   . Leukocytosis   . Steroid-induced hyperglycemia   . Transverse myelitis ([place]) 06/14/2016  . Allergic rhinitis 04/12/2016  . Medication overuse headache 03/17/2016  . Elevated blood pressure reading in office without diagnosis of hypertension 03/13/2016  . Otitis media 02/23/2014  . Sciatica associated with disorder of lumbosacral spine 01/15/2014  . Edema 01/15/2014  . Headache(784.0) 07/21/2013  . GERD (gastroesophageal reflux disease) 07/21/2013  . RBBB on EKG 08/01/2012  . IBS (irritable bowel syndrome) 06/19/2012  . Healthcare maintenance 06/19/2012    Willow Ora, PTA, [facility] [address], [place], [place] [address] [phone] 07/25/16, 3:39 PM   Name: Tamara [name] MRN: [id number] Date of Birth: [birth year]

## 2016-07-26 ENCOUNTER — Telehealth: Payer: Self-pay | Admitting: Physical Therapy

## 2016-07-26 ENCOUNTER — Encounter: Payer: Self-pay | Admitting: Physical Therapy

## 2016-07-26 ENCOUNTER — Ambulatory Visit: Payer: BLUE CROSS/BLUE SHIELD | Admitting: Physical Therapy

## 2016-07-26 ENCOUNTER — Ambulatory Visit: Payer: BLUE CROSS/BLUE SHIELD | Admitting: Occupational Therapy

## 2016-07-26 DIAGNOSIS — M545 Low back pain, unspecified: Secondary | ICD-10-CM

## 2016-07-26 DIAGNOSIS — R208 Other disturbances of skin sensation: Secondary | ICD-10-CM | POA: Diagnosis not present

## 2016-07-26 DIAGNOSIS — R278 Other lack of coordination: Secondary | ICD-10-CM | POA: Diagnosis not present

## 2016-07-26 DIAGNOSIS — R2689 Other abnormalities of gait and mobility: Secondary | ICD-10-CM | POA: Diagnosis not present

## 2016-07-26 DIAGNOSIS — M6281 Muscle weakness (generalized): Secondary | ICD-10-CM

## 2016-07-26 DIAGNOSIS — R2681 Unsteadiness on feet: Secondary | ICD-10-CM | POA: Diagnosis not present

## 2016-07-26 NOTE — Therapy (Signed)
IADL activities.    Status Achieved     OT LONG TERM GOAL #5   Title Pt will demonstrate improved coordination as evidenced by decreasing time on 9 hole peg test in total by at least 6 seconds to assist with functional tasks (R= 30.83, L= 33.41)   Status On-going     OT LONG TERM GOAL #6   Title Pt will vebalize understanding of desensitization strategies for B hands.    Status Achieved     OT LONG TERM GOAL #7   Title Pt will demonstate understanding of MS literature provided for website and local MS chapter.    Status Achieved     OT LONG TERM GOAL #8   Title Pt will be mod I with wear and care of splints PRN (pt reports that her hands feels like they curl up if she doesn't keep moving them - worse at night- may splint)   Status On-going               Plan - 07/26/16 1137    Clinical Impression Statement Pt is progressing towards goals. She repors MD has  called in a new medication for her bilateral hand pain.  Pt continues to use both hands functionally despite significant  hand pain   Rehab Potential Good   OT Frequency 2x / week   OT Duration 4 weeks   OT Treatment/Interventions Self-care/ADL training;Moist Heat;Contrast Bath;Fluidtherapy;Parrafin;Ultrasound;Therapeutic exercise;Neuromuscular education;Energy conservation;DME and/or AE instruction;Splinting;Therapeutic activities;Patient/family education   Plan continue to work towards goals, address bilateral grip strength and functional use of hands as able   Consulted and Agree with Plan of Care Patient      Patient will benefit from skilled therapeutic intervention in order to improve the following deficits and impairments:  Decreased activity tolerance, Decreased coordination, Decreased knowledge of use of DME, Decreased strength, Impaired UE functional use, Impaired sensation, Pain  Visit Diagnosis: Muscle weakness (generalized)  Other abnormalities of gait and mobility  Other disturbances of skin sensation  Other lack of coordination    Problem List Patient Active Problem List   Diagnosis Date Noted  . Genital symptoms, female 07/13/2016  . High risk medication use 07/04/2016  . Multiple sclerosis ([place]) 06/19/2016  . Neuropathic pain   . Neurologic gait disorder   . Neurogenic bowel   . Neurogenic bladder   . Anemia of chronic disease   . Leukocytosis   . Steroid-induced hyperglycemia   . Transverse myelitis ([place]) 06/14/2016  . Allergic rhinitis 04/12/2016  . Medication overuse headache 03/17/2016  . Elevated blood pressure reading in office without diagnosis of hypertension 03/13/2016  . Otitis media 02/23/2014  . Sciatica associated with disorder of lumbosacral spine 01/15/2014  . Edema 01/15/2014  . Headache(784.0) 07/21/2013  . GERD (gastroesophageal reflux disease) 07/21/2013  . RBBB on EKG 08/01/2012  . IBS (irritable bowel syndrome) 06/19/2012  .  Healthcare maintenance 06/19/2012    Shenequa Howse 07/26/2016, 11:40 AM  [facility] [address] [place], [place], [address] Phone: [phone]   Fax:  [phone]  Name: Kaitlan Bin MRN: [id number] Date of Birth: 1961/01/12  [facility] [address] [place], [place], [address] Phone: [phone]   Fax:  [phone]  Occupational Therapy Treatment  Patient Details  Name: Estefani Bateson MRN: [id number] Date of Birth: [birth year] Referring Provider: Dr. Letta Pate  Encounter Date: 07/26/2016      OT End of Session - 07/26/16 1137    Visit Number 5   Number of Visits 8   Date for OT Re-Evaluation 07/31/16   Authorization Type BCBS - 30 visit limit for OT   OT Start Time 1105   OT Stop Time 1145   OT Time Calculation (min) 40 min      Past Medical History:  Diagnosis Date  . Anemia   . Bowel obstruction ([place])   . GERD (gastroesophageal reflux disease)   . IBS (irritable bowel syndrome)    at age of 86  . Multiple sclerosis ([place])   . Sciatica 2009  . Vision abnormalities     Past Surgical History:  Procedure Laterality Date  . ABDOMINAL HYSTERECTOMY  2008   cervix and right ovary still intact  . KNEE ARTHROSCOPY  2010 and 2011   Left knee, x2    There were no vitals filed for this visit.      Subjective Assessment - 07/26/16 1104    Subjective  Pt reports hand pain   Pertinent History see epic - newly diagnosed with MS   Patient Stated Goals to use my hands better   Currently in Pain? Yes   Pain Score 10-Worst pain ever   Pain Location Hand   Pain Orientation Right;Left   Pain Descriptors / Indicators Numbness;Tingling;Pins and needles   Pain Type Acute pain   Pain Onset More than a month ago   Pain Frequency Intermittent   Aggravating Factors  anything   Pain Relieving Factors tramadol   Multiple Pain Sites No          Treatment: Pt practiced using buttonhook, pt returned demonstration following instruction. Pt reports she ties her own shoes. Pt was shown elastic laces and shoe buttons however he reports she prefers to tie shoes. Pulling small pegs form putty with right and left UE's for improved grip , pinch and  fine motor coordination, min v.c Copying small peg design using left and right UE's for increased fine motor coordination, min difficulty, v.c. To use vision to compensate for sensory changes. Removing pegs from pegboard with emphasis on in hand manipulation, min-mod difficulty/ drops, min v.c for technique.                         OT Long Term Goals - 07/17/16 1329      OT LONG TERM GOAL #1   Title Pt will be mod I with HEP for bilateral coordination, grip strength - 07/31/2016   Status On-going     OT LONG TERM GOAL #2   Title Pt will improve grip strength for RUE by at least 8 pounds, L grip by at least 5 pounds to assist with functional activities (R= 15, L = 20).    Status On-going     OT LONG TERM GOAL #3   Title Pt will demonstrate ability to use vision to compensate for sensory impairment when using hands during functional tasks.    Status On-going     OT LONG TERM GOAL #4   Title Pt will verbalize understanding of AE to ease buttoning, tying shoes, opening containers, etc for ADL and

## 2016-07-26 NOTE — Therapy (Signed)
[facility] [address] [place], [place], [address] Phone: [phone]   Fax:  [phone]  Physical Therapy Treatment  Patient Details  Name: Tamara [name] MRN: [id number] Date of Birth: 01-Nov-1960 Referring Provider: Alysia Penna, MD  Encounter Date: 07/26/2016      PT End of Session - 07/26/16 1144    Visit Number 5   Number of Visits 9   Date for PT Re-Evaluation 08/02/16   Authorization Type BCBS-30 visit limit for PT   Authorization - Visit Number 5   Authorization - Number of Visits 30   PT Start Time 1016   PT Stop Time 1057   PT Time Calculation (min) 41 min   Activity Tolerance Patient tolerated treatment well   Behavior During Therapy [facility] for tasks assessed/performed      Past Medical History:  Diagnosis Date  . Anemia   . Bowel obstruction ([place])   . GERD (gastroesophageal reflux disease)   . IBS (irritable bowel syndrome)    at age of 35  . Multiple sclerosis ([place])   . Sciatica 2009  . Vision abnormalities     Past Surgical History:  Procedure Laterality Date  . ABDOMINAL HYSTERECTOMY  2008   cervix and right ovary still intact  . KNEE ARTHROSCOPY  2010 and 2011   Left knee, x2    There were no vitals filed for this visit.      Subjective Assessment - 07/26/16 1107    Subjective Patient reports being in a lot of pain today and feeling tired. She stated that she is only sleeping 1-2 hrs a night due to her LBP and pain in her hands. She saw D. Sater yesterday and will be starting IV medications in the next week.    Currently in Pain? Yes   Pain Score 10-Worst pain ever   Pain Location Back   Pain Orientation Lower   Pain Descriptors / Indicators Stabbing   Pain Type Acute pain   Pain Frequency Constant   Effect of Pain on Daily Activities difficult to sleep comfortably    Multiple Pain Sites Yes   Pain Score 10   Pain Location Hand   Pain Orientation Right;Left   Pain  Descriptors / Indicators Shooting;Tingling;Pins and needles   Pain Type Acute pain   Pain Onset More than a month ago   Pain Frequency Constant   Aggravating Factors  everything    Pain Relieving Factors medication takes the edge off              [facility] Adult PT Treatment/Exercise - 07/26/16 1113      High Level Balance   High Level Balance Activities Tandem walking   High Level Balance Comments ~35ft at counter back and forth; cueing to look forward and keep correct posture, min gaurd     Neuro Re-ed    Neuro Re-ed Details  EO 3x30sec, head turns 10x up <--> down, right <--> left, and diagonals; EC 3x30sec, head turns 10x up<--> down, right <--> left, and diagonals; EO alternating forward steps 10x each side; split stance EO/EC 3x30sec; tandem stance EC 3x30sec  both sides  standing in corner on airex with chair in front for safety,      Exercises   Exercises Lumbar     Lumbar Exercises: Stretches   Single Knee to Chest Stretch 3 reps  15sec    Lower Trunk Rotation 3 reps  15sec   Lower Trunk Rotation Limitations 15 sec, cueing to hold stretch      Lumbar Exercises: Seated   Long Arc Quad on [place] AROM;Strengthening;Both;10 reps;Limitations   LAQ on [place] Limitations started with flexing the hip then extended leg    Hip Flexion on Ball AROM;Strengthening;Both;10 reps;Limitations   Other Seated Lumbar Exercises seated on pball 15sec cueing to engage core and keep correct posture; seated twists to both sides 10x with arms flexed 90 degree; seated side bending each side 10x with arm over head                                                                                    Lumbar Exercises: Supine   Ab Set 10 reps;5 seconds   AB Set Limitations pelvic tilt 10x with cues for core engagement and flattened back    Bent Knee Raise 10 reps   Bent Knee Raise Limitations cues to maintain abdominal bracing with movements of  marching   Bridge Non-compliant;10 reps;Limitations   Bridge Limitations cues for abdominal bracing with ex   Other Supine Lumbar Exercises alternating arm raised toward mat 10x; progressed to hand hand to opposite knee on each side 10x, unable to coordinate alternating pattern so completed 10x on one side before switching                                      PT Short Term Goals - 07/03/16 1122      PT SHORT TERM GOAL #1   Title =LTGs           PT Long Term Goals - 07/03/16 1122      PT LONG TERM GOAL #1   Title Pt will be independent with HEP in order to indicate improved functional mobility and decreased fall risk.  (Target Date: 08/02/16)   Status New     PT LONG TERM GOAL #2   Title Pt will improve FGA to >/=25/30 in order to indicate decreased fall risk.     Status New     PT LONG TERM GOAL #3   Title Pt will verbalize return to community fitness in order to maintain gains made in therapy.     Status New     PT LONG TERM GOAL #4   Title Pt will ambulate >1000' over varying outdoor surfaces without AD at mod I level in order to indicate safe return to community and leisure activity.     Status New     PT LONG TERM GOAL #5   Title Will assess 6MWT and imrove distance by 150' in order to indicate improved functional endurance.     Status New           Plan - 07/26/16 1145    Clinical Impression Statement Today's session involved  core stregthening to help reduce back pain and balance exercises to reduce fall risk. Patient reported decrease LBP at end of today's session (8/10) and tolerated the addition of new exercises. Pt is continuing to progress toward goals and would benefit from further PT to address resdiual concerns.                     PT Treatment/Interventions ADLs/Self Care Home Management;Gait training;Stair training;Functional mobility training;Therapeutic activities;Therapeutic exercise;Balance training;Neuromuscular re-education;Patient/family  education;Energy conservation;Vestibular   PT Next Visit Plan progress low back/core exercises.  Continue to work compliant surfaces for high level balance; LTGs due next week (by 08/02/16)   Consulted and Agree with Plan of Care Patient      Patient will benefit from skilled therapeutic intervention in order to improve the following deficits and impairments:  Abnormal gait, Decreased activity tolerance, Decreased balance, Decreased endurance, Decreased mobility, Decreased strength, Impaired perceived functional ability, Impaired sensation, Pain  Visit Diagnosis: Unsteadiness on feet  Muscle weakness (generalized)  Acute midline low back pain without sciatica  Other abnormalities of gait and mobility     Problem List Patient Active Problem List   Diagnosis Date Noted  . Genital symptoms, female 07/13/2016  . High risk medication use 07/04/2016  . Multiple sclerosis ([place]) 06/19/2016  . Neuropathic pain   . Neurologic gait disorder   . Neurogenic bowel   . Neurogenic bladder   . Anemia of chronic disease   . Leukocytosis   . Steroid-induced hyperglycemia   . Transverse myelitis ([place]) 06/14/2016  . Allergic rhinitis 04/12/2016  . Medication overuse headache 03/17/2016  . Elevated blood pressure reading in office without diagnosis of hypertension 03/13/2016  . Otitis media 02/23/2014  . Sciatica associated with disorder of lumbosacral spine 01/15/2014  . Edema 01/15/2014  . Headache(784.0) 07/21/2013  . GERD (gastroesophageal reflux disease) 07/21/2013  . RBBB on EKG 08/01/2012  . IBS (irritable bowel syndrome) 06/19/2012  . Healthcare maintenance 06/19/2012    Skip Estimable, SPTA 07/26/2016, 11:52 AM  [facility] [address] [place], [place], [address] Phone: [phone]   Fax:  [phone]  Name: Tamara [name] MRN: [id number] Date of Birth: 1960-02-12

## 2016-07-26 NOTE — Telephone Encounter (Signed)
Attempted to call patient about missed PT apt today and upcoming OT apt at 11:00am. No answer at home phone and no machine to leave message on. Attempted cell phone with no answer and full mailbox, so unable to leave message there as well.   Willow Ora, PTA, [facility] [address], [place], [place] [address] [phone] 07/26/16, 9:56 AM

## 2016-07-31 ENCOUNTER — Ambulatory Visit: Payer: BLUE CROSS/BLUE SHIELD | Attending: Physical Medicine & Rehabilitation | Admitting: Occupational Therapy

## 2016-07-31 ENCOUNTER — Other Ambulatory Visit: Payer: Self-pay | Admitting: Neurology

## 2016-07-31 ENCOUNTER — Ambulatory Visit: Payer: BLUE CROSS/BLUE SHIELD | Admitting: Physical Therapy

## 2016-08-01 ENCOUNTER — Encounter: Payer: Self-pay | Admitting: Internal Medicine

## 2016-08-03 ENCOUNTER — Ambulatory Visit: Payer: BLUE CROSS/BLUE SHIELD | Admitting: Rehabilitation

## 2016-08-03 ENCOUNTER — Telehealth: Payer: Self-pay | Admitting: Occupational Therapy

## 2016-08-03 ENCOUNTER — Ambulatory Visit: Payer: BLUE CROSS/BLUE SHIELD | Admitting: Occupational Therapy

## 2016-08-03 NOTE — Telephone Encounter (Signed)
Pt has been a no show for last 2 OT appts and last 2 PT appts.  Pt also missed therapy one time prior. Spoke to pt today who states she is on a new medication and having a lot of difficulty adjusting to it, not sleeping and feeling drowsy and run down. Discussed options and pt has elected to cancel current therapy appts and focus on medical management (pt supposed to start infusions soon). Pt will be placed on hold for PT and OT and pt will call and reschedule appts when she feels she can tolerate.  Pt has HEP for both PT and OT and pt encouraged to do as much as possible at home.  Pt is aware that she will need to make new appts.

## 2016-08-04 ENCOUNTER — Ambulatory Visit (INDEPENDENT_AMBULATORY_CARE_PROVIDER_SITE_OTHER): Payer: BLUE CROSS/BLUE SHIELD | Admitting: Specialist

## 2016-08-04 ENCOUNTER — Encounter (INDEPENDENT_AMBULATORY_CARE_PROVIDER_SITE_OTHER): Payer: Self-pay | Admitting: Specialist

## 2016-08-04 VITALS — BP 111/80 | HR 78 | Ht 63.5 in | Wt 221.0 lb

## 2016-08-04 DIAGNOSIS — G35 Multiple sclerosis: Secondary | ICD-10-CM | POA: Diagnosis not present

## 2016-08-04 DIAGNOSIS — M2241 Chondromalacia patellae, right knee: Secondary | ICD-10-CM | POA: Diagnosis not present

## 2016-08-04 DIAGNOSIS — M539 Dorsopathy, unspecified: Secondary | ICD-10-CM | POA: Diagnosis not present

## 2016-08-04 DIAGNOSIS — M5442 Lumbago with sciatica, left side: Secondary | ICD-10-CM

## 2016-08-04 DIAGNOSIS — M2242 Chondromalacia patellae, left knee: Secondary | ICD-10-CM

## 2016-08-04 DIAGNOSIS — M5387 Other specified dorsopathies, lumbosacral region: Secondary | ICD-10-CM

## 2016-08-04 MED ORDER — ACETAMINOPHEN 500 MG PO TABS: 500.0000 mg | ORAL_TABLET | Freq: Four times a day (QID) | ORAL | 0 refills | Status: DC | PRN

## 2016-08-04 MED ORDER — BACLOFEN 10 MG PO TABS: 10.0000 mg | ORAL_TABLET | Freq: Three times a day (TID) | ORAL | 0 refills | Status: DC

## 2016-08-04 NOTE — Patient Instructions (Signed)
Plan: Avoid frequent bending and stooping  No lifting greater than 10 lbs. May use ice or moist heat for pain. Weight loss is of benefit. Handicap license is approved. Also work on quad and lumbar core strengthening exercise. Tylenol arthritis strength 500-650 mg to be taken continuously 4 x per day. The main ways of treat osteoarthritis, that are found to be success. Weight loss helps to decrease pain. Exercise is important to maintaining cartilage and thickness and strengthening. NSAIDs like  Tylenol are meds decreasing the inflamation. Ice is okay  In afternoon and evening and hot shower in the am

## 2016-08-04 NOTE — Progress Notes (Addendum)
Office Visit Note   Patient: Tamara [name]           Date of Birth: [birth year]           MRN: [id number] Visit Date: 08/04/2016              Requested by: Ophelia Shoulder, MD No address on file PCP: [name], MD   Assessment & Plan: Visit Diagnoses:  1. Multiple sclerosis ([place])   2. Sciatica associated with disorder of lumbosacral spine   3. Left-sided low back pain with left-sided sciatica, unspecified chronicity   4. Chondromalacia patellae, left knee   5. Chondromalacia patellae, right knee     Plan: Avoid frequent bending and stooping  No lifting greater than 10 lbs. May use ice or moist heat for pain. Weight loss is of benefit. Handicap license is approved. Also work on quad and lumbar core strengthening exercise. Tylenol arthritis strength 500-650 mg to be taken continuously 4 x per day. The main ways of treat osteoarthritis, that are found to be success. Weight loss helps to decrease pain. Exercise is important to maintaining cartilage and thickness and strengthening. NSAIDs like  Tylenol are meds decreasing the inflamation. Ice is okay  In afternoon and evening and hot shower in the am   Follow-Up Instructions: No Follow-up on file.   Orders:  No orders of the defined types were placed in this encounter.  No orders of the defined types were placed in this encounter.     Procedures: No procedures performed   Clinical Data: No additional findings.   Subjective: Chief Complaint  Patient presents with  . Lower Back - Follow-up    56 year old female with history of low back pain and sciatica. More recently with a flair in her back in the early part of May 2018. She was seen in the ER and reports that she was told that she had MS all up and down her back. Prevously with numbness and paresthesias in the hands and the feet. She has had hospitalization for these MS flairs with steroid treatment. She went through an intensive PT program as an In patient at the  rehab area. She went to out patient neurology with Dr. Leonie Man. Reports further MS treatment is planned via IV every 28 days. Has buckling of the legs with problems with the legs that bring  Her to the point where she might fall. She has had attempts at the use of Gabapentin and lyrica which she does not tolerate. Normal bowel or bladder function.     Review of Systems  HENT: Positive for trouble swallowing. Negative for congestion, dental problem, ear discharge, facial swelling, hearing loss, nosebleeds and postnasal drip.   Eyes: Negative.   Respiratory: Positive for wheezing. Negative for chest tightness and shortness of breath.   Cardiovascular: Negative.  Negative for palpitations and leg swelling.  Gastrointestinal: Positive for nausea and vomiting. Negative for abdominal distention, abdominal pain, diarrhea and rectal pain.  Endocrine: Negative.   Genitourinary: Positive for difficulty urinating.  Musculoskeletal: Positive for arthralgias, back pain, gait problem, joint swelling, myalgias and neck pain.  Skin: Negative.   Allergic/Immunologic: Negative.   Neurological: Positive for weakness and numbness.  Hematological: Negative.   Psychiatric/Behavioral: Negative.      Objective: Vital Signs: BP 111/80 (BP Location: Left Arm, Patient Position: Sitting)   Pulse 78   Ht 5' 3.5" (1.613 m)   Wt 221 lb (100.2 kg)   BMI 38.53 kg/m   Physical  Exam  Constitutional: She is oriented to person, place, and time. She appears well-developed and well-nourished.  HENT:  Head: Normocephalic and atraumatic.  Eyes: EOM are normal. Pupils are equal, round, and reactive to light.  Neck: Normal range of motion. Neck supple.  Pulmonary/Chest: Effort normal and breath sounds normal.  Abdominal: Soft. Bowel sounds are normal.  Neurological: She is alert and oriented to person, place, and time.  Skin: Skin is warm and dry.  Psychiatric: She has a normal mood and affect. Her behavior is normal.  Judgment and thought content normal.    Right Knee Exam   Tenderness  The patient is experiencing tenderness in the patella and medial retinaculum.   Left Knee Exam   Tenderness  The patient is experiencing tenderness in the medial retinaculum and patellar tendon.   Back Exam   Tenderness  The patient is experiencing tenderness in the lumbar.  Range of Motion  Extension: normal  Flexion:  50 abnormal  Lateral Bend Right: normal  Lateral Bend Left: normal  Rotation Right: normal  Rotation Left: normal   Muscle Strength  Right Quadriceps:  5/5  Left Quadriceps:  5/5  Right Hamstrings:  5/5  Left Hamstrings:  5/5   Tests  Straight leg raise right: negative  Reflexes  Patellar: 3/4 Achilles: 3/4 Babinski's sign: normal   Other  Toe Walk: normal Heel Walk: abnormal Sensation: normal Gait: normal       Specialty Comments:  No specialty comments available.  Imaging: No results found.   PMFS History: Patient Active Problem List   Diagnosis Date Noted  . Genital symptoms, female 07/13/2016  . High risk medication use 07/04/2016  . Multiple sclerosis ([place]) 06/19/2016  . Neuropathic pain   . Neurologic gait disorder   . Neurogenic bowel   . Neurogenic bladder   . Anemia of chronic disease   . Leukocytosis   . Steroid-induced hyperglycemia   . Transverse myelitis ([place]) 06/14/2016  . Allergic rhinitis 04/12/2016  . Medication overuse headache 03/17/2016  . Elevated blood pressure reading in office without diagnosis of hypertension 03/13/2016  . Otitis media 02/23/2014  . Sciatica associated with disorder of lumbosacral spine 01/15/2014  . Edema 01/15/2014  . Headache(784.0) 07/21/2013  . GERD (gastroesophageal reflux disease) 07/21/2013  . RBBB on EKG 08/01/2012  . IBS (irritable bowel syndrome) 06/19/2012  . Healthcare maintenance 06/19/2012   Past Medical History:  Diagnosis Date  . Anemia   . Bowel obstruction ([place])   . GERD  (gastroesophageal reflux disease)   . IBS (irritable bowel syndrome)    at age of 60  . Multiple sclerosis ([place])   . Sciatica 2009  . Vision abnormalities     Family History  Problem Relation Age of Onset  . Cancer Father        Gallbladder  . Gallbladder disease Father   . Hypertension Brother   . Gallbladder disease Paternal Grandmother   . Colon cancer Neg Hx   . Colon polyps Neg Hx   . Esophageal cancer Neg Hx   . Kidney disease Neg Hx     Past Surgical History:  Procedure Laterality Date  . ABDOMINAL HYSTERECTOMY  2008   cervix and right ovary still intact  . KNEE ARTHROSCOPY  2010 and 2011   Left knee, x2   Social History   Occupational History  . Armed forces technical officer    Social History Main Topics  . Smoking status: Never Smoker  . Smokeless tobacco: Never Used  .  Alcohol use No  . Drug use: No  . Sexual activity: Not on file

## 2016-08-08 ENCOUNTER — Telehealth: Payer: Self-pay | Admitting: Neurology

## 2016-08-08 NOTE — Telephone Encounter (Signed)
Patient called office in reference to scheduling Tysabri.  States she hasn't heard anything.  Please call

## 2016-08-08 NOTE — Telephone Encounter (Signed)
Patient called office in reference to phentermine 30 MG capsule she would like to know if she can be increased to 37.5mg  states she has been on before and feels she would have better energy.  [place].  Please call

## 2016-08-09 MED ORDER — PHENTERMINE HCL 37.5 MG PO CAPS: 37.5000 mg | ORAL_CAPSULE | ORAL | 5 refills | Status: DC

## 2016-08-09 NOTE — Telephone Encounter (Signed)
Per RAS, ok to increase Phentermine to 37.5mg .  Rx. awaiting RAS sig/fim

## 2016-08-09 NOTE — Telephone Encounter (Signed)
Patient called office returning RN's call.  Please call °

## 2016-08-09 NOTE — Telephone Encounter (Signed)
Phone rings without being answered.  If pt. calls back, please let her know that I have given this message to Otila Kluver in the infusion suite so that she can call pt. with an update/fim

## 2016-08-09 NOTE — Telephone Encounter (Signed)
Message printed and given to Tina in the infusion suite/fim 

## 2016-08-09 NOTE — Addendum Note (Signed)
Addended by: France Ravens I on: 08/09/2016 08:40 AM   Modules accepted: Orders

## 2016-08-09 NOTE — Telephone Encounter (Signed)
Pt has not followed up with me since d/c your plan sounds ok.  She can have her Neurologist or PCP (depending on the med) refer her once her meds are straightened out

## 2016-08-09 NOTE — Telephone Encounter (Signed)
No answer at temporary # in task.  VM cell # is full./fim

## 2016-08-09 NOTE — Telephone Encounter (Signed)
Phone rings with no answer.  If she call's back, please let her know that a new prescription for Phentermine has been sent to her pharmacy/fim

## 2016-08-14 NOTE — Telephone Encounter (Signed)
Noted/fim 

## 2016-08-14 NOTE — Telephone Encounter (Signed)
Advised patient of previous message for Phentermine.

## 2016-08-16 ENCOUNTER — Other Ambulatory Visit (INDEPENDENT_AMBULATORY_CARE_PROVIDER_SITE_OTHER): Payer: Self-pay | Admitting: Specialist

## 2016-08-16 DIAGNOSIS — M5442 Lumbago with sciatica, left side: Secondary | ICD-10-CM

## 2016-08-16 DIAGNOSIS — M2242 Chondromalacia patellae, left knee: Secondary | ICD-10-CM

## 2016-08-16 DIAGNOSIS — M5387 Other specified dorsopathies, lumbosacral region: Secondary | ICD-10-CM

## 2016-08-16 DIAGNOSIS — G35 Multiple sclerosis: Secondary | ICD-10-CM

## 2016-08-16 NOTE — Telephone Encounter (Signed)
Baclofen refill request 

## 2016-08-22 ENCOUNTER — Encounter: Payer: BLUE CROSS/BLUE SHIELD | Attending: Physical Medicine & Rehabilitation

## 2016-08-22 ENCOUNTER — Ambulatory Visit (HOSPITAL_BASED_OUTPATIENT_CLINIC_OR_DEPARTMENT_OTHER): Payer: BLUE CROSS/BLUE SHIELD | Admitting: Physical Medicine & Rehabilitation

## 2016-08-22 VITALS — BP 121/83 | HR 76

## 2016-08-22 DIAGNOSIS — D649 Anemia, unspecified: Secondary | ICD-10-CM | POA: Diagnosis not present

## 2016-08-22 DIAGNOSIS — G35 Multiple sclerosis: Secondary | ICD-10-CM | POA: Diagnosis not present

## 2016-08-22 DIAGNOSIS — Z8249 Family history of ischemic heart disease and other diseases of the circulatory system: Secondary | ICD-10-CM | POA: Diagnosis not present

## 2016-08-22 DIAGNOSIS — Z8379 Family history of other diseases of the digestive system: Secondary | ICD-10-CM | POA: Diagnosis not present

## 2016-08-22 DIAGNOSIS — K219 Gastro-esophageal reflux disease without esophagitis: Secondary | ICD-10-CM | POA: Diagnosis not present

## 2016-08-22 DIAGNOSIS — K589 Irritable bowel syndrome without diarrhea: Secondary | ICD-10-CM | POA: Diagnosis not present

## 2016-08-22 DIAGNOSIS — R269 Unspecified abnormalities of gait and mobility: Secondary | ICD-10-CM | POA: Diagnosis not present

## 2016-08-22 NOTE — Patient Instructions (Signed)
You have 30 outpatient therapy visits.  I would recommend using only 1/2 of your visits to keep additional visits available if there is another MS exacerbation later this year

## 2016-08-22 NOTE — Progress Notes (Signed)
Subjective:    Patient ID: Tamara [name], female    DOB: [birth year], 56 y.o.   MRN: [id number] 56 year old right-handed female with history of sciatica, irritable bowel syndrome, chronic anemia, who lives with spouse, independent prior to admission.  Husband is disabled. Presented Jun 14, 2016, with progressive numbness involving trunk from the chest including lower extremities as well as numbness involving both hands.  MRI of the brain reviewed, showed demyelination per chart, 5 cm white matter lesion concerning for chronic demyelination, dominant 9 mm right parietal lesion.  MRI of cervical spine and lumbar spine showed acute 11 mm C3 demyelinating lesion, chronic 5 mm C5 demyelinating lesion.  CSF, VZV titers negative.  Placed on intravenous Solu-Medrol per [facility].  Suspect cervical myelitis versus multiple sclerosis.  Plan follow up outpatient with Dr. Arlice Colt of [facility]. DATE OF ADMISSION:  06/19/2016 DATE OF DISCHARGE:  06/28/2016  HPI Pt on hold from PT,OT, complaints of stinging and burning in both hands, uses gloves to touch items, Feels like metal object stick to her, showed me videos of copper and metal coins sticking to her finger.  Has seen Dr Louanne Skye for back Has seen Dr Felecia Shelling for MS and Dysesthesias,  Modified independent with all self-care and mobility. Does not use assistive device. Pain Inventory Average Pain 10 Pain Right Now 10 My pain is constant, sharp, burning, dull, stabbing, tingling and aching  In the last 24 hours, has pain interfered with the following? General activity 10 Relation with others 10 Enjoyment of life 10 What TIME of day is your pain at its worst? all Sleep (in general) Poor  Pain is worse with: walking, bending and sitting Pain improves with: medication Relief from Meds: 6  Mobility walk without assistance ability to climb steps?  no do you drive?  no  Function not employed: date last employed  .  Neuro/Psych bladder control problems bowel control problems weakness numbness tingling trouble walking spasms dizziness loss of taste or smell  Prior Studies Any changes since last visit?  no  Physicians involved in your care Any changes since last visit?  no   Family History  Problem Relation Age of Onset  . Cancer Father        Gallbladder  . Gallbladder disease Father   . Hypertension Brother   . Gallbladder disease Paternal Grandmother   . Colon cancer Neg Hx   . Colon polyps Neg Hx   . Esophageal cancer Neg Hx   . Kidney disease Neg Hx    Social History   Social History  . Marital status: Married    Spouse name: N/A  . Number of children: 2  . Years of education: N/A   Occupational History  . Armed forces technical officer    Social History Main Topics  . Smoking status: Never Smoker  . Smokeless tobacco: Never Used  . Alcohol use No  . Drug use: No  . Sexual activity: Not on file   Other Topics Concern  . Not on file   Social History Narrative  . No narrative on file   Past Surgical History:  Procedure Laterality Date  . ABDOMINAL HYSTERECTOMY  2008   cervix and right ovary still intact  . KNEE ARTHROSCOPY  2010 and 2011   Left knee, x2   Past Medical History:  Diagnosis Date  . Anemia   . Bowel obstruction ([place])   . GERD (gastroesophageal reflux disease)   . IBS (irritable bowel syndrome)    at  age of 17  . Multiple sclerosis ([place])   . Sciatica 2009  . Vision abnormalities    There were no vitals taken for this visit.  Opioid Risk Score:   Fall Risk Score:  `1  Depression screen PHQ 2/9  Depression screen [facility] 2/9 07/13/2016 06/12/2016 04/12/2016 03/27/2016 03/17/2016 03/13/2016 12/09/2015  Decreased Interest 0 0 0 0 0 0 0  Down, Depressed, Hopeless 1 - 0 0 0 0 0  PHQ - 2 Score 1 0 0 0 0 0 0     Review of Systems  Constitutional: Positive for diaphoresis and unexpected weight change.  HENT: Negative.   Eyes: Negative.   Respiratory:  Positive for shortness of breath.   Cardiovascular: Negative.   Gastrointestinal: Positive for constipation and diarrhea.  Endocrine: Negative.   Genitourinary: Negative.   Musculoskeletal: Negative.   Skin: Negative.   Allergic/Immunologic: Negative.   Neurological: Negative.   Hematological: Bruises/bleeds easily.  All other systems reviewed and are negative.      Objective:   Physical Exam  Constitutional: She is oriented to person, place, and time. She appears well-developed and well-nourished.  HENT:  Head: Normocephalic and atraumatic.  Eyes: Pupils are equal, round, and reactive to light. Conjunctivae and EOM are normal.  Neurological: She is alert and oriented to person, place, and time.  Motor strength is 5/5 bilateral deltoid, biceps, triceps, grip, hip flexor, knee extensor, ankle dorsal flexor.  Sensation intact to light touch bilateral upper and lower limbs. No evidence of hypersensitivity noted with upper extremity sensory testing.  Psychiatric: She has a normal mood and affect.  Nursing note and vitals reviewed.   Gait without evidence of toe drag or knee instability. She has a negative Romberg. She is able to heel walk, but has difficulty with toe walking. Also has difficulty with tandem gait     Assessment & Plan:  1.  MS exacerbation new C spine lesion From a functional standpoint doing very well, No physical medicine rehabilitation follow-up needed. pt follow up with orthopedic spine surgery Patient will follow-up with neurology Patient will follow-up with primary care   Cont OP PT for mild balance deficits, OT may finish up soon, given lack of upper extremity findings.

## 2016-08-23 ENCOUNTER — Encounter: Payer: Self-pay | Admitting: Physical Medicine & Rehabilitation

## 2016-08-24 DIAGNOSIS — G35 Multiple sclerosis: Secondary | ICD-10-CM | POA: Diagnosis not present

## 2016-08-28 ENCOUNTER — Other Ambulatory Visit (INDEPENDENT_AMBULATORY_CARE_PROVIDER_SITE_OTHER): Payer: Self-pay | Admitting: Specialist

## 2016-08-28 DIAGNOSIS — G35D Multiple sclerosis, unspecified: Secondary | ICD-10-CM

## 2016-08-28 DIAGNOSIS — M2242 Chondromalacia patellae, left knee: Secondary | ICD-10-CM

## 2016-08-28 DIAGNOSIS — G35 Multiple sclerosis: Secondary | ICD-10-CM

## 2016-08-28 DIAGNOSIS — M5442 Lumbago with sciatica, left side: Secondary | ICD-10-CM

## 2016-08-28 DIAGNOSIS — M5387 Other specified dorsopathies, lumbosacral region: Secondary | ICD-10-CM

## 2016-08-28 NOTE — Telephone Encounter (Signed)
Baclofen refill request 

## 2016-08-29 ENCOUNTER — Telehealth: Payer: Self-pay | Admitting: Neurology

## 2016-08-29 NOTE — Telephone Encounter (Signed)
Patient called office in reference to having left sided face tightness (going on and off for a week) but steady for the past 4 days.  Patient said she is having blurred vision on the left side hands and tip of toes are really really cold, and headaches.  Patient states she has a lot of electricity in her hands also.  Please call

## 2016-08-29 NOTE — Telephone Encounter (Signed)
I have spoken with pt. this afternoon.  She c/o intermittent blurry vision peripheral vision only, left eye, worse when she takes her glasses off, floaters, toes and fingers sometimes are cold, left sided of face intermittently tenses up (no paralysis and no difficulty closing eyelid). Denies double vision, denies eye pain. Per RAS, likely will not do anything differently for intermittent sx.  Hopefully, if these sx. are related to MS, they will improved after a few Tysabri infusions.  She verbalized understanding of same, is agreeable with monitoring sx. at this time and calling back if they persist or worsen, new sx. develop/fim

## 2016-08-31 ENCOUNTER — Other Ambulatory Visit: Payer: Self-pay | Admitting: *Deleted

## 2016-08-31 DIAGNOSIS — B9789 Other viral agents as the cause of diseases classified elsewhere: Principal | ICD-10-CM

## 2016-08-31 DIAGNOSIS — J069 Acute upper respiratory infection, unspecified: Secondary | ICD-10-CM

## 2016-08-31 MED ORDER — GUAIFENESIN-DM 100-10 MG/5ML PO SYRP: 5.0000 mL | ORAL_SOLUTION | ORAL | 0 refills | Status: DC | PRN

## 2016-09-01 ENCOUNTER — Ambulatory Visit (INDEPENDENT_AMBULATORY_CARE_PROVIDER_SITE_OTHER): Payer: BLUE CROSS/BLUE SHIELD | Admitting: Specialist

## 2016-09-01 ENCOUNTER — Ambulatory Visit: Payer: BLUE CROSS/BLUE SHIELD | Attending: Physical Medicine & Rehabilitation | Admitting: Physical Therapy

## 2016-09-07 ENCOUNTER — Other Ambulatory Visit (INDEPENDENT_AMBULATORY_CARE_PROVIDER_SITE_OTHER): Payer: Self-pay | Admitting: Specialist

## 2016-09-07 DIAGNOSIS — G35 Multiple sclerosis: Secondary | ICD-10-CM

## 2016-09-07 DIAGNOSIS — M5387 Other specified dorsopathies, lumbosacral region: Secondary | ICD-10-CM

## 2016-09-07 DIAGNOSIS — M2242 Chondromalacia patellae, left knee: Secondary | ICD-10-CM

## 2016-09-07 DIAGNOSIS — M5442 Lumbago with sciatica, left side: Secondary | ICD-10-CM

## 2016-09-07 NOTE — Telephone Encounter (Signed)
Baclofen refill request 

## 2016-09-11 ENCOUNTER — Other Ambulatory Visit (INDEPENDENT_AMBULATORY_CARE_PROVIDER_SITE_OTHER): Payer: Self-pay | Admitting: Specialist

## 2016-09-11 DIAGNOSIS — G35D Multiple sclerosis, unspecified: Secondary | ICD-10-CM

## 2016-09-11 DIAGNOSIS — G35 Multiple sclerosis: Secondary | ICD-10-CM

## 2016-09-11 DIAGNOSIS — M5442 Lumbago with sciatica, left side: Secondary | ICD-10-CM

## 2016-09-11 DIAGNOSIS — M5387 Other specified dorsopathies, lumbosacral region: Secondary | ICD-10-CM

## 2016-09-11 DIAGNOSIS — M2242 Chondromalacia patellae, left knee: Secondary | ICD-10-CM

## 2016-09-12 NOTE — Telephone Encounter (Signed)
Baclofen refill request 

## 2016-09-14 NOTE — Telephone Encounter (Signed)
Okay to Refill? 

## 2016-09-20 ENCOUNTER — Other Ambulatory Visit: Payer: Self-pay | Admitting: Neurology

## 2016-09-20 DIAGNOSIS — G35 Multiple sclerosis: Secondary | ICD-10-CM | POA: Diagnosis not present

## 2016-09-20 MED ORDER — IMIPRAMINE HCL 25 MG PO TABS: 50.0000 mg | ORAL_TABLET | Freq: Every day | ORAL | 5 refills | Status: DC

## 2016-09-28 ENCOUNTER — Telehealth: Payer: Self-pay | Admitting: Neurology

## 2016-09-28 NOTE — Telephone Encounter (Signed)
Pt called she ahd 2nd treatment of tysabri on 8/22. She began having a gradual swelling of the left foot. She is not adding any salt to her diet. Pt said she is also gaining weight but her diet hasn't changed. She is not able to exercise due to the swelling in the foot. She is not having any irritable bowel symptoms. Please call

## 2016-09-28 NOTE — Telephone Encounter (Signed)
I have spoken with Tamara [name] this afternoon and per RAS, advised unilateral foot swelling, slight wt. gain likely  not related to Tysabri.  She verbalized understanding of same/fim

## 2016-10-02 ENCOUNTER — Other Ambulatory Visit (INDEPENDENT_AMBULATORY_CARE_PROVIDER_SITE_OTHER): Payer: Self-pay | Admitting: Surgery

## 2016-10-02 DIAGNOSIS — G35 Multiple sclerosis: Secondary | ICD-10-CM

## 2016-10-02 DIAGNOSIS — M5442 Lumbago with sciatica, left side: Secondary | ICD-10-CM

## 2016-10-02 DIAGNOSIS — M5387 Other specified dorsopathies, lumbosacral region: Secondary | ICD-10-CM

## 2016-10-02 DIAGNOSIS — M2242 Chondromalacia patellae, left knee: Secondary | ICD-10-CM

## 2016-10-03 ENCOUNTER — Telehealth (INDEPENDENT_AMBULATORY_CARE_PROVIDER_SITE_OTHER): Payer: Self-pay

## 2016-10-03 NOTE — Telephone Encounter (Signed)
I called patient back and advised patient of the side effets of the medicine that Jeneen Rinks listed in other message and advised her to call her neurologist and have them to advise.  She states that the medicine really has helped her.

## 2016-10-03 NOTE — Telephone Encounter (Signed)
Patient called this morning stating she missed call from our office. Did not see where we had tried to reach her. She questioned if it was in regards to her refill request on baclofen please call her back [phone] to discuss

## 2016-10-03 NOTE — Telephone Encounter (Signed)
Please advise patient that these are the side effects of baclofen  Common baclofen side effects may include: drowsiness, dizziness, weakness, tired feeling; headache; sleep problems (insomnia); nausea, constipation; or. urinating more often than usual.  With history of MS I personally do not recommend continuing to prescribe this medication. She should speak with her neurologist and see if they would like to continue the medication or have patient discontinue.

## 2016-10-03 NOTE — Telephone Encounter (Signed)
Baclofen refill request 

## 2016-10-03 NOTE — Telephone Encounter (Signed)
Patient had called back.  I spoke with her and advised on Jeneen Rinks message.  She states that she will call her neurologist this afternoon and discuss with them.

## 2016-10-03 NOTE — Telephone Encounter (Signed)
I tried to call patient and there was no answer, I will try again later.

## 2016-10-06 NOTE — Telephone Encounter (Signed)
No refill.  Advised to speak with neurologist.

## 2016-10-11 ENCOUNTER — Encounter: Payer: Self-pay | Admitting: Neurology

## 2016-10-11 ENCOUNTER — Ambulatory Visit (INDEPENDENT_AMBULATORY_CARE_PROVIDER_SITE_OTHER): Payer: BLUE CROSS/BLUE SHIELD | Admitting: Neurology

## 2016-10-11 VITALS — BP 131/82 | HR 74 | Resp 18 | Ht 63.5 in | Wt 217.5 lb

## 2016-10-11 DIAGNOSIS — N319 Neuromuscular dysfunction of bladder, unspecified: Secondary | ICD-10-CM

## 2016-10-11 DIAGNOSIS — R208 Other disturbances of skin sensation: Secondary | ICD-10-CM

## 2016-10-11 DIAGNOSIS — Z79899 Other long term (current) drug therapy: Secondary | ICD-10-CM

## 2016-10-11 DIAGNOSIS — G373 Acute transverse myelitis in demyelinating disease of central nervous system: Secondary | ICD-10-CM

## 2016-10-11 DIAGNOSIS — R269 Unspecified abnormalities of gait and mobility: Secondary | ICD-10-CM | POA: Diagnosis not present

## 2016-10-11 DIAGNOSIS — G35 Multiple sclerosis: Secondary | ICD-10-CM

## 2016-10-11 MED ORDER — LAMOTRIGINE 100 MG PO TABS: 100.0000 mg | ORAL_TABLET | Freq: Two times a day (BID) | ORAL | 5 refills | Status: DC

## 2016-10-11 NOTE — Progress Notes (Signed)
[facility]  PATIENT: Tamara [name] DOB: [birth year]  REFERRING DOCTOR OR PCP:  Nadean Corwin SOURCE: Patient, notes from emergency room and recent hospital stay ([facility]), imaging and lab reports, MRI images on PACS.  _________________________________   HISTORICAL  CHIEF COMPLAINT:  Chief Complaint  Patient presents with  . Multiple Sclerosis    Sts. she is tolerating Tysabri well.  JCV ab last checked 07/04/16 and was Negative at 0.16.  Sts. she feels memory is better, and h/a's are improved. Sts. Dr.  Otelia Sergeant (ortho) d/c her Baclofen due to edema, h/a's, achy joints..  Still having electric jolt sensations in her fingertips/fim    HISTORY OF PRESENT ILLNESS:  Tamara [name] Is a 56 year old woman who was diagnosed with multiple sclerosis in May 2018 after she presented with transverse myelitis.   MS:   She started Tysabri therapy July 2018 and she tolerates it well. The JCV antibody was negative (0.16) on 07/04/2016.     Gait/strength:  Her gait and balance improved after the second course of  IV Steroids and she feels she is still doing well.  The truncal dysestheias and leg heaviness also improved,   She still notes electric sensations in both hands.   Her hands also seem mildly weak and clumsy.  Her hand dysesthesias are very uncomfortable and limit some of her activities.  She has trouble holding heavy pots.   Baclofen was stopped due to HA and swelling.    Numbness/dysesthesia:   Initially, she had a lot of numbness below the waist and into the groin. That has improved. She continues to have numbness in the hands and she gets an uncomfortable shocklike dysesthesias   Gabapentin and Lyrica were not tolerated in the past.    Tegretol caused itching and Trileptal caused mild swelling in her mouth?  Bladder/bowel:  Bladder function is doing well. She has some urinary frequency and urgency but is not having any incontinence recently.   Vision:  She denied any major change in  vision or diplopia.       Colors are normal and symmetric.     Fatigue/sleep:  She notes some fatigue, physical > cognitive.    The combination of being on Tysabri and the higher dose of phentermine have helped.   She is trying to exercise more.   The dysesthesias in her hands wake her up and sleep is poor many nights.    She notes fatigue and sleepiness are much better if she takes a nap, though then night time sleep is worse.  Mood/Cognition:  Depression and anxiety are better now that she is on a DMT and more accepting of the diagnosis.   Cognition is fine.  MS History:   On 06/10/16, she had the onset of numbness and clumsiness in her legs.   As the day went on, her hands also became numb and clumsy.  She felt numb from her waist down.    She went to the [facility] and had an xtay of her back and was referred to orthopedics     Two days later, she went to the ED when her symptoms worsened and she was seen and discharged.    She saw her internist who ordered a lumbar MRI.  The lumbar MRI showed degenerative changes that were mild at L4-L5 and the study was otherwise fairly normal.   She was referred to the ED and had an MRI of the brain, cervical spine and thoracic spine.  She was found to have a transverse myelitis in the cervical spine with an enhancing lesion in the posterior columns. There was also a smaller non-enhancing focus at C5   She also had 5-6 spots in her brain, the largest being periventricular on the right.  She was admitted to [facility] and had 3 days of IV Solumedrol followed by an oral taper.    She was transferred to rehabilitation and was discharged 5/30.   She walked out and felt much better.   Additionally, while she was in the hospital (06/15/2016) she had a lumbar puncture. The CSF was abnormal showing showing the presence  4 oligoclonal bands and an elevated IgG index of 0.9 (less than 0.7 is normal). The myelin basic protein was mildly elevated at 2.2.      He had  another exacerbation after returning home with left leg dragging and feeling more off balance with stumbling.   In retrospect, in 2017, she had an episode lasting a few weeks where she felt she was dragging her left leg some. This completely resolved and she did not have any other symptoms such as numbness at that time.   MRIs of the brain, cervical spine, thoracic spine and lumbar spine were personally reviewed. The MRI of the cervical spine shows an enhancing lesion posterior spinal cord adjacent to C2-C3. Additionally there is a nonenhancing focus adjacent to C5. In the brain, there are several T2/FLAIR hyperintense foci and the largest is in the right parietal lobe and there are 4 or 5 other small foci, one in the periventricular white matter of the right frontal lobe and the rest in the subcortical white matter.    Laboratory studies show 4 oligoclonal bands and an elevated IgG index of 0.9. Myelin basic protein was mildly elevated at 2.2. She had elevated glucose between 120 and 140 several times while getting steroids.   Her Vit D was mildly low (25.6) and she just started OTC supplementation.   She started Tysabri in July 2018   REVIEW OF SYSTEMS: Constitutional: No fevers, chills, sweats, or change in appetite Eyes: No visual changes, double vision, eye pain Ear, nose and throat: No hearing loss, ear pain, nasal congestion, sore throat Cardiovascular: No chest pain, palpitations Respiratory: No shortness of breath at rest or with exertion.   No wheezes GastrointestinaI: No nausea, vomiting, diarrhea, abdominal pain, fecal incontinence Genitourinary: No dysuria, urinary retention or frequency.  No nocturia. Musculoskeletal: No neck pain, back pain Integumentary: No rash, pruritus, skin lesions Neurological: as above Psychiatric: No depression at this time.  No anxiety Endocrine: No palpitations, diaphoresis, change in appetite, change in weigh or increased thirst Hematologic/Lymphatic: No  anemia, purpura, petechiae. Allergic/Immunologic: No itchy/runny eyes, nasal congestion, recent allergic reactions, rashes  ALLERGIES: Allergies  Allergen Reactions  . Codeine Other (See Comments)    Delusions  . Dilaudid [Hydromorphone Hcl] Swelling    Tongue swells   . Paxil [Paroxetine Hcl] Other (See Comments)    Hallucinations and heavy periods   . Tegretol [Carbamazepine] Itching  . Trileptal [Oxcarbazepine] Swelling  . Adhesive [Tape] Hives, Itching and Rash    PAPER TAPE=   . Augmentin [Amoxicillin-Pot Clavulanate] Rash  . Gabapentin Rash    Pt. sts. Gabapentin caused rash, hallucinations and depression    HOME MEDICATIONS:  Current Outpatient Prescriptions:  .  acetaminophen (TYLENOL) 500 MG tablet, Take 1 tablet (500 mg total) by mouth every 6 (six) hours as needed., Disp: 100 tablet, Rfl: 0 .  b  complex vitamins tablet, Take 1 tablet by mouth daily., Disp: , Rfl:  .  cetirizine (ZYRTEC ALLERGY) 10 MG tablet, Take 1 tablet (10 mg total) by mouth daily. (Patient taking differently: Take 10 mg by mouth daily as needed for allergies. ), Disp: 30 tablet, Rfl: 2 .  cholecalciferol (VITAMIN D) 400 UNITS TABS tablet, Take 400 Units by mouth daily. , Disp: , Rfl:  .  Collagen 500 MG CAPS, Take by mouth., Disp: , Rfl:  .  diclofenac sodium (VOLTAREN) 1 % GEL, Apply 2 g topically 3 (three) times daily., Disp: 1 Tube, Rfl: 0 .  docusate sodium (COLACE) 100 MG capsule, Take 1 capsule (100 mg total) by mouth daily., Disp: 10 capsule, Rfl: 0 .  fluticasone (FLONASE) 50 MCG/ACT nasal spray, Place 2 sprays into both nostrils daily. (Patient taking differently: Place 2 sprays into both nostrils daily as needed for allergies. ), Disp: 16 g, Rfl: 2 .  guaiFENesin-dextromethorphan (ROBITUSSIN DM) 100-10 MG/5ML syrup, Take 5 mLs by mouth every 4 (four) hours as needed for cough., Disp: 118 mL, Rfl: 0 .  imipramine (TOFRANIL) 25 MG tablet, Take 2 tablets (50 mg total) by mouth at bedtime.,  Disp: 60 tablet, Rfl: 5 .  Multiple Vitamin (MULTIVITAMIN) tablet, Take 1 tablet by mouth daily., Disp: , Rfl:  .  Multiple Vitamins-Minerals (HAIR SKIN AND NAILS FORMULA) TABS, Take 1 tablet by mouth daily., Disp: , Rfl:  .  natalizumab (TYSABRI) 300 MG/15ML injection, Inject 300 mg into the vein every 30 (thirty) days., Disp: , Rfl:  .  Omega-3 Fatty Acids (FISH OIL) 1000 MG CAPS, Take 1,000-2,000 mg by mouth See admin instructions. Take 2 capsules in the morning and take 1 capsule at bedtime , Disp: , Rfl:  .  pantoprazole (PROTONIX) 40 MG tablet, TAKE 1 TABLET BY MOUTH DAILY, Disp: 90 tablet, Rfl: 6 .  phentermine 37.5 MG capsule, Take 1 capsule (37.5 mg total) by mouth every morning., Disp: 30 capsule, Rfl: 5 .  traMADol (ULTRAM) 50 MG tablet, TAKE 1 TABLET BY MOUTH TWICE DAILY AS NEEDED FOR SEVERE PAIN, Disp: 60 tablet, Rfl: 0 .  vitamin E 100 UNIT capsule, Take 100 Units by mouth daily., Disp: , Rfl:  .  lamoTRIgine (LAMICTAL) 100 MG tablet, Take 1 tablet (100 mg total) by mouth 2 (two) times daily., Disp: 60 tablet, Rfl: 5  PAST MEDICAL HISTORY: Past Medical History:  Diagnosis Date  . Anemia   . Bowel obstruction ([facility])   . GERD (gastroesophageal reflux disease)   . IBS (irritable bowel syndrome)    at age of 81  . Multiple sclerosis ([facility])   . Sciatica 2009  . Vision abnormalities     PAST SURGICAL HISTORY: Past Surgical History:  Procedure Laterality Date  . ABDOMINAL HYSTERECTOMY  2008   cervix and right ovary still intact  . KNEE ARTHROSCOPY  2010 and 2011   Left knee, x2    FAMILY HISTORY: Family History  Problem Relation Age of Onset  . Cancer Father        Gallbladder  . Gallbladder disease Father   . Hypertension Brother   . Gallbladder disease Paternal Grandmother   . Colon cancer Neg Hx   . Colon polyps Neg Hx   . Esophageal cancer Neg Hx   . Kidney disease Neg Hx     SOCIAL HISTORY:  Social History   Social History  . Marital status: Married     Spouse name: N/A  . Number of children:  2  . Years of education: N/A   Occupational History  . Science writer    Social History Main Topics  . Smoking status: Never Smoker  . Smokeless tobacco: Never Used  . Alcohol use No  . Drug use: No  . Sexual activity: Not on file   Other Topics Concern  . Not on file   Social History Narrative  . No narrative on file     PHYSICAL EXAM  Vitals:   10/11/16 1036  BP: 131/82  Pulse: 74  Resp: 18  Weight: 217 lb 8 oz (98.7 kg)  Height: 5' 3.5" (1.613 m)    Body mass index is 37.92 kg/m.   General: The patient is well-developed and well-nourished and in no acute distress   Neurologic Exam  Mental status: The patient is alert and oriented x 3 at the time of the examination. The patient has apparent normal recent and remote memory, with an apparently normal attention span and concentration ability.   Speech is normal.  Cranial nerves: Extraocular movements are full. Facial strength and sensation is normal. Trapezius strength is normal.  No dysarthria is noted.  The tongue is midline, and the patient has symmetric elevation of the soft palate. No obvious hearing deficits are noted.  Motor:  Muscle bulk is normal.   Tone is normal. Strength is  5/5 in the arms and legs.   Sensory: She reports normal and symmetric sensation to touch and vibration in the arms and legs.   Coordination: Cerebellar testing reveals good finger-nose-finger and normal heel-to-shin bilaterally.  Gait and station: Station is normal.   Her gait is normal now though tandem is wide.   Romberg is negative now   Reflexes:  . Deep tendon reflexes are 3 and symmetric in the knees and ankles and 2 and symmetric in the arms.      DIAGNOSTIC DATA (LABS, IMAGING, TESTING) - I reviewed patient records, labs, notes, testing and imaging myself where available.  Lab Results  Component Value Date   WBC 7.3 07/09/2016   HGB 10.2 (L) 07/09/2016   HCT 32.0 (L)  07/09/2016   MCV 88.9 07/09/2016   PLT 190 07/09/2016      Component Value Date/Time   NA 143 07/13/2016 1215   K 4.2 07/13/2016 1215   CL 103 07/13/2016 1215   CO2 25 07/13/2016 1215   GLUCOSE 79 07/13/2016 1215   GLUCOSE 91 07/09/2016 2107   BUN 14 07/13/2016 1215   CREATININE 0.84 07/13/2016 1215   CREATININE 1.01 07/08/2013 1440   CALCIUM 8.7 07/13/2016 1215   PROT 5.1 (L) 06/20/2016 0545   ALBUMIN 2.7 (L) 06/20/2016 0545   ALBUMIN 4.2 06/15/2016 1203   AST 14 (L) 06/20/2016 0545   ALT 20 06/20/2016 0545   ALKPHOS 47 06/20/2016 0545   BILITOT 0.4 06/20/2016 0545   GFRNONAA 78 07/13/2016 1215   GFRNONAA 64 07/08/2013 1440   GFRAA 90 07/13/2016 1215   GFRAA 74 07/08/2013 1440   No results found for: CHOL, HDL, LDLCALC, LDLDIRECT, TRIG, CHOLHDL No results found for: WJXB1Y Lab Results  Component Value Date   VITAMINB12 2,209 (H) 06/15/2016   Lab Results  Component Value Date   TSH 0.351 06/15/2016       ASSESSMENT AND PLAN  Multiple sclerosis ([facility])  Transverse myelitis ([facility])  Neurologic gait disorder  Neurogenic bladder  High risk medication use  Dysesthesia    1.    Tysabri 300 mg for MS q 4 weeks.  2.    Continue imipramine for the nocturia and dysesthesias. Add lamotrigine and titrated up to 100 mg by mouth twice a day for dysesthesias..   3.    Stay active and exercise.     4.    RTC 4  months.   She is advised to call sooner if she has any new or worsening neurologic symptoms.    Iasha Mccalister A. Epimenio Foot, MD, PhD, [place]  10/11/2016, 11:04 AM Certified in Neurology, Clinical Neurophysiology, Sleep Medicine, Pain Medicine and Neuroimaging Dir., [facility]  [facility] [address], [address] [place], [place] [address] [phone]

## 2016-10-11 NOTE — Patient Instructions (Addendum)
The pharmacy has the prescription for lamotrigine 100 mg tablets. For 5 days, just take one half pill a day. For the next 5 days, take one half pill twice a day. For the next 5 days, take one half pill 3 times a day Then start taking one pill twice a day from this point on.    In the future, we may increase the dose further.  If you get a rash, need to stop the medication and not take it again. 

## 2016-10-13 ENCOUNTER — Other Ambulatory Visit: Payer: Self-pay | Admitting: Neurology

## 2016-10-18 DIAGNOSIS — G35 Multiple sclerosis: Secondary | ICD-10-CM | POA: Diagnosis not present

## 2016-10-20 ENCOUNTER — Encounter (INDEPENDENT_AMBULATORY_CARE_PROVIDER_SITE_OTHER): Payer: Self-pay | Admitting: Specialist

## 2016-10-20 ENCOUNTER — Ambulatory Visit (INDEPENDENT_AMBULATORY_CARE_PROVIDER_SITE_OTHER): Payer: BLUE CROSS/BLUE SHIELD | Admitting: Specialist

## 2016-10-20 VITALS — BP 113/74 | HR 69 | Ht 63.5 in | Wt 221.0 lb

## 2016-10-20 DIAGNOSIS — M2142 Flat foot [pes planus] (acquired), left foot: Secondary | ICD-10-CM

## 2016-10-20 DIAGNOSIS — M25561 Pain in right knee: Secondary | ICD-10-CM

## 2016-10-20 DIAGNOSIS — G373 Acute transverse myelitis in demyelinating disease of central nervous system: Secondary | ICD-10-CM

## 2016-10-20 DIAGNOSIS — G8929 Other chronic pain: Secondary | ICD-10-CM | POA: Diagnosis not present

## 2016-10-20 DIAGNOSIS — M539 Dorsopathy, unspecified: Secondary | ICD-10-CM | POA: Diagnosis not present

## 2016-10-20 DIAGNOSIS — M25562 Pain in left knee: Secondary | ICD-10-CM

## 2016-10-20 DIAGNOSIS — M5440 Lumbago with sciatica, unspecified side: Secondary | ICD-10-CM | POA: Diagnosis not present

## 2016-10-20 DIAGNOSIS — M542 Cervicalgia: Secondary | ICD-10-CM

## 2016-10-20 DIAGNOSIS — M2141 Flat foot [pes planus] (acquired), right foot: Secondary | ICD-10-CM

## 2016-10-20 DIAGNOSIS — G35 Multiple sclerosis: Secondary | ICD-10-CM

## 2016-10-20 DIAGNOSIS — M5387 Other specified dorsopathies, lumbosacral region: Secondary | ICD-10-CM

## 2016-10-20 NOTE — Patient Instructions (Addendum)
The main ways of treat osteoarthritis, that are found to be success. Weight loss helps to decrease pain. Exercise is important to maintaining cartilage and thickness and strengthening. NSAIDs like motrin, tylenol, alleve are meds decreasing the inflamation. Ice is okay  In afternoon and evening and hot shower in the am Avoid frequent bending and stooping  No lifting greater than 10 lbs. May use ice or moist heat for pain. Weight loss is of benefit. Handicap license is approved.Avoid overhead lifting and overhead use of the arms. Do not lift greater than 5 lbs. Adjust head rest in vehicle to prevent hyperextension if rear ended.  DME Rx for [employer] [facility] [employer] is written. Also reports that her orthotics for bilateral pes planus foot are worn out Rx for new orthotics by [facility] written.

## 2016-10-20 NOTE — Progress Notes (Signed)
Office Visit Note   Patient: Tamara [name]           Date of Birth: December 07, 1960           MRN: [id number] Visit Date: 10/20/2016              Requested by: [name], MD [address] [place], [place] [address] PCP: [name], MD   Assessment & Plan: Visit Diagnoses:  1. Chronic patellofemoral pain of both knees   2. Low back pain with sciatica, sciatica laterality unspecified, unspecified back pain laterality, unspecified chronicity   3. Cervicalgia   4. Bilateral pes planus     Plan: The main ways of treat osteoarthritis, that are found to be success. Weight loss helps to decrease pain. Exercise is important to maintaining cartilage and thickness and strengthening. NSAIDs like motrin, tylenol, alleve are meds decreasing the inflamation. Ice is okay  In afternoon and evening and hot shower in the am Avoid frequent bending and stooping  No lifting greater than 10 lbs. May use ice or moist heat for pain. Weight loss is of benefit. Handicap license is approved.Avoid overhead lifting and overhead use of the arms. Do not lift greater than 5 lbs. Adjust head rest in vehicle to prevent hyperextension if rear ended. DME Rx for [employer] is written. Also reports that her orthotics for bilateral pes planus foot are worn out Rx for new orthotics by [facility] written. Follow-Up Instructions: Return in about 3 months (around 01/19/2017).   Orders:  Orders Placed This Encounter  Procedures  . DME Other see comment   No orders of the defined types were placed in this encounter.     Procedures: No procedures performed   Clinical Data: No additional findings.   Subjective: Chief Complaint  Patient presents with  . Lower Back - Follow-up  . Right Knee - Follow-up  . Left Knee - Follow-up    56 year old female with history of MS, possibly since 2008. Diagnosed in May this year unable to stand or walk on Mother's Day. She relates that she was incapacitated  and she was hospitalized Underwent MRIs of the lumbar, thoracic and cervical spine and brain. She stopped baclofen due to reaction to this medication. She has had intermittant numbness and tingling into both arms and legs. Bowel and bladder is stable, takes colace and has constipation.    Review of Systems  Constitutional: Negative.   HENT: Negative.   Eyes: Negative.   Respiratory: Negative.   Cardiovascular: Negative.   Gastrointestinal: Negative.   Endocrine: Negative.   Genitourinary: Negative.   Musculoskeletal: Negative.   Skin: Negative.   Allergic/Immunologic: Negative.   Neurological: Negative.   Hematological: Negative.   Psychiatric/Behavioral: Negative.      Objective: Vital Signs: BP 113/74 (BP Location: Left Arm, Patient Position: Sitting)   Pulse 69   Ht 5' 3.5" (1.613 m)   Wt 221 lb (100.2 kg)   BMI 38.53 kg/m   Physical Exam  Constitutional: She is oriented to person, place, and time. She appears well-developed and well-nourished.  HENT:  Head: Normocephalic and atraumatic.  Eyes: Pupils are equal, round, and reactive to light. EOM are normal.  Neck: Normal range of motion. Neck supple.  Pulmonary/Chest: Effort normal and breath sounds normal.  Abdominal: Soft. Bowel sounds are normal.  Musculoskeletal: Normal range of motion.  Neurological: She is alert and oriented to person, place, and time.  Skin: Skin is warm and dry.  Psychiatric: She has a normal mood and affect. Her behavior is normal. Judgment and thought content normal.    Right Ankle Exam  Right ankle exam is normal.  Comments:  Pes planus with 4 degrees valgus at the heel, with standing on toes there is restoration of arch   Left Ankle Exam  Left ankle exam is normal.  Comments:  Pes planus with 6 degrees valgus at the heel, with standing on toes there is restoration of arch   Right Knee Exam   Tenderness  The patient is experiencing tenderness in the patellar tendon and  patella.  Range of Motion  Extension: 10  Flexion: 140   Muscle Strength   The patient has normal right knee strength.  Tests  Lachman:  Anterior - negative    Posterior - negative Drawer:       Anterior - negative    Posterior - negative Pivot Shift: negative Patellar Apprehension: positive  Other  Scars: absent Sensation: normal Pulse: present Swelling: none   Left Knee Exam   Tenderness  The patient is experiencing tenderness in the patellar tendon and patella.  Range of Motion  Extension: 10  Flexion: 140   Muscle Strength   The patient has normal left knee strength.  Tests  Lachman:  Anterior - negative    Posterior - negative Drawer:       Anterior - negative      Pivot Shift: negative Patellar Apprehension: positive  Other  Erythema: absent Scars: absent Sensation: normal Pulse: present Swelling: none   Back Exam   Tenderness  The patient is experiencing tenderness in the lumbar.  Range of Motion  Extension: normal  Flexion: normal  Lateral Bend Right: normal  Rotation Right: normal  Rotation Left: normal   Muscle Strength  Right Quadriceps:  5/5  Left Quadriceps:  5/5  Right Hamstrings:  5/5  Left Hamstrings:  5/5   Tests  Straight leg raise right: negative Straight leg raise left: negative  Reflexes  Patellar: normal Achilles: normal  Other  Toe Walk: normal Heel Walk: normal Sensation: normal Gait: normal  Erythema: no back redness Scars: absent      Specialty Comments:  No specialty comments available.  Imaging: No results found.   PMFS History: Patient Active Problem List   Diagnosis Date Noted  . Dysesthesia 10/11/2016  . Genital symptoms, female 07/13/2016  . High risk medication use 07/04/2016  . Multiple sclerosis ([place]) 06/19/2016  . Neuropathic pain   . Neurologic gait disorder   . Neurogenic bowel   . Neurogenic bladder   . Anemia of chronic disease   . Leukocytosis   . Steroid-induced  hyperglycemia   . Transverse myelitis ([place]) 06/14/2016  . Allergic rhinitis 04/12/2016  . Medication overuse headache 03/17/2016  . Elevated blood pressure reading in office without diagnosis of hypertension 03/13/2016  . Otitis media 02/23/2014  . Sciatica associated with disorder of lumbosacral spine 01/15/2014  . Edema 01/15/2014  . Headache(784.0) 07/21/2013  . GERD (gastroesophageal reflux disease) 07/21/2013  . RBBB on EKG 08/01/2012  . IBS (irritable bowel syndrome) 06/19/2012  . Healthcare maintenance 06/19/2012   Past Medical History:  Diagnosis Date  . Anemia   . Bowel obstruction ([place])   . GERD (gastroesophageal reflux disease)   . IBS (irritable bowel syndrome)    at age of 1  . Multiple sclerosis ([place])   . Sciatica 2009  . Vision abnormalities     Family History  Problem Relation Age of  Onset  . Cancer Father        Gallbladder  . Gallbladder disease Father   . Hypertension Brother   . Gallbladder disease Paternal Grandmother   . Colon cancer Neg Hx   . Colon polyps Neg Hx   . Esophageal cancer Neg Hx   . Kidney disease Neg Hx     Past Surgical History:  Procedure Laterality Date  . ABDOMINAL HYSTERECTOMY  2008   cervix and right ovary still intact  . KNEE ARTHROSCOPY  2010 and 2011   Left knee, x2   Social History   Occupational History  . Armed forces technical officer    Social History Main Topics  . Smoking status: Never Smoker  . Smokeless tobacco: Never Used  . Alcohol use No  . Drug use: No  . Sexual activity: Not on file

## 2016-10-24 DIAGNOSIS — M2141 Flat foot [pes planus] (acquired), right foot: Secondary | ICD-10-CM | POA: Diagnosis not present

## 2016-10-24 DIAGNOSIS — M2142 Flat foot [pes planus] (acquired), left foot: Secondary | ICD-10-CM | POA: Diagnosis not present

## 2016-10-26 ENCOUNTER — Encounter: Payer: Self-pay | Admitting: Occupational Therapy

## 2016-10-26 DIAGNOSIS — M6281 Muscle weakness (generalized): Secondary | ICD-10-CM

## 2016-10-26 NOTE — Therapy (Signed)
[facility] [address] [place], [place], [address] Phone: [phone]   Fax:  [phone]  Occupational Therapy Treatment  Patient Details  Name: Tamara [name] MRN: [id number] Date of Birth: 06-27-1960 Referring Provider: Dr. Letta Pate  Encounter Date: 10/26/2016    Past Medical History:  Diagnosis Date  . Anemia   . Bowel obstruction ([place])   . GERD (gastroesophageal reflux disease)   . IBS (irritable bowel syndrome)    at age of 71  . Multiple sclerosis ([place])   . Sciatica 2009  . Vision abnormalities     Past Surgical History:  Procedure Laterality Date  . ABDOMINAL HYSTERECTOMY  2008   cervix and right ovary still intact  . KNEE ARTHROSCOPY  2010 and 2011   Left knee, x2    There were no vitals filed for this visit.                                 OT Long Term Goals - 10/26/16 1448      OT LONG TERM GOAL #1   Title Pt will be mod I with HEP for bilateral coordination, grip strength - 07/31/2016   Status Unable to assess     OT LONG TERM GOAL #2   Title Pt will improve grip strength for RUE by at least 8 pounds, L grip by at least 5 pounds to assist with functional activities (R= 15, L = 20).    Status Unable to assess     OT LONG TERM GOAL #3   Title Pt will demonstrate ability to use vision to compensate for sensory impairment when using hands during functional tasks.    Status Unable to assess     OT LONG TERM GOAL #4   Title Pt will verbalize understanding of AE to ease buttoning, tying shoes, opening containers, etc for ADL and IADL activities.    Status Achieved     OT LONG TERM GOAL #5   Title Pt will demonstrate improved coordination as evidenced by decreasing time on 9 hole peg test in total by at least 6 seconds to assist with functional tasks (R= 30.83, L= 33.41)   Status Unable to assess     OT LONG TERM GOAL #6   Title Pt will vebalize understanding of  desensitization strategies for B hands.    Status Achieved     OT LONG TERM GOAL #7   Title Pt will demonstate understanding of MS literature provided for website and local MS chapter.    Status Achieved     OT LONG TERM GOAL #8   Title Pt will be mod I with wear and care of splints PRN (pt reports that her hands feels like they curl up if she doesn't keep moving them - worse at night- may splint)   Status Unable to assess               Plan - 10/26/16 1856    Clinical Impression Statement Pt did not return to therapy therefore will d/c from OT at this time.   OT Frequency 2x / week   OT Duration 4 weeks   OT Treatment/Interventions Self-care/ADL training;Moist Heat;Contrast Bath;Fluidtherapy;Parrafin;Ultrasound;Therapeutic exercise;Neuromuscular education;Energy conservation;DME and/or AE instruction;Splinting;Therapeutic activities;Patient/family education      Patient will benefit from skilled therapeutic intervention in order to improve the following deficits and impairments:  Decreased activity tolerance, Decreased coordination, Decreased knowledge of use of  DME, Decreased strength, Impaired UE functional use, Impaired sensation, Pain  Visit Diagnosis: Muscle weakness (generalized)    Problem List Patient Active Problem List   Diagnosis Date Noted  . Dysesthesia 10/11/2016  . Genital symptoms, female 07/13/2016  . High risk medication use 07/04/2016  . Multiple sclerosis ([place]) 06/19/2016  . Neuropathic pain   . Neurologic gait disorder   . Neurogenic bowel   . Neurogenic bladder   . Anemia of chronic disease   . Leukocytosis   . Steroid-induced hyperglycemia   . Transverse myelitis ([place]) 06/14/2016  . Allergic rhinitis 04/12/2016  . Medication overuse headache 03/17/2016  . Elevated blood pressure reading in office without diagnosis of hypertension 03/13/2016  . Otitis media 02/23/2014  . Sciatica associated with disorder of lumbosacral spine 01/15/2014  .  Edema 01/15/2014  . Headache(784.0) 07/21/2013  . GERD (gastroesophageal reflux disease) 07/21/2013  . RBBB on EKG 08/01/2012  . IBS (irritable bowel syndrome) 06/19/2012  . Healthcare maintenance 06/19/2012  OCCUPATIONAL THERAPY DISCHARGE SUMMARY  Visits from Start of Care: 4  Current functional level related to goals / functional outcomes: See above   Remaining deficits: See eval as pt only attended 3 tx sessions   Education / Equipment: HEP Plan: Patient agrees to discharge.  Patient goals were partially met. Patient is being discharged due to meeting the stated rehab goals.  ?????      Quay Burow, OTR/L 10/26/2016, 9:59 AM  [facility] [address] [place], [place], [address] Phone: [phone]   Fax:  [phone]  Name: Tamara [name] MRN: [id number] Date of Birth: 1960/10/24

## 2016-10-31 ENCOUNTER — Encounter: Payer: BLUE CROSS/BLUE SHIELD | Admitting: Internal Medicine

## 2016-10-31 NOTE — Progress Notes (Deleted)
-   flu vaccine (inactivated high dose for MS )  - pap smear today?  - Hep C screening,  - schedule mammogram & dexa   PMH multiple sclerosis with lesions in the brain and cervical spine neurogenic bladder and bowel, IBS, anemia of chronic disease, GERD, lumbosacral sciatica, and allergic rhinitis   Multiple sclerosis  Gait, strength, numbness, dysthesia, bladder and bowel function, vision and diplopia, fatigue - continue tysabri therapy through neurology  - imipramine and lamotrigine for nocturia an dysesthesias

## 2016-11-06 ENCOUNTER — Other Ambulatory Visit: Payer: Self-pay | Admitting: *Deleted

## 2016-11-06 DIAGNOSIS — B9789 Other viral agents as the cause of diseases classified elsewhere: Principal | ICD-10-CM

## 2016-11-06 DIAGNOSIS — J069 Acute upper respiratory infection, unspecified: Secondary | ICD-10-CM

## 2016-11-06 MED ORDER — FLUTICASONE PROPIONATE 50 MCG/ACT NA SUSP: 2.0000 | Freq: Every day | NASAL | 2 refills | Status: DC

## 2016-11-07 NOTE — Telephone Encounter (Signed)
Tamara [name] needs appt

## 2016-11-15 DIAGNOSIS — G35 Multiple sclerosis: Secondary | ICD-10-CM | POA: Diagnosis not present

## 2016-11-29 ENCOUNTER — Telehealth: Payer: Self-pay

## 2016-11-29 NOTE — Telephone Encounter (Signed)
Flu shot appt 11/1

## 2016-11-29 NOTE — Telephone Encounter (Signed)
Pt states she is having sore throat and want to speak with a nurse about flu shot. Please call pt back.

## 2016-11-30 ENCOUNTER — Encounter (INDEPENDENT_AMBULATORY_CARE_PROVIDER_SITE_OTHER): Payer: Self-pay

## 2016-11-30 ENCOUNTER — Ambulatory Visit (INDEPENDENT_AMBULATORY_CARE_PROVIDER_SITE_OTHER): Payer: BLUE CROSS/BLUE SHIELD | Admitting: *Deleted

## 2016-11-30 DIAGNOSIS — Z23 Encounter for immunization: Secondary | ICD-10-CM | POA: Diagnosis not present

## 2016-12-05 ENCOUNTER — Telehealth: Payer: Self-pay | Admitting: *Deleted

## 2016-12-05 NOTE — Telephone Encounter (Signed)
Thank you, I would prefer to have Tamara [name] come in for evaluation of this cough. The cough is non productive, she says that she is also having a sore throat but denies neck swelling or fever. She has tried over the counter medications but they've not worked to relieve her symptoms. I will refill the tussinex but have also encouraged her to schedule an appointment for exam. I am concerned because she is on immunosuppressive therapy for multiple sclerosis and is at risk for more severe infections.

## 2016-12-05 NOTE — Telephone Encounter (Signed)
Patient called c/o hacking cough since 11/30/16 after receiving flu shot. She was given Tussionex back in Feb & took it for 5 days. She had few teaspoons left so she tried it this time & seems to be the only med that works for her cough. She only have a teaspoon left & would like to know if MD can refill it. pls advise. Has appt w/ pcp 12/19/16.

## 2016-12-06 ENCOUNTER — Telehealth: Payer: Self-pay | Admitting: Internal Medicine

## 2016-12-06 NOTE — Telephone Encounter (Addendum)
   Reason for call:   I received a call from [name] at 6:00 PM requesting a refill of her Tussionex (hydrocodone + chlorpheniramine).    Pertinent Data:   Complaining of ongoing cough, congestion, and sore throat. Voice is hoarse over the phone. Denies fevers.   Assessment / Plan / Recommendations:   Informed patient that she needs to be evaluated in clinic for a new prescription. Instructed her to call the clinic tomorrow for an [facility] appointment.   As always, pt is advised that if symptoms worsen or new symptoms arise, they should go to an urgent care facility or to to ER for further evaluation.   Tamara Ochs, MD   12/06/2016, 6:10 PM

## 2016-12-07 ENCOUNTER — Other Ambulatory Visit: Payer: Self-pay

## 2016-12-07 ENCOUNTER — Ambulatory Visit (INDEPENDENT_AMBULATORY_CARE_PROVIDER_SITE_OTHER): Payer: BLUE CROSS/BLUE SHIELD | Admitting: Internal Medicine

## 2016-12-07 VITALS — BP 150/89 | HR 95 | Temp 97.7°F | Ht 63.5 in | Wt 222.4 lb

## 2016-12-07 DIAGNOSIS — B9789 Other viral agents as the cause of diseases classified elsewhere: Secondary | ICD-10-CM | POA: Diagnosis not present

## 2016-12-07 DIAGNOSIS — J069 Acute upper respiratory infection, unspecified: Secondary | ICD-10-CM

## 2016-12-07 MED ORDER — HYDROCOD POLST-CPM POLST ER 10-8 MG/5ML PO SUER: 5.0000 mL | Freq: Every evening | ORAL | 0 refills | Status: DC | PRN

## 2016-12-07 NOTE — Progress Notes (Signed)
   CC: Sore throat and cough  HPI:  Tamara [name] is a 56 y.o. female with a past medical history listed below here today with complaints of sore throat and cough.   Sore throat and cough x 4 days. Hoarseness x 2 days. History of MS on natalizumab. No fevers or chills. Scant clear productive cough. Does endorse rhinorrhea but denies any sinus congestion. No vision changes. No ear pain or hearing loss. No difficulties swallowing. No new body aches or muscle pains. Reports sick contact, cousin with cold like symptoms. Had flu shot earlier this year. Has been taking tussonex with some help. Unable to sleep at night due to coughing and waking her up at night.    Past Medical History:  Diagnosis Date  . Anemia   . Bowel obstruction ([place])   . GERD (gastroesophageal reflux disease)   . IBS (irritable bowel syndrome)    at age of 8  . Multiple sclerosis ([place])   . Sciatica 2009  . Vision abnormalities    Review of Systems:   Negative except as noted in HPI  Physical Exam:  Vitals:   12/07/16 1331  BP: (!) 150/89  Pulse: 95  Temp: 97.7 F (36.5 C)  TempSrc: Oral  SpO2: 100%  Weight: 222 lb 6.4 oz (100.9 kg)  Height: 5' 3.5" (1.613 m)   Physical Exam  Constitutional: She is well-developed, well-nourished, and in no distress. No distress.  HENT:  Head: Normocephalic and atraumatic.  Right Ear: External ear normal.  Left Ear: External ear normal.  Nose: Nose normal.  Mouth/Throat: Oropharynx is clear and moist. No oropharyngeal exudate.  Eyes: Conjunctivae are normal. Pupils are equal, round, and reactive to light.  Cardiovascular: Normal rate, regular rhythm and normal heart sounds.  Pulmonary/Chest: Effort normal and breath sounds normal. No respiratory distress. She has no wheezes. She has no rales.  Lymphadenopathy:    She has no cervical adenopathy.  Skin: Skin is warm and dry.  Psychiatric: Mood and affect normal.    Assessment & Plan:   See Encounters Tab for  problem based charting.  Patient discussed with Dr. Evette Doffing

## 2016-12-07 NOTE — Patient Instructions (Signed)
Ms. Macht,  I think you likely have a viral respiratory illness. We will work to treat the symptoms and they should go away on their own over the next several days.   If you are not getting any better by Monday, starting having fevers, starting having thick, yellow mucous production, have shortenss of breath please let us know,  BUFFERED ISOTONIC SALINE NASAL IRRIGATION  The Benefits:  1. When you irrigate, the isotonic saline (salt water) acts as a solvent and washes the mucus crusts and other debris from your nose.  2. This decongests and improves the airflow into your nose. The sinus passages begin to open.  3. Studies have also shown that a salt water and an alkaline (baking soda) irrigation solution improves nasal membrane cell function (mucociliary flow of mucus debris).  The Recipe:  1. Choose a 1-quart glass jar that is thoroughly cleansed.  2. Fill with sterile or distilled water, or you can boil water from the tap.  3. Add 1 to 2 heaping teaspoons of "pickling/canning/sea" salt (NOT table salt as it contains a large number of additives). This salt is available at the grocery store in the food canning section.  4. Add 1 teaspoon of Arm & Hammer Baking Soda (pure bicarbonate).  5. Mix ingredients together and store at room temperature. Discard after one week. If you find this solution too strong, you may decrease the amount of salt added to 1 to 1  teaspoons. With children it is often best to start with a milder solution and advance slowly. Irrigate with 240 ml (8 oz) twice daily.  The Instructions:  You should plan to irrigate your nose with buffered isotonic saline 2 times per day. Many people prefer to warm the solution slightly in the microwave - but be sure that the solution is NOT HOT. Stand over the sink (some do this in the shower) and squirt the solution into each side of your nose, keeping your mouth open. This allows you to spit the saltwater out of your mouth. It  will not harm you if you swallow a little.  If you have been told to use a nasal steroid such as Flonase, Nasonex, or Nasacort, you should always use isortonic saline solution first, then use your nasal steroid product. The nasal steroid is much more effective when sprayed onto clean nasal membranes and the steroid medicine will reach deeper into the nose.  Most people experience a little burning sensation the first few times they use a isotonic saline solution, but this usually goes away within a few days.

## 2016-12-08 DIAGNOSIS — J069 Acute upper respiratory infection, unspecified: Secondary | ICD-10-CM | POA: Insufficient documentation

## 2016-12-08 DIAGNOSIS — B9789 Other viral agents as the cause of diseases classified elsewhere: Principal | ICD-10-CM

## 2016-12-08 NOTE — Assessment & Plan Note (Signed)
Patient with viral URI with cough. Exam reassuring with clear lungs; no concern for PNA at this time. No evidence of MS flare today. Will treat conservatively with symptomatic management. Patient reports the only thing that has helped with her cough in the past has been Tussionex and requests a new prescription for this today. Will give Rx today. Return precautions given.

## 2016-12-11 NOTE — Progress Notes (Signed)
Internal Medicine Clinic Attending  Case discussed with Dr. Boswell at the time of the visit.  We reviewed the resident's history and exam and pertinent patient test results.  I agree with the assessment, diagnosis, and plan of care documented in the resident's note.  

## 2016-12-13 DIAGNOSIS — G35 Multiple sclerosis: Secondary | ICD-10-CM | POA: Diagnosis not present

## 2016-12-18 DIAGNOSIS — E669 Obesity, unspecified: Secondary | ICD-10-CM | POA: Insufficient documentation

## 2016-12-18 NOTE — Progress Notes (Signed)
   CC: concern for fatigue  HPI:  Tamara [name] is a 56 y.o. with PMH multiple sclerosis with lesions in the brain and cervical spine neurogenic bladder and bowel, IBS, anemia of chronic disease, GERD, lumbosacral sciatica, and allergic rhinitis who presents with acute concern of fatigue. Please see the assessment and plans for the status of the patient chronic medical problems.   Past Medical History:  Diagnosis Date  . Anemia   . Bowel obstruction ([place])   . GERD (gastroesophageal reflux disease)   . IBS (irritable bowel syndrome)    at age of 14  . Multiple sclerosis ([place])   . Sciatica 2009  . Vision abnormalities    Review of Systems: Refer to history of present illness and assessment and plans for pertinent review of systems, all others reviewed and negative  Physical Exam:  Vitals:   12/19/16 1442  BP: 124/76  Pulse: 77  Temp: 98.2 F (36.8 C)  TempSrc: Oral  SpO2: 100%  Weight: 224 lb 12.8 oz (102 kg)   General: well appearing, no acute distress  Cardiac: regular rate and rhythm, no murmurs appreciated  Pulmonary: clear to auscultation, no respiratory distress  Extremities: trace bilateral lower extremity pitting edema   Assessment & Plan:   Multiple sclerosis  Diagnosed on admission in may when she presented paresthesia of the legs and arms in May and was found white matter lesions in the brain and spinal cord concerning for demyelinating process. This was confirmed when CSF revealed elevated myelin basic protein twice the upper limit of normal. Strength and sensation improved slowly throughout the admission after she was started on high dose solu medrol. She has followed closely with neurology since this hospitalization and was started on tysabri, imipramine, and lamotrigine. Last week she received her 5th tysabri infusion. She usually feels unwell for the week following an infusion with decreased energy, joint pain, and brain fog. Now she reports a sensation of leg  weakness and difficulty getting out of bed first thing in the morning with generalized weakness and fatigue throughout the day. She also has numbness off and on in her hands. She notes that she was told she had a low protein level towards the end of her hospitalization and she strongly believes that this is the cause of her symptoms. We discussed that this may be related to the fluctuations in the symptoms that she will experience with MS. Both she and I will mention this to neurology. Tysabri is known to cause PML however she had negative JCV antibodies prior to starting this therapy. Of note, she did have albumin 2.7 at the time of last admission and she has peripheral edema today, urinalysis since that time was negative for protein.  - continue tysabri therapy through neurology  - imipramine and lamotrigine for nocturia and dysesthesias  - CMP today    Morbid obesity  BMP >35 with associated hypertension. Discussion about lifestyle modifications including diet and exercise started today.  - phentermine prescribed by neurology   Health maintenance:  - PCV 13, Hep C screening, and mammogram scheduled today  - Needs PPSV 23 no sooner than 2 months from now  - declined pap smear today, history of partial hysterectomy for menorrhagia from uterine fibroids   See Encounters Tab for problem based charting.  Patient discussed with Dr. Eppie Gibson

## 2016-12-19 ENCOUNTER — Encounter: Payer: Self-pay | Admitting: Internal Medicine

## 2016-12-19 ENCOUNTER — Other Ambulatory Visit: Payer: Self-pay

## 2016-12-19 ENCOUNTER — Ambulatory Visit (INDEPENDENT_AMBULATORY_CARE_PROVIDER_SITE_OTHER): Payer: BLUE CROSS/BLUE SHIELD | Admitting: Internal Medicine

## 2016-12-19 VITALS — BP 124/76 | HR 77 | Temp 98.2°F | Wt 224.8 lb

## 2016-12-19 DIAGNOSIS — D638 Anemia in other chronic diseases classified elsewhere: Secondary | ICD-10-CM

## 2016-12-19 DIAGNOSIS — Z79899 Other long term (current) drug therapy: Secondary | ICD-10-CM

## 2016-12-19 DIAGNOSIS — K589 Irritable bowel syndrome without diarrhea: Secondary | ICD-10-CM

## 2016-12-19 DIAGNOSIS — K592 Neurogenic bowel, not elsewhere classified: Secondary | ICD-10-CM

## 2016-12-19 DIAGNOSIS — J309 Allergic rhinitis, unspecified: Secondary | ICD-10-CM

## 2016-12-19 DIAGNOSIS — M5117 Intervertebral disc disorders with radiculopathy, lumbosacral region: Secondary | ICD-10-CM | POA: Diagnosis not present

## 2016-12-19 DIAGNOSIS — Z1239 Encounter for other screening for malignant neoplasm of breast: Secondary | ICD-10-CM

## 2016-12-19 DIAGNOSIS — Z23 Encounter for immunization: Secondary | ICD-10-CM | POA: Diagnosis not present

## 2016-12-19 DIAGNOSIS — Z90711 Acquired absence of uterus with remaining cervical stump: Secondary | ICD-10-CM | POA: Diagnosis not present

## 2016-12-19 DIAGNOSIS — N319 Neuromuscular dysfunction of bladder, unspecified: Secondary | ICD-10-CM | POA: Diagnosis not present

## 2016-12-19 DIAGNOSIS — Z1159 Encounter for screening for other viral diseases: Secondary | ICD-10-CM

## 2016-12-19 DIAGNOSIS — Z6839 Body mass index (BMI) 39.0-39.9, adult: Secondary | ICD-10-CM | POA: Diagnosis not present

## 2016-12-19 DIAGNOSIS — G35 Multiple sclerosis: Secondary | ICD-10-CM | POA: Diagnosis not present

## 2016-12-19 DIAGNOSIS — K219 Gastro-esophageal reflux disease without esophagitis: Secondary | ICD-10-CM | POA: Diagnosis not present

## 2016-12-19 DIAGNOSIS — Z780 Asymptomatic menopausal state: Secondary | ICD-10-CM

## 2016-12-19 DIAGNOSIS — I10 Essential (primary) hypertension: Secondary | ICD-10-CM | POA: Diagnosis not present

## 2016-12-19 NOTE — Assessment & Plan Note (Signed)
BMP >35 with associated hypertension. Discussion about lifestyle modifications including diet and exercise started today.  - phentermine prescribed by neurology

## 2016-12-19 NOTE — Patient Instructions (Signed)
It was a pleasure to see you today [name]  - For your weakness and decreased appetite, please let your neurologist know about these symptoms and let me know if you notice any new changes. Definitely call the office if you have a harder time with breathing   FOLLOW-UP INSTRUCTIONS When: 3 - 6 months  For: check up  What to bring: medication bottles     - Please call our clinic if you have any problems or questions, we may be able to help you and keep you from a long emergency room wait. Our clinic and after hours phone number is [phone]       Mediterranean Diet  Why follow it? Research shows. . Those who follow the Mediterranean diet have a reduced risk of heart disease  . The diet is associated with a reduced incidence of Parkinson's and Alzheimer's diseases . People following the diet may have longer life expectancies and lower rates of chronic diseases  . The Dietary Guidelines for Americans recommends the Mediterranean diet as an eating plan to promote health and prevent disease  What Is the Mediterranean Diet?  . Healthy eating plan based on typical foods and recipes of Mediterranean-style cooking . The diet is primarily a plant based diet; these foods should make up a majority of meals   Starches - Plant based foods should make up a majority of meals - They are an important sources of vitamins, minerals, energy, antioxidants, and fiber - Choose whole grains, foods high in fiber and minimally processed items  - Typical grain sources include wheat, oats, barley, corn, brown rice, bulgar, farro, millet, polenta, couscous  - Various types of beans include chickpeas, lentils, fava beans, black beans, white beans   Fruits  Veggies - Large quantities of antioxidant rich fruits & veggies; 6 or more servings  - Vegetables can be eaten raw or lightly drizzled with oil and cooked  - Vegetables common to the traditional Mediterranean Diet include: artichokes, arugula, beets, broccoli,  brussel sprouts, cabbage, carrots, celery, collard greens, cucumbers, eggplant, kale, leeks, lemons, lettuce, mushrooms, okra, onions, peas, peppers, potatoes, pumpkin, radishes, rutabaga, shallots, spinach, sweet potatoes, turnips, zucchini - Fruits common to the Mediterranean Diet include: apples, apricots, avocados, cherries, clementines, dates, figs, grapefruits, grapes, melons, nectarines, oranges, peaches, pears, pomegranates, strawberries, tangerines  Fats - Replace butter and margarine with healthy oils, such as olive oil, canola oil, and tahini  - Limit nuts to no more than a handful a day  - Nuts include walnuts, almonds, pecans, pistachios, pine nuts  - Limit or avoid candied, honey roasted or heavily salted nuts - Olives are central to the [employer] - can be eaten whole or used in a variety of dishes   Meats Protein - Limiting red meat: no more than a few times a month - When eating red meat: choose lean cuts and keep the portion to the size of deck of cards - Eggs: approx. 0 to 4 times a week  - Fish and lean poultry: at least 2 a week  - Healthy protein sources include, chicken, [place], lean beef, lamb - Increase intake of seafood such as tuna, salmon, trout, mackerel, shrimp, scallops - Avoid or limit high fat processed meats such as sausage and bacon  Dairy - Include moderate amounts of low fat dairy products  - Focus on healthy dairy such as fat free yogurt, skim milk, low or reduced fat cheese - Limit dairy products higher in fat such as whole  or 2% milk, cheese, ice cream  Alcohol - Moderate amounts of red wine is ok  - No more than 5 oz daily for women (all ages) and men older than age 68  - No more than 10 oz of wine daily for men younger than 102  Other - Limit sweets and other desserts  - Use herbs and spices instead of salt to flavor foods  - Herbs and spices common to the traditional Mediterranean Diet include: basil, bay leaves, chives, cloves, cumin, fennel,  garlic, lavender, marjoram, mint, oregano, parsley, pepper, rosemary, sage, savory, sumac, tarragon, thyme   It's not just a diet, it's a lifestyle:  . The Mediterranean diet includes lifestyle factors typical of those in the region  . Foods, drinks and meals are best eaten with others and savored . Daily physical activity is important for overall good health . This could be strenuous exercise like running and aerobics . This could also be more leisurely activities such as walking, housework, yard-work, or taking the stairs . Moderation is the key; a balanced and healthy diet accommodates most foods and drinks . Consider portion sizes and frequency of consumption of certain foods   Meal Ideas & Options:  . Breakfast:  o Whole wheat toast or whole wheat English muffins with peanut butter & hard boiled egg o Steel cut oats topped with apples & cinnamon and skim milk  o Fresh fruit: banana, strawberries, melon, berries, peaches  o Smoothies: strawberries, bananas, greek yogurt, peanut butter o Low fat greek yogurt with blueberries and granola  o Egg white omelet with spinach and mushrooms o Breakfast couscous: whole wheat couscous, apricots, skim milk, cranberries  . Sandwiches:  o Hummus and grilled vegetables (peppers, zucchini, squash) on whole wheat bread   o Grilled chicken on whole wheat pita with lettuce, tomatoes, cucumbers or tzatziki  o Tuna salad on whole wheat bread: tuna salad made with greek yogurt, olives, red peppers, capers, green onions o Garlic rosemary lamb pita: lamb sauted with garlic, rosemary, salt & pepper; add lettuce, cucumber, greek yogurt to pita - flavor with lemon juice and black pepper  . Seafood:  o Mediterranean grilled salmon, seasoned with garlic, basil, parsley, lemon juice and black pepper o Shrimp, lemon, and spinach whole-grain pasta salad made with low fat greek yogurt  o Seared scallops with lemon orzo  o Seared tuna steaks seasoned salt, pepper,  coriander topped with tomato mixture of olives, tomatoes, olive oil, minced garlic, parsley, green onions and cappers  . Meats:  o Herbed greek chicken salad with kalamata olives, cucumber, feta  o Red bell peppers stuffed with spinach, bulgur, lean ground beef (or lentils) & topped with feta   o Kebabs: skewers of chicken, tomatoes, onions, zucchini, squash  o [place] burgers: made with red onions, mint, dill, lemon juice, feta cheese topped with roasted red peppers . Vegetarian o Cucumber salad: cucumbers, artichoke hearts, celery, red onion, feta cheese, tossed in olive oil & lemon juice  o Hummus and whole grain pita points with a greek salad (lettuce, tomato, feta, olives, cucumbers, red onion) o Lentil soup with celery, carrots made with vegetable broth, garlic, salt and pepper  o Tabouli salad: parsley, bulgur, mint, scallions, cucumbers, tomato, radishes, lemon juice, olive oil, salt and pepper.

## 2016-12-19 NOTE — Assessment & Plan Note (Signed)
Diagnosed on admission in may when she presented paresthesia of the legs and arms in May and was found white matter lesions in the brain and spinal cord concerning for demyelinating process. This was confirmed when CSF revealed elevated myelin basic protein twice the upper limit of normal. Strength and sensation improved slowly throughout the admission after she was started on high dose solu medrol. She has followed closely with neurology since this hospitalization and was started on tysabri, imipramine, and lamotrigine. Last week she received her 5th tysabri infusion. She usually feels unwell for the week following an infusion with decreased energy, joint pain, and brain fog. Now she reports a sensation of leg weakness and difficulty getting out of bed first thing in the morning with generalized weakness and fatigue throughout the day. She also has numbness off and on in her hands. She notes that she was told she had a low protein level towards the end of her hospitalization and she strongly believes that this is the cause of her symptoms. We discussed that this may be related to the fluctuations in the symptoms that she will experience with MS. Both she and I will mention this to neurology. Tysabri is known to cause PML however she had negative JCV antibodies prior to starting this therapy. Of note, she did have albumin 2.7 at the time of last admission and she has peripheral edema today, urinalysis since that time was negative for protein.  - continue tysabri therapy through neurology  - imipramine and lamotrigine for nocturia and dysesthesias  - CMP today

## 2016-12-20 ENCOUNTER — Encounter: Payer: Self-pay | Admitting: Internal Medicine

## 2016-12-20 LAB — CMP14 + ANION GAP
ALT: 11 IU/L (ref 0–32)
AST: 16 IU/L (ref 0–40)
Albumin/Globulin Ratio: 2.2 (ref 1.2–2.2)
Albumin: 4.2 g/dL (ref 3.5–5.5)
Alkaline Phosphatase: 70 IU/L (ref 39–117)
Anion Gap: 12 mmol/L (ref 10.0–18.0)
BUN/Creatinine Ratio: 10 (ref 9–23)
BUN: 9 mg/dL (ref 6–24)
Bilirubin Total: <0.2 mg/dL (ref 0.0–1.2)
CO2: 27 mmol/L (ref 20–29)
Calcium: 9.5 mg/dL (ref 8.7–10.2)
Chloride: 104 mmol/L (ref 96–106)
Creatinine, Ser: 0.91 mg/dL (ref 0.57–1.00)
GFR calc Af Amer: 82 mL/min/{1.73_m2} (ref >59)
GFR calc non Af Amer: 71 mL/min/{1.73_m2} (ref >59)
Globulin, Total: 1.9 g/dL (ref 1.5–4.5)
Glucose: 78 mg/dL (ref 65–99)
Potassium: 5 mmol/L (ref 3.5–5.2)
Sodium: 143 mmol/L (ref 134–144)
Total Protein: 6.1 g/dL (ref 6.0–8.5)

## 2016-12-20 LAB — HEPATITIS C ANTIBODY: Hep C Virus Ab: <0.1 s/co ratio (ref 0.0–0.9)

## 2016-12-21 NOTE — Progress Notes (Signed)
Case discussed with Dr. Blum at the time of the visit. We reviewed the resident's history and exam and pertinent patient test results. I agree with the assessment, diagnosis, and plan of care documented in the resident's note. 

## 2016-12-24 ENCOUNTER — Other Ambulatory Visit: Payer: Self-pay | Admitting: Neurology

## 2017-01-12 DIAGNOSIS — G35 Multiple sclerosis: Secondary | ICD-10-CM | POA: Diagnosis not present

## 2017-01-19 ENCOUNTER — Other Ambulatory Visit (INDEPENDENT_AMBULATORY_CARE_PROVIDER_SITE_OTHER): Payer: Self-pay | Admitting: Specialist

## 2017-01-19 ENCOUNTER — Telehealth (INDEPENDENT_AMBULATORY_CARE_PROVIDER_SITE_OTHER): Payer: Self-pay | Admitting: Specialist

## 2017-01-19 ENCOUNTER — Ambulatory Visit (INDEPENDENT_AMBULATORY_CARE_PROVIDER_SITE_OTHER): Payer: BLUE CROSS/BLUE SHIELD | Admitting: Specialist

## 2017-01-19 NOTE — Progress Notes (Signed)
Rx written for an [employer]. This is a home exercise equipment to be used to perform maintenance exercise and therapy at home due to chronic fatique and muscle weakness and poor endurance due to  Multiple sclerosis.  Dx code is G35, G37.3  HPCP Code is A9300.

## 2017-01-19 NOTE — Telephone Encounter (Signed)
Patient has been rescheduled to January 14 @ 3:45.  Patient requested to be put on the cancellation list.

## 2017-01-19 NOTE — Telephone Encounter (Signed)
Put on cancellation list 

## 2017-01-24 ENCOUNTER — Ambulatory Visit: Payer: BLUE CROSS/BLUE SHIELD

## 2017-02-01 ENCOUNTER — Encounter: Payer: Self-pay | Admitting: *Deleted

## 2017-02-12 ENCOUNTER — Encounter (INDEPENDENT_AMBULATORY_CARE_PROVIDER_SITE_OTHER): Payer: Self-pay | Admitting: Specialist

## 2017-02-12 ENCOUNTER — Ambulatory Visit (INDEPENDENT_AMBULATORY_CARE_PROVIDER_SITE_OTHER): Payer: BLUE CROSS/BLUE SHIELD | Admitting: Specialist

## 2017-02-12 ENCOUNTER — Ambulatory Visit (INDEPENDENT_AMBULATORY_CARE_PROVIDER_SITE_OTHER): Payer: BLUE CROSS/BLUE SHIELD

## 2017-02-12 VITALS — BP 125/71 | HR 79 | Ht 63.5 in | Wt 217.0 lb

## 2017-02-12 DIAGNOSIS — M25561 Pain in right knee: Secondary | ICD-10-CM | POA: Diagnosis not present

## 2017-02-12 DIAGNOSIS — M222X1 Patellofemoral disorders, right knee: Secondary | ICD-10-CM

## 2017-02-12 DIAGNOSIS — G35 Multiple sclerosis: Secondary | ICD-10-CM | POA: Diagnosis not present

## 2017-02-12 DIAGNOSIS — M2391 Unspecified internal derangement of right knee: Secondary | ICD-10-CM

## 2017-02-12 NOTE — Progress Notes (Signed)
Office Visit Note   Patient: Tamara [name]           Date of Birth: 06-Dec-1960           MRN: [id number] Visit Date: 02/12/2017              Requested by: [name], MD [address] [place],  [address] PCP: [name], MD   Assessment & Plan: Visit Diagnoses:  1. Right knee pain, unspecified chronicity   2. Multiple sclerosis (HCC)   3. Patellofemoral pain syndrome of right knee   4. Knee ligamentous laxity, right     Plan:Knee is suffering from osteoarthritis, only real proven treatments are Weight loss, Tylenol ES arthritis strength or Equate 650 mg up to 3-4 times per day and exercise. Well padded shoes help. Ice the knee 2-3 times a day 15-20 mins at a time. May use over the counter transdermal antiinflamatory agents. Voltaren gel 1% 4 gm applied 3-4 x per day for arthritis pain. Capsaicin creame used  Twice a day.  Follow-Up Instructions: Return in about 4 weeks (around 03/12/2017).   Orders:  Orders Placed This Encounter  Procedures  . XR Knee 1-2 Views Right   No orders of the defined types were placed in this encounter.     Procedures: No procedures performed   Clinical Data: No additional findings.   Subjective: Chief Complaint  Patient presents with  . Right Knee - Pain    35 year od female with neck, low back and right knee pain. Pain with standing and walking, sometimes she must hop to decrease the pain. Pain in the posterior knee with squatting and kneeling. Some times the right knee with bent backwards. She has some joint laxity. She has MS and has joint pain. The right knee does not swell. Its like an internal pain or discomfort with hyperextension of the right knee.     Review of Systems  Constitutional: Negative.   HENT: Negative.   Eyes: Negative.   Respiratory: Negative.   Cardiovascular: Negative.   Gastrointestinal: Negative.   Endocrine: Negative.   Genitourinary: Negative.   Musculoskeletal: Negative.   Skin: Negative.     Allergic/Immunologic: Negative.   Neurological: Negative.   Hematological: Negative.   Psychiatric/Behavioral: Negative.      Objective: Vital Signs: BP 125/71 (BP Location: Left Arm, Patient Position: Sitting)   Pulse 79   Ht 5' 3.5" (1.613 m)   Wt 217 lb (98.4 kg)   BMI 37.84 kg/m   Physical Exam  Constitutional: She is oriented to person, place, and time. She appears well-developed and well-nourished.  HENT:  Head: Normocephalic and atraumatic.  Eyes: EOM are normal. Pupils are equal, round, and reactive to light.  Neck: Normal range of motion. Neck supple.  Pulmonary/Chest: Effort normal and breath sounds normal.  Abdominal: Soft. Bowel sounds are normal.  Musculoskeletal: Normal range of motion.       Right knee: She exhibits effusion.       Left knee: She exhibits effusion.  Neurological: She is alert and oriented to person, place, and time.  Skin: Skin is warm and dry.  Psychiatric: She has a normal mood and affect. Her behavior is normal. Judgment and thought content normal.    Right Knee Exam   Muscle Strength  The patient has normal right knee strength.  Tenderness  The patient is experiencing tenderness in the lateral joint line and LCL.  Range of Motion  Extension: -15  Flexion: 130  Tests  [place]:  Medial - negative Lateral - negative Varus: negative Valgus: negative Patellar apprehension: positive  Other  Erythema: absent Scars: absent Sensation: normal Pulse: present Effusion: effusion present   Left Knee Exam   Muscle Strength  The patient has normal left knee strength.  Range of Motion  Extension: -5  Flexion: 130   Tests  McMurray:  Medial - negative Lateral - negative Varus: negative Valgus: negative  Other  Erythema: absent Scars: absent Sensation: normal Pulse: present Swelling: mild Effusion: effusion present      Specialty Comments:  No specialty comments available.  Imaging: Xr Knee 1-2 Views  Right  Result Date: 02/12/2017 AP standing and lateral radiographs of the right knee with mild spurring within the intercondylar notch. Joint line is well maintained. Minimal superior and inferior pole patella osteophytes.    PMFS History: Patient Active Problem List   Diagnosis Date Noted  . Morbid obesity ([place]) 12/18/2016  . High risk medication use 07/04/2016  . Multiple sclerosis ([place]) 06/19/2016  . Neuropathic pain   . Neurogenic bowel   . Neurogenic bladder   . Anemia of chronic disease   . Transverse myelitis ([place]) 06/14/2016  . Allergic rhinitis 04/12/2016  . Sciatica associated with disorder of lumbosacral spine 01/15/2014  . GERD (gastroesophageal reflux disease) 07/21/2013  . RBBB on EKG 08/01/2012  . IBS (irritable bowel syndrome) 06/19/2012  . Healthcare maintenance 06/19/2012   Past Medical History:  Diagnosis Date  . Anemia   . Bowel obstruction ([place])   . GERD (gastroesophageal reflux disease)   . IBS (irritable bowel syndrome)    at age of 27  . Multiple sclerosis ([place])   . Sciatica 2009  . Vision abnormalities     Family History  Problem Relation Age of Onset  . Cancer Father        Gallbladder  . Gallbladder disease Father   . Hypertension Brother   . Gallbladder disease Paternal Grandmother   . Colon cancer Neg Hx   . Colon polyps Neg Hx   . Esophageal cancer Neg Hx   . Kidney disease Neg Hx     Past Surgical History:  Procedure Laterality Date  . ABDOMINAL HYSTERECTOMY  2008   cervix and right ovary still intact  . KNEE ARTHROSCOPY  2010 and 2011   Left knee, x2   Social History   Occupational History  . Occupation: Armed forces technical officer  Tobacco Use  . Smoking status: Never Smoker  . Smokeless tobacco: Never Used  Substance and Sexual Activity  . Alcohol use: No    Alcohol/week: 0.0 oz  . Drug use: No  . Sexual activity: Not on file

## 2017-02-12 NOTE — Patient Instructions (Addendum)
  Knee is suffering from osteoarthritis, only real proven treatments are Weight loss, Tylenol ES arthritis strength or Equate 650 mg up to 3-4 times per day and exercise. Well padded shoes help. Ice the knee 2-3 times a day 15-20 mins at a time. May use over the counter transdermal antiinflamatory agents. Voltaren gel 1% 4 gm applied 3-4 x per day for arthritis pain. Capsaicin creame used  Twice a day.

## 2017-02-13 ENCOUNTER — Ambulatory Visit: Payer: BLUE CROSS/BLUE SHIELD | Admitting: Neurology

## 2017-02-13 ENCOUNTER — Other Ambulatory Visit: Payer: Self-pay

## 2017-02-13 ENCOUNTER — Encounter: Payer: Self-pay | Admitting: Neurology

## 2017-02-13 VITALS — BP 143/87 | HR 67 | Ht 63.5 in | Wt 219.0 lb

## 2017-02-13 DIAGNOSIS — G373 Acute transverse myelitis in demyelinating disease of central nervous system: Secondary | ICD-10-CM

## 2017-02-13 DIAGNOSIS — G35 Multiple sclerosis: Secondary | ICD-10-CM | POA: Diagnosis not present

## 2017-02-13 DIAGNOSIS — G35D Multiple sclerosis, unspecified: Secondary | ICD-10-CM

## 2017-02-13 DIAGNOSIS — M792 Neuralgia and neuritis, unspecified: Secondary | ICD-10-CM | POA: Diagnosis not present

## 2017-02-13 DIAGNOSIS — N319 Neuromuscular dysfunction of bladder, unspecified: Secondary | ICD-10-CM

## 2017-02-13 MED ORDER — OXYBUTYNIN CHLORIDE 5 MG PO TABS: ORAL_TABLET | ORAL | 5 refills | Status: DC

## 2017-02-13 NOTE — Progress Notes (Signed)
[facility]  PATIENT: Tamara [name] DOB: 12-10-1960  REFERRING DOCTOR OR PCP:  Nadean Corwin SOURCE: Patient, notes from emergency room and recent hospital stay ([facility]), imaging and lab reports, MRI images on PACS.  _________________________________   HISTORICAL  CHIEF COMPLAINT:  Chief Complaint  Patient presents with  . Multiple Sclerosis    Sts. after each Tysabri infusion, she has 2-3 days of generalized joint pian, and fatigue that lasts for 7-10 days./fim    HISTORY OF PRESENT ILLNESS:  Tamara [name] Is a 57 year old woman who was diagnosed with multiple sclerosis in May 2018 after she presented with transverse myelitis.   Update 02/13/2017: She is on Tysabri and feels her MS is stable.   She does note that joint aches for 1-3 days after each infusion.   Sometimes she feels a little more tired for a week after the infusions.  She feels gait and balance are stable.  She still has a lot of dysesthesia.  She has some urinary urgency.   No incontinence.   She notes some blurry vision and black dots and night vision is worse.   Her MRI 05/2016 showed an enhancing focus at C3 and nonenhancing focus at C5 (and foci in brain nonenhancing)  She sees Dr. Otelia Sergeant for knees and shoulder pain.  He is trying to get her an exercise machine as she only gets 30 PT visits a year.     She is on Tylenol and tramadol for the pain.     She has fatigue helped by phentermine.   She has had trouble losing weight.  Phentermine has not helped weight loss though fatigue and wakefulness and apathy are much better.     From 10/11/2016:  MS:   She started Tysabri therapy July 2018 and she tolerates it well. The JCV antibody was negative (0.16) on 07/04/2016.     Gait/strength:  Her gait and balance improved after the second course of  IV Steroids and she feels she is still doing well.  The truncal dysestheias and leg heaviness also improved,   She still notes electric sensations in both hands.    Her hands also seem mildly weak and clumsy.  Her hand dysesthesias are very uncomfortable and limit some of her activities.  She has trouble holding heavy pots.   Baclofen was stopped due to HA and swelling.    Numbness/dysesthesia:   Initially, she had a lot of numbness below the waist and into the groin. That has improved. She continues to have numbness in the hands and she gets an uncomfortable shocklike dysesthesias   Gabapentin and Lyrica were not tolerated in the past.    Tegretol caused itching and Trileptal caused mild swelling in her mouth?  Bladder/bowel:  Bladder function is doing well. She has some urinary frequency and urgency but is not having any incontinence recently.   Vision:  She denied any major change in vision or diplopia.       Colors are normal and symmetric.     Fatigue/sleep:  She notes some fatigue, physical > cognitive.    The combination of being on Tysabri and the higher dose of phentermine have helped.   She is trying to exercise more.   The dysesthesias in her hands wake her up and sleep is poor many nights.    She notes fatigue and sleepiness are much better if she takes a nap, though then night time sleep is worse.  Mood/Cognition:  Depression and anxiety are better now  that she is on a DMT and more accepting of the diagnosis.   Cognition is fine.  MS History:   On 06/10/16, she had the onset of numbness and clumsiness in her legs.   As the day went on, her hands also became numb and clumsy.  She felt numb from her waist down.    She went to the [facility] and had an xtay of her back and was referred to orthopedics     Two days later, she went to the ED when her symptoms worsened and she was seen and discharged.    She saw her internist who ordered a lumbar MRI.  The lumbar MRI showed degenerative changes that were mild at L4-L5 and the study was otherwise fairly normal.   She was referred to the ED and had an MRI of the brain, cervical spine and thoracic  spine.    She was found to have a transverse myelitis in the cervical spine with an enhancing lesion in the posterior columns. There was also a smaller non-enhancing focus at C5   She also had 5-6 spots in her brain, the largest being periventricular on the right.  She was admitted to [facility] and had 3 days of IV Solumedrol followed by an oral taper.    She was transferred to rehabilitation and was discharged 5/30.   She walked out and felt much better.   Additionally, while she was in the hospital (06/15/2016) she had a lumbar puncture. The CSF was abnormal showing showing the presence  4 oligoclonal bands and an elevated IgG index of 0.9 (less than 0.7 is normal). The myelin basic protein was mildly elevated at 2.2.      He had another exacerbation after returning home with left leg dragging and feeling more off balance with stumbling.   In retrospect, in 2017, she had an episode lasting a few weeks where she felt she was dragging her left leg some. This completely resolved and she did not have any other symptoms such as numbness at that time.   MRIs of the brain, cervical spine, thoracic spine and lumbar spine were personally reviewed. The MRI of the cervical spine shows an enhancing lesion posterior spinal cord adjacent to C2-C3. Additionally there is a nonenhancing focus adjacent to C5. In the brain, there are several T2/FLAIR hyperintense foci and the largest is in the right parietal lobe and there are 4 or 5 other small foci, one in the periventricular white matter of the right frontal lobe and the rest in the subcortical white matter.    Laboratory studies show 4 oligoclonal bands and an elevated IgG index of 0.9. Myelin basic protein was mildly elevated at 2.2. She had elevated glucose between 120 and 140 several times while getting steroids.   Her Vit D was mildly low (25.6) and she just started OTC supplementation.   She started Tysabri in July 2018   REVIEW OF SYSTEMS: Constitutional: No fevers,  chills, sweats, or change in appetite Eyes: No visual changes, double vision, eye pain Ear, nose and throat: No hearing loss, ear pain, nasal congestion, sore throat Cardiovascular: No chest pain, palpitations Respiratory: No shortness of breath at rest or with exertion.   No wheezes GastrointestinaI: No nausea, vomiting, diarrhea, abdominal pain, fecal incontinence Genitourinary: No dysuria, urinary retention or frequency.  No nocturia. Musculoskeletal: No neck pain, back pain Integumentary: No rash, pruritus, skin lesions Neurological: as above Psychiatric: No depression at this time.  No anxiety  Endocrine: No palpitations, diaphoresis, change in appetite, change in weigh or increased thirst Hematologic/Lymphatic: No anemia, purpura, petechiae. Allergic/Immunologic: No itchy/runny eyes, nasal congestion, recent allergic reactions, rashes  ALLERGIES: Allergies  Allergen Reactions  . Codeine Other (See Comments)    Delusions  . Dilaudid [Hydromorphone Hcl] Swelling    Tongue swells   . Paxil [Paroxetine Hcl] Other (See Comments)    Hallucinations and heavy periods   . Tegretol [Carbamazepine] Itching  . Trileptal [Oxcarbazepine] Swelling  . Adhesive [Tape] Hives, Itching and Rash    PAPER TAPE=   . Augmentin [Amoxicillin-Pot Clavulanate] Rash  . Gabapentin Rash    Pt. sts. Gabapentin caused rash, hallucinations and depression    HOME MEDICATIONS:  Current Outpatient Medications:  .  acetaminophen (TYLENOL) 500 MG tablet, Take 1 tablet (500 mg total) by mouth every 6 (six) hours as needed., Disp: 100 tablet, Rfl: 0 .  b complex vitamins tablet, Take 1 tablet by mouth daily., Disp: , Rfl:  .  cetirizine (ZYRTEC ALLERGY) 10 MG tablet, Take 1 tablet (10 mg total) by mouth daily. (Patient taking differently: Take 10 mg by mouth daily as needed for allergies. ), Disp: 30 tablet, Rfl: 2 .  chlorpheniramine-HYDROcodone (TUSSIONEX PENNKINETIC ER) 10-8 MG/5ML SUER, Take 5 mLs at  bedtime as needed by mouth for cough., Disp: 100 mL, Rfl: 0 .  cholecalciferol (VITAMIN D) 400 UNITS TABS tablet, Take 400 Units by mouth daily. , Disp: , Rfl:  .  Collagen 500 MG CAPS, Take by mouth., Disp: , Rfl:  .  diclofenac sodium (VOLTAREN) 1 % GEL, Apply 2 g topically 3 (three) times daily., Disp: 1 Tube, Rfl: 0 .  docusate sodium (COLACE) 100 MG capsule, Take 1 capsule (100 mg total) by mouth daily., Disp: 10 capsule, Rfl: 0 .  fluticasone (FLONASE) 50 MCG/ACT nasal spray, Place 2 sprays into both nostrils daily., Disp: 16 g, Rfl: 2 .  guaiFENesin-dextromethorphan (ROBITUSSIN DM) 100-10 MG/5ML syrup, Take 5 mLs by mouth every 4 (four) hours as needed for cough., Disp: 118 mL, Rfl: 0 .  imipramine (TOFRANIL) 25 MG tablet, Take 2 tablets (50 mg total) by mouth at bedtime., Disp: 60 tablet, Rfl: 5 .  Multiple Vitamin (MULTIVITAMIN) tablet, Take 1 tablet by mouth daily., Disp: , Rfl:  .  Multiple Vitamins-Minerals (HAIR SKIN AND NAILS FORMULA) TABS, Take 1 tablet by mouth daily., Disp: , Rfl:  .  natalizumab (TYSABRI) 300 MG/15ML injection, Inject 300 mg into the vein every 30 (thirty) days., Disp: , Rfl:  .  Omega-3 Fatty Acids (FISH OIL) 1000 MG CAPS, Take 1,000-2,000 mg by mouth See admin instructions. Take 2 capsules in the morning and take 1 capsule at bedtime , Disp: , Rfl:  .  pantoprazole (PROTONIX) 40 MG tablet, TAKE 1 TABLET BY MOUTH DAILY, Disp: 90 tablet, Rfl: 6 .  phentermine 37.5 MG capsule, Take 1 capsule (37.5 mg total) by mouth every morning., Disp: 30 capsule, Rfl: 5 .  traMADol (ULTRAM) 50 MG tablet, TAKE 1 TABLET BY MOUTH TWICE DAILY AS NEEDED FOR SEVERE PAIN, Disp: 60 tablet, Rfl: 0 .  vitamin E 100 UNIT capsule, Take 100 Units by mouth daily., Disp: , Rfl:  .  oxybutynin (DITROPAN) 5 MG tablet, Take one or two a day po, Disp: 60 tablet, Rfl: 5  PAST MEDICAL HISTORY: Past Medical History:  Diagnosis Date  . Anemia   . Bowel obstruction (HCC)   . GERD (gastroesophageal  reflux disease)   . IBS (irritable bowel syndrome)  at age of 23  . Multiple sclerosis (HCC)   . Sciatica 2009  . Vision abnormalities     PAST SURGICAL HISTORY: Past Surgical History:  Procedure Laterality Date  . ABDOMINAL HYSTERECTOMY  2008   cervix and right ovary still intact  . KNEE ARTHROSCOPY  2010 and 2011   Left knee, x2    FAMILY HISTORY: Family History  Problem Relation Age of Onset  . Cancer Father        Gallbladder  . Gallbladder disease Father   . Hypertension Brother   . Gallbladder disease Paternal Grandmother   . Colon cancer Neg Hx   . Colon polyps Neg Hx   . Esophageal cancer Neg Hx   . Kidney disease Neg Hx     SOCIAL HISTORY:  Social History   Socioeconomic History  . Marital status: Married    Spouse name: Not on file  . Number of children: 2  . Years of education: Not on file  . Highest education level: Not on file  Social Needs  . Financial resource strain: Not on file  . Food insecurity - worry: Not on file  . Food insecurity - inability: Not on file  . Transportation needs - medical: Not on file  . Transportation needs - non-medical: Not on file  Occupational History  . Occupation: Science writer  Tobacco Use  . Smoking status: Never Smoker  . Smokeless tobacco: Never Used  Substance and Sexual Activity  . Alcohol use: No    Alcohol/week: 0.0 oz  . Drug use: No  . Sexual activity: Not on file  Other Topics Concern  . Not on file  Social History Narrative  . Not on file     PHYSICAL EXAM  Vitals:   02/13/17 1641  BP: (!) 143/87  Pulse: 67  Weight: 219 lb (99.3 kg)  Height: 5' 3.5" (1.613 m)    Body mass index is 38.19 kg/m.   General: The patient is well-developed and well-nourished and in no acute distress   Neurologic Exam  Mental status: The patient is alert and oriented x 3 at the time of the examination. The patient has apparent normal recent and remote memory, with an apparently normal attention  span and concentration ability.   Speech is normal.  Cranial nerves: Extraocular movements are full. Facial strength and sensation is normal. Trapezius strength is normal..  The tongue is midline, and the patient has symmetric elevation of the soft palate. No obvious hearing deficits are noted.  Motor:  Muscle bulk is normal.   Muscle tone is normal. Strength is 5/5.  Sensory: She reports normal and symmetric sensation to touch and vibration in the arms and legs.   Coordination: Cerebellar testing reveals good finger-nose-finger and normal heel-to-shin bilaterally.  Gait and station: Station is normal.   Gait is fairly normal but tandem gait is moderately wide..   She does not have a Romberg sign.  Reflexes:  . Deep tendon reflexes are 3 and symmetric in the knees and ankles and 2 and symmetric in the arms.      DIAGNOSTIC DATA (LABS, IMAGING, TESTING) - I reviewed patient records, labs, notes, testing and imaging myself where available.  Lab Results  Component Value Date   WBC 7.3 07/09/2016   HGB 10.2 (L) 07/09/2016   HCT 32.0 (L) 07/09/2016   MCV 88.9 07/09/2016   PLT 190 07/09/2016      Component Value Date/Time   NA 143 12/19/2016 1623   K  5.0 12/19/2016 1623   CL 104 12/19/2016 1623   CO2 27 12/19/2016 1623   GLUCOSE 78 12/19/2016 1623   GLUCOSE 91 07/09/2016 2107   BUN 9 12/19/2016 1623   CREATININE 0.91 12/19/2016 1623   CREATININE 1.01 07/08/2013 1440   CALCIUM 9.5 12/19/2016 1623   PROT 6.1 12/19/2016 1623   ALBUMIN 4.2 12/19/2016 1623   AST 16 12/19/2016 1623   ALT 11 12/19/2016 1623   ALKPHOS 70 12/19/2016 1623   BILITOT <0.2 12/19/2016 1623   GFRNONAA 71 12/19/2016 1623   GFRNONAA 64 07/08/2013 1440   GFRAA 82 12/19/2016 1623   GFRAA 74 07/08/2013 1440   No results found for: CHOL, HDL, LDLCALC, LDLDIRECT, TRIG, CHOLHDL No results found for: UXLK4M Lab Results  Component Value Date   VITAMINB12 2,209 (H) 06/15/2016   Lab Results  Component Value  Date   TSH 0.351 06/15/2016       ASSESSMENT AND PLAN  Multiple sclerosis (HCC) - Plan: MR BRAIN W WO CONTRAST, Stratify JCV Antibody Test ([facility]), CBC with Differential/Platelet  Transverse myelitis (HCC)  Neurogenic bladder  Neuropathic pain    1.    She will continue Tysabri 300 mg every 4 weeks.  Which comes in for next infusion and we will check a JCV antibody and CBC 2.    Continue imipramine for the nocturia and dysesthesias. I will add a daytime oxybutynin as needed for her bladder for when she leaves the house  3.    Stay active and exercise.     4.    RTC 6  months.   She is advised to call sooner if she has any new or worsening neurologic symptoms.    Savaughn Karwowski A. Epimenio Foot, MD, PhD, [place]  02/13/2017, 6:33 PM Certified in Neurology, Clinical Neurophysiology, Sleep Medicine, Pain Medicine and Neuroimaging Dir., [facility]  [facility] [address] [place], [place] [address] [phone]

## 2017-02-14 ENCOUNTER — Telehealth: Payer: Self-pay | Admitting: Neurology

## 2017-02-14 DIAGNOSIS — G35 Multiple sclerosis: Secondary | ICD-10-CM | POA: Diagnosis not present

## 2017-02-14 MED ORDER — ALPRAZOLAM 0.5 MG PO TABS: ORAL_TABLET | ORAL | 0 refills | Status: DC

## 2017-02-14 NOTE — Telephone Encounter (Signed)
Rx. faxed to [facility] ([place]).  I tried to call pt. to let her know; phone rang with no answer, no ans. machine/fim

## 2017-02-14 NOTE — Telephone Encounter (Signed)
Pt is returning call.  

## 2017-02-14 NOTE — Telephone Encounter (Signed)
Patient is schedule to have her MRI Brain next Wednesday 02/21/17 on the [facility] mobile unit. She did inform me she is claustrophobic and would like something to calm her nerves.

## 2017-02-14 NOTE — Telephone Encounter (Signed)
Rx. awaiting RAS sig/fim 

## 2017-02-21 ENCOUNTER — Ambulatory Visit: Payer: BLUE CROSS/BLUE SHIELD

## 2017-02-21 DIAGNOSIS — G35 Multiple sclerosis: Secondary | ICD-10-CM

## 2017-02-21 MED ORDER — GADOPENTETATE DIMEGLUMINE 469.01 MG/ML IV SOLN: 20.0000 mL | Freq: Once | INTRAVENOUS | Status: DC | PRN

## 2017-02-21 NOTE — Addendum Note (Signed)
Addended by: Inis Sizer D on: 02/21/2017 02:17 PM   Modules accepted: Orders

## 2017-02-22 ENCOUNTER — Ambulatory Visit: Payer: BLUE CROSS/BLUE SHIELD

## 2017-02-22 ENCOUNTER — Telehealth: Payer: Self-pay | Admitting: *Deleted

## 2017-02-22 LAB — CBC WITH DIFFERENTIAL/PLATELET
BASOS ABS: 0 10*3/uL (ref 0.0–0.2)
Basos: 0 %
EOS (ABSOLUTE): 0 10*3/uL (ref 0.0–0.4)
Eos: 1 %
HEMATOCRIT: 35.2 % (ref 34.0–46.6)
HEMOGLOBIN: 11.6 g/dL (ref 11.1–15.9)
IMMATURE GRANS (ABS): 0 10*3/uL (ref 0.0–0.1)
Immature Granulocytes: 0 %
Lymphocytes Absolute: 2.6 10*3/uL (ref 0.7–3.1)
Lymphs: 39 %
MCH: 27.8 pg (ref 26.6–33.0)
MCHC: 33 g/dL (ref 31.5–35.7)
MCV: 84 fL (ref 79–97)
MONOCYTES: 7 %
Monocytes Absolute: 0.5 10*3/uL (ref 0.1–0.9)
NEUTROS ABS: 3.6 10*3/uL (ref 1.4–7.0)
Neutrophils: 53 %
PLATELETS: 194 10*3/uL (ref 150–379)
RBC: 4.17 x10E6/uL (ref 3.77–5.28)
RDW: 15.6 % — AB (ref 12.3–15.4)
WBC: 6.7 10*3/uL (ref 3.4–10.8)

## 2017-02-22 NOTE — Telephone Encounter (Signed)
-----   Message from Britt Bottom, MD sent at 02/22/2017  1:22 PM EST ----- Please let her know that the MRI of the brain did not show any new lesions.

## 2017-02-22 NOTE — Telephone Encounter (Signed)
Spoke with Jannah and reviewed below MRI results.  She verbalized understanding of same/fim

## 2017-02-23 ENCOUNTER — Ambulatory Visit: Payer: BLUE CROSS/BLUE SHIELD | Admitting: Rehabilitation

## 2017-02-26 ENCOUNTER — Ambulatory Visit: Payer: BLUE CROSS/BLUE SHIELD | Attending: Specialist | Admitting: Physical Therapy

## 2017-02-26 ENCOUNTER — Encounter: Payer: Self-pay | Admitting: Physical Therapy

## 2017-02-26 DIAGNOSIS — R2689 Other abnormalities of gait and mobility: Secondary | ICD-10-CM | POA: Insufficient documentation

## 2017-02-26 DIAGNOSIS — G8929 Other chronic pain: Secondary | ICD-10-CM | POA: Diagnosis present

## 2017-02-26 DIAGNOSIS — M6281 Muscle weakness (generalized): Secondary | ICD-10-CM | POA: Diagnosis not present

## 2017-02-26 DIAGNOSIS — R293 Abnormal posture: Secondary | ICD-10-CM | POA: Insufficient documentation

## 2017-02-26 DIAGNOSIS — R208 Other disturbances of skin sensation: Secondary | ICD-10-CM | POA: Diagnosis not present

## 2017-02-26 DIAGNOSIS — M25561 Pain in right knee: Secondary | ICD-10-CM | POA: Diagnosis not present

## 2017-02-26 DIAGNOSIS — M25562 Pain in left knee: Secondary | ICD-10-CM | POA: Insufficient documentation

## 2017-02-26 NOTE — Therapy (Addendum)
[place], [place], [address] Phone: [phone]   Fax:  [phone]  Physical Therapy Evaluation and Addended Discharge   Patient Details  Name: Tamara [name] MRN: [id number] Date of Birth: 03/23/1960 Referring Provider: Dr. Louanne Skye   Encounter Date: 02/26/2017  PT End of Session - 02/26/17 1458    Visit Number  1    Number of Visits  12    Date for PT Re-Evaluation  04/09/17    Authorization Type  [employer]-30 visit limit for PT    PT Start Time  1428 late for appt 2:15    PT Stop Time  1506    PT Time Calculation (min)  38 min    Activity Tolerance  Patient tolerated treatment well    Behavior During Therapy  [facility] for tasks assessed/performed       Past Medical History:  Diagnosis Date  . Anemia   . Bowel obstruction ([place])   . GERD (gastroesophageal reflux disease)   . IBS (irritable bowel syndrome)    at age of 67  . Multiple sclerosis ([place])   . Sciatica 2009  . Vision abnormalities     Past Surgical History:  Procedure Laterality Date  . ABDOMINAL HYSTERECTOMY  2008   cervix and right ovary still intact  . KNEE ARTHROSCOPY  2010 and 2011   Left knee, x2    There were no vitals filed for this visit.   Subjective Assessment - 02/26/17 1432    Subjective  Pt was diagnosed with MS in May 2018.  She suffers from subsequent pain in LE joints, back and hands.  She has "electricity" in her hands alot of the time.  She has (posterior) knee pain that has worsened over the past several months.   She got to a point to where her knee pain prevented her from walking, exercising and sometimes even getting out of bed.  She has had one fall but has had many near falls, gait instability in general.  She was approved for insurance coverage for a [employer] as has an insurance limit.     Pertinent History  Multiple Sclerosis, tranverse Myelitis, low back pain, obesity, L knee scope x 2     Limitations   Lifting;Standing;Walking;House hold activities    Patient Stated Goals  Get stronger    Currently in Pain?  Yes    Pain Score  8  "14"    Pain Location  Knee    Pain Orientation  Right;Posterior    Pain Descriptors / Indicators  Aching;Sore;Shooting    Pain Type  Chronic pain    Pain Onset  More than a month ago    Pain Frequency  Constant    Aggravating Factors   is constant     Pain Relieving Factors  pain meds, uses heat and ice and nothing help     Effect of Pain on Daily Activities  deals with alot of pain          [facility] PT Assessment - 02/26/17 0001      Assessment   Medical Diagnosis  knee pain, Tamara, ligament laxity     Referring Provider  Dr. Louanne Skye    Onset Date/Surgical Date  -- May 2018     Prior Therapy  Yes, did Neuro last summer       Precautions   Precautions  None    Precaution Comments  avoid overheating     Required Braces or Orthoses  --  are poor.  She will benefit from strengthening bilateral LEs  and core to improve ability to walk in her home and in the community.      History and Personal Factors relevant to plan of care:  obesity, abnormal LE alignment, falls/near falls, sciatica     Clinical Presentation  Evolving    Clinical Presentation due to:  progressive condition of MS, nature of condition    Clinical Decision Making  Moderate    Rehab Potential  Excellent    PT Frequency  2x / week    PT Duration  6 weeks    PT Treatment/Interventions  ADLs/Self Care Home Management;Gait training;Stair training;Functional mobility training;Therapeutic activities;Therapeutic exercise;Balance training;Neuromuscular re-education;Patient/family education;Energy conservation;Vestibular;Cryotherapy;Electrical Stimulation;Ultrasound;Moist Heat;Iontophoresis 32m/ml Dexamethasone;Manual techniques;Taping    PT Next Visit Plan  HEP development, balance screen, consider tape     PT Home Exercise Plan  none , has from previous pT episode but has not done lately     Consulted and Agree with Plan of Care  Patient       Patient will benefit from skilled therapeutic intervention in order to improve the following deficits and impairments:  Abnormal gait, Decreased activity tolerance, Decreased balance, Decreased endurance, Decreased mobility, Decreased strength, Impaired perceived functional ability, Impaired sensation, Pain, Increased fascial restricitons, Difficulty walking, Decreased range of motion, Obesity, Cardiopulmonary status limiting activity, Impaired flexibility, Hypomobility, Postural dysfunction, Hypermobility  Visit Diagnosis: Muscle weakness (generalized)  Other disturbances of skin sensation  Other abnormalities of gait and mobility  Chronic pain of right knee  Chronic pain of left knee  Abnormal posture     Problem List Patient Active Problem List   Diagnosis Date Noted  . Morbid obesity ([place] 12/18/2016  . High risk medication use 07/04/2016  . Multiple sclerosis ([place]  06/19/2016  . Neuropathic pain   . Neurogenic bowel   . Neurogenic bladder   . Anemia of chronic disease   . Transverse myelitis ([place] 06/14/2016  . Allergic rhinitis 04/12/2016  . Sciatica associated with disorder of lumbosacral spine 01/15/2014  . GERD (gastroesophageal reflux disease) 07/21/2013  . RBBB on EKG 08/01/2012  . IBS (irritable bowel syndrome) 06/19/2012  . Healthcare maintenance 06/19/2012    Tamara [name] 02/26/2017, 7:23 PM  [facility] [address]Phone: [phone]  Fax:  [phone] Name: Tamara ForrerMRN: [id number] of Birth: 9Nov 09, 1962  JRaeford Razor PT 02/26/17 7:23 PM Phone: [phone]: [phone] PHYSICAL THERAPY DISCHARGE SUMMARY  Visits from Start of Care: 1  Current functional level related to goals / functional outcomes: See above    Remaining deficits: See above    Education / Equipment: HEP  Plan: Patient agrees to discharge.  Patient goals were not met. Patient is being discharged due to not returning since the last visit.  ?????    JRaeford Razor PT 04/05/17 11:29 AM Phone: [phone]: [phone]  [place], [place], [address] Phone: [phone]   Fax:  [phone]  Physical Therapy Evaluation and Addended Discharge   Patient Details  Name: Tamara [name] MRN: [id number] Date of Birth: 03/23/1960 Referring Provider: Dr. Louanne Skye   Encounter Date: 02/26/2017  PT End of Session - 02/26/17 1458    Visit Number  1    Number of Visits  12    Date for PT Re-Evaluation  04/09/17    Authorization Type  [employer]-30 visit limit for PT    PT Start Time  1428 late for appt 2:15    PT Stop Time  1506    PT Time Calculation (min)  38 min    Activity Tolerance  Patient tolerated treatment well    Behavior During Therapy  [facility] for tasks assessed/performed       Past Medical History:  Diagnosis Date  . Anemia   . Bowel obstruction ([place])   . GERD (gastroesophageal reflux disease)   . IBS (irritable bowel syndrome)    at age of 67  . Multiple sclerosis ([place])   . Sciatica 2009  . Vision abnormalities     Past Surgical History:  Procedure Laterality Date  . ABDOMINAL HYSTERECTOMY  2008   cervix and right ovary still intact  . KNEE ARTHROSCOPY  2010 and 2011   Left knee, x2    There were no vitals filed for this visit.   Subjective Assessment - 02/26/17 1432    Subjective  Pt was diagnosed with MS in May 2018.  She suffers from subsequent pain in LE joints, back and hands.  She has "electricity" in her hands alot of the time.  She has (posterior) knee pain that has worsened over the past several months.   She got to a point to where her knee pain prevented her from walking, exercising and sometimes even getting out of bed.  She has had one fall but has had many near falls, gait instability in general.  She was approved for insurance coverage for a [employer] as has an insurance limit.     Pertinent History  Multiple Sclerosis, tranverse Myelitis, low back pain, obesity, L knee scope x 2     Limitations   Lifting;Standing;Walking;House hold activities    Patient Stated Goals  Get stronger    Currently in Pain?  Yes    Pain Score  8  "14"    Pain Location  Knee    Pain Orientation  Right;Posterior    Pain Descriptors / Indicators  Aching;Sore;Shooting    Pain Type  Chronic pain    Pain Onset  More than a month ago    Pain Frequency  Constant    Aggravating Factors   is constant     Pain Relieving Factors  pain meds, uses heat and ice and nothing help     Effect of Pain on Daily Activities  deals with alot of pain          [facility] PT Assessment - 02/26/17 0001      Assessment   Medical Diagnosis  knee pain, Tamara, ligament laxity     Referring Provider  Dr. Louanne Skye    Onset Date/Surgical Date  -- May 2018     Prior Therapy  Yes, did Neuro last summer       Precautions   Precautions  None    Precaution Comments  avoid overheating     Required Braces or Orthoses  --  are poor.  She will benefit from strengthening bilateral LEs  and core to improve ability to walk in her home and in the community.      History and Personal Factors relevant to plan of care:  obesity, abnormal LE alignment, falls/near falls, sciatica     Clinical Presentation  Evolving    Clinical Presentation due to:  progressive condition of MS, nature of condition    Clinical Decision Making  Moderate    Rehab Potential  Excellent    PT Frequency  2x / week    PT Duration  6 weeks    PT Treatment/Interventions  ADLs/Self Care Home Management;Gait training;Stair training;Functional mobility training;Therapeutic activities;Therapeutic exercise;Balance training;Neuromuscular re-education;Patient/family education;Energy conservation;Vestibular;Cryotherapy;Electrical Stimulation;Ultrasound;Moist Heat;Iontophoresis 32m/ml Dexamethasone;Manual techniques;Taping    PT Next Visit Plan  HEP development, balance screen, consider tape     PT Home Exercise Plan  none , has from previous pT episode but has not done lately     Consulted and Agree with Plan of Care  Patient       Patient will benefit from skilled therapeutic intervention in order to improve the following deficits and impairments:  Abnormal gait, Decreased activity tolerance, Decreased balance, Decreased endurance, Decreased mobility, Decreased strength, Impaired perceived functional ability, Impaired sensation, Pain, Increased fascial restricitons, Difficulty walking, Decreased range of motion, Obesity, Cardiopulmonary status limiting activity, Impaired flexibility, Hypomobility, Postural dysfunction, Hypermobility  Visit Diagnosis: Muscle weakness (generalized)  Other disturbances of skin sensation  Other abnormalities of gait and mobility  Chronic pain of right knee  Chronic pain of left knee  Abnormal posture     Problem List Patient Active Problem List   Diagnosis Date Noted  . Morbid obesity ([place] 12/18/2016  . High risk medication use 07/04/2016  . Multiple sclerosis ([place]  06/19/2016  . Neuropathic pain   . Neurogenic bowel   . Neurogenic bladder   . Anemia of chronic disease   . Transverse myelitis ([place] 06/14/2016  . Allergic rhinitis 04/12/2016  . Sciatica associated with disorder of lumbosacral spine 01/15/2014  . GERD (gastroesophageal reflux disease) 07/21/2013  . RBBB on EKG 08/01/2012  . IBS (irritable bowel syndrome) 06/19/2012  . Healthcare maintenance 06/19/2012    Tamara [name] 02/26/2017, 7:23 PM  [facility] [address]Phone: [phone]  Fax:  [phone] Name: Tamara ForrerMRN: [id number] of Birth: 9Nov 09, 1962  JRaeford Razor PT 02/26/17 7:23 PM Phone: [phone]: [phone] PHYSICAL THERAPY DISCHARGE SUMMARY  Visits from Start of Care: 1  Current functional level related to goals / functional outcomes: See above    Remaining deficits: See above    Education / Equipment: HEP  Plan: Patient agrees to discharge.  Patient goals were not met. Patient is being discharged due to not returning since the last visit.  ?????    JRaeford Razor PT 04/05/17 11:29 AM Phone: [phone]: [phone]

## 2017-02-28 ENCOUNTER — Ambulatory Visit: Payer: BLUE CROSS/BLUE SHIELD | Admitting: Physical Therapy

## 2017-02-28 NOTE — Telephone Encounter (Signed)
error 

## 2017-03-07 ENCOUNTER — Ambulatory Visit: Payer: BLUE CROSS/BLUE SHIELD | Attending: Specialist | Admitting: Physical Therapy

## 2017-03-08 ENCOUNTER — Other Ambulatory Visit: Payer: Self-pay | Admitting: Neurology

## 2017-03-08 ENCOUNTER — Telehealth: Payer: Self-pay | Admitting: Physical Therapy

## 2017-03-08 NOTE — Telephone Encounter (Signed)
Attempted to call regarding missed appointment on 03/07/2017 and when her next appointment was. Was unable to LVM due to no voicemail.

## 2017-03-13 ENCOUNTER — Telehealth: Payer: Self-pay | Admitting: Physical Therapy

## 2017-03-13 ENCOUNTER — Ambulatory Visit: Payer: BLUE CROSS/BLUE SHIELD | Admitting: Physical Therapy

## 2017-03-13 NOTE — Telephone Encounter (Signed)
Patient did not arrive to her 11:45 appt for PT today.  I was unable to leave a voicmail.  Per our policy, 2nd no show results in cancellation of the remainder of her appts.  She will need a new referral and will need to schedule 1 appt at a time.   Raeford Razor, PT 03/13/17 12:10 PM Phone: [phone] Fax: [phone]

## 2017-03-15 ENCOUNTER — Ambulatory Visit (INDEPENDENT_AMBULATORY_CARE_PROVIDER_SITE_OTHER): Payer: BLUE CROSS/BLUE SHIELD | Admitting: Specialist

## 2017-03-15 DIAGNOSIS — G35 Multiple sclerosis: Secondary | ICD-10-CM | POA: Diagnosis not present

## 2017-03-16 ENCOUNTER — Encounter: Payer: BLUE CROSS/BLUE SHIELD | Admitting: Physical Therapy

## 2017-03-20 ENCOUNTER — Encounter: Payer: BLUE CROSS/BLUE SHIELD | Admitting: Physical Therapy

## 2017-03-23 ENCOUNTER — Encounter: Payer: BLUE CROSS/BLUE SHIELD | Admitting: Physical Therapy

## 2017-03-27 ENCOUNTER — Encounter: Payer: BLUE CROSS/BLUE SHIELD | Admitting: Physical Therapy

## 2017-03-30 ENCOUNTER — Other Ambulatory Visit: Payer: Self-pay

## 2017-03-30 ENCOUNTER — Encounter: Payer: BLUE CROSS/BLUE SHIELD | Admitting: Physical Therapy

## 2017-03-30 ENCOUNTER — Ambulatory Visit (INDEPENDENT_AMBULATORY_CARE_PROVIDER_SITE_OTHER): Payer: BLUE CROSS/BLUE SHIELD | Admitting: Internal Medicine

## 2017-03-30 ENCOUNTER — Telehealth: Payer: Self-pay | Admitting: *Deleted

## 2017-03-30 VITALS — BP 122/58 | HR 78 | Temp 98.6°F | Ht 63.0 in | Wt 220.2 lb

## 2017-03-30 DIAGNOSIS — K580 Irritable bowel syndrome with diarrhea: Secondary | ICD-10-CM | POA: Diagnosis not present

## 2017-03-30 DIAGNOSIS — R112 Nausea with vomiting, unspecified: Secondary | ICD-10-CM | POA: Diagnosis not present

## 2017-03-30 DIAGNOSIS — R197 Diarrhea, unspecified: Secondary | ICD-10-CM | POA: Diagnosis not present

## 2017-03-30 DIAGNOSIS — K529 Noninfective gastroenteritis and colitis, unspecified: Secondary | ICD-10-CM | POA: Diagnosis not present

## 2017-03-30 MED ORDER — ONDANSETRON HCL 4 MG PO TABS: 4.0000 mg | ORAL_TABLET | Freq: Every day | ORAL | 1 refills | Status: DC | PRN

## 2017-03-30 NOTE — Telephone Encounter (Signed)
Call from pt - stated since 6 pm yesterday, she has been having diarrhea, vomiting and weakness. Denies fever,coughing, sneezing. She took some Vit C and airborne tabs. Informed she needs to be evaluated; finally agreed to come in today in [facility] @ 1430 PM for 1445 PM slot.

## 2017-03-30 NOTE — Patient Instructions (Signed)
FOLLOW-UP INSTRUCTIONS When: in June with Dr. Hetty Ely or sooner if not getting better For: routine follow up What to bring: current medications  It sounds like you have a GI bug of some sort.  We will give you zofran for your nausea and check labs to make sure you are not too dehydrated.  Otherwise, contniue to push as much fluids as possible to stay hydrated.  Please come back if you are not getting better over the weekend or you develop fevers or bad abdominal pain.

## 2017-03-30 NOTE — Telephone Encounter (Signed)
agree

## 2017-03-30 NOTE — Progress Notes (Signed)
   CC: N/V/D  HPI:  [name] is a 57 y.o. woman with a past medical history listed below here today for acute N/V/D.  For details of today's visit and the status of her chronic medical issues please refer to the assessment and plan.   Past Medical History:  Diagnosis Date  . Anemia   . Bowel obstruction ([place])   . GERD (gastroesophageal reflux disease)   . IBS (irritable bowel syndrome)    at age of 55  . Multiple sclerosis ([place])   . Sciatica 2009  . Vision abnormalities    Review of Systems:  Please see pertinent ROS reviewed in HPI and problem based charting.   Physical Exam:  Vitals:   03/30/17 1446  BP: (!) 122/58  Pulse: 78  Temp: 98.6 F (37 C)  TempSrc: Oral  SpO2: 100%  Weight: 220 lb 3.2 oz (99.9 kg)  Height: 5\' 3"  (1.6 m)   Physical Exam  Constitutional: She is oriented to person, place, and time and well-developed, well-nourished, and in no distress.  She is non-toxic appearing.  She is able to have conversation and smile   HENT:  Head: Normocephalic and atraumatic.  Cardiovascular: Normal rate and regular rhythm.  Pulmonary/Chest: Effort normal and breath sounds normal.  Abdominal: Soft. Bowel sounds are normal. She exhibits no distension. There is no tenderness. There is no rebound and no guarding.  Neurological: She is alert and oriented to person, place, and time.  Psychiatric: Mood and affect normal.     Assessment & Plan:   See Encounters Tab for problem based charting.  Patient discussed with Dr. Lynnae January.  Gastroenteritis Assessment: Pt presents with n/v/d since 6pm last evening.  She began to feel nauseated and soon after developed vomiting that was non-bloody and non-bilious.  She has been trying to push fluids but has been limited due to nausea.  Last night she also developed diarrhea and has probably had 10 occurrences per her report.  There has been no blood in her stool.  She denies fever, chills, abdominal pain, or other infectious  symptoms.  She reports this is unlike her typical IBS flares because those involve pain.  She does not recall any sick contacts and does not recall any foods that could have contributed to current flare.  Plan: - symptoms c/w viral GI bug.  Currently, she is non-toxic appearing and does not appear acutely ill.  No fevers or abdominal pain to suggest the need for imaging - Rx zofran 4mg  tabs PRN - Push fluids - Check CMET to assess for dehydration, LFT abnormalities - RTC as needed

## 2017-03-30 NOTE — Assessment & Plan Note (Signed)
Assessment: Pt presents with n/v/d since 6pm last evening.  She began to feel nauseated and soon after developed vomiting that was non-bloody and non-bilious.  She has been trying to push fluids but has been limited due to nausea.  Last night she also developed diarrhea and has probably had 10 occurrences per her report.  There has been no blood in her stool.  She denies fever, chills, abdominal pain, or other infectious symptoms.  She reports this is unlike her typical IBS flares because those involve pain.  She does not recall any sick contacts and does not recall any foods that could have contributed to current flare.  Plan: - symptoms c/w viral GI bug.  Currently, she is non-toxic appearing and does not appear acutely ill.  No fevers or abdominal pain to suggest the need for imaging - Rx zofran 4mg  tabs PRN - Push fluids - Check CMET to assess for dehydration, LFT abnormalities - RTC as needed

## 2017-03-30 NOTE — Telephone Encounter (Signed)
I agree with need for evaluation, she is on immunosuppressive therapy.

## 2017-03-31 ENCOUNTER — Telehealth: Payer: Self-pay | Admitting: Internal Medicine

## 2017-03-31 LAB — CMP14 + ANION GAP
A/G RATIO: 2.1 (ref 1.2–2.2)
ALBUMIN: 4.4 g/dL (ref 3.5–5.5)
ALK PHOS: 72 IU/L (ref 39–117)
ALT: 15 IU/L (ref 0–32)
AST: 15 IU/L (ref 0–40)
Anion Gap: 15 mmol/L (ref 10.0–18.0)
BILIRUBIN TOTAL: 0.2 mg/dL (ref 0.0–1.2)
BUN/Creatinine Ratio: 14 (ref 9–23)
BUN: 13 mg/dL (ref 6–24)
CO2: 23 mmol/L (ref 20–29)
Calcium: 9 mg/dL (ref 8.7–10.2)
Chloride: 104 mmol/L (ref 96–106)
Creatinine, Ser: 0.94 mg/dL (ref 0.57–1.00)
GFR calc Af Amer: 78 mL/min/{1.73_m2} (ref >59)
GFR calc non Af Amer: 68 mL/min/{1.73_m2} (ref >59)
Globulin, Total: 2.1 g/dL (ref 1.5–4.5)
Glucose: 83 mg/dL (ref 65–99)
POTASSIUM: 3.9 mmol/L (ref 3.5–5.2)
SODIUM: 142 mmol/L (ref 134–144)
Total Protein: 6.5 g/dL (ref 6.0–8.5)

## 2017-03-31 MED ORDER — OSELTAMIVIR PHOSPHATE 75 MG PO CAPS: 75.0000 mg | ORAL_CAPSULE | Freq: Two times a day (BID) | ORAL | 0 refills | Status: DC

## 2017-03-31 NOTE — Telephone Encounter (Signed)
   Reason for call:   I received a call from [name] at 4 PM indicating new symptoms.   Pertinent Data:   Patient with h/o MS on immunosuppression.   Patient was seen yesterday in clinic for presumed viral GI illness - had sx of n/v/d for which she was treated symptomatically with antiemetics and advised to increase fluid intake. CMP yesterday showed normal renal function.  She states that today she has had on/off subjective fevers and chills, new sneezing, mild cough intermittently productive of scant sputum, and some dizziness when moving around a lot (not like her MS symptoms).  She states she has had no further vomiting or diarrhea and has been sipping on water regularly - if she drinks more at a time it makes her nauseated.   She reports 2 influenza contacts recently (first one was child who had flu one week prior to her contact, and second was her friend who had flu and was on treatment for a couple of days)   Assessment / Plan / Recommendations:   Patient with s/s consistent with influenza. It is concerning that she is having some dizziness which could indicate dehydration.   Patient advised to go to urgent care for eval which she is hesitant about due to not wanting further sick exposures with her immunocompromise.  Will send in tamiflu 75mg  BID x5d to her pharmacy; patient advised to get evaluated, if she does not than to make appt with [facility] on Monday  She was advised to seek care earlier if symptoms worsen.  Patient in agreement with plan.  As always, pt is advised that if symptoms worsen or new symptoms arise, they should go to an urgent care facility or to to ER for further evaluation.   Alphonzo Grieve, MD   03/31/2017, 4:02 PM

## 2017-04-03 NOTE — Progress Notes (Signed)
Internal Medicine Clinic Attending  Case discussed with Dr. Wallace at the time of the visit.  We reviewed the resident's history and exam and pertinent patient test results.  I agree with the assessment, diagnosis, and plan of care documented in the resident's note.  

## 2017-04-04 ENCOUNTER — Ambulatory Visit: Payer: BLUE CROSS/BLUE SHIELD

## 2017-04-05 ENCOUNTER — Other Ambulatory Visit: Payer: Self-pay

## 2017-04-05 ENCOUNTER — Ambulatory Visit (INDEPENDENT_AMBULATORY_CARE_PROVIDER_SITE_OTHER): Payer: BLUE CROSS/BLUE SHIELD | Admitting: Internal Medicine

## 2017-04-05 ENCOUNTER — Ambulatory Visit: Payer: BLUE CROSS/BLUE SHIELD

## 2017-04-05 VITALS — BP 129/61 | HR 72 | Temp 98.0°F | Wt 224.5 lb

## 2017-04-05 DIAGNOSIS — J069 Acute upper respiratory infection, unspecified: Secondary | ICD-10-CM | POA: Diagnosis not present

## 2017-04-05 DIAGNOSIS — Z79891 Long term (current) use of opiate analgesic: Secondary | ICD-10-CM

## 2017-04-05 DIAGNOSIS — Z79899 Other long term (current) drug therapy: Secondary | ICD-10-CM | POA: Diagnosis not present

## 2017-04-05 DIAGNOSIS — D899 Disorder involving the immune mechanism, unspecified: Secondary | ICD-10-CM | POA: Diagnosis not present

## 2017-04-05 DIAGNOSIS — Z791 Long term (current) use of non-steroidal anti-inflammatories (NSAID): Secondary | ICD-10-CM

## 2017-04-05 DIAGNOSIS — B9789 Other viral agents as the cause of diseases classified elsewhere: Secondary | ICD-10-CM

## 2017-04-05 DIAGNOSIS — G35 Multiple sclerosis: Secondary | ICD-10-CM | POA: Diagnosis not present

## 2017-04-05 DIAGNOSIS — M792 Neuralgia and neuritis, unspecified: Secondary | ICD-10-CM

## 2017-04-05 MED ORDER — IBUPROFEN 800 MG PO TABS: 800.0000 mg | ORAL_TABLET | Freq: Three times a day (TID) | ORAL | 0 refills | Status: DC | PRN

## 2017-04-05 NOTE — Patient Instructions (Addendum)
Thank you for allowing [place] to care for you  For your cough and recent flu like symptoms - Continue over the counter medications for symptoms - Continue to stay well hydrated - Finish your last dose of tamiflu  We have provided you with a prescription for Ibuprofen 800mg  to be taken 2-3 times per week for pain falirs  If your symptoms do not continue to improve over the next week or worsen, please return to clinic for reevaluation.

## 2017-04-05 NOTE — Progress Notes (Signed)
   CC: Cough  HPI:   [name] is a 57 y.o. F with PMHx listed below presenting for Cough. Please see the A&P for the status of the patient's chronic medical problems.  Patient reports 5 days cough. She called the on call resident on 3/2 with concern for subjective fevers, sneezing, cough, and some intermittent dizziness. She endorsed to sick contacts who had the flu. She did not want to go to urgent care and be exposed to sick contacts as she has MS and is immunosuppressed. She was prescribed Tamiflu 75mg  BID x 5d, told to drink plenty of fluids and instructed to follow up.  Today she report some sputum production for the past 3 days. She describes the sputum as green with some red specs. She states the sputum production is already staring to lessen over the past couple of days and her cough while persistent is less frequent and improving overall as well. She denies fevers, chills, nausea.  Past Medical History:  Diagnosis Date  . Anemia   . Bowel obstruction ([place])   . GERD (gastroesophageal reflux disease)   . IBS (irritable bowel syndrome)    at age of 9  . Multiple sclerosis ([place])   . Sciatica 2009  . Vision abnormalities    Review of Systems:  Performed and all others negative.   Physical Exam:  Vitals:   04/05/17 1617  BP: 129/61  Pulse: 72  Temp: 98 F (36.7 C)  TempSrc: Oral  SpO2: 100%  Weight: 224 lb 8 oz (101.8 kg)   Physical Exam  Constitutional: She appears well-developed and well-nourished.  Obese Female  HENT:  Mouth/Throat: Oropharynx is clear and moist.  Cardiovascular: Normal rate, regular rhythm and intact distal pulses.  Pulmonary/Chest: Effort normal and breath sounds normal. No respiratory distress.  Abdominal: Soft. Bowel sounds are normal. She exhibits no distension. There is no tenderness.  Skin: Skin is warm and dry.   Assessment & Plan:   See Encounters Tab for problem based charting.  Patient discussed with Dr. Evette Doffing

## 2017-04-06 ENCOUNTER — Encounter: Payer: Self-pay | Admitting: Internal Medicine

## 2017-04-06 NOTE — Assessment & Plan Note (Signed)
Patient requesting prescription for Ibuprofen 800mg  tablets as she states it helps with her pain to rotate this medication with her tylenol and tramadol. She states this helps her use less tramadol. Patient has good renal function. Will provided a prescription for ibuprofen 800mg  to be taken 2-3 times per week for flairs; not to be used everyday. - Ibuprofen 800mg , 2-3 times weekly, PRN Pain flairs

## 2017-04-06 NOTE — Assessment & Plan Note (Addendum)
Patient reports 5 days cough. She called the on call resident on 3/2 with concern for subjective fevers, sneezing, cough, and some intermittent dizziness. She endorsed to sick contacts who had the flu. She did not want to go to urgent care and be exposed to sick contacts as she has MS and is immunosuppressed. She was prescribed Tamiflu 75mg  BID x 5d, told to drink plenty of fluids and instructed to follow up.  Today she reports some sputum production for the past 3 days. She describes the sputum as green with some red specs. She states the sputum production is already staring to lessen over the past couple of days and her cough while persistent is less frequent and improving overall as well. She denies fevers, chills, nausea.  On exam, lungs are clear and oral mucosa is clear and moist. Patient overall improving subjectively. Suspect viral URI, possibly Flu based on her reported sick contacts. - Finish final dose of Tamiflu - Continue to stay well hydrated - Continue other supportive care - Return precautions given and emphasized as patient is immunosuppresed

## 2017-04-06 NOTE — Progress Notes (Signed)
Internal Medicine Clinic Attending  Case discussed with Dr. Melvin  at the time of the visit.  We reviewed the resident's history and exam and pertinent patient test results.  I agree with the assessment, diagnosis, and plan of care documented in the resident's note.  

## 2017-04-15 ENCOUNTER — Other Ambulatory Visit: Payer: Self-pay | Admitting: Neurology

## 2017-04-20 ENCOUNTER — Encounter: Payer: Self-pay | Admitting: Internal Medicine

## 2017-04-20 ENCOUNTER — Ambulatory Visit (INDEPENDENT_AMBULATORY_CARE_PROVIDER_SITE_OTHER): Payer: BLUE CROSS/BLUE SHIELD | Admitting: Internal Medicine

## 2017-04-20 ENCOUNTER — Telehealth: Payer: Self-pay | Admitting: Internal Medicine

## 2017-04-20 VITALS — BP 137/66 | HR 91 | Temp 98.2°F | Ht 63.0 in | Wt 222.5 lb

## 2017-04-20 DIAGNOSIS — J3489 Other specified disorders of nose and nasal sinuses: Secondary | ICD-10-CM | POA: Diagnosis not present

## 2017-04-20 DIAGNOSIS — G35 Multiple sclerosis: Secondary | ICD-10-CM

## 2017-04-20 DIAGNOSIS — J029 Acute pharyngitis, unspecified: Secondary | ICD-10-CM | POA: Diagnosis not present

## 2017-04-20 MED ORDER — BENZONATATE 100 MG PO CAPS: 100.0000 mg | ORAL_CAPSULE | Freq: Four times a day (QID) | ORAL | 1 refills | Status: DC | PRN

## 2017-04-20 NOTE — Progress Notes (Signed)
Internal Medicine Clinic Attending  Case discussed with Dr. Helberg at the time of the visit.  We reviewed the resident's history and exam and pertinent patient test results.  I agree with the assessment, diagnosis, and plan of care documented in the resident's note.    

## 2017-04-20 NOTE — Patient Instructions (Signed)
Thank you for allowing [place] to provide your care. You should be okay to resume your MS treatment since completing your Tamiflu.    If you begin to experience any of the following symptoms please return: - Fevers - Swollen, painful neck  - Worsening cough

## 2017-04-20 NOTE — Telephone Encounter (Signed)
In error

## 2017-04-20 NOTE — Progress Notes (Signed)
   CC: sore throat  HPI:                                                    [name] is a 57 y.o. female who presented to the clinic with concerns of a sore throat. For detailed evaluation and management plan please refer to problem-based charting below.  Past Medical History:  Diagnosis Date  . Anemia   . Bowel obstruction ([place])   . GERD (gastroesophageal reflux disease)   . IBS (irritable bowel syndrome)    at age of 61  . Multiple sclerosis ([place])   . Sciatica 2009  . Vision abnormalities    Review of Systems:   Endorses subjective fevers, chills  Endorses cough  Denies N/V, abdominal pain   Physical Exam: Vitals:   04/20/17 1400  BP: 137/66  Pulse: 91  Temp: 98.2 F (36.8 C)  TempSrc: Oral  SpO2: 99%  Weight: 222 lb 8 oz (100.9 kg)  Height: 5\' 3"  (1.6 m)   General: Obese female in no acute distress HENT: Oropharynx clear without exudates or lesions. No LAD Pulm: Good air movement with no wheezing or crackles  CV: RRR, no murmurs, no rubs   Assessment & Plan:   See Encounters Tab for problem based charting.  Patient discussed with Dr. Dareen Piano

## 2017-04-20 NOTE — Assessment & Plan Note (Signed)
Patient is a 57 year old female with MS who presented the clinic with three days a sore throat. She states that at the beginning of this month she was diagnosed with influenza and treated with Tamiflu. Since completing her course of Tamiflu she is felt like her symptoms have started to resolve. She continues to have cough productive of green sputum. She is also having rhinorrhea. She states that her sore throat did start three days ago after she had exposure to someone who was diagnosed with strep throat. She is concerned that she has developed strep throat and will not be able to continue her MS treatments. She endorses subjective fevers and chills but denies nausea/vomiting, abdominal pain, myalgias, arthralgias.  On physical exam her oropharynx is clear without exudates or lesion. There is no lymphadenopathy. Tympanic membranes are clear with good leg reflex bilaterally. Bilateral nerves are erythematous but without purulent discharge noted.  At this point I feel patient's sore throat and cough productive sputum are likely related to upper airway cough syndrome. I do not see any acute indication for antibiotic therapy at this time. We will continue symptomatic management. I will send out tessalon pearls to see if this can help with her cough. If it continues to get worse we discussed sending out cough syrup with codeine.

## 2017-04-25 DIAGNOSIS — G35 Multiple sclerosis: Secondary | ICD-10-CM | POA: Diagnosis not present

## 2017-05-02 ENCOUNTER — Ambulatory Visit (INDEPENDENT_AMBULATORY_CARE_PROVIDER_SITE_OTHER): Payer: BLUE CROSS/BLUE SHIELD | Admitting: Specialist

## 2017-05-02 ENCOUNTER — Encounter (INDEPENDENT_AMBULATORY_CARE_PROVIDER_SITE_OTHER): Payer: Self-pay | Admitting: Specialist

## 2017-05-02 VITALS — BP 123/79 | HR 76 | Ht 63.0 in | Wt 222.5 lb

## 2017-05-02 DIAGNOSIS — M25562 Pain in left knee: Secondary | ICD-10-CM

## 2017-05-02 DIAGNOSIS — M238X2 Other internal derangements of left knee: Secondary | ICD-10-CM | POA: Diagnosis not present

## 2017-05-02 DIAGNOSIS — M25362 Other instability, left knee: Secondary | ICD-10-CM

## 2017-05-02 DIAGNOSIS — M25361 Other instability, right knee: Secondary | ICD-10-CM | POA: Diagnosis not present

## 2017-05-02 DIAGNOSIS — M25561 Pain in right knee: Secondary | ICD-10-CM | POA: Diagnosis not present

## 2017-05-02 NOTE — Progress Notes (Signed)
Office Visit Note   Patient: Tamara [name]           Date of Birth: May 12, 1960           MRN: [id number] Visit Date: 05/02/2017              Requested by: [name], MD [address] [place], [place] [address] PCP: [name], MD   Assessment & Plan: Visit Diagnoses:  1. Laxity of knee joint, left   2. Patellofemoral instability of both knees with pain     Plan: Knee is suffering from osteoarthritis, only real proven treatments are Weight loss, NSIADs like motrin and exercise. Well padded shoes help. Ice the knee 2-3 times a day 15-20 mins at a time. Go to Pt for strengthening  Reissued the order for the [employer] Equipment for home exercise  Follow-Up Instructions: Return in about 8 weeks (around 06/27/2017).   Orders:  Orders Placed This Encounter  Procedures  . Ambulatory referral to Physical Therapy   No orders of the defined types were placed in this encounter.     Procedures: No procedures performed   Clinical Data: No additional findings.   Subjective: Chief Complaint  Patient presents with  . Right Knee - Pain, Follow-up    57 year old female with history of left knee pain and discomfort with squatting,  Kneeling and stair climbing. History of MS. Seen previously and recommended for PT unfortunately she  Was ill with a URTI and missed her scheduled appointment. Returns today. She indicates that a therapist did see her and evaluate her and that a home exercise program would be of benefit. Ordering and sending the the equipment is unfortunately a problem according to the patient.    Review of Systems  Constitutional: Negative.   HENT: Negative.   Eyes: Negative.   Respiratory: Negative.   Cardiovascular: Negative.   Gastrointestinal: Negative.   Endocrine: Negative.   Genitourinary: Negative.   Musculoskeletal: Negative.   Skin: Negative.   Allergic/Immunologic: Negative.   Neurological: Negative.   Hematological: Negative.     Psychiatric/Behavioral: Negative.      Objective: Vital Signs: BP 123/79   Pulse 76   Ht 5\' 3"  (1.6 m)   Wt 222 lb 8 oz (100.9 kg)   BMI 39.41 kg/m   Physical Exam  Constitutional: She is oriented to person, place, and time. She appears well-developed and well-nourished.  HENT:  Head: Normocephalic and atraumatic.  Eyes: Pupils are equal, round, and reactive to light. EOM are normal.  Neck: Normal range of motion. Neck supple.  Pulmonary/Chest: Effort normal and breath sounds normal.  Abdominal: Soft. Bowel sounds are normal.  Musculoskeletal: Normal range of motion.  Neurological: She is alert and oriented to person, place, and time.  Skin: Skin is warm and dry.  Psychiatric: She has a normal mood and affect. Her behavior is normal. Judgment and thought content normal.    Left Knee Exam   Muscle Strength  The patient has normal left knee strength.  Tenderness  The patient is experiencing tenderness in the patella and patellar tendon.  Range of Motion  The patient has normal left knee ROM. Extension: 10  Flexion: 130   Tests  McMurray:  Medial - negative Lateral - negative Varus: negative Valgus: negative Lachman:  Anterior - negative    Posterior - negative Patellar apprehension: positive  Other  Erythema: absent Scars: absent Sensation: normal Pulse: present Swelling: none   Back Exam  Tenderness  The patient is experiencing tenderness in the lumbar.  Range of Motion  Extension: normal  Flexion: normal  Lateral bend right: normal  Lateral bend left: normal  Rotation right: normal  Rotation left: normal   Muscle Strength  Right Quadriceps:  5/5  Left Quadriceps:  5/5  Right Hamstrings:  5/5  Left Hamstrings:  5/5   Tests  Straight leg raise right: negative Straight leg raise left: negative  Reflexes  Babinski's sign: normal   Other  Toe walk: normal Heel walk: normal Sensation: normal Gait: normal  Erythema: no back  redness Scars: absent      Specialty Comments:  No specialty comments available.  Imaging: No results found.   PMFS History: Patient Active Problem List   Diagnosis Date Noted  . Sore throat 04/20/2017  . Gastroenteritis 03/30/2017  . Morbid obesity ([place]) 12/18/2016  . Viral URI with cough 12/08/2016  . High risk medication use 07/04/2016  . Multiple sclerosis ([place]) 06/19/2016  . Neuropathic pain   . Neurogenic bowel   . Neurogenic bladder   . Anemia of chronic disease   . Transverse myelitis ([place]) 06/14/2016  . Allergic rhinitis 04/12/2016  . Sciatica associated with disorder of lumbosacral spine 01/15/2014  . GERD (gastroesophageal reflux disease) 07/21/2013  . RBBB on EKG 08/01/2012  . IBS (irritable bowel syndrome) 06/19/2012  . Healthcare maintenance 06/19/2012   Past Medical History:  Diagnosis Date  . Anemia   . Bowel obstruction ([place])   . GERD (gastroesophageal reflux disease)   . IBS (irritable bowel syndrome)    at age of 65  . Multiple sclerosis ([place])   . Sciatica 2009  . Vision abnormalities     Family History  Problem Relation Age of Onset  . Cancer Father        Gallbladder  . Gallbladder disease Father   . Hypertension Brother   . Gallbladder disease Paternal Grandmother   . Colon cancer Neg Hx   . Colon polyps Neg Hx   . Esophageal cancer Neg Hx   . Kidney disease Neg Hx     Past Surgical History:  Procedure Laterality Date  . ABDOMINAL HYSTERECTOMY  2008   cervix and right ovary still intact  . KNEE ARTHROSCOPY  2010 and 2011   Left knee, x2   Social History   Occupational History  . Occupation: Armed forces technical officer  Tobacco Use  . Smoking status: Never Smoker  . Smokeless tobacco: Never Used  Substance and Sexual Activity  . Alcohol use: No    Alcohol/week: 0.0 oz  . Drug use: No  . Sexual activity: Not on file

## 2017-05-02 NOTE — Patient Instructions (Signed)
  Knee is suffering from osteoarthritis, only real proven treatments are Weight loss, NSIADs like motrin and exercise. Well padded shoes help. Ice the knee 2-3 times a day 15-20 mins at a time. Go to Pt for strengthening  Reissued the order for the [employer] Equipment for home exercise.

## 2017-05-10 ENCOUNTER — Telehealth (INDEPENDENT_AMBULATORY_CARE_PROVIDER_SITE_OTHER): Payer: Self-pay | Admitting: Specialist

## 2017-05-10 NOTE — Telephone Encounter (Signed)
Patient called advised the insurance company need prior auth. For the [place] before [employer] will approve her getting it. The number to contact patient is [phone]    The reference # for [employer] is [id number]

## 2017-05-15 NOTE — Telephone Encounter (Signed)
I called insurance they need a CPT code for this and we do not have one for it. The reference number for [employer] is not one that her carrier uses.  I have tried to call the patient but there has been no answer.  I will try to call her again later, I need to know what number and to where she called.

## 2017-05-18 ENCOUNTER — Telehealth: Payer: Self-pay | Admitting: Internal Medicine

## 2017-05-19 NOTE — Telephone Encounter (Signed)
Chart opened by error

## 2017-05-23 ENCOUNTER — Telehealth: Payer: Self-pay | Admitting: Neurology

## 2017-05-23 DIAGNOSIS — G35 Multiple sclerosis: Secondary | ICD-10-CM | POA: Diagnosis not present

## 2017-05-23 NOTE — Telephone Encounter (Signed)
Pt. had Tysabri infusion today. C/O some nausea, h/a; sts. she thinks Ty was infused a little faster than nml.  She is alert and oriented to person, place, time, situation.  Speech is clear, deliberate.  She answers questions appropriately.  Skin is warm, dry, pink. BP is 159/96.  She is concerned that she doesn't think BP has ever been high before.  I have explained that BP fluctuates with activity, emotional state, diet, pain, etc.   As she is a little anxious, with h/a, one isolated BP of 159/96 is not concerning, but if higher readings become a pattern, she would f/u with pcp to discuss.  I have advised Tylenol/Ibuprofen, rest for h/a.  She verbalized understanding of same.  RAS aware and agreeable/fim

## 2017-05-23 NOTE — Telephone Encounter (Signed)
Patient requesting call back from nurse regarding what she can take or headache. Had infusion today (in lobby presently waiting for ride) and still has severe headache. Best call back [phone]

## 2017-05-23 NOTE — Telephone Encounter (Signed)
Patient called after hours call line for HA. Reports, never had headache like this.  She reports, she received infusion for her MS today (I believe, Tysabri). She was advised, since this is a new and severe HA, she may need to be seen in the ER, as she may have a reaction, new migraine, severe sinus HA, amongst other possibilities. She reports, that she attributes the HA to getting the infusion sooner than scheduled and quicker, than typical (I am not sure, how much quicker she got the infusion or how much sooner than scheduled) and not by her usual nurse, Tine, but another nurse. Before I could determine, what else I could help her with, she stated, she would rather wait till the morning and hung up the phone. I am not sure, what other advise I could have given her. Dr. Felecia Shelling and Faith, please review and call patient for additional advice/info, if feasible. After pt hung up, I also reviewed earlier ph conv, as documented by Faith.

## 2017-05-24 ENCOUNTER — Telehealth: Payer: Self-pay | Admitting: Neurology

## 2017-05-24 NOTE — Telephone Encounter (Signed)
Pt called earlier this morning demanding to speak with management. Pt said she "needed to speak with management at the office before she went any higher". I asked her what area of the clinic was the concern so I could find out who she should speak to. Pt said she has never had a problem with Tina B in the infusion area or Dr Felecia Shelling. I asked what happened. Pt did not give a name as to who she was referring to that she had a problem with. Pt kept demanding to speak with someone in management. I finally told her I would have Megan call her back and she seemed to satisfied with this. Pt is expecting a call back today.

## 2017-05-24 NOTE — Telephone Encounter (Signed)
Pt returned Dr Garth Bigness call. Pt said she is not feeling well and will be at the home number the rest of the day

## 2017-05-24 NOTE — Telephone Encounter (Signed)
Shortly after previous phone call during which she on patient sent a message through the call service stating: "spoke to on-call, said if on-call isn't going to listen needs another doctor to call back".  Shortly after this I received another phone service message stating: Severe headache, please advise what she can take-caller has meds at her home".  I called patient back and she proceeded to apologize for waking me up earlier. I reassured her that I had not been sleeping. She was advised regarding the initial recommendation regarding headache management that she had received while in clinic around 5 PM. I asked her what she had taken thus far for her headache. Patient did not immediately answer that question but went on to state similar to what she had said before that she had a different nurse who was in a hurry to leave, she had received her Tysabri much faster than she was used to, she felt that everyone was refreshing. Nevertheless, I asked her what she had taken for her headache, she reported that she had taken Tylenol 650 mg strength. She had not taken ibuprofen as previously advised. She said that she did not want to overdose. I assured her that if she has a prescription for ibuprofen it was safe for her to take it. She did not know of 800 mg was too much. She she had other complaints regarding her management. She proceeded to tell me that she had never been treated like this before. She had several grievances which I listened to: She reported that she felt that Dr. Garth Bigness nurse was in a rush and "accused" her of having high blood pressure. She stated that everyone was in a hurry to leave the clinic and she has never had high blood pressure before. She proceeded to tell me that she has 16 years of college education, she has a nursing background, she is "not crazy" and she is "not stupid". I tried to redirect her back to the issue at hand of her headache. She reported that she is afraid to lay down and  feels that something will burst in her head. I suggested that she should go to the emergency room if she is too afraid. She stated that she is too weak to go to the ER. She was advised that she could call 911.  She proceeded to say that she is the patient and that she needs to be listened to. After listening to her for several minutes, I advised her that she could also break the 800 mg ibuprofen in half and take 400 mg. She stated that doctors "like you" (meaning me) were the reasons why patients overdosed, and that it was unethical, unethical (I believe, she repeated it 3 times). At that point, I suggested to her that she could call back during the day, but by then she had hung up on me - again.

## 2017-05-24 NOTE — Telephone Encounter (Signed)
I spoke to [place].  After her Tysabri infusion yesterday she had nausea and headache.  She also had increased blood pressure (she does not have hypertension).  She was upset about her infusion and feels it was was rushed and more uncomfortable than usual.  She spoke to Dr. De Nurse last night.  This morning the headache was a little bit better but was still bothering her.  By noon the headache had resolved.  She currently feels at her baseline.

## 2017-05-24 NOTE — Telephone Encounter (Signed)
I tried to reach Mrs.Rosales at her home phone [phone]) and also at her mobile [phone]).  There was no answer on the home phone and the voicemail mailbox was full on the mobile phone.  I will try to reach her again later today.

## 2017-05-30 ENCOUNTER — Other Ambulatory Visit: Payer: Self-pay | Admitting: Neurology

## 2017-05-30 NOTE — Telephone Encounter (Signed)
I have tried to call the patient and I am unable to leave a message, it asks for a remote access number, if she calls back I need the number to [place] that she called and a CPT code to this equipment she is needing, otherwise insurance will not cover it.

## 2017-06-19 NOTE — Telephone Encounter (Signed)
Patient called back and said that she does not have information such as CPT code but she has spoken with both [employer] and [employer] said they would supply machine and [employer]BS would cover it through insurance, [employer] [employer] [facility] just needs pre auth from Dr. Louanne Skye. If any questions patient says she can be reached best through email?  [email or web address]  # [phone]

## 2017-06-19 NOTE — Telephone Encounter (Signed)
Patient called back and gave the following information from [employer]:  [place]. Phone # [phone] Ref # [id number]  Patient stated they would like a call from Dr. Louanne Skye or the authorization department today if possible

## 2017-06-20 ENCOUNTER — Telehealth (INDEPENDENT_AMBULATORY_CARE_PROVIDER_SITE_OTHER): Payer: Self-pay | Admitting: Specialist

## 2017-06-20 NOTE — Telephone Encounter (Signed)
Patient called advised [employer] needed an [place]. From Dr. Louanne Skye for her to get the total gym for (PT) in her home. The number to contact [place] is [phone] ([place])  Reference [id number]   Ask for Carma Lair. The number to contact patient is [phone]

## 2017-06-22 DIAGNOSIS — G35 Multiple sclerosis: Secondary | ICD-10-CM | POA: Diagnosis not present

## 2017-06-22 NOTE — Telephone Encounter (Signed)
Patient called me on a 3 way call with [employer] benefits dept, we all get on the phone the PA dept, they state to pt, benefits dept and I that they need a CPT code for the equipment.  There is not one, after 45 mins on the phone and after talking to supervisor we determine that the vender, [employer], needs to contact [employer] benefits dept to get to next step, patient will keep me posted on next step and the process.

## 2017-06-22 NOTE — Telephone Encounter (Signed)
I called to see if I could get auth, however there NO CPT code to the [place] that she is wanting.  Therefore [employer] can NOT do the auth for this. I called and advised patient of this, states that she spoke with them and was told that we had to call them and talk to them to get it approved.   I tried to advise that if there is NO CPT code that I could not get it auth'd.  She is going to have someone from [facility] to call us back.

## 2017-06-22 NOTE — Telephone Encounter (Signed)
I called to see if I could get auth, however there NO CPT code to the [place] that she is wanting.  Therefore [employer] can NOT do the auth for this. I called and advised patient of this, states that she spoke with them and was told that we had to call them and talk to them to get it approved.   I tried to advise that if there is NO CPT code that I could not get it auth'd.  She is going to have someone from [facility] to call us back.

## 2017-06-27 ENCOUNTER — Ambulatory Visit (INDEPENDENT_AMBULATORY_CARE_PROVIDER_SITE_OTHER): Payer: BLUE CROSS/BLUE SHIELD | Admitting: Specialist

## 2017-06-27 ENCOUNTER — Encounter (INDEPENDENT_AMBULATORY_CARE_PROVIDER_SITE_OTHER): Payer: Self-pay | Admitting: Specialist

## 2017-06-27 VITALS — BP 114/76 | HR 79 | Ht 63.0 in | Wt 222.5 lb

## 2017-06-27 DIAGNOSIS — G35 Multiple sclerosis: Secondary | ICD-10-CM

## 2017-06-27 DIAGNOSIS — M25561 Pain in right knee: Secondary | ICD-10-CM | POA: Diagnosis not present

## 2017-06-27 DIAGNOSIS — G8929 Other chronic pain: Secondary | ICD-10-CM

## 2017-06-27 DIAGNOSIS — M21862 Other specified acquired deformities of left lower leg: Secondary | ICD-10-CM | POA: Diagnosis not present

## 2017-06-27 DIAGNOSIS — M25562 Pain in left knee: Secondary | ICD-10-CM | POA: Diagnosis not present

## 2017-06-27 DIAGNOSIS — M21861 Other specified acquired deformities of right lower leg: Secondary | ICD-10-CM

## 2017-06-27 DIAGNOSIS — M222X2 Patellofemoral disorders, left knee: Secondary | ICD-10-CM | POA: Diagnosis not present

## 2017-06-27 DIAGNOSIS — M222X1 Patellofemoral disorders, right knee: Secondary | ICD-10-CM | POA: Diagnosis not present

## 2017-06-27 NOTE — Patient Instructions (Addendum)
  Knee is suffering from osteoarthritis, only real proven treatments are Weight loss, NSIADs like diclofenac and exercise. Well padded shoes help. Ice the knee 2-3 times a day 15-20 mins at a time. Terminal quadriceps strengthening exercises are recommended chronically.. Referral to Dr. Marlou Sa for assessment of her knees and determine if she is a surgical candidate for arthroscopic shaving or  Patellofemoral arthroplasty.

## 2017-06-27 NOTE — Progress Notes (Signed)
Office Visit Note   Patient: Tamara [name]           Date of Birth: 10-06-1960           MRN: [id number] Visit Date: 06/27/2017              Requested by: Tamara Noss, MD [address] [place], [place] [address] PCP: Tamara Noss, MD   Assessment & Plan: Visit Diagnoses:  1. Multiple sclerosis (HCC)   2. Patellofemoral pain syndrome of both knees   3. Acquired bilateral genu recurvatum   57 year old female with multiple sclerosis, bilateral knee anterior pain. Clinically laxity of both knees with grating at the P-F joint. She had arthroscopy by Dr. Maxie [name] in 10/2008. I do not have anything more that I can offer her in terms of conservative management, she does not want injections as this has not helped in the past. The left great toe has occhoniomycosis or may be thickened due to genetic tendency to nail change. Ridging of the finger nails and toe nails is noted, she has had a history of seborrheic keratosis of the scalp with can also be associated with a psoaritic cause for a nail condition. The oral antifungal agent commonly used for nail fungal infections unfortunately has risks of hepatic toxicity and requires use for 3-4 months To allow for a normal nail without fungus to grow. She does not want to persuit oral Antifungal meds. I will order MRI of the knees and have her follow up with Dr. Marlou [name] To assess whether she is a candidate for a patellofemoral arthroplasty of patellofemoral procedure. Continue with motrin, capsaicin cream and voltaren gel.   Plan: Knee is suffering from osteoarthritis, only real proven treatments are Weight loss, NSIADs like diclofenac and exercise. Well padded shoes help. Ice the knee 2-3 times a day 15-20 mins at a time. Terminal quadriceps strengthening exercises are recommended chronically.. Referral to Dr. Marlou [name] for assessment of her knees and determine if she is a surgical candidate for arthroscopic shaving or  Patellofemoral arthroplasty  Follow-Up  Instructions: Return in about 3 weeks (around 07/18/2017).   Orders:  No orders of the defined types were placed in this encounter.  No orders of the defined types were placed in this encounter.     Procedures: No procedures performed   Clinical Data: Findings:  Previous radiographs from 03/2017  AP knees and lateral right knee with left knee lateral joint line margin osteophytes that are small. The lateral and medial  Joint lines both knees are well maintained. Lateral radiograph of the right knee with mild irregular patella posterior joint line with small superior pole osteophyte.    Subjective: Chief Complaint  Patient presents with  . Left Knee - Pain  . Right Knee - Pain    57  year old female referred for evaluation of her knees with pain with squatting, kneeling and standing and walking. She has had numerous knee injections in the past. She also today complains of left foot nail changes and requests evaluation of the left great toe nail. She is having more bilateral  Knee pain and has undergone PT without help and has been performing terminal quadriceps strengthening exercises. She is utilizing capsaicin  Cream and NSAIDs yet her pain persists. She has had a previous left knee arthroscopic surgery by Dr. Georgana [name] 11/02/2008 and reports improvement in the left knee. Now she is seen with continued right knee pain despite attempts at conservative management. She also reports  that She has not been able to obtain approval for a [employer] that she is requesting for her home via her insurance. I have twice given information about This equipment and I am not sure what else I can do to assist in the obtaining of this equipment. Her insurance company is her insurance and it Sounds as though they are not willing to pay for such equipment.    Review of Systems  Constitutional: Negative.   HENT: Negative.   Eyes: Negative.   Respiratory: Negative.   Cardiovascular: Negative.     Gastrointestinal: Negative.   Endocrine: Negative.   Genitourinary: Negative.   Musculoskeletal: Negative.   Skin: Negative.   Allergic/Immunologic: Negative.   Neurological: Negative.   Hematological: Negative.   Psychiatric/Behavioral: Negative.      Objective: Vital Signs: BP 114/76   Pulse 79   Ht 5\' 3"  (1.6 m)   Wt 222 lb 8 oz (100.9 kg)   BMI 39.41 kg/m   Physical Exam  Constitutional: She is oriented to person, place, and time. She appears well-developed and well-nourished.  HENT:  Head: Normocephalic and atraumatic.  Eyes: Pupils are equal, round, and reactive to light. EOM are normal.  Neck: Normal range of motion. Neck supple.  Pulmonary/Chest: Effort normal and breath sounds normal.  Abdominal: Soft. Bowel sounds are normal.  Musculoskeletal: Normal range of motion.  Neurological: She is alert and oriented to person, place, and time.  Skin: Skin is warm and dry.  Psychiatric: She has a normal mood and affect. Her behavior is normal. Judgment and thought content normal.    Right Knee Exam  Right knee exam is normal.  Tenderness  The patient is experiencing tenderness in the patella and patellar tendon.  Range of Motion  Extension: -10  Flexion: 140   Other  Erythema: absent Scars: absent  Comments:  Grating of the knee patellofemoral joint.   Left Knee Exam  Left knee exam is normal.  Tenderness  The patient is experiencing tenderness in the patella and patellar tendon.  Range of Motion  Extension: -10  Flexion: 140   Other  Erythema: absent Scars: absent  Comments:  Grating of the knee patellofemoral joint.      Specialty Comments:  No specialty comments available.  Imaging: No results found.   PMFS History: Patient Active Problem List   Diagnosis Date Noted  . Sore throat 04/20/2017  . Gastroenteritis 03/30/2017  . Morbid obesity ([place]) 12/18/2016  . Viral URI with cough 12/08/2016  . High risk medication use 07/04/2016   . Multiple sclerosis ([place]) 06/19/2016  . Neuropathic pain   . Neurogenic bowel   . Neurogenic bladder   . Anemia of chronic disease   . Transverse myelitis ([place]) 06/14/2016  . Allergic rhinitis 04/12/2016  . Sciatica associated with disorder of lumbosacral spine 01/15/2014  . GERD (gastroesophageal reflux disease) 07/21/2013  . RBBB on EKG 08/01/2012  . IBS (irritable bowel syndrome) 06/19/2012  . Healthcare maintenance 06/19/2012   Past Medical History:  Diagnosis Date  . Anemia   . Bowel obstruction ([place])   . GERD (gastroesophageal reflux disease)   . IBS (irritable bowel syndrome)    at age of 77  . Multiple sclerosis ([place])   . Sciatica 2009  . Vision abnormalities     Family History  Problem Relation Age of Onset  . Cancer Father        Gallbladder  . Gallbladder disease Father   . Hypertension Brother   .  Gallbladder disease Paternal Grandmother   . Colon cancer Neg Hx   . Colon polyps Neg Hx   . Esophageal cancer Neg Hx   . Kidney disease Neg Hx     Past Surgical History:  Procedure Laterality Date  . ABDOMINAL HYSTERECTOMY  2008   cervix and right ovary still intact  . KNEE ARTHROSCOPY  2010 and 2011   Left knee, x2   Social History   Occupational History  . Occupation: Armed forces technical officer  Tobacco Use  . Smoking status: Never Smoker  . Smokeless tobacco: Never Used  Substance and Sexual Activity  . Alcohol use: No    Alcohol/week: 0.0 oz  . Drug use: No  . Sexual activity: Not on file

## 2017-06-28 ENCOUNTER — Encounter (INDEPENDENT_AMBULATORY_CARE_PROVIDER_SITE_OTHER): Payer: Self-pay | Admitting: *Deleted

## 2017-07-02 NOTE — Telephone Encounter (Signed)
ERROR

## 2017-07-18 ENCOUNTER — Ambulatory Visit (INDEPENDENT_AMBULATORY_CARE_PROVIDER_SITE_OTHER): Payer: BLUE CROSS/BLUE SHIELD | Admitting: Orthopedic Surgery

## 2017-07-19 ENCOUNTER — Ambulatory Visit (INDEPENDENT_AMBULATORY_CARE_PROVIDER_SITE_OTHER): Payer: BLUE CROSS/BLUE SHIELD | Admitting: Orthopedic Surgery

## 2017-07-20 ENCOUNTER — Other Ambulatory Visit: Payer: Self-pay | Admitting: Internal Medicine

## 2017-07-20 DIAGNOSIS — M792 Neuralgia and neuritis, unspecified: Secondary | ICD-10-CM

## 2017-07-23 ENCOUNTER — Encounter: Payer: Self-pay | Admitting: *Deleted

## 2017-07-23 NOTE — Telephone Encounter (Signed)
Next appt scheduled  Tomorrow w/PCP. 

## 2017-07-24 ENCOUNTER — Encounter: Payer: Self-pay | Admitting: Internal Medicine

## 2017-07-24 ENCOUNTER — Other Ambulatory Visit: Payer: Self-pay

## 2017-07-24 ENCOUNTER — Ambulatory Visit (INDEPENDENT_AMBULATORY_CARE_PROVIDER_SITE_OTHER): Payer: BLUE CROSS/BLUE SHIELD | Admitting: Internal Medicine

## 2017-07-24 VITALS — BP 136/77 | HR 79 | Temp 98.1°F | Ht 63.0 in | Wt 222.6 lb

## 2017-07-24 DIAGNOSIS — K592 Neurogenic bowel, not elsewhere classified: Secondary | ICD-10-CM

## 2017-07-24 DIAGNOSIS — Z23 Encounter for immunization: Secondary | ICD-10-CM | POA: Diagnosis not present

## 2017-07-24 DIAGNOSIS — G939 Disorder of brain, unspecified: Secondary | ICD-10-CM | POA: Diagnosis not present

## 2017-07-24 DIAGNOSIS — R0600 Dyspnea, unspecified: Secondary | ICD-10-CM | POA: Diagnosis not present

## 2017-07-24 DIAGNOSIS — K219 Gastro-esophageal reflux disease without esophagitis: Secondary | ICD-10-CM | POA: Diagnosis not present

## 2017-07-24 DIAGNOSIS — G35 Multiple sclerosis: Secondary | ICD-10-CM | POA: Diagnosis not present

## 2017-07-24 DIAGNOSIS — N319 Neuromuscular dysfunction of bladder, unspecified: Secondary | ICD-10-CM | POA: Diagnosis not present

## 2017-07-24 DIAGNOSIS — R0609 Other forms of dyspnea: Secondary | ICD-10-CM | POA: Diagnosis not present

## 2017-07-24 DIAGNOSIS — K589 Irritable bowel syndrome without diarrhea: Secondary | ICD-10-CM | POA: Diagnosis not present

## 2017-07-24 DIAGNOSIS — G959 Disease of spinal cord, unspecified: Secondary | ICD-10-CM

## 2017-07-24 DIAGNOSIS — K08439 Partial loss of teeth due to caries, unspecified class: Secondary | ICD-10-CM

## 2017-07-24 DIAGNOSIS — M5440 Lumbago with sciatica, unspecified side: Secondary | ICD-10-CM | POA: Diagnosis not present

## 2017-07-24 DIAGNOSIS — R011 Cardiac murmur, unspecified: Secondary | ICD-10-CM

## 2017-07-24 DIAGNOSIS — K029 Dental caries, unspecified: Secondary | ICD-10-CM

## 2017-07-24 DIAGNOSIS — D638 Anemia in other chronic diseases classified elsewhere: Secondary | ICD-10-CM | POA: Diagnosis not present

## 2017-07-24 DIAGNOSIS — J309 Allergic rhinitis, unspecified: Secondary | ICD-10-CM

## 2017-07-24 LAB — BRAIN NATRIURETIC PEPTIDE: B Natriuretic Peptide: 34.1 pg/mL (ref 0.0–100.0)

## 2017-07-24 NOTE — Patient Instructions (Addendum)
Thank you for coming to the clinic today. It was a pleasure to see you.   For your leg swelling and difficulty breathing with walking, I have ordered an ultrasound of your heart. If you have not heart back about scheduling this, please call our office within the next two weeks please call our office.   Dont forget to call your insurance company to find out the best way to go and see a dentist. Call our office if you develop fevers, chills or swelling/ fluid drainage in that are of your teeth.   FOLLOW-UP INSTRUCTIONS When: as needed with Dr. Hetty Ely   For: recheck of your blood pressure  What to bring: all of your medication bottles   Please call our clinic if you have any questions or concerns, we may be able to help and keep you from a long and expensive emergency room wait. Our clinic and after hours phone number is [phone], the best time to call is Monday through Friday 9 am to 4 pm but there is always someone available 24/7 if you have an emergency. If you need medication refills please notify your pharmacy one week in advance and they will send [place] a request.

## 2017-07-24 NOTE — Progress Notes (Signed)
   CC: dental pain   HPI:  Tamara [name] is a 57 y.o. with PMH multiple sclerosis with lesions in the brain and cervical spine neurogenic bladder and bowel, IBS, anemia of chronic disease, GERD, lumbosacral sciatica, and allergic rhinitis who presents for follow up of dental pain, and lower extremity edema. Please see the assessment and plans for the status of the patient chronic medical problems.   Past Medical History:  Diagnosis Date  . Anemia   . Bowel obstruction ([place])   . GERD (gastroesophageal reflux disease)   . IBS (irritable bowel syndrome)    at age of 27  . Multiple sclerosis ([place])   . Sciatica 2009  . Vision abnormalities    Review of Systems:  Refer to history of present illness and assessment and plans for pertinent review of systems, all others reviewed and negative  Physical Exam:  Vitals:   07/24/17 1335  BP: 136/77  Pulse: 79  Temp: 98.1 F (36.7 C)  TempSrc: Oral  SpO2: 100%  Weight: 222 lb 9.6 oz (101 kg)   General: Well-appearing, no acute distress HEENT: There is a canine in the anterior right mouth that is cracked with a jagged, there is a whole visible in the center of the tooth but no gum tenderness, erythema, or drainage. There are missing canines also on the right.  Cardiac: 2 out of 6 systolic murmur loudest over the right upper sternal border, normal S1 and S2, regular rate and rhythm, trace bilateral peripheral edema, JVD is not appreciated Pulmonary: Normal work of breathing, lungs clear to auscultation, no wheezes or rhonchi GI: Bowel sounds present, soft, nontender, nondistended  Assessment & Plan:   Dental pain The past year Tamara [name] has noticed increased agility and cracking of her teeth.  She notes that she had difficulties with dentition dating back prior to the past yearbut noticed that this is gotten especially worse in the past year.  She has lost a few teeth and has one tooth that is in the process of cracking and falling out. The  rest of her teeth are intact. She is in the process of finding a dentist through her insurance plan. She has no gum pain and denies fever or chills. Exam at this time is not consistent with infection or need for antibiotics at this time however there is potential for cavities and it appears that she needs to have the teeth removed.  - encouraged dentistry visit.   Dyspnea on exertion Patient describes 1 month of onset of worsening dyspnea on exertion.  Notes that she has to stop and rest after walking about 300 feet. She denies chest pain, orthopnea or paroxysmal nocturnal dyspnea however she has also noticed lower extremity edema in this timeframe.  A new systolic murmur was noticed on exam today and she does have some trace peripheral edema.  She is on medications which can be complicated by heart failure including imipramine and natalizumab. This warrants further investigation by BNP to assess whether she is in acute heart failure and Echo to rule out a concerning cause of her symptoms.  - BNP 34 unremarkable  - proceed with echo   Preventative health  - PPSV 23 today  - Due for mammogram, last was in 2014 without concern for malignancy, mammo order was placed 11/2016   See Encounters Tab for problem based charting.  Patient discussed with Dr. Heber B[place]field

## 2017-07-25 ENCOUNTER — Ambulatory Visit (INDEPENDENT_AMBULATORY_CARE_PROVIDER_SITE_OTHER): Payer: BLUE CROSS/BLUE SHIELD | Admitting: Orthopedic Surgery

## 2017-07-25 ENCOUNTER — Encounter (INDEPENDENT_AMBULATORY_CARE_PROVIDER_SITE_OTHER): Payer: Self-pay | Admitting: Orthopedic Surgery

## 2017-07-25 DIAGNOSIS — M222X1 Patellofemoral disorders, right knee: Secondary | ICD-10-CM | POA: Diagnosis not present

## 2017-07-25 DIAGNOSIS — M222X2 Patellofemoral disorders, left knee: Secondary | ICD-10-CM

## 2017-07-25 NOTE — Progress Notes (Signed)
Office Visit Note   Patient: Tamara [name]           Date of Birth: 12/21/1960           MRN: [id number] Visit Date: 07/25/2017 Requested by: [name], MD [address] [place], [place] [address] PCP: [name], MD  Subjective: Chief Complaint  Patient presents with  . Right Knee - Pain    HPI: Tamara [name] is a patient with right knee pain.  She had a cortisone injection 5 years ago which did not give her relief.  She describes swelling weakness and giving way but no locking or popping.  She has difficulty with stairs.  Radiographs from January are reviewed.  Not much in the way of joint space narrowing is present.  Patient does have multiple sclerosis.  She takes ibuprofen occasionally and tramadol about twice a week              ROS: All systems reviewed are negative as they relate to the chief complaint within the history of present illness.  Patient denies  fevers or chills.   Assessment & Plan: Visit Diagnoses:  1. Patellofemoral pain syndrome of both knees     Plan: Impression is patellofemoral pain on the right knee.  No effusion.  Patient has significant hyperextension in that right knee.  Needs MRI scan of the right knee to evaluate for patellofemoral issues.  She is not having frank instability.  MRI scan will show whether or not there is anything arthroscopically treatable in that right knee.  I will see her back after that study  Follow-Up Instructions: Return for after MRI.   Orders:  No orders of the defined types were placed in this encounter.  No orders of the defined types were placed in this encounter.     Procedures: No procedures performed   Clinical Data: No additional findings.  Objective: Vital Signs: There were no vitals taken for this visit.  Physical Exam:   Constitutional: Patient appears well-developed HEENT:  Head: Normocephalic Eyes:EOM are normal Neck: Normal range of motion Cardiovascular: Normal rate Pulmonary/chest: Effort  normal Neurologic: Patient is alert Skin: Skin is warm Psychiatric: Patient has normal mood and affect    Ortho Exam: Ortho exam demonstrates normal gait and alignment.  Patient has palpable pedal pulses.  Right knee hyperextends about 20 degrees left knee hyperextends about 10 degrees.  Collateral and cruciate ligaments are stable in both knees.  Extensor mechanism is intact.  No J sign with patellar tracking and no increased Q angle on the right or left knee.  No groin pain with internal/external rotation of the leg.  No focal joint line tenderness or patellofemoral crepitus is present.  Negative patellar apprehension on the right left-hand side.  Specialty Comments:  No specialty comments available.  Imaging: No results found.   PMFS History: Patient Active Problem List   Diagnosis Date Noted  . Morbid obesity ([place]) 12/18/2016  . High risk medication use 07/04/2016  . Multiple sclerosis ([place]) 06/19/2016  . Neurogenic bowel   . Neurogenic bladder   . Transverse myelitis ([place]) 06/14/2016  . Allergic rhinitis 04/12/2016  . Sciatica associated with disorder of lumbosacral spine 01/15/2014  . GERD (gastroesophageal reflux disease) 07/21/2013  . RBBB on EKG 08/01/2012  . IBS (irritable bowel syndrome) 06/19/2012  . Healthcare maintenance 06/19/2012   Past Medical History:  Diagnosis Date  . Anemia   . Bowel obstruction ([place])   . GERD (gastroesophageal reflux disease)   .  IBS (irritable bowel syndrome)    at age of 53  . Multiple sclerosis ([place])   . Sciatica 2009  . Vision abnormalities     Family History  Problem Relation Age of Onset  . Cancer Father        Gallbladder  . Gallbladder disease Father   . Hypertension Brother   . Gallbladder disease Paternal Grandmother   . Colon cancer Neg Hx   . Colon polyps Neg Hx   . Esophageal cancer Neg Hx   . Kidney disease Neg Hx     Past Surgical History:  Procedure Laterality Date  . ABDOMINAL HYSTERECTOMY  2008   cervix  and right ovary still intact  . KNEE ARTHROSCOPY  2010 and 2011   Left knee, x2   Social History   Occupational History  . Occupation: Armed forces technical officer  Tobacco Use  . Smoking status: Never Smoker  . Smokeless tobacco: Never Used  Substance and Sexual Activity  . Alcohol use: No    Alcohol/week: 0.0 oz  . Drug use: No  . Sexual activity: Not on file

## 2017-07-30 ENCOUNTER — Encounter: Payer: Self-pay | Admitting: Internal Medicine

## 2017-07-30 DIAGNOSIS — R0609 Other forms of dyspnea: Principal | ICD-10-CM

## 2017-07-30 DIAGNOSIS — K029 Dental caries, unspecified: Secondary | ICD-10-CM | POA: Insufficient documentation

## 2017-07-30 DIAGNOSIS — R0600 Dyspnea, unspecified: Secondary | ICD-10-CM | POA: Insufficient documentation

## 2017-07-30 DIAGNOSIS — R0602 Shortness of breath: Secondary | ICD-10-CM | POA: Insufficient documentation

## 2017-07-30 DIAGNOSIS — G35 Multiple sclerosis: Secondary | ICD-10-CM | POA: Diagnosis not present

## 2017-07-30 NOTE — Progress Notes (Signed)
Internal Medicine Clinic Attending  Case discussed with Dr. Blum at the time of the visit.  We reviewed the resident's history and exam and pertinent patient test results.  I agree with the assessment, diagnosis, and plan of care documented in the resident's note. 

## 2017-07-30 NOTE — Assessment & Plan Note (Signed)
The past year Tamara [name] has noticed increased agility and cracking of her teeth.  She notes that she had difficulties with dentition dating back prior to the past yearbut noticed that this is gotten especially worse in the past year.  She has lost a few teeth and has one tooth that is in the process of cracking and falling out. The rest of her teeth are intact. She is in the process of finding a dentist through her insurance plan. She has no gum pain and denies fever or chills. Exam at this time is not consistent with infection or need for antibiotics at this time however there is potential for cavities and it appears that she needs to have the teeth removed.  - encouraged dentistry visit.

## 2017-07-30 NOTE — Assessment & Plan Note (Signed)
Patient describes 1 month of onset of worsening dyspnea on exertion.  Notes that she has to stop and rest after walking about 300 feet. She denies chest pain, orthopnea or paroxysmal nocturnal dyspnea however she has also noticed lower extremity edema in this timeframe.  A new systolic murmur was noticed on exam today and she does have some trace peripheral edema.  She is on medications which can be complicated by heart failure including imipramine and natalizumab. This warrants further investigation by BNP to assess whether she is in acute heart failure and Echo to rule out a concerning cause of her symptoms.  - BNP 34 unremarkable  - proceed with echo

## 2017-08-06 ENCOUNTER — Encounter (INDEPENDENT_AMBULATORY_CARE_PROVIDER_SITE_OTHER): Payer: Self-pay | Admitting: *Deleted

## 2017-08-17 ENCOUNTER — Other Ambulatory Visit: Payer: Self-pay | Admitting: Internal Medicine

## 2017-08-24 ENCOUNTER — Ambulatory Visit (HOSPITAL_COMMUNITY): Admission: RE | Admit: 2017-08-24 | Payer: BLUE CROSS/BLUE SHIELD | Source: Ambulatory Visit

## 2017-08-24 ENCOUNTER — Other Ambulatory Visit: Payer: Self-pay | Admitting: Neurology

## 2017-08-24 ENCOUNTER — Other Ambulatory Visit: Payer: Self-pay | Admitting: Internal Medicine

## 2017-08-24 DIAGNOSIS — M792 Neuralgia and neuritis, unspecified: Secondary | ICD-10-CM

## 2017-08-24 NOTE — Telephone Encounter (Signed)
I received a request for a new ibuprofen prescription. I would advise Tamara [name] to use over the counter tylenol instead of ibuprofen. In patients with a history of GERD ibuprofen is not advised.

## 2017-08-27 ENCOUNTER — Ambulatory Visit (HOSPITAL_COMMUNITY)
Admission: RE | Admit: 2017-08-27 | Discharge: 2017-08-27 | Disposition: A | Discharge status: Home / Self Care | Payer: BLUE CROSS/BLUE SHIELD | Source: Ambulatory Visit | Attending: Internal Medicine | Admitting: Internal Medicine

## 2017-08-27 DIAGNOSIS — R0600 Dyspnea, unspecified: Secondary | ICD-10-CM

## 2017-08-27 DIAGNOSIS — R0609 Other forms of dyspnea: Secondary | ICD-10-CM | POA: Diagnosis not present

## 2017-08-27 DIAGNOSIS — G35 Multiple sclerosis: Secondary | ICD-10-CM | POA: Diagnosis not present

## 2017-08-27 NOTE — Telephone Encounter (Signed)
Rx registry checked. Last fill date is 06/10/17 for #60. Pt does not have a follow up visit scheduled.

## 2017-08-27 NOTE — Progress Notes (Signed)
  Echocardiogram 2D Echocardiogram has been performed.  Johny Chess 08/27/2017, 11:53 AM

## 2017-08-27 NOTE — Telephone Encounter (Addendum)
Attempted to reach patient at home and mobile numbers. No VM set up on home number and mobile VM box full and cannot accept messages at this time. [name] Hartshorn, RN, BSN

## 2017-09-02 ENCOUNTER — Ambulatory Visit
Admission: RE | Admit: 2017-09-02 | Discharge: 2017-09-02 | Disposition: A | Discharge status: Home / Self Care | Payer: BLUE CROSS/BLUE SHIELD | Source: Ambulatory Visit | Attending: Specialist | Admitting: Specialist

## 2017-09-02 DIAGNOSIS — M25561 Pain in right knee: Principal | ICD-10-CM

## 2017-09-02 DIAGNOSIS — M25562 Pain in left knee: Principal | ICD-10-CM

## 2017-09-02 DIAGNOSIS — G8929 Other chronic pain: Secondary | ICD-10-CM

## 2017-09-02 DIAGNOSIS — M23351 Other meniscus derangements, posterior horn of lateral meniscus, right knee: Secondary | ICD-10-CM | POA: Diagnosis not present

## 2017-09-02 DIAGNOSIS — M23352 Other meniscus derangements, posterior horn of lateral meniscus, left knee: Secondary | ICD-10-CM | POA: Diagnosis not present

## 2017-09-25 DIAGNOSIS — G35 Multiple sclerosis: Secondary | ICD-10-CM | POA: Diagnosis not present

## 2017-09-27 ENCOUNTER — Other Ambulatory Visit: Payer: Self-pay | Admitting: Internal Medicine

## 2017-09-27 ENCOUNTER — Other Ambulatory Visit: Payer: Self-pay | Admitting: Neurology

## 2017-09-27 DIAGNOSIS — M792 Neuralgia and neuritis, unspecified: Secondary | ICD-10-CM

## 2017-09-27 NOTE — Telephone Encounter (Signed)
Please remind Tamara [name] that Tylenol is safer as we try to protect her kidneys.

## 2017-09-27 NOTE — Telephone Encounter (Signed)
Called pt - stated she alternates Ibuprofen and Tramadol.

## 2017-09-27 NOTE — Telephone Encounter (Signed)
Pt called/ informed of Dr Fredrik Cove response - stated she only takes it if after walking and have muscle aches. Stated she alternates Tylenol, Ibuprofen and Tramadol. Stated this was discussed with her doctor at prior visit; and her ortho doctor stated it is ok to take. And she would not take anything that would hurt her body.

## 2017-10-24 ENCOUNTER — Other Ambulatory Visit: Payer: Self-pay | Admitting: Neurology

## 2017-10-26 DIAGNOSIS — G35 Multiple sclerosis: Secondary | ICD-10-CM | POA: Diagnosis not present

## 2017-10-30 ENCOUNTER — Other Ambulatory Visit: Payer: Self-pay | Admitting: Neurology

## 2017-10-30 ENCOUNTER — Encounter (INDEPENDENT_AMBULATORY_CARE_PROVIDER_SITE_OTHER): Payer: Self-pay

## 2017-10-30 ENCOUNTER — Other Ambulatory Visit: Payer: Self-pay

## 2017-10-30 ENCOUNTER — Ambulatory Visit (INDEPENDENT_AMBULATORY_CARE_PROVIDER_SITE_OTHER): Payer: BLUE CROSS/BLUE SHIELD | Admitting: Internal Medicine

## 2017-10-30 DIAGNOSIS — Z23 Encounter for immunization: Secondary | ICD-10-CM | POA: Diagnosis not present

## 2017-10-30 DIAGNOSIS — H109 Unspecified conjunctivitis: Secondary | ICD-10-CM | POA: Diagnosis not present

## 2017-10-30 DIAGNOSIS — H2511 Age-related nuclear cataract, right eye: Secondary | ICD-10-CM | POA: Diagnosis not present

## 2017-10-30 DIAGNOSIS — H2512 Age-related nuclear cataract, left eye: Secondary | ICD-10-CM | POA: Diagnosis not present

## 2017-10-30 DIAGNOSIS — G43819 Other migraine, intractable, without status migrainosus: Secondary | ICD-10-CM | POA: Diagnosis not present

## 2017-10-30 DIAGNOSIS — Z0000 Encounter for general adult medical examination without abnormal findings: Secondary | ICD-10-CM

## 2017-10-30 DIAGNOSIS — H43812 Vitreous degeneration, left eye: Secondary | ICD-10-CM | POA: Diagnosis not present

## 2017-10-30 NOTE — Patient Instructions (Signed)
FOLLOW-UP INSTRUCTIONS When: If your symptoms worsen or fail to improve For: Routine What to bring: All of your medications  Thank you for your visit to the [facility] today.  Please call your eye doctor today and schedule an appointment for as soon as possible.  If the symptoms worsen, you have worsening pain or persistent drainage or fail to improve please notify us right away or visit the local ER for pain.   Bacterial Conjunctivitis Bacterial conjunctivitis is an infection of your conjunctiva. This is the clear membrane that covers the white part of your eye and the inner surface of your eyelid. This condition can make your eye:  Red or pink.  Itchy.  This condition is caused by bacteria. This condition spreads very easily from person to person (is contagious) and from one eye to the other eye. Follow these instructions at home: Medicines  Take or apply your antibiotic medicine as told by your doctor. Do not stop taking or applying the antibiotic even if you start to feel better.  Take or apply over-the-counter and prescription medicines only as told by your doctor.  Do not touch your eyelid with the eye drop bottle or the ointment tube. Managing discomfort  Wipe any fluid from your eye with a warm, wet washcloth or a cotton ball.  Place a cool, clean washcloth on your eye. Do this for 10-20 minutes, 3-4 times per day. General instructions  Do not wear contact lenses until the irritation is gone. Wear glasses until your doctor says it is okay to wear contacts.  Do not wear eye makeup until your symptoms are gone. Throw away any old makeup.  Change or wash your pillowcase every day.  Do not share towels or washcloths with anyone.  Wash your hands often with soap and water. Use paper towels to dry your hands.  Do not touch or rub your eyes.  Do not drive or use heavy machinery if your vision is blurry. Contact a doctor if:  You have a fever.  Your symptoms do  not get better after 10 days. Get help right away if:  You have a fever and your symptoms suddenly get worse.  You have very bad pain when you move your eye.  Your face: ? Hurts. ? Is red. ? Is swollen.  You have sudden loss of vision. This information is not intended to replace advice given to you by your health care provider. Make sure you discuss any questions you have with your health care provider. Document Released: 10/26/2007 Document Revised: 06/24/2015 Document Reviewed: 10/29/2014 Elsevier Interactive Patient Education  [employer].

## 2017-10-30 NOTE — Assessment & Plan Note (Signed)
HPI:[name] is a 57 y.o. female who presents today for evaluation of her left eye 2/2 to drainage and some mild puffiness and pain associated with blurriness. She stated that the blurriness began approximately 3 days prior with mild discharge. This progressed to a soft tissue swelling beneath the eye, copious drainage, feeling that it was stuck shut with mild pain in the surrounding area. No particular note of pain in the eye. The blurriness is somewhat improved with blinking but not to baseline and not resolved. She denied recent similar illness or other symptoms such as rhinorrhea or URI. Physical exam revealed a mildly injected cornea, no particular focus of injection, no drainage was visualized and the retina could not be easily visualized. Visual acuity exam was not performed. This appears to most likely be a bacterial/viral conjunctivitis given the unilaterally and discharge but given her history of MS and the pain, I feel it appropriate to be evaluated by her ophthalmologist. In addition she has not been to see an ophthalmologist in several years or since her MS diagnosis and warrants follow-up in the next several days.  Plan: To see her Ophthalmologist today as they have an opening available. We greatly appreciate their assistance.   Warm compress for the conjunctivitis, return if this fails to improve in 2-3 days or worsens.  She was given strict return precautions.

## 2017-10-30 NOTE — Assessment & Plan Note (Signed)
  Healthcare maintenance: Referred for routine screening mammogram

## 2017-10-30 NOTE — Progress Notes (Signed)
   CC: Left eye drainage   HPI:[name] is a 57 y.o. female who presents today for evaluation of her left eye 2/2 to drainage and some mild puffiness and pain associated with blurriness. She stated that the blurriness began approximately 3 days prior with mild discharge. This progressed to a soft tissue swelling beneath the eye, copious drainage, feeling that it was stuck shut with mild pain in the surrounding area. No particular note of pain in the eye. The blurriness is somewhat improved with blinking but not to baseline and not resolved. She denied recent similar illness or other symptoms such as rhinorrhea or URI. Physical exam revealed a mildly injected cornea, no particular focus of injection, no drainage was visualized and the retina could not be easily visualized. Visual acuity exam was not performed. This appears to most likely be a bacterial/viral conjunctivitis given the unilaterally and discharge but given her history of MS and the pain, I feel it appropriate to be evaluated by her ophthalmologist. In addition she has not been to see an ophthalmologist in several years or since her MS diagnosis and warrants follow-up in the next several days.  Plan: To see her Ophthalmologist today as they have an opening available. We greatly appreciate their assistance.   Warm compress for the conjunctivitis, return if this fails to improve in 2-3 days or worsens.  She was given strict return precautions.   Healthcare maintenance: Referred for routine screening mammogram   Past Medical History:  Diagnosis Date  . Anemia   . Bowel obstruction ([place])   . GERD (gastroesophageal reflux disease)   . IBS (irritable bowel syndrome)    at age of 39  . Multiple sclerosis ([place])   . Sciatica 2009  . Vision abnormalities    Review of Systems:  ROS negative except as per HPI>  Physical Exam:  Vitals:   10/30/17 1041  BP: 136/82  Pulse: 77  Temp: 98.4 F (36.9 C)  TempSrc: Oral  SpO2: 100%    Weight: 219 lb 9.6 oz (99.6 kg)  Height: 5\' 3"  (1.6 m)   See HPI.  Assessment & Plan:   See Encounters Tab for problem based charting.  Patient discussed with Dr. Daryll Drown

## 2017-10-31 NOTE — Progress Notes (Signed)
Internal Medicine Clinic Attending  Case discussed with Dr. Harbrecht at the time of the visit.  We reviewed the resident's history and exam and pertinent patient test results.  I agree with the assessment, diagnosis, and plan of care documented in the resident's note.   

## 2017-11-29 ENCOUNTER — Telehealth: Payer: Self-pay | Admitting: Internal Medicine

## 2017-11-29 DIAGNOSIS — G35 Multiple sclerosis: Secondary | ICD-10-CM | POA: Diagnosis not present

## 2017-12-25 ENCOUNTER — Telehealth: Payer: Self-pay | Admitting: *Deleted

## 2017-12-25 DIAGNOSIS — G35 Multiple sclerosis: Secondary | ICD-10-CM | POA: Diagnosis not present

## 2017-12-25 NOTE — Telephone Encounter (Signed)
Tried calling pt back. Continued to ring, unable to LVM

## 2017-12-25 NOTE — Telephone Encounter (Signed)
Patient is having lots of spasms and her left leg is dragging.  Wants to be seen asap.  The first available is in February and feels this is not soon enough. Please call.

## 2017-12-26 ENCOUNTER — Ambulatory Visit: Payer: BLUE CROSS/BLUE SHIELD | Admitting: Neurology

## 2017-12-26 ENCOUNTER — Encounter: Payer: Self-pay | Admitting: Neurology

## 2017-12-26 VITALS — BP 135/82 | HR 75 | Ht 63.0 in | Wt 219.5 lb

## 2017-12-26 DIAGNOSIS — G373 Acute transverse myelitis in demyelinating disease of central nervous system: Secondary | ICD-10-CM | POA: Diagnosis not present

## 2017-12-26 DIAGNOSIS — G35 Multiple sclerosis: Secondary | ICD-10-CM | POA: Diagnosis not present

## 2017-12-26 DIAGNOSIS — Z79899 Other long term (current) drug therapy: Secondary | ICD-10-CM | POA: Diagnosis not present

## 2017-12-26 DIAGNOSIS — N319 Neuromuscular dysfunction of bladder, unspecified: Secondary | ICD-10-CM

## 2017-12-26 DIAGNOSIS — R29898 Other symptoms and signs involving the musculoskeletal system: Secondary | ICD-10-CM | POA: Insufficient documentation

## 2017-12-26 NOTE — Progress Notes (Signed)
[facility]  PATIENT: Tamara [name] DOB: August 22, 1960  REFERRING DOCTOR OR PCP:  Toribio Harbour SOURCE: Patient, notes from emergency room and recent hospital stay ([facility]), imaging and lab reports, MRI images on PACS.  _________________________________   HISTORICAL  CHIEF COMPLAINT:  Chief Complaint  Patient presents with  . Follow-up    RM 13, alone. Last seen 02/13/2017.   . Multiple Sclerosis    On Tysabri. Last infusion: 12/25/2017. Last JCV drawn 02/21/17. Negative, titer: 0.11. NEEDS JCV LAB TODAY  . nocturia and dysesthesias    Taking imipramine. Not taking oxybutynin. Doing kegal exercises.   Marland Kitchen Spasms    Pt c/o muscle spams in her thighs, bilaterally. This started this week on Monday. Leg leg is dragging. She also has involuntary body jerks, intermittent.   . Shocking sensation    Has shocking sensation in hands intermittently.   . Fall    Has been tripping over objects often, but no falls to report.     HISTORY OF PRESENT ILLNESS:  Tamara [name] Is a 57 y.o. woman who was diagnosed with multiple sclerosis in May 2018 after she presented with transverse myelitis.   Update 12/26/2017: She is on Tysabri and she tolerates it well.  Her last JCV Ab was negative 02/28/2017.     She was stable until this week.   She is having more weakness in her left leg over the past 2 days.     She notes the leg is low as she walks and would drag if she didn't lift up higher.    She gets an electric sensation in her hands like static electric discharges.     She gets some dysesthesias in her left leg but they are milder.    She is trying to do bladder exercises to help with urgency and she notes some benefit.     She notes a lot of fatigue.    She also has obesity and is on phentermine.   She notes improvement in focus/attnetion and fatigue with the phentermine but has not lost weight.     Update 02/13/2017: She is on Tysabri and feels her MS is stable.   She does note that joint  aches for 1-3 days after each infusion.   Sometimes she feels a little more tired for a week after the infusions.  She feels gait and balance are stable.  She still has a lot of dysesthesia.  She has some urinary urgency.   No incontinence.   She notes some blurry vision and black dots and night vision is worse.   Her MRI 05/2016 showed an enhancing focus at C3 and nonenhancing focus at C5 (and foci in brain nonenhancing)  She sees Dr. Louanne Skye for knees and shoulder pain.  He is trying to get her an exercise machine as she only gets 30 PT visits a year.     She is on Tylenol and tramadol for the pain.     She has fatigue helped by phentermine.   She has had trouble losing weight.  Phentermine has not helped weight loss though fatigue and wakefulness and apathy are much better.     From 10/11/2016:  MS:   She started Tysabri therapy July 2018 and she tolerates it well. The JCV antibody was negative (0.16) on 07/04/2016.     Gait/strength:  Her gait and balance improved after the second course of  IV Steroids and she feels she is still doing well.  The truncal dysestheias  and leg heaviness also improved,   She still notes electric sensations in both hands.   Her hands also seem mildly weak and clumsy.  Her hand dysesthesias are very uncomfortable and limit some of her activities.  She has trouble holding heavy pots.   Baclofen was stopped due to HA and swelling.    Numbness/dysesthesia:   Initially, she had a lot of numbness below the waist and into the groin. That has improved. She continues to have numbness in the hands and she gets an uncomfortable shocklike dysesthesias   Gabapentin and Lyrica were not tolerated in the past.    Tegretol caused itching and Trileptal caused mild swelling in her mouth?  Bladder/bowel:  Bladder function is doing well. She has some urinary frequency and urgency but is not having any incontinence recently.   Vision:  She denied any major change in vision or diplopia.        Colors are normal and symmetric.     Fatigue/sleep:  She notes some fatigue, physical > cognitive.    The combination of being on Tysabri and the higher dose of phentermine have helped.   She is trying to exercise more.   The dysesthesias in her hands wake her up and sleep is poor many nights.    She notes fatigue and sleepiness are much better if she takes a nap, though then night time sleep is worse.  Mood/Cognition:  Depression and anxiety are better now that she is on a DMT and more accepting of the diagnosis.   Cognition is fine.  MS History:   On 06/10/16, she had the onset of numbness and clumsiness in her legs.   As the day went on, her hands also became numb and clumsy.  She felt numb from her waist down.    She went to the [facility] and had an xtay of her back and was referred to orthopedics     Two days later, she went to the ED when her symptoms worsened and she was seen and discharged.    She saw her internist who ordered a lumbar MRI.  The lumbar MRI showed degenerative changes that were mild at L4-L5 and the study was otherwise fairly normal.   She was referred to the ED and had an MRI of the brain, cervical spine and thoracic spine.    She was found to have a transverse myelitis in the cervical spine with an enhancing lesion in the posterior columns. There was also a smaller non-enhancing focus at C5   She also had 5-6 spots in her brain, the largest being periventricular on the right.  She was admitted to [facility] and had 3 days of IV Solumedrol followed by an oral taper.    She was transferred to rehabilitation and was discharged 5/30.   She walked out and felt much better.   Additionally, while she was in the hospital (06/15/2016) she had a lumbar puncture. The CSF was abnormal showing showing the presence  4 oligoclonal bands and an elevated IgG index of 0.9 (less than 0.7 is normal). The myelin basic protein was mildly elevated at 2.2.      He had another exacerbation  after returning home with left leg dragging and feeling more off balance with stumbling.   In retrospect, in 2017, she had an episode lasting a few weeks where she felt she was dragging her left leg some. This completely resolved and she did not have any other symptoms  such as numbness at that time.   MRIs of the brain, cervical spine, thoracic spine and lumbar spine were personally reviewed. The MRI of the cervical spine shows an enhancing lesion posterior spinal cord adjacent to C2-C3. Additionally there is a nonenhancing focus adjacent to C5. In the brain, there are several T2/FLAIR hyperintense foci and the largest is in the right parietal lobe and there are 4 or 5 other small foci, one in the periventricular white matter of the right frontal lobe and the rest in the subcortical white matter.    Laboratory studies show 4 oligoclonal bands and an elevated IgG index of 0.9. Myelin basic protein was mildly elevated at 2.2. She had elevated glucose between 120 and 140 several times while getting steroids.   Her Vit D was mildly low (25.6) and she just started OTC supplementation.   She started Tysabri in July 2018   REVIEW OF SYSTEMS: Constitutional: No fevers, chills, sweats, or change in appetite Eyes: No visual changes, double vision, eye pain Ear, nose and throat: No hearing loss, ear pain, nasal congestion, sore throat Cardiovascular: No chest pain, palpitations Respiratory: No shortness of breath at rest or with exertion.   No wheezes GastrointestinaI: No nausea, vomiting, diarrhea, abdominal pain, fecal incontinence Genitourinary: No dysuria, urinary retention or frequency.  No nocturia. Musculoskeletal: No neck pain, back pain Integumentary: No rash, pruritus, skin lesions Neurological: as above Psychiatric: No depression at this time.  No anxiety Endocrine: No palpitations, diaphoresis, change in appetite, change in weigh or increased thirst Hematologic/Lymphatic: No anemia, purpura,  petechiae. Allergic/Immunologic: No itchy/runny eyes, nasal congestion, recent allergic reactions, rashes  ALLERGIES: Allergies  Allergen Reactions  . Codeine Other (See Comments)    Delusions  . Dilaudid [Hydromorphone Hcl] Swelling    Tongue swells   . Paxil [Paroxetine Hcl] Other (See Comments)    Hallucinations and heavy periods   . Tegretol [Carbamazepine] Itching  . Trileptal [Oxcarbazepine] Swelling  . Adhesive [Tape] Hives, Itching and Rash    PAPER TAPE=   . Augmentin [Amoxicillin-Pot Clavulanate] Rash  . Gabapentin Rash    Pt. sts. Gabapentin caused rash, hallucinations and depression    HOME MEDICATIONS:  Current Outpatient Medications:  .  cetirizine (ZYRTEC ALLERGY) 10 MG tablet, Take 1 tablet (10 mg total) by mouth daily. (Patient taking differently: Take 10 mg by mouth daily as needed for allergies. ), Disp: 30 tablet, Rfl: 2 .  diclofenac sodium (VOLTAREN) 1 % GEL, Apply 2 g topically 3 (three) times daily., Disp: 1 Tube, Rfl: 0 .  docusate sodium (COLACE) 100 MG capsule, Take 1 capsule (100 mg total) by mouth daily., Disp: 10 capsule, Rfl: 0 .  fluticasone (FLONASE) 50 MCG/ACT nasal spray, Place 2 sprays into both nostrils daily., Disp: 16 g, Rfl: 2 .  imipramine (TOFRANIL) 25 MG tablet, TAKE 2 TABLETS BY MOUTH AT BEDTIME, Disp: 60 tablet, Rfl: 4 .  natalizumab (TYSABRI) 300 MG/15ML injection, Inject 300 mg into the vein every 30 (thirty) days., Disp: , Rfl:  .  Omega-3 Fatty Acids (FISH OIL) 1000 MG CAPS, Take 1,000-2,000 mg by mouth See admin instructions. Take 2 capsules in the morning and take 1 capsule at bedtime , Disp: , Rfl:  .  pantoprazole (PROTONIX) 40 MG tablet, TAKE 1 TABLET BY MOUTH DAILY, Disp: 90 tablet, Rfl: 6 .  phentermine 37.5 MG capsule, TAKE 1 CAPSULE BY MOUTH ONCE DAILY IN THE MORNING, Disp: 30 capsule, Rfl: 5 .  traMADol (ULTRAM) 50 MG tablet, Take one  tablet twice daily as needed for severe pain. **MUST BE SEEN PRIOR TO FUTURE REFILLS.  PLEASE CALL THE OFFICE FOR AN APPT.**, Disp: 60 tablet, Rfl: 0 .  oxybutynin (DITROPAN) 5 MG tablet, Take one or two a day po (Patient not taking: Reported on 12/26/2017), Disp: 60 tablet, Rfl: 5  PAST MEDICAL HISTORY: Past Medical History:  Diagnosis Date  . Anemia   . Bowel obstruction ([place])   . GERD (gastroesophageal reflux disease)   . IBS (irritable bowel syndrome)    at age of 6  . Multiple sclerosis ([place])   . Sciatica 2009  . Vision abnormalities     PAST SURGICAL HISTORY: Past Surgical History:  Procedure Laterality Date  . ABDOMINAL HYSTERECTOMY  2008   cervix and right ovary still intact  . KNEE ARTHROSCOPY  2010 and 2011   Left knee, x2    FAMILY HISTORY: Family History  Problem Relation Age of Onset  . Cancer Father        Gallbladder  . Gallbladder disease Father   . Hypertension Brother   . Gallbladder disease Paternal Grandmother   . Colon cancer Neg Hx   . Colon polyps Neg Hx   . Esophageal cancer Neg Hx   . Kidney disease Neg Hx     SOCIAL HISTORY:  Social History   Socioeconomic History  . Marital status: Married    Spouse name: Not on file  . Number of children: 2  . Years of education: Not on file  . Highest education level: Not on file  Occupational History  . Occupation: Armed forces technical officer  Social Needs  . Financial resource strain: Not on file  . Food insecurity:    Worry: Not on file    Inability: Not on file  . Transportation needs:    Medical: Not on file    Non-medical: Not on file  Tobacco Use  . Smoking status: Never Smoker  . Smokeless tobacco: Never Used  Substance and Sexual Activity  . Alcohol use: No    Alcohol/week: 0.0 standard drinks  . Drug use: No  . Sexual activity: Not on file  Lifestyle  . Physical activity:    Days per week: Not on file    Minutes per session: Not on file  . Stress: Not on file  Relationships  . Social connections:    Talks on phone: Not on file    Gets together: Not on file     Attends religious service: Not on file    Active member of club or organization: Not on file    Attends meetings of clubs or organizations: Not on file    Relationship status: Not on file  . Intimate partner violence:    Fear of current or ex partner: Not on file    Emotionally abused: Not on file    Physically abused: Not on file    Forced sexual activity: Not on file  Other Topics Concern  . Not on file  Social History Narrative  . Not on file     PHYSICAL EXAM  Vitals:   12/26/17 1005  BP: 135/82  Pulse: 75  Weight: 219 lb 8 oz (99.6 kg)  Height: 5\' 3"  (1.6 m)    Body mass index is 38.88 kg/m.   General: The patient is well-developed and well-nourished and in no acute distress   Neurologic Exam  Mental status: The patient is alert and oriented x 3 at the time of the examination. The patient has apparent  normal recent and remote memory, with an apparently normal attention span and concentration ability.   Speech is normal.  Cranial nerves: Extraocular movements are full. Facial strength and sensation is normal. Trapezius strength is normal..  The tongue is midline, and the patient has symmetric elevation of the soft palate. No obvious hearing deficits are noted.  Motor:  Muscle bulk is normal.   Muscle tone is normal. Strength is 5/5 except 4+/5 hip flexion (iliopsoas).      Sensory: She reports normal and symmetric sensation to touch and vibration in the arms and legs.   Coordination: Cerebellar testing reveals good finger-nose-finger and normal heel-to-shin bilaterally.  Gait and station: Station is normal.   The gait is mildly wide and the tandem gait is moderately wide.  There is a slight left foot drop.  She does not have a Romberg sign.  Reflexes:  . Deep tendon reflexes are 3 and symmetric in the knees and ankles and 2 and symmetric in the arms.      DIAGNOSTIC DATA (LABS, IMAGING, TESTING) - I reviewed patient records, labs, notes, testing and imaging  myself where available.  Lab Results  Component Value Date   WBC 6.7 02/21/2017   HGB 11.6 02/21/2017   HCT 35.2 02/21/2017   MCV 84 02/21/2017   PLT 194 02/21/2017      Component Value Date/Time   NA 142 03/30/2017 1533   K 3.9 03/30/2017 1533   CL 104 03/30/2017 1533   CO2 23 03/30/2017 1533   GLUCOSE 83 03/30/2017 1533   GLUCOSE 91 07/09/2016 2107   BUN 13 03/30/2017 1533   CREATININE 0.94 03/30/2017 1533   CREATININE 1.01 07/08/2013 1440   CALCIUM 9.0 03/30/2017 1533   PROT 6.5 03/30/2017 1533   ALBUMIN 4.4 03/30/2017 1533   AST 15 03/30/2017 1533   ALT 15 03/30/2017 1533   ALKPHOS 72 03/30/2017 1533   BILITOT 0.2 03/30/2017 1533   GFRNONAA 68 03/30/2017 1533   GFRNONAA 64 07/08/2013 1440   GFRAA 78 03/30/2017 1533   GFRAA 74 07/08/2013 1440   No results found for: CHOL, HDL, LDLCALC, LDLDIRECT, TRIG, CHOLHDL No results found for: HGBA1C Lab Results  Component Value Date   VITAMINB12 2,209 (H) 06/15/2016   Lab Results  Component Value Date   TSH 0.351 06/15/2016       ASSESSMENT AND PLAN  Multiple sclerosis ([place]) - Plan: MR BRAIN W WO CONTRAST, MR CERVICAL SPINE W WO CONTRAST, Stratify JCV Antibody Test ([facility]), CBC with Differential/Platelet, Comprehensive metabolic panel  High risk medication use - Plan: Stratify JCV Antibody Test ([facility]), CBC with Differential/Platelet, Comprehensive metabolic panel  Transverse myelitis ([place])  Neurogenic bladder  Left leg weakness    1.    She will continue Tysabri 300 mg every 4 weeks.  We will check lab work including JCV antibody.   2.     MRI of the brain and cervical spine due to new neurologic symptoms to determine if there is any subclinical progression.  If this is occurring, consider a switch to different disease modifying therapy.  Since 3.    Stay active and exercise.     4.    RTC 6  months.   She is advised to call sooner if she has any new or worsening neurologic symptoms.    Richard A. Felecia Shelling,  MD, PhD, Charlynn Grimes  71/06/2692, 8:54 PM Certified in Neurology, Clinical Neurophysiology, Sleep Medicine, Pain Medicine and Neuroimaging Dir., [place] at [facility]  [facility] [address], [address] [place], [place] [address] [phone] 

## 2017-12-26 NOTE — Progress Notes (Signed)
Placed JCV lab in [facility] lock box for routine lab pick up.  

## 2017-12-26 NOTE — Telephone Encounter (Signed)
I called pt. She will come today at 930am for appt with Dr. Felecia Shelling. Scheduled pt.

## 2017-12-27 LAB — COMPREHENSIVE METABOLIC PANEL
A/G RATIO: 2 (ref 1.2–2.2)
ALBUMIN: 4.3 g/dL (ref 3.5–5.5)
ALT: 13 IU/L (ref 0–32)
AST: 13 IU/L (ref 0–40)
Alkaline Phosphatase: 77 IU/L (ref 39–117)
BILIRUBIN TOTAL: 0.2 mg/dL (ref 0.0–1.2)
BUN / CREAT RATIO: 12 (ref 9–23)
BUN: 13 mg/dL (ref 6–24)
CHLORIDE: 104 mmol/L (ref 96–106)
CO2: 26 mmol/L (ref 20–29)
Calcium: 9.3 mg/dL (ref 8.7–10.2)
Creatinine, Ser: 1.06 mg/dL — ABNORMAL HIGH (ref 0.57–1.00)
GFR calc non Af Amer: 58 mL/min/{1.73_m2} — ABNORMAL LOW (ref >59)
GFR, EST AFRICAN AMERICAN: 67 mL/min/{1.73_m2} (ref >59)
Globulin, Total: 2.1 g/dL (ref 1.5–4.5)
Glucose: 89 mg/dL (ref 65–99)
Potassium: 4.7 mmol/L (ref 3.5–5.2)
SODIUM: 143 mmol/L (ref 134–144)
TOTAL PROTEIN: 6.4 g/dL (ref 6.0–8.5)

## 2017-12-27 LAB — CBC WITH DIFFERENTIAL/PLATELET
BASOS ABS: 0 10*3/uL (ref 0.0–0.2)
Basos: 0 %
EOS (ABSOLUTE): 0.1 10*3/uL (ref 0.0–0.4)
Eos: 1 %
Hematocrit: 33.2 % — ABNORMAL LOW (ref 34.0–46.6)
Hemoglobin: 10.9 g/dL — ABNORMAL LOW (ref 11.1–15.9)
Immature Grans (Abs): 0 10*3/uL (ref 0.0–0.1)
Immature Granulocytes: 1 %
LYMPHS ABS: 2.4 10*3/uL (ref 0.7–3.1)
Lymphs: 37 %
MCH: 27.9 pg (ref 26.6–33.0)
MCHC: 32.8 g/dL (ref 31.5–35.7)
MCV: 85 fL (ref 79–97)
MONOCYTES: 9 %
MONOS ABS: 0.6 10*3/uL (ref 0.1–0.9)
NEUTROS ABS: 3.4 10*3/uL (ref 1.4–7.0)
NRBC: 1 % — AB (ref 0–0)
Neutrophils: 52 %
Platelets: 195 10*3/uL (ref 150–450)
RBC: 3.91 x10E6/uL (ref 3.77–5.28)
RDW: 14.7 % (ref 12.3–15.4)
WBC: 6.4 10*3/uL (ref 3.4–10.8)

## 2018-01-01 ENCOUNTER — Telehealth: Payer: Self-pay | Admitting: Neurology

## 2018-01-01 MED ORDER — ALPRAZOLAM 1 MG PO TABS: ORAL_TABLET | ORAL | 0 refills | Status: DC

## 2018-01-01 NOTE — Addendum Note (Signed)
Addended by: Arlice Colt A on: 01/01/2018 01:14 PM   Modules accepted: Orders

## 2018-01-01 NOTE — Telephone Encounter (Signed)
This is a Dr. Felecia Shelling patient.  MR Brain w/wo contrast & MR Cervical spine w/wo contrast Dr. Cheree Ditto Auth: [id number] (exp. 12/31/17 to 01/29/18). Patient is scheduled for 01/09/18 at [facility].  Patient also informed me she is claustrophobic and would like something to help her.

## 2018-01-01 NOTE — Addendum Note (Signed)
Addended by: Noberto Retort C on: 01/01/2018 10:25 AM   Modules accepted: Orders

## 2018-01-01 NOTE — Telephone Encounter (Addendum)
Per vo by Dr. Felecia Shelling, provide Xanax 1mg  with the following instructions: Take 1-2 tablets thirty minutes prior to MRI.  May repeat with 1 additional tablet prior to entering scanner, if needed.  Must have driver.   Patient is aware.

## 2018-01-03 ENCOUNTER — Other Ambulatory Visit: Payer: Self-pay

## 2018-01-03 ENCOUNTER — Ambulatory Visit (INDEPENDENT_AMBULATORY_CARE_PROVIDER_SITE_OTHER): Payer: BLUE CROSS/BLUE SHIELD | Admitting: Internal Medicine

## 2018-01-03 ENCOUNTER — Ambulatory Visit: Payer: BLUE CROSS/BLUE SHIELD

## 2018-01-03 ENCOUNTER — Telehealth: Payer: Self-pay | Admitting: *Deleted

## 2018-01-03 VITALS — BP 135/79 | HR 90 | Temp 98.2°F | Ht 63.0 in | Wt 223.1 lb

## 2018-01-03 DIAGNOSIS — Z1239 Encounter for other screening for malignant neoplasm of breast: Secondary | ICD-10-CM

## 2018-01-03 DIAGNOSIS — M545 Low back pain: Secondary | ICD-10-CM | POA: Diagnosis not present

## 2018-01-03 DIAGNOSIS — R51 Headache: Secondary | ICD-10-CM

## 2018-01-03 DIAGNOSIS — G8929 Other chronic pain: Secondary | ICD-10-CM

## 2018-01-03 DIAGNOSIS — M25561 Pain in right knee: Secondary | ICD-10-CM

## 2018-01-03 DIAGNOSIS — G35 Multiple sclerosis: Secondary | ICD-10-CM | POA: Diagnosis not present

## 2018-01-03 DIAGNOSIS — Z6839 Body mass index (BMI) 39.0-39.9, adult: Secondary | ICD-10-CM

## 2018-01-03 DIAGNOSIS — M25562 Pain in left knee: Secondary | ICD-10-CM

## 2018-01-03 DIAGNOSIS — Z79899 Other long term (current) drug therapy: Secondary | ICD-10-CM

## 2018-01-03 DIAGNOSIS — R202 Paresthesia of skin: Secondary | ICD-10-CM

## 2018-01-03 NOTE — Telephone Encounter (Signed)
JCV negative, titer: 0.15. Drawn on 12/26/2017

## 2018-01-07 DIAGNOSIS — M25562 Pain in left knee: Secondary | ICD-10-CM

## 2018-01-07 DIAGNOSIS — M25561 Pain in right knee: Secondary | ICD-10-CM | POA: Insufficient documentation

## 2018-01-07 NOTE — Assessment & Plan Note (Signed)
HPI recently saw Dr. Felecia Shelling a little over a week ago.  She is currently on Tysabri.  She notes that overall she is doing okay on this medication she does seem to have some issues just before the next infusion is due.  However she is follow-up with Dr. Rowe Pavy for these issues.  He also has planned a repeat MRI of her brain and C-spine to look for any subclinical demyelination's that may prompt change of therapy.  Assessment multiple sclerosis  Plan Follow-up with Dr. Felecia Shelling, currently treated with Fritzi Mandes

## 2018-01-07 NOTE — Progress Notes (Signed)
Subjective:  HPI: Tamara [name] is a 57 y.o. female who presents for f/u of MS and other chronic medical issues.  She recently switched PCP within our practice to myself.  Please see Assessment and Plan below for the status of her chronic medical problems.  Review of Systems: Review of Systems  Constitutional: Negative for fever.  HENT: Negative for hearing loss.   Eyes: Negative for blurred vision.  Cardiovascular: Negative for chest pain.  Gastrointestinal: Negative for constipation, diarrhea and nausea.  Genitourinary: Negative for dysuria and frequency.  Musculoskeletal: Positive for back pain and joint pain. Negative for falls, myalgias and neck pain.  Neurological: Positive for tingling and headaches. Negative for dizziness.  Endo/Heme/Allergies: Negative for polydipsia.    Objective:  Physical Exam: Vitals:   01/03/18 1043  BP: 135/79  Pulse: 90  Temp: 98.2 F (36.8 C)  TempSrc: Oral  SpO2: 100%  Weight: 223 lb 1.6 oz (101.2 kg)  Height: 5\' 3"  (1.6 m)   Body mass index is 39.52 kg/m. Physical Exam  Constitutional: She appears well-developed and well-nourished.  HENT:  Head: Normocephalic and atraumatic.  Cardiovascular: Normal rate and regular rhythm.  Pulmonary/Chest: Effort normal and breath sounds normal.  Abdominal: Soft. Bowel sounds are normal.  Musculoskeletal: She exhibits no edema.       Right knee: She exhibits no effusion. Tenderness found. Medial joint line, lateral joint line and patellar tendon tenderness noted.       Left knee: She exhibits no effusion. Tenderness found. Medial joint line, lateral joint line and patellar tendon tenderness noted.  Nursing note and vitals reviewed.  Assessment & Plan:  Multiple sclerosis (HCC) HPI recently saw Dr. Felecia Shelling a little over a week ago.  She is currently on Tysabri.  She notes that overall she is doing okay on this medication she does seem to have some issues just before the next infusion is due.  However  she is follow-up with Dr. Rowe Pavy for these issues.  He also has planned a repeat MRI of her brain and C-spine to look for any subclinical demyelination's that may prompt change of therapy.  Assessment multiple sclerosis  Plan Follow-up with Dr. Felecia Shelling, currently treated with Tysabri  Morbid obesity ([place]) HPI: Has continued to struggle with weight gain she notes this is causing extra low back pain as well as some knee pain.  She is currently on phentermine which does help with attention but has not resulted in significant weight loss.  Reports that number years ago she was able to lose quite a significant amount of weight with a low-carb diet.  Assessment morbid obesity  Plan 1 over weight loss strategies overall I think calorie counting is the best strategy I think it is reasonable for her to try her low-carb diet again.  I discussed that healthy weight loss is about 1 pound a week call me to me know if she has any issues with too rapid weight loss.  Bilateral knee pain HPI: She follows with Dr. Louanne Skye for her chronic bilateral knee pain.  She was thought to have some component of patellofemoral syndrome.  She recently had an MRI which does not show any need for acute intervention for patellofemoral she does have some degenerative meniscus bilaterally.  I do not see any notes of any planned intervention however for this.  We believe the plan is physical therapy which I agree with however she is not been able to participate in physical therapy.  Dr. Merrilee Seashore has been working on  getting her a device that can help with a home regimen of physical therapy I think this would be a good idea especially with her immunocompromise state due to MS on immunosuppression.  A: Bilateral knee pain, likely OA + meniscal tears.  P: Physical Therapy.     Medications Ordered No orders of the defined types were placed in this encounter.  Other Orders Orders Placed This Encounter  Procedures  . MM Digital Screening     Standing Status:   Future    Standing Expiration Date:   03/07/2019    Order Specific Question:   Reason for Exam (SYMPTOM  OR DIAGNOSIS REQUIRED)    Answer:   breast cancer screening    Order Specific Question:   Is the patient pregnant?    Answer:   No    Order Specific Question:   Preferred imaging location?    Answer:   [facility]   Follow Up: Return 3-4 months with Dr Heber [place].

## 2018-01-07 NOTE — Assessment & Plan Note (Signed)
HPI: Has continued to struggle with weight gain she notes this is causing extra low back pain as well as some knee pain.  She is currently on phentermine which does help with attention but has not resulted in significant weight loss.  Reports that number years ago she was able to lose quite a significant amount of weight with a low-carb diet.  Assessment morbid obesity  Plan 1 over weight loss strategies overall I think calorie counting is the best strategy I think it is reasonable for her to try her low-carb diet again.  I discussed that healthy weight loss is about 1 pound a week call me to me know if she has any issues with too rapid weight loss.

## 2018-01-07 NOTE — Assessment & Plan Note (Signed)
HPI: She follows with Dr. Louanne Skye for her chronic bilateral knee pain.  She was thought to have some component of patellofemoral syndrome.  She recently had an MRI which does not show any need for acute intervention for patellofemoral she does have some degenerative meniscus bilaterally.  I do not see any notes of any planned intervention however for this.  We believe the plan is physical therapy which I agree with however she is not been able to participate in physical therapy.  Dr. Merrilee Seashore has been working on getting her a device that can help with a home regimen of physical therapy I think this would be a good idea especially with her immunocompromise state due to MS on immunosuppression.  A: Bilateral knee pain, likely OA + meniscal tears.  P: Physical Therapy.

## 2018-01-09 ENCOUNTER — Other Ambulatory Visit: Payer: BLUE CROSS/BLUE SHIELD

## 2018-01-10 ENCOUNTER — Telehealth: Payer: Self-pay | Admitting: *Deleted

## 2018-01-10 NOTE — Telephone Encounter (Signed)
I spoke with Tamara [name] this am. She sts. she is having more severe spasms. Per RAS, ok for SoluMedrol 1gm once to see if this helps. Pt. agreeable. Orders given to [facility] in the infusion suite, with request that she call pt. to schedule appt. for today or tomorrow/fim

## 2018-01-10 NOTE — Telephone Encounter (Signed)
Beverly in the infusion suite advised me at 1pm this afternoon that she spoke with pt. this am and gave her an appt. for 1130, but pt. never showed up, never called to cancel or r/s./fim

## 2018-01-10 NOTE — Telephone Encounter (Signed)
Pt called stating she was unaware out infusion closed at 4- wanted to come in now if possible. Requesting a call to discuss what to do over night to keep her out of the hospital. Please advise.

## 2018-01-10 NOTE — Telephone Encounter (Signed)
I called the patients home number- no answer. I called the cell phone no VM set up.

## 2018-01-10 NOTE — Telephone Encounter (Signed)
-----   Message from Britt Bottom, MD sent at 01/10/2018  9:32 AM EST ----- She called Dr. Leta Baptist last night feeling spasms are doing worse and requesting another infusion   We can do a one day steroid infusion

## 2018-01-11 ENCOUNTER — Telehealth: Payer: Self-pay | Admitting: Neurology

## 2018-01-11 NOTE — Telephone Encounter (Signed)
Per Angie I was told to message you about Tamara [name] because she is having some concerns about her medication Imipramine 25mg  so she is needing a call today. Please call pt at [phone] cell.

## 2018-01-11 NOTE — Telephone Encounter (Signed)
She wanted to make sure that her Tofranil did not interact with her other medications.  Per vo by Dr. Felecia Shelling, she may experience increased dry mouth with the combination of Tofranil and Ditropan.  She does have dry mouth but does not wish to change medications.  She will try to sip on water and use sugar-free candy.

## 2018-01-14 NOTE — Telephone Encounter (Signed)
I called the patient. She was not happy with the way Tamara [name] spoke to her. I listened to her concerns and assured the patient that I would relay this to the TEFL teacher.

## 2018-01-21 ENCOUNTER — Telehealth: Payer: Self-pay | Admitting: *Deleted

## 2018-01-21 ENCOUNTER — Telehealth: Payer: Self-pay | Admitting: Neurology

## 2018-01-21 DIAGNOSIS — G35 Multiple sclerosis: Secondary | ICD-10-CM | POA: Diagnosis not present

## 2018-01-21 NOTE — Telephone Encounter (Signed)
I spoke to the patient she is scheduled for 02/06/18 at [facility].

## 2018-01-21 NOTE — Telephone Encounter (Signed)
Patient states she would like to schedule MRI.

## 2018-01-21 NOTE — Telephone Encounter (Signed)
Faxed completed/signed Tysabri pt status report and reauth questionnaire to [place] touch at [phone]. Received confirmation.  

## 2018-01-21 NOTE — Telephone Encounter (Signed)
Received fax notification from touch prescribing program that pt re-authorized 01/21/18-08/25/18. Pt enrollment number: [id number]. Account: [employer]. Site auth number: [id number].

## 2018-01-21 NOTE — Telephone Encounter (Signed)
pt has called for the intrafusion suite, call transferred °

## 2018-01-31 NOTE — Telephone Encounter (Signed)
Updated [employer] information. Auth: [id number] (exp. 01/31/18 to 03/01/18) patient is scheduled at [facility] for 02/06/18

## 2018-02-01 ENCOUNTER — Telehealth: Payer: Self-pay

## 2018-02-01 NOTE — Telephone Encounter (Signed)
Pt is requesting cranial prosthesis. Please call pt back.

## 2018-02-04 NOTE — Telephone Encounter (Signed)
Spoke to the patient she wanted to r/s her MRI until 02/20/18 bc she will have met her ded from her infusion that she is going to have on 02/18/18.

## 2018-02-05 NOTE — Telephone Encounter (Signed)
Spoke with Anasha, she is interested in getting a custom Cranial Prosthesis (number [id number]) for hair loss due to MS dx/medication.  I discussed that I will send in a Rx for this this to fax [phone] tomorrow.

## 2018-02-05 NOTE — Telephone Encounter (Signed)
Pt is calling back to speak with you about getting the cranial prosthesis.

## 2018-02-06 ENCOUNTER — Other Ambulatory Visit: Payer: BLUE CROSS/BLUE SHIELD

## 2018-02-06 NOTE — Telephone Encounter (Signed)
Rx faxed

## 2018-02-20 ENCOUNTER — Other Ambulatory Visit: Payer: BLUE CROSS/BLUE SHIELD

## 2018-02-21 DIAGNOSIS — G35 Multiple sclerosis: Secondary | ICD-10-CM | POA: Diagnosis not present

## 2018-03-11 ENCOUNTER — Other Ambulatory Visit: Payer: Self-pay | Admitting: Neurology

## 2018-03-25 ENCOUNTER — Other Ambulatory Visit: Payer: Self-pay | Admitting: *Deleted

## 2018-03-25 MED ORDER — TRAMADOL HCL 50 MG PO TABS: ORAL_TABLET | ORAL | 0 refills | Status: DC

## 2018-03-28 ENCOUNTER — Telehealth: Payer: Self-pay | Admitting: *Deleted

## 2018-03-28 DIAGNOSIS — G35 Multiple sclerosis: Secondary | ICD-10-CM | POA: Diagnosis not present

## 2018-03-28 NOTE — Telephone Encounter (Signed)
Pt in office today for infusion. She wanted refill on tramadol. Advised refill already sent to pharmacy on 03/25/18. She should check with pharmacy.  She also wanted to change her imipramine. States its causing her hair to thin. Advised Dr. Felecia Shelling out today. I will send him message for recommendation. She verbalized understanding.

## 2018-03-29 ENCOUNTER — Telehealth: Payer: Self-pay | Admitting: Neurology

## 2018-03-29 MED ORDER — NORTRIPTYLINE HCL 25 MG PO CAPS: 25.0000 mg | ORAL_CAPSULE | Freq: Every day | ORAL | 5 refills | Status: DC

## 2018-03-29 NOTE — Telephone Encounter (Signed)
I called the patient.  The patient has been switched from metformin to nortriptyline, she was having problems with hair loss on the imipramine.  The patient had problems previously with Paxil, but this medication is more similar to imipramine than Paxil, and she seemed to tolerate the imipramine well.

## 2018-03-29 NOTE — Telephone Encounter (Signed)
I contacted the patient and she is agreeable to stopping the imiparmine and starting the nortriptyline 25 mg 1 daily at bed time. Rx has been sent for nortriptyline 25 mg 1 daily # 30 with 5 refills to [facility] on [address]. Pt advised to report back if she is not tolerating this med well. Pt verbalized understanding.

## 2018-03-29 NOTE — Telephone Encounter (Signed)
Lets change the imipramine to nortriptyline 25 mg nightly  5 refills

## 2018-04-18 ENCOUNTER — Telehealth: Payer: Self-pay | Admitting: Internal Medicine

## 2018-04-18 NOTE — Telephone Encounter (Signed)
Spoke with Tyrah, she is doing well, trying to maintain social distancing.  Did note she had been started on nortriptline by dr sater, has caused some weight gain, and is tring to fight with insurance to get home exercise equipment covered.   Discussed would be fine to cancel next weeks appointment, call me with any new concerns.

## 2018-04-25 ENCOUNTER — Other Ambulatory Visit: Payer: Self-pay | Admitting: Neurology

## 2018-04-25 ENCOUNTER — Ambulatory Visit: Payer: BLUE CROSS/BLUE SHIELD | Admitting: Internal Medicine

## 2018-04-25 ENCOUNTER — Telehealth: Payer: Self-pay | Admitting: *Deleted

## 2018-04-25 MED ORDER — LAMOTRIGINE 100 MG PO TABS: 100.0000 mg | ORAL_TABLET | Freq: Two times a day (BID) | ORAL | 5 refills | Status: DC

## 2018-04-25 NOTE — Telephone Encounter (Signed)
Pt here for tysabri, having SE for weight gain, mood changes (agitation, not feeling happy), blurred vision, and loss of concentration, having trouble sleeping.  Was changed from tofranil (due to hair loss) to nortriptyline 2-28/20.

## 2018-05-02 ENCOUNTER — Telehealth: Payer: Self-pay | Admitting: Internal Medicine

## 2018-05-02 NOTE — Telephone Encounter (Signed)
   Reason for call:   I received a call from Ms. Jaylyne Carr at 9:00 PM indicating she has developed cough and shortness of breath since her last Tysabri infusion one week ago. Exposed to another patient with recent travel to [place] who was symptomatic.    Pertinent Data:   Patient has a past medical history of MS immunocompromised on tysabri (natalizumab) infusions every 4 weeks. Since her last transfusion on 3/26, she has developed a new cough and shortness of breath that has progressed. While at treatment, she was exposed to another patient who was symptomatic with cough and respiratory symptoms with recent travel to [place].   Patient states it is not unusual for her to develop transient symptoms of malaise or other non specific symptoms (sometimes shortness of breath) after her infusions, but symptoms usually resolve after 48-72 hours. It has now been 7 days since her treatment and she feels that her symptoms are progressing. She is only able to walk 20 feet now before becoming dyspneic and needing to rest. She does not have a way to check her blood pressure or pulse ox at home, but says she has been afebrile. Last temperature 98.5 F.   Discussed options with patient. Given her sick exposure and treatment with an immunosuppressant, she is certainly at higher risk of infection and decompensation. Offered to have patient come into ED for evaluation vs. Self isolate at home and follow up via tele health appointment in the morning. She is concerned about her risk of exposure coming into the ED and feels well enough to stay home at this time. She knows to call back or present to the ED if her symptoms worsen.      Assessment / Plan / Recommendations:   Advised patient self quarantine and monitor symptoms. Will have front desk contact her in the morning to schedule a tele health appointment for follow up.    As always, pt is advised that if symptoms worsen or new symptoms arise, she should go to the  ER for further evaluation.   Velna Ochs, MD   05/02/2018, 9:02 PM

## 2018-05-03 ENCOUNTER — Ambulatory Visit: Payer: BLUE CROSS/BLUE SHIELD | Admitting: Internal Medicine

## 2018-05-03 ENCOUNTER — Other Ambulatory Visit: Payer: Self-pay

## 2018-05-03 DIAGNOSIS — R0609 Other forms of dyspnea: Principal | ICD-10-CM

## 2018-05-03 DIAGNOSIS — R0600 Dyspnea, unspecified: Secondary | ICD-10-CM

## 2018-05-03 NOTE — Progress Notes (Signed)
[facility] Telephone triage for respiratory symptoms  This is a telephone encounter between [employer] and [employer] on 05/03/2018. The visit was conducted with the patient located at home and myself at [facility]. The patient's identity was confirmed using their DOB and current address. The patient has consented to being evaluated through a telephone encounter and understands the associated risks (an examination cannot be done and the patient may need to come in for an appointment) / benefits (allows the patient to remain at home, decreasing exposure to coronavirus).   Do you have a fever? No but has been using tylenol for myalgias  Do you have a cough? Yes - non-productive Do you have shortness of breath more than normal? Yes - severe OR while sitting Do you have chest pain?No Are you able to eat and drink normally? Yes Have you seen a physician for these symptoms? No  Associate symptoms? + myalgias, generalized weakness, fatigue, chest tightness, exertional chest, sore throat  I have reviewed the patients PMHx and medications.  Does the patient belong to an at risk population? (61 yrs age or older, chronic disease (cardiac, pulmonary, renal, severe obesity BMI >40, liver, diabetes, malignancy), immunocompromised, or pregnancy or within weeks after delivery / postpartum) Yes - MS on immunosuppressive therapy   Patient appears to have had a change in baseline functional status. Based on the patient's age and comorbidies they are at high risk for further decompensation.   Patient has been instructed to Come to [facility] for an assessment. High risk for decompensation. Asked for patient to come in for evaluation. She declined. Will ambulate this AM and if significant SHOB she will be a walk in this afternoon. Patient to phone if symptoms worsen or call 911 if condition becomes unstable.   The patient does need daily follow-up by an MD.   This visit was conducted using telehealth. I have personally spent 10  minutes discussing the patient's current clinical status, reviewing necessary PMHx/medications, and ensure adequate understanding of their current medical illness.   MEDICAID   [id number] + CR modifier 5-10 minutes  99442 + CR modifier 11-20 minutes  99443 + CR modifier 21-30 minutes   All other payors, 9921x + CR modifier +/- GE modifier

## 2018-05-04 ENCOUNTER — Telehealth: Payer: Self-pay | Admitting: Internal Medicine

## 2018-05-04 DIAGNOSIS — R0600 Dyspnea, unspecified: Secondary | ICD-10-CM

## 2018-05-04 DIAGNOSIS — R0609 Other forms of dyspnea: Principal | ICD-10-CM

## 2018-05-04 NOTE — Telephone Encounter (Signed)
[facility] COVID F/U  This is a telephone encounter between Tamara [name] and [name] on 05/04/2018 for follow-up due to symptoms concerning for COVID19. The visit was conducted with the patient located at home and [name] at [facility]. The patient's identity was confirmed using their DOB and current address. The patient has consented to being evaluated through a telephone encounter and understands the associated risks (an examination cannot be done and the patient may need to come in for an appointment) / benefits (allows the patient to remain at home, decreasing exposure to coronavirus  Are you overall feeling the same  Do you have a fever? No, but has been taking tylenol regularly.  Do you have a cough? Yes - non-productive Do you have shortness of breath more than normal? Yes - moderate Do you have chest pain?No Are you able to eat and drink normally? Yes Have you seen a physician for these symptoms? Via telephone only  I have reviewed the patients PMHx and medications.   Based on the patient interview, patient's clinical status appears to be Moderate or stable.  Based on the patient's age and comorbidities they are at high risk for further decompensation.  Patient has been instructed to seek further evaluation at either the emergency department or an urgent care. She declined and states that she if she gets worse she will come to the emergency department. Patient to phone if symptoms worsen or call 911 if condition becomes unstable.  The patient does need daily follow-up by an MD.   This visit was conducted using telehealth. I have personally spent 11 minutes discussing the patient's current clinical status, reviewing necessary PMHx/medications, and ensure adequate understanding of their current medical illness.   MEDICAID PHYSICIAN BILLING   [id number] + CR modifier 5-10 minutes  99442 + CR modifier 11-20 minutes  99443 + CR modifier 21-30 minutes   All other payors, 9921x + CR  modifier +/- GE modifier

## 2018-05-13 ENCOUNTER — Telehealth: Payer: Self-pay | Admitting: *Deleted

## 2018-05-13 NOTE — Telephone Encounter (Signed)
Attempted to call patient at number in chart. Tells me call cannot be completed as dialed. Did she leave a different number?  Thank you.

## 2018-05-13 NOTE — Telephone Encounter (Signed)
Attempting to call patient, have called x 4. No answer. Will continue to try.

## 2018-05-13 NOTE — Telephone Encounter (Signed)
Tamara [name] called she is under a lot of stress currently dealing with legal issues about getting evited, in addition to this she is concerned about the coronavirus.  She has been taking tylenol/ feeling fevers but no elevated temperature. She has lost sense of taste, she has 3 days of sweats/ chills, she has increased SOB increased but that happens sometimes after getting tysebri shot. She spoke to her neurologist who recommended 14 day home quarantine but is worried she will not be able to do so if she gets evicted and has to move to a shelter.  She is immunocompromised and at elevated risk from the novel coronavirus.  Recommend she stay home, will write letter with medical recommendation.  WIll have nursing follow up with patient, discussed precautions of worsening SOB needs to go to ER otherwise best strategy is to stay home.

## 2018-05-13 NOTE — Telephone Encounter (Signed)
Please call pt back @ [phone].

## 2018-05-13 NOTE — Telephone Encounter (Signed)
Pt would like dr Tarri Abernethy to call her she cont to refuse ED or urg care. Sending to dr's helberg and winfrey and to pcp

## 2018-05-13 NOTE — Telephone Encounter (Signed)
Their power has been out, you may reach her at the mobile# [phone]

## 2018-05-23 ENCOUNTER — Telehealth: Payer: Self-pay | Admitting: *Deleted

## 2018-05-23 MED ORDER — TRAMADOL HCL 50 MG PO TABS: ORAL_TABLET | ORAL | 0 refills | Status: DC

## 2018-05-23 NOTE — Telephone Encounter (Addendum)
The patient was not able to come to her Tysabri infusion today due having known exposure to several people with COVID-19 (while she was in [place]).  She has not been tested but has been quarantined since 05/13/2018, as recommended by her PCP.  She was exhibiting the following symptoms: shortness of breath, fever, sweats, cough, rash on and above eye, headaches (worse than usual), chills, palpitations, loss of taste/smell, syncopal events (x 2 on 05/07/2018 but none since then).  Today she is feeling better but not back to normal yet.  She still has mild shortness of breath, vertigo and unsteadiness.  She was initially concerned her vertigo was coming from Tramadol but she has been on this medication, as needed, since 03/25/2018.  She would now like a refill on Tramadol.  Also, patient reports not taking oxybutynin at this time.  She did not want her illness symptoms to be confused with potential medication side effects.  She has it at home to start at a later date.  Dr. Felecia Shelling is aware of the above information.  Dr. Felecia Shelling agreed to refill her Tramadol and this will be sent to her pharmacy ([place] narcotic registry checked). She is going call [facility] once she has improved and her recommended quarantine is over. She plans to reschedule within a couple of weeks.

## 2018-05-23 NOTE — Addendum Note (Signed)
Addended by: Britt Bottom on: 05/23/2018 06:28 PM   Modules accepted: Orders

## 2018-05-23 NOTE — Addendum Note (Signed)
Addended by: Desmond Lope on: 05/23/2018 03:22 PM   Modules accepted: Orders

## 2018-05-31 NOTE — Telephone Encounter (Signed)
[employer] called in and wanted to know if pt was on a therapy hold for Tysabri since 04/25/2018  CB # [phone]

## 2018-06-03 ENCOUNTER — Telehealth: Payer: Self-pay | Admitting: *Deleted

## 2018-06-03 ENCOUNTER — Encounter: Payer: Self-pay | Admitting: Neurology

## 2018-06-03 ENCOUNTER — Other Ambulatory Visit: Payer: Self-pay

## 2018-06-03 ENCOUNTER — Ambulatory Visit (INDEPENDENT_AMBULATORY_CARE_PROVIDER_SITE_OTHER): Payer: BLUE CROSS/BLUE SHIELD | Admitting: Neurology

## 2018-06-03 DIAGNOSIS — Z79899 Other long term (current) drug therapy: Secondary | ICD-10-CM | POA: Diagnosis not present

## 2018-06-03 DIAGNOSIS — R29898 Other symptoms and signs involving the musculoskeletal system: Secondary | ICD-10-CM

## 2018-06-03 DIAGNOSIS — R600 Localized edema: Secondary | ICD-10-CM | POA: Diagnosis not present

## 2018-06-03 DIAGNOSIS — G35 Multiple sclerosis: Secondary | ICD-10-CM | POA: Diagnosis not present

## 2018-06-03 DIAGNOSIS — R269 Unspecified abnormalities of gait and mobility: Secondary | ICD-10-CM

## 2018-06-03 MED ORDER — TOPIRAMATE 50 MG PO TABS: 50.0000 mg | ORAL_TABLET | Freq: Two times a day (BID) | ORAL | 5 refills | Status: DC

## 2018-06-03 MED ORDER — HYDROCHLOROTHIAZIDE 25 MG PO TABS: 25.0000 mg | ORAL_TABLET | Freq: Every day | ORAL | 5 refills | Status: DC

## 2018-06-03 NOTE — Telephone Encounter (Signed)
Per intrafusion, she is coming this afternoon for Tysabri infusion

## 2018-06-03 NOTE — Telephone Encounter (Signed)
Will check w/  int[facility]fusion to get update on pt.

## 2018-06-03 NOTE — Progress Notes (Addendum)
[facility]  PATIENT: Tamara [name] DOB: 01-17-1961  REFERRING DOCTOR OR PCP:  Nadean Corwin SOURCE: Patient, notes from emergency room and recent hospital stay ([facility]), imaging and lab reports, MRI images on PACS.  _________________________________   HISTORICAL  CHIEF COMPLAINT:  Chief Complaint  Patient presents with   Multiple Sclerosis    On Tysabri    HISTORY OF PRESENT ILLNESS:  Tamara [name] Is a 58 y.o. woman who was diagnosed with multiple sclerosis in May 2018 after she presented with transverse myelitis.   Update 06/03/2018: She feels her MS is stable and notes no new exacerbation.   She tolerates Tysabri well.   She was JCV b negative when last tested 12/27/18.  Gait, balance and strength are the same  She notes a static electric charge senation in her hands.    SHe has some toingling in her hands and also notes an electic shock sensation.     She notes swelling in her ankles since starting nortriptyline late last year.    She notes pitting with finger impressions.   She would note some left foot swelling since an injury years ago.   She notes a compression stocking helps.   She notes milder swelling in her hands.      She has gained about 20 pounds over the last year.    She denies shortness of breath and no wheezes.   We discussed weight loss.  She has not lost any weight on phentermine.  We discussed adding Topamax phentermine as that may help her lose weight.  Update 12/26/2017: She is on Tysabri and she tolerates it well.  Her last JCV Ab was negative 02/28/2017.     She was stable until this week.   She is having more weakness in her left leg over the past 2 days.     She notes the leg is low as she walks and would drag if she didn't lift up higher.    She gets an electric sensation in her hands like static electric discharges.     She gets some dysesthesias in her left leg but they are milder.    She is trying to do bladder exercises to help with  urgency and she notes some benefit.     She notes a lot of fatigue.    She also has obesity and is on phentermine.   She notes improvement in focus/attnetion and fatigue with the phentermine but has not lost weight.     Update 02/13/2017: She is on Tysabri and feels her MS is stable.   She does note that joint aches for 1-3 days after each infusion.   Sometimes she feels a little more tired for a week after the infusions.  She feels gait and balance are stable.  She still has a lot of dysesthesia.  She has some urinary urgency.   No incontinence.   She notes some blurry vision and black dots and night vision is worse.   Her MRI 05/2016 showed an enhancing focus at C3 and nonenhancing focus at C5 (and foci in brain nonenhancing)  She sees Dr. Otelia Sergeant for knees and shoulder pain.  He is trying to get her an exercise machine as she only gets 30 PT visits a year.     She is on Tylenol and tramadol for the pain.     She has fatigue helped by phentermine.   She has had trouble losing weight.  Phentermine has not helped weight loss  though fatigue and wakefulness and apathy are much better.     From 10/11/2016:  MS:   She started Tysabri therapy July 2018 and she tolerates it well. The JCV antibody was negative (0.16) on 07/04/2016.     Gait/strength:  Her gait and balance improved after the second course of  IV Steroids and she feels she is still doing well.  The truncal dysestheias and leg heaviness also improved,   She still notes electric sensations in both hands.   Her hands also seem mildly weak and clumsy.  Her hand dysesthesias are very uncomfortable and limit some of her activities.  She has trouble holding heavy pots.   Baclofen was stopped due to HA and swelling.    Numbness/dysesthesia:   Initially, she had a lot of numbness below the waist and into the groin. That has improved. She continues to have numbness in the hands and she gets an uncomfortable shocklike dysesthesias   Gabapentin and Lyrica  were not tolerated in the past.    Tegretol caused itching and Trileptal caused mild swelling in her mouth?  Bladder/bowel:  Bladder function is doing well. She has some urinary frequency and urgency but is not having any incontinence recently.   Vision:  She denied any major change in vision or diplopia.       Colors are normal and symmetric.     Fatigue/sleep:  She notes some fatigue, physical > cognitive.    The combination of being on Tysabri and the higher dose of phentermine have helped.   She is trying to exercise more.   The dysesthesias in her hands wake her up and sleep is poor many nights.    She notes fatigue and sleepiness are much better if she takes a nap, though then night time sleep is worse.  Mood/Cognition:  Depression and anxiety are better now that she is on a DMT and more accepting of the diagnosis.   Cognition is fine.  MS History:   On 06/10/16, she had the onset of numbness and clumsiness in her legs.   As the day went on, her hands also became numb and clumsy.  She felt numb from her waist down.    She went to the [facility] and had an xtay of her back and was referred to orthopedics     Two days later, she went to the ED when her symptoms worsened and she was seen and discharged.    She saw her internist who ordered a lumbar MRI.  The lumbar MRI showed degenerative changes that were mild at L4-L5 and the study was otherwise fairly normal.   She was referred to the ED and had an MRI of the brain, cervical spine and thoracic spine.    She was found to have a transverse myelitis in the cervical spine with an enhancing lesion in the posterior columns. There was also a smaller non-enhancing focus at C5   She also had 5-6 spots in her brain, the largest being periventricular on the right.  She was admitted to [facility] and had 3 days of IV Solumedrol followed by an oral taper.    She was transferred to rehabilitation and was discharged 5/30.   She walked out and felt  much better.   Additionally, while she was in the hospital (06/15/2016) she had a lumbar puncture. The CSF was abnormal showing showing the presence  4 oligoclonal bands and an elevated IgG index of 0.9 (less than 0.7  is normal). The myelin basic protein was mildly elevated at 2.2.      He had another exacerbation after returning home with left leg dragging and feeling more off balance with stumbling.   In retrospect, in 2017, she had an episode lasting a few weeks where she felt she was dragging her left leg some. This completely resolved and she did not have any other symptoms such as numbness at that time.   MRIs of the brain, cervical spine, thoracic spine and lumbar spine were personally reviewed. The MRI of the cervical spine shows an enhancing lesion posterior spinal cord adjacent to C2-C3. Additionally there is a nonenhancing focus adjacent to C5. In the brain, there are several T2/FLAIR hyperintense foci and the largest is in the right parietal lobe and there are 4 or 5 other small foci, one in the periventricular white matter of the right frontal lobe and the rest in the subcortical white matter.    Laboratory studies show 4 oligoclonal bands and an elevated IgG index of 0.9. Myelin basic protein was mildly elevated at 2.2. She had elevated glucose between 120 and 140 several times while getting steroids.   Her Vit D was mildly low (25.6) and she just started OTC supplementation.   She started Tysabri in July 2018   REVIEW OF SYSTEMS: Constitutional: No fevers, chills, sweats, or change in appetite Eyes: No visual changes, double vision, eye pain Ear, nose and throat: No hearing loss, ear pain, nasal congestion, sore throat Cardiovascular: No chest pain, palpitations Respiratory: No shortness of breath at rest or with exertion.   No wheezes GastrointestinaI: No nausea, vomiting, diarrhea, abdominal pain, fecal incontinence Genitourinary: No dysuria, urinary retention or frequency.  No  nocturia. Musculoskeletal: No neck pain, back pain Integumentary: No rash, pruritus, skin lesions Neurological: as above Psychiatric: No depression at this time.  No anxiety Endocrine: No palpitations, diaphoresis, change in appetite, change in weigh or increased thirst Hematologic/Lymphatic: No anemia, purpura, petechiae. Allergic/Immunologic: No itchy/runny eyes, nasal congestion, recent allergic reactions, rashes  ALLERGIES: Allergies  Allergen Reactions   Codeine Other (See Comments)    Delusions   Dilaudid [Hydromorphone Hcl] Swelling    Tongue swells    Paxil [Paroxetine Hcl] Other (See Comments)    Hallucinations and heavy periods    Tegretol [Carbamazepine] Itching   Trileptal [Oxcarbazepine] Swelling   Adhesive [Tape] Hives, Itching and Rash    PAPER TAPE=    Augmentin [Amoxicillin-Pot Clavulanate] Rash   Gabapentin Rash    Pt. sts. Gabapentin caused rash, hallucinations and depression    HOME MEDICATIONS:  Current Outpatient Medications:    ALPRAZolam (XANAX) 1 MG tablet, Take 1-2 tablets thirty minutes prior to MRI.  May take one additional tablet before entering scanner, if needed.  MUST HAVE DRIVER., Disp: 3 tablet, Rfl: 0   cetirizine (ZYRTEC ALLERGY) 10 MG tablet, Take 1 tablet (10 mg total) by mouth daily. (Patient taking differently: Take 10 mg by mouth daily as needed for allergies. ), Disp: 30 tablet, Rfl: 2   diclofenac sodium (VOLTAREN) 1 % GEL, Apply 2 g topically 3 (three) times daily., Disp: 1 Tube, Rfl: 0   docusate sodium (COLACE) 100 MG capsule, Take 1 capsule (100 mg total) by mouth daily., Disp: 10 capsule, Rfl: 0   fluticasone (FLONASE) 50 MCG/ACT nasal spray, Place 2 sprays into both nostrils daily., Disp: 16 g, Rfl: 2   hydrochlorothiazide (HYDRODIURIL) 25 MG tablet, Take 1 tablet (25 mg total) by mouth daily., Disp: 30 tablet,  Rfl: 5   lamoTRIgine (LAMICTAL) 100 MG tablet, Take 1 tablet (100 mg total) by mouth 2 (two) times  daily. Or as directed, Disp: 60 tablet, Rfl: 5   natalizumab (TYSABRI) 300 MG/15ML injection, Inject 300 mg into the vein every 30 (thirty) days., Disp: , Rfl:    Omega-3 Fatty Acids (FISH OIL) 1000 MG CAPS, Take 1,000-2,000 mg by mouth See admin instructions. Take 2 capsules in the morning and take 1 capsule at bedtime , Disp: , Rfl:    oxybutynin (DITROPAN) 5 MG tablet, Take one or two a day po (Patient not taking: Reported on 12/26/2017), Disp: 60 tablet, Rfl: 5   pantoprazole (PROTONIX) 40 MG tablet, TAKE 1 TABLET BY MOUTH DAILY, Disp: 90 tablet, Rfl: 6   phentermine 37.5 MG capsule, TAKE 1 CAPSULE BY MOUTH ONCE DAILY IN THE MORNING, Disp: 30 capsule, Rfl: 5   topiramate (TOPAMAX) 50 MG tablet, Take 1 tablet (50 mg total) by mouth 2 (two) times daily., Disp: 60 tablet, Rfl: 5   traMADol (ULTRAM) 50 MG tablet, Take one tablet twice daily as needed for severe pain., Disp: 60 tablet, Rfl: 0  PAST MEDICAL HISTORY: Past Medical History:  Diagnosis Date   Anemia    Bowel obstruction (HCC)    GERD (gastroesophageal reflux disease)    IBS (irritable bowel syndrome)    at age of 33   Multiple sclerosis (HCC)    Sciatica 2009   Vision abnormalities     PAST SURGICAL HISTORY: Past Surgical History:  Procedure Laterality Date   ABDOMINAL HYSTERECTOMY  2008   cervix and right ovary still intact   KNEE ARTHROSCOPY  2010 and 2011   Left knee, x2    FAMILY HISTORY: Family History  Problem Relation Age of Onset   Cancer Father        Gallbladder   Gallbladder disease Father    Hypertension Brother    Gallbladder disease Paternal Grandmother    Colon cancer Neg Hx    Colon polyps Neg Hx    Esophageal cancer Neg Hx    Kidney disease Neg Hx     SOCIAL HISTORY:  Social History   Socioeconomic History   Marital status: Married    Spouse name: Not on file   Number of children: 2   Years of education: Not on file   Highest education level: Not on file   Occupational History   Occupation: Science writer  Social Needs   Financial resource strain: Not on file   Food insecurity:    Worry: Not on file    Inability: Not on file   Transportation needs:    Medical: Not on file    Non-medical: Not on file  Tobacco Use   Smoking status: Never Smoker   Smokeless tobacco: Never Used  Substance and Sexual Activity   Alcohol use: No    Alcohol/week: 0.0 standard drinks   Drug use: No   Sexual activity: Not on file  Lifestyle   Physical activity:    Days per week: Not on file    Minutes per session: Not on file   Stress: Not on file  Relationships   Social connections:    Talks on phone: Not on file    Gets together: Not on file    Attends religious service: Not on file    Active member of club or organization: Not on file    Attends meetings of clubs or organizations: Not on file    Relationship status:  Not on file   Intimate partner violence:    Fear of current or ex partner: Not on file    Emotionally abused: Not on file    Physically abused: Not on file    Forced sexual activity: Not on file  Other Topics Concern   Not on file  Social History Narrative   Not on file     PHYSICAL EXAM  There were no vitals filed for this visit.  There is no height or weight on file to calculate BMI.   General: The patient is well-developed and well-nourished and in no acute distress   Neurologic Exam  Mental status: The patient is alert and oriented x 3 at the time of the examination. The patient has apparent normal recent and remote memory, with an apparently normal attention span and concentration ability.   Speech is normal.  Cranial nerves: Extraocular movements are full. Facial strength and sensation is normal. Trapezius strength is normal..  The tongue is midline, and the patient has symmetric elevation of the soft palate. No obvious hearing deficits are noted.  Motor:  Muscle bulk is normal.   Muscle tone  is normal. Strength is 5/5 except 4+/5 hip flexion (iliopsoas).      Sensory: She reports normal and symmetric sensation to touch and vibration in the arms and legs.   Coordination: Cerebellar testing reveals good finger-nose-finger and normal heel-to-shin bilaterally.  Gait and station: Station is normal.   The gait is mildly wide and the tandem gait is moderately wide.  There is a slight left foot drop.  She does not have a Romberg sign.  Reflexes:  . Deep tendon reflexes are 3 and symmetric in the knees and ankles and 2 and symmetric in the arms.      DIAGNOSTIC DATA (LABS, IMAGING, TESTING) - I reviewed patient records, labs, notes, testing and imaging myself where available.  Lab Results  Component Value Date   WBC 6.5 06/03/2018   HGB 10.6 (L) 06/03/2018   HCT 32.8 (L) 06/03/2018   MCV 87 06/03/2018   PLT 213 06/03/2018      Component Value Date/Time   NA 140 06/03/2018 1602   K 4.5 06/03/2018 1602   CL 103 06/03/2018 1602   CO2 26 06/03/2018 1602   GLUCOSE 87 06/03/2018 1602   GLUCOSE 91 07/09/2016 2107   BUN 14 06/03/2018 1602   CREATININE 1.07 (H) 06/03/2018 1602   CREATININE 1.01 07/08/2013 1440   CALCIUM 9.5 06/03/2018 1602   PROT 6.0 06/03/2018 1602   ALBUMIN 4.1 06/03/2018 1602   AST 15 06/03/2018 1602   ALT 13 06/03/2018 1602   ALKPHOS 71 06/03/2018 1602   BILITOT 0.2 06/03/2018 1602   GFRNONAA 58 (L) 06/03/2018 1602   GFRNONAA 64 07/08/2013 1440   GFRAA 67 06/03/2018 1602   GFRAA 74 07/08/2013 1440   No results found for: CHOL, HDL, LDLCALC, LDLDIRECT, TRIG, CHOLHDL No results found for: UJWJ1B Lab Results  Component Value Date   VITAMINB12 2,209 (H) 06/15/2016   Lab Results  Component Value Date   TSH 0.351 06/15/2016       ASSESSMENT AND PLAN  Multiple sclerosis (HCC) - Plan: Comprehensive metabolic panel, CBC with Differential/Platelet, Stratify JCV Antibody Test ([facility]), MR BRAIN W WO CONTRAST, MR CERVICAL SPINE W WO CONTRAST  Pedal  edema  Morbid obesity (HCC)  High risk medication use  Left leg weakness  Gait disturbance    1.    She will continue Tysabri 300 mg  every 4 weeks.  We will check lab work including JCV antibody.   2.     MRI of the brain and cervical spine due to new neurologic symptoms to determine if there is any subclinical progression.  If this is occurring, consider a switch to different disease modifying therapy.  Since 3.    Stay active and exercise.     4.    HCTZ for edema.  Wear compression stockings.  Check labs. 5.    Phentermine plus Topamax for weight loss.   RTC 6  months.   She is advised to call sooner if she has any new or worsening neurologic symptoms.   ADDENDUM 10/01/2018:  Mrs. Pethtel has an aggressive relapsing remitting MS presenting with 2 spinal cord exacerbations.   Due to the level of aggressiveness, it was medically necessary to be on a highly effective disease modifying therapy as her initial treatment.   --RAS     Hibo Blasdell A. Epimenio Foot, MD, PhD, [place]  06/04/2018, 5:18 PM Certified in Neurology, Clinical Neurophysiology, Sleep Medicine, Pain Medicine and Neuroimaging Dir., [facility]  [facility] [address], [address] [place], [place] [address] [phone]

## 2018-06-03 NOTE — Telephone Encounter (Signed)
I called [employer] at [phone]. Spoke with Armen Pickup. Advised pt therapy was on hold d/t pt being sick. She is feeling better and coming this afternoon for infusion. She will let case manager know. Nothing further needed.

## 2018-06-03 NOTE — Telephone Encounter (Signed)
Placed JCV lab in [facility] lock box for routine lab pick up. Results pending. 

## 2018-06-04 ENCOUNTER — Other Ambulatory Visit: Payer: Self-pay | Admitting: Neurology

## 2018-06-04 ENCOUNTER — Encounter: Payer: Self-pay | Admitting: Neurology

## 2018-06-04 DIAGNOSIS — R600 Localized edema: Secondary | ICD-10-CM | POA: Insufficient documentation

## 2018-06-04 DIAGNOSIS — R269 Unspecified abnormalities of gait and mobility: Secondary | ICD-10-CM | POA: Insufficient documentation

## 2018-06-04 DIAGNOSIS — G35 Multiple sclerosis: Secondary | ICD-10-CM

## 2018-06-04 LAB — CBC WITH DIFFERENTIAL/PLATELET
Basophils Absolute: 0 10*3/uL (ref 0.0–0.2)
Basos: 0 %
EOS (ABSOLUTE): 0.1 10*3/uL (ref 0.0–0.4)
Eos: 1 %
Hematocrit: 32.8 % — ABNORMAL LOW (ref 34.0–46.6)
Hemoglobin: 10.6 g/dL — ABNORMAL LOW (ref 11.1–15.9)
Immature Grans (Abs): 0 10*3/uL (ref 0.0–0.1)
Immature Granulocytes: 0 %
Lymphocytes Absolute: 3 10*3/uL (ref 0.7–3.1)
Lymphs: 48 %
MCH: 28 pg (ref 26.6–33.0)
MCHC: 32.3 g/dL (ref 31.5–35.7)
MCV: 87 fL (ref 79–97)
Monocytes Absolute: 0.7 10*3/uL (ref 0.1–0.9)
Monocytes: 10 %
NRBC: 1 % — ABNORMAL HIGH (ref 0–0)
Neutrophils Absolute: 2.7 10*3/uL (ref 1.4–7.0)
Neutrophils: 41 %
Platelets: 213 10*3/uL (ref 150–450)
RBC: 3.79 x10E6/uL (ref 3.77–5.28)
RDW: 14.8 % (ref 11.7–15.4)
WBC: 6.5 10*3/uL (ref 3.4–10.8)

## 2018-06-04 LAB — COMPREHENSIVE METABOLIC PANEL
ALT: 13 IU/L (ref 0–32)
AST: 15 IU/L (ref 0–40)
Albumin/Globulin Ratio: 2.2 (ref 1.2–2.2)
Albumin: 4.1 g/dL (ref 3.8–4.9)
Alkaline Phosphatase: 71 IU/L (ref 39–117)
BUN/Creatinine Ratio: 13 (ref 9–23)
BUN: 14 mg/dL (ref 6–24)
Bilirubin Total: 0.2 mg/dL (ref 0.0–1.2)
CO2: 26 mmol/L (ref 20–29)
Calcium: 9.5 mg/dL (ref 8.7–10.2)
Chloride: 103 mmol/L (ref 96–106)
Creatinine, Ser: 1.07 mg/dL — ABNORMAL HIGH (ref 0.57–1.00)
GFR calc Af Amer: 67 mL/min/{1.73_m2} (ref >59)
GFR calc non Af Amer: 58 mL/min/{1.73_m2} — ABNORMAL LOW (ref >59)
Globulin, Total: 1.9 g/dL (ref 1.5–4.5)
Glucose: 87 mg/dL (ref 65–99)
Potassium: 4.5 mmol/L (ref 3.5–5.2)
Sodium: 140 mmol/L (ref 134–144)
Total Protein: 6 g/dL (ref 6.0–8.5)

## 2018-06-04 NOTE — Progress Notes (Signed)
bmp 

## 2018-06-05 ENCOUNTER — Other Ambulatory Visit: Payer: Self-pay | Admitting: Neurology

## 2018-06-05 ENCOUNTER — Telehealth: Payer: Self-pay | Admitting: *Deleted

## 2018-06-05 ENCOUNTER — Telehealth: Payer: Self-pay | Admitting: Neurology

## 2018-06-05 NOTE — Telephone Encounter (Signed)
-----   Message from Britt Bottom, MD sent at 06/04/2018  4:47 PM EDT ----- Please let her know that the lab work is about the same as the last visit.  He has very mild reduced kidney function but the labs are otherwise fine.  Due to the swelling she should continue to wear compression stockings.   We need to check the sodium and potassium in the kidneys at her next Tysabri infusion to make sure that the water pill that I started her on does not worsen any disease.  I will put in the orders.

## 2018-06-05 NOTE — Telephone Encounter (Signed)
[employer] Auth: [id number] (exp. 06/05/18 to 12/01/18) order sent to GI. They will reach out to the pt to schedule.

## 2018-06-05 NOTE — Telephone Encounter (Signed)
Tried calling pt at [phone]. Went to automated message stating call could not be completed at this time and to try again later.  Tried calling [phone]. Went to VM. VM full, unable to LVM

## 2018-06-06 NOTE — Telephone Encounter (Signed)
Tried calling pt again on [phone], got automated message stating call could not be completed   Tried calling work number: [phone]. Got busy signal  Tried mobile [phone]. Went to VM, VM still full.  Pt is not signed up for mychart.

## 2018-06-10 NOTE — Telephone Encounter (Signed)
JCV ab drawn on 06/03/18 negative, index value: 0.16.

## 2018-07-03 ENCOUNTER — Telehealth: Payer: Self-pay | Admitting: Neurology

## 2018-07-03 ENCOUNTER — Other Ambulatory Visit: Payer: Self-pay

## 2018-07-03 ENCOUNTER — Other Ambulatory Visit (INDEPENDENT_AMBULATORY_CARE_PROVIDER_SITE_OTHER): Payer: Self-pay

## 2018-07-03 DIAGNOSIS — G35 Multiple sclerosis: Secondary | ICD-10-CM

## 2018-07-03 DIAGNOSIS — Z0289 Encounter for other administrative examinations: Secondary | ICD-10-CM

## 2018-07-03 NOTE — Telephone Encounter (Signed)
Patient stopped by the front desk upon leaving requesting a call back ASAP from [facility]. regarding [facility] and HIPPA concerns. Best [phone]

## 2018-07-04 ENCOUNTER — Encounter: Payer: Self-pay | Admitting: *Deleted

## 2018-07-04 ENCOUNTER — Telehealth: Payer: Self-pay | Admitting: *Deleted

## 2018-07-04 LAB — BASIC METABOLIC PANEL
BUN/Creatinine Ratio: 12 (ref 9–23)
BUN: 14 mg/dL (ref 6–24)
CO2: 20 mmol/L (ref 20–29)
Calcium: 9.3 mg/dL (ref 8.7–10.2)
Chloride: 102 mmol/L (ref 96–106)
Creatinine, Ser: 1.16 mg/dL — ABNORMAL HIGH (ref 0.57–1.00)
GFR calc Af Amer: 60 mL/min/{1.73_m2} (ref >59)
GFR calc non Af Amer: 52 mL/min/{1.73_m2} — ABNORMAL LOW (ref >59)
Glucose: 100 mg/dL — ABNORMAL HIGH (ref 65–99)
Potassium: 3.8 mmol/L (ref 3.5–5.2)
Sodium: 140 mmol/L (ref 134–144)

## 2018-07-04 NOTE — Telephone Encounter (Signed)
Tried [phone]. Received automated message, number not in service.   Tried [phone]. Mailbox full, unable to LVM.   Tried work number: [phone]. Got busy signal.   Will send letter since we have no valid phone number.

## 2018-07-04 NOTE — Telephone Encounter (Signed)
I called the patient's cell- mailbox was full unable to leave message.

## 2018-07-04 NOTE — Telephone Encounter (Signed)
Pt has called asking to speak with office Manager Megan re: something that took place in the infusion suite on yesterday. Pt is asking to be called sometime today

## 2018-07-04 NOTE — Telephone Encounter (Signed)
I called the patient's home number is states that the number is invalid.  I called her work number and it was busy.

## 2018-07-04 NOTE — Telephone Encounter (Signed)
-----   Message from Britt Bottom, MD sent at 07/04/2018  4:32 PM EDT ----- Please let the patient know that the lab work is about the same as the last visit.  She has mild kidney dysfunction.  The sodium level was unchanged.

## 2018-07-09 ENCOUNTER — Other Ambulatory Visit: Payer: Self-pay | Admitting: Neurology

## 2018-07-09 ENCOUNTER — Encounter: Payer: Self-pay | Admitting: *Deleted

## 2018-07-23 ENCOUNTER — Telehealth: Payer: Self-pay

## 2018-07-23 NOTE — Telephone Encounter (Signed)
Tysabri re-auth has been signed and faxed to [facility].  Fax # [phone], confirmation received.  Dr. Jannifer Franklin signed from for Dr. Felecia Shelling since he is out of the office.

## 2018-07-30 ENCOUNTER — Telehealth: Payer: Self-pay | Admitting: *Deleted

## 2018-07-30 NOTE — Telephone Encounter (Signed)
Pt called in and stated that the fax # [phone] and the time that it was sent 11:10am and 2:50pm , and they stated that the effective date 07/30/2018, member id # [id number] group # [id number]  Bin# [id number] pcn # [id number]  She states if Liane doesn't feel comfortable giving infusion its ok, she can be scheduled for another day but she cant go past this week due to the pain she is in

## 2018-07-30 NOTE — Telephone Encounter (Signed)
I called the patient.  I also tried to explain the process with intrafusion.  It seems that she has been told one thing by representatives in [place] and told something different by our office?  I advised that I would follow-up with [name] in the morning.  As of now the patient is amenable to taking the Thursday appointment at 1 PM.

## 2018-07-30 NOTE — Telephone Encounter (Addendum)
Took call from phone staff. Spoke with pt. She called from [phone].  Relayed per Graylon Gunning, RN they are still waiting on approval from [place], [place] that she can infuse and they have approval letter from [facility]. Pt expressed dissatisfaction and states she spoke with both [place] and was told she was good to be infused tomorrow. She was told they faxed our office directly the info. She was unable to provide fax number. She is going to call back after she speaks with [employer] to tell us what fax they sent it to.  Advised MM,NP did try to reach her 07/04/18 to discuss her concerns but was unable to reach her. I offered for her to speak with her while I had her on the phone and she declined.   After ending call with pt, I spoke with Liane again who states her manage, [name] is going to contact pt to further discuss. They cannot infuse until they have approval to do so.

## 2018-07-30 NOTE — Telephone Encounter (Signed)
Tried [phone]. Number not in service. Tried [phone]. Mailbox full. Tried [phone]. Got busy signal  Trying to reach pt. I spoke with infusion nurse and they were unable to reach her to reschedule infusion for tomorrow (07/31/18). Needs to be rescheduled because they are still waiting on approval from [place] for her copay assistance. Cannot infuse her until they receive this. They will call her to get her back on the schedule once they receive this.  Called Ricardo Jericho (daughter on [place]) at [phone]. She did a three way call. Went to VM. Daughter left mother a VM and relayed above message.

## 2018-07-30 NOTE — Telephone Encounter (Signed)
Called pt back. Advised I spoke with Tamara [name] Orthoptist) with intrafusion who states her approval is not yet finalized via [place]. They are still waiting on this. Offered to move her to Thursday at 830am or 100pm. Pt expressed dissasisfaction and states everything should be approved and states " If Tamara [name] didn't want to stick me she should have just said that". Advised we are moving her because she has not yet been approved and waiting on this via [place]. Pt continued to speak in a loud manner and stated "you are just being biased". I offered her to speak with manager, Tamara [name]. Placed her on hold. Tamara [name] was in meeting at time of call. She will call pt back after meeting. I relayed this to pt. Asked she keep her phone by her. Call back: [phone].

## 2018-08-01 ENCOUNTER — Telehealth: Payer: Self-pay | Admitting: *Deleted

## 2018-08-01 NOTE — Telephone Encounter (Signed)
Pt in office today for infusion. She asked to speak with me on her way out about letter I sent about lab results. Relayed it showed mild kidney dysfunction. She should f/u with PCP to have them continue to monitor this. Advised her value was 1.07. Reference range 0.57-1.00.   She states her infusion went well. She is scheduled for 08/29/18 for next infusion.

## 2018-08-01 NOTE — Telephone Encounter (Signed)
Pt has returned the call to NP [facility], she states all the voicemail stated from [name] was that she needed to be at her appointment today and nothing else.  Pt is asking that NP Jinny Blossom calls her today.  Pt was reminded that Jinny Blossom is a NP and an Glass blower/designer so when she is able she will return the call but there is no guarantee of it being a call back today.

## 2018-08-09 ENCOUNTER — Other Ambulatory Visit: Payer: Self-pay | Admitting: Neurology

## 2018-08-18 ENCOUNTER — Other Ambulatory Visit: Payer: Self-pay | Admitting: Neurology

## 2018-08-29 ENCOUNTER — Telehealth: Payer: Self-pay | Admitting: *Deleted

## 2018-08-29 MED ORDER — TOPIRAMATE 50 MG PO TABS: 50.0000 mg | ORAL_TABLET | Freq: Three times a day (TID) | ORAL | 5 refills | Status: DC

## 2018-08-29 NOTE — Telephone Encounter (Signed)
Pt here for Tysabri infusion today. She reports she is still having daily intermittent headaches. Have not improved since she last mentioned them around 05/2018. She takes tramadol and tylenol prn but wondering what else Dr. Felecia Shelling would recommend. I told pt Dr. Felecia Shelling in a room currently. Once he is out we will discuss and I will let her know. She verbalize understanding.  Spoke with Dr. Felecia Shelling. If she is taking topamax 50mg  BID po and tolerating well, we can increase her dose to 50mg  TID po. IF she is not taking/could not tolerate, we can add nortriptyline 25mg  po qhs.   I spoke with pt. She is taking topamax 50mg  BID and tolerating well. She was agreeable to increase dose to TID. I escribed rx. Advised her to give it at least 2-3 weeks on new dose to see full effects.   She also weighed herself while here: 202.5 lb

## 2018-10-03 ENCOUNTER — Telehealth: Payer: Self-pay | Admitting: Neurology

## 2018-10-03 NOTE — Telephone Encounter (Signed)
I checked with [facility]. Tamara Le, RN already on the phone with her letting her know information was submitted to their intake department for her new insurance. Her information was also given to their manager, [name] to help process.

## 2018-10-03 NOTE — Telephone Encounter (Signed)
Dr. Sater- FYI 

## 2018-10-03 NOTE — Telephone Encounter (Signed)
Patient called in requesting to speak to Dr. Garth Bigness nurse as she has been in contact with [employer] in regards to the prior-authorization for her infusions, and they told her they haven't received anything from our office. She said the number for [employer] is [phone]. She is upset this doesn't seem to be getting handled, and has missed the due date for her infusion. She would like a call bk by the end of the day of possible. Can be reached at [phone]

## 2018-10-05 ENCOUNTER — Other Ambulatory Visit: Payer: Self-pay | Admitting: Neurology

## 2018-10-08 NOTE — Telephone Encounter (Signed)
[place] Database Verified LR: 08-12-2018 Qty: 60 Pending appointment: 12-04-2018

## 2018-10-09 ENCOUNTER — Telehealth: Payer: Self-pay | Admitting: *Deleted

## 2018-10-09 NOTE — Telephone Encounter (Signed)
PA approved Effective from 10/09/2018 through 11/07/2018. Faxed notice of approval to [employer] at [phone]. Received fax confirmation.

## 2018-10-09 NOTE — Telephone Encounter (Signed)
Submitted urgent tramadol PA on CMM. Key: [id number] - Rx #: [id number]. Waiting on determination.

## 2018-10-17 ENCOUNTER — Other Ambulatory Visit: Payer: Self-pay | Admitting: Cardiology

## 2018-10-17 DIAGNOSIS — Z20822 Contact with and (suspected) exposure to covid-19: Secondary | ICD-10-CM

## 2018-10-17 NOTE — Addendum Note (Signed)
Addended by: Jonelle Sidle E on: 10/17/2018 02:54 PM   Modules accepted: Orders

## 2018-10-17 NOTE — Addendum Note (Signed)
Addended by: Terence Lux on: 10/17/2018 04:26 PM   Modules accepted: Orders

## 2018-10-19 LAB — NOVEL CORONAVIRUS, NAA: SARS-CoV-2, NAA: NOT DETECTED

## 2018-10-22 ENCOUNTER — Encounter: Payer: Self-pay | Admitting: Internal Medicine

## 2018-10-22 ENCOUNTER — Ambulatory Visit (INDEPENDENT_AMBULATORY_CARE_PROVIDER_SITE_OTHER): Payer: Self-pay | Admitting: Internal Medicine

## 2018-10-22 ENCOUNTER — Other Ambulatory Visit: Payer: Self-pay

## 2018-10-22 VITALS — BP 103/69 | HR 75 | Temp 98.6°F

## 2018-10-22 DIAGNOSIS — R112 Nausea with vomiting, unspecified: Secondary | ICD-10-CM

## 2018-10-22 DIAGNOSIS — R1031 Right lower quadrant pain: Secondary | ICD-10-CM

## 2018-10-22 DIAGNOSIS — R300 Dysuria: Secondary | ICD-10-CM

## 2018-10-22 DIAGNOSIS — R509 Fever, unspecified: Secondary | ICD-10-CM

## 2018-10-22 DIAGNOSIS — K5900 Constipation, unspecified: Secondary | ICD-10-CM

## 2018-10-22 LAB — POCT URINALYSIS DIPSTICK
Bilirubin, UA: NEGATIVE
Blood, UA: NEGATIVE
Glucose, UA: NEGATIVE
Nitrite, UA: NEGATIVE
Protein, UA: NEGATIVE
Spec Grav, UA: 1.02 (ref 1.010–1.025)
Urobilinogen, UA: 0.2 E.U./dL
pH, UA: 7.5 (ref 5.0–8.0)

## 2018-10-22 MED ORDER — ONDANSETRON HCL 4 MG PO TABS: 4.0000 mg | ORAL_TABLET | Freq: Three times a day (TID) | ORAL | 0 refills | Status: AC | PRN

## 2018-10-22 NOTE — Assessment & Plan Note (Signed)
Pt presents today with a 1 week history of flank and abdominal pain. Pt describes the pain as initially episodic and sharp and is now a constant dull ache. She points to the pain in the RLQ and states it is "deep in there, but doesn't go anywhere." Endorses persistent nausea resulting in limited PO intake. She had a vomiting episode last week, but none in the past few days. Pt states that she has constipation though this is normal for her due to her IBS medications. States she has been having fevers/chills at home but has not taken her temperature.  In the clinic today, she is hemodynamically normal without fever or tachycardia. Pt is dry on exam and abdominal exam is benign without tenderness, guarding, or rebound. Her orthostatics were negative. Urine dipstick significant for small leukocytes and trace proteins and ketones.  Assessment - RLQ pain Etiology of her RLQ pain unclear at this time thought differential includes neurogenic bowel, IBS, UTI, appendicitis, and less likely pancreatitis or cholecystitis. Will treat symptomatically, check lab work, and follow-up in 1 week. Pt was instructed to go to the emergency room should the pain worsen, she is unable to tolerate any PO intake, or she develops fevers/chills and vomiting.   Plan - ordered CMP, CBC, and lipase - encouraged increased PO intake at home - prescribed ondansetron 4mg  q8h before PO intake - will f/u in [facility] in 1 week

## 2018-10-22 NOTE — Progress Notes (Signed)
   CC: flank pain  HPI:  [name] is a 58 y.o. F with significant PMH as outline below who presents with a 1 week history of flank/abodminal pain. Please see problem-based charting for additional information.  Past Medical History:  Diagnosis Date  . Anemia   . Bowel obstruction ([place])   . GERD (gastroesophageal reflux disease)   . IBS (irritable bowel syndrome)    at age of 49  . Multiple sclerosis ([place])   . Sciatica 2009  . Vision abnormalities    Review of Systems:  Review of Systems  Constitutional: Positive for chills and fever.  Gastrointestinal: Positive for abdominal pain, constipation, nausea and vomiting. Negative for diarrhea.  Genitourinary: Positive for dysuria and flank pain. Negative for frequency and urgency.  Skin: Negative.   Neurological: Negative for dizziness and headaches.   Physical Exam:  Vitals:   10/22/18 1606  BP: 118/72  Pulse: 73  Temp: 98.6 F (37 C)  TempSrc: Oral  SpO2: 100%   Physical Exam Constitutional:      General: She is not in acute distress. HENT:     Mouth/Throat:     Mouth: Mucous membranes are dry.  Cardiovascular:     Rate and Rhythm: Normal rate and regular rhythm.     Heart sounds: Normal heart sounds.  Pulmonary:     Effort: Pulmonary effort is normal.     Breath sounds: Normal breath sounds.  Abdominal:     General: Abdomen is flat. Bowel sounds are normal.     Palpations: Abdomen is soft.     Tenderness: There is no abdominal tenderness. There is no right CVA tenderness, left CVA tenderness, guarding or rebound.  Neurological:     Mental Status: She is alert.    Assessment & Plan:   See Encounters Tab for problem based charting.  Patient seen with Dr. Philipp Ovens

## 2018-10-23 LAB — CBC WITH DIFFERENTIAL/PLATELET
Basophils Absolute: 0 10*3/uL (ref 0.0–0.2)
Basos: 0 %
EOS (ABSOLUTE): 0.1 10*3/uL (ref 0.0–0.4)
Eos: 1 %
Hematocrit: 33.7 % — ABNORMAL LOW (ref 34.0–46.6)
Hemoglobin: 11.1 g/dL (ref 11.1–15.9)
Immature Grans (Abs): 0 10*3/uL (ref 0.0–0.1)
Immature Granulocytes: 0 %
Lymphocytes Absolute: 3.8 10*3/uL — ABNORMAL HIGH (ref 0.7–3.1)
Lymphs: 42 %
MCH: 27.9 pg (ref 26.6–33.0)
MCHC: 32.9 g/dL (ref 31.5–35.7)
MCV: 85 fL (ref 79–97)
Monocytes Absolute: 0.6 10*3/uL (ref 0.1–0.9)
Monocytes: 7 %
NRBC: 1 % — ABNORMAL HIGH (ref 0–0)
Neutrophils Absolute: 4.5 10*3/uL (ref 1.4–7.0)
Neutrophils: 50 %
Platelets: 250 10*3/uL (ref 150–450)
RBC: 3.98 x10E6/uL (ref 3.77–5.28)
RDW: 15 % (ref 11.7–15.4)
WBC: 9 10*3/uL (ref 3.4–10.8)

## 2018-10-23 LAB — URINALYSIS, COMPLETE
Bilirubin, UA: NEGATIVE
Glucose, UA: NEGATIVE
Leukocytes,UA: NEGATIVE
Nitrite, UA: NEGATIVE
Protein,UA: NEGATIVE
RBC, UA: NEGATIVE
Specific Gravity, UA: 1.024 (ref 1.005–1.030)
Urobilinogen, Ur: 1 mg/dL (ref 0.2–1.0)
pH, UA: 8 — ABNORMAL HIGH (ref 5.0–7.5)

## 2018-10-23 LAB — MICROSCOPIC EXAMINATION
Casts: NONE SEEN /lpf
Epithelial Cells (non renal): >10 /hpf — AB (ref 0–10)

## 2018-10-23 LAB — LIPASE: Lipase: 43 U/L (ref 14–72)

## 2018-10-23 LAB — CMP14 + ANION GAP
ALT: 10 IU/L (ref 0–32)
AST: 12 IU/L (ref 0–40)
Albumin/Globulin Ratio: 2.2 (ref 1.2–2.2)
Albumin: 4.4 g/dL (ref 3.8–4.9)
Alkaline Phosphatase: 86 IU/L (ref 39–117)
Anion Gap: 16 mmol/L (ref 10.0–18.0)
BUN/Creatinine Ratio: 11 (ref 9–23)
BUN: 13 mg/dL (ref 6–24)
Bilirubin Total: <0.2 mg/dL (ref 0.0–1.2)
CO2: 22 mmol/L (ref 20–29)
Calcium: 9.8 mg/dL (ref 8.7–10.2)
Chloride: 106 mmol/L (ref 96–106)
Creatinine, Ser: 1.14 mg/dL — ABNORMAL HIGH (ref 0.57–1.00)
GFR calc Af Amer: 62 mL/min/{1.73_m2} (ref >59)
GFR calc non Af Amer: 54 mL/min/{1.73_m2} — ABNORMAL LOW (ref >59)
Globulin, Total: 2 g/dL (ref 1.5–4.5)
Glucose: 81 mg/dL (ref 65–99)
Potassium: 4 mmol/L (ref 3.5–5.2)
Sodium: 144 mmol/L (ref 134–144)
Total Protein: 6.4 g/dL (ref 6.0–8.5)

## 2018-10-23 NOTE — Progress Notes (Signed)
Internal Medicine Clinic Attending  I saw and evaluated the patient.  I personally confirmed the key portions of the history and exam documented by Dr. Jones and I reviewed pertinent patient test results.  The assessment, diagnosis, and plan were formulated together and I agree with the documentation in the resident's note.     

## 2018-11-12 ENCOUNTER — Other Ambulatory Visit: Payer: Self-pay | Admitting: *Deleted

## 2018-11-13 MED ORDER — PANTOPRAZOLE SODIUM 40 MG PO TBEC: 40.0000 mg | DELAYED_RELEASE_TABLET | Freq: Every day | ORAL | 6 refills | Status: DC

## 2018-12-04 ENCOUNTER — Other Ambulatory Visit: Payer: Self-pay

## 2018-12-04 ENCOUNTER — Encounter: Payer: Self-pay | Admitting: Neurology

## 2018-12-04 ENCOUNTER — Telehealth: Payer: Self-pay | Admitting: *Deleted

## 2018-12-04 ENCOUNTER — Ambulatory Visit (INDEPENDENT_AMBULATORY_CARE_PROVIDER_SITE_OTHER): Payer: BLUE CROSS/BLUE SHIELD | Admitting: Neurology

## 2018-12-04 VITALS — BP 117/78 | HR 72 | Temp 98.4°F | Ht 63.0 in | Wt 197.0 lb

## 2018-12-04 DIAGNOSIS — G373 Acute transverse myelitis in demyelinating disease of central nervous system: Secondary | ICD-10-CM | POA: Diagnosis not present

## 2018-12-04 DIAGNOSIS — N319 Neuromuscular dysfunction of bladder, unspecified: Secondary | ICD-10-CM

## 2018-12-04 DIAGNOSIS — Z79899 Other long term (current) drug therapy: Secondary | ICD-10-CM | POA: Diagnosis not present

## 2018-12-04 DIAGNOSIS — R269 Unspecified abnormalities of gait and mobility: Secondary | ICD-10-CM

## 2018-12-04 DIAGNOSIS — G35D Multiple sclerosis, unspecified: Secondary | ICD-10-CM

## 2018-12-04 DIAGNOSIS — G35 Multiple sclerosis: Secondary | ICD-10-CM

## 2018-12-04 DIAGNOSIS — R200 Anesthesia of skin: Secondary | ICD-10-CM

## 2018-12-04 NOTE — Progress Notes (Addendum)
[facility]  PATIENT: Tamara [name] DOB: [birth year]  REFERRING DOCTOR OR PCP:  Toribio Harbour SOURCE: Patient, notes from emergency room and recent hospital stay ([facility]), imaging and lab reports, MRI images on PACS.  _________________________________   HISTORICAL  CHIEF COMPLAINT:  Chief Complaint  Patient presents with  . Follow-up    RM 12, alone. Last seen 06/03/2018. Next infusion: 12/11/2018 at 2pm with intrafusion    HISTORY OF PRESENT ILLNESS:  Skarlette Wietecha Is a 58 y.o. woman who was diagnosed with relapsing remitting multiple sclerosis in May 2018 after she presented with transverse myelitis.   Update 12/04/2018: She is on Tysabri for her relapsing remitting MS and has tolerated it well.   Her last infusion was 3 weeks ago and next one is 12/11/18.   Her last MRI was 02/21/2017 and showed no new lesions.     She is having a lot of hand pain with left a little more than right.   She feels a Clinical biochemist.     She feels the MS is about the same    She is walking ok with legs dragging some    She has not had any falls.  She has some urinary frequency.   She feels dizzy if she look up    She has lost weight on the combination of phentermine with topiramate.     She felt more depressed the month she was off the Tysabri.       She takes tramadol when pain is worse.     She could not tolerate gabapentin, Lyrica, lamotrigine and oxcarbazepine.   She had ankle swelling with nortriptyline.   Her ankle swelling is better with HCTZ.     Update 06/03/2018: She feels her MS is stable and notes no new exacerbation.   She tolerates Tysabri well.   She was JCV b negative when last tested 12/27/18.  Gait, balance and strength are the same  She notes a static electric charge senation in her hands.    SHe has some toingling in her hands and also notes an electic shock sensation.     She notes swelling in her ankles since starting nortriptyline late last year.    She  notes pitting with finger impressions.   She would note some left foot swelling since an injury years ago.   She notes a compression stocking helps.   She notes milder swelling in her hands.      She has gained about 20 pounds over the last year.    She denies shortness of breath and no wheezes.   We discussed weight loss.  She has not lost any weight on phentermine.  We discussed adding Topamax phentermine as that may help her lose weight.  Update 12/26/2017: She is on Tysabri and she tolerates it well.  Her last JCV Ab was negative 02/28/2017.     She was stable until this week.   She is having more weakness in her left leg over the past 2 days.     She notes the leg is low as she walks and would drag if she didn't lift up higher.    She gets an electric sensation in her hands like static electric discharges.     She gets some dysesthesias in her left leg but they are milder.    She is trying to do bladder exercises to help with urgency and she notes some benefit.     She notes a lot  of fatigue.    She also has obesity and is on phentermine.   She notes improvement in focus/attnetion and fatigue with the phentermine but has not lost weight.     Update 02/13/2017: She is on Tysabri and feels her MS is stable.   She does note that joint aches for 1-3 days after each infusion.   Sometimes she feels a little more tired for a week after the infusions.  She feels gait and balance are stable.  She still has a lot of dysesthesia.  She has some urinary urgency.   No incontinence.   She notes some blurry vision and black dots and night vision is worse.   Her MRI 05/2016 showed an enhancing focus at C3 and nonenhancing focus at C5 (and foci in brain nonenhancing)  She sees Dr. Louanne Skye for knees and shoulder pain.  He is trying to get her an exercise machine as she only gets 30 PT visits a year.     She is on Tylenol and tramadol for the pain.     She has fatigue helped by phentermine.   She has had trouble losing  weight.  Phentermine has not helped weight loss though fatigue and wakefulness and apathy are much better.     From 10/11/2016:  MS:   She started Tysabri therapy July 2018 and she tolerates it well. The JCV antibody was negative (0.16) on 07/04/2016.     Gait/strength:  Her gait and balance improved after the second course of  IV Steroids and she feels she is still doing well.  The truncal dysestheias and leg heaviness also improved,   She still notes electric sensations in both hands.   Her hands also seem mildly weak and clumsy.  Her hand dysesthesias are very uncomfortable and limit some of her activities.  She has trouble holding heavy pots.   Baclofen was stopped due to HA and swelling.    Numbness/dysesthesia:   Initially, she had a lot of numbness below the waist and into the groin. That has improved. She continues to have numbness in the hands and she gets an uncomfortable shocklike dysesthesias   Gabapentin and Lyrica were not tolerated in the past.    Tegretol caused itching and Trileptal caused mild swelling in her mouth?  Bladder/bowel:  Bladder function is doing well. She has some urinary frequency and urgency but is not having any incontinence recently.   Vision:  She denied any major change in vision or diplopia.       Colors are normal and symmetric.     Fatigue/sleep:  She notes some fatigue, physical > cognitive.    The combination of being on Tysabri and the higher dose of phentermine have helped.   She is trying to exercise more.   The dysesthesias in her hands wake her up and sleep is poor many nights.    She notes fatigue and sleepiness are much better if she takes a nap, though then night time sleep is worse.  Mood/Cognition:  Depression and anxiety are better now that she is on a DMT and more accepting of the diagnosis.   Cognition is fine.  MS History:   On 06/10/16, she had the onset of numbness and clumsiness in her legs.   As the day went on, her hands also became numb  and clumsy.  She felt numb from her waist down.    She went to the [facility] and had an xtay of her back  and was referred to orthopedics     Two days later, she went to the ED when her symptoms worsened and she was seen and discharged.    She saw her internist who ordered a lumbar MRI.  The lumbar MRI showed degenerative changes that were mild at L4-L5 and the study was otherwise fairly normal.   She was referred to the ED and had an MRI of the brain, cervical spine and thoracic spine.    She was found to have a transverse myelitis in the cervical spine with an enhancing lesion in the posterior columns. There was also a smaller non-enhancing focus at C5   She also had 5-6 spots in her brain, the largest being periventricular on the right.  She was admitted to [facility] and had 3 days of IV Solumedrol followed by an oral taper.    She was transferred to rehabilitation and was discharged 5/30.   She walked out and felt much better.   Additionally, while she was in the hospital (06/15/2016) she had a lumbar puncture. The CSF was abnormal showing showing the presence  4 oligoclonal bands and an elevated IgG index of 0.9 (less than 0.7 is normal). The myelin basic protein was mildly elevated at 2.2.      He had another exacerbation after returning home with left leg dragging and feeling more off balance with stumbling.   In retrospect, in 2017, she had an episode lasting a few weeks where she felt she was dragging her left leg some. This completely resolved and she did not have any other symptoms such as numbness at that time.   MRIs of the brain, cervical spine, thoracic spine and lumbar spine were personally reviewed. The MRI of the cervical spine shows an enhancing lesion posterior spinal cord adjacent to C2-C3. Additionally there is a nonenhancing focus adjacent to C5. In the brain, there are several T2/FLAIR hyperintense foci and the largest is in the right parietal lobe and there are 4 or 5 other  small foci, one in the periventricular white matter of the right frontal lobe and the rest in the subcortical white matter.    Laboratory studies show 4 oligoclonal bands and an elevated IgG index of 0.9. Myelin basic protein was mildly elevated at 2.2. She had elevated glucose between 120 and 140 several times while getting steroids.   Her Vit D was mildly low (25.6) and she just started OTC supplementation.   She started Tysabri in July 2018   REVIEW OF SYSTEMS: Constitutional: No fevers, chills, sweats, or change in appetite Eyes: No visual changes, double vision, eye pain Ear, nose and throat: No hearing loss, ear pain, nasal congestion, sore throat Cardiovascular: No chest pain, palpitations Respiratory: No shortness of breath at rest or with exertion.   No wheezes GastrointestinaI: No nausea, vomiting, diarrhea, abdominal pain, fecal incontinence Genitourinary: No dysuria, urinary retention or frequency.  No nocturia. Musculoskeletal: No neck pain, back pain Integumentary: No rash, pruritus, skin lesions Neurological: as above Psychiatric: No depression at this time.  No anxiety Endocrine: No palpitations, diaphoresis, change in appetite, change in weigh or increased thirst Hematologic/Lymphatic: No anemia, purpura, petechiae. Allergic/Immunologic: No itchy/runny eyes, nasal congestion, recent allergic reactions, rashes  ALLERGIES: Allergies  Allergen Reactions  . Codeine Other (See Comments)    Delusions  . Dilaudid [Hydromorphone Hcl] Swelling    Tongue swells   . Paxil [Paroxetine Hcl] Other (See Comments)    Hallucinations and heavy periods   . Tegretol [Carbamazepine]  Itching  . Trileptal [Oxcarbazepine] Swelling  . Adhesive [Tape] Hives, Itching and Rash    PAPER TAPE=   . Augmentin [Amoxicillin-Pot Clavulanate] Rash  . Gabapentin Rash    Pt. sts. Gabapentin caused rash, hallucinations and depression    HOME MEDICATIONS:  Current Outpatient Medications:  .   ALPRAZolam (XANAX) 1 MG tablet, Take 1-2 tablets thirty minutes prior to MRI.  May take one additional tablet before entering scanner, if needed.  MUST HAVE DRIVER., Disp: 3 tablet, Rfl: 0 .  cetirizine (ZYRTEC ALLERGY) 10 MG tablet, Take 1 tablet (10 mg total) by mouth daily. (Patient taking differently: Take 10 mg by mouth daily as needed for allergies. ), Disp: 30 tablet, Rfl: 2 .  diclofenac sodium (VOLTAREN) 1 % GEL, Apply 2 g topically 3 (three) times daily., Disp: 1 Tube, Rfl: 0 .  docusate sodium (COLACE) 100 MG capsule, Take 1 capsule (100 mg total) by mouth daily., Disp: 10 capsule, Rfl: 0 .  fluticasone (FLONASE) 50 MCG/ACT nasal spray, Place 2 sprays into both nostrils daily., Disp: 16 g, Rfl: 2 .  hydrochlorothiazide (HYDRODIURIL) 25 MG tablet, Take 1 tablet (25 mg total) by mouth daily., Disp: 30 tablet, Rfl: 5 .  natalizumab (TYSABRI) 300 MG/15ML injection, Inject 300 mg into the vein every 30 (thirty) days., Disp: , Rfl:  .  Omega-3 Fatty Acids (FISH OIL) 1000 MG CAPS, Take 1,000-2,000 mg by mouth See admin instructions. Take 2 capsules in the morning and take 1 capsule at bedtime , Disp: , Rfl:  .  oxybutynin (DITROPAN) 5 MG tablet, Take one or two a day po, Disp: 60 tablet, Rfl: 5 .  pantoprazole (PROTONIX) 40 MG tablet, Take 1 tablet (40 mg total) by mouth daily., Disp: 90 tablet, Rfl: 6 .  phentermine 37.5 MG capsule, Take 1 capsule by mouth once daily in the morning, Disp: 30 capsule, Rfl: 5 .  topiramate (TOPAMAX) 50 MG tablet, Take 1 tablet (50 mg total) by mouth 3 (three) times daily., Disp: 90 tablet, Rfl: 5 .  traMADol (ULTRAM) 50 MG tablet, TAKE 1 TABLET BY MOUTH TWICE DAILY AS NEEDED FOR SEVERE PAIN, Disp: 60 tablet, Rfl: 0  PAST MEDICAL HISTORY: Past Medical History:  Diagnosis Date  . Anemia   . Bowel obstruction ([place])   . GERD (gastroesophageal reflux disease)   . IBS (irritable bowel syndrome)    at age of 26  . Multiple sclerosis ([place])   . Sciatica 2009  .  Vision abnormalities     PAST SURGICAL HISTORY: Past Surgical History:  Procedure Laterality Date  . ABDOMINAL HYSTERECTOMY  2008   cervix and right ovary still intact  . KNEE ARTHROSCOPY  2010 and 2011   Left knee, x2    FAMILY HISTORY: Family History  Problem Relation Age of Onset  . Cancer Father        Gallbladder  . Gallbladder disease Father   . Hypertension Brother   . Gallbladder disease Paternal Grandmother   . Colon cancer Neg Hx   . Colon polyps Neg Hx   . Esophageal cancer Neg Hx   . Kidney disease Neg Hx     SOCIAL HISTORY:  Social History   Socioeconomic History  . Marital status: Married    Spouse name: Not on file  . Number of children: 2  . Years of education: Not on file  . Highest education level: Not on file  Occupational History  . Occupation: Armed forces technical officer  Social Needs  . Financial  resource strain: Not on file  . Food insecurity    Worry: Not on file    Inability: Not on file  . Transportation needs    Medical: Not on file    Non-medical: Not on file  Tobacco Use  . Smoking status: Never Smoker  . Smokeless tobacco: Never Used  Substance and Sexual Activity  . Alcohol use: No    Alcohol/week: 0.0 standard drinks  . Drug use: No  . Sexual activity: Not on file  Lifestyle  . Physical activity    Days per week: Not on file    Minutes per session: Not on file  . Stress: Not on file  Relationships  . Social Herbalist on phone: Not on file    Gets together: Not on file    Attends religious service: Not on file    Active member of club or organization: Not on file    Attends meetings of clubs or organizations: Not on file    Relationship status: Not on file  . Intimate partner violence    Fear of current or ex partner: Not on file    Emotionally abused: Not on file    Physically abused: Not on file    Forced sexual activity: Not on file  Other Topics Concern  . Not on file  Social History Narrative  . Not on  file     PHYSICAL EXAM  Vitals:   12/04/18 1557  BP: 117/78  Pulse: 72  Temp: 98.4 F (36.9 C)  SpO2: 99%  Weight: 197 lb (89.4 kg)  Height: 5\' 3"  (1.6 m)    Body mass index is 34.9 kg/m.   General: The patient is well-developed and well-nourished and in no acute distress   Neurologic Exam  Mental status: The patient is alert and oriented x 3 at the time of the examination. The patient has apparent normal recent and remote memory, with an apparently normal attention span and concentration ability.   Speech is normal.  Cranial nerves: Extraocular movements are full. Facial strength and sensation is normal. Trapezius strength is normal..  The tongue is midline, and the patient has symmetric elevation of the soft palate. No obvious hearing deficits are noted.  Motor:  Muscle bulk is normal.   Muscle tone is normal. Strength is 5/5 except 4+/5 hip flexion (iliopsoas).      Sensory: She reports normal and symmetric sensation to touch and vibration in the arms and legs.   Coordination: Cerebellar testing reveals good finger-nose-finger and normal heel-to-shin bilaterally.  Gait and station: Station is normal.   The gait is mildly wide and the tandem gait is moderately wide.  There is a slight left foot drop.  She does not have a Romberg sign.  Reflexes:  . Deep tendon reflexes are 3 and symmetric in the knees and ankles and 2 and symmetric in the arms.      DIAGNOSTIC DATA (LABS, IMAGING, TESTING) - I reviewed patient records, labs, notes, testing and imaging myself where available.  Lab Results  Component Value Date   WBC 9.0 10/22/2018   HGB 11.1 10/22/2018   HCT 33.7 (L) 10/22/2018   MCV 85 10/22/2018   PLT 250 10/22/2018      Component Value Date/Time   NA 144 10/22/2018 1658   K 4.0 10/22/2018 1658   CL 106 10/22/2018 1658   CO2 22 10/22/2018 1658   GLUCOSE 81 10/22/2018 1658   GLUCOSE 91 07/09/2016 2107  BUN 13 10/22/2018 1658   CREATININE 1.14 (H)  10/22/2018 1658   CREATININE 1.01 07/08/2013 1440   CALCIUM 9.8 10/22/2018 1658   PROT 6.4 10/22/2018 1658   ALBUMIN 4.4 10/22/2018 1658   AST 12 10/22/2018 1658   ALT 10 10/22/2018 1658   ALKPHOS 86 10/22/2018 1658   BILITOT <0.2 10/22/2018 1658   GFRNONAA 54 (L) 10/22/2018 1658   GFRNONAA 64 07/08/2013 1440   GFRAA 62 10/22/2018 1658   GFRAA 74 07/08/2013 1440   No results found for: CHOL, HDL, LDLCALC, LDLDIRECT, TRIG, CHOLHDL No results found for: HGBA1C Lab Results  Component Value Date   VITAMINB12 2,209 (H) 06/15/2016   Lab Results  Component Value Date   TSH 0.351 06/15/2016       ASSESSMENT AND PLAN  Multiple sclerosis ([place]) - Plan: Stratify JCV Antibody Test ([facility]), CBC with Differential/Platelets, MR BRAIN W WO CONTRAST, MR CERVICAL SPINE W WO CONTRAST  Transverse myelitis ([place])  High risk medication use - Plan: Stratify JCV Antibody Test ([facility]), CBC with Differential/Platelets  Gait disturbance  Neurogenic bladder  Bilateral hand numbness - Plan: NCV with EMG(electromyography)   1.    She will continue Tysabri 300 mg every 4 weeks for her relapsing remitting MS..  We will check lab work including JCV antibody.   2.     MRI of the brain and cervical spine due to new neurologic symptoms to determine if there is any subclinical progression.  If this is occurring, consider a switch to different disease modifying therapy.   3.    Stay active and exercise.     4.    Continue phentermine plus Topamax for weight loss.   5.    RTC 6  months.   She is advised to call sooner if she has any new or worsening neurologic symptoms.   Richard A. Felecia Shelling, MD, PhD, Charlynn Grimes  [id number], [id number] PM Certified in Neurology, Clinical Neurophysiology, Sleep Medicine, Pain Medicine and Neuroimaging Dir., [place] [facility] [address], [place], [place] [address] [phone]

## 2018-12-04 NOTE — Telephone Encounter (Signed)
Placed JCV lab in [facility] lock box for routine lab pick up. Results pending. 

## 2018-12-05 ENCOUNTER — Telehealth: Payer: Self-pay | Admitting: Neurology

## 2018-12-05 LAB — CBC WITH DIFFERENTIAL/PLATELET
Basophils Absolute: 0 10*3/uL (ref 0.0–0.2)
Basos: 0 %
EOS (ABSOLUTE): 0 10*3/uL (ref 0.0–0.4)
Eos: 1 %
Hematocrit: 32.5 % — ABNORMAL LOW (ref 34.0–46.6)
Hemoglobin: 10.9 g/dL — ABNORMAL LOW (ref 11.1–15.9)
Immature Grans (Abs): 0 10*3/uL (ref 0.0–0.1)
Immature Granulocytes: 1 %
Lymphocytes Absolute: 3.4 10*3/uL — ABNORMAL HIGH (ref 0.7–3.1)
Lymphs: 47 %
MCH: 28.4 pg (ref 26.6–33.0)
MCHC: 33.5 g/dL (ref 31.5–35.7)
MCV: 85 fL (ref 79–97)
Monocytes Absolute: 0.6 10*3/uL (ref 0.1–0.9)
Monocytes: 8 %
Neutrophils Absolute: 3.2 10*3/uL (ref 1.4–7.0)
Neutrophils: 43 %
Platelets: 209 10*3/uL (ref 150–450)
RBC: 3.84 x10E6/uL (ref 3.77–5.28)
RDW: 14.5 % (ref 11.7–15.4)
WBC: 7.3 10*3/uL (ref 3.4–10.8)

## 2018-12-05 NOTE — Telephone Encounter (Signed)
[employer] Auth: [id number] (exp. 12/05/18 to 06/02/19) patient insurnace is in net work with [facility] order faxed to [facility] they will reach out to the patient to schedule. Their phone number is [phone] & fax # [phone]

## 2018-12-08 ENCOUNTER — Other Ambulatory Visit: Payer: Self-pay | Admitting: Neurology

## 2018-12-16 NOTE — Telephone Encounter (Signed)
JCV inhib assay is NEGATIVE

## 2018-12-16 NOTE — Telephone Encounter (Signed)
Received JCV result. Index value was 0.23 and JCV Antibody was Indeterminate.  Paper result given to Dr. Felecia Shelling to review.

## 2019-01-06 ENCOUNTER — Other Ambulatory Visit: Payer: Self-pay | Admitting: Neurology

## 2019-01-14 ENCOUNTER — Ambulatory Visit (INDEPENDENT_AMBULATORY_CARE_PROVIDER_SITE_OTHER): Payer: BLUE CROSS/BLUE SHIELD | Admitting: Neurology

## 2019-01-14 DIAGNOSIS — R200 Anesthesia of skin: Secondary | ICD-10-CM | POA: Diagnosis not present

## 2019-01-14 DIAGNOSIS — G35 Multiple sclerosis: Secondary | ICD-10-CM | POA: Diagnosis not present

## 2019-01-14 DIAGNOSIS — R269 Unspecified abnormalities of gait and mobility: Secondary | ICD-10-CM | POA: Diagnosis not present

## 2019-01-14 MED ORDER — LIDOCAINE 5 % EX OINT: 1.0000 "application " | TOPICAL_OINTMENT | CUTANEOUS | 2 refills | Status: DC | PRN

## 2019-01-14 NOTE — Progress Notes (Signed)
Full Name: Tamara [name] Gender: Female MRN #: [id number] Date of Birth: [birth year]    Visit Date: 01/14/2019 13:33 Age: 58 Years Examining Physician: Despina Arias, MD  Referring Physician: Despina Arias, MD    History: Tamara [name] is a 58 year old woman with multiple sclerosis who is reporting painful dysesthesias in both hands.  Strength was normal and symmetric   Nerve conduction studies: Bilateral median and ulnar motor responses had normal distal latencies, amplitudes and conduction velocities.  Ulnar F-wave latencies were normal and symmetric.  Bilateral median and ulnar sensory responses had normal peak latencies and amplitudes.  Electromyography: Needle EMG of selected muscles of both arms was performed.  Muscles tested had normal motor unit action potentials that recruited normally.  There was no abnormal spontaneous activity.  IMPRESSION: This is a normal NCV/EMG study.  There was no evidence of neuropathy, radiculopathy or other neuromuscular disorder.    Adolpho Meenach A. Epimenio Foot, MD, PhD, FAAN Certified in Neurology, Clinical Neurophysiology, Sleep Medicine, Pain Medicine and Neuroimaging Director, Multiple S[facility] at [facility]  [facility] [address] [place], [place] [address] [phone]  .     MNC    Nerve / Sites Muscle Latency Ref. Amplitude Ref. Rel Amp Segments Distance Velocity Ref. Area    ms ms mV mV %  cm m/s m/s mVms  L Median - APB     Wrist APB 3.6 ?4.4 9.0 ?4.0 100 Wrist - APB 7   34.5     Upper arm APB 7.4  8.6  94.9 Upper arm - Wrist 21 55 ?49 33.6  R Median - APB     Wrist APB 3.6 ?4.4 11.0 ?4.0 100 Wrist - APB 7   43.0     Upper arm APB 7.7  10.5  95.4 Upper arm - Wrist 21 52 ?49 42.6  L Ulnar - ADM     Wrist ADM 2.6 ?3.3 10.8 ?6.0 100 Wrist - ADM 7   38.3     B.Elbow ADM 6.1  7.7  70.9 B.Elbow - Wrist 20 58 ?49 28.9     A.Elbow ADM 7.8  7.1  93.4 A.Elbow - B.Elbow 10 58 ?49  25.3         A.Elbow - Wrist      R Ulnar - ADM     Wrist ADM 2.7 ?3.3 11.5 ?6.0 100 Wrist - ADM 7   39.5     B.Elbow ADM 5.9  10.1  88.2 B.Elbow - Wrist 20 63 ?49 38.2     A.Elbow ADM 7.7  9.2  90.7 A.Elbow - B.Elbow 10 55 ?49 35.2         A.Elbow - Wrist                 SNC    Nerve / Sites Rec. Site Peak Lat Ref.  Amp Ref. Segments Distance    ms ms V V  cm  L Median - Orthodromic (Dig II, Mid palm)     Dig II Wrist 3.1 ?3.4 15 ?10 Dig II - Wrist 13  R Median - Orthodromic (Dig II, Mid palm)     Dig II Wrist 2.9 ?3.4 12 ?10 Dig II - Wrist 13  L Ulnar - Orthodromic, (Dig V, Mid palm)     Dig V Wrist 2.7 ?3.1 19 ?5 Dig V - Wrist 11  R Ulnar - Orthodromic, (Dig V, Mid palm)     Dig  V Wrist 2.7 ?3.1 9 ?5 Dig V - Wrist 26             F  Wave    Nerve F Lat Ref.   ms ms  L Ulnar - ADM 27.6 ?32.0  R Ulnar - ADM 27.4 ?32.0         EMG Summary Table    Spontaneous MUAP Recruitment  Muscle IA Fib PSW Fasc Other Amp Dur. Poly Pattern  R. Deltoid Normal None None None _______ Normal Normal Normal Normal  R. Triceps brachii Normal None None None _______ Normal Normal Normal Normal  R. Biceps brachii Normal None None None _______ Normal Normal Normal Normal  R. Extensor digitorum communis Normal None None None _______ Normal Normal 1+ Normal  R. First dorsal interosseous Normal None None None _______ Normal Normal Normal Normal  L. Deltoid Normal None None None _______ Normal Normal Normal Normal  L. Triceps brachii Normal None None None _______ Normal Normal Normal Normal  L. Biceps brachii Normal None None None _______ Normal Normal Normal Normal  L. Extensor digitorum communis Normal None None None _______ Normal Normal Normal Normal  L. First dorsal interosseous Normal None None None _______ Normal Normal Normal Normal

## 2019-01-14 NOTE — Progress Notes (Signed)
[facility]  PATIENT: Tamara [name] DOB: 20-Dec-1960  REFERRING DOCTOR OR PCP:  Nadean Corwin SOURCE: Patient, notes from emergency room and recent hospital stay ([facility]), imaging and lab reports, MRI images on PACS.  _________________________________   HISTORICAL  CHIEF COMPLAINT:  Chief Complaint  Patient presents with  . Numbness  . Multiple Sclerosis    HISTORY OF PRESENT ILLNESS:  Tamara [name] Is a 58 y.o. woman who was diagnosed with multiple sclerosis in May 2018 after she presented with transverse myelitis.   Update 01/14/2019: She has reported more hand pain with an electric shock quality.  NCV/EMG study did not show any evidence of carpal tunnel syndrome, ulnar neuropathy or radiculopathy.  She feels her MS has been stable and she notes no recent change in her gait, strength or sensation.  She was unable to tolerate gabapentin, Lyrica, lamotrigine, nortriptyline or oxcarbazepine.  She was never on any of these medications long enough to know where the not there was much benefit.  Her MS has been stable on Tysabri.  She tolerates it well.  There were no new lesions on her 2019 MRI.  Update 12/04/2018: She is on Tysabri and has tolerated it well.   Her last infusion was 3 weeks ago and next one is 12/11/18.   Her last MRI was 02/21/2017 and showed no new lesions.    She is having a lot of hand pain with left a little more than right.   She feels a Scientist, water quality.     She feels the MS is about the same    She is walking ok with legs dragging some    She has not had any falls.  She has some urinary frequency.   She feels dizzy if she look up    She has lost weight on the combination of phentermine with topiramate.     She felt more depressed the month she was off the Tysabri.       She takes tramadol when pain is worse.     She could not tolerate gabapentin, Lyrica, lamotrigine and oxcarbazepine.   She had ankle swelling with nortriptyline.   Her  ankle swelling is better with HCTZ.     Update 06/03/2018: She feels her MS is stable and notes no new exacerbation.   She tolerates Tysabri well.   She was JCV b negative when last tested 12/27/18.  Gait, balance and strength are the same  She notes a static electric charge senation in her hands.    SHe has some toingling in her hands and also notes an electic shock sensation.     She notes swelling in her ankles since starting nortriptyline late last year.    She notes pitting with finger impressions.   She would note some left foot swelling since an injury years ago.   She notes a compression stocking helps.   She notes milder swelling in her hands.      She has gained about 20 pounds over the last year.    She denies shortness of breath and no wheezes.   We discussed weight loss.  She has not lost any weight on phentermine.  We discussed adding Topamax phentermine as that may help her lose weight.  Update 12/26/2017: She is on Tysabri and she tolerates it well.  Her last JCV Ab was negative 02/28/2017.     She was stable until this week.   She is having more weakness in her left leg  over the past 2 days.     She notes the leg is low as she walks and would drag if she didn't lift up higher.    She gets an electric sensation in her hands like static electric discharges.     She gets some dysesthesias in her left leg but they are milder.    She is trying to do bladder exercises to help with urgency and she notes some benefit.     She notes a lot of fatigue.    She also has obesity and is on phentermine.   She notes improvement in focus/attnetion and fatigue with the phentermine but has not lost weight.     Update 02/13/2017: She is on Tysabri and feels her MS is stable.   She does note that joint aches for 1-3 days after each infusion.   Sometimes she feels a little more tired for a week after the infusions.  She feels gait and balance are stable.  She still has a lot of dysesthesia.  She has some  urinary urgency.   No incontinence.   She notes some blurry vision and black dots and night vision is worse.   Her MRI 05/2016 showed an enhancing focus at C3 and nonenhancing focus at C5 (and foci in brain nonenhancing)  She sees Dr. Otelia Sergeant for knees and shoulder pain.  He is trying to get her an exercise machine as she only gets 30 PT visits a year.     She is on Tylenol and tramadol for the pain.     She has fatigue helped by phentermine.   She has had trouble losing weight.  Phentermine has not helped weight loss though fatigue and wakefulness and apathy are much better.     From 10/11/2016:  MS:   She started Tysabri therapy July 2018 and she tolerates it well. The JCV antibody was negative (0.16) on 07/04/2016.     Gait/strength:  Her gait and balance improved after the second course of  IV Steroids and she feels she is still doing well.  The truncal dysestheias and leg heaviness also improved,   She still notes electric sensations in both hands.   Her hands also seem mildly weak and clumsy.  Her hand dysesthesias are very uncomfortable and limit some of her activities.  She has trouble holding heavy pots.   Baclofen was stopped due to HA and swelling.    Numbness/dysesthesia:   Initially, she had a lot of numbness below the waist and into the groin. That has improved. She continues to have numbness in the hands and she gets an uncomfortable shocklike dysesthesias   Gabapentin and Lyrica were not tolerated in the past.    Tegretol caused itching and Trileptal caused mild swelling in her mouth?  Bladder/bowel:  Bladder function is doing well. She has some urinary frequency and urgency but is not having any incontinence recently.   Vision:  She denied any major change in vision or diplopia.       Colors are normal and symmetric.     Fatigue/sleep:  She notes some fatigue, physical > cognitive.    The combination of being on Tysabri and the higher dose of phentermine have helped.   She is trying  to exercise more.   The dysesthesias in her hands wake her up and sleep is poor many nights.    She notes fatigue and sleepiness are much better if she takes a nap, though then night time sleep is  worse.  Mood/Cognition:  Depression and anxiety are better now that she is on a DMT and more accepting of the diagnosis.   Cognition is fine.  MS History:   On 06/10/16, she had the onset of numbness and clumsiness in her legs.   As the day went on, her hands also became numb and clumsy.  She felt numb from her waist down.    She went to the [facility] and had an xtay of her back and was referred to orthopedics     Two days later, she went to the ED when her symptoms worsened and she was seen and discharged.    She saw her internist who ordered a lumbar MRI.  The lumbar MRI showed degenerative changes that were mild at L4-L5 and the study was otherwise fairly normal.   She was referred to the ED and had an MRI of the brain, cervical spine and thoracic spine.    She was found to have a transverse myelitis in the cervical spine with an enhancing lesion in the posterior columns. There was also a smaller non-enhancing focus at C5   She also had 5-6 spots in her brain, the largest being periventricular on the right.  She was admitted to [facility] and had 3 days of IV Solumedrol followed by an oral taper.    She was transferred to rehabilitation and was discharged 5/30.   She walked out and felt much better.   Additionally, while she was in the hospital (06/15/2016) she had a lumbar puncture. The CSF was abnormal showing showing the presence  4 oligoclonal bands and an elevated IgG index of 0.9 (less than 0.7 is normal). The myelin basic protein was mildly elevated at 2.2.      He had another exacerbation after returning home with left leg dragging and feeling more off balance with stumbling.   In retrospect, in 2017, she had an episode lasting a few weeks where she felt she was dragging her left leg some.  This completely resolved and she did not have any other symptoms such as numbness at that time.   MRIs of the brain, cervical spine, thoracic spine and lumbar spine were personally reviewed. The MRI of the cervical spine shows an enhancing lesion posterior spinal cord adjacent to C2-C3. Additionally there is a nonenhancing focus adjacent to C5. In the brain, there are several T2/FLAIR hyperintense foci and the largest is in the right parietal lobe and there are 4 or 5 other small foci, one in the periventricular white matter of the right frontal lobe and the rest in the subcortical white matter.    Laboratory studies show 4 oligoclonal bands and an elevated IgG index of 0.9. Myelin basic protein was mildly elevated at 2.2. She had elevated glucose between 120 and 140 several times while getting steroids.   Her Vit D was mildly low (25.6) and she just started OTC supplementation.   She started Tysabri in July 2018   REVIEW OF SYSTEMS: Constitutional: No fevers, chills, sweats, or change in appetite Eyes: No visual changes, double vision, eye pain Ear, nose and throat: No hearing loss, ear pain, nasal congestion, sore throat Cardiovascular: No chest pain, palpitations Respiratory: No shortness of breath at rest or with exertion.   No wheezes GastrointestinaI: No nausea, vomiting, diarrhea, abdominal pain, fecal incontinence Genitourinary: No dysuria, urinary retention or frequency.  No nocturia. Musculoskeletal: No neck pain, back pain Integumentary: No rash, pruritus, skin lesions Neurological: as above  Psychiatric: No depression at this time.  No anxiety Endocrine: No palpitations, diaphoresis, change in appetite, change in weigh or increased thirst Hematologic/Lymphatic: No anemia, purpura, petechiae. Allergic/Immunologic: No itchy/runny eyes, nasal congestion, recent allergic reactions, rashes  ALLERGIES: Allergies  Allergen Reactions  . Codeine Other (See Comments)    Delusions  .  Dilaudid [Hydromorphone Hcl] Swelling    Tongue swells   . Paxil [Paroxetine Hcl] Other (See Comments)    Hallucinations and heavy periods   . Tegretol [Carbamazepine] Itching  . Trileptal [Oxcarbazepine] Swelling  . Adhesive [Tape] Hives, Itching and Rash    PAPER TAPE=   . Augmentin [Amoxicillin-Pot Clavulanate] Rash  . Gabapentin Rash    Pt. sts. Gabapentin caused rash, hallucinations and depression    HOME MEDICATIONS:  Current Outpatient Medications:  .  ALPRAZolam (XANAX) 1 MG tablet, Take 1-2 tablets thirty minutes prior to MRI.  May take one additional tablet before entering scanner, if needed.  MUST HAVE DRIVER., Disp: 3 tablet, Rfl: 0 .  cetirizine (ZYRTEC ALLERGY) 10 MG tablet, Take 1 tablet (10 mg total) by mouth daily. (Patient taking differently: Take 10 mg by mouth daily as needed for allergies. ), Disp: 30 tablet, Rfl: 2 .  diclofenac sodium (VOLTAREN) 1 % GEL, Apply 2 g topically 3 (three) times daily., Disp: 1 Tube, Rfl: 0 .  docusate sodium (COLACE) 100 MG capsule, Take 1 capsule (100 mg total) by mouth daily., Disp: 10 capsule, Rfl: 0 .  fluticasone (FLONASE) 50 MCG/ACT nasal spray, Place 2 sprays into both nostrils daily., Disp: 16 g, Rfl: 2 .  hydrochlorothiazide (HYDRODIURIL) 25 MG tablet, Take 1 tablet by mouth once daily, Disp: 30 tablet, Rfl: 0 .  lidocaine (XYLOCAINE) 5 % ointment, Apply 1 application topically as needed. Apply to hands once or twice a day, Disp: 50 g, Rfl: 2 .  natalizumab (TYSABRI) 300 MG/15ML injection, Inject 300 mg into the vein every 30 (thirty) days., Disp: , Rfl:  .  Omega-3 Fatty Acids (FISH OIL) 1000 MG CAPS, Take 1,000-2,000 mg by mouth See admin instructions. Take 2 capsules in the morning and take 1 capsule at bedtime , Disp: , Rfl:  .  oxybutynin (DITROPAN) 5 MG tablet, Take one or two a day po, Disp: 60 tablet, Rfl: 5 .  pantoprazole (PROTONIX) 40 MG tablet, Take 1 tablet (40 mg total) by mouth daily., Disp: 90 tablet, Rfl: 6 .   phentermine 37.5 MG capsule, Take 1 capsule by mouth once daily in the morning, Disp: 30 capsule, Rfl: 5 .  topiramate (TOPAMAX) 50 MG tablet, Take 1 tablet (50 mg total) by mouth 3 (three) times daily., Disp: 90 tablet, Rfl: 5 .  traMADol (ULTRAM) 50 MG tablet, TAKE 1 TABLET BY MOUTH TWICE DAILY AS NEEDED FOR SEVERE PAIN, Disp: 60 tablet, Rfl: 0  PAST MEDICAL HISTORY: Past Medical History:  Diagnosis Date  . Anemia   . Bowel obstruction (HCC)   . GERD (gastroesophageal reflux disease)   . IBS (irritable bowel syndrome)    at age of 68  . Multiple sclerosis (HCC)   . Sciatica 2009  . Vision abnormalities     PAST SURGICAL HISTORY: Past Surgical History:  Procedure Laterality Date  . ABDOMINAL HYSTERECTOMY  2008   cervix and right ovary still intact  . KNEE ARTHROSCOPY  2010 and 2011   Left knee, x2    FAMILY HISTORY: Family History  Problem Relation Age of Onset  . Cancer Father  Gallbladder  . Gallbladder disease Father   . Hypertension Brother   . Gallbladder disease Paternal Grandmother   . Colon cancer Neg Hx   . Colon polyps Neg Hx   . Esophageal cancer Neg Hx   . Kidney disease Neg Hx     SOCIAL HISTORY:  Social History   Socioeconomic History  . Marital status: Married    Spouse name: Not on file  . Number of children: 2  . Years of education: Not on file  . Highest education level: Not on file  Occupational History  . Occupation: Science writer  Tobacco Use  . Smoking status: Never Smoker  . Smokeless tobacco: Never Used  Substance and Sexual Activity  . Alcohol use: No    Alcohol/week: 0.0 standard drinks  . Drug use: No  . Sexual activity: Not on file  Other Topics Concern  . Not on file  Social History Narrative  . Not on file   Social Determinants of Health   Financial Resource Strain:   . Difficulty of Paying Living Expenses: Not on file  Food Insecurity:   . Worried About Programme researcher, broadcasting/film/video in the Last Year: Not on file    . Ran Out of Food in the Last Year: Not on file  Transportation Needs:   . Lack of Transportation (Medical): Not on file  . Lack of Transportation (Non-Medical): Not on file  Physical Activity:   . Days of Exercise per Week: Not on file  . Minutes of Exercise per Session: Not on file  Stress:   . Feeling of Stress : Not on file  Social Connections:   . Frequency of Communication with Friends and Family: Not on file  . Frequency of Social Gatherings with Friends and Family: Not on file  . Attends Religious Services: Not on file  . Active Member of Clubs or Organizations: Not on file  . Attends Banker Meetings: Not on file  . Marital Status: Not on file  Intimate Partner Violence:   . Fear of Current or Ex-Partner: Not on file  . Emotionally Abused: Not on file  . Physically Abused: Not on file  . Sexually Abused: Not on file     PHYSICAL EXAM  There were no vitals filed for this visit.  There is no height or weight on file to calculate BMI.   General: The patient is well-developed and well-nourished and in no acute distress   Neurologic Exam  Mental status: The patient is alert and oriented x 3 at the time of the examination. The patient has apparent normal recent and remote memory, with an apparently normal attention span and concentration ability.   Speech is normal.  Cranial nerves: Extraocular movements are full. Facial strength and sensation is normal. Trapezius strength is normal..  The tongue is midline, and the patient has symmetric elevation of the soft palate. No obvious hearing deficits are noted.  Motor:  Muscle bulk is normal.   Muscle tone is normal. Strength is 5/5 except 4+/5 hip flexion (iliopsoas).      Sensory: She appeared to have normal and symmetric sensation in the hands.  She did have a Tinel's sign on the left at the wrist over the median nerve.  Coordination: Cerebellar testing reveals good finger-nose-finger and normal heel-to-shin  bilaterally.  Gait and station: Station is normal.   The gait is mildly wide.  Tandem gait is poor.  She has a mild left foot drop.  She does not  have a Romberg sign.  Reflexes:  . Deep tendon reflexes are 3 and symmetric in the knees and ankles and 2 and symmetric in the arms.      DIAGNOSTIC DATA (LABS, IMAGING, TESTING) - I reviewed patient records, labs, notes, testing and imaging myself where available.  Lab Results  Component Value Date   WBC 7.3 12/04/2018   HGB 10.9 (L) 12/04/2018   HCT 32.5 (L) 12/04/2018   MCV 85 12/04/2018   PLT 209 12/04/2018      Component Value Date/Time   NA 144 10/22/2018 1658   K 4.0 10/22/2018 1658   CL 106 10/22/2018 1658   CO2 22 10/22/2018 1658   GLUCOSE 81 10/22/2018 1658   GLUCOSE 91 07/09/2016 2107   BUN 13 10/22/2018 1658   CREATININE 1.14 (H) 10/22/2018 1658   CREATININE 1.01 07/08/2013 1440   CALCIUM 9.8 10/22/2018 1658   PROT 6.4 10/22/2018 1658   ALBUMIN 4.4 10/22/2018 1658   AST 12 10/22/2018 1658   ALT 10 10/22/2018 1658   ALKPHOS 86 10/22/2018 1658   BILITOT <0.2 10/22/2018 1658   GFRNONAA 54 (L) 10/22/2018 1658   GFRNONAA 64 07/08/2013 1440   GFRAA 62 10/22/2018 1658   GFRAA 74 07/08/2013 1440   No results found for: CHOL, HDL, LDLCALC, LDLDIRECT, TRIG, CHOLHDL No results found for: ZOXW9U Lab Results  Component Value Date   VITAMINB12 2,209 (H) 06/15/2016   Lab Results  Component Value Date   TSH 0.351 06/15/2016       ASSESSMENT AND PLAN  Multiple sclerosis (HCC)  Gait disturbance  Hand numbness   1.    I discussed with her that the numbness she is experiencing the hand is more likely to be related to MS as the NCV/EMG study did not show any evidence of carpal tunnel syndrome, ulnar neuropathy or radiculopathy. 2.     Continue Tysabri..   3.    Stay active and exercise.     4.    Lidocaine ointment twice a day for the pain in the hands 5.    RTC 6  months.   She is advised to call sooner if she  has any new or worsening neurologic symptoms.   Datron Brakebill A. Epimenio Foot, MD, PhD, [place]  01/14/2019, 7:36 PM Certified in Neurology, Clinical Neurophysiology, Sleep Medicine, Pain Medicine and Neuroimaging Dir., [facility] at [facility]  [facility] [address] [place], [place] [address] [phone]

## 2019-01-15 ENCOUNTER — Telehealth: Payer: Self-pay | Admitting: *Deleted

## 2019-01-15 NOTE — Telephone Encounter (Signed)
Submitted PA, waiting on determination. Dx: M79.2, R20.8

## 2019-01-15 NOTE — Telephone Encounter (Signed)
Initiated PA lidocaine ointment on CMM. [employer] - Rx #[id number]. In process of completing.

## 2019-01-16 IMAGING — MR MR LUMBAR SPINE W/O CM
4 of 5 series · 18 of 48 positions shown · non-contrast
Comparison: Prior radiograph from 06/10/2016.

CLINICAL DATA: Initial evaluation for acute pain with hyper
reflexia. Numbness and tingling and lumbar region.

EXAM:
MRI LUMBAR SPINE WITHOUT CONTRAST
TECHNIQUE: Multiplanar, multisequence MR imaging of the lumbar spine was
performed. No intravenous contrast was administered.

[Series 3: T1 · sagittal · 4.0mm · 0.51mm/px · 3 of 13 slices shown (1 of 2)]
[im 3/13]
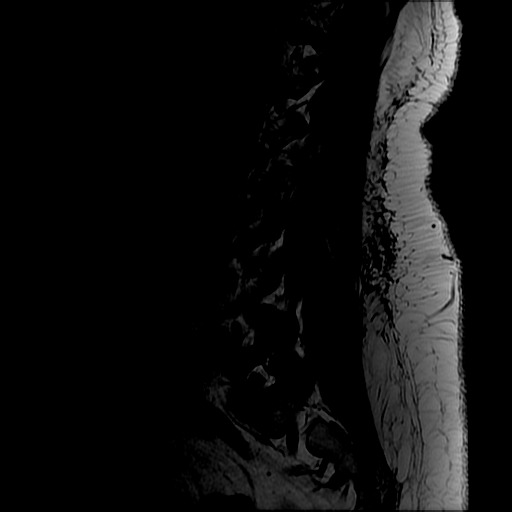
[im 8/13]
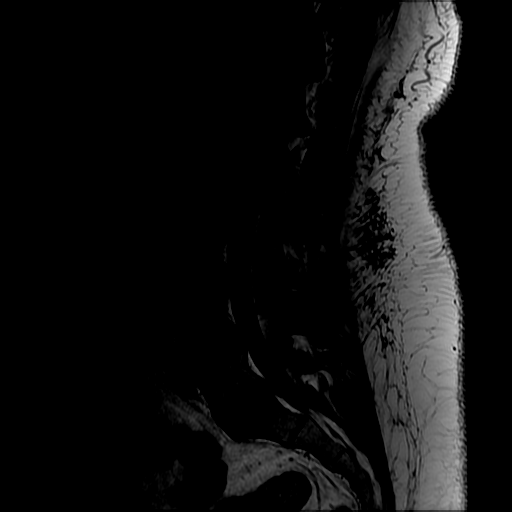
[im 13/13]
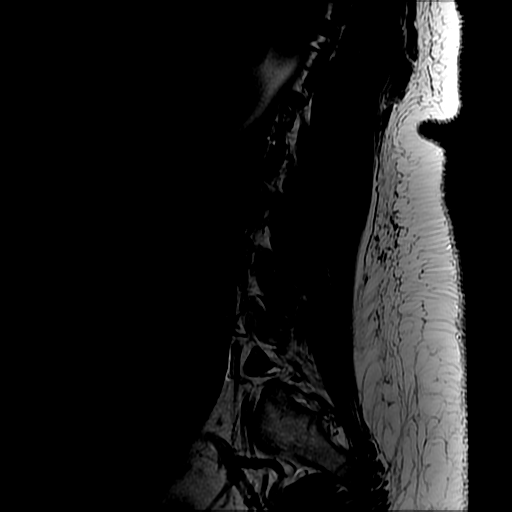

[Series 4: T2 · sagittal · 4.0mm · 0.51mm/px · 6 of 13 slices shown (1 of 2)]
[im 1/13]
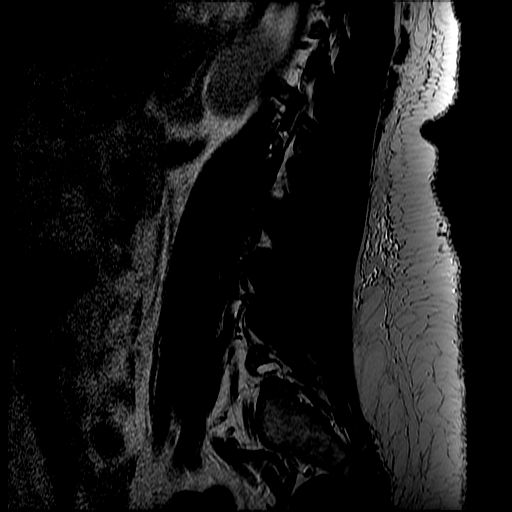
[im 3/13]
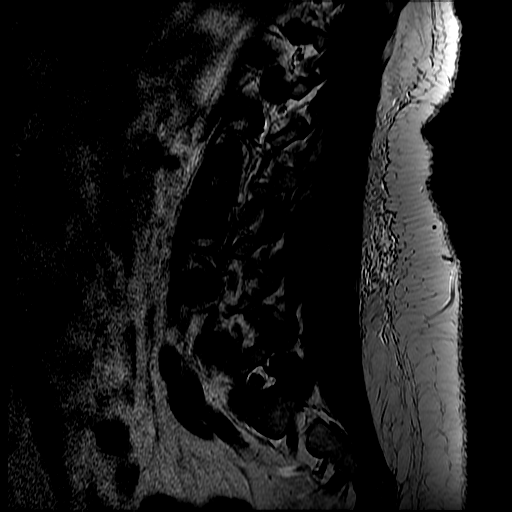
[im 5/13]
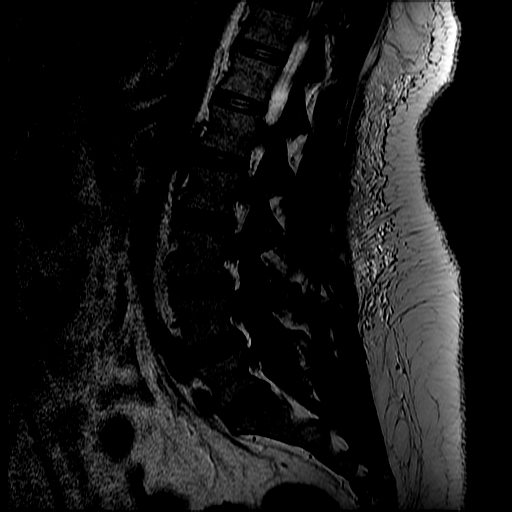
[im 8/13]
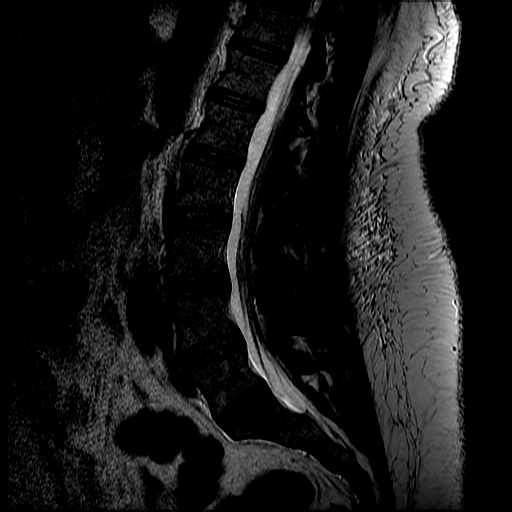
[im 10/13]
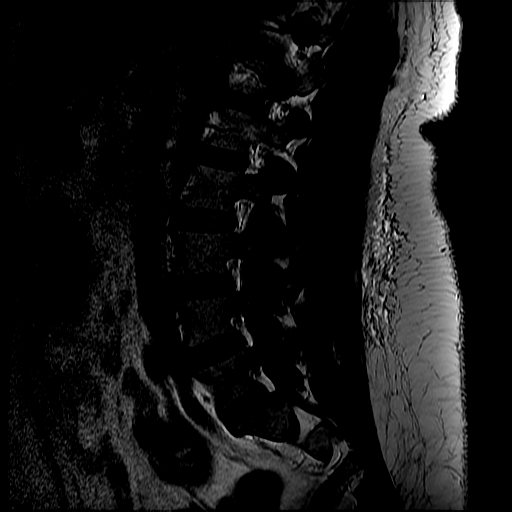
[im 13/13]
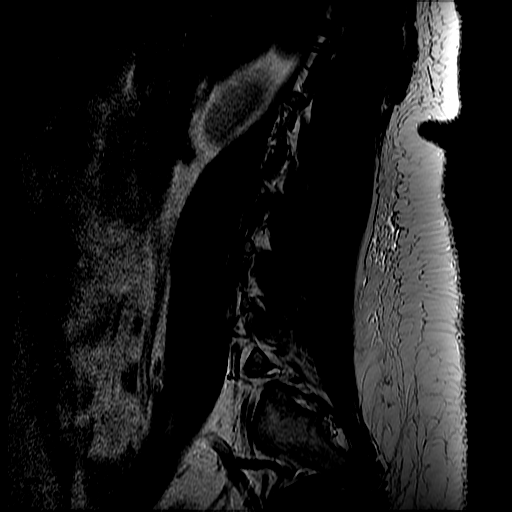

[Series 6: T2 · axial · 4.0mm · 0.39mm/px · z∈[-62,+89]mm · 6 of 31 slices shown (2 of 2)]
[im 1/31]
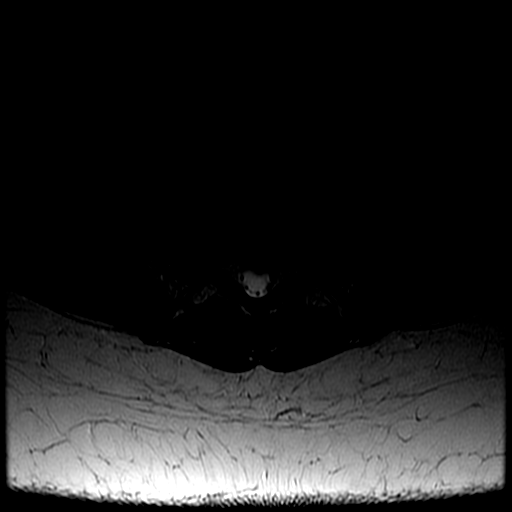
[im 5/31]
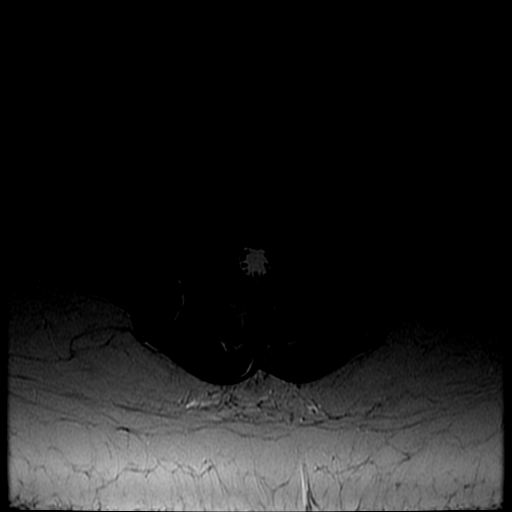
[im 9/31]
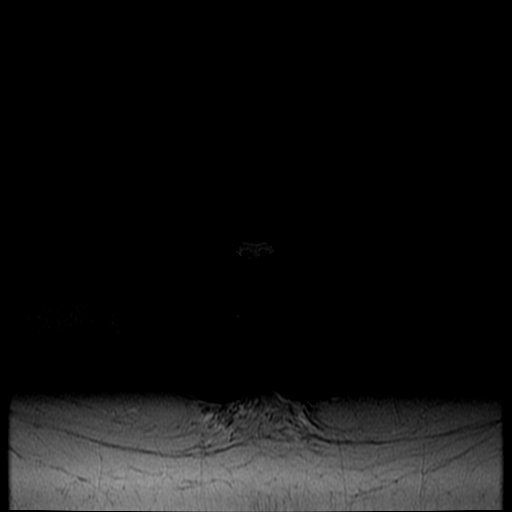
[im 13/31]
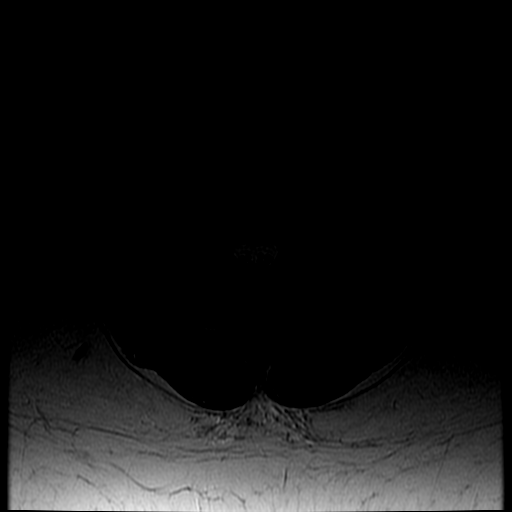
[im 16/31]
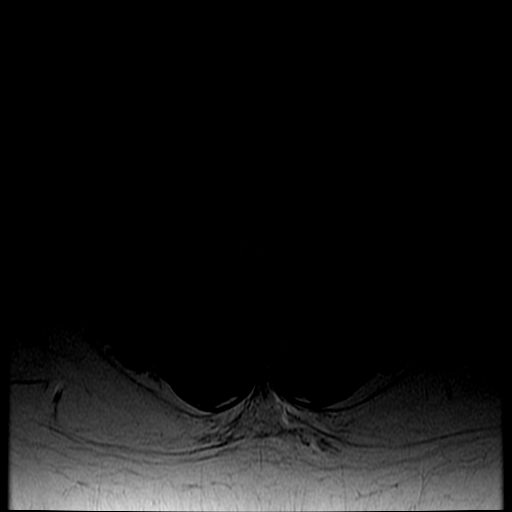
[im 26/31]
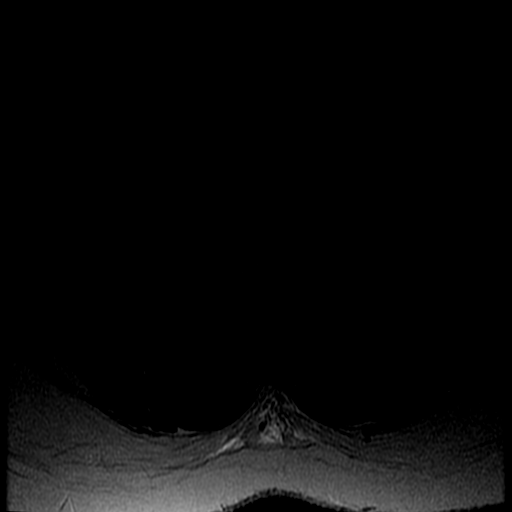

[Series 7: T1 · axial · 4.0mm · 0.39mm/px · z∈[-43,+89]mm · 3 of 31 slices shown (2 of 2)]
[im 5/31]
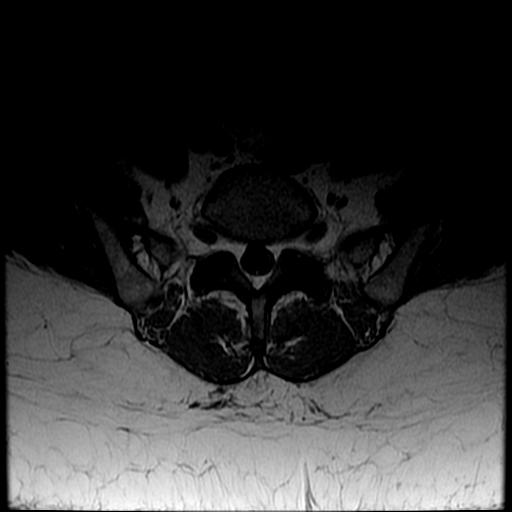
[im 16/31]
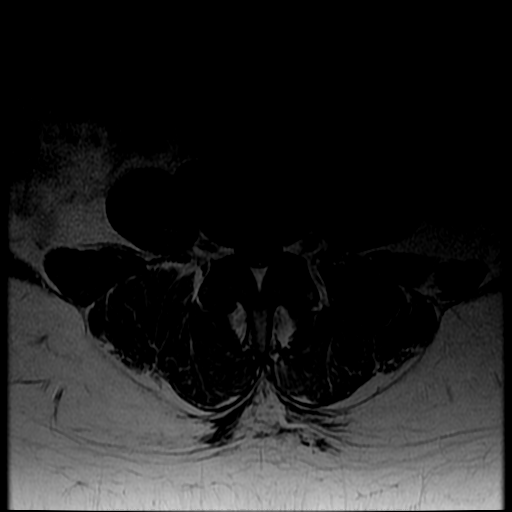
[im 26/31]
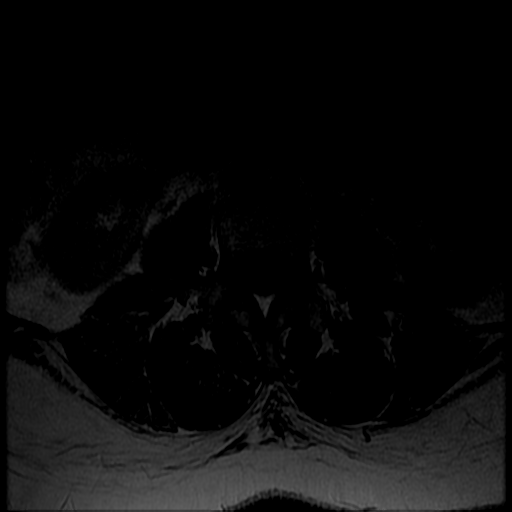

[18 of 48 positions shown; findings below may reference images not displayed]

FINDINGS: Segmentation: Normal segmentation. Lowest well-formed disc is
labeled the L5-S1 level.

Alignment: Vertebral bodies normally aligned with preservation of
the normal lumbar lordosis. No listhesis.

Vertebrae: Vertebral body heights are well maintained. No evidence
for acute or chronic fracture. Signal intensity within the vertebral
body bone marrow within normal limits. No discrete or worrisome
osseous lesions.

Conus medullaris: Extends to the L1-2 level and appears normal.

Paraspinal and other soft tissues: Paraspinous soft tissues within
normal limits. Visualized fissure structures are unremarkable.

Disc levels:

L1-2: Normal interspace. Mild facet and ligamentum flavum
hypertrophy. No canal or neural foraminal stenosis.

L2-3: Normal interspace. Mild facet and ligamentum flavum
hypertrophy. No canal or neural foraminal stenosis.

L3-4: Normal interspace. Moderate facet arthrosis with ligamentum
flavum hypertrophy. No significant canal or neural foraminal
stenosis.

L4-5: Normal interspace. Moderate facet arthrosis with ligamentum
flavum hypertrophy. Mild reactive edema about the bilateral L4-5
facets. No significant canal or neural foraminal stenosis.

L5-S1: Normal interspace. Mild bilateral facet arthrosis. No
significant canal or neural foraminal stenosis.
IMPRESSION: 1. Multilevel mild to moderate facet arthrosis involving the lumbar
spine, most severe at the L4-5 level bilaterally. Finding could
serve as a source for lower back pain.
2. Otherwise unremarkable MRI of the lumbar spine. No significant
degenerative disc disease. No stenosis or evidence for neural
impingement.

## 2019-01-16 NOTE — Telephone Encounter (Signed)
PA denied via insurance. Sent pt [employer].

## 2019-01-27 ENCOUNTER — Telehealth: Payer: Self-pay | Admitting: *Deleted

## 2019-01-27 NOTE — Telephone Encounter (Signed)
Faxed completed/signed Tysabri pt status report and reauth questionnaire to [facility] at [phone]. Received confirmation.   Received fax back from touch prescribing program stating pt re-authorized from 01/27/19-08/26/2019. Pt enrollment # [id number]. Account: [facility]. Site auth # [id number].

## 2019-01-29 NOTE — Telephone Encounter (Signed)
The denial letter was faxed to our office. Tamara [name] had sent pt a mychart message regarding goodrx coupon for discounted lidocaine cream.   If we should choose to appeal, fax within 180 days to [phone].

## 2019-02-16 ENCOUNTER — Other Ambulatory Visit: Payer: Self-pay | Admitting: Neurology

## 2019-02-22 ENCOUNTER — Other Ambulatory Visit: Payer: Self-pay | Admitting: Neurology

## 2019-02-22 DIAGNOSIS — G35 Multiple sclerosis: Secondary | ICD-10-CM

## 2019-03-10 ENCOUNTER — Telehealth: Payer: Self-pay | Admitting: *Deleted

## 2019-03-10 NOTE — Telephone Encounter (Signed)
Received fax from [employer] approved 03/01/19-02/29/20. [id number]. Case number: [id number]. Gave to [facility] for their records since pt receives infusion via them.

## 2019-04-02 ENCOUNTER — Telehealth: Payer: Self-pay | Admitting: Neurology

## 2019-04-02 NOTE — Telephone Encounter (Signed)
Patient called and requested to speak to an office manager in regards to care. Patient refused to give any detailed information.  When switching back over to the patients line I called out 3x and no answer and terminated call.   Please FU

## 2019-04-02 NOTE — Telephone Encounter (Signed)
Patient called and requested to speak to a office manager informed of office managers and she requested to speak with MM as she's familiar with her.

## 2019-04-02 NOTE — Telephone Encounter (Signed)
I spoke to the patient in detail about her infusion. At the end of the dicussion she voiced understanding and will proceed with infusing at [facility] for now.   Emma please call to update her on status sometime tomorrow. Thanks!

## 2019-04-03 NOTE — Telephone Encounter (Signed)
Tried calling pt at [phone] [phone]. Number not working. Called [phone]. Spoke with patient. Provided her [place] phone # and address. She is aware she will be called to get set up. Asked her to call me back if she does not hear about scheduling. She verbalized understanding.

## 2019-04-03 NOTE — Telephone Encounter (Addendum)
[employer] insurance at [phone] and spoke with Leda Quail. Pt ID: [id number]. Request case number: [id number]. Advised we needed to update infusion site location. He directed me on how to print auth change request form to update servicing facility ([email or web address]). I printed off, completed and faxed back to [facility] at [phone]. Received fax confirmation. Waiting on confirmation from them to confirm change and then will fax MD orders to [facility].  I received fax back from [facility] that they received fax and processing,.

## 2019-04-03 NOTE — Telephone Encounter (Signed)
Took call from phone staff and spoke with [facility]. They wanted address/phone/fax of new facility. Provided [place]  address: [address] [place], [place], [place] [address]. Phone: [phone]. Fax: [phone]. She verbalized understanding and updated facility info. Received ok to go ahead and refer pt over there. I faxed signed order to [facility], received fax confirmation.

## 2019-04-10 ENCOUNTER — Telehealth: Payer: Self-pay | Admitting: *Deleted

## 2019-04-10 NOTE — Telephone Encounter (Signed)
Took call from phone staff and spoke with pt. She received covid-19 vaccine yesterday. Has Tysabri infusion scheduled for 04/14/19. Wanted to make sure with MD that she can proceed with infusion after getting vaccine. Placed her on hold and spoke with MD. Dot Been per MD it is ok for her to proceed with infusion as scheduled. She verbalized understanding.

## 2019-04-10 NOTE — Telephone Encounter (Signed)
Pt states she got a COVID shot 3/10 and has been sick since, hurts all over, feels like her breathing is a little labored. She is encouraged to come to ED she then states as long as she is talking shes fine. She does not seem to be short of breath as she speaks. She is encouraged several times that if she feels worse or family thinks she is worse to call 911. She is agreeable

## 2019-04-14 ENCOUNTER — Ambulatory Visit (HOSPITAL_COMMUNITY)
Admission: RE | Admit: 2019-04-14 | Discharge: 2019-04-14 | Disposition: A | Discharge status: Home / Self Care | Payer: Medicaid Other | Source: Ambulatory Visit | Attending: Internal Medicine | Admitting: Internal Medicine

## 2019-04-14 ENCOUNTER — Telehealth: Payer: Self-pay | Admitting: *Deleted

## 2019-04-14 ENCOUNTER — Other Ambulatory Visit: Payer: Self-pay

## 2019-04-14 DIAGNOSIS — G35 Multiple sclerosis: Secondary | ICD-10-CM | POA: Insufficient documentation

## 2019-04-14 MED ORDER — DIPHENHYDRAMINE HCL 25 MG PO CAPS: 25.0000 mg | ORAL_CAPSULE | ORAL | Status: DC | PRN

## 2019-04-14 MED ORDER — ACETAMINOPHEN 325 MG PO TABS: 650.0000 mg | ORAL_TABLET | ORAL | Status: DC | PRN

## 2019-04-14 MED ORDER — SODIUM CHLORIDE 0.9 % IV SOLN
300.0000 mg | INTRAVENOUS | Status: DC
  Administered 2019-04-14: 300 mg via INTRAVENOUS
  Filled 2019-04-14: qty 15

## 2019-04-14 MED ORDER — SODIUM CHLORIDE 0.9 % IV SOLN
INTRAVENOUS | Status: DC | PRN
  Administered 2019-04-14: 250 mL via INTRAVENOUS

## 2019-04-14 NOTE — Telephone Encounter (Signed)
Received fax back from touch prescribing program stating pt re-authorized from 02/25/2019-08/26/2019. Pt enrollment # [id number]. Account: [facility]. Site auth number: [id number].

## 2019-04-14 NOTE — Discharge Instructions (Signed)
Natalizumab injection What is this medicine? NATALIZUMAB (na ta LIZ you mab) is used to treat relapsing multiple sclerosis. This drug is not a cure. It is also used to treat Crohn's disease. This medicine may be used for other purposes; ask your health care provider or pharmacist if you have questions. COMMON BRAND NAME(S): Tysabri What should I tell my health care provider before I take this medicine? They need to know if you have any of these conditions:  immune system problems  progressive multifocal leukoencephalopathy (PML)  an unusual or allergic reaction to natalizumab, other medicines, foods, dyes, or preservatives  pregnant or trying to get pregnant  breast-feeding How should I use this medicine? This medicine is for infusion into a vein. It is given by a health care professional in a hospital or clinic setting. A special MedGuide will be given to you by the pharmacist with each prescription and refill. Be sure to read this information carefully each time. Talk to your pediatrician regarding the use of this medicine in children. This medicine is not approved for use in children. Overdosage: If you think you have taken too much of this medicine contact a poison control center or emergency room at once. NOTE: This medicine is only for you. Do not share this medicine with others. What if I miss a dose? It is important not to miss your dose. Call your doctor or health care professional if you are unable to keep an appointment. What may interact with this medicine? Do not take this medicine with any of the following medications:  biologic medicines such as adalimumab, certolizumab, etanercept, golimumab, infliximab This medicine may also interact with the following medications:  azathioprine  cyclosporine  interferons  6-mercaptopurine  methotrexate  other medicines that lower your chance of fighting an infection  steroid medicines like prednisone or  cortisone  vaccines This list may not describe all possible interactions. Give your health care provider a list of all the medicines, herbs, non-prescription drugs, or dietary supplements you use. Also tell them if you smoke, drink alcohol, or use illegal drugs. Some items may interact with your medicine. What should I watch for while using this medicine? Your condition will be monitored carefully while you are receiving this medicine. Visit your doctor for regular check ups. Tell your doctor or healthcare professional if your symptoms do not start to get better or if they get worse. Stay away from people who are sick. Call your doctor or health care professional for advice if you get a fever, chills or sore throat, or other symptoms of a cold or flu. Do not treat yourself. In some patients, this medicine may cause a serious brain infection that may cause death. If you have any problems seeing, thinking, speaking, walking, or standing, tell your doctor right away. If you cannot reach your doctor, get urgent medical care. What side effects may I notice from receiving this medicine? Side effects that you should report to your doctor or health care professional as soon as possible:  allergic reactions like skin rash, itching or hives, swelling of the face, lips, or tongue  breathing problems  changes in vision  chest pain  confusion  depressed mood  dizziness  feeling faint; lightheaded; falls  general ill feeling or flu-like symptoms  loss of memory  missed menstrual periods  muscle weakness  problems with balance, talking, or walking  signs and symptoms of liver injury like dark yellow or brown urine; general ill feeling or flu-like symptoms; light-colored  stools; loss of appetite; nausea; right upper belly pain; unusually weak or tired; yellowing of the eyes or skin  suicidal thoughts, mood changes  unusual bruising or bleeding  unusually weak or tired Side effects that  usually do not require medical attention (report to your doctor or health care professional if they continue or are bothersome):  headache  joint pain  muscle cramps  muscle pain  nausea, vomiting  pain, redness, or irritation at site where injected  tiredness This list may not describe all possible side effects. Call your doctor for medical advice about side effects. You may report side effects to FDA at 1-800-FDA-1088. Where should I keep my medicine? This drug is given in a hospital or clinic and will not be stored at home. NOTE: This sheet is a summary. It may not cover all possible information. If you have questions about this medicine, talk to your doctor, pharmacist, or health care provider.  2020 Elsevier/Gold Standard (2018-07-22 13:20:26)  

## 2019-04-14 NOTE — Progress Notes (Signed)
Patient received via IV Tysabri as ordered by Arlice Colt MD. Declined the 60 minutes post infusion observation. Tolerated well, vitals stable, discharge instructions given, verbalized understanding. Patient alert, oriented and ambulatory at the time of discharge.

## 2019-05-12 ENCOUNTER — Other Ambulatory Visit: Payer: Self-pay

## 2019-05-12 ENCOUNTER — Ambulatory Visit (HOSPITAL_COMMUNITY)
Admission: RE | Admit: 2019-05-12 | Discharge: 2019-05-12 | Disposition: A | Discharge status: Home / Self Care | Payer: Self-pay | Source: Ambulatory Visit | Attending: Internal Medicine | Admitting: Internal Medicine

## 2019-05-12 DIAGNOSIS — G35 Multiple sclerosis: Secondary | ICD-10-CM | POA: Insufficient documentation

## 2019-05-12 MED ORDER — SODIUM CHLORIDE 0.9 % IV SOLN
300.0000 mg | INTRAVENOUS | Status: DC
  Administered 2019-05-12: 300 mg via INTRAVENOUS
  Filled 2019-05-12: qty 15

## 2019-05-12 MED ORDER — ACETAMINOPHEN 325 MG PO TABS: 650.0000 mg | ORAL_TABLET | ORAL | Status: DC | PRN

## 2019-05-12 MED ORDER — DIPHENHYDRAMINE HCL 25 MG PO CAPS: 25.0000 mg | ORAL_CAPSULE | ORAL | Status: DC | PRN

## 2019-05-12 MED ORDER — SODIUM CHLORIDE 0.9 % IV SOLN
INTRAVENOUS | Status: DC | PRN
  Administered 2019-05-12: 250 mL via INTRAVENOUS

## 2019-05-12 NOTE — Progress Notes (Signed)
PATIENT CARE CENTER NOTE  Diagnosis: Multiple Sclerosis G35   Provider: Arlice Colt, MD   Procedure: Tysabri infusion    Note: Patient received Tysabri infusion via PIV. Tolerated well with no adverse reaction. Observed patient for 1 hour post-infusion. Vital signs stable. Discharge instructions given. Patient alert, oriented and ambulatory at discharge.

## 2019-05-12 NOTE — Discharge Instructions (Signed)
Natalizumab injection What is this medicine? NATALIZUMAB (na ta LIZ you mab) is used to treat relapsing multiple sclerosis. This drug is not a cure. It is also used to treat Crohn's disease. This medicine may be used for other purposes; ask your health care provider or pharmacist if you have questions. COMMON BRAND NAME(S): Tysabri What should I tell my health care provider before I take this medicine? They need to know if you have any of these conditions:  immune system problems  progressive multifocal leukoencephalopathy (PML)  an unusual or allergic reaction to natalizumab, other medicines, foods, dyes, or preservatives  pregnant or trying to get pregnant  breast-feeding How should I use this medicine? This medicine is for infusion into a vein. It is given by a health care professional in a hospital or clinic setting. A special MedGuide will be given to you by the pharmacist with each prescription and refill. Be sure to read this information carefully each time. Talk to your pediatrician regarding the use of this medicine in children. This medicine is not approved for use in children. Overdosage: If you think you have taken too much of this medicine contact a poison control center or emergency room at once. NOTE: This medicine is only for you. Do not share this medicine with others. What if I miss a dose? It is important not to miss your dose. Call your doctor or health care professional if you are unable to keep an appointment. What may interact with this medicine? Do not take this medicine with any of the following medications:  biologic medicines such as adalimumab, certolizumab, etanercept, golimumab, infliximab This medicine may also interact with the following medications:  azathioprine  cyclosporine  interferons  6-mercaptopurine  methotrexate  other medicines that lower your chance of fighting an infection  steroid medicines like prednisone or  cortisone  vaccines This list may not describe all possible interactions. Give your health care provider a list of all the medicines, herbs, non-prescription drugs, or dietary supplements you use. Also tell them if you smoke, drink alcohol, or use illegal drugs. Some items may interact with your medicine. What should I watch for while using this medicine? Your condition will be monitored carefully while you are receiving this medicine. Visit your doctor for regular check ups. Tell your doctor or healthcare professional if your symptoms do not start to get better or if they get worse. Stay away from people who are sick. Call your doctor or health care professional for advice if you get a fever, chills or sore throat, or other symptoms of a cold or flu. Do not treat yourself. In some patients, this medicine may cause a serious brain infection that may cause death. If you have any problems seeing, thinking, speaking, walking, or standing, tell your doctor right away. If you cannot reach your doctor, get urgent medical care. What side effects may I notice from receiving this medicine? Side effects that you should report to your doctor or health care professional as soon as possible:  allergic reactions like skin rash, itching or hives, swelling of the face, lips, or tongue  breathing problems  changes in vision  chest pain  confusion  depressed mood  dizziness  feeling faint; lightheaded; falls  general ill feeling or flu-like symptoms  loss of memory  missed menstrual periods  muscle weakness  problems with balance, talking, or walking  signs and symptoms of liver injury like dark yellow or brown urine; general ill feeling or flu-like symptoms; light-colored   stools; loss of appetite; nausea; right upper belly pain; unusually weak or tired; yellowing of the eyes or skin  suicidal thoughts, mood changes  unusual bruising or bleeding  unusually weak or tired Side effects that  usually do not require medical attention (report to your doctor or health care professional if they continue or are bothersome):  headache  joint pain  muscle cramps  muscle pain  nausea, vomiting  pain, redness, or irritation at site where injected  tiredness This list may not describe all possible side effects. Call your doctor for medical advice about side effects. You may report side effects to FDA at 1-800-FDA-1088. Where should I keep my medicine? This drug is given in a hospital or clinic and will not be stored at home. NOTE: This sheet is a summary. It may not cover all possible information. If you have questions about this medicine, talk to your doctor, pharmacist, or health care provider.  2020 Elsevier/Gold Standard (2018-07-22 13:20:26)  

## 2019-06-09 ENCOUNTER — Other Ambulatory Visit: Payer: Self-pay

## 2019-06-09 ENCOUNTER — Ambulatory Visit (HOSPITAL_COMMUNITY)
Admission: RE | Admit: 2019-06-09 | Discharge: 2019-06-09 | Disposition: A | Discharge status: Home / Self Care | Payer: Self-pay | Source: Ambulatory Visit | Attending: Internal Medicine | Admitting: Internal Medicine

## 2019-06-09 DIAGNOSIS — G35 Multiple sclerosis: Secondary | ICD-10-CM | POA: Insufficient documentation

## 2019-06-09 MED ORDER — SODIUM CHLORIDE 0.9 % IV SOLN
INTRAVENOUS | Status: DC | PRN
  Administered 2019-06-09: 250 mL via INTRAVENOUS

## 2019-06-09 MED ORDER — SODIUM CHLORIDE 0.9 % IV SOLN
300.0000 mg | INTRAVENOUS | Status: DC
  Administered 2019-06-09: 300 mg via INTRAVENOUS
  Filled 2019-06-09: qty 15

## 2019-06-09 NOTE — Progress Notes (Addendum)
PATIENT CARE CENTER NOTE  Diagnosis:Multiple Sclerosis G35   Provider:Sater, Richard, MD   Procedure:Tysabri infusion   Note:Patient received Tysabri infusion via PIV. Tolerated well with no adverse reaction. Patient declined 1 hour observation post-infusion. Vital signs stable. Discharge instructions given. Patient alert, oriented and ambulatory at discharge. 

## 2019-06-09 NOTE — Discharge Instructions (Signed)
Natalizumab injection What is this medicine? NATALIZUMAB (na ta LIZ you mab) is used to treat relapsing multiple sclerosis. This drug is not a cure. It is also used to treat Crohn's disease. This medicine may be used for other purposes; ask your health care provider or pharmacist if you have questions. COMMON BRAND NAME(S): Tysabri What should I tell my health care provider before I take this medicine? They need to know if you have any of these conditions:  immune system problems  progressive multifocal leukoencephalopathy (PML)  an unusual or allergic reaction to natalizumab, other medicines, foods, dyes, or preservatives  pregnant or trying to get pregnant  breast-feeding How should I use this medicine? This medicine is for infusion into a vein. It is given by a health care professional in a hospital or clinic setting. A special MedGuide will be given to you by the pharmacist with each prescription and refill. Be sure to read this information carefully each time. Talk to your pediatrician regarding the use of this medicine in children. This medicine is not approved for use in children. Overdosage: If you think you have taken too much of this medicine contact a poison control center or emergency room at once. NOTE: This medicine is only for you. Do not share this medicine with others. What if I miss a dose? It is important not to miss your dose. Call your doctor or health care professional if you are unable to keep an appointment. What may interact with this medicine? Do not take this medicine with any of the following medications:  biologic medicines such as adalimumab, certolizumab, etanercept, golimumab, infliximab This medicine may also interact with the following medications:  azathioprine  cyclosporine  interferons  6-mercaptopurine  methotrexate  other medicines that lower your chance of fighting an infection  steroid medicines like prednisone or  cortisone  vaccines This list may not describe all possible interactions. Give your health care provider a list of all the medicines, herbs, non-prescription drugs, or dietary supplements you use. Also tell them if you smoke, drink alcohol, or use illegal drugs. Some items may interact with your medicine. What should I watch for while using this medicine? Your condition will be monitored carefully while you are receiving this medicine. Visit your doctor for regular check ups. Tell your doctor or healthcare professional if your symptoms do not start to get better or if they get worse. Stay away from people who are sick. Call your doctor or health care professional for advice if you get a fever, chills or sore throat, or other symptoms of a cold or flu. Do not treat yourself. In some patients, this medicine may cause a serious brain infection that may cause death. If you have any problems seeing, thinking, speaking, walking, or standing, tell your doctor right away. If you cannot reach your doctor, get urgent medical care. What side effects may I notice from receiving this medicine? Side effects that you should report to your doctor or health care professional as soon as possible:  allergic reactions like skin rash, itching or hives, swelling of the face, lips, or tongue  breathing problems  changes in vision  chest pain  confusion  depressed mood  dizziness  feeling faint; lightheaded; falls  general ill feeling or flu-like symptoms  loss of memory  missed menstrual periods  muscle weakness  problems with balance, talking, or walking  signs and symptoms of liver injury like dark yellow or brown urine; general ill feeling or flu-like symptoms; light-colored  stools; loss of appetite; nausea; right upper belly pain; unusually weak or tired; yellowing of the eyes or skin  suicidal thoughts, mood changes  unusual bruising or bleeding  unusually weak or tired Side effects that  usually do not require medical attention (report to your doctor or health care professional if they continue or are bothersome):  headache  joint pain  muscle cramps  muscle pain  nausea, vomiting  pain, redness, or irritation at site where injected  tiredness This list may not describe all possible side effects. Call your doctor for medical advice about side effects. You may report side effects to FDA at 1-800-FDA-1088. Where should I keep my medicine? This drug is given in a hospital or clinic and will not be stored at home. NOTE: This sheet is a summary. It may not cover all possible information. If you have questions about this medicine, talk to your doctor, pharmacist, or health care provider.  2020 Elsevier/Gold Standard (2018-07-22 13:20:26)  

## 2019-06-13 ENCOUNTER — Other Ambulatory Visit: Payer: Self-pay | Admitting: Neurology

## 2019-06-18 ENCOUNTER — Ambulatory Visit: Payer: Medicaid Other

## 2019-06-19 ENCOUNTER — Telehealth: Payer: Self-pay | Admitting: Internal Medicine

## 2019-06-19 ENCOUNTER — Encounter: Payer: Self-pay | Admitting: Internal Medicine

## 2019-06-19 NOTE — Telephone Encounter (Signed)
   Reason for call:   I received a call from [name] at 4:45 PM indicating left forearm pain and swelling at injection site, and that she is quarentined.   Pertinent Data:   This is a 59 year old female with a history of multiple sclerosis on Tysabri infusion every 4 weeks.  She went to get her infusion last month but there was difficulty getting access and they needed to call the IV team at the infusion center.  She reports that they put a substance, that she thinks was an antibacterial substance, for the area after they placed the IV.  She then developed bruising and reports that it seems to be worsening and enlarging.  The bruising is located over her left arm, in the middle area. There are 2 large spots, color is red/black/blue, size of the palm of her hand, getting black around where she had the injection site, reports it seems like "frog eyes", there was blood that was draining out, no obvious pus. She reports subjective fevers, generalized fatigue, feeling worn out, chills, and some nausea.  Also reports some warmth over the area.  Also endorses a dry cough. Denies vomiting, SOB or CP.  Reports that she had an appointment yesterday but that she was not able to make it, she tried to call but wouldn't be able to make it. Her daughter was at school and someone was diagnosed with COVID and was wanting to know if she was able ot come in, she was told to quarantine until 5/28.   She tried calling the short stay and injection center to see if they could help her, she also got in touch with the IV team at that location.  She was advised to get in touch with her primary care provider to discuss this.   Assessment / Plan / Recommendations:   Advised patient to schedule virtual visit for [place] to assess for wound, she is unable to come in due to her being quarantined at this time.  Differential for her right arm wound includes cellulitis, phlebitis, infusion related reaction, or extravasation.   Could consider antibiotics if more concerning for cellulitis.  Will send message to front desk to schedule virtual visit.  As always, pt is advised that if symptoms worsen or new symptoms arise, they should go to an urgent care facility or to to ER for further evaluation.   Asencion Noble, MD   06/19/2019, 4:47 PM

## 2019-06-20 NOTE — Telephone Encounter (Signed)
Called patient N/a and unable to leave a message.  Will try again.

## 2019-06-24 NOTE — Telephone Encounter (Signed)
I have called patient again and no answer.

## 2019-06-24 NOTE — Telephone Encounter (Signed)
Thank you, please let me know if you reach her.

## 2019-06-25 ENCOUNTER — Other Ambulatory Visit: Payer: Self-pay

## 2019-06-25 ENCOUNTER — Encounter: Payer: Self-pay | Admitting: Internal Medicine

## 2019-06-25 ENCOUNTER — Telehealth (INDEPENDENT_AMBULATORY_CARE_PROVIDER_SITE_OTHER): Payer: Self-pay | Admitting: Internal Medicine

## 2019-06-25 DIAGNOSIS — I809 Phlebitis and thrombophlebitis of unspecified site: Secondary | ICD-10-CM

## 2019-06-25 NOTE — Telephone Encounter (Signed)
Spoke with the patient this morning.  She has sch a virtual appt for this afternoon @ 2:45 pm with [facility].

## 2019-06-25 NOTE — Progress Notes (Signed)
   CC: left forearm pain  This is a telephone encounter between [place] and [place] on 06/25/2019 for left forearm pain. The visit was conducted with the patient located at home and [place] at [facility]. The patient's identity was confirmed using their DOB and current address. The patient has consented to being evaluated through a telephone encounter and understands the associated risks (an examination cannot be done and the patient may need to come in for an appointment) / benefits (allows the patient to remain at home, decreasing exposure to coronavirus). I personally spent 15 minutes on medical discussion.   HPI:  [name] is a 59 y.o. with PMH as below.   Please see A&P for assessment of the patient's acute and chronic medical conditions.   Past Medical History:  Diagnosis Date  . Anemia   . Bowel obstruction ([place])   . GERD (gastroesophageal reflux disease)   . IBS (irritable bowel syndrome)    at age of 13  . Multiple sclerosis ([place])   . Sciatica 2009  . Vision abnormalities    Review of Systems:  Review of Systems  Constitutional: Negative for chills, diaphoresis, fever and malaise/fatigue.  Musculoskeletal: Positive for myalgias. Negative for back pain and joint pain.  Skin: Negative for itching and rash.  Neurological: Negative for dizziness, tingling, sensory change, speech change and headaches.               Assessment & Plan:   See Encounters Tab for problem based charting.  Patient discussed with Dr. Evette Doffing

## 2019-06-27 DIAGNOSIS — I809 Phlebitis and thrombophlebitis of unspecified site: Secondary | ICD-10-CM | POA: Insufficient documentation

## 2019-06-27 NOTE — Assessment & Plan Note (Signed)
Patient with history of MS for which she receives Tysabri infusions presenting with concerns of bruising and tenderness at site of IV insertion on left arm during her last transfusion on 5/10. Patient reports that there was difficulty getting access for which IV team was called at the infusion center and they put an antibacterial gel on the area after placing the IV. She notes that following this, she noted discoloration developing around the IV insertion site whens he returned home that progressively worsened. She did note some initial bloody drainage from site of IV insertion that has since resolved. She notes that the discoloration has also mostly resolved but still has some tenderness at the site of insertion. She does endorse generalized fatigue and feeling worn out but notes that this is common following her infusion therapy. Left arm examined over video. The discoloration appears to have resolved and there is small punctate lesion at IV site. No significant edema or erythema noted of the left arm. Unlikely that she has cellulitis at this time; suspect superficial phlebitis. Recommended for warm compresses and tylenol for pain relief. Advised that if worsening symptoms, would need to evaluate in clinic. Patient expresses understanding.

## 2019-07-01 NOTE — Progress Notes (Signed)
Internal Medicine Clinic Attending ° °Case discussed with Dr. Aslam  at the time of the visit.  We reviewed the resident’s history and pertinent patient test results.  I agree with the assessment, diagnosis, and plan of care documented in the resident’s note.  °

## 2019-07-07 ENCOUNTER — Telehealth: Payer: Self-pay | Admitting: Neurology

## 2019-07-07 DIAGNOSIS — G35 Multiple sclerosis: Secondary | ICD-10-CM

## 2019-07-07 NOTE — Telephone Encounter (Signed)
Called pts to r/s her upcoming apt- pt requesting a call from RN stating she was unsure if Dr. Felecia Shelling wanted her to have any MRI's before her appt on the 25th please advise

## 2019-07-07 NOTE — Telephone Encounter (Signed)
Called pt back. She did not complete MRI brain/cervical that Dr. Felecia Shelling ordered 12/2018. Order now expired. Advised we will place new orders and she can try and get scheduled prior to her appt with Dr. Felecia Shelling. She verbalized understanding.

## 2019-07-08 ENCOUNTER — Ambulatory Visit (HOSPITAL_COMMUNITY): Payer: Medicaid Other

## 2019-07-08 ENCOUNTER — Telehealth: Payer: Self-pay | Admitting: Internal Medicine

## 2019-07-08 NOTE — Telephone Encounter (Signed)
Ok, thank you for the update Bonnita Nasuti.

## 2019-07-08 NOTE — Telephone Encounter (Signed)
Patient requesting a call back about having shortness breath, swelling in feet, Left side of chest hurting.

## 2019-07-08 NOTE — Telephone Encounter (Signed)
rtc to pt, she states she is feeling bad, short of breath, heaviness in her chest SHE REFUSES ED OR 911. States she spoke w/ her infusion nurse at [facility] and he is concerned over her recent problems w/ IV stick. Triage nurse talked her and pleaded for her to go to ED and call 911 but she adamantly REFUSES.  ACC 6/9 at 0945 She was ask if she feels worse, more short of breath or more chest discomfort to call 911 and she is agreeable.

## 2019-07-09 ENCOUNTER — Inpatient Hospital Stay (HOSPITAL_COMMUNITY)
Admission: AD | Admit: 2019-07-09 | Discharge: 2019-07-11 | DRG: 948 | Disposition: A | Discharge status: Home / Self Care | Payer: 59 | Source: Ambulatory Visit | Attending: Internal Medicine | Admitting: Internal Medicine

## 2019-07-09 ENCOUNTER — Ambulatory Visit: Payer: 59 | Admitting: Internal Medicine

## 2019-07-09 ENCOUNTER — Other Ambulatory Visit: Payer: Self-pay

## 2019-07-09 ENCOUNTER — Inpatient Hospital Stay (HOSPITAL_COMMUNITY): Payer: 59

## 2019-07-09 ENCOUNTER — Encounter (HOSPITAL_COMMUNITY): Payer: Self-pay | Admitting: Internal Medicine

## 2019-07-09 DIAGNOSIS — R0683 Snoring: Secondary | ICD-10-CM | POA: Diagnosis present

## 2019-07-09 DIAGNOSIS — E8779 Other fluid overload: Secondary | ICD-10-CM | POA: Diagnosis not present

## 2019-07-09 DIAGNOSIS — Z90710 Acquired absence of both cervix and uterus: Secondary | ICD-10-CM | POA: Diagnosis not present

## 2019-07-09 DIAGNOSIS — M5430 Sciatica, unspecified side: Secondary | ICD-10-CM | POA: Diagnosis present

## 2019-07-09 DIAGNOSIS — R0600 Dyspnea, unspecified: Secondary | ICD-10-CM | POA: Diagnosis not present

## 2019-07-09 DIAGNOSIS — R0789 Other chest pain: Secondary | ICD-10-CM | POA: Diagnosis present

## 2019-07-09 DIAGNOSIS — I272 Pulmonary hypertension, unspecified: Secondary | ICD-10-CM | POA: Diagnosis present

## 2019-07-09 DIAGNOSIS — I509 Heart failure, unspecified: Secondary | ICD-10-CM

## 2019-07-09 DIAGNOSIS — Z20822 Contact with and (suspected) exposure to covid-19: Secondary | ICD-10-CM | POA: Diagnosis present

## 2019-07-09 DIAGNOSIS — R609 Edema, unspecified: Principal | ICD-10-CM | POA: Diagnosis present

## 2019-07-09 DIAGNOSIS — D649 Anemia, unspecified: Secondary | ICD-10-CM | POA: Diagnosis present

## 2019-07-09 DIAGNOSIS — R0602 Shortness of breath: Secondary | ICD-10-CM | POA: Diagnosis not present

## 2019-07-09 DIAGNOSIS — I129 Hypertensive chronic kidney disease with stage 1 through stage 4 chronic kidney disease, or unspecified chronic kidney disease: Secondary | ICD-10-CM | POA: Diagnosis present

## 2019-07-09 DIAGNOSIS — Z8249 Family history of ischemic heart disease and other diseases of the circulatory system: Secondary | ICD-10-CM | POA: Diagnosis not present

## 2019-07-09 DIAGNOSIS — Z79891 Long term (current) use of opiate analgesic: Secondary | ICD-10-CM | POA: Diagnosis not present

## 2019-07-09 DIAGNOSIS — N182 Chronic kidney disease, stage 2 (mild): Secondary | ICD-10-CM | POA: Diagnosis present

## 2019-07-09 DIAGNOSIS — E8770 Fluid overload, unspecified: Secondary | ICD-10-CM | POA: Diagnosis present

## 2019-07-09 DIAGNOSIS — K219 Gastro-esophageal reflux disease without esophagitis: Secondary | ICD-10-CM | POA: Diagnosis present

## 2019-07-09 DIAGNOSIS — G35 Multiple sclerosis: Secondary | ICD-10-CM | POA: Diagnosis present

## 2019-07-09 DIAGNOSIS — Z79899 Other long term (current) drug therapy: Secondary | ICD-10-CM

## 2019-07-09 DIAGNOSIS — K589 Irritable bowel syndrome without diarrhea: Secondary | ICD-10-CM | POA: Diagnosis present

## 2019-07-09 DIAGNOSIS — I4510 Unspecified right bundle-branch block: Secondary | ICD-10-CM | POA: Diagnosis present

## 2019-07-09 DIAGNOSIS — R0601 Orthopnea: Secondary | ICD-10-CM | POA: Diagnosis present

## 2019-07-09 HISTORY — DX: Unspecified right bundle-branch block: I45.10

## 2019-07-09 HISTORY — DX: Chronic kidney disease, stage 2 (mild): N18.2

## 2019-07-09 LAB — CBC
HCT: 34.6 % — ABNORMAL LOW (ref 36.0–46.0)
Hemoglobin: 10.8 g/dL — ABNORMAL LOW (ref 12.0–15.0)
MCH: 27.8 pg (ref 26.0–34.0)
MCHC: 31.2 g/dL (ref 30.0–36.0)
MCV: 88.9 fL (ref 80.0–100.0)
Platelets: 176 10*3/uL (ref 150–400)
RBC: 3.89 MIL/uL (ref 3.87–5.11)
RDW: 14.8 % (ref 11.5–15.5)
WBC: 6.9 10*3/uL (ref 4.0–10.5)
nRBC: 0.6 % — ABNORMAL HIGH (ref 0.0–0.2)

## 2019-07-09 LAB — COMPREHENSIVE METABOLIC PANEL
ALT: 15 U/L (ref 0–44)
AST: 16 U/L (ref 15–41)
Albumin: 3.8 g/dL (ref 3.5–5.0)
Alkaline Phosphatase: 59 U/L (ref 38–126)
Anion gap: 7 (ref 5–15)
BUN: 15 mg/dL (ref 6–20)
CO2: 24 mmol/L (ref 22–32)
Calcium: 8.9 mg/dL (ref 8.9–10.3)
Chloride: 112 mmol/L — ABNORMAL HIGH (ref 98–111)
Creatinine, Ser: 1.11 mg/dL — ABNORMAL HIGH (ref 0.44–1.00)
GFR calc Af Amer: >60 mL/min (ref >60)
GFR calc non Af Amer: 55 mL/min — ABNORMAL LOW (ref >60)
Glucose, Bld: 91 mg/dL (ref 70–99)
Potassium: 3.7 mmol/L (ref 3.5–5.1)
Sodium: 143 mmol/L (ref 135–145)
Total Bilirubin: 0.5 mg/dL (ref 0.3–1.2)
Total Protein: 6.2 g/dL — ABNORMAL LOW (ref 6.5–8.1)

## 2019-07-09 LAB — MAGNESIUM: Magnesium: 2.4 mg/dL (ref 1.7–2.4)

## 2019-07-09 LAB — HIV ANTIBODY (ROUTINE TESTING W REFLEX): HIV Screen 4th Generation wRfx: NONREACTIVE

## 2019-07-09 LAB — SARS CORONAVIRUS 2 BY RT PCR (HOSPITAL ORDER, PERFORMED IN ~~LOC~~ HOSPITAL LAB): SARS Coronavirus 2: NEGATIVE

## 2019-07-09 LAB — BRAIN NATRIURETIC PEPTIDE: B Natriuretic Peptide: 24.1 pg/mL (ref 0.0–100.0)

## 2019-07-09 MED ORDER — PANTOPRAZOLE SODIUM 40 MG PO TBEC
40.0000 mg | DELAYED_RELEASE_TABLET | Freq: Every day | ORAL | Status: DC
  Administered 2019-07-10 – 2019-07-11 (×2): 40 mg via ORAL
  Filled 2019-07-09 (×2): qty 1

## 2019-07-09 MED ORDER — SODIUM CHLORIDE 0.9% FLUSH
3.0000 mL | Freq: Two times a day (BID) | INTRAVENOUS | Status: DC
  Administered 2019-07-09 – 2019-07-11 (×4): 3 mL via INTRAVENOUS

## 2019-07-09 MED ORDER — OXYBUTYNIN CHLORIDE 5 MG PO TABS
5.0000 mg | ORAL_TABLET | Freq: Two times a day (BID) | ORAL | Status: DC
  Administered 2019-07-09: 5 mg via ORAL
  Filled 2019-07-09 (×3): qty 1

## 2019-07-09 MED ORDER — HYDROCHLOROTHIAZIDE 25 MG PO TABS: 25.0000 mg | ORAL_TABLET | Freq: Every day | ORAL | Status: DC

## 2019-07-09 MED ORDER — ENOXAPARIN SODIUM 40 MG/0.4ML ~~LOC~~ SOLN
40.0000 mg | SUBCUTANEOUS | Status: DC
  Administered 2019-07-09 – 2019-07-10 (×2): 40 mg via SUBCUTANEOUS
  Filled 2019-07-09 (×2): qty 0.4

## 2019-07-09 MED ORDER — TOPIRAMATE 25 MG PO TABS
50.0000 mg | ORAL_TABLET | Freq: Three times a day (TID) | ORAL | Status: DC
  Administered 2019-07-09 – 2019-07-11 (×5): 50 mg via ORAL
  Filled 2019-07-09 (×5): qty 2

## 2019-07-09 MED ORDER — RAMELTEON 8 MG PO TABS
8.0000 mg | ORAL_TABLET | Freq: Every day | ORAL | Status: DC
  Administered 2019-07-09 – 2019-07-10 (×2): 8 mg via ORAL
  Filled 2019-07-09 (×3): qty 1

## 2019-07-09 MED ORDER — CYCLOBENZAPRINE HCL 5 MG PO TABS
5.0000 mg | ORAL_TABLET | Freq: Three times a day (TID) | ORAL | Status: DC | PRN
  Administered 2019-07-09 – 2019-07-10 (×2): 5 mg via ORAL
  Filled 2019-07-09 (×2): qty 1

## 2019-07-09 NOTE — Assessment & Plan Note (Addendum)
Patient states that she has been experiencing shortness of breath with exertion as well as lower extremity swelling for the last several weeks.  Over the past couple weeks in particular, those things have been getting worse and worse.  She states that she is able to do her basic ADLs at home, but it is increasingly difficult because she feels fatigued and has to sit down to catch her breath. The patient notes that she has been using several pillows to prop her set up at night to help her sleep, or sleep in a recliner due to increased shortness of breath with laying flat.  Patient has tried propping her legs up to help with the swelling, but this has not been very helpful.  Of note, the patient had an echocardiogram done a couple years ago which showed grade 2 diastolic dysfunction, but the patient has never taken any diuretics, or been told that she has heart failure.  On exam, the patient has 2+ pitting edema in bilateral lower extremities up to the midshin in addition to JVD at 9 cm.  Lungs are clear to auscultation bilaterally.   Bedside ultrasound was performed with Dr. Evette Doffing which did not show any evidence of pulmonary edema, but did show an engorged jugular vein, suggesting volume overload.    Given the patient's increased symptom burden, and decreased functional state at home, I am concerned that oral diuresis will not be sufficient and there is a high likelihood that the patient's condition could worsen.  This appears to be a new onset CHF exacerbation that requires hospitalization for IV diuresis.  Patient is in agreement with being hospitalized for further treatment.  Plan: -Direct admit to the hospital once a bed becomes available.  Patient decided to leave the clinic to get her affairs straightened at home with her husband and come to the hospital once a bed is available.

## 2019-07-09 NOTE — Progress Notes (Signed)
Internal Medicine Clinic Attending  I saw and evaluated the patient.  I personally confirmed the key portions of the history and exam documented by Dr. Sheppard Coil and I reviewed pertinent patient test results.  The assessment, diagnosis, and plan were formulated together and I agree with the documentation in the resident's note.  59 year old person living with multiple sclerosis on treatment with Tysabri here for lower extremity swelling. She is volume overloaded on exam, she has 2+ pitting edema in bilateral legs to the knees, she has JVD 4cm above the sternal angle, not much crackles. She has a left sided pleuritic chest pain, on ultrasound there is no pleural effusion, no B lines either. She has no history of clinical CHF, only had LE edema once before and was more mild. She is now having NYHA class 3 symptoms, limiting her daily activities at home, and reports not sleeping for two days because she can no longer lie in bed. We discussed options. Plan is to directly admit her to [facility] for IV lasix diuresis and evaluation of new CHF. There is a little wait for an inpatient bed, the patient's husband is going to take her home this morning, make arrangements, and she will come to admitting later today when a bed is available.

## 2019-07-09 NOTE — Progress Notes (Signed)
   CC: SOB  HPI:  Tamara [name] is a 59 y.o. with a past history as noted below who presents today for SOB and leg swelling. Please refer to the problem based charting for further details.  Past Medical History:  Diagnosis Date  . Anemia   . Bowel obstruction ([place])   . GERD (gastroesophageal reflux disease)   . IBS (irritable bowel syndrome)    at age of 70  . Multiple sclerosis ([place])   . Sciatica 2009  . Vision abnormalities    Review of Systems:  Negative unless mentioned in the HPI.  Physical Exam:  Vitals:   07/09/19 1011  BP: 130/67  Pulse: 84  Temp: 98.8 F (37.1 C)  TempSrc: Oral  SpO2: 100%  Weight: 200 lb 14.4 oz (91.1 kg)  Height: 5\' 3"  (1.6 m)   Physical Exam Constitutional:      General: She is not in acute distress.    Appearance: Normal appearance. She is obese. She is not ill-appearing, toxic-appearing or diaphoretic.  HENT:     Head: Normocephalic and atraumatic.  Cardiovascular:     Rate and Rhythm: Normal rate and regular rhythm.  No extrasystoles are present.    Heart sounds: Murmur present. Crescendo  systolic murmur present with a grade of 2/6.     Comments: +JVD at 9cm  Pulmonary:     Effort: Pulmonary effort is normal. No respiratory distress.     Breath sounds: Normal breath sounds. No wheezing or rales.  Abdominal:     General: Abdomen is flat. Bowel sounds are normal. There is no distension.     Tenderness: There is no abdominal tenderness. There is no guarding.  Musculoskeletal:     Right lower leg: Edema (2+ pitting to the mid shin) present.     Left lower leg: Edema (2+ pitting to the mid shin) present.  Neurological:     General: No focal deficit present.     Mental Status: She is alert and oriented to person, place, and time.  Psychiatric:     Comments: anxious    Assessment & Plan:   See Encounters Tab for problem based charting.  Patient seen with Dr. Evette Doffing

## 2019-07-09 NOTE — H&P (Addendum)
Date: 07/09/2019               Patient Name:  Tamara [name] MRN: [id number]  DOB: 10-16-1960 Age / Sex: 59 y.o., female   PCP: Lucious Groves, DO         Medical Service: Internal Medicine Teaching Service         Attending Physician: Dr. Aldine Contes, MD    First Contact: Dr. Madilyn Fireman Pager: [phone]  Second Contact: Dr. Sharon Seller Pager: [phone]       After Hours (After 5p/  First Contact Pager: [phone]  weekends / holidays): Second Contact Pager: [phone]   Chief Complaint: swelling and shortness of breath  History of Present Illness: Tamara [name] was in her usual state of health until around 2-3 weeks ago when she developed left sided pain in her rib cage near her diaphram, worse with inspiration. A week or so ago she developed left sided swelling in her left arm and leg. She denies any recent plane or car travel or recent surgeries. She later developed swelling in her right leg as well as shortness of breath worse with lying flat with coughing with lying flat at night. She has never felt like this before. On ROS she endorses intermittent fevers and chills. She denies sore throat, chest pain, new or worsening abdominal pain and dysuria.   Meds:  No outpatient medications have been marked as taking for the 07/09/19 encounter [facility] Encounter).    Allergies: Allergies as of 07/09/2019 - Review Complete 07/09/2019  Allergen Reaction Noted  . Codeine Other (See Comments) 06/19/2012  . Dilaudid [hydromorphone hcl] Swelling 06/19/2012  . Paxil [paroxetine hcl] Other (See Comments) 06/19/2012  . Tegretol [carbamazepine] Itching 07/04/2016  . Trileptal [oxcarbazepine] Swelling 07/20/2016  . Adhesive [tape] Hives, Itching, and Rash 07/09/2013  . Augmentin [amoxicillin-pot clavulanate] Rash 06/19/2012  . Gabapentin Rash 07/17/2016   Past Medical History:  Diagnosis Date  . Anemia   . Bowel obstruction ([place])   . GERD (gastroesophageal reflux disease)   . IBS (irritable bowel  syndrome)    at age of 38  . Multiple sclerosis ([place])   . Sciatica 2009  . Vision abnormalities   no hx of heart disease or blood clots  Past Surgical History:  Procedure Laterality Date  . ABDOMINAL HYSTERECTOMY  2008   cervix and right ovary still intact  . KNEE ARTHROSCOPY  2010 and 2011   Left knee, x2    Family History:  Family History  Problem Relation Age of Onset  . Cancer Father        Gallbladder  . Gallbladder disease Father   . Hypertension Brother   . Gallbladder disease Paternal Grandmother   . Colon cancer Neg Hx   . Colon polyps Neg Hx   . Esophageal cancer Neg Hx   . Kidney disease Neg Hx   no family hx of heart disease or blood clots  Social History:  Social History   Tobacco Use  . Smoking status: Never Smoker  . Smokeless tobacco: Never Used  Substance Use Topics  . Alcohol use: No    Alcohol/week: 0.0 standard drinks  . Drug use: No  Never drink or smoker  Review of Systems: A complete ROS was negative except as per HPI.   Physical Exam: Blood pressure 130/74, pulse 77, temperature 98.7 F (37.1 C), temperature source Oral, resp. rate 16, SpO2 100 %. Physical Exam  Constitutional: She is oriented to person, place, and time and  well-developed, well-nourished, and in no distress. No distress.  HENT:  Head: Normocephalic and atraumatic.  Neck:  ?JVD  Cardiovascular: Normal rate, regular rhythm and normal heart sounds.  No murmur heard. Bilateral LE edema, L>R  Pulmonary/Chest: Effort normal and breath sounds normal. No respiratory distress.  Abdominal: Soft. Bowel sounds are normal. She exhibits no distension.  Musculoskeletal:        General: Normal range of motion.  Neurological: She is alert and oriented to person, place, and time.  Skin: Skin is warm and dry. She is not diaphoretic.  Psychiatric: Affect normal.  Nursing note and vitals reviewed.  Assessment:  Tamara [name] is a 59 yo F w/ a PMHx of MS, HTN, GERD and IBS presenting  w/ 2 weeks of LE edema and shortness of breath.  Plan by Problem: Volume Overload: Shortness of Breath: -pt presenting with 2 weeks of LE edema, L>R, orthopnea and paroxysmal nocturnal dyspnea  -she originally presented to the outpatient clinic and due to concern for new heart failure she was admitted for further work up -story unusual in that her left leg swelled prior to her right; she also endorses some right arm swelling -pt denies recent travel or sedentary behavior -she denies personal and family hx of blood clots -she does report left sided pleuritic pain as well, however, this preceded her swelling -patient not tachycardic and appears very stable; unlikely she had a DVT to PE resulting in R heart strain as the etiology of her volume overload -on exam the L leg edema is much more impressive than the R -somewhat concerning for possible DVT vs asymmetric volume overload -unclear etiology for her volume overload otherwise; no hx of heart disease, hepatic disease or renal disease  Plan: -chest x-ray -EKG -Echo -Left LE doppler -CBC -CMP -HIV -consider diuresis pending labs  HTN: -bp stable  Plan: -continue home hctz  MS: -pt reports she is supposed to get an infusion for MS tomorrow -will discuss with pharmacy in AM, unlikely she will be able to receive it inpatient  Plan: -continue oxybutynin and topiramate  GERD: -continue pantoprazole  Active Problems:   Volume overload   Dispo: Admit patient to Inpatient with expected length of stay greater than 2 midnights.  Signed: Al Decant, MD 07/09/2019, 7:06 PM  Pager: [phone]

## 2019-07-10 ENCOUNTER — Inpatient Hospital Stay (HOSPITAL_COMMUNITY): Payer: 59

## 2019-07-10 ENCOUNTER — Telehealth: Payer: Self-pay | Admitting: Neurology

## 2019-07-10 ENCOUNTER — Other Ambulatory Visit: Payer: Self-pay | Admitting: Physician Assistant

## 2019-07-10 ENCOUNTER — Telehealth (HOSPITAL_COMMUNITY): Payer: Self-pay | Admitting: General Practice

## 2019-07-10 ENCOUNTER — Ambulatory Visit (HOSPITAL_COMMUNITY): Payer: Medicaid Other

## 2019-07-10 ENCOUNTER — Encounter (HOSPITAL_COMMUNITY): Payer: Self-pay | Admitting: Internal Medicine

## 2019-07-10 DIAGNOSIS — R0602 Shortness of breath: Secondary | ICD-10-CM

## 2019-07-10 DIAGNOSIS — E8779 Other fluid overload: Secondary | ICD-10-CM

## 2019-07-10 DIAGNOSIS — R29818 Other symptoms and signs involving the nervous system: Secondary | ICD-10-CM

## 2019-07-10 DIAGNOSIS — R609 Edema, unspecified: Secondary | ICD-10-CM

## 2019-07-10 DIAGNOSIS — I272 Pulmonary hypertension, unspecified: Secondary | ICD-10-CM

## 2019-07-10 DIAGNOSIS — R0600 Dyspnea, unspecified: Secondary | ICD-10-CM

## 2019-07-10 LAB — BASIC METABOLIC PANEL
Anion gap: 6 (ref 5–15)
BUN: 17 mg/dL (ref 6–20)
CO2: 23 mmol/L (ref 22–32)
Calcium: 8.6 mg/dL — ABNORMAL LOW (ref 8.9–10.3)
Chloride: 112 mmol/L — ABNORMAL HIGH (ref 98–111)
Creatinine, Ser: 1.13 mg/dL — ABNORMAL HIGH (ref 0.44–1.00)
GFR calc Af Amer: >60 mL/min (ref >60)
GFR calc non Af Amer: 54 mL/min — ABNORMAL LOW (ref >60)
Glucose, Bld: 103 mg/dL — ABNORMAL HIGH (ref 70–99)
Potassium: 3.4 mmol/L — ABNORMAL LOW (ref 3.5–5.1)
Sodium: 141 mmol/L (ref 135–145)

## 2019-07-10 LAB — ECHOCARDIOGRAM COMPLETE
Height: 63 in
Weight: 3142.4 oz

## 2019-07-10 LAB — D-DIMER, QUANTITATIVE: D-Dimer, Quant: <0.27 ug/mL-FEU (ref 0.00–0.50)

## 2019-07-10 MED ORDER — OXYBUTYNIN CHLORIDE 5 MG PO TABS: ORAL_TABLET | ORAL | 5 refills | Status: DC

## 2019-07-10 MED ORDER — POTASSIUM CHLORIDE CRYS ER 20 MEQ PO TBCR
40.0000 meq | EXTENDED_RELEASE_TABLET | Freq: Once | ORAL | Status: AC
  Administered 2019-07-10: 40 meq via ORAL
  Filled 2019-07-10: qty 2

## 2019-07-10 NOTE — Discharge Instructions (Signed)
You were admitted for shortness of breath and lower extremity swelling. We got a chest x-ray which was completely normal. We got labwork which was completed normal. Lab worked ruled out a heart failure exacerbation and a blood clot. We got a lower extremity ultrasound which also ruled out a blood clot. We got an ultrasound of your heart which was normal and unchanged from your last one. Your shortness of breath is a little better and your lower extremity swelling is better. While we are not exactly sure why you are short of breath, we do not believe you have an urgent or emergent condition requiring further hospitalization. You should follow up with your primary care doctor closely to further work up your shortness of breath. It was a pleasure taking care of you.

## 2019-07-10 NOTE — Progress Notes (Addendum)
Subjective: Tamara [name] is seen at bedside today. She just had her Echo done. She says her SOB is slightly better and that her LLE edema is much improved. We discussed the findings so far and our plan and she was amenable.  Consults: na  Objective:  Vital signs in last 24 hours: Vitals:   07/10/19 0002 07/10/19 0332 07/10/19 0541 07/10/19 0800  BP: (!) 92/50 (!) 92/56  (!) 107/48  Pulse:  (!) 59  (!) 58  Resp: 20   20  Temp:  (!) 97.5 F (36.4 C)  98.3 F (36.8 C)  TempSrc:  Oral  Oral  SpO2:  100%  98%  Weight:   89.1 kg    Physical Exam Vitals and nursing note reviewed.  Constitutional:      General: She is not in acute distress.    Appearance: Normal appearance. She is not ill-appearing.  HENT:     Head: Normocephalic and atraumatic.  Cardiovascular:     Rate and Rhythm: Normal rate and regular rhythm.     Heart sounds: No murmur heard.      Comments: Bilateral LE edema, trace pitting edema Pulmonary:     Effort: Pulmonary effort is normal. No respiratory distress.     Breath sounds: Normal breath sounds.  Abdominal:     General: Abdomen is flat. Bowel sounds are normal. There is no distension.     Tenderness: There is no abdominal tenderness.  Musculoskeletal:     Comments: LE swelling, most pronounced in the ankles, L much greater than R  Neurological:     Mental Status: She is alert.    I/Os:  Intake/Output Summary (Last 24 hours) at 07/10/2019 0800 Last data filed at 07/10/2019 0544 Gross per 24 hour  Intake 0 ml  Output 200 ml  Net -200 ml   Telemetry:  Labs: Results for orders placed or performed during the hospital encounter of 07/09/19 (from the past 24 hour(s))  SARS Coronavirus 2 by RT PCR (hospital order, performed in [place] hospital lab) Nasopharyngeal Nasopharyngeal Swab     Status: None   Collection Time: 07/09/19  6:48 PM   Specimen: Nasopharyngeal Swab  Result Value Ref Range   SARS Coronavirus 2 NEGATIVE NEGATIVE  HIV Antibody  (routine testing w rflx)     Status: None   Collection Time: 07/09/19  7:56 PM  Result Value Ref Range   HIV Screen 4th Generation wRfx Non Reactive Non Reactive  CBC     Status: Abnormal   Collection Time: 07/09/19  7:56 PM  Result Value Ref Range   WBC 6.9 4.0 - 10.5 K/uL   RBC 3.89 3.87 - 5.11 MIL/uL   Hemoglobin 10.8 (L) 12.0 - 15.0 g/dL   HCT 34.6 (L) 36 - 46 %   MCV 88.9 80.0 - 100.0 fL   MCH 27.8 26.0 - 34.0 pg   MCHC 31.2 30.0 - 36.0 g/dL   RDW 14.8 11.5 - 15.5 %   Platelets 176 150 - 400 K/uL   nRBC 0.6 (H) 0.0 - 0.2 %  Comprehensive metabolic panel     Status: Abnormal   Collection Time: 07/09/19  7:56 PM  Result Value Ref Range   Sodium 143 135 - 145 mmol/L   Potassium 3.7 3.5 - 5.1 mmol/L   Chloride 112 (H) 98 - 111 mmol/L   CO2 24 22 - 32 mmol/L   Glucose, Bld 91 70 - 99 mg/dL   BUN 15 6 - 20 mg/dL  Creatinine, Ser 1.11 (H) 0.44 - 1.00 mg/dL   Calcium 8.9 8.9 - 10.3 mg/dL   Total Protein 6.2 (L) 6.5 - 8.1 g/dL   Albumin 3.8 3.5 - 5.0 g/dL   AST 16 15 - 41 U/L   ALT 15 0 - 44 U/L   Alkaline Phosphatase 59 38 - 126 U/L   Total Bilirubin 0.5 0.3 - 1.2 mg/dL   GFR calc non Af Amer 55 (L) >60 mL/min   GFR calc Af Amer >60 >60 mL/min   Anion gap 7 5 - 15  Magnesium     Status: None   Collection Time: 07/09/19  7:56 PM  Result Value Ref Range   Magnesium 2.4 1.7 - 2.4 mg/dL  Brain natriuretic peptide     Status: None   Collection Time: 07/09/19  7:56 PM  Result Value Ref Range   B Natriuretic Peptide 24.1 0.0 - 100.0 pg/mL  Basic metabolic panel     Status: Abnormal   Collection Time: 07/10/19  5:19 AM  Result Value Ref Range   Sodium 141 135 - 145 mmol/L   Potassium 3.4 (L) 3.5 - 5.1 mmol/L   Chloride 112 (H) 98 - 111 mmol/L   CO2 23 22 - 32 mmol/L   Glucose, Bld 103 (H) 70 - 99 mg/dL   BUN 17 6 - 20 mg/dL   Creatinine, Ser 1.13 (H) 0.44 - 1.00 mg/dL   Calcium 8.6 (L) 8.9 - 10.3 mg/dL   GFR calc non Af Amer 54 (L) >60 mL/min   GFR calc Af Amer >60 >60  mL/min   Anion gap 6 5 - 15   Imaging: DG Chest 2 View  Result Date: 07/09/2019 CLINICAL DATA:  Shortness of breath. EXAM: CHEST - 2 VIEW COMPARISON:  July 09, 2016 FINDINGS: There is no evidence of acute infiltrate, pleural effusion or pneumothorax. The heart size and mediastinal contours are within normal limits. The visualized skeletal structures are unremarkable. IMPRESSION: No active cardiopulmonary disease. Electronically Signed   By: Virgina Norfolk M.D.   On: 07/09/2019 20:16   Assessment/Plan:  Tamara [name] is a 59 yo F w/ a PMHx of MS, HTN, GERD and IBS presenting w/ 2 weeks of LE edema and shortness of breath.  Plan by Problem: Volume Overload: Shortness of Breath: -pt presenting with 2 weeks of LE edema, L>R, orthopnea and paroxysmal nocturnal dyspnea  -she originally presented to the outpatient clinic and due to concern for new heart failure she was admitted for further work up; of note she did have Echo in 2019 w/ LVEF of 65-70% and grade 2 diastolic dysfunction -story unusual in that her left leg swelled prior to her right; she also endorses some left arm swelling -pt denies recent travel or sedentary behavior -she denies personal and family hx of blood clots -she does report left sided pleuritic pain as well, however, this preceded her swelling -unclear etiology for her volume overload otherwise; no hx of hepatic disease or renal disease; 2019 echo w/ grade 2 diastolic dysfunction -however, chest x-ray without signs of volume overload, BNP normal making heart failure appear less likely -no abnormal LFTS, Creatinine only slightly elevated but appears at baseline, normal albumin so hepatic and renal causes unlikely -will further evaluate for possible blood clot with d-dimer  Plan: -du Echo -fu left LE doppler -fu D-Dimer  HTN: -bp stable  Plan: -continue home hctz  MS: -supposed to have natalizumab infusion today, told patient that couldn't be done inpatient and  she reports it has been rescheduled for the 15th -she reports some neck spasms since she hasn't been able to lie flat to sleep  Plan: -continue oxybutynin and topiramate -flexeril for spasms  GERD: -continue pantoprazole  Dispo: Anticipated discharge pending clinical course.  Al Decant, MD 07/10/2019, 8:00 AM Pager: [phone]

## 2019-07-10 NOTE — Telephone Encounter (Signed)
[facility] uploaded notes

## 2019-07-10 NOTE — Progress Notes (Signed)
Lower ext venous study completed. Preliminary results are under [place].

## 2019-07-10 NOTE — Telephone Encounter (Signed)
Patient was scheduled for Tysabri infusion today 07/10/19 at 10:30 am. Patient did not come. On the 07/08/19, patient spoke to RN about an IV placed in the patient by the IV team on May 10 th, 2021. Since patient was coming back today, RN was supposed to call in an IV team member for discussion about the IV placed. However, patient did not come today. So RN will contact the IV team to call the patient with this number [phone].

## 2019-07-10 NOTE — Progress Notes (Signed)
  Date: 07/10/2019  Patient name: Tamara [name]  Medical record number: [id number]  Date of birth: [birth year]   I have seen and evaluated Tamara [name] and discussed their care with the Residency Team.  In brief, patient is a 58 year old female with a past medical history of multiple sclerosis who presented to the internal medicine clinic with progressively worsening shortness of breath over the last couple of weeks.  Approximately 2 to 3 weeks ago patient developed new onset left-sided chest pain near her diaphragm which is pleuritic in nature and then had progressive swelling of her left arm and leg which progressed to involve her right leg as well.  Patient also noted progressively worsening shortness of breath with dyspnea on exertion and orthopnea.  No palpitations, no diaphoresis, no headache, no blurry vision, no focal weakness, tingling or numbness, no abdominal pain, no nausea or vomiting, no diarrhea.  Patient states that her lower extremity swelling has improved except for her left foot and ankle and that her upper extremity swelling has resolved as well.  Patient also states that her shortness of breath is improving but she still has orthopnea.  PMHx, Fam Hx, and/or Soc Hx : As per resident admit note  Vitals:   07/10/19 0803 07/10/19 1221  BP: (!) 148/81 (!) 104/53  Pulse: 73 70  Resp: 18 (!) 21  Temp: 98.4 F (36.9 C) 98.3 F (36.8 C)  SpO2: 99% 100%   General: Awake, alert, oriented x3, NAD CVS: Regular rate and rhythm, normal heart sounds Lungs: CTA bilaterally Abdomen: Soft, nontender, nondistended, normoactive bowel sounds Extremities: Trace bilateral lower extremity pitting edema more pronounced in the left ankle and foot Psych: Normal mood and affect HEENT: Normocephalic, atraumatic Skin: Warm and dry  Assessment and Plan: I have seen and evaluated the patient as outlined above. I agree with the formulated Assessment and Plan as detailed in the residents' note, with the  following changes:   1.  Shortness of breath possibly secondary to pulmonary hypertension: -Patient presented to the ED with progressive swelling of her bilateral lower extremities worse in the left as well as progressive shortness of breath over the last couple weeks and is seen in internal medicine clinic and referred to the ED for possible heart failure exacerbation.  Here, patient's BNP was within normal limits making heart failure unlikely.  There was concern for possible DVT and PE but D-dimer is within normal limits and left lower extremity Doppler shows no evidence of a clot.  Patient is symptomatically improving but still has some persistent shortness of breath.  2D echo done here showed moderately elevated pulmonary arterial pressures and elevated right-sided heart pressures consistent with possible pulmonary hypertension. -We will obtain cardiology consult given elevated pulmonary arterial pressures and concern for possible pulmonary hypertension.  Patient may need a right heart cath for further evaluation. -Patient's lower extremity swelling appears to be improving and has no evidence of DVT on Doppler.  We will continue to monitor for now.  No need for diuresis at this point. -No further work-up at this time.  Further disposition depends on whether cardiology would like to do an inpatient versus outpatient evaluation for her elevated pulmonary pressures.  Tamara Contes, MD 6/10/20211:43 PM

## 2019-07-10 NOTE — Discharge Summary (Addendum)
Name: Tamara [name] MRN: [id number] DOB: [birth year] 59 y.o. PCP: Lucious Groves, DO  Date of Admission: 07/09/2019  5:19 PM Date of Discharge:  07/11/19 Attending Physician: Aldine Contes, MD  Discharge Diagnosis: 1. Lower extremity edema 2. Shortness of Breath 3. Hypertension 4. Multiple Sclerosis  Discharge Medications: Allergies as of 07/11/2019      Reactions   Codeine Other (See Comments)   Delusions   Dilaudid [hydromorphone Hcl] Swelling, Other (See Comments)   Tongue swells   Other Swelling, Other (See Comments)   Unnamed gel or antiseptic solution- Was applied to IV site with a needle- Turned the skin "black and blue" that remained (caused burning and phlebitis, also)   Paxil [paroxetine Hcl] Other (See Comments)   Hallucinations and heavy periods   Tegretol [carbamazepine] Itching   Trileptal [oxcarbazepine] Swelling   Adhesive [tape] Hives, Itching, Rash, Other (See Comments)   PAPER TAPE   Augmentin [amoxicillin-pot Clavulanate] Rash   Gabapentin Rash, Other (See Comments)   Hallucinations and depression, also      Medication List    STOP taking these medications   hydrochlorothiazide 25 MG tablet Commonly known as: HYDRODIURIL     TAKE these medications   acetaminophen 650 MG CR tablet Commonly known as: TYLENOL Take 1,300 mg by mouth in the morning, at noon, and at bedtime.   docusate sodium 100 MG capsule Commonly known as: COLACE Take 1 capsule (100 mg total) by mouth daily. What changed:   when to take this  reasons to take this   Fish Oil 1000 MG Caps Take 1,000-2,000 mg by mouth See admin instructions. Take 2,000 mg by mouth in the morning and 1,000 mg at bedtime   fluticasone 50 MCG/ACT nasal spray Commonly known as: FLONASE Place 2 sprays into both nostrils daily. What changed:   when to take this  reasons to take this   loratadine 10 MG tablet Commonly known as: CLARITIN Take 10 mg by mouth daily as needed for allergies or  rhinitis.   natalizumab 300 MG/15ML injection Commonly known as: TYSABRI Inject 300 mg into the vein every 30 (thirty) days.   ONE-A-DAY WOMENS PO Take 1 tablet by mouth daily.   Hair/Skin/Nails Caps Take 1 capsule by mouth in the morning, at noon, and at bedtime.   oxybutynin 5 MG tablet Commonly known as: DITROPAN Take one or two a day po   pantoprazole 40 MG tablet Commonly known as: PROTONIX Take 1 tablet (40 mg total) by mouth daily.   phentermine 37.5 MG capsule Take 1 capsule by mouth once daily in the morning What changed: See the new instructions.   topiramate 50 MG tablet Commonly known as: Topamax Take 1 tablet (50 mg total) by mouth 3 (three) times daily.   traMADol 50 MG tablet Commonly known as: ULTRAM TAKE 1 TABLET BY MOUTH TWICE DAILY AS NEEDED FOR SEVERE PAIN What changed: See the new instructions.   Vitamin D-3 25 MCG (1000 UT) Caps Take 2,000 Units by mouth daily.       Disposition and follow-up:   [name] was discharged from [facility] in Good condition.  At the hospital follow up visit please address:  1.  Please ensure cardiology follow up. Please ensure follow up for sleep study. Please ensure patient's BP stable without HCTZ. Please ensure patient wearing compression stockings.  2.  Labs / imaging needed at time of follow-up: na  3.  Pending labs/ test needing follow-up: na  Follow-up  Appointments:  Follow-up Information    Lucious Groves, DO. Schedule an appointment as soon as possible for a visit in 1 week(s).   Specialty: Internal Medicine Contact information: [place] [address] [place] Follow up.   Specialty: Cardiology Why: The cardiology office will arrange a sleep study for you to evaluate for sleep apnea. Contact information: [address] [place] [phone]       Geralynn Rile, MD  Follow up.   Specialties: Internal Medicine, Cardiology, Radiology Why: [facility] - a follow-up visit has been arranged for you with Dr. Audie Box on Thursday July 31, 2019 at 10:20 AM (Arrive by 10:05 AM). Contact information: [place] [address] [place] Hospital Course by problem list: 1. Lower extremity edema 2. Shortness of Breath -pt presenting with 2 weeks of LE edema, L>R, orthopnea and paroxysmal nocturnal dyspnea  -she originally presented to the outpatient clinic and due to concern for new heart failure she was admitted for further work up; of note she did have Echo in 2019 w/ LVEF of 65-70% and grade 2 diastolic dysfunction -story unusual in that her left leg swelled prior to her right; she also endorses some left arm swelling -pt denied recent travel or sedentary behavior -she denied personal and family hx of blood clots -unclear etiology for her volume overload otherwise; no hx of hepatic disease or renal disease; 2019 echo w/ grade 2 diastolic dysfunction -however, chest x-ray without signs of volume overload, BNP normal making heart failure appear less likely -no abnormal LFTS, Creatinine only slightly elevated but appears at baseline, normal albumin so hepatic and renal causes unlikely -D-dimer normal, LE dopplers without evidence of DVT -Echo w/ EF of 65-70% w/ normal diastolic function, trivial TR, mild pulmonary hypertension -cardiology consulted and believes patient's edema due to venous insufficiency and recommends compression stockings; for mild pulmonary hypertension he does not recommend right heart cath but does recommend sleep study to screen for sleep apnea  3. Hypertension -bp stable without hctz while admitted, dc'ing hctz on discharge  4. Multiple Sclerosis -supposed to have natalizumab infusion during admission -contacted neurologist and infusion center nurse to have infusion rescheduled -continued on  oxybutynin and topiramate throughout admission  Discharge Vitals:   BP 122/72 (BP Location: Left Arm)    Pulse 60    Temp 97.9 F (36.6 C) (Oral)    Resp 18    Wt 88.5 kg    SpO2 100%    BMI 34.54 kg/m   Pertinent Labs, Studies, and Procedures:  Results for orders placed or performed during the hospital encounter of 07/09/19 (from the past 168 hour(s))  SARS Coronavirus 2 by RT PCR (hospital order, performed in [place] hospital lab) Nasopharyngeal Nasopharyngeal Swab   Collection Time: 07/09/19  6:48 PM   Specimen: Nasopharyngeal Swab  Result Value Ref Range   SARS Coronavirus 2 NEGATIVE NEGATIVE  HIV Antibody (routine testing w rflx)   Collection Time: 07/09/19  7:56 PM  Result Value Ref Range   HIV Screen 4th Generation wRfx Non Reactive Non Reactive  CBC   Collection Time: 07/09/19  7:56 PM  Result Value Ref Range   WBC 6.9 4.0 - 10.5 K/uL   RBC 3.89 3.87 - 5.11 MIL/uL   Hemoglobin 10.8 (L) 12.0 - 15.0 g/dL  HCT 34.6 (L) 36 - 46 %   MCV 88.9 80.0 - 100.0 fL   MCH 27.8 26.0 - 34.0 pg   MCHC 31.2 30.0 - 36.0 g/dL   RDW 14.8 11.5 - 15.5 %   Platelets 176 150 - 400 K/uL   nRBC 0.6 (H) 0.0 - 0.2 %  Comprehensive metabolic panel   Collection Time: 07/09/19  7:56 PM  Result Value Ref Range   Sodium 143 135 - 145 mmol/L   Potassium 3.7 3.5 - 5.1 mmol/L   Chloride 112 (H) 98 - 111 mmol/L   CO2 24 22 - 32 mmol/L   Glucose, Bld 91 70 - 99 mg/dL   BUN 15 6 - 20 mg/dL   Creatinine, Ser 1.11 (H) 0.44 - 1.00 mg/dL   Calcium 8.9 8.9 - 10.3 mg/dL   Total Protein 6.2 (L) 6.5 - 8.1 g/dL   Albumin 3.8 3.5 - 5.0 g/dL   AST 16 15 - 41 U/L   ALT 15 0 - 44 U/L   Alkaline Phosphatase 59 38 - 126 U/L   Total Bilirubin 0.5 0.3 - 1.2 mg/dL   GFR calc non Af Amer 55 (L) >60 mL/min   GFR calc Af Amer >60 >60 mL/min   Anion gap 7 5 - 15  Magnesium   Collection Time: 07/09/19  7:56 PM  Result Value Ref Range   Magnesium 2.4 1.7 - 2.4 mg/dL  Brain natriuretic peptide   Collection Time:  07/09/19  7:56 PM  Result Value Ref Range   B Natriuretic Peptide 24.1 0.0 - 100.0 pg/mL  Basic metabolic panel   Collection Time: 07/10/19  5:19 AM  Result Value Ref Range   Sodium 141 135 - 145 mmol/L   Potassium 3.4 (L) 3.5 - 5.1 mmol/L   Chloride 112 (H) 98 - 111 mmol/L   CO2 23 22 - 32 mmol/L   Glucose, Bld 103 (H) 70 - 99 mg/dL   BUN 17 6 - 20 mg/dL   Creatinine, Ser 1.13 (H) 0.44 - 1.00 mg/dL   Calcium 8.6 (L) 8.9 - 10.3 mg/dL   GFR calc non Af Amer 54 (L) >60 mL/min   GFR calc Af Amer >60 >60 mL/min   Anion gap 6 5 - 15  ECHOCARDIOGRAM COMPLETE   Collection Time: 07/10/19  9:30 AM  Result Value Ref Range   Weight 3,142.4 oz   Height 63 in   BP 148/81 mmHg  D-dimer, quantitative (not at [facility])   Collection Time: 07/10/19  9:51 AM  Result Value Ref Range   D-Dimer, Quant <0.27 0.00 - 0.50 ug/mL-FEU   DG Chest 2 View  Result Date: 07/09/2019 CLINICAL DATA:  Shortness of breath. EXAM: CHEST - 2 VIEW COMPARISON:  July 09, 2016 FINDINGS: There is no evidence of acute infiltrate, pleural effusion or pneumothorax. The heart size and mediastinal contours are within normal limits. The visualized skeletal structures are unremarkable. IMPRESSION: No active cardiopulmonary disease. Electronically Signed   By: Virgina Norfolk M.D.   On: 07/09/2019 20:16   ECHOCARDIOGRAM COMPLETE  Result Date: 07/10/2019    ECHOCARDIOGRAM REPORT   Patient Name:   Felishia Tung Date of Exam: 07/10/2019 Medical Rec #:  [id number]   Height:       63.0 in Accession #:    [id number]  Weight:       196.4 lb Date of Birth:  August 16, 1960   BSA:          1.919 m Patient Age:  58 years    BP:           148/81 mmHg Patient Gender: F           HR:           61 bpm. Exam Location:  Inpatient Procedure: 2D Echo Indications:    volume overload  History:        Patient has prior history of Echocardiogram examinations, most                 recent 08/27/2017.  Sonographer:    Johny Chess Referring Phys: [id number] NISCHAL  NARENDRA IMPRESSIONS  1. Left ventricular ejection fraction, by estimation, is 65 to 70%. The left ventricle has normal function. The left ventricle has no regional wall motion abnormalities. Left ventricular diastolic parameters were normal.  2. Right ventricular systolic function is normal. The right ventricular size is normal. There is moderately elevated pulmonary artery systolic pressure.  3. The mitral valve is normal in structure. Trivial mitral valve regurgitation. No evidence of mitral stenosis.  4. The aortic valve is tricuspid. Aortic valve regurgitation is not visualized. No aortic stenosis is present.  5. The inferior vena cava is dilated in size with <50% respiratory variability, suggesting right atrial pressure of 15 mmHg. FINDINGS  Left Ventricle: Left ventricular ejection fraction, by estimation, is 65 to 70%. The left ventricle has normal function. The left ventricle has no regional wall motion abnormalities. The left ventricular internal cavity size was normal in size. There is  no left ventricular hypertrophy. Left ventricular diastolic parameters were normal. Indeterminate filling pressures. Right Ventricle: The right ventricular size is normal. No increase in right ventricular wall thickness. Right ventricular systolic function is normal. There is moderately elevated pulmonary artery systolic pressure. The tricuspid regurgitant velocity is 2.88 m/s, and with an assumed right atrial pressure of 15 mmHg, the estimated right ventricular systolic pressure is 46.5 mmHg. Left Atrium: Left atrial size was normal in size. Right Atrium: Right atrial size was normal in size. Pericardium: There is no evidence of pericardial effusion. Mitral Valve: The mitral valve is normal in structure. Normal mobility of the mitral valve leaflets. Trivial mitral valve regurgitation. No evidence of mitral valve stenosis. Tricuspid Valve: The tricuspid valve is normal in structure. Tricuspid valve regurgitation is  trivial. No evidence of tricuspid stenosis. Aortic Valve: The aortic valve is tricuspid. Aortic valve regurgitation is not visualized. No aortic stenosis is present. Pulmonic Valve: The pulmonic valve was normal in structure. Pulmonic valve regurgitation is not visualized. No evidence of pulmonic stenosis. Aorta: The aortic root is normal in size and structure. Venous: The inferior vena cava is dilated in size with less than 50% respiratory variability, suggesting right atrial pressure of 15 mmHg. IAS/Shunts: No atrial level shunt detected by color flow Doppler.  LEFT VENTRICLE PLAX 2D LVIDd:         4.00 cm  Diastology LVIDs:         2.20 cm  LV e' lateral:   11.60 cm/s LV PW:         1.00 cm  LV E/e' lateral: 9.3 LV IVS:        0.90 cm  LV e' medial:    8.38 cm/s LVOT diam:     2.00 cm  LV E/e' medial:  12.9 LV SV:         87 LV SV Index:   46 LVOT Area:     3.14 cm  RIGHT VENTRICLE  IVC RV S prime:     13.50 cm/s  IVC diam: 2.50 cm TAPSE (M-mode): 2.7 cm LEFT ATRIUM             Index       RIGHT ATRIUM           Index LA diam:        3.10 cm 1.62 cm/m  RA Area:     12.50 cm LA Vol (A2C):   51.1 ml 26.63 ml/m RA Volume:   29.10 ml  15.17 ml/m LA Vol (A4C):   29.7 ml 15.48 ml/m LA Biplane Vol: 38.9 ml 20.27 ml/m  AORTIC VALVE LVOT Vmax:   136.00 cm/s LVOT Vmean:  80.000 cm/s LVOT VTI:    0.278 m  AORTA Ao Root diam: 2.80 cm MITRAL VALVE                TRICUSPID VALVE MV Area (PHT): 3.42 cm     TR Peak grad:   33.2 mmHg MV Decel Time: 222 msec     TR Vmax:        288.00 cm/s MV E velocity: 108.00 cm/s MV A velocity: 99.80 cm/s   SHUNTS MV E/A ratio:  1.08         Systemic VTI:  0.28 m                             Systemic Diam: 2.00 cm Skeet Latch MD Electronically signed by Skeet Latch MD Signature Date/Time: 07/10/2019/12:22:42 PM    Final    VAS [place] LOWER EXTREMITY VENOUS (DVT)  Result Date: 07/10/2019  Lower Venous DVTStudy Indications: Edema. Other Indications: Bilateral lower  edema, L>R. Comparison Study: 06/20/16 - Negative LEV Performing Technologist: Darlin Coco  Examination Guidelines: A complete evaluation includes B-mode imaging, spectral Doppler, color Doppler, and power Doppler as needed of all accessible portions of each vessel. Bilateral testing is considered an integral part of a complete examination. Limited examinations for reoccurring indications may be performed as noted. The reflux portion of the exam is performed with the patient in reverse Trendelenburg.  +---------+---------------+---------+-----------+----------+--------------+  RIGHT     Compressibility Phasicity Spontaneity Properties Thrombus Aging  +---------+---------------+---------+-----------+----------+--------------+  CFV       Full            Yes       Yes                                    +---------+---------------+---------+-----------+----------+--------------+  SFJ       Full                                                             +---------+---------------+---------+-----------+----------+--------------+  FV Prox   Full                                                             +---------+---------------+---------+-----------+----------+--------------+  FV Mid    Full                                                             +---------+---------------+---------+-----------+----------+--------------+  FV Distal Full                                                             +---------+---------------+---------+-----------+----------+--------------+  PFV       Full                                                             +---------+---------------+---------+-----------+----------+--------------+  POP       Full            Yes       Yes                                    +---------+---------------+---------+-----------+----------+--------------+  PTV       Full                                                              +---------+---------------+---------+-----------+----------+--------------+  PERO      Full                                                             +---------+---------------+---------+-----------+----------+--------------+   +---------+---------------+---------+-----------+----------+--------------+  LEFT      Compressibility Phasicity Spontaneity Properties Thrombus Aging  +---------+---------------+---------+-----------+----------+--------------+  CFV       Full            Yes       Yes                                    +---------+---------------+---------+-----------+----------+--------------+  SFJ       Full                                                             +---------+---------------+---------+-----------+----------+--------------+  FV Prox   Full                                                             +---------+---------------+---------+-----------+----------+--------------+  FV Mid    Full                                                             +---------+---------------+---------+-----------+----------+--------------+  FV Distal Full                                                             +---------+---------------+---------+-----------+----------+--------------+  PFV       Full                                                             +---------+---------------+---------+-----------+----------+--------------+  POP       Full            Yes       Yes                                    +---------+---------------+---------+-----------+----------+--------------+  PTV       Full                                                             +---------+---------------+---------+-----------+----------+--------------+  PERO      Full                                                             +---------+---------------+---------+-----------+----------+--------------+     Summary: BILATERAL: - No evidence of deep vein thrombosis seen in the lower extremities, bilaterally. -No evidence of  popliteal cyst, bilaterally.   *See table(s) above for measurements and observations. Electronically signed by Servando Snare MD on 07/10/2019 at 5:00:40 PM.    Final     Discharge Instructions: Discharge Instructions    Activity as tolerated - No restrictions   Complete by: As directed    Activity as tolerated - No restrictions   Complete by: As directed    Diet general   Complete by: As directed       Signed: Al Decant, MD 07/11/2019, 8:50 AM   Pager: [phone]

## 2019-07-10 NOTE — Consult Note (Addendum)
Cardiology Consultation:   Patient ID: Tamara [name] MRN: [id number]; DOB: 11-01-1960  Admit date: 07/09/2019 Date of Consult: 07/10/2019  Primary Care Provider: Lucious Groves, DO [facility] Cardiologist: Evalina Field, MD (new) [facility] Electrophysiologist:  None   Patient Profile:   Tamara [name] is a 59 y.o. female with a hx of multiple sclerosis on Tysabri, GERD, IBS, sciatica, anemia, prior bowel obstruction, obesity, CKD stage II by labs, RBBB who is being seen today for the evaluation of lower extremity edema and pulmonary HTN at the request of Dr. Lysbeth Galas.  History of Present Illness:   Tamara [name] has no prior cardiac history except EKG did show prior RBBB. She had an echo in 2019 that showed normal LVF and grade 2 diastolic dysfunction.   She was admitted with reports of lower extremity edema. The chronicity of this was somewhat challenging to follow as the patient reports concerns regarding her infusion site that precede this hospitalization by many weeks, which she believes precipitated her issues. She cites that she was very focused on symptoms from 5/10-5/22/21 as this was the timeframe she was tracking her infusion site problem. She reports that on 06/09/19 she went for her Tysabri infusion in her left arm and they used a type of gel that she thought got into her arm, because for several weeks after she had swelling, bruising and redness of this site for the next several weeks. During this timeframe she also noticed onset of focal left axillary pleuritic chest discomfort upon inspiration but did not occur every time. She has a hard time quantifying this, perhaps 5-6 times over the last few weeks. She also noticed what sounds like orthopnea and DOE on some occasions. She began taking Tylenol PRN, sleeping sitting up, and limiting her activity so as not to provoke any further symptoms. With these measures she felt better. However, in the past 1 week she then noticed worsening  aching of her legs as well as lower extremity edema that she states made it difficult to ambulate around her house. She reports weight gain noted on VS - per review of weights in Epic, weight was 197 in 12/2018, 200 yesterday, 196 today, and previously in the 220s before then.  She presented to the outpatient IM clinic yesterday for evaluation of the worsening lower extremity edema. She also had had another episode of that left sided pleuritic chest discomfort. Per our discussion today, this was not nearly as noticeable as it was weeks prior. She indicates it's been difficult to tell whether she has still had the SOB the last week as she had been limiting any activity. Per clinic note, she was volume overloaded on exam with 2+ pitting edema of her legs, reported L>R in H/P. She was admitted for further workup. Labs show normal BNP, negative d-dimer, mild anemia with Hgb 10.8 (similar to prior),Cr 1.13 (similar to prior), Covid negative, potassium 3.7->3.4 (written for repletion), Mg 2.4, total protein 6.4. 2D echo today showed normal LVEF 38-75%, normal diastolic parameters, normal RV function with moderately elevated PASP, trivial MR, dilated IVC. SBPs have been labile as well as soft at times to 88/65 last night, up to a peak of 148/81. O2 sat WNL. LE venous duplex was negative for SVT. No tobacco or pulm hx. Edema appears to have completely resolved on examination. She is able to lie flat without dyspnea. She has not had any PND. She did not require any diuresis this admission. She is on HCTZ as outpatient. She  does snore and reports generalized fatigue.  Past Medical History:  Diagnosis Date  . Anemia   . Bowel obstruction ([place])   . CKD (chronic kidney disease), stage II    based on labs  . GERD (gastroesophageal reflux disease)   . IBS (irritable bowel syndrome)    at age of 36  . Multiple sclerosis ([place])   . RBBB   . Sciatica 2009  . Vision abnormalities     Past Surgical History:   Procedure Laterality Date  . ABDOMINAL HYSTERECTOMY  2008   cervix and right ovary still intact  . KNEE ARTHROSCOPY  2010 and 2011   Left knee, x2     Home Medications:  Prior to Admission medications   Medication Sig Start Date End Date Taking? Authorizing Provider  ALPRAZolam Duanne Moron) 1 MG tablet Take 1-2 tablets thirty minutes prior to MRI.  May take one additional tablet before entering scanner, if needed.  MUST HAVE DRIVER. 01/01/18   Sater, Nanine Means, MD  cetirizine (ZYRTEC ALLERGY) 10 MG tablet Take 1 tablet (10 mg total) by mouth daily. Patient taking differently: Take 10 mg by mouth daily as needed for allergies.  04/12/16   Dellia Nims, MD  diclofenac sodium (VOLTAREN) 1 % GEL Apply 2 g topically 3 (three) times daily. 06/28/16   Angiulli, Lavon Paganini, PA-C  docusate sodium (COLACE) 100 MG capsule Take 1 capsule (100 mg total) by mouth daily. 06/28/16   Angiulli, Lavon Paganini, PA-C  fluticasone (FLONASE) 50 MCG/ACT nasal spray Place 2 sprays into both nostrils daily. 11/06/16   [name], MD  hydrochlorothiazide (HYDRODIURIL) 25 MG tablet Take 1 tablet by mouth once daily 01/07/19   Sater, Nanine Means, MD  lidocaine (XYLOCAINE) 5 % ointment Apply 1 application topically as needed. Apply to hands once or twice a day 01/14/19   Sater, Nanine Means, MD  natalizumab (TYSABRI) 300 MG/15ML injection Inject 300 mg into the vein every 30 (thirty) days.    [provider]  Omega-3 Fatty Acids (FISH OIL) 1000 MG CAPS Take 1,000-2,000 mg by mouth See admin instructions. Take 2 capsules in the morning and take 1 capsule at bedtime     [provider]  oxybutynin (DITROPAN) 5 MG tablet Take one or two a day po 02/13/17   Sater, Nanine Means, MD  pantoprazole (PROTONIX) 40 MG tablet Take 1 tablet (40 mg total) by mouth daily. 11/13/18   Axel Filler, MD  phentermine 37.5 MG capsule Take 1 capsule by mouth once daily in the morning 06/14/19   Sater, Nanine Means, MD  topiramate (TOPAMAX) 50  MG tablet Take 1 tablet (50 mg total) by mouth 3 (three) times daily. 08/29/18   Sater, Nanine Means, MD  traMADol (ULTRAM) 50 MG tablet TAKE 1 TABLET BY MOUTH TWICE DAILY AS NEEDED FOR SEVERE PAIN 02/17/19   Sater, Nanine Means, MD    Inpatient Medications: Scheduled Meds: . enoxaparin (LOVENOX) injection  40 mg Subcutaneous Q24H  . oxybutynin  5 mg Oral BID  . pantoprazole  40 mg Oral Daily  . ramelteon  8 mg Oral QHS  . sodium chloride flush  3 mL Intravenous Q12H  . topiramate  50 mg Oral TID   Continuous Infusions:  PRN Meds: cyclobenzaprine  Allergies:    Allergies  Allergen Reactions  . Codeine Other (See Comments)    Delusions  . Dilaudid [Hydromorphone Hcl] Swelling    Tongue swells   . Paxil [Paroxetine Hcl] Other (See Comments)  Hallucinations and heavy periods   . Tegretol [Carbamazepine] Itching  . Trileptal [Oxcarbazepine] Swelling  . Adhesive [Tape] Hives, Itching and Rash    PAPER TAPE=   . Augmentin [Amoxicillin-Pot Clavulanate] Rash  . Gabapentin Rash    Pt. sts. Gabapentin caused rash, hallucinations and depression    Social History:   Social History   Socioeconomic History  . Marital status: Married    Spouse name: Not on file  . Number of children: 2  . Years of education: Not on file  . Highest education level: Not on file  Occupational History  . Occupation: Armed forces technical officer  Tobacco Use  . Smoking status: Never Smoker  . Smokeless tobacco: Never Used  Substance and Sexual Activity  . Alcohol use: No    Alcohol/week: 0.0 standard drinks  . Drug use: No  . Sexual activity: Not on file  Other Topics Concern  . Not on file  Social History Narrative  . Not on file   Social Determinants of Health   Financial Resource Strain:   . Difficulty of Paying Living Expenses:   Food Insecurity:   . Worried About Charity fundraiser in the Last Year:   . Arboriculturist in the Last Year:   Transportation Needs:   . Film/video editor  (Medical):   Marland Kitchen Lack of Transportation (Non-Medical):   Physical Activity:   . Days of Exercise per Week:   . Minutes of Exercise per Session:   Stress:   . Feeling of Stress :   Social Connections:   . Frequency of Communication with Friends and Family:   . Frequency of Social Gatherings with Friends and Family:   . Attends Religious Services:   . Active Member of Clubs or Organizations:   . Attends Archivist Meetings:   Marland Kitchen Marital Status:   Intimate Partner Violence:   . Fear of Current or Ex-Partner:   . Emotionally Abused:   Marland Kitchen Physically Abused:   . Sexually Abused:     Family History:   Family History  Problem Relation Age of Onset  . Cancer Father        Gallbladder  . Gallbladder disease Father   . Hypertension Brother   . Gallbladder disease Paternal Grandmother   . Colon cancer Neg Hx   . Colon polyps Neg Hx   . Esophageal cancer Neg Hx   . Kidney disease Neg Hx      ROS:  Please see the history of present illness.  No fevers, chills. All other ROS reviewed and negative.     Physical Exam/Data:   Vitals:   07/10/19 0541 07/10/19 0800 07/10/19 0803 07/10/19 1221  BP:  (!) 107/48 (!) 148/81 (!) 104/53  Pulse:  (!) 58 73 70  Resp:  20 18 (!) 21  Temp:  98.3 F (36.8 C) 98.4 F (36.9 C) 98.3 F (36.8 C)  TempSrc:  Oral Oral Oral  SpO2:  98% 99% 100%  Weight: 89.1 kg       Intake/Output Summary (Last 24 hours) at 07/10/2019 1538 Last data filed at 07/10/2019 1508 Gross per 24 hour  Intake 3 ml  Output 200 ml  Net -197 ml   Last 3 Weights 07/10/2019 07/09/2019 12/04/2018  Weight (lbs) 196 lb 6.4 oz 200 lb 14.4 oz 197 lb  Weight (kg) 89.086 kg 91.128 kg 89.359 kg     Body mass index is 34.79 kg/m.  General: Well developed, well nourished AAF in no  acute distress. Lying at around 10 degrees without dyspnea, speaking comfortably Head: Normocephalic, atraumatic, sclera non-icteric, no xanthomas, nares are without discharge. Neck: Negative for  carotid bruits. JVP not elevated. Lungs: Clear bilaterally to auscultation without wheezes, rales, or rhonchi. Breathing is unlabored. Heart: RRR S1 S2 without murmurs, rubs, or gallops.  Abdomen: Soft, non-tender, non-distended with normoactive bowel sounds. No rebound/guarding. Extremities: No clubbing or cyanosis. No edema. Distal pedal pulses are 2+ and equal bilaterally. Neuro: Alert and oriented X 3. Moves all extremities spontaneously. Psych:  Responds to questions appropriately with a normal affect.   EKG:  The EKG was personally reviewed and demonstrates:  NSR 63bpm with RBBB with nonspecific TW changes. RBBB is known from prior.  Telemetry:  Telemetry was personally reviewed and demonstrates:  NSR  Relevant CV Studies: 2D Echo 07/10/19 1. Left ventricular ejection fraction, by estimation, is 65 to 70%. The  left ventricle has normal function. The left ventricle has no regional  wall motion abnormalities. Left ventricular diastolic parameters were  normal.  2. Right ventricular systolic function is normal. The right ventricular  size is normal. There is moderately elevated pulmonary artery systolic  pressure.  3. The mitral valve is normal in structure. Trivial mitral valve  regurgitation. No evidence of mitral stenosis.  4. The aortic valve is tricuspid. Aortic valve regurgitation is not  visualized. No aortic stenosis is present.  5. The inferior vena cava is dilated in size with <50% respiratory  variability, suggesting right atrial pressure of 15 mmHg.   2D Echo 07/2017 - Left ventricle: The cavity size was normal. Wall thickness was  normal. Systolic function was vigorous. The estimated ejection  fraction was in the range of 65% to 70%. Wall motion was normal;  there were no regional wall motion abnormalities. Features are  consistent with a pseudonormal left ventricular filling pattern,  with concomitant abnormal relaxation and increased filling   pressure (grade 2 diastolic dysfunction).  Impressions:  - Vigorous LV systoilc function; moderate diastolic dysfunction.     Laboratory Data:  High Sensitivity Troponin:  No results for input(s): TROPONINIHS in the last 720 hours.   Chemistry Recent Labs  Lab 07/09/19 1956 07/10/19 0519  NA 143 141  K 3.7 3.4*  CL 112* 112*  CO2 24 23  GLUCOSE 91 103*  BUN 15 17  CREATININE 1.11* 1.13*  CALCIUM 8.9 8.6*  GFRNONAA 55* 54*  GFRAA >60 >60  ANIONGAP 7 6    Recent Labs  Lab 07/09/19 1956  PROT 6.2*  ALBUMIN 3.8  AST 16  ALT 15  ALKPHOS 59  BILITOT 0.5   Hematology Recent Labs  Lab 07/09/19 1956  WBC 6.9  RBC 3.89  HGB 10.8*  HCT 34.6*  MCV 88.9  MCH 27.8  MCHC 31.2  RDW 14.8  PLT 176   BNP Recent Labs  Lab 07/09/19 1956  BNP 24.1    DDimer  Recent Labs  Lab 07/10/19 0951  DDIMER <0.27     Radiology/Studies:  DG Chest 2 View  Result Date: 07/09/2019 CLINICAL DATA:  Shortness of breath. EXAM: CHEST - 2 VIEW COMPARISON:  July 09, 2016 FINDINGS: There is no evidence of acute infiltrate, pleural effusion or pneumothorax. The heart size and mediastinal contours are within normal limits. The visualized skeletal structures are unremarkable. IMPRESSION: No active cardiopulmonary disease. Electronically Signed   By: Virgina Norfolk M.D.   On: 07/09/2019 20:16   ECHOCARDIOGRAM COMPLETE  Result Date: 07/10/2019    ECHOCARDIOGRAM REPORT  Patient Name:   HERMINA BARNARD Date of Exam: 07/10/2019 Medical Rec #:  [id number]   Height:       63.0 in Accession #:    [id number]  Weight:       196.4 lb Date of Birth:  [birth year]   BSA:          1.919 m Patient Age:    61 years    BP:           148/81 mmHg Patient Gender: F           HR:           61 bpm. Exam Location:  Inpatient Procedure: 2D Echo Indications:    volume overload  History:        Patient has prior history of Echocardiogram examinations, most                 recent 08/27/2017.  Sonographer:    Johny Chess Referring Phys: [id number] NISCHAL NARENDRA IMPRESSIONS  1. Left ventricular ejection fraction, by estimation, is 65 to 70%. The left ventricle has normal function. The left ventricle has no regional wall motion abnormalities. Left ventricular diastolic parameters were normal.  2. Right ventricular systolic function is normal. The right ventricular size is normal. There is moderately elevated pulmonary artery systolic pressure.  3. The mitral valve is normal in structure. Trivial mitral valve regurgitation. No evidence of mitral stenosis.  4. The aortic valve is tricuspid. Aortic valve regurgitation is not visualized. No aortic stenosis is present.  5. The inferior vena cava is dilated in size with <50% respiratory variability, suggesting right atrial pressure of 15 mmHg. FINDINGS  Left Ventricle: Left ventricular ejection fraction, by estimation, is 65 to 70%. The left ventricle has normal function. The left ventricle has no regional wall motion abnormalities. The left ventricular internal cavity size was normal in size. There is  no left ventricular hypertrophy. Left ventricular diastolic parameters were normal. Indeterminate filling pressures. Right Ventricle: The right ventricular size is normal. No increase in right ventricular wall thickness. Right ventricular systolic function is normal. There is moderately elevated pulmonary artery systolic pressure. The tricuspid regurgitant velocity is 2.88 m/s, and with an assumed right atrial pressure of 15 mmHg, the estimated right ventricular systolic pressure is 84.5 mmHg. Left Atrium: Left atrial size was normal in size. Right Atrium: Right atrial size was normal in size. Pericardium: There is no evidence of pericardial effusion. Mitral Valve: The mitral valve is normal in structure. Normal mobility of the mitral valve leaflets. Trivial mitral valve regurgitation. No evidence of mitral valve stenosis. Tricuspid Valve: The tricuspid valve is normal in  structure. Tricuspid valve regurgitation is trivial. No evidence of tricuspid stenosis. Aortic Valve: The aortic valve is tricuspid. Aortic valve regurgitation is not visualized. No aortic stenosis is present. Pulmonic Valve: The pulmonic valve was normal in structure. Pulmonic valve regurgitation is not visualized. No evidence of pulmonic stenosis. Aorta: The aortic root is normal in size and structure. Venous: The inferior vena cava is dilated in size with less than 50% respiratory variability, suggesting right atrial pressure of 15 mmHg. IAS/Shunts: No atrial level shunt detected by color flow Doppler.  LEFT VENTRICLE PLAX 2D LVIDd:         4.00 cm  Diastology LVIDs:         2.20 cm  LV e' lateral:   11.60 cm/s LV PW:         1.00 cm  LV E/e' lateral: 9.3  LV IVS:        0.90 cm  LV e' medial:    8.38 cm/s LVOT diam:     2.00 cm  LV E/e' medial:  12.9 LV SV:         87 LV SV Index:   46 LVOT Area:     3.14 cm  RIGHT VENTRICLE             IVC RV S prime:     13.50 cm/s  IVC diam: 2.50 cm TAPSE (M-mode): 2.7 cm LEFT ATRIUM             Index       RIGHT ATRIUM           Index LA diam:        3.10 cm 1.62 cm/m  RA Area:     12.50 cm LA Vol (A2C):   51.1 ml 26.63 ml/m RA Volume:   29.10 ml  15.17 ml/m LA Vol (A4C):   29.7 ml 15.48 ml/m LA Biplane Vol: 38.9 ml 20.27 ml/m  AORTIC VALVE LVOT Vmax:   136.00 cm/s LVOT Vmean:  80.000 cm/s LVOT VTI:    0.278 m  AORTA Ao Root diam: 2.80 cm MITRAL VALVE                TRICUSPID VALVE MV Area (PHT): 3.42 cm     TR Peak grad:   33.2 mmHg MV Decel Time: 222 msec     TR Vmax:        288.00 cm/s MV E velocity: 108.00 cm/s MV A velocity: 99.80 cm/s   SHUNTS MV E/A ratio:  1.08         Systemic VTI:  0.28 m                             Systemic Diam: 2.00 cm Skeet Latch MD Electronically signed by Skeet Latch MD Signature Date/Time: 07/10/2019/12:22:42 PM    Final    VAS [place] LOWER EXTREMITY VENOUS (DVT)  Result Date: 07/10/2019  Lower Venous DVTStudy Indications:  Edema. Other Indications: Bilateral lower edema, L>R. Comparison Study: 06/20/16 - Negative LEV Performing Technologist: Darlin Coco  Examination Guidelines: A complete evaluation includes B-mode imaging, spectral Doppler, color Doppler, and power Doppler as needed of all accessible portions of each vessel. Bilateral testing is considered an integral part of a complete examination. Limited examinations for reoccurring indications may be performed as noted. The reflux portion of the exam is performed with the patient in reverse Trendelenburg.  +---------+---------------+---------+-----------+----------+--------------+ RIGHT    CompressibilityPhasicitySpontaneityPropertiesThrombus Aging +---------+---------------+---------+-----------+----------+--------------+ CFV      Full           Yes      Yes                                 +---------+---------------+---------+-----------+----------+--------------+ SFJ      Full                                                        +---------+---------------+---------+-----------+----------+--------------+ FV Prox  Full                                                        +---------+---------------+---------+-----------+----------+--------------+  FV Mid   Full                                                        +---------+---------------+---------+-----------+----------+--------------+ FV DistalFull                                                        +---------+---------------+---------+-----------+----------+--------------+ PFV      Full                                                        +---------+---------------+---------+-----------+----------+--------------+ POP      Full           Yes      Yes                                 +---------+---------------+---------+-----------+----------+--------------+ PTV      Full                                                         +---------+---------------+---------+-----------+----------+--------------+ PERO     Full                                                        +---------+---------------+---------+-----------+----------+--------------+   +---------+---------------+---------+-----------+----------+--------------+ LEFT     CompressibilityPhasicitySpontaneityPropertiesThrombus Aging +---------+---------------+---------+-----------+----------+--------------+ CFV      Full           Yes      Yes                                 +---------+---------------+---------+-----------+----------+--------------+ SFJ      Full                                                        +---------+---------------+---------+-----------+----------+--------------+ FV Prox  Full                                                        +---------+---------------+---------+-----------+----------+--------------+ FV Mid   Full                                                        +---------+---------------+---------+-----------+----------+--------------+  FV DistalFull                                                        +---------+---------------+---------+-----------+----------+--------------+ PFV      Full                                                        +---------+---------------+---------+-----------+----------+--------------+ POP      Full           Yes      Yes                                 +---------+---------------+---------+-----------+----------+--------------+ PTV      Full                                                        +---------+---------------+---------+-----------+----------+--------------+ PERO     Full                                                        +---------+---------------+---------+-----------+----------+--------------+     Summary: BILATERAL: - No evidence of deep vein thrombosis seen in the lower extremities, bilaterally. -No evidence of  popliteal cyst, bilaterally.   *See table(s) above for measurements and observations.    Preliminary      Assessment and Plan:   1. Lower extremity edema, dyspnea, orthopnea 2. Atypical pleuritic chest discomfort 3. Moderate pulmonary HTN by echocardiogram, although right heart function is normal by echocardiogram 4. Multiple sclerosis, on Tysabri  Tamara [name] presents with a history that sounds suggestive of fluid overload, although exam is not impressive for such. The edema reported on examination on arrival appears to have resolved with elevation of her lower extremities. She did report SOB and orthopnea as well as left IV site bruising/swelling that occurred in the weeks folowing her last Tysabri infusion. She also has had intermittent sporadic atypical pleuritic chest discomfort without any clear anginal symptoms. She had been taking Tylenol, sleeping upright and limiting her activity at home which improved her symptoms but seems to have resulted in lower extremity edema. She has not had troponin testing, but symptoms are not consistent with an ACS. EKG shows a chronic RBBB. CXR is clear and BNP is normal. She is not tachypneic, tachycardic, or hypoxic, and d-dimer is normal which argues against PE. Her examination today does not reveal any significant peripheral edema on exam and she is able to lie flat without orthopnea. I discussed with Dr. Audie Box - see below for his thoughts. He agrees she does not appear volume overloaded on exam and would not pursue RHC at present time.Tysabri itself also appears to have a 5-6% incidence of peripheral edema, so question whether this precipitated her symptoms - there could also be a component of  venous insufficiency as well. She does snore at night which raises question of untreated OSA which may contribute to a degree of pulmonary HTN. She has no other connective tissue disorders that would raise question for a primary PAH (and her right heart function is  completely normal). Would hold off HCTZ at discharge given her hypotension this admission. Will place TED hose and arrange outpatient sleep study with cardiology follow-up with Dr. Audie Box.  For questions or updates, please contact [place] Please consult [email or web address] for contact info under    Signed, Charlie Pitter, PA-C  07/10/2019 3:38 PM

## 2019-07-10 NOTE — Progress Notes (Signed)
  Echocardiogram 2D Echocardiogram has been performed.  Tamara [name] 07/10/2019, 9:30 AM

## 2019-07-10 NOTE — Addendum Note (Signed)
Addended by: Lalla Brothers T on: 07/10/2019 09:15 AM   Modules accepted: Level of Service

## 2019-07-11 ENCOUNTER — Telehealth: Payer: Self-pay

## 2019-07-11 NOTE — Progress Notes (Signed)
Pt came to the desk with discharge papers and suitcase and wanted someone to to look at her Lt FA the location of her IV that was removed for discharge Stated there was a rash developing. Pt. showed me pictures of her arm during a previous admission and stated she was diagnosised with phlebitis. Pt. stated "I do not want this to hold up my dischagre home, I have things to do." Instructed pt to keep an eye on site and contact doctor if it gets like it was previously.

## 2019-07-11 NOTE — Plan of Care (Signed)
  Problem: Clinical Measurements: Goal: Will remain free from infection Outcome: Completed/Met   Problem: Activity: Goal: Risk for activity intolerance will decrease Outcome: Completed/Met   Problem: Nutrition: Goal: Adequate nutrition will be maintained Outcome: Completed/Met   Problem: Coping: Goal: Level of anxiety will decrease Outcome: Completed/Met

## 2019-07-11 NOTE — Progress Notes (Signed)
Discharge education and medication education given to patient with teach back. All questions and concerns answered. Peripheral IV and telemetry leads removed. All patient belongings given to patient. Patient transported to main entrance by nurse tech via wheelchair.

## 2019-07-11 NOTE — Telephone Encounter (Signed)
Hospital TOC, discharge 07/11/2019, appt 07/18/2019.

## 2019-07-11 NOTE — Progress Notes (Signed)
Subjective: [name] is seen at bedside today. We discussed her echo findings. We discussed the plan to have an outpatient sleep study and follow up with cardiology. She was amenable. We recommended compression stockings for her swelling.  Consults: na  Objective:  Vital signs in last 24 hours: Vitals:   07/10/19 1928 07/10/19 2126 07/11/19 0006 07/11/19 0547  BP: (!) 97/44 (!) 103/51 99/64 122/72  Pulse: 70 78 60 60  Resp: 20   18  Temp: 98.3 F (36.8 C)  98 F (36.7 C) 97.9 F (36.6 C)  TempSrc: Oral  Oral Oral  SpO2: 100%  100% 100%  Weight:    88.5 kg   Physical Exam Vitals and nursing note reviewed.  Constitutional:      General: She is not in acute distress.    Appearance: Normal appearance. She is not ill-appearing.  HENT:     Head: Normocephalic and atraumatic.  Cardiovascular:     Rate and Rhythm: Normal rate and regular rhythm.     Heart sounds: No murmur heard.      Comments: No LE edema Pulmonary:     Effort: Pulmonary effort is normal. No respiratory distress.     Breath sounds: Normal breath sounds.  Abdominal:     General: Abdomen is flat. Bowel sounds are normal. There is no distension.     Tenderness: There is no abdominal tenderness.  Neurological:     Mental Status: She is alert.    I/Os:  Intake/Output Summary (Last 24 hours) at 07/11/2019 0845 Last data filed at 07/11/2019 0600 Gross per 24 hour  Intake 723 ml  Output 2600 ml  Net -1877 ml   Telemetry:  Labs: Results for orders placed or performed during the hospital encounter of 07/09/19 (from the past 24 hour(s))  D-dimer, quantitative (not at [facility])     Status: None   Collection Time: 07/10/19  9:51 AM  Result Value Ref Range   D-Dimer, Quant <0.27 0.00 - 0.50 ug/mL-FEU   Imaging: ECHOCARDIOGRAM COMPLETE  Result Date: 07/10/2019    ECHOCARDIOGRAM REPORT   Patient Name:   Tamara [name] Date of Exam: 07/10/2019 Medical Rec #:  [id number]   Height:       63.0 in Accession #:     [id number]  Weight:       196.4 lb Date of Birth:  Feb 22, 1960   BSA:          1.919 m Patient Age:    59 years    BP:           148/81 mmHg Patient Gender: F           HR:           61 bpm. Exam Location:  Inpatient Procedure: 2D Echo Indications:    volume overload  History:        Patient has prior history of Echocardiogram examinations, most                 recent 08/27/2017.  Sonographer:    Johny Chess Referring Phys: [id number] NISCHAL NARENDRA IMPRESSIONS  1. Left ventricular ejection fraction, by estimation, is 65 to 70%. The left ventricle has normal function. The left ventricle has no regional wall motion abnormalities. Left ventricular diastolic parameters were normal.  2. Right ventricular systolic function is normal. The right ventricular size is normal. There is moderately elevated pulmonary artery systolic pressure.  3. The mitral valve is normal in structure. Trivial mitral valve regurgitation. No  evidence of mitral stenosis.  4. The aortic valve is tricuspid. Aortic valve regurgitation is not visualized. No aortic stenosis is present.  5. The inferior vena cava is dilated in size with <50% respiratory variability, suggesting right atrial pressure of 15 mmHg. FINDINGS  Left Ventricle: Left ventricular ejection fraction, by estimation, is 65 to 70%. The left ventricle has normal function. The left ventricle has no regional wall motion abnormalities. The left ventricular internal cavity size was normal in size. There is  no left ventricular hypertrophy. Left ventricular diastolic parameters were normal. Indeterminate filling pressures. Right Ventricle: The right ventricular size is normal. No increase in right ventricular wall thickness. Right ventricular systolic function is normal. There is moderately elevated pulmonary artery systolic pressure. The tricuspid regurgitant velocity is 2.88 m/s, and with an assumed right atrial pressure of 15 mmHg, the estimated right ventricular systolic pressure is  51.0 mmHg. Left Atrium: Left atrial size was normal in size. Right Atrium: Right atrial size was normal in size. Pericardium: There is no evidence of pericardial effusion. Mitral Valve: The mitral valve is normal in structure. Normal mobility of the mitral valve leaflets. Trivial mitral valve regurgitation. No evidence of mitral valve stenosis. Tricuspid Valve: The tricuspid valve is normal in structure. Tricuspid valve regurgitation is trivial. No evidence of tricuspid stenosis. Aortic Valve: The aortic valve is tricuspid. Aortic valve regurgitation is not visualized. No aortic stenosis is present. Pulmonic Valve: The pulmonic valve was normal in structure. Pulmonic valve regurgitation is not visualized. No evidence of pulmonic stenosis. Aorta: The aortic root is normal in size and structure. Venous: The inferior vena cava is dilated in size with less than 50% respiratory variability, suggesting right atrial pressure of 15 mmHg. IAS/Shunts: No atrial level shunt detected by color flow Doppler.  LEFT VENTRICLE PLAX 2D LVIDd:         4.00 cm  Diastology LVIDs:         2.20 cm  LV e' lateral:   11.60 cm/s LV PW:         1.00 cm  LV E/e' lateral: 9.3 LV IVS:        0.90 cm  LV e' medial:    8.38 cm/s LVOT diam:     2.00 cm  LV E/e' medial:  12.9 LV SV:         87 LV SV Index:   46 LVOT Area:     3.14 cm  RIGHT VENTRICLE             IVC RV S prime:     13.50 cm/s  IVC diam: 2.50 cm TAPSE (M-mode): 2.7 cm LEFT ATRIUM             Index       RIGHT ATRIUM           Index LA diam:        3.10 cm 1.62 cm/m  RA Area:     12.50 cm LA Vol (A2C):   51.1 ml 26.63 ml/m RA Volume:   29.10 ml  15.17 ml/m LA Vol (A4C):   29.7 ml 15.48 ml/m LA Biplane Vol: 38.9 ml 20.27 ml/m  AORTIC VALVE LVOT Vmax:   136.00 cm/s LVOT Vmean:  80.000 cm/s LVOT VTI:    0.278 m  AORTA Ao Root diam: 2.80 cm MITRAL VALVE                TRICUSPID VALVE MV Area (PHT): 3.42 cm     TR Peak  grad:   33.2 mmHg MV Decel Time: 222 msec     TR Vmax:         288.00 cm/s MV E velocity: 108.00 cm/s MV A velocity: 99.80 cm/s   SHUNTS MV E/A ratio:  1.08         Systemic VTI:  0.28 m                             Systemic Diam: 2.00 cm Skeet Latch MD Electronically signed by Skeet Latch MD Signature Date/Time: 07/10/2019/12:22:42 PM    Final    VAS [place] LOWER EXTREMITY VENOUS (DVT)  Result Date: 07/10/2019  Lower Venous DVTStudy Indications: Edema. Other Indications: Bilateral lower edema, L>R. Comparison Study: 06/20/16 - Negative LEV Performing Technologist: Darlin Coco  Examination Guidelines: A complete evaluation includes B-mode imaging, spectral Doppler, color Doppler, and power Doppler as needed of all accessible portions of each vessel. Bilateral testing is considered an integral part of a complete examination. Limited examinations for reoccurring indications may be performed as noted. The reflux portion of the exam is performed with the patient in reverse Trendelenburg.  +---------+---------------+---------+-----------+----------+--------------+ RIGHT    CompressibilityPhasicitySpontaneityPropertiesThrombus Aging +---------+---------------+---------+-----------+----------+--------------+ CFV      Full           Yes      Yes                                 +---------+---------------+---------+-----------+----------+--------------+ SFJ      Full                                                        +---------+---------------+---------+-----------+----------+--------------+ FV Prox  Full                                                        +---------+---------------+---------+-----------+----------+--------------+ FV Mid   Full                                                        +---------+---------------+---------+-----------+----------+--------------+ FV DistalFull                                                        +---------+---------------+---------+-----------+----------+--------------+ PFV      Full                                                         +---------+---------------+---------+-----------+----------+--------------+ POP      Full           Yes      Yes                                 +---------+---------------+---------+-----------+----------+--------------+  PTV      Full                                                        +---------+---------------+---------+-----------+----------+--------------+ PERO     Full                                                        +---------+---------------+---------+-----------+----------+--------------+   +---------+---------------+---------+-----------+----------+--------------+ LEFT     CompressibilityPhasicitySpontaneityPropertiesThrombus Aging +---------+---------------+---------+-----------+----------+--------------+ CFV      Full           Yes      Yes                                 +---------+---------------+---------+-----------+----------+--------------+ SFJ      Full                                                        +---------+---------------+---------+-----------+----------+--------------+ FV Prox  Full                                                        +---------+---------------+---------+-----------+----------+--------------+ FV Mid   Full                                                        +---------+---------------+---------+-----------+----------+--------------+ FV DistalFull                                                        +---------+---------------+---------+-----------+----------+--------------+ PFV      Full                                                        +---------+---------------+---------+-----------+----------+--------------+ POP      Full           Yes      Yes                                 +---------+---------------+---------+-----------+----------+--------------+ PTV      Full                                                         +---------+---------------+---------+-----------+----------+--------------+  PERO     Full                                                        +---------+---------------+---------+-----------+----------+--------------+     Summary: BILATERAL: - No evidence of deep vein thrombosis seen in the lower extremities, bilaterally. -No evidence of popliteal cyst, bilaterally.   *See table(s) above for measurements and observations. Electronically signed by Servando Snare MD on 07/10/2019 at 5:00:40 PM.    Final    Assessment/Plan:  Ms. Deamer is a 59 yo F w/ a PMHx of MS, HTN, GERD and IBS presenting w/ 2 weeks of LE edema and shortness of breath.  Plan by Problem: LE Edema: Shortness of Breath: -pt presenting with 2 weeks of LE edema, L>R, orthopnea and paroxysmal nocturnal dyspnea  -she originally presented to the outpatient clinic and due to concern for new heart failure she was admitted for further work up; of note she did have Echo in 2019 w/ LVEF of 65-70% and grade 2 diastolic dysfunction -story unusual in that her left leg swelled prior to her right; she also endorses some left arm swelling -pt denied recent travel or sedentary behavior -she deniedpersonal and family hx of blood clots -unclear etiology for her volume overload otherwise; no hx of hepatic disease or renal disease; 2019 echo w/ grade 2 diastolic dysfunction -however, chest x-ray without signs of volume overload, BNP normal making heart failure appear less likely -no abnormal LFTS, Creatinine only slightly elevated but appears at baseline, normal albumin so hepatic and renal causes unlikely -D-dimer normal, LE dopplers without evidence of DVT -Echo w/ EF of 65-70% w/ normal diastolic function, trivial TR, mild pulmonary hypertension -cardiology consulted and believes patient's edema due to venous insufficiency and recommends compression stockings; for mild pulmonary hypertension he does not recommend right heart cath  but does recommend sleep study to screen for sleep apnea  Plan: -dc home w/ outpatient sleep study and cards fu -compression stockings  HTN: -bp stable without hctz  Plan: -dc home hctz  MS: -supposed to have natalizumab infusion yesterday -contacted neurologist and infusion center nurse to have infusion rescheduled  Plan: -continue oxybutynin and topiramate  GERD: -continue pantoprazole  Dispo: Anticipated discharge today.  Al Decant, MD 07/11/2019, 8:45 AM Pager: [phone]

## 2019-07-14 ENCOUNTER — Non-Acute Institutional Stay (HOSPITAL_COMMUNITY)
Admit: 2019-07-14 | Discharge: 2019-07-14 | Disposition: A | Discharge status: Home / Self Care | Payer: 59 | Attending: Internal Medicine | Admitting: Internal Medicine

## 2019-07-14 ENCOUNTER — Other Ambulatory Visit: Payer: Self-pay

## 2019-07-14 ENCOUNTER — Other Ambulatory Visit: Payer: Self-pay | Admitting: Neurology

## 2019-07-14 ENCOUNTER — Telehealth: Payer: Self-pay | Admitting: Neurology

## 2019-07-14 DIAGNOSIS — G35 Multiple sclerosis: Secondary | ICD-10-CM | POA: Diagnosis present

## 2019-07-14 MED ORDER — SODIUM CHLORIDE 0.9 % IV SOLN
INTRAVENOUS | Status: DC | PRN
  Administered 2019-07-14: 250 mL via INTRAVENOUS

## 2019-07-14 MED ORDER — SODIUM CHLORIDE 0.9 % IV SOLN
300.0000 mg | INTRAVENOUS | Status: DC
  Administered 2019-07-14: 300 mg via INTRAVENOUS
  Filled 2019-07-14: qty 15

## 2019-07-14 MED ORDER — ALPRAZOLAM 1 MG PO TABS: ORAL_TABLET | ORAL | 0 refills | Status: DC

## 2019-07-14 NOTE — Telephone Encounter (Signed)
Approved see phone note from 07/14/19.

## 2019-07-14 NOTE — Telephone Encounter (Signed)
I sent in a prescription a couple pills of Xanax to take before the MRI.  According to her discharge note from her recent hospitalization, she should stop the hydrochlorothiazide.  If she continues to have shortness of breath she needs to discuss with her primary care or cardiology

## 2019-07-14 NOTE — Telephone Encounter (Signed)
TOC telephone call placed to patient, no answer, no VM. SChaplin, RN,BSN

## 2019-07-14 NOTE — Telephone Encounter (Signed)
no to the covid questions MR Brain w/wo contrast & MR Cervical spine w/wo contrast Dr. Vernell Leep [facility] Auth: [id number] & [id number] (exp. 07/10/19 to 10/08/19). Patient is scheduled at [facility] for 07/15/19.   She also wanted Dr. Felecia Shelling to be aware that she was in the hospital last week and wants him to review her notes to tell her what is going on with her body. She states she is still having breathing problems. They took her off the fluid pills and isn't sure if she should go back on.

## 2019-07-14 NOTE — Progress Notes (Signed)
Patient received IV Tysabri as ordered by Arlice Colt, MD. Observed for at least 60 minutes post infusion.Tolerated well, vitals stable, discharge instructions given, verbalized understanding. Patient alert, oriented and ambulatory at the time of discharge.

## 2019-07-14 NOTE — Telephone Encounter (Signed)
She is also claustrophobic and will need something to help her.

## 2019-07-14 NOTE — Discharge Instructions (Signed)
Natalizumab injection What is this medicine? NATALIZUMAB (na ta LIZ you mab) is used to treat relapsing multiple sclerosis. This drug is not a cure. It is also used to treat Crohn's disease. This medicine may be used for other purposes; ask your health care provider or pharmacist if you have questions. COMMON BRAND NAME(S): Tysabri What should I tell my health care provider before I take this medicine? They need to know if you have any of these conditions:  immune system problems  progressive multifocal leukoencephalopathy (PML)  an unusual or allergic reaction to natalizumab, other medicines, foods, dyes, or preservatives  pregnant or trying to get pregnant  breast-feeding How should I use this medicine? This medicine is for infusion into a vein. It is given by a health care professional in a hospital or clinic setting. A special MedGuide will be given to you by the pharmacist with each prescription and refill. Be sure to read this information carefully each time. Talk to your pediatrician regarding the use of this medicine in children. This medicine is not approved for use in children. Overdosage: If you think you have taken too much of this medicine contact a poison control center or emergency room at once. NOTE: This medicine is only for you. Do not share this medicine with others. What if I miss a dose? It is important not to miss your dose. Call your doctor or health care professional if you are unable to keep an appointment. What may interact with this medicine? Do not take this medicine with any of the following medications:  biologic medicines such as adalimumab, certolizumab, etanercept, golimumab, infliximab This medicine may also interact with the following medications:  azathioprine  cyclosporine  interferons  6-mercaptopurine  methotrexate  other medicines that lower your chance of fighting an infection  steroid medicines like prednisone or  cortisone  vaccines This list may not describe all possible interactions. Give your health care provider a list of all the medicines, herbs, non-prescription drugs, or dietary supplements you use. Also tell them if you smoke, drink alcohol, or use illegal drugs. Some items may interact with your medicine. What should I watch for while using this medicine? Your condition will be monitored carefully while you are receiving this medicine. Visit your doctor for regular check ups. Tell your doctor or healthcare professional if your symptoms do not start to get better or if they get worse. Stay away from people who are sick. Call your doctor or health care professional for advice if you get a fever, chills or sore throat, or other symptoms of a cold or flu. Do not treat yourself. In some patients, this medicine may cause a serious brain infection that may cause death. If you have any problems seeing, thinking, speaking, walking, or standing, tell your doctor right away. If you cannot reach your doctor, get urgent medical care. What side effects may I notice from receiving this medicine? Side effects that you should report to your doctor or health care professional as soon as possible:  allergic reactions like skin rash, itching or hives, swelling of the face, lips, or tongue  breathing problems  changes in vision  chest pain  confusion  depressed mood  dizziness  feeling faint; lightheaded; falls  general ill feeling or flu-like symptoms  loss of memory  missed menstrual periods  muscle weakness  problems with balance, talking, or walking  signs and symptoms of liver injury like dark yellow or brown urine; general ill feeling or flu-like symptoms; light-colored  stools; loss of appetite; nausea; right upper belly pain; unusually weak or tired; yellowing of the eyes or skin  suicidal thoughts, mood changes  unusual bruising or bleeding  unusually weak or tired Side effects that  usually do not require medical attention (report to your doctor or health care professional if they continue or are bothersome):  headache  joint pain  muscle cramps  muscle pain  nausea, vomiting  pain, redness, or irritation at site where injected  tiredness This list may not describe all possible side effects. Call your doctor for medical advice about side effects. You may report side effects to FDA at 1-800-FDA-1088. Where should I keep my medicine? This drug is given in a hospital or clinic and will not be stored at home. NOTE: This sheet is a summary. It may not cover all possible information. If you have questions about this medicine, talk to your doctor, pharmacist, or health care provider.  2020 Elsevier/Gold Standard (2018-07-22 13:20:26)  

## 2019-07-15 ENCOUNTER — Ambulatory Visit (HOSPITAL_COMMUNITY): Payer: Medicaid Other

## 2019-07-15 ENCOUNTER — Ambulatory Visit: Payer: 59

## 2019-07-15 DIAGNOSIS — G35 Multiple sclerosis: Secondary | ICD-10-CM

## 2019-07-15 MED ORDER — GADOBENATE DIMEGLUMINE 529 MG/ML IV SOLN
20.0000 mL | Freq: Once | INTRAVENOUS | Status: AC | PRN
  Administered 2019-07-15: 20 mL via INTRAVENOUS

## 2019-07-15 NOTE — Telephone Encounter (Signed)
Pt called and advised Xanax has been sent and recommendation to f/u with pcp or cardiology if shortness of breath continues.

## 2019-07-18 ENCOUNTER — Ambulatory Visit (INDEPENDENT_AMBULATORY_CARE_PROVIDER_SITE_OTHER): Payer: 59 | Admitting: Internal Medicine

## 2019-07-18 ENCOUNTER — Encounter: Payer: Self-pay | Admitting: Internal Medicine

## 2019-07-18 VITALS — BP 119/67 | HR 72 | Temp 98.1°F | Ht 63.0 in | Wt 200.2 lb

## 2019-07-18 DIAGNOSIS — G4730 Sleep apnea, unspecified: Secondary | ICD-10-CM | POA: Diagnosis not present

## 2019-07-18 DIAGNOSIS — R600 Localized edema: Secondary | ICD-10-CM

## 2019-07-18 NOTE — Assessment & Plan Note (Signed)
Pt states that she does not get consistently sleep at night and snores frequently. She has never been diagnosed with sleep apnea. During her recent hospitalization, this was discussed with her by the internal medicine team and cardiology team. Pt would likely benefit form a sleep study in the near future.   Plan: -sleep study ordered

## 2019-07-18 NOTE — Progress Notes (Signed)
   CC: Hospital follow up   HPI: [name] is a 59 y.o. with a hx as noted below who presents for hospital f/u. Please refer tot he problem based charting for details.   Past Medical History:  Diagnosis Date  . Anemia   . Bowel obstruction ([place])   . CKD (chronic kidney disease), stage II    based on labs  . GERD (gastroesophageal reflux disease)   . IBS (irritable bowel syndrome)    at age of 42  . Multiple sclerosis ([place])   . RBBB   . Sciatica 2009  . Vision abnormalities    Review of Systems:  Negative unless mentioned in the HPI   Physical Exam: Vitals:   07/18/19 0954  BP: 119/67  Pulse: 72  Temp: 98.1 F (36.7 C)  TempSrc: Oral  SpO2: 100%  Weight: 200 lb 3.2 oz (90.8 kg)   Physical Exam Constitutional:      General: She is not in acute distress.    Appearance: She is obese. She is ill-appearing. She is not toxic-appearing or diaphoretic.  HENT:     Head: Normocephalic and atraumatic.  Cardiovascular:     Rate and Rhythm: Normal rate and regular rhythm.     Heart sounds: Normal heart sounds. No murmur heard.  No friction rub. No gallop.   Pulmonary:     Effort: Pulmonary effort is normal. No respiratory distress.     Breath sounds: Normal breath sounds. No wheezing or rales.  Musculoskeletal:     Right lower leg: Edema (2+) present.     Left lower leg: Edema (2+) present.  Neurological:     General: No focal deficit present.     Mental Status: She is alert.  Psychiatric:        Mood and Affect: Mood normal.    Assessment & Plan:   See Encounters Tab for problem based charting.  Patient discussed with Dr. Philipp Ovens

## 2019-07-18 NOTE — Progress Notes (Signed)
Internal Medicine Clinic Attending  Case discussed with Dr. Alexander at the time of the visit.  We reviewed the resident's history and exam and pertinent patient test results.  I agree with the assessment, diagnosis, and plan of care documented in the resident's note.  

## 2019-07-18 NOTE — Patient Instructions (Signed)
Thank you for visiting [place] in clinic today.  Below is a summary of what we discussed:  1.  Lower leg swelling -Place compression stockings on both legs to help with the swelling. Pick this up at the pharmacy.  2.  Sleep apnea study -We have provided a referral to get a sleep study done.  They should call you to schedule an appointment.  If they do not, let us know so we can help you set this up.  If you have any questions or concerns in the meantime, please feel free to reach out to [place].

## 2019-07-18 NOTE — Assessment & Plan Note (Addendum)
Pt continues to have bilateral lower extremity edema. Pt states that it hasn't improved much since her last visit. During her hospitalization, she and a normal BNP and no significant findings of CHF on echocardiogram. As for the edema, she states that she continues to elevate her legs. She says that she remembers discussing using compression stockings with the Cardiologist when she was admitted to the hosptial last week, but hasn't used them yet.  Plan: -Compression stockings ordered -f/u with cardiology on 7/1 -Consider venous insufficiency doppler study if no improvement in the future.

## 2019-07-19 ENCOUNTER — Other Ambulatory Visit: Payer: Self-pay | Admitting: Neurology

## 2019-07-21 NOTE — Progress Notes (Signed)
2 packs of knee hi ted hoses placed in out going mail this morning.

## 2019-07-21 NOTE — Telephone Encounter (Signed)
Pt was seen on 6/18 by Dr Sheppard Coil.

## 2019-07-25 ENCOUNTER — Other Ambulatory Visit: Payer: Self-pay

## 2019-07-25 ENCOUNTER — Telehealth: Payer: Self-pay | Admitting: *Deleted

## 2019-07-25 ENCOUNTER — Encounter: Payer: Self-pay | Admitting: Neurology

## 2019-07-25 ENCOUNTER — Ambulatory Visit (INDEPENDENT_AMBULATORY_CARE_PROVIDER_SITE_OTHER): Payer: 59 | Admitting: Neurology

## 2019-07-25 ENCOUNTER — Telehealth: Payer: Self-pay | Admitting: Neurology

## 2019-07-25 VITALS — BP 112/82 | HR 69 | Ht 63.0 in | Wt 195.0 lb

## 2019-07-25 DIAGNOSIS — G35 Multiple sclerosis: Secondary | ICD-10-CM | POA: Diagnosis not present

## 2019-07-25 DIAGNOSIS — G373 Acute transverse myelitis in demyelinating disease of central nervous system: Secondary | ICD-10-CM

## 2019-07-25 DIAGNOSIS — Z79899 Other long term (current) drug therapy: Secondary | ICD-10-CM

## 2019-07-25 DIAGNOSIS — E559 Vitamin D deficiency, unspecified: Secondary | ICD-10-CM

## 2019-07-25 DIAGNOSIS — R269 Unspecified abnormalities of gait and mobility: Secondary | ICD-10-CM | POA: Diagnosis not present

## 2019-07-25 DIAGNOSIS — G35D Multiple sclerosis, unspecified: Secondary | ICD-10-CM

## 2019-07-25 NOTE — Telephone Encounter (Signed)
Placed JCV lab in [facility] lock box for routine lab pick up. Results pending. 

## 2019-07-25 NOTE — Telephone Encounter (Signed)
Pt did arrive 30 min late today and Dr Felecia Shelling agreed to see her today.

## 2019-07-25 NOTE — Telephone Encounter (Signed)
Pt was a no show to apt today.  

## 2019-07-25 NOTE — Progress Notes (Signed)
[facility]  PATIENT: Tamara [name] DOB: 05-01-1960  REFERRING DOCTOR OR PCP:  Nadean Corwin SOURCE: Patient, notes from emergency room and recent hospital stay ([facility]), imaging and lab reports, MRI images on PACS.  _________________________________   HISTORICAL  CHIEF COMPLAINT:  Chief Complaint  Patient presents with  . Follow-up    pt alone, rm 12. presents today following up. she is needing refill on phentermine    HISTORY OF PRESENT ILLNESS:  Tamara [name] Is a 59 y.o. woman who was diagnosed with multiple sclerosis in May 2018 after she presented with transverse myelitis.   Update 07/25/2019: She is on Tysabri and tolerates it well.  She denies any exacerbations.   Gait, strength and sensation are stable.    She has dysesthesias.   She takes tramadol pain is worse.     She could not tolerate gabapentin, Lyrica, lamotrigine and oxcarbazepine.   She had ankle swelling with nortriptyline.  NCV/EMG was fine.    I reviewed her MRi's from 07/2019 and compared to the 2019 brain and 2018 cervical spine .  There are no new lesions. She has old cervical spine lesions at C3 and C5.  The brain shows multiple chronic foci in the hemispheres.  None in brainstem or cerebellum.  No focus enhances.    She is on topiramate for headaches and phentermine as a stimulant with benefit  She has had a vit D deficiency and takes D3  Update 01/14/2019: She has reported more hand pain with an electric shock quality.  NCV/EMG study did not show any evidence of carpal tunnel syndrome, ulnar neuropathy or radiculopathy.  She feels her MS has been stable and she notes no recent change in her gait, strength or sensation.  She was unable to tolerate gabapentin, Lyrica, lamotrigine, nortriptyline or oxcarbazepine.  She was never on any of these medications long enough to know where the not there was much benefit.  Her MS has been stable on Tysabri.  She tolerates it well.  There were no new  lesions on her 2019 MRI.  Update 12/04/2018: She is on Tysabri and has tolerated it well.   Her last infusion was 3 weeks ago and next one is 12/11/18.   Her last MRI was 02/21/2017 and showed no new lesions.    She is having a lot of hand pain with left a little more than right.   She feels a Scientist, water quality.     She feels the MS is about the same    She is walking ok with legs dragging some    She has not had any falls.  She has some urinary frequency.   She feels dizzy if she look up    She has lost weight on the combination of phentermine with topiramate.     She felt more depressed the month she was off the Tysabri.       She takes tramadol when pain is worse.     She could not tolerate gabapentin, Lyrica, lamotrigine and oxcarbazepine.   She had ankle swelling with nortriptyline.   Her ankle swelling is better with HCTZ.     Update 06/03/2018: She feels her MS is stable and notes no new exacerbation.   She tolerates Tysabri well.   She was JCV b negative when last tested 12/27/18.  Gait, balance and strength are the same  She notes a static electric charge senation in her hands.    SHe has some toingling in her  hands and also notes an electic shock sensation.     She notes swelling in her ankles since starting nortriptyline late last year.    She notes pitting with finger impressions.   She would note some left foot swelling since an injury years ago.   She notes a compression stocking helps.   She notes milder swelling in her hands.      She has gained about 20 pounds over the last year.    She denies shortness of breath and no wheezes.   We discussed weight loss.  She has not lost any weight on phentermine.  We discussed adding Topamax phentermine as that may help her lose weight.  Update 12/26/2017: She is on Tysabri and she tolerates it well.  Her last JCV Ab was negative 02/28/2017.     She was stable until this week.   She is having more weakness in her left leg over the past 2  days.     She notes the leg is low as she walks and would drag if she didn't lift up higher.    She gets an electric sensation in her hands like static electric discharges.     She gets some dysesthesias in her left leg but they are milder.    She is trying to do bladder exercises to help with urgency and she notes some benefit.     She notes a lot of fatigue.    She also has obesity and is on phentermine.   She notes improvement in focus/attnetion and fatigue with the phentermine but has not lost weight.     Update 02/13/2017: She is on Tysabri and feels her MS is stable.   She does note that joint aches for 1-3 days after each infusion.   Sometimes she feels a little more tired for a week after the infusions.  She feels gait and balance are stable.  She still has a lot of dysesthesia.  She has some urinary urgency.   No incontinence.   She notes some blurry vision and black dots and night vision is worse.   Her MRI 05/2016 showed an enhancing focus at C3 and nonenhancing focus at C5 (and foci in brain nonenhancing)  She sees Dr. Otelia Sergeant for knees and shoulder pain.  He is trying to get her an exercise machine as she only gets 30 PT visits a year.     She is on Tylenol and tramadol for the pain.     She has fatigue helped by phentermine.   She has had trouble losing weight.  Phentermine has not helped weight loss though fatigue and wakefulness and apathy are much better.     From 10/11/2016:  MS:   She started Tysabri therapy July 2018 and she tolerates it well. The JCV antibody was negative (0.16) on 07/04/2016.     Gait/strength:  Her gait and balance improved after the second course of  IV Steroids and she feels she is still doing well.  The truncal dysestheias and leg heaviness also improved,   She still notes electric sensations in both hands.   Her hands also seem mildly weak and clumsy.  Her hand dysesthesias are very uncomfortable and limit some of her activities.  She has trouble holding heavy  pots.   Baclofen was stopped due to HA and swelling.    Numbness/dysesthesia:   Initially, she had a lot of numbness below the waist and into the groin. That has improved. She continues to  have numbness in the hands and she gets an uncomfortable shocklike dysesthesias   Gabapentin and Lyrica were not tolerated in the past.    Tegretol caused itching and Trileptal caused mild swelling in her mouth?  Bladder/bowel:  Bladder function is doing well. She has some urinary frequency and urgency but is not having any incontinence recently.   Vision:  She denied any major change in vision or diplopia.       Colors are normal and symmetric.     Fatigue/sleep:  She notes some fatigue, physical > cognitive.    The combination of being on Tysabri and the higher dose of phentermine have helped.   She is trying to exercise more.   The dysesthesias in her hands wake her up and sleep is poor many nights.    She notes fatigue and sleepiness are much better if she takes a nap, though then night time sleep is worse.  Mood/Cognition:  Depression and anxiety are better now that she is on a DMT and more accepting of the diagnosis.   Cognition is fine.  MS History:   On 06/10/16, she had the onset of numbness and clumsiness in her legs.   As the day went on, her hands also became numb and clumsy.  She felt numb from her waist down.    She went to the [facility] and had an xtay of her back and was referred to orthopedics     Two days later, she went to the ED when her symptoms worsened and she was seen and discharged.    She saw her internist who ordered a lumbar MRI.  The lumbar MRI showed degenerative changes that were mild at L4-L5 and the study was otherwise fairly normal.   She was referred to the ED and had an MRI of the brain, cervical spine and thoracic spine.    She was found to have a transverse myelitis in the cervical spine with an enhancing lesion in the posterior columns. There was also a smaller  non-enhancing focus at C5   She also had 5-6 spots in her brain, the largest being periventricular on the right.  She was admitted to [facility] and had 3 days of IV Solumedrol followed by an oral taper.    She was transferred to rehabilitation and was discharged 5/30.   She walked out and felt much better.   Additionally, while she was in the hospital (06/15/2016) she had a lumbar puncture. The CSF was abnormal showing showing the presence  4 oligoclonal bands and an elevated IgG index of 0.9 (less than 0.7 is normal). The myelin basic protein was mildly elevated at 2.2.      He had another exacerbation after returning home with left leg dragging and feeling more off balance with stumbling.   In retrospect, in 2017, she had an episode lasting a few weeks where she felt she was dragging her left leg some. This completely resolved and she did not have any other symptoms such as numbness at that time.   MRIs of the brain, cervical spine, thoracic spine and lumbar spine were personally reviewed. The MRI of the cervical spine shows an enhancing lesion posterior spinal cord adjacent to C2-C3. Additionally there is a nonenhancing focus adjacent to C5. In the brain, there are several T2/FLAIR hyperintense foci and the largest is in the right parietal lobe and there are 4 or 5 other small foci, one in the periventricular white matter of the  right frontal lobe and the rest in the subcortical white matter.    Laboratory studies show 4 oligoclonal bands and an elevated IgG index of 0.9. Myelin basic protein was mildly elevated at 2.2. She had elevated glucose between 120 and 140 several times while getting steroids.   Her Vit D was mildly low (25.6) and she just started OTC supplementation.   She started Tysabri in July 2018   REVIEW OF SYSTEMS: Constitutional: No fevers, chills, sweats, or change in appetite Eyes: No visual changes, double vision, eye pain Ear, nose and throat: No hearing loss, ear pain, nasal  congestion, sore throat Cardiovascular: No chest pain, palpitations Respiratory: No shortness of breath at rest or with exertion.   No wheezes GastrointestinaI: No nausea, vomiting, diarrhea, abdominal pain, fecal incontinence Genitourinary: No dysuria, urinary retention or frequency.  No nocturia. Musculoskeletal: No neck pain, back pain Integumentary: No rash, pruritus, skin lesions Neurological: as above Psychiatric: No depression at this time.  No anxiety Endocrine: No palpitations, diaphoresis, change in appetite, change in weigh or increased thirst Hematologic/Lymphatic: No anemia, purpura, petechiae. Allergic/Immunologic: No itchy/runny eyes, nasal congestion, recent allergic reactions, rashes  ALLERGIES: Allergies  Allergen Reactions  . Codeine Other (See Comments)    Delusions  . Dilaudid [Hydromorphone Hcl] Swelling and Other (See Comments)    Tongue swells   . Ditropan [Oxybutynin] Other (See Comments)    Burning sensation  . Other Swelling and Other (See Comments)    Unnamed gel or antiseptic solution- Was applied to IV site with a needle- Turned the skin "black and blue" that remained (caused burning and phlebitis, also)  . Paxil [Paroxetine Hcl] Other (See Comments)    Hallucinations and heavy periods   . Tegretol [Carbamazepine] Itching  . Trileptal [Oxcarbazepine] Swelling  . Adhesive [Tape] Hives, Itching, Rash and Other (See Comments)    PAPER TAPE  . Augmentin [Amoxicillin-Pot Clavulanate] Rash  . Gabapentin Rash and Other (See Comments)    Hallucinations and depression, also    HOME MEDICATIONS:  Current Outpatient Medications:  .  acetaminophen (TYLENOL) 650 MG CR tablet, Take 1,300 mg by mouth in the morning, at noon, and at bedtime., Disp: , Rfl:  .  ALPRAZolam (XANAX) 1 MG tablet, Take 1-2 tablets thirty minutes prior to MRI.  May take one additional tablet before entering scanner, if needed.  MUST HAVE DRIVER., Disp: 3 tablet, Rfl: 0 .   Cholecalciferol (VITAMIN D-3) 25 MCG (1000 UT) CAPS, Take 2,000 Units by mouth daily., Disp: , Rfl:  .  docusate sodium (COLACE) 100 MG capsule, Take 1 capsule (100 mg total) by mouth daily. (Patient taking differently: Take 100 mg by mouth daily as needed for mild constipation. ), Disp: 10 capsule, Rfl: 0 .  fluticasone (FLONASE) 50 MCG/ACT nasal spray, Place 2 sprays into both nostrils daily. (Patient taking differently: Place 2 sprays into both nostrils daily as needed for allergies or rhinitis. ), Disp: 16 g, Rfl: 2 .  loratadine (CLARITIN) 10 MG tablet, Take 10 mg by mouth daily as needed for allergies or rhinitis., Disp: , Rfl:  .  Multiple Vitamins-Minerals (HAIR/SKIN/NAILS) CAPS, Take 1 capsule by mouth in the morning, at noon, and at bedtime., Disp: , Rfl:  .  Multiple Vitamins-Minerals (ONE-A-DAY WOMENS PO), Take 1 tablet by mouth daily., Disp: , Rfl:  .  natalizumab (TYSABRI) 300 MG/15ML injection, Inject 300 mg into the vein every 30 (thirty) days., Disp: , Rfl:  .  Omega-3 Fatty Acids (FISH OIL) 1000 MG CAPS,  Take 1,000-2,000 mg by mouth See admin instructions. Take 2,000 mg by mouth in the morning and 1,000 mg at bedtime, Disp: , Rfl:  .  pantoprazole (PROTONIX) 40 MG tablet, Take 1 tablet (40 mg total) by mouth daily., Disp: 90 tablet, Rfl: 6 .  phentermine 37.5 MG capsule, Take 1 capsule by mouth once daily in the morning, Disp: 30 capsule, Rfl: 5 .  topiramate (TOPAMAX) 50 MG tablet, Take 1 tablet (50 mg total) by mouth 3 (three) times daily., Disp: 90 tablet, Rfl: 5 .  traMADol (ULTRAM) 50 MG tablet, TAKE 1 TABLET BY MOUTH TWICE DAILY AS NEEDED FOR SEVERE PAIN (Patient taking differently: Take 50 mg by mouth 2 (two) times daily as needed for severe pain. ), Disp: 60 tablet, Rfl: 0  PAST MEDICAL HISTORY: Past Medical History:  Diagnosis Date  . Anemia   . Bowel obstruction (HCC)   . CKD (chronic kidney disease), stage II    based on labs  . GERD (gastroesophageal reflux disease)    . IBS (irritable bowel syndrome)    at age of 30  . Multiple sclerosis (HCC)   . RBBB   . Sciatica 2009  . Vision abnormalities     PAST SURGICAL HISTORY: Past Surgical History:  Procedure Laterality Date  . ABDOMINAL HYSTERECTOMY  2008   cervix and right ovary still intact  . KNEE ARTHROSCOPY  2010 and 2011   Left knee, x2    FAMILY HISTORY: Family History  Problem Relation Age of Onset  . Cancer Father        Gallbladder  . Gallbladder disease Father   . Hypertension Brother   . Gallbladder disease Paternal Grandmother   . Colon cancer Neg Hx   . Colon polyps Neg Hx   . Esophageal cancer Neg Hx   . Kidney disease Neg Hx     SOCIAL HISTORY:  Social History   Socioeconomic History  . Marital status: Married    Spouse name: Not on file  . Number of children: 2  . Years of education: Not on file  . Highest education level: Not on file  Occupational History  . Occupation: Science writer  Tobacco Use  . Smoking status: Never Smoker  . Smokeless tobacco: Never Used  Substance and Sexual Activity  . Alcohol use: No    Alcohol/week: 0.0 standard drinks  . Drug use: No  . Sexual activity: Not on file  Other Topics Concern  . Not on file  Social History Narrative  . Not on file   Social Determinants of Health   Financial Resource Strain:   . Difficulty of Paying Living Expenses:   Food Insecurity:   . Worried About Programme researcher, broadcasting/film/video in the Last Year:   . Barista in the Last Year:   Transportation Needs:   . Freight forwarder (Medical):   Marland Kitchen Lack of Transportation (Non-Medical):   Physical Activity:   . Days of Exercise per Week:   . Minutes of Exercise per Session:   Stress:   . Feeling of Stress :   Social Connections:   . Frequency of Communication with Friends and Family:   . Frequency of Social Gatherings with Friends and Family:   . Attends Religious Services:   . Active Member of Clubs or Organizations:   . Attends Tax inspector Meetings:   Marland Kitchen Marital Status:   Intimate Partner Violence:   . Fear of Current or Ex-Partner:   .  Emotionally Abused:   Marland Kitchen Physically Abused:   . Sexually Abused:      PHYSICAL EXAM  Vitals:   07/25/19 0950  BP: 112/82  Pulse: 69  Weight: 195 lb (88.5 kg)  Height: 5\' 3"  (1.6 m)    Body mass index is 34.54 kg/m.   General: The patient is well-developed and well-nourished and in no acute distress   Neurologic Exam  Mental status: The patient is alert and oriented x 3 at the time of the examination. The patient has apparent normal recent and remote memory, with an apparently normal attention span and concentration ability.   Speech is normal.  Cranial nerves: Extraocular movements are full. Facial strength and sensation is normal. Trapezius strength is normal..  The tongue is midline, and the patient has symmetric elevation of the soft palate. No obvious hearing deficits are noted.  Motor:  Muscle bulk is normal.   Muscle tone is normal. Strength is 5/5 except 4+/5 hip flexion (iliopsoas).      Sensory: She appeared to have normal and symmetric sensation in the hands.  She did have a Tinel's sign on the left at the wrist over the median nerve.  Coordination: Cerebellar testing reveals good finger-nose-finger and normal heel-to-shin bilaterally.  Gait and station: Station is normal.   Her gait is mildly wide and the tandem gait is poor.  There is a mild left foot drop.   She does not have a Romberg sign.  Reflexes:  . Deep tendon reflexes are 3 and symmetric in the knees and ankles and 2 and symmetric in the arms.      DIAGNOSTIC DATA (LABS, IMAGING, TESTING) - I reviewed patient records, labs, notes, testing and imaging myself where available.  Lab Results  Component Value Date   WBC 6.9 07/09/2019   HGB 10.8 (L) 07/09/2019   HCT 34.6 (L) 07/09/2019   MCV 88.9 07/09/2019   PLT 176 07/09/2019      Component Value Date/Time   NA 141 07/10/2019 0519    NA 144 10/22/2018 1658   K 3.4 (L) 07/10/2019 0519   CL 112 (H) 07/10/2019 0519   CO2 23 07/10/2019 0519   GLUCOSE 103 (H) 07/10/2019 0519   BUN 17 07/10/2019 0519   BUN 13 10/22/2018 1658   CREATININE 1.13 (H) 07/10/2019 0519   CREATININE 1.01 07/08/2013 1440   CALCIUM 8.6 (L) 07/10/2019 0519   PROT 6.2 (L) 07/09/2019 1956   PROT 6.4 10/22/2018 1658   ALBUMIN 3.8 07/09/2019 1956   ALBUMIN 4.4 10/22/2018 1658   AST 16 07/09/2019 1956   ALT 15 07/09/2019 1956   ALKPHOS 59 07/09/2019 1956   BILITOT 0.5 07/09/2019 1956   BILITOT <0.2 10/22/2018 1658   GFRNONAA 54 (L) 07/10/2019 0519   GFRNONAA 64 07/08/2013 1440   GFRAA >60 07/10/2019 0519   GFRAA 74 07/08/2013 1440   No results found for: CHOL, HDL, LDLCALC, LDLDIRECT, TRIG, CHOLHDL No results found for: GNFA2Z Lab Results  Component Value Date   VITAMINB12 2,209 (H) 06/15/2016   Lab Results  Component Value Date   TSH 0.351 06/15/2016       ASSESSMENT AND PLAN  Multiple sclerosis (HCC) - Plan: Stratify JCV Antibody Test ([facility]), CBC with Differential/Platelet  High risk medication use - Plan: Stratify JCV Antibody Test ([facility]), CBC with Differential/Platelet  Gait disturbance  Transverse myelitis (HCC)  Vitamin D deficiency - Plan: VITAMIN D 25 Hydroxy (Vit-D Deficiency, Fractures)   1.    I discussed with  her that the numbness she is experiencing the hand is more likely to be related to MS as the NCV/EMG study did not show any evidence of carpal tunnel syndrome, ulnar neuropathy or radiculopathy. 2.     Continue Tysabri..   3.    Stay active and exercise.     4.    Lidocaine ointment twice a day for the pain in the hands 5.    RTC 6  months.   She is advised to call sooner if she has any new or worsening neurologic symptoms.   Tamara [name] A. Epimenio Foot, MD, PhD, [place]  07/25/2019, 7:17 PM Certified in Neurology, Clinical Neurophysiology, Sleep Medicine, Pain Medicine and Neuroimaging Dir., [facility]  [facility] [address], [address] [place], [place] [address] [phone]

## 2019-07-26 LAB — CBC WITH DIFFERENTIAL/PLATELET
Basophils Absolute: 0 10*3/uL (ref 0.0–0.2)
Basos: 0 %
EOS (ABSOLUTE): 0.1 10*3/uL (ref 0.0–0.4)
Eos: 1 %
Hematocrit: 36.3 % (ref 34.0–46.6)
Hemoglobin: 11.6 g/dL (ref 11.1–15.9)
Immature Grans (Abs): 0 10*3/uL (ref 0.0–0.1)
Immature Granulocytes: 0 %
Lymphocytes Absolute: 2.9 10*3/uL (ref 0.7–3.1)
Lymphs: 43 %
MCH: 28.2 pg (ref 26.6–33.0)
MCHC: 32 g/dL (ref 31.5–35.7)
MCV: 88 fL (ref 79–97)
Monocytes Absolute: 0.5 10*3/uL (ref 0.1–0.9)
Monocytes: 8 %
NRBC: 1 % — ABNORMAL HIGH (ref 0–0)
Neutrophils Absolute: 3.3 10*3/uL (ref 1.4–7.0)
Neutrophils: 48 %
Platelets: 177 10*3/uL (ref 150–450)
RBC: 4.12 x10E6/uL (ref 3.77–5.28)
RDW: 14.8 % (ref 11.7–15.4)
WBC: 6.9 10*3/uL (ref 3.4–10.8)

## 2019-07-26 LAB — VITAMIN D 25 HYDROXY (VIT D DEFICIENCY, FRACTURES): Vit D, 25-Hydroxy: 32.5 ng/mL (ref 30.0–100.0)

## 2019-07-30 ENCOUNTER — Ambulatory Visit: Payer: BLUE CROSS/BLUE SHIELD | Admitting: Neurology

## 2019-07-30 NOTE — Progress Notes (Deleted)
Cardiology Office Note:   Date:  07/30/2019  NAME:  Tamara [name]    MRN: [id number] DOB:  [birth year]   PCP:  Lucious Groves, DO  Cardiologist:  Evalina Field, MD  Electrophysiologist:  None   Referring MD: Lucious Groves, DO   No chief complaint on file. ***  History of Present Illness:   Tamara [name] is a 59 y.o. female with a hx of GERD, MS, RBBB who is being seen today for LE edema. Admitted in June with concerns for volume overload. BNP was normal. No evidence of volume overload on my exam. RVSP measures incorrectly and no evidence of pulmonary hypertension of heart failure.   Problem List 1. GERD 2. Multiple sclerosis 3. RBBB  Past Medical History: Past Medical History:  Diagnosis Date  . Anemia   . Bowel obstruction ([place])   . CKD (chronic kidney disease), stage II    based on labs  . GERD (gastroesophageal reflux disease)   . IBS (irritable bowel syndrome)    at age of 34  . Multiple sclerosis ([place])   . RBBB   . Sciatica 2009  . Vision abnormalities     Past Surgical History: Past Surgical History:  Procedure Laterality Date  . ABDOMINAL HYSTERECTOMY  2008   cervix and right ovary still intact  . KNEE ARTHROSCOPY  2010 and 2011   Left knee, x2    Current Medications: No outpatient medications have been marked as taking for the 07/31/19 encounter (Appointment) with O'Neal, Cassie Freer, MD.     Allergies:    Codeine, Dilaudid [hydromorphone hcl], Ditropan [oxybutynin], Other, Paxil [paroxetine hcl], Tegretol [carbamazepine], Trileptal [oxcarbazepine], Adhesive [tape], Augmentin [amoxicillin-pot clavulanate], and Gabapentin   Social History: Social History   Socioeconomic History  . Marital status: Married    Spouse name: Not on file  . Number of children: 2  . Years of education: Not on file  . Highest education level: Not on file  Occupational History  . Occupation: Armed forces technical officer  Tobacco Use  . Smoking status: Never Smoker  . Smokeless  tobacco: Never Used  Substance and Sexual Activity  . Alcohol use: No    Alcohol/week: 0.0 standard drinks  . Drug use: No  . Sexual activity: Not on file  Other Topics Concern  . Not on file  Social History Narrative  . Not on file   Social Determinants of Health   Financial Resource Strain:   . Difficulty of Paying Living Expenses:   Food Insecurity:   . Worried About Charity fundraiser in the Last Year:   . Arboriculturist in the Last Year:   Transportation Needs:   . Film/video editor (Medical):   Marland Kitchen Lack of Transportation (Non-Medical):   Physical Activity:   . Days of Exercise per Week:   . Minutes of Exercise per Session:   Stress:   . Feeling of Stress :   Social Connections:   . Frequency of Communication with Friends and Family:   . Frequency of Social Gatherings with Friends and Family:   . Attends Religious Services:   . Active Member of Clubs or Organizations:   . Attends Archivist Meetings:   Marland Kitchen Marital Status:      Family History: The patient's ***family history includes Cancer in her father; Gallbladder disease in her father and paternal grandmother; Hypertension in her brother. There is no history of Colon cancer, Colon polyps, Esophageal cancer, or Kidney disease.  ROS:  All other ROS reviewed and negative. Pertinent positives noted in the HPI.     EKGs/Labs/Other Studies Reviewed:   The following studies were personally reviewed by me today:  EKG:  EKG is *** ordered today.  The ekg ordered today demonstrates ***, and was personally reviewed by me.   TTE 07/10/2019 1. Left ventricular ejection fraction, by estimation, is 65 to 70%. The  left ventricle has normal function. The left ventricle has no regional  wall motion abnormalities. Left ventricular diastolic parameters were  normal.  2. Right ventricular systolic function is normal. The right ventricular  size is normal. There is moderately elevated pulmonary artery systolic    pressure.  3. The mitral valve is normal in structure. Trivial mitral valve  regurgitation. No evidence of mitral stenosis.  4. The aortic valve is tricuspid. Aortic valve regurgitation is not  visualized. No aortic stenosis is present.  5. The inferior vena cava is dilated in size with <50% respiratory  variability, suggesting right atrial pressure of 15 mmHg.   Recent Labs: 07/09/2019: ALT 15; B Natriuretic Peptide 24.1; Magnesium 2.4 07/10/2019: BUN 17; Creatinine, Ser 1.13; Potassium 3.4; Sodium 141 07/25/2019: Hemoglobin 11.6; Platelets 177   Recent Lipid Panel No results found for: CHOL, TRIG, HDL, CHOLHDL, VLDL, LDLCALC, LDLDIRECT  Physical Exam:   VS:  There were no vitals taken for this visit.   Wt Readings from Last 3 Encounters:  07/25/19 195 lb (88.5 kg)  07/18/19 200 lb 3.2 oz (90.8 kg)  07/11/19 195 lb (88.5 kg)    General: Well nourished, well developed, in no acute distress Heart: Atraumatic, normal size  Eyes: PEERLA, EOMI  Neck: Supple, no JVD Endocrine: No thryomegaly Cardiac: Normal S1, S2; RRR; no murmurs, rubs, or gallops Lungs: Clear to auscultation bilaterally, no wheezing, rhonchi or rales  Abd: Soft, nontender, no hepatomegaly  Ext: No edema, pulses 2+ Musculoskeletal: No deformities, BUE and BLE strength normal and equal Skin: Warm and dry, no rashes   Neuro: Alert and oriented to person, place, time, and situation, CNII-XII grossly intact, no focal deficits  Psych: Normal mood and affect   ASSESSMENT:   Tamara [name] is a 59 y.o. female who presents for the following: No diagnosis found.  PLAN:   There are no diagnoses linked to this encounter.  Disposition: No follow-ups on file.  Medication Adjustments/Labs and Tests Ordered: Current medicines are reviewed at length with the patient today.  Concerns regarding medicines are outlined above.  No orders of the defined types were placed in this encounter.  No orders of the defined types were  placed in this encounter.   There are no Patient Instructions on file for this visit.   Time Spent with Patient: I have spent a total of *** minutes with patient reviewing hospital notes, telemetry, EKGs, labs and examining the patient as well as establishing an assessment and plan that was discussed with the patient.  > 50% of time was spent in direct patient care.  Signed, Addison Naegeli. Audie Box, [place]  [address] [place], [place] [address] [phone]  07/30/2019 8:31 PM

## 2019-07-31 ENCOUNTER — Ambulatory Visit: Payer: 59 | Admitting: Cardiovascular Disease

## 2019-07-31 NOTE — Telephone Encounter (Signed)
Received fax that [place] ab drawn on 07/25/19 negative, index: 0.16

## 2019-08-05 ENCOUNTER — Telehealth: Payer: Self-pay | Admitting: *Deleted

## 2019-08-05 NOTE — Telephone Encounter (Signed)
Faxed completed/signed Tysabri pt status report and reauth questionnaire to [facility] at [phone]. Received confirmation.   Received fax notification that pt is re-auth for Tysabri via touch program 08/05/19-02/25/20. Account: [facility]. Site auth number: [id number]. Pt enrollment number: [id number].

## 2019-08-11 ENCOUNTER — Other Ambulatory Visit: Payer: Self-pay | Admitting: Neurology

## 2019-08-12 ENCOUNTER — Ambulatory Visit (HOSPITAL_COMMUNITY): Payer: 59

## 2019-08-14 ENCOUNTER — Telehealth: Payer: Self-pay | Admitting: *Deleted

## 2019-08-14 ENCOUNTER — Ambulatory Visit (HOSPITAL_COMMUNITY)
Admission: RE | Admit: 2019-08-14 | Discharge: 2019-08-14 | Disposition: A | Discharge status: Home / Self Care | Payer: 59 | Source: Ambulatory Visit | Attending: Internal Medicine | Admitting: Internal Medicine

## 2019-08-14 DIAGNOSIS — G35 Multiple sclerosis: Secondary | ICD-10-CM | POA: Insufficient documentation

## 2019-08-14 MED ORDER — SODIUM CHLORIDE 0.9 % IV SOLN
INTRAVENOUS | Status: DC | PRN
  Administered 2019-08-14: 250 mL via INTRAVENOUS

## 2019-08-14 MED ORDER — SODIUM CHLORIDE 0.9 % IV SOLN
300.0000 mg | INTRAVENOUS | Status: DC
  Administered 2019-08-14: 300 mg via INTRAVENOUS
  Filled 2019-08-14: qty 15

## 2019-08-14 NOTE — Progress Notes (Signed)
Patient received IV Tysabri as ordered by Arlice Colt, MD. Per [facility]. MD, it was ok to infuse even though patient endorsed muscle spasm, having trouble to walk and balance prior to infusion. Observed for at least 30 minutes post infusion per patient's preference. Didn't want to wait for the entire 60 minutes observation post infusion.Tolerated well, vitals stable, discharge instructions given, verbalized understanding. Patient alert, oriented and ambulatory at the time of discharge.

## 2019-08-14 NOTE — Telephone Encounter (Signed)
Took call from phone staff and spoke with [facility]. States pt is having spasms/having trouble walking/balance issues/hard to get out of bed this am. Wanting to know if they should still infuse her and what else Dr. Felecia Shelling would recommend. Per Dr. Felecia Shelling, ok to infuse and would like to get her in next week for an appt. Offered 08/18/19 at 930am with Dr. Felecia Shelling. Pt accepted over the phone, aware to check in by 900am. They will go ahead with infusion, nothing further needed.

## 2019-08-14 NOTE — Discharge Instructions (Signed)
Natalizumab injection What is this medicine? NATALIZUMAB (na ta LIZ you mab) is used to treat relapsing multiple sclerosis. This drug is not a cure. It is also used to treat Crohn's disease. This medicine may be used for other purposes; ask your health care provider or pharmacist if you have questions. COMMON BRAND NAME(S): Tysabri What should I tell my health care provider before I take this medicine? They need to know if you have any of these conditions:  immune system problems  progressive multifocal leukoencephalopathy (PML)  an unusual or allergic reaction to natalizumab, other medicines, foods, dyes, or preservatives  pregnant or trying to get pregnant  breast-feeding How should I use this medicine? This medicine is for infusion into a vein. It is given by a health care professional in a hospital or clinic setting. A special MedGuide will be given to you by the pharmacist with each prescription and refill. Be sure to read this information carefully each time. Talk to your pediatrician regarding the use of this medicine in children. This medicine is not approved for use in children. Overdosage: If you think you have taken too much of this medicine contact a poison control center or emergency room at once. NOTE: This medicine is only for you. Do not share this medicine with others. What if I miss a dose? It is important not to miss your dose. Call your doctor or health care professional if you are unable to keep an appointment. What may interact with this medicine? Do not take this medicine with any of the following medications:  biologic medicines such as adalimumab, certolizumab, etanercept, golimumab, infliximab This medicine may also interact with the following medications:  azathioprine  cyclosporine  interferons  6-mercaptopurine  methotrexate  other medicines that lower your chance of fighting an infection  steroid medicines like prednisone or  cortisone  vaccines This list may not describe all possible interactions. Give your health care provider a list of all the medicines, herbs, non-prescription drugs, or dietary supplements you use. Also tell them if you smoke, drink alcohol, or use illegal drugs. Some items may interact with your medicine. What should I watch for while using this medicine? Your condition will be monitored carefully while you are receiving this medicine. Visit your doctor for regular check ups. Tell your doctor or healthcare professional if your symptoms do not start to get better or if they get worse. Stay away from people who are sick. Call your doctor or health care professional for advice if you get a fever, chills or sore throat, or other symptoms of a cold or flu. Do not treat yourself. In some patients, this medicine may cause a serious brain infection that may cause death. If you have any problems seeing, thinking, speaking, walking, or standing, tell your doctor right away. If you cannot reach your doctor, get urgent medical care. What side effects may I notice from receiving this medicine? Side effects that you should report to your doctor or health care professional as soon as possible:  allergic reactions like skin rash, itching or hives, swelling of the face, lips, or tongue  breathing problems  changes in vision  chest pain  confusion  depressed mood  dizziness  feeling faint; lightheaded; falls  general ill feeling or flu-like symptoms  loss of memory  missed menstrual periods  muscle weakness  problems with balance, talking, or walking  signs and symptoms of liver injury like dark yellow or brown urine; general ill feeling or flu-like symptoms; light-colored  stools; loss of appetite; nausea; right upper belly pain; unusually weak or tired; yellowing of the eyes or skin  suicidal thoughts, mood changes  unusual bruising or bleeding  unusually weak or tired Side effects that  usually do not require medical attention (report to your doctor or health care professional if they continue or are bothersome):  headache  joint pain  muscle cramps  muscle pain  nausea, vomiting  pain, redness, or irritation at site where injected  tiredness This list may not describe all possible side effects. Call your doctor for medical advice about side effects. You may report side effects to FDA at 1-800-FDA-1088. Where should I keep my medicine? This drug is given in a hospital or clinic and will not be stored at home. NOTE: This sheet is a summary. It may not cover all possible information. If you have questions about this medicine, talk to your doctor, pharmacist, or health care provider.  2020 Elsevier/Gold Standard (2018-07-22 13:20:26)  

## 2019-08-18 ENCOUNTER — Ambulatory Visit (INDEPENDENT_AMBULATORY_CARE_PROVIDER_SITE_OTHER): Payer: 59 | Admitting: Neurology

## 2019-08-18 ENCOUNTER — Encounter: Payer: Self-pay | Admitting: Neurology

## 2019-08-18 ENCOUNTER — Other Ambulatory Visit: Payer: Self-pay

## 2019-08-18 VITALS — BP 118/76 | HR 82 | Ht 63.0 in | Wt 198.5 lb

## 2019-08-18 DIAGNOSIS — G35 Multiple sclerosis: Secondary | ICD-10-CM | POA: Diagnosis not present

## 2019-08-18 DIAGNOSIS — Z79899 Other long term (current) drug therapy: Secondary | ICD-10-CM | POA: Diagnosis not present

## 2019-08-18 DIAGNOSIS — R269 Unspecified abnormalities of gait and mobility: Secondary | ICD-10-CM

## 2019-08-18 DIAGNOSIS — R252 Cramp and spasm: Secondary | ICD-10-CM

## 2019-08-18 DIAGNOSIS — R200 Anesthesia of skin: Secondary | ICD-10-CM

## 2019-08-18 DIAGNOSIS — R29898 Other symptoms and signs involving the musculoskeletal system: Secondary | ICD-10-CM

## 2019-08-18 MED ORDER — BACLOFEN 10 MG PO TABS
10.0000 mg | ORAL_TABLET | Freq: Three times a day (TID) | ORAL | 3 refills | Status: DC | PRN
Start: 2019-08-18 — End: 2019-12-18

## 2019-08-18 MED ORDER — BACLOFEN 10 MG PO TABS: 10.0000 mg | ORAL_TABLET | Freq: Three times a day (TID) | ORAL | 0 refills | Status: DC

## 2019-08-18 NOTE — Progress Notes (Signed)
[facility]  PATIENT: Tamara [name] DOB: 1960/03/22  REFERRING DOCTOR OR PCP:  Tamara [name] SOURCE: Patient, notes from emergency room and recent hospital stay ([facility]), imaging and lab reports, MRI images on PACS.  _________________________________   HISTORICAL  CHIEF COMPLAINT:  Chief Complaint  Patient presents with  . Follow-up    RM 12, alone. Last seen 07/25/2019. She is having more balance/gait issues and spasms.  . Multiple Sclerosis    On Tysabri. Last infusion: 08/14/19. Next: 09/11/19.     HISTORY OF PRESENT ILLNESS:  Tamara [name] Is a 59 y.o. woman with relapsing remitting multiple sclerosis in May 2018 after she presented with transverse myelitis.   Update 08/18/2019: She is on Tysabri she tolerated well and there has been no definite exacerbations.  Last infusion 08/14/19. JCV Ab 07/25/2019 was 0.16 negative.  She denies any exacerbations.     She feels balance and gait are worse.   She notes she does worse in general the week before her infusions.   She sleeps worse the last week of each cycle and generally feels more tired with more neurologic symptoms at that time.  At the last infusion Tamara [name]) she was noted to have more muscle spasms and this appointment was set up to further discuss..     She has dysesthesias in her hands, helped by wearing gloves (thick beaded green nitrile gloves).   She takes tramadol pain is worse.     She could not tolerate gabapentin, Lyrica, lamotrigine and oxcarbazepine.   She had ankle swelling with nortriptyline.  NCV/EMG was fine so probably from spinal cord plaques.     She had MRIs of the brain and cervical spine 07/16/2019 and I compared to the 2019 brain and 2018 cervical spine .  There are no new lesions. She has old cervical spine lesions at C3 and C5.  The brain shows multiple chronic foci in the hemispheres.  None in brainstem or cerebellum.  No focus enhances.  Additionally, there was no evidence of PML.  She is  on topiramate for headaches and phentermine as a stimulant with benefit.     She has had a vit D deficiency and takes D3  Update 07/25/2019: She is on Tysabri and tolerates it well.  She denies any exacerbations.   Gait, strength and sensation are stable.    She has dysesthesias.   She takes tramadol pain is worse.     She could not tolerate gabapentin, Lyrica, lamotrigine and oxcarbazepine.   She had ankle swelling with nortriptyline.  NCV/EMG was fine.    I reviewed her MRi's from 07/2019 and compared to the 2019 brain and 2018 cervical spine .  There are no new lesions. She has old cervical spine lesions at C3 and C5.  The brain shows multiple chronic foci in the hemispheres.  None in brainstem or cerebellum.  No focus enhances.    She is on topiramate for headaches with benefit .   She is on phentermine as a stimulant with benefit.    She has had a vit D deficiency and takes D3  Update 01/14/2019: She has reported more hand pain with an electric shock quality.  NCV/EMG study did not show any evidence of carpal tunnel syndrome, ulnar neuropathy or radiculopathy.  She feels her MS has been stable and she notes no recent change in her gait, strength or sensation.  She was unable to tolerate gabapentin, Lyrica, lamotrigine, nortriptyline or oxcarbazepine.  She was never on  any of these medications [name] enough to know where the not there was much benefit.  Her MS has been stable on Tysabri.  She tolerates it well.  There were no new lesions on her 2019 MRI.  Update 12/04/2018: She is on Tysabri and has tolerated it well.   Her last infusion was 3 weeks ago and next one is 12/11/18.   Her last MRI was 02/21/2017 and showed no new lesions.    She is having a lot of hand pain with left a little more than right.   She feels a Scientist, water quality.     She feels the MS is about the same    She is walking ok with legs dragging some    She has not had any falls.  She has some urinary frequency.   She  feels dizzy if she look up    She has lost weight on the combination of phentermine with topiramate.     She felt more depressed the month she was off the Tysabri.       She takes tramadol when pain is worse.     She could not tolerate gabapentin, Lyrica, lamotrigine and oxcarbazepine.   She had ankle swelling with nortriptyline.   Her ankle swelling is better with HCTZ.     Update 06/03/2018: She feels her MS is stable and notes no new exacerbation.   She tolerates Tysabri well.   She was JCV b negative when last tested 12/27/18.  Gait, balance and strength are the same  She notes a static electric charge senation in her hands.    SHe has some toingling in her hands and also notes an electic shock sensation.     She notes swelling in her ankles since starting nortriptyline late last year.    She notes pitting with finger impressions.   She would note some left foot swelling since an injury years ago.   She notes a compression stocking helps.   She notes milder swelling in her hands.      She has gained about 20 pounds over the last year.    She denies shortness of breath and no wheezes.   We discussed weight loss.  She has not lost any weight on phentermine.  We discussed adding Topamax phentermine as that may help her lose weight.  Update 12/26/2017: She is on Tysabri and she tolerates it well.  Her last JCV Ab was negative 02/28/2017.     She was stable until this week.   She is having more weakness in her left leg over the past 2 days.     She notes the leg is low as she walks and would drag if she didn't lift up higher.    She gets an electric sensation in her hands like static electric discharges.     She gets some dysesthesias in her left leg but they are milder.    She is trying to do bladder exercises to help with urgency and she notes some benefit.     She notes a lot of fatigue.    She also has obesity and is on phentermine.   She notes improvement in focus/attnetion and fatigue with the  phentermine but has not lost weight.     Update 02/13/2017: She is on Tysabri and feels her MS is stable.   She does note that joint aches for 1-3 days after each infusion.   Sometimes she feels a little more tired for a  week after the infusions.  She feels gait and balance are stable.  She still has a lot of dysesthesia.  She has some urinary urgency.   No incontinence.   She notes some blurry vision and black dots and night vision is worse.   Her MRI 05/2016 showed an enhancing focus at C3 and nonenhancing focus at C5 (and foci in brain nonenhancing)  She sees Dr. Otelia Sergeant for knees and shoulder pain.  He is trying to get her an exercise machine as she only gets 30 PT visits a year.     She is on Tylenol and tramadol for the pain.     She has fatigue helped by phentermine.   She has had trouble losing weight.  Phentermine has not helped weight loss though fatigue and wakefulness and apathy are much better.     From 10/11/2016:  MS:   She started Tysabri therapy July 2018 and she tolerates it well. The JCV antibody was negative (0.16) on 07/04/2016.     Gait/strength:  Her gait and balance improved after the second course of  IV Steroids and she feels she is still doing well.  The truncal dysestheias and leg heaviness also improved,   She still notes electric sensations in both hands.   Her hands also seem mildly weak and clumsy.  Her hand dysesthesias are very uncomfortable and limit some of her activities.  She has trouble holding heavy pots.   Baclofen was stopped due to HA and swelling.    Numbness/dysesthesia:   Initially, she had a lot of numbness below the waist and into the groin. That has improved. She continues to have numbness in the hands and she gets an uncomfortable shocklike dysesthesias   Gabapentin and Lyrica were not tolerated in the past.    Tegretol caused itching and Trileptal caused mild swelling in her mouth?  Bladder/bowel:  Bladder function is doing well. She has some urinary  frequency and urgency but is not having any incontinence recently.   Vision:  She denied any major change in vision or diplopia.       Colors are normal and symmetric.     Fatigue/sleep:  She notes some fatigue, physical > cognitive.    The combination of being on Tysabri and the higher dose of phentermine have helped.   She is trying to exercise more.   The dysesthesias in her hands wake her up and sleep is poor many nights.    She notes fatigue and sleepiness are much better if she takes a nap, though then night time sleep is worse.  Mood/Cognition:  Depression and anxiety are better now that she is on a DMT and more accepting of the diagnosis.   Cognition is fine.  MS History:   On 06/10/16, she had the onset of numbness and clumsiness in her legs.   As the day went on, her hands also became numb and clumsy.  She felt numb from her waist down.    She went to the [facility] and had an xtay of her back and was referred to orthopedics     Two days later, she went to the ED when her symptoms worsened and she was seen and discharged.    She saw her internist who ordered a lumbar MRI.  The lumbar MRI showed degenerative changes that were mild at L4-L5 and the study was otherwise fairly normal.   She was referred to the ED and had an MRI of the  brain, cervical spine and thoracic spine.    She was found to have a transverse myelitis in the cervical spine with an enhancing lesion in the posterior columns. There was also a smaller non-enhancing focus at C5   She also had 5-6 spots in her brain, the largest being periventricular on the right.  She was admitted to [facility] and had 3 days of IV Solumedrol followed by an oral taper.    She was transferred to rehabilitation and was discharged 5/30.   She walked out and felt much better.   Additionally, while she was in the hospital (06/15/2016) she had a lumbar puncture. The CSF was abnormal showing showing the presence  4 oligoclonal bands and an  elevated IgG index of 0.9 (less than 0.7 is normal). The myelin basic protein was mildly elevated at 2.2.      He had another exacerbation after returning home with left leg dragging and feeling more off balance with stumbling.   In retrospect, in 2017, she had an episode lasting a few weeks where she felt she was dragging her left leg some. This completely resolved and she did not have any other symptoms such as numbness at that time.   MRIs of the brain, cervical spine, thoracic spine and lumbar spine were personally reviewed. The MRI of the cervical spine shows an enhancing lesion posterior spinal cord adjacent to C2-C3. Additionally there is a nonenhancing focus adjacent to C5. In the brain, there are several T2/FLAIR hyperintense foci and the largest is in the right parietal lobe and there are 4 or 5 other small foci, one in the periventricular white matter of the right frontal lobe and the rest in the subcortical white matter.    Laboratory studies show 4 oligoclonal bands and an elevated IgG index of 0.9. Myelin basic protein was mildly elevated at 2.2. She had elevated glucose between 120 and 140 several times while getting steroids.   Her Vit D was mildly low (25.6) and she just started OTC supplementation.   She started Tysabri in July 2018   REVIEW OF SYSTEMS: Constitutional: No fevers, chills, sweats, or change in appetite Eyes: No visual changes, double vision, eye pain Ear, nose and throat: No hearing loss, ear pain, nasal congestion, sore throat Cardiovascular: No chest pain, palpitations Respiratory: No shortness of breath at rest or with exertion.   No wheezes GastrointestinaI: No nausea, vomiting, diarrhea, abdominal pain, fecal incontinence Genitourinary: No dysuria, urinary retention or frequency.  No nocturia. Musculoskeletal: No neck pain, back pain Integumentary: No rash, pruritus, skin lesions Neurological: as above Psychiatric: No depression at this time.  No  anxiety Endocrine: No palpitations, diaphoresis, change in appetite, change in weigh or increased thirst Hematologic/Lymphatic: No anemia, purpura, petechiae. Allergic/Immunologic: No itchy/runny eyes, nasal congestion, recent allergic reactions, rashes  ALLERGIES: Allergies  Allergen Reactions  . Codeine Other (See Comments)    Delusions  . Dilaudid [Hydromorphone Hcl] Swelling and Other (See Comments)    Tongue swells   . Ditropan [Oxybutynin] Other (See Comments)    Burning sensation  . Other Swelling and Other (See Comments)    Unnamed gel or antiseptic solution- Was applied to IV site with a needle- Turned the skin "black and blue" that remained (caused burning and phlebitis, also)  . Paxil [Paroxetine Hcl] Other (See Comments)    Hallucinations and heavy periods   . Tegretol [Carbamazepine] Itching  . Trileptal [Oxcarbazepine] Swelling  . Adhesive [Tape] Hives, Itching, Rash and Other (See Comments)  PAPER TAPE  . Augmentin [Amoxicillin-Pot Clavulanate] Rash  . Gabapentin Rash and Other (See Comments)    Hallucinations and depression, also    HOME MEDICATIONS:  Current Outpatient Medications:  .  acetaminophen (TYLENOL) 650 MG CR tablet, Take 1,300 mg by mouth in the morning, at noon, and at bedtime., Disp: , Rfl:  .  ALPRAZolam (XANAX) 1 MG tablet, Take 1-2 tablets thirty minutes prior to MRI.  May take one additional tablet before entering scanner, if needed.  MUST HAVE DRIVER., Disp: 3 tablet, Rfl: 0 .  Cholecalciferol (VITAMIN D-3) 25 MCG (1000 UT) CAPS, Take 2,000 Units by mouth daily., Disp: , Rfl:  .  docusate sodium (COLACE) 100 MG capsule, Take 1 capsule (100 mg total) by mouth daily. (Patient taking differently: Take 100 mg by mouth daily as needed for mild constipation. ), Disp: 10 capsule, Rfl: 0 .  fluticasone (FLONASE) 50 MCG/ACT nasal spray, Place 2 sprays into both nostrils daily. (Patient taking differently: Place 2 sprays into both nostrils daily as  needed for allergies or rhinitis. ), Disp: 16 g, Rfl: 2 .  loratadine (CLARITIN) 10 MG tablet, Take 10 mg by mouth daily as needed for allergies or rhinitis., Disp: , Rfl:  .  Multiple Vitamins-Minerals (HAIR/SKIN/NAILS) CAPS, Take 1 capsule by mouth in the morning, at noon, and at bedtime., Disp: , Rfl:  .  Multiple Vitamins-Minerals (ONE-A-DAY WOMENS PO), Take 1 tablet by mouth daily., Disp: , Rfl:  .  natalizumab (TYSABRI) 300 MG/15ML injection, Inject 300 mg into the vein every 30 (thirty) days., Disp: , Rfl:  .  Omega-3 Fatty Acids (FISH OIL) 1000 MG CAPS, Take 1,000-2,000 mg by mouth See admin instructions. Take 2,000 mg by mouth in the morning and 1,000 mg at bedtime, Disp: , Rfl:  .  pantoprazole (PROTONIX) 40 MG tablet, Take 1 tablet (40 mg total) by mouth daily., Disp: 90 tablet, Rfl: 6 .  phentermine 37.5 MG capsule, Take 1 capsule by mouth once daily in the morning, Disp: 30 capsule, Rfl: 5 .  topiramate (TOPAMAX) 50 MG tablet, Take 1 tablet (50 mg total) by mouth 3 (three) times daily., Disp: 90 tablet, Rfl: 5 .  traMADol (ULTRAM) 50 MG tablet, TAKE 1 TABLET BY MOUTH TWICE DAILY AS NEEDED FOR SEVERE PAIN (Patient taking differently: Take 50 mg by mouth 2 (two) times daily as needed for severe pain. ), Disp: 60 tablet, Rfl: 0 .  baclofen (LIORESAL) 10 MG tablet, Take 1 tablet (10 mg total) by mouth 3 (three) times daily as needed for muscle spasms., Disp: 90 each, Rfl: 3  PAST MEDICAL HISTORY: Past Medical History:  Diagnosis Date  . Anemia   . Bowel obstruction (HCC)   . CKD (chronic kidney disease), stage II    based on labs  . GERD (gastroesophageal reflux disease)   . IBS (irritable bowel syndrome)    at age of 42  . Multiple sclerosis (HCC)   . RBBB   . Sciatica 2009  . Vision abnormalities     PAST SURGICAL HISTORY: Past Surgical History:  Procedure Laterality Date  . ABDOMINAL HYSTERECTOMY  2008   cervix and right ovary still intact  . KNEE ARTHROSCOPY  2010 and  2011   Left knee, x2    FAMILY HISTORY: Family History  Problem Relation Age of Onset  . Cancer Father        Gallbladder  . Gallbladder disease Father   . Hypertension Brother   . Gallbladder disease Paternal Grandmother   .  Colon cancer Neg Hx   . Colon polyps Neg Hx   . Esophageal cancer Neg Hx   . Kidney disease Neg Hx     SOCIAL HISTORY:  Social History   Socioeconomic History  . Marital status: Married    Spouse name: Not on file  . Number of children: 2  . Years of education: Not on file  . Highest education level: Not on file  Occupational History  . Occupation: Science writer  Tobacco Use  . Smoking status: Never Smoker  . Smokeless tobacco: Never Used  Substance and Sexual Activity  . Alcohol use: No    Alcohol/week: 0.0 standard drinks  . Drug use: No  . Sexual activity: Not on file  Other Topics Concern  . Not on file  Social History Narrative  . Not on file   Social Determinants of Health   Financial Resource Strain:   . Difficulty of Paying Living Expenses:   Food Insecurity:   . Worried About Programme researcher, broadcasting/film/video in the Last Year:   . Barista in the Last Year:   Transportation Needs:   . Freight forwarder (Medical):   Tamara [name] Kitchen Lack of Transportation (Non-Medical):   Physical Activity:   . Days of Exercise per Week:   . Minutes of Exercise per Session:   Stress:   . Feeling of Stress :   Social Connections:   . Frequency of Communication with Friends and Family:   . Frequency of Social Gatherings with Friends and Family:   . Attends Religious Services:   . Active Member of Clubs or Organizations:   . Attends Banker Meetings:   Tamara [name] Kitchen Marital Status:   Intimate Partner Violence:   . Fear of Current or Ex-Partner:   . Emotionally Abused:   Tamara [name] Kitchen Physically Abused:   . Sexually Abused:      PHYSICAL EXAM  Vitals:   08/18/19 0944  BP: 118/76  Pulse: 82  Weight: 198 lb 8 oz (90 kg)  Height: 5\' 3"  (1.6 m)     Body mass index is 35.16 kg/m.   General: The patient is well-developed and well-nourished and in no acute distress   Neurologic Exam  Mental status: The patient is alert and oriented x 3 at the time of the examination. The patient has apparent normal recent and remote memory, with an apparently normal attention span and concentration ability.   Speech is normal.  Cranial nerves: Extraocular movements are full. Facial strength and sensation is normal. Trapezius strength is normal.. Hearing appears to be symmetric.  Motor:  Muscle bulk is normal.   Muscle tone is normal. Strength is 5/5 except 4+/5 hip flexion (iliopsoas).      Sensory: She appeared to have normal and symmetric sensation in the hands.  She did have a Tinel's sign on the left at the wrist over the median nerve.  Coordination: Cerebellar testing reveals good finger-nose-finger and normal heel-to-shin bilaterally.  Gait and station: Station is normal.   Gait is mildly wide.  Tandem gait is poor.  She has a left foot drop.  Exam is similar to last visit.  There is a mild left foot drop.   She does not have a Romberg sign.  Reflexes:  . Deep tendon reflexes are 3 and symmetric in the knees and ankles and 2 and symmetric in the arms.      DIAGNOSTIC DATA (LABS, IMAGING, TESTING) - I reviewed patient records, labs, notes, testing and imaging  myself where available.  Lab Results  Component Value Date   WBC 6.9 07/25/2019   HGB 11.6 07/25/2019   HCT 36.3 07/25/2019   MCV 88 07/25/2019   PLT 177 07/25/2019      Component Value Date/Time   NA 141 07/10/2019 0519   NA 144 10/22/2018 1658   K 3.4 (L) 07/10/2019 0519   CL 112 (H) 07/10/2019 0519   CO2 23 07/10/2019 0519   GLUCOSE 103 (H) 07/10/2019 0519   BUN 17 07/10/2019 0519   BUN 13 10/22/2018 1658   CREATININE 1.13 (H) 07/10/2019 0519   CREATININE 1.01 07/08/2013 1440   CALCIUM 8.6 (L) 07/10/2019 0519   PROT 6.2 (L) 07/09/2019 1956   PROT 6.4 10/22/2018  1658   ALBUMIN 3.8 07/09/2019 1956   ALBUMIN 4.4 10/22/2018 1658   AST 16 07/09/2019 1956   ALT 15 07/09/2019 1956   ALKPHOS 59 07/09/2019 1956   BILITOT 0.5 07/09/2019 1956   BILITOT <0.2 10/22/2018 1658   GFRNONAA 54 (L) 07/10/2019 0519   GFRNONAA 64 07/08/2013 1440   GFRAA >60 07/10/2019 0519   GFRAA 74 07/08/2013 1440   No results found for: CHOL, HDL, LDLCALC, LDLDIRECT, TRIG, CHOLHDL No results found for: ZOXW9U Lab Results  Component Value Date   VITAMINB12 2,209 (H) 06/15/2016   Lab Results  Component Value Date   TSH 0.351 06/15/2016       ASSESSMENT AND PLAN  Multiple sclerosis (HCC)  High risk medication use  Gait disturbance  Hand numbness  Left leg weakness  Spasticity   1.   Continue Tysabri.. Some patients do seem to have more symptoms the last week of each 4-week cycle for Tysabri.  I do not believe this if he had an exacerbation.  An MRI couple weeks earlier did not show any acute findings and no change compared MRIs from a couple years previously. 2.   She will continue her medications for the MS symptoms 3.    Stay active and exercise.     4.    RTC 6  months.   She is advised to call sooner if she has any new or worsening neurologic symptoms.   Kynan Peasley A. Epimenio Foot, MD, PhD, [place]  08/18/2019, 12:48 PM Certified in Neurology, Clinical Neurophysiology, Sleep Medicine, Pain Medicine and Neuroimaging Dir., [facility]  [facility] [address] [place], [place] [address] [phone]

## 2019-08-27 ENCOUNTER — Other Ambulatory Visit: Payer: Self-pay | Admitting: Neurology

## 2019-09-11 ENCOUNTER — Ambulatory Visit (HOSPITAL_COMMUNITY)
Admission: RE | Admit: 2019-09-11 | Discharge: 2019-09-11 | Disposition: A | Discharge status: Home / Self Care | Payer: 59 | Source: Ambulatory Visit | Attending: Internal Medicine | Admitting: Internal Medicine

## 2019-09-11 DIAGNOSIS — G35 Multiple sclerosis: Secondary | ICD-10-CM | POA: Diagnosis present

## 2019-09-11 MED ORDER — ACETAMINOPHEN 325 MG PO TABS: 650.0000 mg | ORAL_TABLET | ORAL | Status: DC | PRN

## 2019-09-11 MED ORDER — DIPHENHYDRAMINE HCL 25 MG PO CAPS: 25.0000 mg | ORAL_CAPSULE | ORAL | Status: DC | PRN

## 2019-09-11 MED ORDER — SODIUM CHLORIDE 0.9 % IV SOLN
300.0000 mg | INTRAVENOUS | Status: DC
  Administered 2019-09-11: 300 mg via INTRAVENOUS
  Filled 2019-09-11: qty 15

## 2019-09-11 MED ORDER — SODIUM CHLORIDE 0.9 % IV SOLN
INTRAVENOUS | Status: DC | PRN
  Administered 2019-09-11: 250 mL via INTRAVENOUS

## 2019-09-11 NOTE — Discharge Instructions (Signed)
Natalizumab injection What is this medicine? NATALIZUMAB (na ta LIZ you mab) is used to treat relapsing multiple sclerosis. This drug is not a cure. It is also used to treat Crohn's disease. This medicine may be used for other purposes; ask your health care provider or pharmacist if you have questions. COMMON BRAND NAME(S): Tysabri What should I tell my health care provider before I take this medicine? They need to know if you have any of these conditions:  immune system problems  progressive multifocal leukoencephalopathy (PML)  an unusual or allergic reaction to natalizumab, other medicines, foods, dyes, or preservatives  pregnant or trying to get pregnant  breast-feeding How should I use this medicine? This medicine is for infusion into a vein. It is given by a health care professional in a hospital or clinic setting. A special MedGuide will be given to you by the pharmacist with each prescription and refill. Be sure to read this information carefully each time. Talk to your pediatrician regarding the use of this medicine in children. This medicine is not approved for use in children. Overdosage: If you think you have taken too much of this medicine contact a poison control center or emergency room at once. NOTE: This medicine is only for you. Do not share this medicine with others. What if I miss a dose? It is important not to miss your dose. Call your doctor or health care professional if you are unable to keep an appointment. What may interact with this medicine? Do not take this medicine with any of the following medications:  biologic medicines such as adalimumab, certolizumab, etanercept, golimumab, infliximab This medicine may also interact with the following medications:  azathioprine  cyclosporine  interferons  6-mercaptopurine  methotrexate  other medicines that lower your chance of fighting an infection  steroid medicines like prednisone or  cortisone  vaccines This list may not describe all possible interactions. Give your health care provider a list of all the medicines, herbs, non-prescription drugs, or dietary supplements you use. Also tell them if you smoke, drink alcohol, or use illegal drugs. Some items may interact with your medicine. What should I watch for while using this medicine? Your condition will be monitored carefully while you are receiving this medicine. Visit your doctor for regular check ups. Tell your doctor or healthcare professional if your symptoms do not start to get better or if they get worse. Stay away from people who are sick. Call your doctor or health care professional for advice if you get a fever, chills or sore throat, or other symptoms of a cold or flu. Do not treat yourself. In some patients, this medicine may cause a serious brain infection that may cause death. If you have any problems seeing, thinking, speaking, walking, or standing, tell your doctor right away. If you cannot reach your doctor, get urgent medical care. What side effects may I notice from receiving this medicine? Side effects that you should report to your doctor or health care professional as soon as possible:  allergic reactions like skin rash, itching or hives, swelling of the face, lips, or tongue  breathing problems  changes in vision  chest pain  confusion  depressed mood  dizziness  feeling faint; lightheaded; falls  general ill feeling or flu-like symptoms  loss of memory  missed menstrual periods  muscle weakness  problems with balance, talking, or walking  signs and symptoms of liver injury like dark yellow or brown urine; general ill feeling or flu-like symptoms; light-colored  stools; loss of appetite; nausea; right upper belly pain; unusually weak or tired; yellowing of the eyes or skin  suicidal thoughts, mood changes  unusual bruising or bleeding  unusually weak or tired Side effects that  usually do not require medical attention (report to your doctor or health care professional if they continue or are bothersome):  headache  joint pain  muscle cramps  muscle pain  nausea, vomiting  pain, redness, or irritation at site where injected  tiredness This list may not describe all possible side effects. Call your doctor for medical advice about side effects. You may report side effects to FDA at 1-800-FDA-1088. Where should I keep my medicine? This drug is given in a hospital or clinic and will not be stored at home. NOTE: This sheet is a summary. It may not cover all possible information. If you have questions about this medicine, talk to your doctor, pharmacist, or health care provider.  2020 Elsevier/Gold Standard (2018-07-22 13:20:26)  

## 2019-09-11 NOTE — Progress Notes (Signed)
Patient received IV Tysabri as ordered by Arlice Colt MD. Observed for at least 50 minutes post infusion, declined the full 60 minutes post infusion observation because her "ride" came.Tolerated well, vitals stable, discharge instructions given, verbalized understanding. Patient alert, oriented and ambulatory at the time of discharge.

## 2019-09-14 ENCOUNTER — Other Ambulatory Visit: Payer: Self-pay | Admitting: Neurology

## 2019-10-14 ENCOUNTER — Telehealth: Payer: Self-pay | Admitting: Neurology

## 2019-10-14 ENCOUNTER — Encounter: Payer: Self-pay | Admitting: Internal Medicine

## 2019-10-14 ENCOUNTER — Other Ambulatory Visit: Payer: Self-pay

## 2019-10-14 ENCOUNTER — Ambulatory Visit (INDEPENDENT_AMBULATORY_CARE_PROVIDER_SITE_OTHER): Payer: 59 | Admitting: Internal Medicine

## 2019-10-14 DIAGNOSIS — J069 Acute upper respiratory infection, unspecified: Secondary | ICD-10-CM

## 2019-10-14 DIAGNOSIS — J309 Allergic rhinitis, unspecified: Secondary | ICD-10-CM

## 2019-10-14 MED ORDER — FLUTICASONE PROPIONATE 50 MCG/ACT NA SUSP: 2.0000 | Freq: Every day | NASAL | 2 refills | Status: DC

## 2019-10-14 NOTE — Telephone Encounter (Signed)
Pt called  having a Tysabri treatment tomorrow at 8:30a. Pt ask if ok to take Tysabri treatment, having nasal congestion. Would like a call from the nurse.

## 2019-10-14 NOTE — Patient Instructions (Addendum)
To Tamara [name],  It was a pleasure seeing you today! Today we discussed your coughing symptoms. Based on physical examination, you are likely experiencing an allergy flare. I will prescribe you Flonase today, please follow the direction on your medication (2 sprays per nostril). Have a good day!  Sincerely,  Maudie Mercury, MD

## 2019-10-14 NOTE — Telephone Encounter (Signed)
Called pt back. She has had nasal congestion x4 days. no fever, denies being around anyone with covid. Has not been tested herself. Has been taking robutussin and nasonex. Granddaughter has had similar sx but tested twice for covid and negative. She has cough sometimes she said. Per Dr. Felecia Shelling, she can go for Tysabri infusion tomorrow. She will make sure to wear a mask.

## 2019-10-14 NOTE — Progress Notes (Signed)
   CC: Nasal Congestion  HPI:  [name] is a 59 y.o. female, with a PMH noted below, who presents to the clinic for nasal congestion. To see the management of her acute and chronic conditions, please see the a&p note under the encounters tab.   Past Medical History:  Diagnosis Date  . Anemia   . Bowel obstruction ([place])   . CKD (chronic kidney disease), stage II    based on labs  . GERD (gastroesophageal reflux disease)   . IBS (irritable bowel syndrome)    at age of 9  . Multiple sclerosis ([place])   . RBBB   . Sciatica 2009  . Vision abnormalities    Review of Systems:   Review of Systems  Constitutional: Negative for chills, diaphoresis, fever, malaise/fatigue and weight loss.  HENT: Positive for congestion. Negative for hearing loss, sinus pain, sore throat and tinnitus.   Respiratory: Positive for cough and shortness of breath. Negative for hemoptysis, sputum production, wheezing and stridor.   Cardiovascular: Negative for chest pain and palpitations.  Gastrointestinal: Negative for abdominal pain, constipation, diarrhea, nausea and vomiting.    Physical Exam:  Vitals:   10/14/19 1601  BP: 117/61  Pulse: 84  Temp: 98.2 F (36.8 C)  TempSrc: Oral  SpO2: 100%  Weight: 206 lb (93.4 kg)   Physical Exam Constitutional:      Appearance: She is obese.  HENT:     Head: Normocephalic and atraumatic.     Right Ear: External ear normal. There is no impacted cerumen.     Left Ear: External ear normal. There is no impacted cerumen.     Ears:     Comments: Clear fluid noted bilaterally on examination of the TM, scarring of the TM on R. No erythema, purulence noted. Minimum cerumen production bilaterally.    Mouth/Throat:     Mouth: Mucous membranes are moist.     Pharynx: Oropharynx is clear. No oropharyngeal exudate or posterior oropharyngeal erythema.     Comments: Mild cobblestoning. Eyes:     General:        Right eye: No discharge.        Left eye: No  discharge.     Conjunctiva/sclera: Conjunctivae normal.  Cardiovascular:     Rate and Rhythm: Normal rate and regular rhythm.     Pulses: Normal pulses.     Heart sounds: Normal heart sounds. No murmur heard.  No friction rub. No gallop.   Pulmonary:     Effort: Pulmonary effort is normal.     Breath sounds: Normal breath sounds. No wheezing, rhonchi or rales.  Abdominal:     General: Abdomen is flat. Bowel sounds are normal.     Tenderness: There is no abdominal tenderness.  Neurological:     Mental Status: She is alert.      Assessment & Plan:   See Encounters Tab for problem based charting.  Patient discussed with Dr. Daryll Drown

## 2019-10-15 ENCOUNTER — Ambulatory Visit (HOSPITAL_COMMUNITY)
Admission: RE | Admit: 2019-10-15 | Discharge: 2019-10-15 | Disposition: A | Discharge status: Home / Self Care | Payer: 59 | Source: Ambulatory Visit | Attending: Internal Medicine | Admitting: Internal Medicine

## 2019-10-15 ENCOUNTER — Encounter: Payer: Self-pay | Admitting: Internal Medicine

## 2019-10-15 DIAGNOSIS — G35 Multiple sclerosis: Secondary | ICD-10-CM | POA: Diagnosis not present

## 2019-10-15 MED ORDER — DIPHENHYDRAMINE HCL 25 MG PO CAPS: 25.0000 mg | ORAL_CAPSULE | ORAL | Status: DC | PRN

## 2019-10-15 MED ORDER — ACETAMINOPHEN 325 MG PO TABS: 650.0000 mg | ORAL_TABLET | ORAL | Status: DC | PRN

## 2019-10-15 MED ORDER — SODIUM CHLORIDE 0.9 % IV SOLN
INTRAVENOUS | Status: DC | PRN
  Administered 2019-10-15: 250 mL via INTRAVENOUS

## 2019-10-15 MED ORDER — SODIUM CHLORIDE 0.9 % IV SOLN
300.0000 mg | INTRAVENOUS | Status: DC
  Administered 2019-10-15: 300 mg via INTRAVENOUS
  Filled 2019-10-15: qty 15

## 2019-10-15 NOTE — Assessment & Plan Note (Signed)
Patient presents with nasal congestion, cough, and ear fullness. Clear fluid noted behind the TM, cobblestoning, and mildly boggy turbinates on physical examination, points towards her seasonal allergies, which patient confides worsen towards Fall and Winter. Likely, her symptoms are coming from allergic rhinitis.  - Flonase twice in each nostril, twice daily.

## 2019-10-15 NOTE — Progress Notes (Signed)
Patient received IV Tysabri as ordered by Arlice Colt MD. Observed for at least 40 minutes post infusion, declined the full 60 minutes post infusion observation because her "ride" came.Tolerated well, vitals stable, discharge instructions given, verbalized understanding. Patient alert, oriented and ambulatory at the time of discharge.

## 2019-10-15 NOTE — Discharge Instructions (Signed)
Natalizumab injection What is this medicine? NATALIZUMAB (na ta LIZ you mab) is used to treat relapsing multiple sclerosis. This drug is not a cure. It is also used to treat Crohn's disease. This medicine may be used for other purposes; ask your health care provider or pharmacist if you have questions. COMMON BRAND NAME(S): Tysabri What should I tell my health care provider before I take this medicine? They need to know if you have any of these conditions:  immune system problems  progressive multifocal leukoencephalopathy (PML)  an unusual or allergic reaction to natalizumab, other medicines, foods, dyes, or preservatives  pregnant or trying to get pregnant  breast-feeding How should I use this medicine? This medicine is for infusion into a vein. It is given by a health care professional in a hospital or clinic setting. A special MedGuide will be given to you by the pharmacist with each prescription and refill. Be sure to read this information carefully each time. Talk to your pediatrician regarding the use of this medicine in children. This medicine is not approved for use in children. Overdosage: If you think you have taken too much of this medicine contact a poison control center or emergency room at once. NOTE: This medicine is only for you. Do not share this medicine with others. What if I miss a dose? It is important not to miss your dose. Call your doctor or health care professional if you are unable to keep an appointment. What may interact with this medicine? Do not take this medicine with any of the following medications:  biologic medicines such as adalimumab, certolizumab, etanercept, golimumab, infliximab This medicine may also interact with the following medications:  azathioprine  cyclosporine  interferons  6-mercaptopurine  methotrexate  other medicines that lower your chance of fighting an infection  steroid medicines like prednisone or  cortisone  vaccines This list may not describe all possible interactions. Give your health care provider a list of all the medicines, herbs, non-prescription drugs, or dietary supplements you use. Also tell them if you smoke, drink alcohol, or use illegal drugs. Some items may interact with your medicine. What should I watch for while using this medicine? Your condition will be monitored carefully while you are receiving this medicine. Visit your doctor for regular check ups. Tell your doctor or healthcare professional if your symptoms do not start to get better or if they get worse. Stay away from people who are sick. Call your doctor or health care professional for advice if you get a fever, chills or sore throat, or other symptoms of a cold or flu. Do not treat yourself. In some patients, this medicine may cause a serious brain infection that may cause death. If you have any problems seeing, thinking, speaking, walking, or standing, tell your doctor right away. If you cannot reach your doctor, get urgent medical care. What side effects may I notice from receiving this medicine? Side effects that you should report to your doctor or health care professional as soon as possible:  allergic reactions like skin rash, itching or hives, swelling of the face, lips, or tongue  breathing problems  changes in vision  chest pain  confusion  depressed mood  dizziness  feeling faint; lightheaded; falls  general ill feeling or flu-like symptoms  loss of memory  missed menstrual periods  muscle weakness  problems with balance, talking, or walking  signs and symptoms of liver injury like dark yellow or brown urine; general ill feeling or flu-like symptoms; light-colored  stools; loss of appetite; nausea; right upper belly pain; unusually weak or tired; yellowing of the eyes or skin  suicidal thoughts, mood changes  unusual bruising or bleeding  unusually weak or tired Side effects that  usually do not require medical attention (report to your doctor or health care professional if they continue or are bothersome):  headache  joint pain  muscle cramps  muscle pain  nausea, vomiting  pain, redness, or irritation at site where injected  tiredness This list may not describe all possible side effects. Call your doctor for medical advice about side effects. You may report side effects to FDA at 1-800-FDA-1088. Where should I keep my medicine? This drug is given in a hospital or clinic and will not be stored at home. NOTE: This sheet is a summary. It may not cover all possible information. If you have questions about this medicine, talk to your doctor, pharmacist, or health care provider.  2020 Elsevier/Gold Standard (2018-07-22 13:20:26)  

## 2019-10-16 NOTE — Progress Notes (Signed)
Internal Medicine Clinic Attending ? ?Case discussed with Dr. Winters  At the time of the visit.  We reviewed the resident?s history and exam and pertinent patient test results.  I agree with the assessment, diagnosis, and plan of care documented in the resident?s note.  ?

## 2019-11-28 ENCOUNTER — Encounter (HOSPITAL_COMMUNITY): Payer: 59

## 2019-12-01 ENCOUNTER — Other Ambulatory Visit: Payer: Self-pay

## 2019-12-01 ENCOUNTER — Encounter (HOSPITAL_COMMUNITY): Payer: 59

## 2019-12-01 ENCOUNTER — Ambulatory Visit (HOSPITAL_COMMUNITY)
Admission: RE | Admit: 2019-12-01 | Discharge: 2019-12-01 | Disposition: A | Discharge status: Home / Self Care | Payer: 59 | Source: Ambulatory Visit | Attending: Internal Medicine | Admitting: Internal Medicine

## 2019-12-01 DIAGNOSIS — G35 Multiple sclerosis: Secondary | ICD-10-CM | POA: Insufficient documentation

## 2019-12-01 MED ORDER — SODIUM CHLORIDE 0.9 % IV SOLN
INTRAVENOUS | Status: DC | PRN
  Administered 2019-12-01: 250 mL via INTRAVENOUS

## 2019-12-01 MED ORDER — SODIUM CHLORIDE 0.9 % IV SOLN
300.0000 mg | INTRAVENOUS | Status: DC
  Administered 2019-12-01: 300 mg via INTRAVENOUS
  Filled 2019-12-01: qty 15

## 2019-12-01 NOTE — Progress Notes (Signed)
Patient received IVTysabri as ordered by Arlice Colt MD. Observed for at least60 minutes post infusion. Tolerated well, vitals stable, discharge instructions given, verbalized understanding. Patient alert, oriented and ambulatory at the time of discharge.

## 2019-12-01 NOTE — Discharge Instructions (Signed)
Natalizumab injection What is this medicine? NATALIZUMAB (na ta LIZ you mab) is used to treat relapsing multiple sclerosis. This drug is not a cure. It is also used to treat Crohn's disease. This medicine may be used for other purposes; ask your health care provider or pharmacist if you have questions. COMMON BRAND NAME(S): Tysabri What should I tell my health care provider before I take this medicine? They need to know if you have any of these conditions:  immune system problems  progressive multifocal leukoencephalopathy (PML)  an unusual or allergic reaction to natalizumab, other medicines, foods, dyes, or preservatives  pregnant or trying to get pregnant  breast-feeding How should I use this medicine? This medicine is for infusion into a vein. It is given by a health care professional in a hospital or clinic setting. A special MedGuide will be given to you by the pharmacist with each prescription and refill. Be sure to read this information carefully each time. Talk to your pediatrician regarding the use of this medicine in children. This medicine is not approved for use in children. Overdosage: If you think you have taken too much of this medicine contact a poison control center or emergency room at once. NOTE: This medicine is only for you. Do not share this medicine with others. What if I miss a dose? It is important not to miss your dose. Call your doctor or health care professional if you are unable to keep an appointment. What may interact with this medicine? Do not take this medicine with any of the following medications:  biologic medicines such as adalimumab, certolizumab, etanercept, golimumab, infliximab This medicine may also interact with the following medications:  azathioprine  cyclosporine  interferons  6-mercaptopurine  methotrexate  other medicines that lower your chance of fighting an infection  steroid medicines like prednisone or  cortisone  vaccines This list may not describe all possible interactions. Give your health care provider a list of all the medicines, herbs, non-prescription drugs, or dietary supplements you use. Also tell them if you smoke, drink alcohol, or use illegal drugs. Some items may interact with your medicine. What should I watch for while using this medicine? Your condition will be monitored carefully while you are receiving this medicine. Visit your doctor for regular check ups. Tell your doctor or healthcare professional if your symptoms do not start to get better or if they get worse. Stay away from people who are sick. Call your doctor or health care professional for advice if you get a fever, chills or sore throat, or other symptoms of a cold or flu. Do not treat yourself. In some patients, this medicine may cause a serious brain infection that may cause death. If you have any problems seeing, thinking, speaking, walking, or standing, tell your doctor right away. If you cannot reach your doctor, get urgent medical care. What side effects may I notice from receiving this medicine? Side effects that you should report to your doctor or health care professional as soon as possible:  allergic reactions like skin rash, itching or hives, swelling of the face, lips, or tongue  breathing problems  changes in vision  chest pain  confusion  depressed mood  dizziness  feeling faint; lightheaded; falls  general ill feeling or flu-like symptoms  loss of memory  missed menstrual periods  muscle weakness  problems with balance, talking, or walking  signs and symptoms of liver injury like dark yellow or brown urine; general ill feeling or flu-like symptoms; light-colored  stools; loss of appetite; nausea; right upper belly pain; unusually weak or tired; yellowing of the eyes or skin  suicidal thoughts, mood changes  unusual bruising or bleeding  unusually weak or tired Side effects that  usually do not require medical attention (report to your doctor or health care professional if they continue or are bothersome):  headache  joint pain  muscle cramps  muscle pain  nausea, vomiting  pain, redness, or irritation at site where injected  tiredness This list may not describe all possible side effects. Call your doctor for medical advice about side effects. You may report side effects to FDA at 1-800-FDA-1088. Where should I keep my medicine? This drug is given in a hospital or clinic and will not be stored at home. NOTE: This sheet is a summary. It may not cover all possible information. If you have questions about this medicine, talk to your doctor, pharmacist, or health care provider.  2020 Elsevier/Gold Standard (2018-07-22 13:20:26)

## 2019-12-11 ENCOUNTER — Other Ambulatory Visit: Payer: Self-pay | Admitting: Student in an Organized Health Care Education/Training Program

## 2019-12-15 ENCOUNTER — Telehealth: Payer: Self-pay | Admitting: *Deleted

## 2019-12-15 NOTE — Telephone Encounter (Signed)
-----   Message from Lucious Groves, DO sent at 12/09/2019  4:47 PM EST ----- Ander Purpura, the other day when I was seeing Marah's husband she asked me about a DME pillow for her, I said it would look into it but forgot the details about it.  I was wondering if you could do me a favor and call her to see if you could figure out what I would need to order.  Let me know if that would be possible, Cindee Salt

## 2019-12-15 NOTE — Telephone Encounter (Signed)
Call placed to patient. No answer and no VMB set up. [name] Hartshorn, RN, BSN

## 2019-12-16 ENCOUNTER — Telehealth: Payer: Self-pay | Admitting: *Deleted

## 2019-12-16 NOTE — Telephone Encounter (Signed)
Ongoing pain under L breast, "feels like something is there" appt at pt's choosing 11/17 at 0945

## 2019-12-17 ENCOUNTER — Ambulatory Visit (HOSPITAL_COMMUNITY)
Admission: RE | Admit: 2019-12-17 | Discharge: 2019-12-17 | Disposition: A | Discharge status: Home / Self Care | Payer: 59 | Source: Ambulatory Visit | Attending: Student in an Organized Health Care Education/Training Program | Admitting: Student in an Organized Health Care Education/Training Program

## 2019-12-17 ENCOUNTER — Ambulatory Visit (INDEPENDENT_AMBULATORY_CARE_PROVIDER_SITE_OTHER): Payer: 59 | Admitting: Internal Medicine

## 2019-12-17 ENCOUNTER — Other Ambulatory Visit: Payer: Self-pay

## 2019-12-17 VITALS — BP 129/63 | HR 84 | Temp 98.8°F | Wt 201.5 lb

## 2019-12-17 DIAGNOSIS — R109 Unspecified abdominal pain: Secondary | ICD-10-CM

## 2019-12-17 DIAGNOSIS — R600 Localized edema: Secondary | ICD-10-CM

## 2019-12-17 DIAGNOSIS — R0789 Other chest pain: Secondary | ICD-10-CM | POA: Diagnosis not present

## 2019-12-17 MED ORDER — DICLOFENAC SODIUM 1 % EX GEL: 2.0000 g | Freq: Four times a day (QID) | CUTANEOUS | 0 refills | Status: DC

## 2019-12-17 NOTE — Patient Instructions (Addendum)
Ms. Kiyanna Biegler,  It was a pleasure to see you today. Thank you for coming in.   Today we discussed your flank pain.  This looks like it may be related to your muscles or tendons in that area. You can start using voltaren gel for the pain. Please try to avoid tight fitting bras. We are getting a chest x-ray to rule out other causes of the pain.   We also discussed your request for the knee pillow. I have placed an order but it may not be covered by your insurance.    Please return to clinic in 3 months or sooner if needed.   Thank you again for coming in.   Asencion Noble.D.

## 2019-12-17 NOTE — Telephone Encounter (Signed)
Patient in clinic today to see Provider. Patient is requesting a pillow to help with LE edema. CM sent to Skeet Latch at [employer] to see if they carry this item and if insurance will cover. [name] Hartshorn, BSN, RN-BC

## 2019-12-17 NOTE — Progress Notes (Signed)
   CC: Left side pain and request for leg elevation pillow  HPI:  Tamara [name] is a 59 y.o. with history listed below including transverse myelitis, multiple sclerosis, morbid obesity, and recent Covid infection in February 2021 presenting for concerns of a left side pain.   Past Medical History:  Diagnosis Date  . Anemia   . Bowel obstruction ([place])   . CKD (chronic kidney disease), stage II    based on labs  . GERD (gastroesophageal reflux disease)   . IBS (irritable bowel syndrome)    at age of 22  . Multiple sclerosis ([place])   . RBBB   . Sciatica 2009  . Vision abnormalities    Review of Systems:   Constitutional: Negative for chills and fever.  Respiratory: Positive for shortness of breath and cough. Cardiovascular: Negative for chest pain and leg swelling.  Gastrointestinal: Negative for abdominal pain, nausea and vomiting.  Musculoskeletal: Positive for left rib area pain. Neurological: Negative for dizziness and headaches.   Physical Exam:  Vitals:   12/17/19 1017  BP: 129/63  Pulse: 84  Temp: 98.8 F (37.1 C)  TempSrc: Oral  SpO2: 100%  Weight: 201 lb 8 oz (91.4 kg)   Physical Exam Constitutional:      Appearance: Normal appearance.  HENT:     Mouth/Throat:     Mouth: Mucous membranes are moist.     Pharynx: Oropharynx is clear.  Eyes:     Conjunctiva/sclera: Conjunctivae normal.     Pupils: Pupils are equal, round, and reactive to light.  Cardiovascular:     Rate and Rhythm: Normal rate and regular rhythm.     Pulses: Normal pulses.     Heart sounds: Normal heart sounds.  Pulmonary:     Effort: Pulmonary effort is normal.     Breath sounds: Normal breath sounds. No wheezing or rales.  Chest:     Chest wall: No tenderness.  Abdominal:     General: Abdomen is flat. Bowel sounds are normal.     Palpations: Abdomen is soft.  Musculoskeletal:        General: Tenderness (Pain over small, 2 cm area, left axilla around T5 dermatome ) present. Normal  range of motion.     Right lower leg: Edema present.     Left lower leg: Edema present.  Skin:    General: Skin is warm and dry.     Capillary Refill: Capillary refill takes less than 2 seconds.     Findings: No bruising, lesion or rash.  Neurological:     General: No focal deficit present.     Mental Status: She is alert and oriented to person, place, and time.  Psychiatric:        Mood and Affect: Mood normal.        Behavior: Behavior normal.      Assessment & Plan:   See Encounters Tab for problem based charting.  Patient discussed with Dr. Evette Doffing

## 2019-12-18 DIAGNOSIS — R0789 Other chest pain: Secondary | ICD-10-CM | POA: Insufficient documentation

## 2019-12-18 NOTE — Assessment & Plan Note (Signed)
Patient continues to endorse some bilateral lower extremity edema that has been improving, there is concern for venous insufficiency in the past and she has been trying to use pillows at night to elevate her legs.  She reports significant difficulty with keeping her legs elevated at night because her legs will not stay on the pillow.  She is requesting a leg elevation pillow to assist with elevating her legs. -DME order for leg elevation pillow

## 2019-12-18 NOTE — Assessment & Plan Note (Signed)
Patient reports she she has been having left side around her rib area that is worse with taking a deep breath and improves with exhaling.  She feels that it is related to her lungs.  She thinks that the pain is getting worse.  She does endorse some chronic shortness of breath, that worsens when she lays flat, also endorses a nonproductive cough she states that the pain improves when she takes her medications, she does not know which particular medications help.  She denies any fevers, chills, nausea, vomiting, chest pain, headaches, lightheadedness, dizziness, or other symptoms.  Patient is currently on multiple medications for her other conditions including xanax, natalizumab, pantoprazole, phentermine, and topamax.  She did not take her baclofen due to side effects.  On chart review patient had a chest x-ray on 6/4 that showed no acute abnormality.  Vitals are stable, afebrile with good oxygen saturation.  On exam her lungs are CTA BL, no wheezing, rhonchi, or rales noted, cardiac exam unremarkable, she has a small tender approximately 2 cm area along the left axilla around the T5 dermatome, no pain along the dermatome.  Evaluated with ultrasound with Dr. Evette Doffing, no obvious abscess or nodules noted. Given the exam findings of significant tenderness to palpation around her left axilla this appears to be more musculoskeletal in nature, does appear to be around where her current bra line is compressing which could be contributing.  Pulmonary cause appears less likely however given the pleuritic nature, persistent bilateral lower extremity edema, and orthopnea will obtain x-ray to assess for any lung abnormality. -Voltaren gel -Advised to wear loosefitting clothing, and limit use of tight bras -Chest x-ray

## 2019-12-19 NOTE — Progress Notes (Signed)
Internal Medicine Clinic Attending  I saw and evaluated the patient.  I personally confirmed the key portions of the history and exam documented by Dr. Sherry Ruffing and I reviewed pertinent patient test results.  The assessment, diagnosis, and plan were formulated together and I agree with the documentation in the resident's note.   Ultrasound of the area of pain on her left lateral chest was normal, no nodules, cysts, or masses in the sucutaneous tissue. Chest xray also normal, no rib or pleural disease. I suspect a tendon strain or inflammation of cooper's ligaments related to obesity and breast tissue. Has a good chance of resolving with supportive care.

## 2019-12-29 ENCOUNTER — Ambulatory Visit (HOSPITAL_COMMUNITY)
Admission: RE | Admit: 2019-12-29 | Discharge: 2019-12-29 | Disposition: A | Discharge status: Home / Self Care | Payer: 59 | Source: Ambulatory Visit | Attending: Internal Medicine | Admitting: Internal Medicine

## 2019-12-29 ENCOUNTER — Other Ambulatory Visit: Payer: Self-pay

## 2019-12-29 DIAGNOSIS — G35 Multiple sclerosis: Secondary | ICD-10-CM | POA: Diagnosis not present

## 2019-12-29 MED ORDER — SODIUM CHLORIDE 0.9 % IV SOLN
INTRAVENOUS | Status: DC | PRN
  Administered 2019-12-29: 250 mL via INTRAVENOUS

## 2019-12-29 MED ORDER — SODIUM CHLORIDE 0.9 % IV SOLN
300.0000 mg | INTRAVENOUS | Status: DC
  Administered 2019-12-29: 300 mg via INTRAVENOUS
  Filled 2019-12-29: qty 15

## 2019-12-29 NOTE — Progress Notes (Signed)
PATIENT CARE CENTER NOTE  Diagnosis:Multiple Sclerosis G35   Provider:Sater, Richard, MD   Procedure:Tysabri infusion   Note:Patient received Tysabri infusion via PIV. Tolerated well with no adverse reaction. Patient declined 1 hour observation post-infusion. Vital signs stable. Discharge instructions given. Patient alert, oriented and ambulatory at discharge.

## 2020-01-27 ENCOUNTER — Other Ambulatory Visit: Payer: Self-pay

## 2020-01-27 ENCOUNTER — Ambulatory Visit (HOSPITAL_COMMUNITY)
Admission: RE | Admit: 2020-01-27 | Discharge: 2020-01-27 | Disposition: A | Discharge status: Home / Self Care | Payer: 59 | Source: Ambulatory Visit | Attending: Internal Medicine | Admitting: Internal Medicine

## 2020-01-27 DIAGNOSIS — G35 Multiple sclerosis: Secondary | ICD-10-CM | POA: Insufficient documentation

## 2020-01-27 MED ORDER — SODIUM CHLORIDE 0.9 % IV SOLN
INTRAVENOUS | Status: DC | PRN
  Administered 2020-01-27: 250 mL via INTRAVENOUS

## 2020-01-27 MED ORDER — SODIUM CHLORIDE 0.9 % IV SOLN
300.0000 mg | INTRAVENOUS | Status: DC
  Administered 2020-01-27: 300 mg via INTRAVENOUS
  Filled 2020-01-27: qty 15

## 2020-01-27 NOTE — Discharge Instructions (Signed)
Natalizumab injection What is this medicine? NATALIZUMAB (na ta LIZ you mab) is used to treat relapsing multiple sclerosis. This drug is not a cure. It is also used to treat Crohn's disease. This medicine may be used for other purposes; ask your health care provider or pharmacist if you have questions. COMMON BRAND NAME(S): Tysabri What should I tell my health care provider before I take this medicine? They need to know if you have any of these conditions:  immune system problems  progressive multifocal leukoencephalopathy (PML)  an unusual or allergic reaction to natalizumab, other medicines, foods, dyes, or preservatives  pregnant or trying to get pregnant  breast-feeding How should I use this medicine? This medicine is for infusion into a vein. It is given by a health care professional in a hospital or clinic setting. A special MedGuide will be given to you by the pharmacist with each prescription and refill. Be sure to read this information carefully each time. Talk to your pediatrician regarding the use of this medicine in children. This medicine is not approved for use in children. Overdosage: If you think you have taken too much of this medicine contact a poison control center or emergency room at once. NOTE: This medicine is only for you. Do not share this medicine with others. What if I miss a dose? It is important not to miss your dose. Call your doctor or health care professional if you are unable to keep an appointment. What may interact with this medicine? Do not take this medicine with any of the following medications:  biologic medicines such as adalimumab, certolizumab, etanercept, golimumab, infliximab This medicine may also interact with the following medications:  azathioprine  cyclosporine  interferons  6-mercaptopurine  methotrexate  other medicines that lower your chance of fighting an infection  steroid medicines like prednisone or  cortisone  vaccines This list may not describe all possible interactions. Give your health care provider a list of all the medicines, herbs, non-prescription drugs, or dietary supplements you use. Also tell them if you smoke, drink alcohol, or use illegal drugs. Some items may interact with your medicine. What should I watch for while using this medicine? Your condition will be monitored carefully while you are receiving this medicine. Visit your doctor for regular check ups. Tell your doctor or healthcare professional if your symptoms do not start to get better or if they get worse. Stay away from people who are sick. Call your doctor or health care professional for advice if you get a fever, chills or sore throat, or other symptoms of a cold or flu. Do not treat yourself. In some patients, this medicine may cause a serious brain infection that may cause death. If you have any problems seeing, thinking, speaking, walking, or standing, tell your doctor right away. If you cannot reach your doctor, get urgent medical care. What side effects may I notice from receiving this medicine? Side effects that you should report to your doctor or health care professional as soon as possible:  allergic reactions like skin rash, itching or hives, swelling of the face, lips, or tongue  breathing problems  changes in vision  chest pain  confusion  depressed mood  dizziness  feeling faint; lightheaded; falls  general ill feeling or flu-like symptoms  loss of memory  missed menstrual periods  muscle weakness  problems with balance, talking, or walking  signs and symptoms of liver injury like dark yellow or brown urine; general ill feeling or flu-like symptoms; light-colored  stools; loss of appetite; nausea; right upper belly pain; unusually weak or tired; yellowing of the eyes or skin  suicidal thoughts, mood changes  unusual bruising or bleeding  unusually weak or tired Side effects that  usually do not require medical attention (report to your doctor or health care professional if they continue or are bothersome):  headache  joint pain  muscle cramps  muscle pain  nausea, vomiting  pain, redness, or irritation at site where injected  tiredness This list may not describe all possible side effects. Call your doctor for medical advice about side effects. You may report side effects to FDA at 1-800-FDA-1088. Where should I keep my medicine? This drug is given in a hospital or clinic and will not be stored at home. NOTE: This sheet is a summary. It may not cover all possible information. If you have questions about this medicine, talk to your doctor, pharmacist, or health care provider.  2020 Elsevier/Gold Standard (2018-07-22 13:20:26)  

## 2020-01-27 NOTE — Progress Notes (Addendum)
PATIENT CARE CENTER NOTE  Diagnosis:Multiple Sclerosis G35   Provider:Sater, Richard, MD   Procedure:Tysabri infusion   Note:Patient received Tysabri infusion via PIV. Tolerated well with no adverse reaction.  Observed patient for 1 hour post-infusion. Vital signs stable. AVS offered but patient refused. Patient alert, oriented and ambulatory at discharge. 

## 2020-02-02 ENCOUNTER — Telehealth: Payer: Self-pay | Admitting: *Deleted

## 2020-02-02 NOTE — Telephone Encounter (Signed)
Faxed completed/signed Tysabri pt status report and reauth questionnaire to [facility] at [phone]. Received confirmation.   Received fax notification that pt is re-auth for Tysabri via touch program 02/02/2020-08/26/2020. Account: [facility]. Site auth number: [id number]. Pt enrollment number: [id number].

## 2020-02-18 ENCOUNTER — Ambulatory Visit: Payer: 59 | Admitting: Neurology

## 2020-02-23 ENCOUNTER — Telehealth: Payer: Self-pay | Admitting: *Deleted

## 2020-02-23 NOTE — Telephone Encounter (Signed)
Faxed updated Tysabri infusion orders to [facility] at [phone]. Received fax confirmation. Current auth expires 02/29/20. Pt scheduled for infusion tomorrow. I submitted re-auth via [place] [facility] by fax at [phone]. Received fax confirmation. Waiting on determination.

## 2020-02-24 ENCOUNTER — Encounter (HOSPITAL_COMMUNITY): Payer: 59

## 2020-03-01 ENCOUNTER — Ambulatory Visit: Payer: 59 | Admitting: Neurology

## 2020-03-01 ENCOUNTER — Encounter: Payer: Self-pay | Admitting: Neurology

## 2020-03-01 NOTE — Telephone Encounter (Signed)
Pt has called stating she was returning the call to [place], Therapist, sports. Pt was made aware of the entry from [place], [place] this morning.  Pt is asking for a call from [place], [place] when available.

## 2020-03-01 NOTE — Telephone Encounter (Signed)
Per Lattie Haw, "she is switching to [facility] and will call back to r/s at a later time"

## 2020-03-01 NOTE — Telephone Encounter (Signed)
Spoke with Lattie Haw, she will call back and assist pt in getting appt r/s

## 2020-03-01 NOTE — Telephone Encounter (Signed)
Received denial letter from [facility] that was sent from [facility]. They stated they did not receive further info that was requested for PA review, so it was denied. Our office did not receive a request for further info. I submitted appeal letter to [facility] at [phone]. Marked urgent. Received fax confirmation. Waiting on determination.

## 2020-03-02 ENCOUNTER — Emergency Department (HOSPITAL_COMMUNITY): Payer: 59

## 2020-03-02 ENCOUNTER — Encounter: Payer: 59 | Admitting: Student

## 2020-03-02 ENCOUNTER — Telehealth: Payer: Self-pay

## 2020-03-02 ENCOUNTER — Other Ambulatory Visit: Payer: Self-pay

## 2020-03-02 ENCOUNTER — Encounter (HOSPITAL_COMMUNITY): Payer: Self-pay | Admitting: Emergency Medicine

## 2020-03-02 ENCOUNTER — Emergency Department (HOSPITAL_COMMUNITY)
Admission: EM | Admit: 2020-03-02 | Discharge: 2020-03-02 | Disposition: A | Discharge status: Home / Self Care | Payer: 59 | Attending: Emergency Medicine | Admitting: Emergency Medicine

## 2020-03-02 DIAGNOSIS — J069 Acute upper respiratory infection, unspecified: Secondary | ICD-10-CM | POA: Diagnosis not present

## 2020-03-02 DIAGNOSIS — Z20822 Contact with and (suspected) exposure to covid-19: Secondary | ICD-10-CM | POA: Diagnosis not present

## 2020-03-02 DIAGNOSIS — R0981 Nasal congestion: Secondary | ICD-10-CM | POA: Diagnosis present

## 2020-03-02 DIAGNOSIS — R0602 Shortness of breath: Secondary | ICD-10-CM

## 2020-03-02 LAB — SARS CORONAVIRUS 2 (TAT 6-24 HRS): SARS Coronavirus 2: NEGATIVE

## 2020-03-02 NOTE — Discharge Instructions (Signed)
Your covid test is pending.  Follow up with your Physician for recheck if symptoms persist

## 2020-03-02 NOTE — ED Triage Notes (Signed)
Pt arrived to ED with c/o runny nose, cough, and congestion for x3 days. Pt states that the mucous is green/yellow. Pt denies fevers, headache, body aches. Pt did state that today she started to feel weak and unable to get out of her car.

## 2020-03-02 NOTE — Telephone Encounter (Signed)
I called patient about her appointment this morning, patient drove her self to the hospital and was weak not able to get out of her car. Patient had 10:15 appointment. I talked to The Attending this morning and he felt it was better if she could go to Emergency room. I called patient back who is in her car near the Emergency room to get some one to come out and help her to get in to the Emergency room to be seen.

## 2020-03-02 NOTE — ED Provider Notes (Signed)
[facility] EMERGENCY DEPARTMENT Provider Note   CSN: [id number] Arrival date & time: 03/02/20  1144     History Chief Complaint  Patient presents with  . URI    Tamara [name] is a 60 y.o. female.  The history is provided by the patient. No language interpreter was used.  URI Presenting symptoms: congestion   Severity:  Moderate Duration:  3 days Timing:  Constant Chronicity:  New Relieved by:  Nothing Worsened by:  Nothing Ineffective treatments:  None tried Associated symptoms: myalgias   Risk factors: no sick contacts   Pt has had 3 covid vaccines. Pt had appointment today at Internal medicine clinic. Pt reports she was told to come to ED instead.     Past Medical History:  Diagnosis Date  . Anemia   . Bowel obstruction ([place])   . CKD (chronic kidney disease), stage II    based on labs  . GERD (gastroesophageal reflux disease)   . IBS (irritable bowel syndrome)    at age of 35  . Multiple sclerosis ([place])   . RBBB   . Sciatica 2009  . Vision abnormalities     Patient Active Problem List   Diagnosis Date Noted  . Left-sided chest wall pain 12/18/2019  . Spasticity 08/18/2019  . Sleep apnea 07/18/2019  . Heart failure ([place]) 07/09/2019  . Superficial phlebitis 06/27/2019  . Hand numbness 01/14/2019  . RLQ abdominal pain 10/22/2018  . Pedal edema 06/04/2018  . Gait disturbance 06/04/2018  . Bilateral knee pain 01/07/2018  . Left leg weakness 12/26/2017  . Conjunctivitis 10/30/2017  . Dental caries 07/30/2017  . Dyspnea on exertion 07/30/2017  . Morbid obesity ([place]) 12/18/2016  . High risk medication use 07/04/2016  . Multiple sclerosis ([place]) 06/19/2016  . Neurogenic bowel   . Neurogenic bladder   . Transverse myelitis ([place]) 06/14/2016  . Allergic rhinitis 04/12/2016  . Sciatica associated with disorder of lumbosacral spine 01/15/2014  . GERD (gastroesophageal reflux disease) 07/21/2013  . RBBB on EKG 08/01/2012  . IBS (irritable  bowel syndrome) 06/19/2012  . Healthcare maintenance 06/19/2012    Past Surgical History:  Procedure Laterality Date  . ABDOMINAL HYSTERECTOMY  2008   cervix and right ovary still intact  . KNEE ARTHROSCOPY  2010 and 2011   Left knee, x2     OB History    Gravida  2   Para  2   Term  2   Preterm  0   AB  0   Living  2     SAB  0   IAB  0   Ectopic  0   Multiple  0   Live Births              Family History  Problem Relation Age of Onset  . Cancer Father        Gallbladder  . Gallbladder disease Father   . Hypertension Brother   . Gallbladder disease Paternal Grandmother   . Colon cancer Neg Hx   . Colon polyps Neg Hx   . Esophageal cancer Neg Hx   . Kidney disease Neg Hx     Social History   Tobacco Use  . Smoking status: Never Smoker  . Smokeless tobacco: Never Used  Substance Use Topics  . Alcohol use: No    Alcohol/week: 0.0 standard drinks  . Drug use: No    Home Medications Prior to Admission medications   Medication Sig Start Date End Date Taking?  Authorizing Provider  acetaminophen (TYLENOL) 650 MG CR tablet Take 1,300 mg by mouth in the morning, at noon, and at bedtime.    [provider]  ALPRAZolam Duanne Moron) 1 MG tablet Take 1-2 tablets thirty minutes prior to MRI.  May take one additional tablet before entering scanner, if needed.  MUST HAVE DRIVER. 07/14/19   Sater, Nanine Means, MD  Cholecalciferol (VITAMIN D-3) 25 MCG (1000 UT) CAPS Take 2,000 Units by mouth daily.    [provider]  diclofenac Sodium (VOLTAREN) 1 % GEL Apply 2 g topically 4 (four) times daily. 12/17/19   Asencion Noble, MD  docusate sodium (COLACE) 100 MG capsule Take 1 capsule (100 mg total) by mouth daily. Patient taking differently: Take 100 mg by mouth daily as needed for mild constipation.  06/28/16   Angiulli, Lavon Paganini, PA-C  fluticasone (FLONASE) 50 MCG/ACT nasal spray Place 2 sprays into both nostrils daily. 10/14/19   Maudie Mercury, MD   loratadine (CLARITIN) 10 MG tablet Take 10 mg by mouth daily as needed for allergies or rhinitis.    [provider]  Multiple Vitamins-Minerals (HAIR/SKIN/NAILS) CAPS Take 1 capsule by mouth in the morning, at noon, and at bedtime.    [provider]  Multiple Vitamins-Minerals (ONE-A-DAY WOMENS PO) Take 1 tablet by mouth daily.    [provider]  natalizumab (TYSABRI) 300 MG/15ML injection Inject 300 mg into the vein every 30 (thirty) days.    [provider]  Omega-3 Fatty Acids (FISH OIL) 1000 MG CAPS Take 1,000-2,000 mg by mouth See admin instructions. Take 2,000 mg by mouth in the morning and 1,000 mg at bedtime    [provider]  pantoprazole (PROTONIX) 40 MG tablet Take 1 tablet by mouth once daily 12/11/19   Lucious Groves, DO  phentermine 37.5 MG capsule Take 1 capsule by mouth once daily in the morning 07/21/19   Sater, Nanine Means, MD  topiramate (TOPAMAX) 50 MG tablet TAKE 1 TABLET BY MOUTH THREE TIMES DAILY 08/27/19   Sater, Nanine Means, MD  traMADol (ULTRAM) 50 MG tablet TAKE 1 TABLET BY MOUTH TWICE DAILY AS NEEDED FOR SEVERE PAIN Patient taking differently: Take 50 mg by mouth 2 (two) times daily as needed for severe pain.  02/17/19   Sater, Nanine Means, MD    Allergies    Codeine, Dilaudid [hydromorphone hcl], Ditropan [oxybutynin], Other, Paxil [paroxetine hcl], Tegretol [carbamazepine], Trileptal [oxcarbazepine], Adhesive [tape], Augmentin [amoxicillin-pot clavulanate], and Gabapentin  Review of Systems   Review of Systems  HENT: Positive for congestion.   Musculoskeletal: Positive for myalgias.  All other systems reviewed and are negative.   Physical Exam Updated Vital Signs BP (!) 142/86   Pulse 68   Temp 99.1 F (37.3 C) (Oral)   Resp 18   SpO2 100%   Physical Exam Vitals and nursing note reviewed.  Constitutional:      Appearance: She is well-developed and well-nourished.  HENT:     Head: Normocephalic.  Eyes:      Extraocular Movements: EOM normal.  Cardiovascular:     Rate and Rhythm: Normal rate.  Pulmonary:     Effort: Pulmonary effort is normal.  Abdominal:     General: There is no distension.  Musculoskeletal:        General: Normal range of motion.     Cervical back: Normal range of motion.  Skin:    General: Skin is warm.  Neurological:     General: No focal deficit present.  Mental Status: She is alert and oriented to person, place, and time.  Psychiatric:        Mood and Affect: Mood and affect and mood normal.     ED Results / Procedures / Treatments   Labs (all labs ordered are listed, but only abnormal results are displayed) Labs Reviewed  SARS CORONAVIRUS 2 (TAT 6-24 HRS)    EKG None  Radiology No results found.  Procedures Procedures   Medications Ordered in ED Medications - No data to display  ED Course  I have reviewed the triage vital signs and the nursing notes.  Pertinent labs & imaging results that were available during my care of the patient were reviewed by me and considered in my medical decision making (see chart for details).    MDM Rules/Calculators/A&P                         Chest xray is normal  MDM:  Symptoms consistent with viral uri.  Pt wants antibiotic  Final Clinical Impression(s) / ED Diagnoses Final diagnoses:  Viral URI with cough    Rx / DC Orders ED Discharge Orders    None    An After Visit Summary was printed and given to the patient.   Fransico Meadow, PA-C 03/02/20 Jennings, [place], DO 03/02/20 1517

## 2020-03-03 NOTE — Telephone Encounter (Signed)
Called [employer]'s Utilization management department at [phone] to see if they have received appeal letter we have sent for Tysabri. I was on hold for 48min. While waiting, Shann Medal. was able to check [email or web address] and printed off recent denial dated for 03/03/20 for Tysabri. Rep, Selinda Flavin came on the line. Advised I would like to set up P2P for Dr. Felecia Shelling to complete. (Per Dr. Felecia Shelling he can do 03/04/20 at 12pm or 03/05/20 between 10-12pm). He placed me on hold to reach out to physician line. States they are having a high call volume currently. I was transferred to [employer], [place]. She states they do not do P2P for reconsideration's. States our appeal letter was reviewed as if it were re-consideration and now we must appeal that. Advised we labeled letter as appeal. She placed me on hold to reach out to see how we should proceed. She states it was processed as a reconsideration and not an appeal. We have to send in an appeal letter now. No other option. fax#[phone]. Phone# [phone].

## 2020-03-03 NOTE — Telephone Encounter (Signed)
Faxed signed appeal letter to appeals department at [phone]. Marked urgent, waiting on determination.

## 2020-03-04 DIAGNOSIS — H5213 Myopia, bilateral: Secondary | ICD-10-CM | POA: Diagnosis not present

## 2020-03-09 NOTE — Telephone Encounter (Signed)
I called to check on status of appeal. We had re-sent letter yesterday. Spoke with Joelene Millin. She transferred me to pharmacy help desk# and I spoke with [facility]. She had supervisor check behind her and they have no PA/denial or appeal on file. She is sending urgent request to appeals department to call me back to give fax# for appeal. Ref# for appeal [id number]. They have 15 days to call back. Advised them to call [phone] and ask to speak with Terrence Dupont when they all. They are not able to mark any more urgent. Explained importance of reviewing asap but they could not mark any more urgent.  Sent message to [employer] to see if they can help get pt infused while this is being worked out. Waiting on response.

## 2020-03-10 NOTE — Telephone Encounter (Signed)
Received the following message from [place] w/ [facility] 03/09/20 1715 : "Yes, I will be glad to help! I called and they closed at 4:30. I will follow up again tomorrow! As soon as I get confirmation they will accept a complimentary dose, I will let you know."

## 2020-03-11 ENCOUNTER — Other Ambulatory Visit: Payer: Self-pay | Admitting: Neurology

## 2020-03-11 ENCOUNTER — Telehealth: Payer: Self-pay | Admitting: Neurology

## 2020-03-11 MED ORDER — PHENTERMINE HCL 37.5 MG PO CAPS: ORAL_CAPSULE | ORAL | 0 refills | Status: DC

## 2020-03-11 NOTE — Telephone Encounter (Signed)
Received the following message from [place] w/ [facility]: "The patient has scheduled her next infusion for 2/16 and the complimentary dose will be delivered tomorrow! She does have new insurance, [employer] has or will be terminated. I know you have been working on the appeal. We have requested a benefits investigation & I will fax it once it is received."  [place] Patient Plan     Ins Phone [phone]      Policy# [id number] M      Group # [facility]      Effective Date 03/02/2020

## 2020-03-11 NOTE — Telephone Encounter (Signed)
Pt called wanting to speak to the RN about her medical care. Pt would not give anymore information. Please advise.

## 2020-03-11 NOTE — Telephone Encounter (Signed)
Called [employer]/Medicaid and spoke with [place]. Patient ID: [id number] M. Initiated PA Tysabri. Jcode: [id number].  Pt receives at [facility]. NPI: [id number]. Tax ID: [id number]. MCR#[id number].  Goes to PA department and they will review. She will place note that I do not have access to [facility] portal and to please call or fax requests for further info if needed. Also placed note we are closed on Friday's. Will be back in office Monday at 8am. She verbalized understanding and will include this. No clinical info taken, states PA department will reach out if needed.

## 2020-03-11 NOTE — Telephone Encounter (Signed)
[facility] Judeth Porch) called, appeal sent by Dr. Felecia Shelling has been approved for natalizumab (TYSABRI) 300 MG/15ML injection. Faxing over al letter with all the information  Contact info: [phone]

## 2020-03-11 NOTE — Telephone Encounter (Signed)
Called pt back. She was last seen 08/18/2019. Needs appt before any refills on phentermine. Pt states she has not been able to come in d/t her having [employer] That terminated in January and she now has [employer] plan. Scheduled work in appt for 03/22/20 at 3pm with Dr. Felecia Shelling. Advised I will send refill request to Dr. Felecia Shelling to refill until she can come in to be seen. She verbalized understanding.  She confirmed she is scheduled for next Tysabri 03/17/20, [employer] providing complementary dose. Advised I did submit request for PA this am via new insurance, we are waiting to hear back.

## 2020-03-17 ENCOUNTER — Non-Acute Institutional Stay (HOSPITAL_COMMUNITY)
Admission: RE | Admit: 2020-03-17 | Discharge: 2020-03-17 | Disposition: A | Discharge status: Home / Self Care | Payer: 59 | Source: Ambulatory Visit | Attending: Internal Medicine | Admitting: Internal Medicine

## 2020-03-17 ENCOUNTER — Other Ambulatory Visit: Payer: Self-pay

## 2020-03-17 DIAGNOSIS — G35 Multiple sclerosis: Secondary | ICD-10-CM | POA: Insufficient documentation

## 2020-03-17 MED ORDER — SODIUM CHLORIDE 0.9 % IV SOLN
300.0000 mg | INTRAVENOUS | Status: DC
  Administered 2020-03-17: 300 mg via INTRAVENOUS
  Filled 2020-03-17: qty 15

## 2020-03-17 MED ORDER — SODIUM CHLORIDE 0.9 % IV SOLN
INTRAVENOUS | Status: DC | PRN
  Administered 2020-03-17: 250 mL via INTRAVENOUS

## 2020-03-17 NOTE — Telephone Encounter (Signed)
Shann Medal was able to log into [facility] portal. Found no PA required for Tysabri/can do buy/bill. Faxed updated orders to [employer] at [phone]. Received fax confirmation. Also message Forestine Chute, RN with update via epic.

## 2020-03-17 NOTE — Progress Notes (Signed)
Patient received Tysabri as ordered by Arlice Colt MD. Observed for at least 60 minutes post infusion.Tolerated well, vitals stable, discharge instructions given, verbalized understanding. Alert, oriented and ambulatory at the time of discharge.

## 2020-03-17 NOTE — Discharge Instructions (Signed)
Natalizumab injection What is this medicine? NATALIZUMAB (na ta LIZ you mab) is used to treat relapsing multiple sclerosis. This drug is not a cure. It is also used to treat Crohn's disease. This medicine may be used for other purposes; ask your health care provider or pharmacist if you have questions. COMMON BRAND NAME(S): Tysabri What should I tell my health care provider before I take this medicine? They need to know if you have any of these conditions:  immune system problems  progressive multifocal leukoencephalopathy (PML)  an unusual or allergic reaction to natalizumab, other medicines, foods, dyes, or preservatives  pregnant or trying to get pregnant  breast-feeding How should I use this medicine? This medicine is for infusion into a vein. It is given by a health care professional in a hospital or clinic setting. A special MedGuide will be given to you by the pharmacist with each prescription and refill. Be sure to read this information carefully each time. Talk to your pediatrician regarding the use of this medicine in children. This medicine is not approved for use in children. Overdosage: If you think you have taken too much of this medicine contact a poison control center or emergency room at once. NOTE: This medicine is only for you. Do not share this medicine with others. What if I miss a dose? It is important not to miss your dose. Call your doctor or health care professional if you are unable to keep an appointment. What may interact with this medicine? Do not take this medicine with any of the following medications:  biologic medicines such as adalimumab, certolizumab, etanercept, golimumab, infliximab This medicine may also interact with the following medications:  azathioprine  cyclosporine  interferons  6-mercaptopurine  methotrexate  other medicines that lower your chance of fighting an infection  steroid medicines like prednisone or  cortisone  vaccines This list may not describe all possible interactions. Give your health care provider a list of all the medicines, herbs, non-prescription drugs, or dietary supplements you use. Also tell them if you smoke, drink alcohol, or use illegal drugs. Some items may interact with your medicine. What should I watch for while using this medicine? Your condition will be monitored carefully while you are receiving this medicine. Visit your doctor for regular check ups. Tell your doctor or healthcare professional if your symptoms do not start to get better or if they get worse. Stay away from people who are sick. Call your doctor or health care professional for advice if you get a fever, chills or sore throat, or other symptoms of a cold or flu. Do not treat yourself. In some patients, this medicine may cause a serious brain infection that may cause death. If you have any problems seeing, thinking, speaking, walking, or standing, tell your doctor right away. If you cannot reach your doctor, get urgent medical care. What side effects may I notice from receiving this medicine? Side effects that you should report to your doctor or health care professional as soon as possible:  allergic reactions like skin rash, itching or hives, swelling of the face, lips, or tongue  breathing problems  changes in vision  chest pain  confusion  depressed mood  dizziness  feeling faint; lightheaded; falls  general ill feeling or flu-like symptoms  loss of memory  missed menstrual periods  muscle weakness  problems with balance, talking, or walking  signs and symptoms of liver injury like dark yellow or brown urine; general ill feeling or flu-like symptoms; light-colored  stools; loss of appetite; nausea; right upper belly pain; unusually weak or tired; yellowing of the eyes or skin  suicidal thoughts, mood changes  unusual bruising or bleeding  unusually weak or tired Side effects that  usually do not require medical attention (report to your doctor or health care professional if they continue or are bothersome):  headache  joint pain  muscle cramps  muscle pain  nausea, vomiting  pain, redness, or irritation at site where injected  tiredness This list may not describe all possible side effects. Call your doctor for medical advice about side effects. You may report side effects to FDA at 1-800-FDA-1088. Where should I keep my medicine? This drug is given in a hospital or clinic and will not be stored at home. NOTE: This sheet is a summary. It may not cover all possible information. If you have questions about this medicine, talk to your doctor, pharmacist, or health care provider.  2021 Elsevier/Gold Standard (2018-07-22 13:20:26)

## 2020-03-17 NOTE — Telephone Encounter (Signed)
Pt no longer has [employer]. Changed to UHC/Medicaid. See other phone note from 02/23/20.

## 2020-03-22 ENCOUNTER — Encounter: Payer: Self-pay | Admitting: Neurology

## 2020-03-22 ENCOUNTER — Ambulatory Visit (INDEPENDENT_AMBULATORY_CARE_PROVIDER_SITE_OTHER): Payer: 59 | Admitting: Neurology

## 2020-03-22 ENCOUNTER — Telehealth: Payer: Self-pay | Admitting: *Deleted

## 2020-03-22 VITALS — BP 121/78 | HR 79 | Ht 63.0 in | Wt 200.0 lb

## 2020-03-22 DIAGNOSIS — G35 Multiple sclerosis: Secondary | ICD-10-CM | POA: Diagnosis not present

## 2020-03-22 DIAGNOSIS — Z79899 Other long term (current) drug therapy: Secondary | ICD-10-CM

## 2020-03-22 DIAGNOSIS — R200 Anesthesia of skin: Secondary | ICD-10-CM

## 2020-03-22 DIAGNOSIS — N319 Neuromuscular dysfunction of bladder, unspecified: Secondary | ICD-10-CM

## 2020-03-22 DIAGNOSIS — E538 Deficiency of other specified B group vitamins: Secondary | ICD-10-CM | POA: Insufficient documentation

## 2020-03-22 MED ORDER — TOPIRAMATE 50 MG PO TABS: 50.0000 mg | ORAL_TABLET | Freq: Three times a day (TID) | ORAL | 3 refills | Status: DC

## 2020-03-22 NOTE — Telephone Encounter (Signed)
Placed JCV lab in [facility] lock box for routine lab pick up. Results pending. 

## 2020-03-22 NOTE — Progress Notes (Signed)
[facility]  PATIENT: Tamara [name] DOB: December 18, 1960  REFERRING DOCTOR OR PCP:  Nadean Corwin SOURCE: Patient, notes from emergency room and recent hospital stay ([facility]), imaging and lab reports, MRI images on PACS.  _________________________________   HISTORICAL  CHIEF COMPLAINT:  Chief Complaint  Patient presents with   Follow-up    RM 12. Last seen 08/18/2019. On Tysabri for MS. Last infusion 03/17/20, next: 04/12/20. Receives at [facility]. Last JCV 07/25/19, negative, index: 0.16.     HISTORY OF PRESENT ILLNESS:  Tamara [name] Is a 60 y.o. woman with relapsing remitting multiple sclerosis in May 2018 after she presented with transverse myelitis.   Update 08/18/2019: She is on Tysabri she tolerated well and there has been no definite exacerbations.  Last infusion 03/17/20. JCV Ab 07/25/2019 was 0.16 negative.  She denies any exacerbations.     She feels balance and gait are worse.   She notes she does worse in general the week before her infusions.   She sleeps worse the last week of each cycle and generally feels more tired with more neurologic symptoms at that time.  At the last infusion [facility]) she was noted to have more muscle spasms and this appointment was set up to further discuss..     She has dysesthesias in her hands, helped by wearing gloves (thick beaded green nitrile gloves).   She takes tramadol pain is worse.     She could not tolerate gabapentin, Lyrica, lamotrigine and oxcarbazepine.   She had ankle swelling with nortriptyline.  NCV/EMG was fine so probably from spinal cord plaques.     She had MRIs of the brain and cervical spine 07/16/2019 and I compared to the 2019 brain and 2018 cervical spine .  There are no new lesions. She has old cervical spine lesions at C3 and C5.  The brain shows multiple chronic foci in the hemispheres.  None in brainstem or cerebellum.  No focus enhances.  Additionally, there was no evidence of PML.  She is on  topiramate for h.  Last level was low normal.  eadaches and phentermine as a stimulant with benefit.     She has had a vit D deficiency and takes D3.  The topiramate is costing her more this year than last year and she asked if we could do anything about this.    She has had Covid-19 initial double shot followed by a booster.   She is planning on a second booster.     MS History:   On 06/10/16, she had the onset of numbness and clumsiness in her legs.   As the day went on, her hands also became numb and clumsy.  She felt numb from her waist down.    She went to the [facility] and had an xtay of her back and was referred to orthopedics     Two days later, she went to the ED when her symptoms worsened and she was seen and discharged.    She saw her internist who ordered a lumbar MRI.  The lumbar MRI showed degenerative changes that were mild at L4-L5 and the study was otherwise fairly normal.   She was referred to the ED and had an MRI of the brain, cervical spine and thoracic spine.    She was found to have a transverse myelitis in the cervical spine with an enhancing lesion in the posterior columns. There was also a smaller non-enhancing focus at C5  She also had 5-6 spots in her brain, the largest being periventricular on the right.  She was admitted to [facility] and had 3 days of IV Solumedrol followed by an oral taper.    She was transferred to rehabilitation and was discharged 5/30.   She walked out and felt much better.   Additionally, while she was in the hospital (06/15/2016) she had a lumbar puncture. The CSF was abnormal showing showing the presence  4 oligoclonal bands and an elevated IgG index of 0.9 (less than 0.7 is normal). The myelin basic protein was mildly elevated at 2.2.      She had another exacerbation after returning home with left leg dragging and feeling more off balance with stumbling.   In retrospect, in 2017, she had an episode lasting a few weeks where she felt she was  dragging her left leg some. This completely resolved and she did not have any other symptoms such as numbness at that time.   MRIs of the brain, cervical spine, thoracic spine and lumbar spine were personally reviewed. The MRI of the cervical spine shows an enhancing lesion posterior spinal cord adjacent to C2-C3. Additionally there is a nonenhancing focus adjacent to C5. In the brain, there are several T2/FLAIR hyperintense foci and the largest is in the right parietal lobe and there are 4 or 5 other small foci, one in the periventricular white matter of the right frontal lobe and the rest in the subcortical white matter.    Laboratory studies show 4 oligoclonal bands and an elevated IgG index of 0.9. Myelin basic protein was mildly elevated at 2.2. She had elevated glucose between 120 and 140 several times while getting steroids.   Her Vit D was mildly low (25.6) and she just started OTC supplementation.   She started Tysabri in July 2018  IMAGING  MRIs of the brain and cervical spine 07/16/2019 compared to the 2019 brain and 2018 cervical spine .  There are no new lesions. She has old cervical spine lesions at C3 and C5.  The brain shows multiple chronic foci in the hemispheres.  None in brainstem or cerebellum.  No focus enhances.     REVIEW OF SYSTEMS: Constitutional: No fevers, chills, sweats, or change in appetite Eyes: No visual changes, double vision, eye pain Ear, nose and throat: No hearing loss, ear pain, nasal congestion, sore throat Cardiovascular: No chest pain, palpitations Respiratory: No shortness of breath at rest or with exertion.   No wheezes GastrointestinaI: No nausea, vomiting, diarrhea, abdominal pain, fecal incontinence Genitourinary: No dysuria, urinary retention or frequency.  No nocturia. Musculoskeletal: No neck pain, back pain Integumentary: No rash, pruritus, skin lesions Neurological: as above Psychiatric: No depression at this time.  No anxiety Endocrine: No  palpitations, diaphoresis, change in appetite, change in weigh or increased thirst Hematologic/Lymphatic: No anemia, purpura, petechiae. Allergic/Immunologic: No itchy/runny eyes, nasal congestion, recent allergic reactions, rashes  ALLERGIES: Allergies  Allergen Reactions   Codeine Other (See Comments)    Delusions   Dilaudid [Hydromorphone Hcl] Swelling and Other (See Comments)    Tongue swells    Ditropan [Oxybutynin] Other (See Comments)    Burning sensation   Other Swelling and Other (See Comments)    Unnamed gel or antiseptic solution- Was applied to IV site with a needle- Turned the skin "black and blue" that remained (caused burning and phlebitis, also)   Paxil [Paroxetine Hcl] Other (See Comments)    Hallucinations and heavy periods    Tegretol [  Carbamazepine] Itching   Trileptal [Oxcarbazepine] Swelling   Adhesive [Tape] Hives, Itching, Rash and Other (See Comments)    PAPER TAPE   Augmentin [Amoxicillin-Pot Clavulanate] Rash   Gabapentin Rash and Other (See Comments)    Hallucinations and depression, also    HOME MEDICATIONS:  Current Outpatient Medications:    acetaminophen (TYLENOL) 650 MG CR tablet, Take 1,300 mg by mouth in the morning, at noon, and at bedtime., Disp: , Rfl:    ALPRAZolam (XANAX) 1 MG tablet, Take 1-2 tablets thirty minutes prior to MRI.  May take one additional tablet before entering scanner, if needed.  MUST HAVE DRIVER., Disp: 3 tablet, Rfl: 0   Cholecalciferol (VITAMIN D-3) 25 MCG (1000 UT) CAPS, Take 2,000 Units by mouth daily., Disp: , Rfl:    docusate sodium (COLACE) 100 MG capsule, Take 1 capsule (100 mg total) by mouth daily. (Patient taking differently: Take 100 mg by mouth daily as needed for mild constipation.), Disp: 10 capsule, Rfl: 0   fluticasone (FLONASE) 50 MCG/ACT nasal spray, Place 2 sprays into both nostrils daily., Disp: 16 g, Rfl: 2   loratadine (CLARITIN) 10 MG tablet, Take 10 mg by mouth daily as needed for  allergies or rhinitis., Disp: , Rfl:    Multiple Vitamins-Minerals (HAIR/SKIN/NAILS) CAPS, Take 1 capsule by mouth in the morning, at noon, and at bedtime., Disp: , Rfl:    Multiple Vitamins-Minerals (ONE-A-DAY WOMENS PO), Take 1 tablet by mouth daily., Disp: , Rfl:    natalizumab (TYSABRI) 300 MG/15ML injection, Inject 300 mg into the vein every 30 (thirty) days., Disp: , Rfl:    Omega-3 Fatty Acids (FISH OIL) 1000 MG CAPS, Take 1,000-2,000 mg by mouth See admin instructions. Take 2,000 mg by mouth in the morning and 1,000 mg at bedtime, Disp: , Rfl:    pantoprazole (PROTONIX) 40 MG tablet, Take 1 tablet by mouth once daily, Disp: 90 tablet, Rfl: 1   phentermine 37.5 MG capsule, Take 1 capsule by mouth once daily in the morning, Disp: 30 capsule, Rfl: 0   traMADol (ULTRAM) 50 MG tablet, TAKE 1 TABLET BY MOUTH TWICE DAILY AS NEEDED FOR SEVERE PAIN (Patient taking differently: Take 50 mg by mouth 2 (two) times daily as needed for severe pain.), Disp: 60 tablet, Rfl: 0   diclofenac Sodium (VOLTAREN) 1 % GEL, Apply 2 g topically 4 (four) times daily. (Patient not taking: Reported on 03/22/2020), Disp: 150 g, Rfl: 0   topiramate (TOPAMAX) 50 MG tablet, Take 1 tablet (50 mg total) by mouth 3 (three) times daily., Disp: 270 tablet, Rfl: 3  PAST MEDICAL HISTORY: Past Medical History:  Diagnosis Date   Anemia    Bowel obstruction (HCC)    CKD (chronic kidney disease), stage II    based on labs   GERD (gastroesophageal reflux disease)    IBS (irritable bowel syndrome)    at age of 73   Multiple sclerosis (HCC)    RBBB    Sciatica 2009   Vision abnormalities     PAST SURGICAL HISTORY: Past Surgical History:  Procedure Laterality Date   ABDOMINAL HYSTERECTOMY  2008   cervix and right ovary still intact   KNEE ARTHROSCOPY  2010 and 2011   Left knee, x2    FAMILY HISTORY: Family History  Problem Relation Age of Onset   Cancer Father        Gallbladder   Gallbladder  disease Father    Hypertension Brother    Gallbladder disease Paternal Grandmother  Colon cancer Neg Hx    Colon polyps Neg Hx    Esophageal cancer Neg Hx    Kidney disease Neg Hx     SOCIAL HISTORY:  Social History   Socioeconomic History   Marital status: Married    Spouse name: Not on file   Number of children: 2   Years of education: Not on file   Highest education level: Not on file  Occupational History   Occupation: Science writer  Tobacco Use   Smoking status: Never Smoker   Smokeless tobacco: Never Used  Substance and Sexual Activity   Alcohol use: No    Alcohol/week: 0.0 standard drinks   Drug use: No   Sexual activity: Not on file  Other Topics Concern   Not on file  Social History Narrative   Not on file   Social Determinants of Health   Financial Resource Strain: Not on file  Food Insecurity: Not on file  Transportation Needs: Not on file  Physical Activity: Not on file  Stress: Not on file  Social Connections: Not on file  Intimate Partner Violence: Not on file     PHYSICAL EXAM  Vitals:   03/22/20 1608  BP: 121/78  Pulse: 79  SpO2: 98%  Weight: 200 lb (90.7 kg)  Height: 5\' 3"  (1.6 m)    Body mass index is 35.43 kg/m.   General: The patient is well-developed and well-nourished and in no acute distress   Neurologic Exam  Mental status: The patient is alert and oriented x 3 at the time of the examination. The patient has apparent normal recent and remote memory, with an apparently normal attention span and concentration ability.   Speech is normal.  Cranial nerves: Extraocular movements are full. Facial strength and sensation is normal. Trapezius strength is normal.. Hearing appears to be symmetric.  Motor:  Muscle bulk is normal.   Muscle tone is normal. Strength is 5/5 except 4+/5 hip flexion (iliopsoas).      Sensory: She appeared to have normal and symmetric sensation  Coordination: Cerebellar testing  reveals good finger-nose-finger and normal heel-to-shin bilaterally.  Gait and station: Station is normal.   Gait is mildly wide.  Tandem gait is poor. She has a left foot drop. She does not have a Romberg sign.  Reflexes:  . Deep tendon reflexes are 3 and symmetric in the knees and ankles and 2 and symmetric in the arms.      DIAGNOSTIC DATA (LABS, IMAGING, TESTING) - I reviewed patient records, labs, notes, testing and imaging myself where available.  Lab Results  Component Value Date   WBC 6.9 07/25/2019   HGB 11.6 07/25/2019   HCT 36.3 07/25/2019   MCV 88 07/25/2019   PLT 177 07/25/2019      Component Value Date/Time   NA 141 07/10/2019 0519   NA 144 10/22/2018 1658   K 3.4 (L) 07/10/2019 0519   CL 112 (H) 07/10/2019 0519   CO2 23 07/10/2019 0519   GLUCOSE 103 (H) 07/10/2019 0519   BUN 17 07/10/2019 0519   BUN 13 10/22/2018 1658   CREATININE 1.13 (H) 07/10/2019 0519   CREATININE 1.01 07/08/2013 1440   CALCIUM 8.6 (L) 07/10/2019 0519   PROT 6.2 (L) 07/09/2019 1956   PROT 6.4 10/22/2018 1658   ALBUMIN 3.8 07/09/2019 1956   ALBUMIN 4.4 10/22/2018 1658   AST 16 07/09/2019 1956   ALT 15 07/09/2019 1956   ALKPHOS 59 07/09/2019 1956   BILITOT 0.5 07/09/2019 1956  BILITOT <0.2 10/22/2018 1658   GFRNONAA 54 (L) 07/10/2019 0519   GFRNONAA 64 07/08/2013 1440   GFRAA >60 07/10/2019 0519   GFRAA 74 07/08/2013 1440   No results found for: CHOL, HDL, LDLCALC, LDLDIRECT, TRIG, CHOLHDL No results found for: ZOXW9U Lab Results  Component Value Date   VITAMINB12 2,209 (H) 06/15/2016   Lab Results  Component Value Date   TSH 0.351 06/15/2016       ASSESSMENT AND PLAN  Multiple sclerosis (HCC) - Plan: CBC with Differential/Platelet, Stratify JCV Antibody Test ([facility])  High risk medication use - Plan: CBC with Differential/Platelet, Stratify JCV Antibody Test ([facility])  Bilateral hand numbness  Neurogenic bladder  B12 deficiency - Plan: Vitamin B12   1.    Continue Tysabri.. We will check JCV antibody and CBC. If she converts from negative to high positive we will need to consider a different disease modifying therapy 2.   She will continue her medications for the MS symptoms 3.   Stay active and exercise.     4.   If B12 is low will need supplementation. 5.   RTC 6  months.   She is advised to call sooner if she has any new or worsening neurologic symptoms.   Shasta Chinn A. Epimenio Foot, MD, PhD, [place]  03/22/2020, 4:28 PM Certified in Neurology, Clinical Neurophysiology, Sleep Medicine, Pain Medicine and Neuroimaging Dir., [facility]  [facility] [address], [address] [place], [place] [address] [phone]

## 2020-03-23 LAB — CBC WITH DIFFERENTIAL/PLATELET
Basophils Absolute: 0 10*3/uL (ref 0.0–0.2)
Basos: 0 %
EOS (ABSOLUTE): 0 10*3/uL (ref 0.0–0.4)
Eos: 0 %
Hematocrit: 36.7 % (ref 34.0–46.6)
Hemoglobin: 11.8 g/dL (ref 11.1–15.9)
Immature Grans (Abs): 0 10*3/uL (ref 0.0–0.1)
Immature Granulocytes: 0 %
Lymphocytes Absolute: 2.8 10*3/uL (ref 0.7–3.1)
Lymphs: 41 %
MCH: 28.4 pg (ref 26.6–33.0)
MCHC: 32.2 g/dL (ref 31.5–35.7)
MCV: 88 fL (ref 79–97)
Monocytes Absolute: 0.6 10*3/uL (ref 0.1–0.9)
Monocytes: 9 %
NRBC: 1 % — ABNORMAL HIGH (ref 0–0)
Neutrophils Absolute: 3.3 10*3/uL (ref 1.4–7.0)
Neutrophils: 50 %
Platelets: 175 10*3/uL (ref 150–450)
RBC: 4.15 x10E6/uL (ref 3.77–5.28)
RDW: 14.5 % (ref 11.7–15.4)
WBC: 6.7 10*3/uL (ref 3.4–10.8)

## 2020-03-23 LAB — VITAMIN B12: Vitamin B-12: 1059 pg/mL (ref 232–1245)

## 2020-03-29 NOTE — Telephone Encounter (Signed)
JCV ab drawn on 03/22/20 negative, index: 0.16

## 2020-04-12 ENCOUNTER — Non-Acute Institutional Stay (HOSPITAL_COMMUNITY)
Admission: RE | Admit: 2020-04-12 | Discharge: 2020-04-12 | Disposition: A | Discharge status: Home / Self Care | Payer: 59 | Source: Ambulatory Visit | Attending: Internal Medicine | Admitting: Internal Medicine

## 2020-04-15 ENCOUNTER — Encounter: Payer: 59 | Admitting: Internal Medicine

## 2020-04-15 ENCOUNTER — Telehealth: Payer: Self-pay | Admitting: *Deleted

## 2020-04-15 NOTE — Telephone Encounter (Signed)
Called Forestine Chute, RN at [name]. He is wondering if pt is approved for Tysabri infusion. They have note from [facility] that she is approved. Advised pt no longer has this insurance. Now has UHC/Medicaid. No PA needed for Tysabri. We had sent updated orders on 03/17/20. He did not receive this. I resent orders and received fax confirmation (fax [phone]).

## 2020-04-15 NOTE — Telephone Encounter (Signed)
Pt did not come to her appt this morning @ 1045AM,  I called pt -  Informed her pt her appt was @ 1045 AM and now it's 1130 AM. Stated she was aware of her appt but she has been driving around for 30 mins trying to find a parking space. I asked if she wanted to re-schedule the appt ; she stated once she find a space she will come to the office to schedule an appt. Informed pt the office will be closing soon for lunch. Informed next available appt with Dr Heber [place] will April 21st@ 0930am. Also informed pt she can use valet parking, she stated she cannot walk all the way down here; reminded her we also have volunteers w/wheelchairs to bring her here.She asked if someone could see her today; informed yes @ 1345 PM but she will be seeing someone else not Dr Heber [place]. After thinking about what she wanted to do; she decided to keep appt in April to see Dr Heber [place]. Told her if she needs to see someone before this date, give [place] a call back; she stated ok.

## 2020-04-16 ENCOUNTER — Non-Acute Institutional Stay (HOSPITAL_COMMUNITY)
Admission: RE | Admit: 2020-04-16 | Discharge: 2020-04-16 | Disposition: A | Discharge status: Home / Self Care | Payer: Medicaid Other | Source: Ambulatory Visit | Attending: Internal Medicine | Admitting: Internal Medicine

## 2020-04-16 ENCOUNTER — Other Ambulatory Visit: Payer: Self-pay

## 2020-04-16 DIAGNOSIS — G35 Multiple sclerosis: Secondary | ICD-10-CM | POA: Diagnosis present

## 2020-04-16 MED ORDER — DIPHENHYDRAMINE HCL 25 MG PO CAPS: 25.0000 mg | ORAL_CAPSULE | ORAL | Status: DC | PRN

## 2020-04-16 MED ORDER — ACETAMINOPHEN 325 MG PO TABS: 650.0000 mg | ORAL_TABLET | ORAL | Status: DC | PRN

## 2020-04-16 MED ORDER — SODIUM CHLORIDE 0.9 % IV SOLN
INTRAVENOUS | Status: DC | PRN
  Administered 2020-04-16: 250 mL via INTRAVENOUS

## 2020-04-16 MED ORDER — SODIUM CHLORIDE 0.9 % IV SOLN
300.0000 mg | INTRAVENOUS | Status: DC
  Administered 2020-04-16: 300 mg via INTRAVENOUS
  Filled 2020-04-16: qty 15

## 2020-04-16 NOTE — Progress Notes (Signed)
PATIENT CARE CENTER NOTE  Diagnosis:Multiple Sclerosis G35   Provider:Sater, Richard, MD   Procedure:Tysabri infusion   Note:Patient received Tysabri infusion via PIV. Tolerated well with no adverse reaction.  Observed patient for 1 hour post-infusion. Vital signs stable. AVS offered but patient refused. Patient alert, oriented and ambulatory at discharge.

## 2020-04-16 NOTE — Discharge Instructions (Signed)
Natalizumab injection What is this medicine? NATALIZUMAB (na ta LIZ you mab) is used to treat relapsing multiple sclerosis. This drug is not a cure. It is also used to treat Crohn's disease. This medicine may be used for other purposes; ask your health care provider or pharmacist if you have questions. COMMON BRAND NAME(S): Tysabri What should I tell my health care provider before I take this medicine? They need to know if you have any of these conditions:  immune system problems  progressive multifocal leukoencephalopathy (PML)  an unusual or allergic reaction to natalizumab, other medicines, foods, dyes, or preservatives  pregnant or trying to get pregnant  breast-feeding How should I use this medicine? This medicine is for infusion into a vein. It is given by a health care professional in a hospital or clinic setting. A special MedGuide will be given to you by the pharmacist with each prescription and refill. Be sure to read this information carefully each time. Talk to your pediatrician regarding the use of this medicine in children. This medicine is not approved for use in children. Overdosage: If you think you have taken too much of this medicine contact a poison control center or emergency room at once. NOTE: This medicine is only for you. Do not share this medicine with others. What if I miss a dose? It is important not to miss your dose. Call your doctor or health care professional if you are unable to keep an appointment. What may interact with this medicine? Do not take this medicine with any of the following medications:  biologic medicines such as adalimumab, certolizumab, etanercept, golimumab, infliximab This medicine may also interact with the following medications:  azathioprine  cyclosporine  interferons  6-mercaptopurine  methotrexate  other medicines that lower your chance of fighting an infection  steroid medicines like prednisone or  cortisone  vaccines This list may not describe all possible interactions. Give your health care provider a list of all the medicines, herbs, non-prescription drugs, or dietary supplements you use. Also tell them if you smoke, drink alcohol, or use illegal drugs. Some items may interact with your medicine. What should I watch for while using this medicine? Your condition will be monitored carefully while you are receiving this medicine. Visit your doctor for regular check ups. Tell your doctor or healthcare professional if your symptoms do not start to get better or if they get worse. Stay away from people who are sick. Call your doctor or health care professional for advice if you get a fever, chills or sore throat, or other symptoms of a cold or flu. Do not treat yourself. In some patients, this medicine may cause a serious brain infection that may cause death. If you have any problems seeing, thinking, speaking, walking, or standing, tell your doctor right away. If you cannot reach your doctor, get urgent medical care. What side effects may I notice from receiving this medicine? Side effects that you should report to your doctor or health care professional as soon as possible:  allergic reactions like skin rash, itching or hives, swelling of the face, lips, or tongue  breathing problems  changes in vision  chest pain  confusion  depressed mood  dizziness  feeling faint; lightheaded; falls  general ill feeling or flu-like symptoms  loss of memory  missed menstrual periods  muscle weakness  problems with balance, talking, or walking  signs and symptoms of liver injury like dark yellow or brown urine; general ill feeling or flu-like symptoms; light-colored  stools; loss of appetite; nausea; right upper belly pain; unusually weak or tired; yellowing of the eyes or skin  suicidal thoughts, mood changes  unusual bruising or bleeding  unusually weak or tired Side effects that  usually do not require medical attention (report to your doctor or health care professional if they continue or are bothersome):  headache  joint pain  muscle cramps  muscle pain  nausea, vomiting  pain, redness, or irritation at site where injected  tiredness This list may not describe all possible side effects. Call your doctor for medical advice about side effects. You may report side effects to FDA at 1-800-FDA-1088. Where should I keep my medicine? This drug is given in a hospital or clinic and will not be stored at home. NOTE: This sheet is a summary. It may not cover all possible information. If you have questions about this medicine, talk to your doctor, pharmacist, or health care provider.  2021 Elsevier/Gold Standard (2018-07-22 13:20:26)

## 2020-04-23 ENCOUNTER — Other Ambulatory Visit: Payer: Self-pay | Admitting: Neurology

## 2020-04-26 ENCOUNTER — Telehealth: Payer: Self-pay | Admitting: *Deleted

## 2020-04-26 ENCOUNTER — Other Ambulatory Visit: Payer: Self-pay | Admitting: *Deleted

## 2020-04-26 MED ORDER — PHENTERMINE HCL 37.5 MG PO CAPS: ORAL_CAPSULE | ORAL | 5 refills | Status: DC

## 2020-04-26 NOTE — Telephone Encounter (Signed)
Received the following message from [facility]:  "I called and scheduled appt. Patient said she didn't hear anything about her labs after last appt and would like a call back please. I told her it would probably not be today. "   I called pt back. Advised MD sent mychart message on 03/23/20: "The vitamin B12 was normal so you don't need shots." Also advised her JCV ab was negative. She verbalized understanding and appreciation.

## 2020-05-10 ENCOUNTER — Telehealth: Payer: Self-pay

## 2020-05-10 NOTE — Telephone Encounter (Signed)
Called pt - stated last 2 days she has tightness in her chest d/t coughing and covid negative as mentioned. No available appts tomorrow - informed to go to UC. Stated she will go tomorrow; instructed if symptoms worsen, to go today. Voiced understanding.

## 2020-05-10 NOTE — Telephone Encounter (Signed)
Pt is requesting a call back she stated that she is having chest pains due to her  Coughing a lot she stated that she also took a covid test which was negative

## 2020-05-11 ENCOUNTER — Other Ambulatory Visit: Payer: Self-pay

## 2020-05-11 ENCOUNTER — Encounter (HOSPITAL_BASED_OUTPATIENT_CLINIC_OR_DEPARTMENT_OTHER): Payer: Self-pay | Admitting: Emergency Medicine

## 2020-05-11 ENCOUNTER — Emergency Department (HOSPITAL_BASED_OUTPATIENT_CLINIC_OR_DEPARTMENT_OTHER)
Admission: EM | Admit: 2020-05-11 | Discharge: 2020-05-11 | Disposition: A | Discharge status: Home / Self Care | Payer: 59 | Attending: Emergency Medicine | Admitting: Emergency Medicine

## 2020-05-11 ENCOUNTER — Emergency Department (HOSPITAL_BASED_OUTPATIENT_CLINIC_OR_DEPARTMENT_OTHER): Payer: 59 | Admitting: Radiology

## 2020-05-11 DIAGNOSIS — N182 Chronic kidney disease, stage 2 (mild): Secondary | ICD-10-CM | POA: Insufficient documentation

## 2020-05-11 DIAGNOSIS — I509 Heart failure, unspecified: Secondary | ICD-10-CM | POA: Diagnosis not present

## 2020-05-11 DIAGNOSIS — R059 Cough, unspecified: Secondary | ICD-10-CM | POA: Diagnosis present

## 2020-05-11 DIAGNOSIS — J209 Acute bronchitis, unspecified: Secondary | ICD-10-CM | POA: Diagnosis not present

## 2020-05-11 MED ORDER — AZITHROMYCIN 250 MG PO TABS: 250.0000 mg | ORAL_TABLET | Freq: Every day | ORAL | 0 refills | Status: AC

## 2020-05-11 MED ORDER — ASPIRIN 81 MG PO CHEW: 324.0000 mg | CHEWABLE_TABLET | Freq: Once | ORAL | Status: DC

## 2020-05-11 MED ORDER — PREDNISONE 20 MG PO TABS
40.0000 mg | ORAL_TABLET | ORAL | Status: AC
  Administered 2020-05-11: 40 mg via ORAL
  Filled 2020-05-11: qty 2

## 2020-05-11 MED ORDER — PREDNISONE 20 MG PO TABS
20.0000 mg | ORAL_TABLET | Freq: Every day | ORAL | 0 refills | Status: DC
Start: 2020-05-11 — End: 2020-06-14

## 2020-05-11 MED ORDER — AZITHROMYCIN 250 MG PO TABS
500.0000 mg | ORAL_TABLET | Freq: Once | ORAL | Status: AC
  Administered 2020-05-11: 500 mg via ORAL
  Filled 2020-05-11: qty 2

## 2020-05-11 MED ORDER — BENZONATATE 100 MG PO CAPS
100.0000 mg | ORAL_CAPSULE | Freq: Once | ORAL | Status: AC
  Administered 2020-05-11: 100 mg via ORAL
  Filled 2020-05-11: qty 1

## 2020-05-11 MED ORDER — BENZONATATE 100 MG PO CAPS: 100.0000 mg | ORAL_CAPSULE | Freq: Three times a day (TID) | ORAL | 0 refills | Status: DC | PRN

## 2020-05-11 NOTE — ED Notes (Signed)
Back from Xray.

## 2020-05-11 NOTE — ED Triage Notes (Signed)
Pt  Stated that she has a a produce cough for the past 3 days with green mucous. Pt denies N/V but complains of generalized weakness.

## 2020-05-11 NOTE — Telephone Encounter (Signed)
I agree

## 2020-05-11 NOTE — ED Provider Notes (Signed)
[facility] EMERGENCY DEPT Provider Note   CSN: [id number] Arrival date & time: 05/11/20  1406     History Chief Complaint  Patient presents with  . Cough    Tamara [name] is a 60 y.o. female.  HPI Patient presents with concern of cough.  Cough began about 3 days ago, has been persistent since that time in spite of using Tylenol cough and cold, other OTC symptomatic relief attempts. No associated fever, no vomiting, no diarrhea, no pain.  There is some discomfort with coughing, but otherwise she has explicitly denying any pain. She is generally well, though she does have multiple medical issues including MS for which she takes chronic immunomodulatory Tysabri.  No steroids. She does not smoke, does not drink.  She has a notable sick contact of her granddaughter who is here with her currently.  Granddaughter has been seen and evaluated twice within the past few days, is currently on antibiotics for URI-like illness.    Past Medical History:  Diagnosis Date  . Anemia   . Bowel obstruction ([place])   . CKD (chronic kidney disease), stage II    based on labs  . GERD (gastroesophageal reflux disease)   . IBS (irritable bowel syndrome)    at age of 65  . Multiple sclerosis ([place])   . RBBB   . Sciatica 2009  . Vision abnormalities     Patient Active Problem List   Diagnosis Date Noted  . B12 deficiency 03/22/2020  . Left-sided chest wall pain 12/18/2019  . Spasticity 08/18/2019  . Sleep apnea 07/18/2019  . Heart failure ([place]) 07/09/2019  . Superficial phlebitis 06/27/2019  . Bilateral hand numbness 01/14/2019  . RLQ abdominal pain 10/22/2018  . Pedal edema 06/04/2018  . Gait disturbance 06/04/2018  . Bilateral knee pain 01/07/2018  . Left leg weakness 12/26/2017  . Conjunctivitis 10/30/2017  . Dental caries 07/30/2017  . Dyspnea on exertion 07/30/2017  . Morbid obesity ([place]) 12/18/2016  . High risk medication use 07/04/2016  . Multiple sclerosis ([place])  06/19/2016  . Neurogenic bowel   . Neurogenic bladder   . Transverse myelitis ([place]) 06/14/2016  . Allergic rhinitis 04/12/2016  . Sciatica associated with disorder of lumbosacral spine 01/15/2014  . GERD (gastroesophageal reflux disease) 07/21/2013  . RBBB on EKG 08/01/2012  . IBS (irritable bowel syndrome) 06/19/2012  . Healthcare maintenance 06/19/2012    Past Surgical History:  Procedure Laterality Date  . ABDOMINAL HYSTERECTOMY  2008   cervix and right ovary still intact  . KNEE ARTHROSCOPY  2010 and 2011   Left knee, x2     OB History    Gravida  2   Para  2   Term  2   Preterm  0   AB  0   Living  2     SAB  0   IAB  0   Ectopic  0   Multiple  0   Live Births              Family History  Problem Relation Age of Onset  . Cancer Father        Gallbladder  . Gallbladder disease Father   . Hypertension Brother   . Gallbladder disease Paternal Grandmother   . Colon cancer Neg Hx   . Colon polyps Neg Hx   . Esophageal cancer Neg Hx   . Kidney disease Neg Hx     Social History   Tobacco Use  . Smoking status: Never Smoker  .  Smokeless tobacco: Never Used  Substance Use Topics  . Alcohol use: No    Alcohol/week: 0.0 standard drinks  . Drug use: No    Home Medications Prior to Admission medications   Medication Sig Start Date End Date Taking? Authorizing Provider  acetaminophen (TYLENOL) 650 MG CR tablet Take 1,300 mg by mouth in the morning, at noon, and at bedtime.   Yes [provider]  Cholecalciferol (VITAMIN D-3) 25 MCG (1000 UT) CAPS Take 2,000 Units by mouth daily.   Yes [provider]  docusate sodium (COLACE) 100 MG capsule Take 1 capsule (100 mg total) by mouth daily. Patient taking differently: Take 100 mg by mouth daily as needed for mild constipation. 06/28/16  Yes Angiulli, Lavon Paganini, PA-C  fluticasone (FLONASE) 50 MCG/ACT nasal spray Place 2 sprays into both nostrils daily. 10/14/19  Yes Maudie Mercury, MD   loratadine (CLARITIN) 10 MG tablet Take 10 mg by mouth daily as needed for allergies or rhinitis.   Yes [provider]  Multiple Vitamins-Minerals (HAIR/SKIN/NAILS) CAPS Take 1 capsule by mouth in the morning, at noon, and at bedtime.   Yes [provider]  Multiple Vitamins-Minerals (ONE-A-DAY WOMENS PO) Take 1 tablet by mouth daily.   Yes [provider]  natalizumab (TYSABRI) 300 MG/15ML injection Inject 300 mg into the vein every 30 (thirty) days.   Yes [provider]  Omega-3 Fatty Acids (FISH OIL) 1000 MG CAPS Take 1,000-2,000 mg by mouth See admin instructions. Take 2,000 mg by mouth in the morning and 1,000 mg at bedtime   Yes [provider]  pantoprazole (PROTONIX) 40 MG tablet Take 1 tablet by mouth once daily 12/11/19  Yes Lucious Groves, DO  phentermine 37.5 MG capsule Take 1 capsule by mouth once daily in the morning 04/26/20  Yes Sater, Nanine Means, MD  topiramate (TOPAMAX) 50 MG tablet Take 1 tablet (50 mg total) by mouth 3 (three) times daily. 03/22/20  Yes Sater, Nanine Means, MD  traMADol (ULTRAM) 50 MG tablet TAKE 1 TABLET BY MOUTH TWICE DAILY AS NEEDED FOR SEVERE PAIN Patient taking differently: Take 50 mg by mouth 2 (two) times daily as needed for severe pain. 02/17/19  Yes Sater, Nanine Means, MD  ALPRAZolam Duanne Moron) 1 MG tablet Take 1-2 tablets thirty minutes prior to MRI.  May take one additional tablet before entering scanner, if needed.  MUST HAVE DRIVER. 07/14/19   Sater, Nanine Means, MD  diclofenac Sodium (VOLTAREN) 1 % GEL Apply 2 g topically 4 (four) times daily. Patient not taking: No sig reported 12/17/19   Asencion Noble, MD    Allergies    Codeine, Dilaudid [hydromorphone hcl], Ditropan [oxybutynin], Other, Paxil [paroxetine hcl], Tegretol [carbamazepine], Trileptal [oxcarbazepine], Adhesive [tape], Augmentin [amoxicillin-pot clavulanate], and Gabapentin  Review of Systems   Review of Systems  Constitutional:       Per  HPI, otherwise negative  HENT:       Per HPI, otherwise negative  Respiratory:       Per HPI, otherwise negative  Cardiovascular:       Per HPI, otherwise negative  Gastrointestinal: Negative for vomiting.  Endocrine:       Negative aside from HPI  Genitourinary:       Neg aside from HPI   Musculoskeletal:       Per HPI, otherwise negative  Skin: Negative.   Allergic/Immunologic: Positive for immunocompromised state.  Neurological: Negative for syncope and weakness.    Physical Exam Updated Vital Signs  BP (!) 119/96   Pulse 82   Temp 99.6 F (37.6 C) (Oral)   Resp 18   Ht 5\' 3"  (1.6 m)   Wt 89.8 kg   SpO2 100%   BMI 35.07 kg/m   Physical Exam Vitals and nursing note reviewed.  Constitutional:      General: She is not in acute distress.    Appearance: She is well-developed.  HENT:     Head: Normocephalic and atraumatic.  Eyes:     Conjunctiva/sclera: Conjunctivae normal.  Cardiovascular:     Rate and Rhythm: Normal rate and regular rhythm.  Pulmonary:     Effort: Pulmonary effort is normal. No respiratory distress.     Breath sounds: Normal breath sounds. No stridor.  Abdominal:     General: There is no distension.  Skin:    General: Skin is warm and dry.  Neurological:     Mental Status: She is alert and oriented to person, place, and time.     Cranial Nerves: No cranial nerve deficit.     ED Results / Procedures / Treatments    Radiology DG Chest 2 View  Result Date: 05/11/2020 CLINICAL DATA:  Cough. EXAM: CHEST - 2 VIEW COMPARISON:  03/02/2020 FINDINGS: Low lung volumes.The cardiomediastinal contours are normal. Mild bibasilar atelectasis. Pulmonary vasculature is normal. No consolidation, pleural effusion, or pneumothorax. No acute osseous abnormalities are seen. IMPRESSION: Low lung volumes with mild bibasilar atelectasis. Electronically Signed   By: Keith Rake M.D.   On: 05/11/2020 14:58    Procedures Procedures   Medications Ordered in  ED Medications  azithromycin (ZITHROMAX) tablet 500 mg (has no administration in time range)  predniSONE (DELTASONE) tablet 40 mg (has no administration in time range)  benzonatate (TESSALON) capsule 100 mg (has no administration in time range)    ED Course  I have reviewed the triage vital signs and the nursing notes.  Pertinent labs & imaging results that were available during my care of the patient were reviewed by me and considered in my medical decision making (see chart for details).   2:43 PM   Patient in no distress.  I reviewed the x-ray, discussed with her, no evidence for pneumonia, low suspicion for bronchitis, given her daughter's similar condition, she will start short course of antibiotics.  Given her relatively immunocompromised state, persistent cough, MS, patient will have a short course of antibiotics, Tessalon Perles. Here no evidence for hypoxia, no respiratory distress, no hypotension, no evidence for bacteremia, sepsis.  Patient appropriate for discharge with close outpatient follow-up.  Final Clinical Impression(s) / ED Diagnoses Final diagnoses:  Acute bronchitis, unspecified organism     Carmin Muskrat, MD 05/11/20 1502

## 2020-05-11 NOTE — Discharge Instructions (Addendum)
Please take all medication as directed and follow-up with your physician.  Return here for concerning changes in your condition.

## 2020-05-13 ENCOUNTER — Other Ambulatory Visit: Payer: Self-pay

## 2020-05-13 ENCOUNTER — Non-Acute Institutional Stay (HOSPITAL_COMMUNITY)
Admission: RE | Admit: 2020-05-13 | Discharge: 2020-05-13 | Disposition: A | Discharge status: Home / Self Care | Payer: 59 | Source: Ambulatory Visit | Attending: Internal Medicine | Admitting: Internal Medicine

## 2020-05-13 DIAGNOSIS — G35 Multiple sclerosis: Secondary | ICD-10-CM | POA: Insufficient documentation

## 2020-05-13 MED ORDER — ACETAMINOPHEN 325 MG PO TABS: 650.0000 mg | ORAL_TABLET | ORAL | Status: DC | PRN

## 2020-05-13 MED ORDER — SODIUM CHLORIDE 0.9 % IV SOLN
INTRAVENOUS | Status: DC | PRN
  Administered 2020-05-13: 250 mL via INTRAVENOUS

## 2020-05-13 MED ORDER — SODIUM CHLORIDE 0.9 % IV SOLN
300.0000 mg | INTRAVENOUS | Status: DC
  Administered 2020-05-13: 300 mg via INTRAVENOUS
  Filled 2020-05-13: qty 15

## 2020-05-13 MED ORDER — DIPHENHYDRAMINE HCL 25 MG PO CAPS: 25.0000 mg | ORAL_CAPSULE | ORAL | Status: DC | PRN

## 2020-05-13 NOTE — Progress Notes (Signed)
CENTER NOTE   Diagnosis: Multiple Sclerosis       Provider: Arlice Colt, MD     Procedure: Fritzi Mandes IV     Note: Patient received Tysabri infusion via PIV. Tolerated infusion well with no adverse reaction. Vital signs stable. Pt declined AVS. Patient stayed for observation for approximately 40 minutes after infusion, states she feels fine and does not want to stay for full hour. Instructed pt to make next infusion appointment at front desk, verbalized understanding. Alert, oriented and ambulatory at discharge.

## 2020-05-13 NOTE — Discharge Instructions (Signed)
Natalizumab injection What is this medicine? NATALIZUMAB (na ta LIZ you mab) is used to treat relapsing multiple sclerosis. This drug is not a cure. It is also used to treat Crohn's disease. This medicine may be used for other purposes; ask your health care provider or pharmacist if you have questions. COMMON BRAND NAME(S): Tysabri What should I tell my health care provider before I take this medicine? They need to know if you have any of these conditions:  immune system problems  progressive multifocal leukoencephalopathy (PML)  an unusual or allergic reaction to natalizumab, other medicines, foods, dyes, or preservatives  pregnant or trying to get pregnant  breast-feeding How should I use this medicine? This medicine is for infusion into a vein. It is given by a health care professional in a hospital or clinic setting. A special MedGuide will be given to you by the pharmacist with each prescription and refill. Be sure to read this information carefully each time. Talk to your pediatrician regarding the use of this medicine in children. This medicine is not approved for use in children. Overdosage: If you think you have taken too much of this medicine contact a poison control center or emergency room at once. NOTE: This medicine is only for you. Do not share this medicine with others. What if I miss a dose? It is important not to miss your dose. Call your doctor or health care professional if you are unable to keep an appointment. What may interact with this medicine? Do not take this medicine with any of the following medications:  biologic medicines such as adalimumab, certolizumab, etanercept, golimumab, infliximab This medicine may also interact with the following medications:  azathioprine  cyclosporine  interferons  6-mercaptopurine  methotrexate  other medicines that lower your chance of fighting an infection  steroid medicines like prednisone or  cortisone  vaccines This list may not describe all possible interactions. Give your health care provider a list of all the medicines, herbs, non-prescription drugs, or dietary supplements you use. Also tell them if you smoke, drink alcohol, or use illegal drugs. Some items may interact with your medicine. What should I watch for while using this medicine? Your condition will be monitored carefully while you are receiving this medicine. Visit your doctor for regular check ups. Tell your doctor or healthcare professional if your symptoms do not start to get better or if they get worse. Stay away from people who are sick. Call your doctor or health care professional for advice if you get a fever, chills or sore throat, or other symptoms of a cold or flu. Do not treat yourself. In some patients, this medicine may cause a serious brain infection that may cause death. If you have any problems seeing, thinking, speaking, walking, or standing, tell your doctor right away. If you cannot reach your doctor, get urgent medical care. What side effects may I notice from receiving this medicine? Side effects that you should report to your doctor or health care professional as soon as possible:  allergic reactions like skin rash, itching or hives, swelling of the face, lips, or tongue  breathing problems  changes in vision  chest pain  confusion  depressed mood  dizziness  feeling faint; lightheaded; falls  general ill feeling or flu-like symptoms  loss of memory  missed menstrual periods  muscle weakness  problems with balance, talking, or walking  signs and symptoms of liver injury like dark yellow or brown urine; general ill feeling or flu-like symptoms; light-colored  stools; loss of appetite; nausea; right upper belly pain; unusually weak or tired; yellowing of the eyes or skin  suicidal thoughts, mood changes  unusual bruising or bleeding  unusually weak or tired Side effects that  usually do not require medical attention (report to your doctor or health care professional if they continue or are bothersome):  headache  joint pain  muscle cramps  muscle pain  nausea, vomiting  pain, redness, or irritation at site where injected  tiredness This list may not describe all possible side effects. Call your doctor for medical advice about side effects. You may report side effects to FDA at 1-800-FDA-1088. Where should I keep my medicine? This drug is given in a hospital or clinic and will not be stored at home. NOTE: This sheet is a summary. It may not cover all possible information. If you have questions about this medicine, talk to your doctor, pharmacist, or health care provider.  2021 Elsevier/Gold Standard (2018-07-22 13:20:26)

## 2020-05-17 ENCOUNTER — Telehealth: Payer: Self-pay | Admitting: Neurology

## 2020-05-17 DIAGNOSIS — G35 Multiple sclerosis: Secondary | ICD-10-CM

## 2020-05-17 NOTE — Telephone Encounter (Signed)
Pt LVM, having complications, difficulty with hands and feet drawing up. I may have has a relapse. I has my treatment 05/13/20. Would like a call from the nurse.

## 2020-05-17 NOTE — Telephone Encounter (Signed)
Called patient back to further discuss. She received Tysabri infusion 05/13/20. Since her infusion on 04/16/20, she is having trouble holding items, hands are numb, dropping items. Muscle tone is different. She is incontinent of bowel intermittently. Having to wear briefs now. This is new for her. She is unaware that she has had a bowel movement most times until she goes to urinate and finds BM. I scheduled work in visit for 05/19/20 at 2:30pm for her to see Dr. Felecia Shelling. Advised her to check in by 200pm. She verbalized understanding.

## 2020-05-19 ENCOUNTER — Ambulatory Visit: Payer: Self-pay | Admitting: Neurology

## 2020-05-20 ENCOUNTER — Telehealth: Payer: Self-pay | Admitting: *Deleted

## 2020-05-20 ENCOUNTER — Encounter: Payer: 59 | Admitting: Internal Medicine

## 2020-05-20 NOTE — Telephone Encounter (Signed)
Pt did not come to her appt. Pt was called - no answer; "mailbox full", unable to leave a message.

## 2020-06-10 ENCOUNTER — Telehealth: Payer: Self-pay | Admitting: Neurology

## 2020-06-10 NOTE — Addendum Note (Signed)
Addended by: Wyvonnia Lora on: 06/10/2020 11:23 AM   Modules accepted: Orders

## 2020-06-10 NOTE — Telephone Encounter (Signed)
[facility] community auth: [id number] (exp. 06/10/20 to 07/25/20) order sent to GI. They will reach out to the patient to schedule.

## 2020-06-10 NOTE — Telephone Encounter (Signed)
Called pt. Advised we received message from [place] to make sure it was ok to infuse her Tysbari tomorrow given sx she has reported/having to r/s appt. Advised per Dr. Felecia Shelling, ok for her to get infused. He would also like her to get MRI brain w/wo to check for any changes. Pt ok w/ going to [facility]. I placed order. She will keep follow up for 06/14/20 at 9am w/ Dr. Felecia Shelling, check in 830am.

## 2020-06-11 ENCOUNTER — Other Ambulatory Visit: Payer: Self-pay

## 2020-06-11 ENCOUNTER — Encounter (HOSPITAL_COMMUNITY): Payer: 59

## 2020-06-11 ENCOUNTER — Ambulatory Visit (HOSPITAL_COMMUNITY)
Admission: RE | Admit: 2020-06-11 | Discharge: 2020-06-11 | Disposition: A | Discharge status: Home / Self Care | Payer: Medicaid Other | Source: Ambulatory Visit | Attending: Internal Medicine | Admitting: Internal Medicine

## 2020-06-11 DIAGNOSIS — G35 Multiple sclerosis: Secondary | ICD-10-CM | POA: Diagnosis not present

## 2020-06-11 MED ORDER — SODIUM CHLORIDE 0.9 % IV SOLN
300.0000 mg | INTRAVENOUS | Status: DC
  Administered 2020-06-11: 300 mg via INTRAVENOUS
  Filled 2020-06-11: qty 15

## 2020-06-11 MED ORDER — SODIUM CHLORIDE 0.9 % IV SOLN
INTRAVENOUS | Status: DC | PRN
  Administered 2020-06-11: 250 mL via INTRAVENOUS

## 2020-06-11 MED ORDER — ACETAMINOPHEN 325 MG PO TABS: 650.0000 mg | ORAL_TABLET | ORAL | Status: DC | PRN

## 2020-06-11 MED ORDER — DIPHENHYDRAMINE HCL 25 MG PO CAPS: 25.0000 mg | ORAL_CAPSULE | ORAL | Status: DC | PRN

## 2020-06-11 NOTE — Progress Notes (Signed)
PATIENT CARE CENTER NOTE    Diagnosis: Multiple Sclerosis G35     Provider: Sater, Richard, MD     Procedure: Tysabri infusion      Note: Patient received Tysabri infusion via PIV. Tolerated well with no adverse reaction.  Patient declined 1 hour observation.  Vital signs stable. AVS offered but patient refused. Patient alert, oriented and ambulatory at discharge. 

## 2020-06-12 ENCOUNTER — Other Ambulatory Visit: Payer: Medicaid Other

## 2020-06-14 ENCOUNTER — Ambulatory Visit: Payer: Medicaid Other | Admitting: Neurology

## 2020-06-14 ENCOUNTER — Ambulatory Visit: Payer: Self-pay | Admitting: Neurology

## 2020-06-14 ENCOUNTER — Encounter: Payer: Self-pay | Admitting: Neurology

## 2020-06-14 ENCOUNTER — Telehealth: Payer: Self-pay | Admitting: Neurology

## 2020-06-14 VITALS — BP 125/82 | HR 74 | Ht 63.0 in | Wt 191.0 lb

## 2020-06-14 DIAGNOSIS — R269 Unspecified abnormalities of gait and mobility: Secondary | ICD-10-CM

## 2020-06-14 DIAGNOSIS — G35 Multiple sclerosis: Secondary | ICD-10-CM

## 2020-06-14 DIAGNOSIS — E559 Vitamin D deficiency, unspecified: Secondary | ICD-10-CM | POA: Diagnosis not present

## 2020-06-14 DIAGNOSIS — Z79899 Other long term (current) drug therapy: Secondary | ICD-10-CM | POA: Diagnosis not present

## 2020-06-14 DIAGNOSIS — N319 Neuromuscular dysfunction of bladder, unspecified: Secondary | ICD-10-CM | POA: Diagnosis not present

## 2020-06-14 MED ORDER — VITAMIN D (ERGOCALCIFEROL) 1.25 MG (50000 UNIT) PO CAPS: 50000.0000 [IU] | ORAL_CAPSULE | ORAL | 0 refills | Status: DC

## 2020-06-14 MED ORDER — HYOSCYAMINE SULFATE 0.125 MG PO TABS: 0.1250 mg | ORAL_TABLET | Freq: Three times a day (TID) | ORAL | 5 refills | Status: DC

## 2020-06-14 MED ORDER — NITRILE GLOVES MEDIUM MISC: 5 refills | Status: DC

## 2020-06-14 NOTE — Progress Notes (Signed)
[facility]  PATIENT: Tamara [name] DOB: [birth year]  REFERRING DOCTOR OR PCP:  Nadean Corwin SOURCE: Patient, notes from emergency room and recent hospital stay ([facility]), imaging and lab reports, MRI images on PACS.  _________________________________   HISTORICAL  CHIEF COMPLAINT:  Chief Complaint  Patient presents with  . Follow-up    RM 12, alone.    HISTORY OF PRESENT ILLNESS:  Tamara [name] Is a 60 y.o. woman with relapsing remitting multiple sclerosis in May 2018 after she presented with transverse myelitis.   Update 06/14/2020: She is on Tysabri (last infusion 06/09/2020).   She is tolerating the infusions well  JCV Ab 03/22/2020 was 0.16 negative.  She denies any exacerbations.     She is noting more numbness in her groin and has had some urinary and bowel incontinence.   She notes shock like sensations in her hands seem worse.   Sometimes lifting or reaching triggers incontinence.      She feels balance and gait seem worse off/on.  She sleeps worse the last week of each cycle and generally feels more tired with more neurologic symptoms at that time.     She has numbness and dysesthesias in her hands, and she often wears gloves.   She notes hand symptoms more when she makes a fist or squeezes.   She takes tramadol pain is worse.     She could not tolerate gabapentin, Lyrica, lamotrigine and oxcarbazepine.   She had ankle swelling with nortriptyline.  NCV/EMG was fine so sensory symptoms likely from spinal cord plaques.     She has more stress going through a divorce now.     She is on topiramate for headaches with benefit.   She has fatigue.  She still takes phentermine    She has had a vit D deficiency and takes D3.   She has lost 9 pounds ad is trying to eat better.      MS History:   On 06/10/16, she had the onset of numbness and clumsiness in her legs.   As the day went on, her hands also became numb and clumsy.  She felt numb from her waist down.    She went to  the [facility] and had an xtay of her back and was referred to orthopedics     Two days later, she went to the ED when her symptoms worsened and she was seen and discharged.    She saw her internist who ordered a lumbar MRI.  The lumbar MRI showed degenerative changes that were mild at L4-L5 and the study was otherwise fairly normal.   She was referred to the ED and had an MRI of the brain, cervical spine and thoracic spine.    She was found to have a transverse myelitis in the cervical spine with an enhancing lesion in the posterior columns. There was also a smaller non-enhancing focus at C5   She also had 5-6 spots in her brain, the largest being periventricular on the right.  She was admitted to [facility] and had 3 days of IV Solumedrol followed by an oral taper.    She was transferred to rehabilitation and was discharged 5/30.   She walked out and felt much better.   Additionally, while she was in the hospital (06/15/2016) she had a lumbar puncture. The CSF was abnormal showing showing the presence  4 oligoclonal bands and an elevated IgG index of 0.9 (less than 0.7 is normal). The myelin  basic protein was mildly elevated at 2.2.      She had another exacerbation after returning home with left leg dragging and feeling more off balance with stumbling.   In retrospect, in 2017, she had an episode lasting a few weeks where she felt she was dragging her left leg some. This completely resolved and she did not have any other symptoms such as numbness at that time.   MRIs of the brain, cervical spine, thoracic spine and lumbar spine were personally reviewed. The MRI of the cervical spine shows an enhancing lesion posterior spinal cord adjacent to C2-C3. Additionally there is a nonenhancing focus adjacent to C5. In the brain, there are several T2/FLAIR hyperintense foci and the largest is in the right parietal lobe and there are 4 or 5 other small foci, one in the periventricular white matter of the  right frontal lobe and the rest in the subcortical white matter.    Laboratory studies show 4 oligoclonal bands and an elevated IgG index of 0.9. Myelin basic protein was mildly elevated at 2.2. She had elevated glucose between 120 and 140 several times while getting steroids.   Her Vit D was mildly low (25.6) and she just started OTC supplementation.   She started Tysabri in July 2018  IMAGING  MRIs of the brain and cervical spine 07/16/2019 compared to the 2019 brain and 2018 cervical spine .  There are no new lesions. She has old cervical spine lesions at C3 and C5.  The brain shows multiple chronic foci in the hemispheres.  None in brainstem or cerebellum.  No focus enhances.       REVIEW OF SYSTEMS: Constitutional: No fevers, chills, sweats, or change in appetite Eyes: No visual changes, double vision, eye pain Ear, nose and throat: No hearing loss, ear pain, nasal congestion, sore throat Cardiovascular: No chest pain, palpitations Respiratory: No shortness of breath at rest or with exertion.   No wheezes GastrointestinaI: No nausea, vomiting, diarrhea, abdominal pain, fecal incontinence Genitourinary: No dysuria, urinary retention or frequency.  No nocturia. Musculoskeletal: No neck pain, back pain Integumentary: No rash, pruritus, skin lesions Neurological: as above Psychiatric: No depression at this time.  No anxiety Endocrine: No palpitations, diaphoresis, change in appetite, change in weigh or increased thirst Hematologic/Lymphatic: No anemia, purpura, petechiae. Allergic/Immunologic: No itchy/runny eyes, nasal congestion, recent allergic reactions, rashes  ALLERGIES: Allergies  Allergen Reactions  . Codeine Other (See Comments)    Delusions  . Dilaudid [Hydromorphone Hcl] Swelling and Other (See Comments)    Tongue swells   . Ditropan [Oxybutynin] Other (See Comments)    Burning sensation  . Other Swelling and Other (See Comments)    Unnamed gel or antiseptic solution-  Was applied to IV site with a needle- Turned the skin "black and blue" that remained (caused burning and phlebitis, also)  . Paxil [Paroxetine Hcl] Other (See Comments)    Hallucinations and heavy periods   . Tegretol [Carbamazepine] Itching  . Trileptal [Oxcarbazepine] Swelling  . Adhesive [Tape] Hives, Itching, Rash and Other (See Comments)    PAPER TAPE  . Augmentin [Amoxicillin-Pot Clavulanate] Rash  . Gabapentin Rash and Other (See Comments)    Hallucinations and depression, also    HOME MEDICATIONS:  Current Outpatient Medications:  .  acetaminophen (TYLENOL) 650 MG CR tablet, Take 1,300 mg by mouth in the morning, at noon, and at bedtime., Disp: , Rfl:  .  benzonatate (TESSALON) 100 MG capsule, Take 1 capsule (100 mg total) by mouth  3 (three) times daily as needed for cough., Disp: 21 capsule, Rfl: 0 .  Disposable Gloves (NITRILE GLOVES MEDIUM) MISC, Use prn for bowel incontinence, Disp: 100 each, Rfl: 5 .  docusate sodium (COLACE) 100 MG capsule, Take 1 capsule (100 mg total) by mouth daily. (Patient taking differently: Take 100 mg by mouth daily as needed for mild constipation.), Disp: 10 capsule, Rfl: 0 .  fluticasone (FLONASE) 50 MCG/ACT nasal spray, Place 2 sprays into both nostrils daily., Disp: 16 g, Rfl: 2 .  hyoscyamine (LEVSIN) 0.125 MG tablet, Take 1 tablet (0.125 mg total) by mouth 3 (three) times daily., Disp: 90 tablet, Rfl: 5 .  loratadine (CLARITIN) 10 MG tablet, Take 10 mg by mouth daily as needed for allergies or rhinitis., Disp: , Rfl:  .  Multiple Vitamins-Minerals (HAIR/SKIN/NAILS) CAPS, Take 1 capsule by mouth in the morning, at noon, and at bedtime., Disp: , Rfl:  .  Multiple Vitamins-Minerals (ONE-A-DAY WOMENS PO), Take 1 tablet by mouth daily., Disp: , Rfl:  .  natalizumab (TYSABRI) 300 MG/15ML injection, Inject 300 mg into the vein every 30 (thirty) days., Disp: , Rfl:  .  Omega-3 Fatty Acids (FISH OIL) 1000 MG CAPS, Take 1,000-2,000 mg by mouth See admin  instructions. Take 2,000 mg by mouth in the morning and 1,000 mg at bedtime, Disp: , Rfl:  .  pantoprazole (PROTONIX) 40 MG tablet, Take 1 tablet by mouth once daily, Disp: 90 tablet, Rfl: 1 .  phentermine 37.5 MG capsule, Take 1 capsule by mouth once daily in the morning, Disp: 30 capsule, Rfl: 5 .  topiramate (TOPAMAX) 50 MG tablet, Take 1 tablet (50 mg total) by mouth 3 (three) times daily., Disp: 270 tablet, Rfl: 3 .  traMADol (ULTRAM) 50 MG tablet, TAKE 1 TABLET BY MOUTH TWICE DAILY AS NEEDED FOR SEVERE PAIN (Patient taking differently: Take 50 mg by mouth 2 (two) times daily as needed for severe pain.), Disp: 60 tablet, Rfl: 0 .  Vitamin D, Ergocalciferol, (DRISDOL) 1.25 MG (50000 UNIT) CAPS capsule, Take 1 capsule (50,000 Units total) by mouth every 7 (seven) days., Disp: 13 capsule, Rfl: 0 .  ALPRAZolam (XANAX) 1 MG tablet, Take 1-2 tablets thirty minutes prior to MRI.  May take one additional tablet before entering scanner, if needed.  MUST HAVE DRIVER. (Patient not taking: Reported on 06/14/2020), Disp: 3 tablet, Rfl: 0  PAST MEDICAL HISTORY: Past Medical History:  Diagnosis Date  . Anemia   . Bowel obstruction (HCC)   . CKD (chronic kidney disease), stage II    based on labs  . GERD (gastroesophageal reflux disease)   . IBS (irritable bowel syndrome)    at age of 69  . Multiple sclerosis (HCC)   . RBBB   . Sciatica 2009  . Vision abnormalities     PAST SURGICAL HISTORY: Past Surgical History:  Procedure Laterality Date  . ABDOMINAL HYSTERECTOMY  2008   cervix and right ovary still intact  . KNEE ARTHROSCOPY  2010 and 2011   Left knee, x2    FAMILY HISTORY: Family History  Problem Relation Age of Onset  . Cancer Father        Gallbladder  . Gallbladder disease Father   . Hypertension Brother   . Gallbladder disease Paternal Grandmother   . Colon cancer Neg Hx   . Colon polyps Neg Hx   . Esophageal cancer Neg Hx   . Kidney disease Neg Hx     SOCIAL  HISTORY:  Social History  Socioeconomic History  . Marital status: Married    Spouse name: Not on file  . Number of children: 2  . Years of education: Not on file  . Highest education level: Not on file  Occupational History  . Occupation: Science writer  Tobacco Use  . Smoking status: Never Smoker  . Smokeless tobacco: Never Used  Substance and Sexual Activity  . Alcohol use: No    Alcohol/week: 0.0 standard drinks  . Drug use: No  . Sexual activity: Not on file  Other Topics Concern  . Not on file  Social History Narrative  . Not on file   Social Determinants of Health   Financial Resource Strain: Not on file  Food Insecurity: Not on file  Transportation Needs: Not on file  Physical Activity: Not on file  Stress: Not on file  Social Connections: Not on file  Intimate Partner Violence: Not on file     PHYSICAL EXAM  Vitals:   06/14/20 1408  BP: 125/82  Pulse: 74  SpO2: 98%  Weight: 191 lb (86.6 kg)  Height: 5\' 3"  (1.6 m)    Body mass index is 33.83 kg/m.   General: The patient is well-developed and well-nourished and in no acute distress   Neurologic Exam  Mental status: The patient is alert and oriented x 3 at the time of the examination. The patient has apparent normal recent and remote memory, with an apparently normal attention span and concentration ability.   Speech is normal.  Cranial nerves: Extraocular movements are full. Facial strength and sensation is normal. Trapezius strength is normal.. Hearing appears to be symmetric.  Motor:  Muscle bulk is normal.   Muscle tone is normal. Strength is 5/5 except 4+/5 hip flexion (iliopsoas).      Sensory: Touch, temperature and vibration sensation were normal and symmetric in the arms and she reported mildly reduced vibration sensation in the left leg.  Coordination: Cerebellar testing reveals good finger-nose-finger and normal heel-to-shin bilaterally.  Gait and station: Station is normal.   Gait is mildly wide.  Tandem gait is poor.  Left foot drop.  Romberg is negative.    Reflexes:  . Deep tendon reflexes are 3 and symmetric in the knees and ankles and 2 and symmetric in the arms.      DIAGNOSTIC DATA (LABS, IMAGING, TESTING) - I reviewed patient records, labs, notes, testing and imaging myself where available.  Lab Results  Component Value Date   WBC 6.7 03/22/2020   HGB 11.8 03/22/2020   HCT 36.7 03/22/2020   MCV 88 03/22/2020   PLT 175 03/22/2020      Component Value Date/Time   NA 141 07/10/2019 0519   NA 144 10/22/2018 1658   K 3.4 (L) 07/10/2019 0519   CL 112 (H) 07/10/2019 0519   CO2 23 07/10/2019 0519   GLUCOSE 103 (H) 07/10/2019 0519   BUN 17 07/10/2019 0519   BUN 13 10/22/2018 1658   CREATININE 1.13 (H) 07/10/2019 0519   CREATININE 1.01 07/08/2013 1440   CALCIUM 8.6 (L) 07/10/2019 0519   PROT 6.2 (L) 07/09/2019 1956   PROT 6.4 10/22/2018 1658   ALBUMIN 3.8 07/09/2019 1956   ALBUMIN 4.4 10/22/2018 1658   AST 16 07/09/2019 1956   ALT 15 07/09/2019 1956   ALKPHOS 59 07/09/2019 1956   BILITOT 0.5 07/09/2019 1956   BILITOT <0.2 10/22/2018 1658   GFRNONAA 54 (L) 07/10/2019 0519   GFRNONAA 64 07/08/2013 1440   GFRAA >60 07/10/2019 4403  GFRAA 74 07/08/2013 1440   No results found for: CHOL, HDL, LDLCALC, LDLDIRECT, TRIG, CHOLHDL No results found for: MWUX3K Lab Results  Component Value Date   VITAMINB12 1,059 03/22/2020   Lab Results  Component Value Date   TSH 0.351 06/15/2016       ASSESSMENT AND PLAN  Multiple sclerosis (HCC)  High risk medication use  Gait disturbance  Neurogenic bladder  Vitamin D deficiency   1.   Continue Tysabri.. Due to more bowel/bladder issues, we will check MRI brain/cerv spine and consider a change in therapy if new lesions.   If she converts from negative to high positive we will need to consider a different disease modifying therapy 2.   She will continue phentermine for fatigue/weight and  Topamax for migraine 3.   Stay active and exercise.     4.   Vit D supplementation.   Hyoscyamine for bowel/bladder issues 5.   RTC 6  months.   She is advised to call sooner if she has any new or worsening neurologic symptoms.  47-minute office visit with the majority of the time spent face-to-face for history and physical, discussion/counseling and decision-making.  Additional time with record review and documentation.  Takesha Steger A. Epimenio Foot, MD, PhD, [place]  06/14/2020, 2:40 PM Certified in Neurology, Clinical Neurophysiology, Sleep Medicine, Pain Medicine and Neuroimaging Dir., [facility]  [facility] [address], [address] [place], [place] [address] [phone]

## 2020-06-14 NOTE — Telephone Encounter (Signed)
Tried calling pt since she is not here yet for her 2pm appt. Went to [employer], mailbox full, unable to leave message.

## 2020-06-14 NOTE — Telephone Encounter (Signed)
Called pt. Advised I spoke w/ MD. Tamara [name] to work her back in at 2pm or 330pm this afternoon. Pt accepted 2pm. I scheduled her and advised for her to check in at 130pm. She verbalized understanding.

## 2020-06-14 NOTE — Telephone Encounter (Signed)
Pt called very irate demanding to speak to the provider. Pt states she came to today's appt and was not able to be seen due to due to being late. Pt states she informed office that she can not make early morning appts but she was "forced" to take this appt. Pt was informed that a note will be sent to the RN to call her back to discuss the appt. Pt started yelling saying she does not want to speak to no nurse that she asked to speak to the Provider. I informed her that the protocol is to message the nurse and then she will discuss with Provider. Pt yelled again I didn't ask you to send it to the nurse, I need to speak to my doctor and I need him to call me today. Pt continued being nasty and rude stating that the front office staff did not understand that she does not sleep at night so that is why she can not make early morning appt. I informed pt once again that the note will be sent, I wished her a good day and that I will be disconnecting the phone call.

## 2020-06-14 NOTE — Telephone Encounter (Signed)
Pt showed up at 2:04pm for appt. Dr. Felecia Shelling agreed to see her. I did have conversation with pt letting her know importance of coming to appt 12min early to allow time for check in process. Pt verbalized understanding. She states she did not have ride and having bowel/bladder issues and that is why she was late.

## 2020-06-24 ENCOUNTER — Other Ambulatory Visit: Payer: Medicaid Other

## 2020-07-03 ENCOUNTER — Other Ambulatory Visit: Payer: Self-pay | Admitting: Neurology

## 2020-07-09 ENCOUNTER — Encounter (HOSPITAL_COMMUNITY): Payer: Medicaid Other

## 2020-07-27 ENCOUNTER — Telehealth: Payer: Self-pay | Admitting: *Deleted

## 2020-07-27 NOTE — Telephone Encounter (Signed)
Faxed completed/signed Tysabri pt status report and reauth questionnaire to [facility] touch at [phone]. Received confirmation.   Received fax notification that pt is re-auth for Tysabri via touch program 07/26/20-02/25/21. Account: [place]. Site auth number: [id number]. Pt enrollment number: [id number].

## 2020-07-28 ENCOUNTER — Inpatient Hospital Stay (HOSPITAL_COMMUNITY): Admission: RE | Admit: 2020-07-28 | Payer: Medicaid Other | Source: Ambulatory Visit

## 2020-07-29 ENCOUNTER — Other Ambulatory Visit: Payer: Self-pay

## 2020-07-29 ENCOUNTER — Non-Acute Institutional Stay (HOSPITAL_COMMUNITY)
Admission: RE | Admit: 2020-07-29 | Discharge: 2020-07-29 | Disposition: A | Discharge status: Home / Self Care | Payer: Medicaid Other | Source: Ambulatory Visit | Attending: Internal Medicine | Admitting: Internal Medicine

## 2020-07-29 DIAGNOSIS — G35 Multiple sclerosis: Secondary | ICD-10-CM | POA: Diagnosis not present

## 2020-07-29 MED ORDER — SODIUM CHLORIDE 0.9 % IV SOLN
300.0000 mg | INTRAVENOUS | Status: DC
  Administered 2020-07-29: 300 mg via INTRAVENOUS
  Filled 2020-07-29: qty 15

## 2020-07-29 MED ORDER — SODIUM CHLORIDE 0.9 % IV SOLN
INTRAVENOUS | Status: DC | PRN
  Administered 2020-07-29: 250 mL via INTRAVENOUS

## 2020-07-29 NOTE — Progress Notes (Signed)
PATIENT CARE CENTER NOTE   Diagnosis: Multiple Sclerosis G35     Provider: Arlice Colt, MD     Procedure: Tysabri infusion      Note: Patient received Tysabri infusion via PIV. Tolerated well with no adverse reaction.  Patient declined 1 hour observation.  Vital signs stable. AVS offered but patient refused. Patient alert, oriented and ambulatory at discharge.

## 2020-08-03 ENCOUNTER — Encounter: Payer: Self-pay | Admitting: *Deleted

## 2020-08-27 ENCOUNTER — Other Ambulatory Visit: Payer: Self-pay

## 2020-08-27 ENCOUNTER — Non-Acute Institutional Stay (HOSPITAL_COMMUNITY)
Admission: RE | Admit: 2020-08-27 | Discharge: 2020-08-27 | Disposition: A | Discharge status: Home / Self Care | Payer: Medicaid Other | Source: Ambulatory Visit | Attending: Internal Medicine | Admitting: Internal Medicine

## 2020-08-27 DIAGNOSIS — G35 Multiple sclerosis: Secondary | ICD-10-CM | POA: Diagnosis present

## 2020-08-27 MED ORDER — SODIUM CHLORIDE 0.9 % IV SOLN
300.0000 mg | INTRAVENOUS | Status: DC
  Administered 2020-08-27: 300 mg via INTRAVENOUS
  Filled 2020-08-27: qty 15

## 2020-08-27 MED ORDER — SODIUM CHLORIDE 0.9 % IV SOLN
INTRAVENOUS | Status: DC | PRN
  Administered 2020-08-27: 250 mL via INTRAVENOUS

## 2020-08-27 NOTE — Progress Notes (Signed)
PATIENT CARE CENTER NOTE    Diagnosis: Multiple Sclerosis G35     Provider: Arlice Colt, MD     Procedure: Tysabri infusion      Note: Patient received Tysabri infusion via PIV. Tolerated well with no adverse reaction.  Patient declined 1 hour observation.  Vital signs stable. AVS offered but patient refused. Patient alert, oriented and ambulatory at discharge.

## 2020-09-02 ENCOUNTER — Telehealth: Payer: Self-pay | Admitting: Neurology

## 2020-09-02 MED ORDER — METHYLPREDNISOLONE 4 MG PO TBPK: ORAL_TABLET | ORAL | 0 refills | Status: DC

## 2020-09-02 NOTE — Telephone Encounter (Signed)
Called pt back. She did have IV placed in L arm for Tysabri infusion. Infusion went well, tolerating ok w/ no complications. Denies any problems w/ IV site post infusion. She has electrical sensation (like she got electrocuted) intermittently that radiates to her shoulder. Occurs at night as well. Episode can last 30 min and she goes numb on whole left side of body. Denies starting any new meds. When asked about possible UTI, she states she has no burning/pain but has noticed urine a little darker/has brownish tint to it/having bladder leaks.  Advised I will discuss w/ MD and call back.

## 2020-09-02 NOTE — Telephone Encounter (Signed)
Pt called stating that she has been experiencing an electrical current like feeling through her L arm ever since her Tysabri on Friday. Please advise.

## 2020-09-02 NOTE — Telephone Encounter (Signed)
Tried calling pt back. Went to VM, VM full.  I spoke w/ Dr. Felecia Shelling who would like to call in standard steroid pack for her to see if this helps w/ her sx. Methylprednisolone '4mg'$ , take 6 tabs on day 1, then decrease by 1 tab each day until finished #21, 0 refills

## 2020-09-02 NOTE — Telephone Encounter (Signed)
Took call from phone staff and spoke w/ pt. Relayed Dr. Garth Bigness recommendation. She is agreeable to plan. E-scribed to [facility] per pt request. She will call back next week if sx have not improved.

## 2020-09-03 ENCOUNTER — Other Ambulatory Visit: Payer: Self-pay | Admitting: Neurology

## 2020-09-06 ENCOUNTER — Telehealth: Payer: Self-pay | Admitting: Internal Medicine

## 2020-09-06 NOTE — Telephone Encounter (Signed)
Return pt's call who stated she's having difficulty with the left side of her body. Stated she has a electrical wave sensation going down the left side of her body. Sometimes her arm feels paralyze. It started about 1 week ago but now it's more intense. Stated it's affecting her heart and her eyes. She thinks she may have an infection again. She called her neurologist, Dr Garth Bigness office (see telephone encounter). She's on a steroid regimen; she has 2 more days left. Advised pt to go to the ED- stated she will go to the [facility] ED on [address]. She wanted to be sure Dr Heber Marathon be informed; informed he's not here but I will inform one of his partners.

## 2020-09-06 NOTE — Telephone Encounter (Signed)
We are aware.  Agree with going to the ED.

## 2020-09-06 NOTE — Telephone Encounter (Signed)
Pt states she was instructed to call her PCP per her instructions from her neurologist.  Patient state she was told she my possibly need to be admitted due to her symptoms.  Pt has questions and would like a call back.

## 2020-09-06 NOTE — Telephone Encounter (Signed)
Pt called in because she continues to have the electrical shock sensation that is on the left side of there body. She still has 2 days left on her steroid dose pack.  She states that the shock sensation is affecting her vision on the left eye and arm. She states she feels the shock in her heart. Informed the patient that we could work her in on Thursday at 9 am. She states that she is unable to make it that long. Advised that if she truly feels this is urgent/emergent and cant wait until Thursday, I would suggest getting evaluated through ER. Pt verbalized understanding. Pt was set up on scheduled with Dr Felecia Shelling in the meantime for Thursday and informed the patient to call back if she no longer needs that appointment. Pt verbalized understanding.

## 2020-09-07 ENCOUNTER — Emergency Department (HOSPITAL_BASED_OUTPATIENT_CLINIC_OR_DEPARTMENT_OTHER)
Admission: EM | Admit: 2020-09-07 | Discharge: 2020-09-07 | Disposition: A | Discharge status: Home / Self Care | Payer: Medicaid Other | Attending: Emergency Medicine | Admitting: Emergency Medicine

## 2020-09-07 ENCOUNTER — Other Ambulatory Visit: Payer: Self-pay

## 2020-09-07 ENCOUNTER — Emergency Department (HOSPITAL_BASED_OUTPATIENT_CLINIC_OR_DEPARTMENT_OTHER): Payer: Medicaid Other

## 2020-09-07 ENCOUNTER — Encounter (HOSPITAL_BASED_OUTPATIENT_CLINIC_OR_DEPARTMENT_OTHER): Payer: Self-pay | Admitting: Obstetrics and Gynecology

## 2020-09-07 DIAGNOSIS — I10 Essential (primary) hypertension: Secondary | ICD-10-CM | POA: Diagnosis not present

## 2020-09-07 DIAGNOSIS — G35 Multiple sclerosis: Secondary | ICD-10-CM

## 2020-09-07 DIAGNOSIS — N182 Chronic kidney disease, stage 2 (mild): Secondary | ICD-10-CM | POA: Insufficient documentation

## 2020-09-07 DIAGNOSIS — M47812 Spondylosis without myelopathy or radiculopathy, cervical region: Secondary | ICD-10-CM | POA: Diagnosis not present

## 2020-09-07 DIAGNOSIS — G939 Disorder of brain, unspecified: Secondary | ICD-10-CM | POA: Diagnosis not present

## 2020-09-07 LAB — URINALYSIS, ROUTINE W REFLEX MICROSCOPIC
Bilirubin Urine: NEGATIVE
Glucose, UA: NEGATIVE mg/dL
Hgb urine dipstick: NEGATIVE
Ketones, ur: NEGATIVE mg/dL
Nitrite: NEGATIVE
Specific Gravity, Urine: 1.025 (ref 1.005–1.030)
pH: 7 (ref 5.0–8.0)

## 2020-09-07 LAB — BASIC METABOLIC PANEL
Anion gap: 10 (ref 5–15)
BUN: 15 mg/dL (ref 6–20)
CO2: 25 mmol/L (ref 22–32)
Calcium: 8.8 mg/dL — ABNORMAL LOW (ref 8.9–10.3)
Chloride: 109 mmol/L (ref 98–111)
Creatinine, Ser: 1.01 mg/dL — ABNORMAL HIGH (ref 0.44–1.00)
GFR, Estimated: >60 mL/min (ref >60)
Glucose, Bld: 94 mg/dL (ref 70–99)
Potassium: 3.6 mmol/L (ref 3.5–5.1)
Sodium: 144 mmol/L (ref 135–145)

## 2020-09-07 LAB — CBC
HCT: 36.1 % (ref 36.0–46.0)
Hemoglobin: 11.4 g/dL — ABNORMAL LOW (ref 12.0–15.0)
MCH: 28.5 pg (ref 26.0–34.0)
MCHC: 31.6 g/dL (ref 30.0–36.0)
MCV: 90.3 fL (ref 80.0–100.0)
Platelets: 167 10*3/uL (ref 150–400)
RBC: 4 MIL/uL (ref 3.87–5.11)
RDW: 14.6 % (ref 11.5–15.5)
WBC: 7 10*3/uL (ref 4.0–10.5)
nRBC: 0.3 % — ABNORMAL HIGH (ref 0.0–0.2)

## 2020-09-07 LAB — SEDIMENTATION RATE: Sed Rate: 10 mm/hr (ref 0–22)

## 2020-09-07 LAB — C-REACTIVE PROTEIN: CRP: 0.6 mg/dL (ref <1.0)

## 2020-09-07 MED ORDER — IOHEXOL 300 MG/ML  SOLN
75.0000 mL | Freq: Once | INTRAMUSCULAR | Status: AC | PRN
  Administered 2020-09-07: 75 mL via INTRAVENOUS

## 2020-09-07 NOTE — ED Provider Notes (Signed)
Sed rate is within normal limits, urine without signs of infection and head and cervical spine without acute findings.  Patient to follow-up with her neurologist as planned on Thursday   Blanchie Dessert, MD 09/07/20 1723

## 2020-09-07 NOTE — ED Notes (Signed)
Pt advised urine sampled needed.  Pt requested cola to drink.  Pt will let staff know when she is able to provide urine sample.

## 2020-09-07 NOTE — ED Provider Notes (Signed)
[facility] EMERGENCY DEPT Provider Note   CSN: [id number] Arrival date & time: 09/07/20  1316     History Chief Complaint  Patient presents with   Multiple Sclerosis    Tamara [name] is a 60 y.o. female.  She reports that she has had intermittent shocking sensations on her left face and her left upper and lower extremities.  These have become more frequent and severe.  When they hit her, they have caused her to fall or dropped something secondary to severe pain but not weakness.  She has had similar symptoms in the past which have been attributed to her multiple sclerosis.  Per neurology notes, she has had normal EMGs, and they are unsure if her sensory symptoms are related to her multiple sclerosis.  She has been tried on multiple medications such as Lyrica for neuropathic pain.  She has told her neurologist that the symptoms are becoming more severe and frequent.  They prescribed a steroid taper, and she states that this has not changed her symptomatology.  She does have an appointment with neurology on September 09, 2020.  She told me that she had a history of osteomyelitis of the lumbar spine.  When I review her records, it looks like she had transverse myelitis.    The history is provided by the patient.  Neurologic Problem This is a new problem. The current episode started more than 1 week ago. The problem occurs daily. The problem has been rapidly worsening. Pertinent negatives include no chest pain, no abdominal pain, no headaches and no shortness of breath. Nothing aggravates the symptoms. Nothing relieves the symptoms. Treatments tried: steroid taper. The treatment provided no relief.      Past Medical History:  Diagnosis Date   Anemia    Bowel obstruction (HCC)    CKD (chronic kidney disease), stage II    based on labs   GERD (gastroesophageal reflux disease)    IBS (irritable bowel syndrome)    at age of 65   Multiple sclerosis ([place])    RBBB    Sciatica 2009    Vision abnormalities     Patient Active Problem List   Diagnosis Date Noted   Vitamin D deficiency 06/14/2020   B12 deficiency 03/22/2020   Left-sided chest wall pain 12/18/2019   Spasticity 08/18/2019   Sleep apnea 07/18/2019   Heart failure ([place]) 07/09/2019   Superficial phlebitis 06/27/2019   Bilateral hand numbness 01/14/2019   RLQ abdominal pain 10/22/2018   Pedal edema 06/04/2018   Gait disturbance 06/04/2018   Bilateral knee pain 01/07/2018   Left leg weakness 12/26/2017   Conjunctivitis 10/30/2017   Dental caries 07/30/2017   Dyspnea on exertion 07/30/2017   Morbid obesity ([place]) 12/18/2016   High risk medication use 07/04/2016   Multiple sclerosis ([place]) 06/19/2016   Neurogenic bowel    Neurogenic bladder    Transverse myelitis ([place]) 06/14/2016   Allergic rhinitis 04/12/2016   Sciatica associated with disorder of lumbosacral spine 01/15/2014   GERD (gastroesophageal reflux disease) 07/21/2013   RBBB on EKG 08/01/2012   IBS (irritable bowel syndrome) 06/19/2012   Healthcare maintenance 06/19/2012    Past Surgical History:  Procedure Laterality Date   ABDOMINAL HYSTERECTOMY  2008   cervix and right ovary still intact   KNEE ARTHROSCOPY  2010 and 2011   Left knee, x2     OB History     Gravida  2   Para  2   Term  2   Preterm  0   AB  0   Living  2      SAB  0   IAB  0   Ectopic  0   Multiple  0   Live Births              Family History  Problem Relation Age of Onset   Cancer Father        Gallbladder   Gallbladder disease Father    Hypertension Brother    Gallbladder disease Paternal Grandmother    Colon cancer Neg Hx    Colon polyps Neg Hx    Esophageal cancer Neg Hx    Kidney disease Neg Hx     Social History   Tobacco Use   Smoking status: Never   Smokeless tobacco: Never  Vaping Use   Vaping Use: Never used  Substance Use Topics   Alcohol use: No    Alcohol/week: 0.0 standard drinks   Drug use: No    Home  Medications Prior to Admission medications   Medication Sig Start Date End Date Taking? Authorizing Provider  acetaminophen (TYLENOL) 650 MG CR tablet Take 1,300 mg by mouth 3 (three) times daily as needed for pain.   Yes [provider]  Coenzyme Q10-Fish Oil-Vit E (CO-Q 10 OMEGA-3 FISH OIL PO) Take 1 capsule by mouth daily.   Yes [provider]  COLLAGEN PO Take 1 Dose by mouth every other day. Powder; alternate with pill form.   Yes [provider]  COLLAGEN PO Take 3 tablets by mouth every other day. Pill; alternate with powder form.   Yes [provider]  Disposable Gloves (NITRILE GLOVES MEDIUM) MISC Use prn for bowel incontinence 06/14/20  Yes Sater, Nanine Means, MD  docusate sodium (COLACE) 100 MG capsule Take 1 capsule (100 mg total) by mouth daily. Patient taking differently: Take 100 mg by mouth daily as needed for mild constipation. 06/28/16  Yes Angiulli, Lavon Paganini, PA-C  fluticasone (FLONASE) 50 MCG/ACT nasal spray Place 2 sprays into both nostrils daily. Patient taking differently: Place 2 sprays into both nostrils as needed for allergies. 10/14/19  Yes Maudie Mercury, MD  hyoscyamine (LEVSIN) 0.125 MG tablet Take 1 tablet (0.125 mg total) by mouth 3 (three) times daily. Patient taking differently: Take 0.125 mg by mouth as needed for bladder spasms. 06/14/20  Yes Sater, Nanine Means, MD  loratadine (CLARITIN) 10 MG tablet Take 10 mg by mouth daily as needed for allergies or rhinitis.   Yes [provider]  methylPREDNISolone (MEDROL DOSEPAK) 4 MG TBPK tablet Take 6 tablets on day 1 and decrease by 1 tablet each day until finished 09/02/20  Yes Sater, Nanine Means, MD  Multiple Vitamins-Minerals (HAIR/SKIN/NAILS) CAPS Take 1 capsule by mouth in the morning, at noon, and at bedtime.   Yes [provider]  Multiple Vitamins-Minerals (ONE-A-DAY WOMENS PO) Take 1 tablet by mouth daily.   Yes [provider]  natalizumab (TYSABRI) 300  MG/15ML injection Inject 300 mg into the vein every 28 (twenty-eight) days.   Yes [provider]  pantoprazole (PROTONIX) 40 MG tablet Take 1 tablet by mouth once daily Patient taking differently: Take 40 mg by mouth daily. 12/11/19  Yes Lucious Groves, DO  phentermine 37.5 MG capsule Take 1 capsule by mouth once daily in the morning Patient taking differently: Take 37.5 mg by mouth every morning. 04/26/20  Yes Sater, Nanine Means, MD  topiramate (TOPAMAX) 50 MG tablet Take 1 tablet (50 mg total) by  mouth 3 (three) times daily. 03/22/20  Yes Sater, Nanine Means, MD  traMADol (ULTRAM) 50 MG tablet TAKE 1 TABLET BY MOUTH TWICE DAILY AS NEEDED FOR SEVERE PAIN Patient taking differently: Take 50 mg by mouth as needed for severe pain. 02/17/19  Yes Sater, Nanine Means, MD  Vitamin D, Ergocalciferol, (DRISDOL) 1.25 MG (50000 UNIT) CAPS capsule TAKE 1 CAPSULE BY MOUTH EVERY 7 DAYS Patient taking differently: Take 50,000 Units by mouth every 7 (seven) days. 09/06/20  Yes Sater, Nanine Means, MD  ALPRAZolam Duanne Moron) 1 MG tablet Take 1-2 tablets thirty minutes prior to MRI.  May take one additional tablet before entering scanner, if needed.  MUST HAVE DRIVER. Patient not taking: No sig reported 07/14/19   Sater, Nanine Means, MD  benzonatate (TESSALON) 100 MG capsule Take 1 capsule (100 mg total) by mouth 3 (three) times daily as needed for cough. Patient not taking: No sig reported 05/11/20   Carmin Muskrat, MD    Allergies    Codeine, Dilaudid [hydromorphone hcl], Ditropan [oxybutynin], Other, Paxil [paroxetine hcl], Tegretol [carbamazepine], Trileptal [oxcarbazepine], Adhesive [tape], Augmentin [amoxicillin-pot clavulanate], and Gabapentin  Review of Systems   Review of Systems  Constitutional:  Negative for chills and fever.  HENT:  Negative for ear pain and sore throat.   Eyes:  Negative for pain and visual disturbance.  Respiratory:  Negative for cough and shortness of breath.   Cardiovascular:  Negative  for chest pain and palpitations.  Gastrointestinal:  Negative for abdominal pain and vomiting.  Genitourinary:  Negative for dysuria and hematuria.  Musculoskeletal:  Negative for arthralgias and back pain.  Skin:  Negative for color change and rash.  Neurological:  Negative for seizures, syncope and headaches.  All other systems reviewed and are negative.  Physical Exam Updated Vital Signs BP 122/75 (BP Location: Right Arm)   Pulse 68   Temp 98.6 F (37 C)   Resp 15   SpO2 99%   Physical Exam Vitals and nursing note reviewed.  Constitutional:      Appearance: She is well-developed.  HENT:     Head: Normocephalic and atraumatic.  Eyes:     Conjunctiva/sclera: Conjunctivae normal.  Cardiovascular:     Rate and Rhythm: Normal rate and regular rhythm.     Heart sounds: Normal heart sounds.  Pulmonary:     Effort: Pulmonary effort is normal. No tachypnea.     Breath sounds: Normal breath sounds.  Abdominal:     Palpations: Abdomen is soft.     Tenderness: There is no abdominal tenderness.  Musculoskeletal:        General: No deformity or signs of injury.     Cervical back: Normal range of motion. No tenderness.     Right lower leg: No edema.     Left lower leg: No edema.  Skin:    General: Skin is warm and dry.     Capillary Refill: Capillary refill takes less than 2 seconds.  Neurological:     General: No focal deficit present.     Mental Status: She is alert and oriented to person, place, and time.     Cranial Nerves: No cranial nerve deficit.     Sensory: No sensory deficit.     Motor: No weakness.     Coordination: Coordination normal.  Psychiatric:        Mood and Affect: Mood normal.        Behavior: Behavior normal.    ED Results / Procedures / Treatments  Labs (all labs ordered are listed, but only abnormal results are displayed) Labs Reviewed  BASIC METABOLIC PANEL - Abnormal; Notable for the following components:      Result Value   Creatinine, Ser  1.01 (*)    Calcium 8.8 (*)    All other components within normal limits  CBC - Abnormal; Notable for the following components:   Hemoglobin 11.4 (*)    nRBC 0.3 (*)    All other components within normal limits  URINALYSIS, ROUTINE W REFLEX MICROSCOPIC  SEDIMENTATION RATE  C-REACTIVE PROTEIN    EKG EKG Interpretation  Date/Time:  Tuesday September 07 2020 15:29:04 EDT Ventricular Rate:  57 PR Interval:  113 QRS Duration: 140 QT Interval:  437 QTC Calculation: 426 R Axis:   83 Text Interpretation: Sinus rhythm Borderline short PR interval Right bundle branch block No acute ischemia Confirmed by Lorre Munroe ([id number]) on 09/07/2020 3:40:46 PM  Radiology No results found.  Procedures Procedures   Medications Ordered in ED Medications - No data to display  ED Course  I have reviewed the triage vital signs and the nursing notes.  Pertinent labs & imaging results that were available during my care of the patient were reviewed by me and considered in my medical decision making (see chart for details).    MDM Rules/Calculators/A&P                           Daren Weihe is well-appearing.  She has multiple sclerosis and is under the care of neurology.  She receives injectable medications.  She presents with a worsening of a somewhat chronic neuropathic sensation of shocking over her entire left side.  Her neurologic exam is normal, and she does not display acute weakness in her extremities.  Because she presented to this freestanding emergency department, she is unable to have an MRI.  CT scan was ordered to evaluate for large, emergent pathology.  If negative, she can follow with neurology in 2 days as scheduled, and appropriate outpatient MRI studies can be ordered.  Initially, based on her reported history of osteomyelitis, I was concerned about infection.  However, as recorded above, this is actually transverse myelitis.  I had already added a CRP and sed rate to pursue possible  infectious etiologies.  However, I think this is much less likely than her MS. Final Clinical Impression(s) / ED Diagnoses Final diagnoses:  Multiple sclerosis [facility])    Rx / DC Orders ED Discharge Orders     None        Arnaldo Natal, MD 09/07/20 [phone]

## 2020-09-07 NOTE — Discharge Instructions (Addendum)
Labs and CAT scan of head and cervical spine are unchanged.  Continue to take your normal medications.

## 2020-09-07 NOTE — ED Triage Notes (Signed)
Patient reports to the ER for MS flare. Patient states it feels like electricity going through the left side of her body. Patient saw the neurologist and was placed on a tapered dose of prednisone. Patient states she is also having lower back pain.

## 2020-09-07 NOTE — ED Notes (Signed)
Registration staff was looking for the charge nurse and informed me that there are two persons from the [employer] that wants to talk to Ms. Aleea Nygard.  I went to the lobby and met two staffs from [employer] and I asked them as to how they know that Ms. Legate is here.  They informed me that they call the main campus and was informed that the patient is here.  I called Jenny Reichmann and asked her of her opinion.  Ms. Mariea Clonts asked me to ask the patient if she will allow the DSS to talk to her which the patient refused.  Patient was upset and spoke with the DSS.  This episode was witnessed by Linus Orn and Careers information officer.

## 2020-09-08 ENCOUNTER — Telehealth: Payer: Self-pay

## 2020-09-08 NOTE — Telephone Encounter (Signed)
Transition Care Management Follow-up Telephone Call Date of discharge and from where: 09/07/2020-[facility] How have you been since you were released from the hospital? Patient stated she is doing ok. Any questions or concerns? No  Items Reviewed: Did the pt receive and understand the discharge instructions provided? Yes  Medications obtained and verified?  No medications given at discharge. Other? No  Any new allergies since your discharge? No  Dietary orders reviewed? N/A Do you have support at home? Yes   Home Care and Equipment/Supplies: Were home health services ordered? not applicable If so, what is the name of the agency? N/A  Has the agency set up a time to come to the patient's home? not applicable Were any new equipment or medical supplies ordered?  No What is the name of the medical supply agency? N/A Were you able to get the supplies/equipment? not applicable Do you have any questions related to the use of the equipment or supplies? No  Functional Questionnaire: (I = Independent and D = Dependent) ADLs: I  Bathing/Dressing- I  Meal Prep- I  Eating- I  Maintaining continence- I  Transferring/Ambulation- I  Managing Meds- I  Follow up appointments reviewed:  PCP Hospital f/u appt confirmed? No   Specialist Hospital f/u appt confirmed? Yes  Scheduled to see Dr. Felecia Shelling on 09/09/2020 @ 9:00 am Are transportation arrangements needed? No  If their condition worsens, is the pt aware to call PCP or go to the Emergency Dept.? Yes Was the patient provided with contact information for the PCP's office or ED? Yes Was to pt encouraged to call back with questions or concerns? Yes

## 2020-09-09 ENCOUNTER — Encounter: Payer: Self-pay | Admitting: *Deleted

## 2020-09-09 ENCOUNTER — Telehealth: Payer: Self-pay | Admitting: Neurology

## 2020-09-09 ENCOUNTER — Encounter: Payer: Self-pay | Admitting: Neurology

## 2020-09-09 ENCOUNTER — Ambulatory Visit (INDEPENDENT_AMBULATORY_CARE_PROVIDER_SITE_OTHER): Payer: Medicaid Other | Admitting: Neurology

## 2020-09-09 ENCOUNTER — Other Ambulatory Visit: Payer: Self-pay | Admitting: Neurology

## 2020-09-09 VITALS — BP 125/80 | HR 65 | Ht 63.0 in | Wt 185.0 lb

## 2020-09-09 DIAGNOSIS — R269 Unspecified abnormalities of gait and mobility: Secondary | ICD-10-CM | POA: Diagnosis not present

## 2020-09-09 DIAGNOSIS — Z79899 Other long term (current) drug therapy: Secondary | ICD-10-CM | POA: Diagnosis not present

## 2020-09-09 DIAGNOSIS — D352 Benign neoplasm of pituitary gland: Secondary | ICD-10-CM

## 2020-09-09 DIAGNOSIS — N319 Neuromuscular dysfunction of bladder, unspecified: Secondary | ICD-10-CM | POA: Diagnosis not present

## 2020-09-09 DIAGNOSIS — G35 Multiple sclerosis: Secondary | ICD-10-CM

## 2020-09-09 DIAGNOSIS — R208 Other disturbances of skin sensation: Secondary | ICD-10-CM

## 2020-09-09 MED ORDER — LACOSAMIDE 100 MG PO TABS: ORAL_TABLET | ORAL | 5 refills | Status: DC

## 2020-09-09 NOTE — Telephone Encounter (Signed)
Letter written/signed by MD and given to pt.

## 2020-09-09 NOTE — Addendum Note (Signed)
Addended by: Darleen Crocker on: 09/09/2020 02:38 PM   Modules accepted: Orders

## 2020-09-09 NOTE — Telephone Encounter (Signed)
Dr. Felecia Shelling said he would like both MRI brain and cervical, thank you

## 2020-09-09 NOTE — Telephone Encounter (Signed)
PA submitted through [employer]/ [employer]  Key:[id number]. Can take 24-72 hrs

## 2020-09-09 NOTE — Telephone Encounter (Signed)
Thank you.  Brain [id number] (exp. 09/09/20 to 10/24/20) Cervical [id number] (exp. 09/09/20 to 10/24/20)  Order sent to GI. They will reach out to the patient to schedule.

## 2020-09-09 NOTE — Telephone Encounter (Signed)
Pt called, medication put on at today's office visit is not covered by the insurance.  Also need a doctor's note for not being able to attend meetings. Would like a call from the nurse to discuss.

## 2020-09-09 NOTE — Telephone Encounter (Signed)
Called the patient back to advise a letter was written today stating she had a visit. The patient states the letter has to reflect that this has been an ongoing issue since 08/28/20. Advised our first conversation was 09/02/2020 at which point we attempted treatment. Informed the patient that we advised her going to ER with the concerns she was having. They advised she be seen here.  Pt is needing this letter to reflect that she has been having medical concerns going on because she has been unable to complete meetings with DSS in regards to her grand daughter whom she has custody of.   Advised that I will have to initiate a PA for the vimpat since she has tried and failed so many medications.

## 2020-09-09 NOTE — Progress Notes (Signed)
[facility]  PATIENT: Tamara [name] DOB: 04-26-1960  REFERRING DOCTOR OR PCP:  Nadean Corwin SOURCE: Patient, notes from emergency room and recent hospital stay ([facility]), imaging and lab reports, MRI images on PACS.  _________________________________   HISTORICAL  CHIEF COMPLAINT:  Chief Complaint  Patient presents with   Follow-up    Rm 1, alone. Pt c/o of electric shock in L shoulder and arm and radiates down her whole left side to her leg, started about a week ago. Pt states certain metal nearby can intensify the shock. Pt states is causing more HA and vision problems and affecting her sleep.     HISTORY OF PRESENT ILLNESS:  Tamara [name] Is a 60 y.o. woman with relapsing remitting multiple sclerosis in May 2018 after she presented with transverse myelitis.   Update 09/09/2020: She is on Tysabri (last infusion 08/27/2020).   She is tolerating the infusions well  JCV Ab 03/22/2020 was 0.16 negative.    She is noting more electric like pain in the left arm > leg and numbness in the left arm --- the severe electric shock pain lasts 5 - 15 seconds and she has moderate pain the next 5 minutes that gradually resolves.    These occur 5 tines a day.   Picking up something with the left arm may trigger the episode.  It lso has woken her up a few times.   She started to have these about 10 days ago.   She just completed a steroid pack without benefit.   I believe they are sequelae of her cervical spine plaques from the MS.     She has allodynia in the left arm and hand.  She notes hand symptoms more when she makes a fist or squeezes.   She takes tramadol pain is worse.     She could not tolerate gabapentin, Lyrica, lamotrigine and oxcarbazepine and carbamazapine in the past   She had ankle swelling with nortriptyline.  NCV/EMG was fine in the past so sensory symptoms likely from spinal cord plaques.     She has reduced gait and balance.   She is noting more numbness in her groin and  has had some urinary and bowel incontinence.   The electric dysesthesias are disabling when they occur.   .   Sometimes lifting or reaching triggers incontinence.      She feels balance and gait seem worse off/on.  She sleeps worse the last week of each cycle and generally feels more tired with more neurologic symptoms at that time.       She has more stress going through a divorce now.     She is on topiramate for headaches with benefit.   She has fatigue.  She still takes phentermine    She has had a vit D deficiency and takes D3.   She has lost 9 pounds ad is trying to eat better.       MS History:   On 06/10/16, she had the onset of numbness and clumsiness in her legs.   As the day went on, her hands also became numb and clumsy.  She felt numb from her waist down.    She went to the [facility] and had an xtay of her back and was referred to orthopedics     Two days later, she went to the ED when her symptoms worsened and she was seen and discharged.    She saw her internist who ordered a  lumbar MRI.  The lumbar MRI showed degenerative changes that were mild at L4-L5 and the study was otherwise fairly normal.   She was referred to the ED and had an MRI of the brain, cervical spine and thoracic spine.    She was found to have a transverse myelitis in the cervical spine with an enhancing lesion in the posterior columns. There was also a smaller non-enhancing focus at C5   She also had 5-6 spots in her brain, the largest being periventricular on the right.  She was admitted to [facility] and had 3 days of IV Solumedrol followed by an oral taper.    She was transferred to rehabilitation and was discharged 5/30.   She walked out and felt much better.   Additionally, while she was in the hospital (06/15/2016) she had a lumbar puncture. The CSF was abnormal showing showing the presence  4 oligoclonal bands and an elevated IgG index of 0.9 (less than 0.7 is normal). The myelin basic protein was  mildly elevated at 2.2.      She had another exacerbation after returning home with left leg dragging and feeling more off balance with stumbling.   In retrospect, in 2017, she had an episode lasting a few weeks where she felt she was dragging her left leg some. This completely resolved and she did not have any other symptoms such as numbness at that time.   MRIs of the brain, cervical spine, thoracic spine and lumbar spine were personally reviewed. The MRI of the cervical spine shows an enhancing lesion posterior spinal cord adjacent to C2-C3. Additionally there is a nonenhancing focus adjacent to C5. In the brain, there are several T2/FLAIR hyperintense foci and the largest is in the right parietal lobe and there are 4 or 5 other small foci, one in the periventricular white matter of the right frontal lobe and the rest in the subcortical white matter.    Laboratory studies show 4 oligoclonal bands and an elevated IgG index of 0.9. Myelin basic protein was mildly elevated at 2.2. She had elevated glucose between 120 and 140 several times while getting steroids.   Her Vit D was mildly low (25.6) and she just started OTC supplementation.   She started Tysabri in July 2018  IMAGING  MRIs of the brain and cervical spine 07/16/2019 compared to the 2019 brain and 2018 cervical spine .  There are no new lesions. She has old cervical spine lesions at C3 and C5.  The brain shows multiple chronic foci in the hemispheres.  None in brainstem or cerebellum.  No focus enhances.       REVIEW OF SYSTEMS: Constitutional: No fevers, chills, sweats, or change in appetite Eyes: No visual changes, double vision, eye pain Ear, nose and throat: No hearing loss, ear pain, nasal congestion, sore throat Cardiovascular: No chest pain, palpitations Respiratory:  No shortness of breath at rest or with exertion.   No wheezes GastrointestinaI: No nausea, vomiting, diarrhea, abdominal pain, fecal incontinence Genitourinary:  No  dysuria, urinary retention or frequency.  No nocturia. Musculoskeletal:  No neck pain, back pain Integumentary: No rash, pruritus, skin lesions Neurological: as above Psychiatric: No depression at this time.  No anxiety Endocrine: No palpitations, diaphoresis, change in appetite, change in weigh or increased thirst Hematologic/Lymphatic:  No anemia, purpura, petechiae. Allergic/Immunologic: No itchy/runny eyes, nasal congestion, recent allergic reactions, rashes  ALLERGIES: Allergies  Allergen Reactions   Codeine Other (See Comments)    Delusions   Dilaudid [  Hydromorphone Hcl] Swelling and Other (See Comments)    Tongue swells    Ditropan [Oxybutynin] Other (See Comments)    Burning sensation   Other Swelling and Other (See Comments)    Unnamed gel or antiseptic solution- Was applied to IV site with a needle- Turned the skin "black and blue" that remained (caused burning and phlebitis, also)   Paxil [Paroxetine Hcl] Other (See Comments)    Hallucinations and heavy periods    Tegretol [Carbamazepine] Itching   Trileptal [Oxcarbazepine] Swelling   Adhesive [Tape] Hives, Itching, Rash and Other (See Comments)    PAPER TAPE   Augmentin [Amoxicillin-Pot Clavulanate] Rash   Gabapentin Rash and Other (See Comments)    Hallucinations and depression, also    HOME MEDICATIONS:  Current Outpatient Medications:    acetaminophen (TYLENOL) 650 MG CR tablet, Take 1,300 mg by mouth 3 (three) times daily as needed for pain., Disp: , Rfl:    ALPRAZolam (XANAX) 1 MG tablet, Take 1-2 tablets thirty minutes prior to MRI.  May take one additional tablet before entering scanner, if needed.  MUST HAVE DRIVER. (Patient taking differently: Take 1-2 tablets thirty minutes prior to MRI.  May take one additional tablet before entering scanner, if needed.  MUST HAVE DRIVER.), Disp: 3 tablet, Rfl: 0   benzonatate (TESSALON) 100 MG capsule, Take 1 capsule (100 mg total) by mouth 3 (three) times daily as needed  for cough., Disp: 21 capsule, Rfl: 0   Coenzyme Q10-Fish Oil-Vit E (CO-Q 10 OMEGA-3 FISH OIL PO), Take 1 capsule by mouth daily., Disp: , Rfl:    COLLAGEN PO, Take 1 Dose by mouth every other day. Powder; alternate with pill form., Disp: , Rfl:    COLLAGEN PO, Take 3 tablets by mouth every other day. Pill; alternate with powder form., Disp: , Rfl:    Disposable Gloves (NITRILE GLOVES MEDIUM) MISC, Use prn for bowel incontinence, Disp: 100 each, Rfl: 5   docusate sodium (COLACE) 100 MG capsule, Take 1 capsule (100 mg total) by mouth daily. (Patient taking differently: Take 100 mg by mouth daily as needed for mild constipation.), Disp: 10 capsule, Rfl: 0   fluticasone (FLONASE) 50 MCG/ACT nasal spray, Place 2 sprays into both nostrils daily. (Patient taking differently: Place 2 sprays into both nostrils as needed for allergies.), Disp: 16 g, Rfl: 2   hyoscyamine (LEVSIN) 0.125 MG tablet, Take 1 tablet (0.125 mg total) by mouth 3 (three) times daily. (Patient taking differently: Take 0.125 mg by mouth as needed for bladder spasms.), Disp: 90 tablet, Rfl: 5   Lacosamide (VIMPAT) 100 MG TABS, One po bid, Disp: 60 tablet, Rfl: 5   loratadine (CLARITIN) 10 MG tablet, Take 10 mg by mouth daily as needed for allergies or rhinitis., Disp: , Rfl:    methylPREDNISolone (MEDROL DOSEPAK) 4 MG TBPK tablet, Take 6 tablets on day 1 and decrease by 1 tablet each day until finished, Disp: 21 tablet, Rfl: 0   Multiple Vitamins-Minerals (HAIR/SKIN/NAILS) CAPS, Take 1 capsule by mouth in the morning, at noon, and at bedtime., Disp: , Rfl:    Multiple Vitamins-Minerals (ONE-A-DAY WOMENS PO), Take 1 tablet by mouth daily., Disp: , Rfl:    natalizumab (TYSABRI) 300 MG/15ML injection, Inject 300 mg into the vein every 28 (twenty-eight) days., Disp: , Rfl:    pantoprazole (PROTONIX) 40 MG tablet, Take 1 tablet by mouth once daily (Patient taking differently: Take 40 mg by mouth daily.), Disp: 90 tablet, Rfl: 1   phentermine  37.5 MG  capsule, Take 1 capsule by mouth once daily in the morning (Patient taking differently: Take 37.5 mg by mouth every morning.), Disp: 30 capsule, Rfl: 5   topiramate (TOPAMAX) 50 MG tablet, Take 1 tablet (50 mg total) by mouth 3 (three) times daily., Disp: 270 tablet, Rfl: 3   traMADol (ULTRAM) 50 MG tablet, TAKE 1 TABLET BY MOUTH TWICE DAILY AS NEEDED FOR SEVERE PAIN (Patient taking differently: Take 50 mg by mouth as needed for severe pain.), Disp: 60 tablet, Rfl: 0   Vitamin D, Ergocalciferol, (DRISDOL) 1.25 MG (50000 UNIT) CAPS capsule, TAKE 1 CAPSULE BY MOUTH EVERY 7 DAYS (Patient taking differently: Take 50,000 Units by mouth every 7 (seven) days.), Disp: 13 capsule, Rfl: 0  PAST MEDICAL HISTORY: Past Medical History:  Diagnosis Date   Anemia    Bowel obstruction (HCC)    CKD (chronic kidney disease), stage II    based on labs   GERD (gastroesophageal reflux disease)    IBS (irritable bowel syndrome)    at age of 69   Multiple sclerosis (HCC)    RBBB    Sciatica 2009   Vision abnormalities     PAST SURGICAL HISTORY: Past Surgical History:  Procedure Laterality Date   ABDOMINAL HYSTERECTOMY  2008   cervix and right ovary still intact   KNEE ARTHROSCOPY  2010 and 2011   Left knee, x2    FAMILY HISTORY: Family History  Problem Relation Age of Onset   Cancer Father        Gallbladder   Gallbladder disease Father    Hypertension Brother    Gallbladder disease Paternal Grandmother    Colon cancer Neg Hx    Colon polyps Neg Hx    Esophageal cancer Neg Hx    Kidney disease Neg Hx     SOCIAL HISTORY:  Social History   Socioeconomic History   Marital status: Married    Spouse name: Not on file   Number of children: 2   Years of education: Not on file   Highest education level: Not on file  Occupational History   Occupation: Science writer  Tobacco Use   Smoking status: Never   Smokeless tobacco: Never  Vaping Use   Vaping Use: Never used   Substance and Sexual Activity   Alcohol use: No    Alcohol/week: 0.0 standard drinks   Drug use: No   Sexual activity: Not on file  Other Topics Concern   Not on file  Social History Narrative   Not on file   Social Determinants of Health   Financial Resource Strain: Not on file  Food Insecurity: Not on file  Transportation Needs: Not on file  Physical Activity: Not on file  Stress: Not on file  Social Connections: Not on file  Intimate Partner Violence: Not on file     PHYSICAL EXAM  Vitals:   09/09/20 0919  BP: 125/80  Pulse: 65  Weight: 185 lb (83.9 kg)  Height: 5\' 3"  (1.6 m)    Body mass index is 32.77 kg/m.   General: The patient is well-developed and well-nourished and in no acute distress   Neurologic Exam  Mental status: The patient is alert and oriented x 3 at the time of the examination. The patient has apparent normal recent and remote memory, with an apparently normal attention span and concentration ability.   Speech is normal.  Cranial nerves: Extraocular movements are full. Facial strength and sensation is normal. Trapezius strength is normal.. Hearing appears to  be symmetric.  Motor:  Muscle bulk is normal.   Muscle tone is normal. Strength is 5/5 except 4+/5 hip flexion (iliopsoas).      Sensory:   She has allodynia in her left arm --- touch feels raw/scraping.  More symmetric in legs.    Coordination: Cerebellar testing reveals good finger-nose-finger and reduced left heel-to-shin bilaterally.  Gait and station: Station is normal.  Gait is wide.  Tandem gait is poor.  Left foot drop.  Romberg is negative.    Reflexes:  . Deep tendon reflexes are 3 and symmetric in the knees and ankles and 2 and symmetric in the arms.      DIAGNOSTIC DATA (LABS, IMAGING, TESTING) - I reviewed patient records, labs, notes, testing and imaging myself where available.  Lab Results  Component Value Date   WBC 7.0 09/07/2020   HGB 11.4 (L) 09/07/2020   HCT  36.1 09/07/2020   MCV 90.3 09/07/2020   PLT 167 09/07/2020      Component Value Date/Time   NA 144 09/07/2020 1405   NA 144 10/22/2018 1658   K 3.6 09/07/2020 1405   CL 109 09/07/2020 1405   CO2 25 09/07/2020 1405   GLUCOSE 94 09/07/2020 1405   BUN 15 09/07/2020 1405   BUN 13 10/22/2018 1658   CREATININE 1.01 (H) 09/07/2020 1405   CREATININE 1.01 07/08/2013 1440   CALCIUM 8.8 (L) 09/07/2020 1405   PROT 6.2 (L) 07/09/2019 1956   PROT 6.4 10/22/2018 1658   ALBUMIN 3.8 07/09/2019 1956   ALBUMIN 4.4 10/22/2018 1658   AST 16 07/09/2019 1956   ALT 15 07/09/2019 1956   ALKPHOS 59 07/09/2019 1956   BILITOT 0.5 07/09/2019 1956   BILITOT <0.2 10/22/2018 1658   GFRNONAA >60 09/07/2020 1405   GFRNONAA 64 07/08/2013 1440   GFRAA >60 07/10/2019 0519   GFRAA 74 07/08/2013 1440   No results found for: CHOL, HDL, LDLCALC, LDLDIRECT, TRIG, CHOLHDL No results found for: NUUV2Z Lab Results  Component Value Date   VITAMINB12 1,059 03/22/2020   Lab Results  Component Value Date   TSH 0.351 06/15/2016       ASSESSMENT AND PLAN  Multiple sclerosis (HCC) - Plan: Stratify JCV Antibody Test ([facility]), CBC with Differential/Platelet, MR CERVICAL SPINE W WO CONTRAST  High risk medication use - Plan: Stratify JCV Antibody Test ([facility]), CBC with Differential/Platelet  Dysesthesia - Plan: MR CERVICAL SPINE W WO CONTRAST   1.   Continue Tysabri.. Due to more bowel/bladder issues, we will check MRI cervical  spine and consider a change in therapy if new lesions or referral to surgery if extrinsic myelopathy.   If she converts from negative to high positive we will need to consider a different disease modifying therapy 2.   Add Vimpat for electic dysesthedias (had been unable to tolerate multiple other medications).  She will continue phentermine for fatigue/weight and Topamax for migraine 3.   Due to combination of physical and cognitive/fatigue issues, she is disabled and unable to work.  4.    Vit D supplementation.   Hyoscyamine for bowel/bladder issues 5.   RTC 6  months.   She is advised to call sooner if she has any new or worsening neurologic symptoms.  42 -minute office visit with the majority of the time spent face-to-face for history and physical, discussion/counseling and decision-making.  Additional time with record review and documentation.  Kristy Schomburg A. Epimenio Foot, MD, PhD, [place]  09/09/2020, 9:51 AM Certified in Neurology, Clinical Neurophysiology, Sleep  Medicine, Pain Medicine and Neuroimaging Dir., [facility]  [facility] [address] [place], [place] [address] [phone]

## 2020-09-09 NOTE — Telephone Encounter (Signed)
Do you want the patient to have the MRI Cervical and Brain because it looks like she has not had the Brain that was order on 06/10/20.

## 2020-09-09 NOTE — Telephone Encounter (Signed)
Placed JCV lab in [facility] lock box for routine lab pick up. Results pending. 

## 2020-09-10 LAB — CBC WITH DIFFERENTIAL/PLATELET
Basophils Absolute: 0 10*3/uL (ref 0.0–0.2)
Basos: 0 %
EOS (ABSOLUTE): 0 10*3/uL (ref 0.0–0.4)
Eos: 1 %
Hematocrit: 37.2 % (ref 34.0–46.6)
Hemoglobin: 11.9 g/dL (ref 11.1–15.9)
Immature Grans (Abs): 0 10*3/uL (ref 0.0–0.1)
Immature Granulocytes: 1 %
Lymphocytes Absolute: 3.3 10*3/uL — ABNORMAL HIGH (ref 0.7–3.1)
Lymphs: 42 %
MCH: 28.5 pg (ref 26.6–33.0)
MCHC: 32 g/dL (ref 31.5–35.7)
MCV: 89 fL (ref 79–97)
Monocytes Absolute: 0.6 10*3/uL (ref 0.1–0.9)
Monocytes: 7 %
Neutrophils Absolute: 3.8 10*3/uL (ref 1.4–7.0)
Neutrophils: 49 %
Platelets: 177 10*3/uL (ref 150–450)
RBC: 4.17 x10E6/uL (ref 3.77–5.28)
RDW: 14.7 % (ref 11.7–15.4)
WBC: 7.8 10*3/uL (ref 3.4–10.8)

## 2020-09-13 NOTE — Telephone Encounter (Signed)
PA denied.   Per your health plan's criteria, this drug is covered if you meet the following: One of the following: (1) You have a type of seizure disorder. (2) You have tried one preferred drug: gabapentin capsule/tablet/solution, lamotrigine immediaterelease tablet, levetiracetam tablet, Roweepra and zonisamide capsule.  Pt has already tried/failed gabapentin, lamotrigine,lyrica, oxcarbazepine, carbamazapine, and nortriptyline.  Will see if Dr Felecia Shelling would like to try levetiracetam, zonisimide or roweepra

## 2020-09-15 ENCOUNTER — Telehealth: Payer: Self-pay | Admitting: Neurology

## 2020-09-15 NOTE — Telephone Encounter (Signed)
Wills see what Dr Felecia Shelling recommends for next step   "Gwenette Greet from [facility] called stating the Lacosamide (VIMPAT) 100 MG TABS is still denied."

## 2020-09-15 NOTE — Telephone Encounter (Signed)
Tamara [name] from [facility] called stating the Lacosamide (VIMPAT) 100 MG TABS is still denied.

## 2020-09-15 NOTE — Telephone Encounter (Signed)
Documented on the prior phone note to keep all together

## 2020-09-15 NOTE — Telephone Encounter (Signed)
Maureen @ [employer] called to inform they received the appeal request and within 72 hours they will make a determination re: Lacosamide (VIMPAT) 100 MG TABS If there are questions Gwenette Greet can be reached at [phone]

## 2020-09-16 MED ORDER — ZONISAMIDE 100 MG PO CAPS: 100.0000 mg | ORAL_CAPSULE | Freq: Every day | ORAL | 5 refills | Status: DC

## 2020-09-16 NOTE — Telephone Encounter (Signed)
Since we were unable to get insurance approved for the lacosamide medication despite completing an appeal for the patient, Dr Felecia Shelling would like to order the patient zonisimide 100 mg to take at bedtime. Medication has been sent to the pharmacy for the patient.

## 2020-09-16 NOTE — Addendum Note (Signed)
Addended by: Darleen Crocker on: 09/16/2020 09:29 AM   Modules accepted: Orders

## 2020-09-20 ENCOUNTER — Ambulatory Visit: Payer: Medicaid Other | Admitting: Neurology

## 2020-09-20 NOTE — Telephone Encounter (Signed)
JCV result has come back.  Index value 0.17.  JCV antibody-negative. Will have Dr Felecia Shelling review also.

## 2020-09-23 ENCOUNTER — Telehealth: Payer: Self-pay | Admitting: Neurology

## 2020-09-23 ENCOUNTER — Other Ambulatory Visit: Payer: Self-pay | Admitting: *Deleted

## 2020-09-23 MED ORDER — ALPRAZOLAM 1 MG PO TABS: ORAL_TABLET | ORAL | 0 refills | Status: DC

## 2020-09-23 NOTE — Telephone Encounter (Signed)
Pt called, due to being claustrophobic need medication for MRI scheduled 8/25 at 1:30p. Sent to  [place]

## 2020-09-23 NOTE — Telephone Encounter (Signed)
Prescribed Xanax for MRI.

## 2020-09-24 ENCOUNTER — Ambulatory Visit
Admission: RE | Admit: 2020-09-24 | Discharge: 2020-09-24 | Disposition: A | Discharge status: Home / Self Care | Payer: Medicaid Other | Source: Ambulatory Visit | Attending: Neurology | Admitting: Neurology

## 2020-09-24 ENCOUNTER — Other Ambulatory Visit: Payer: Self-pay

## 2020-09-24 ENCOUNTER — Encounter (HOSPITAL_COMMUNITY): Payer: Medicaid Other

## 2020-09-24 DIAGNOSIS — G35 Multiple sclerosis: Secondary | ICD-10-CM

## 2020-09-24 DIAGNOSIS — R208 Other disturbances of skin sensation: Secondary | ICD-10-CM

## 2020-09-24 MED ORDER — GADOBENATE DIMEGLUMINE 529 MG/ML IV SOLN
17.0000 mL | Freq: Once | INTRAVENOUS | Status: AC | PRN
  Administered 2020-09-24: 17 mL via INTRAVENOUS

## 2020-09-25 ENCOUNTER — Emergency Department (HOSPITAL_BASED_OUTPATIENT_CLINIC_OR_DEPARTMENT_OTHER): Payer: Medicaid Other

## 2020-09-25 ENCOUNTER — Encounter (HOSPITAL_BASED_OUTPATIENT_CLINIC_OR_DEPARTMENT_OTHER): Payer: Self-pay

## 2020-09-25 ENCOUNTER — Emergency Department (HOSPITAL_BASED_OUTPATIENT_CLINIC_OR_DEPARTMENT_OTHER)
Admission: EM | Admit: 2020-09-25 | Discharge: 2020-09-25 | Disposition: A | Discharge status: Home / Self Care | Payer: Medicaid Other | Attending: Emergency Medicine | Admitting: Emergency Medicine

## 2020-09-25 DIAGNOSIS — N182 Chronic kidney disease, stage 2 (mild): Secondary | ICD-10-CM | POA: Diagnosis not present

## 2020-09-25 DIAGNOSIS — I509 Heart failure, unspecified: Secondary | ICD-10-CM | POA: Insufficient documentation

## 2020-09-25 DIAGNOSIS — R059 Cough, unspecified: Secondary | ICD-10-CM | POA: Diagnosis not present

## 2020-09-25 DIAGNOSIS — B9789 Other viral agents as the cause of diseases classified elsewhere: Secondary | ICD-10-CM | POA: Diagnosis not present

## 2020-09-25 DIAGNOSIS — Z79899 Other long term (current) drug therapy: Secondary | ICD-10-CM | POA: Insufficient documentation

## 2020-09-25 DIAGNOSIS — J069 Acute upper respiratory infection, unspecified: Secondary | ICD-10-CM | POA: Insufficient documentation

## 2020-09-25 MED ORDER — AZITHROMYCIN 250 MG PO TABS: 250.0000 mg | ORAL_TABLET | Freq: Every day | ORAL | 0 refills | Status: DC

## 2020-09-25 MED ORDER — BENZONATATE 100 MG PO CAPS: 100.0000 mg | ORAL_CAPSULE | Freq: Three times a day (TID) | ORAL | 0 refills | Status: DC

## 2020-09-25 NOTE — ED Triage Notes (Signed)
She c/o congested, productive cough x 3 days. She states her grandchild had recently had uri. She is concerned d/t her having m.s.

## 2020-09-25 NOTE — Discharge Instructions (Addendum)
Take tylenol 2 pills 4 times a day and motrin 4 pills 3 times a day.  Drink plenty of fluids.  Return for worsening shortness of breath, headache, confusion. Follow up with your family doctor.   

## 2020-09-25 NOTE — ED Provider Notes (Signed)
[facility] DEPT Provider Note   CSN: [id number] Arrival date & time: 09/25/20  1103     History Chief Complaint  Patient presents with   URI    Tamara [name] is a 60 y.o. female.  60 yo F with a cc of cough.  Going on for the past week. + sputum.  No n/v/d.  Her grandchild had a similar illness.  The history is provided by the patient.  URI Presenting symptoms: congestion and cough   Presenting symptoms: no fever and no rhinorrhea   Severity:  Moderate Onset quality:  Gradual Duration:  1 week Timing:  Constant Progression:  Worsening Chronicity:  New Relieved by:  Nothing Worsened by:  Nothing Ineffective treatments:  None tried Associated symptoms: no arthralgias, no headaches, no myalgias and no wheezing   Illness Severity:  Moderate Onset quality:  Gradual Duration:  1 week Timing:  Constant Progression:  Worsening Chronicity:  New Associated symptoms: congestion and cough   Associated symptoms: no chest pain, no fever, no headaches, no myalgias, no nausea, no rhinorrhea, no shortness of breath, no vomiting and no wheezing       Past Medical History:  Diagnosis Date   Anemia    Bowel obstruction (HCC)    CKD (chronic kidney disease), stage II    based on labs   GERD (gastroesophageal reflux disease)    IBS (irritable bowel syndrome)    at age of 89   Multiple sclerosis ([place])    RBBB    Sciatica 2009   Vision abnormalities     Patient Active Problem List   Diagnosis Date Noted   Vitamin D deficiency 06/14/2020   B12 deficiency 03/22/2020   Left-sided chest wall pain 12/18/2019   Spasticity 08/18/2019   Sleep apnea 07/18/2019   Heart failure ([place]) 07/09/2019   Superficial phlebitis 06/27/2019   Bilateral hand numbness 01/14/2019   RLQ abdominal pain 10/22/2018   Pedal edema 06/04/2018   Gait disturbance 06/04/2018   Bilateral knee pain 01/07/2018   Left leg weakness 12/26/2017   Conjunctivitis 10/30/2017   Dental  caries 07/30/2017   Dyspnea on exertion 07/30/2017   Morbid obesity ([place]) 12/18/2016   Dysesthesia 10/11/2016   High risk medication use 07/04/2016   Multiple sclerosis ([place]) 06/19/2016   Neurogenic bowel    Neurogenic bladder    Transverse myelitis ([place]) 06/14/2016   Allergic rhinitis 04/12/2016   Sciatica associated with disorder of lumbosacral spine 01/15/2014   GERD (gastroesophageal reflux disease) 07/21/2013   RBBB on EKG 08/01/2012   IBS (irritable bowel syndrome) 06/19/2012   Healthcare maintenance 06/19/2012    Past Surgical History:  Procedure Laterality Date   ABDOMINAL HYSTERECTOMY  2008   cervix and right ovary still intact   KNEE ARTHROSCOPY  2010 and 2011   Left knee, x2     OB History     Gravida  2   Para  2   Term  2   Preterm  0   AB  0   Living  2      SAB  0   IAB  0   Ectopic  0   Multiple  0   Live Births              Family History  Problem Relation Age of Onset   Cancer Father        Gallbladder   Gallbladder disease Father    Hypertension Brother    Gallbladder disease Paternal Grandmother  Colon cancer Neg Hx    Colon polyps Neg Hx    Esophageal cancer Neg Hx    Kidney disease Neg Hx     Social History   Tobacco Use   Smoking status: Never   Smokeless tobacco: Never  Vaping Use   Vaping Use: Never used  Substance Use Topics   Alcohol use: No    Alcohol/week: 0.0 standard drinks   Drug use: No    Home Medications Prior to Admission medications   Medication Sig Start Date End Date Taking? Authorizing Provider  azithromycin (ZITHROMAX) 250 MG tablet Take 1 tablet (250 mg total) by mouth daily. Take first 2 tablets together, then 1 every day until finished. 09/25/20  Yes Deno Etienne, DO  benzonatate (TESSALON) 100 MG capsule Take 1 capsule (100 mg total) by mouth every 8 (eight) hours. 09/25/20  Yes Deno Etienne, DO  acetaminophen (TYLENOL) 650 MG CR tablet Take 1,300 mg by mouth 3 (three) times daily as needed  for pain.    [provider]  ALPRAZolam Duanne Moron) 1 MG tablet Take 1-2 tablets thirty minutes prior to MRI.  May take one additional tablet before entering scanner, if needed.  MUST HAVE DRIVER. [id number]   Star Age, MD  Coenzyme Q10-Fish Oil-Vit E (CO-Q 10 OMEGA-3 FISH OIL PO) Take 1 capsule by mouth daily.    [provider]  COLLAGEN PO Take 1 Dose by mouth every other day. Powder; alternate with pill form.    [provider]  COLLAGEN PO Take 3 tablets by mouth every other day. Pill; alternate with powder form.    [provider]  Disposable Gloves (NITRILE GLOVES MEDIUM) MISC Use prn for bowel incontinence 06/14/20   Sater, Nanine Means, MD  docusate sodium (COLACE) 100 MG capsule Take 1 capsule (100 mg total) by mouth daily. Patient taking differently: Take 100 mg by mouth daily as needed for mild constipation. 06/28/16   Angiulli, Lavon Paganini, PA-C  fluticasone (FLONASE) 50 MCG/ACT nasal spray Place 2 sprays into both nostrils daily. Patient taking differently: Place 2 sprays into both nostrils as needed for allergies. 10/14/19   Maudie Mercury, MD  hyoscyamine (LEVSIN) 0.125 MG tablet Take 1 tablet (0.125 mg total) by mouth 3 (three) times daily. Patient taking differently: Take 0.125 mg by mouth as needed for bladder spasms. 06/14/20   Sater, Nanine Means, MD  Lacosamide (VIMPAT) 100 MG TABS One po bid 09/09/20   Sater, Nanine Means, MD  loratadine (CLARITIN) 10 MG tablet Take 10 mg by mouth daily as needed for allergies or rhinitis.    [provider]  methylPREDNISolone (MEDROL DOSEPAK) 4 MG TBPK tablet Take 6 tablets on day 1 and decrease by 1 tablet each day until finished 09/02/20   Sater, Nanine Means, MD  Multiple Vitamins-Minerals (HAIR/SKIN/NAILS) CAPS Take 1 capsule by mouth in the morning, at noon, and at bedtime.    [provider]  Multiple Vitamins-Minerals (ONE-A-DAY WOMENS PO) Take 1 tablet by mouth daily.    [provider]   natalizumab (TYSABRI) 300 MG/15ML injection Inject 300 mg into the vein every 28 (twenty-eight) days.    [provider]  pantoprazole (PROTONIX) 40 MG tablet Take 1 tablet by mouth once daily Patient taking differently: Take 40 mg by mouth daily. 12/11/19   Lucious Groves, DO  phentermine 37.5 MG capsule Take 1 capsule by mouth once daily in the morning Patient taking differently: Take 37.5 mg by mouth every morning. 04/26/20   Sater,  Richard A, MD  topiramate (TOPAMAX) 50 MG tablet Take 1 tablet (50 mg total) by mouth 3 (three) times daily. 03/22/20   Sater, Nanine Means, MD  traMADol (ULTRAM) 50 MG tablet TAKE 1 TABLET BY MOUTH TWICE DAILY AS NEEDED FOR SEVERE PAIN Patient taking differently: Take 50 mg by mouth as needed for severe pain. 02/17/19   Sater, Nanine Means, MD  Vitamin D, Ergocalciferol, (DRISDOL) 1.25 MG (50000 UNIT) CAPS capsule TAKE 1 CAPSULE BY MOUTH EVERY 7 DAYS Patient taking differently: Take 50,000 Units by mouth every 7 (seven) days. 09/06/20   Sater, Nanine Means, MD  zonisamide (ZONEGRAN) 100 MG capsule Take 1 capsule (100 mg total) by mouth at bedtime. 09/16/20   Sater, Nanine Means, MD    Allergies    Codeine, Dilaudid [hydromorphone hcl], Ditropan [oxybutynin], Other, Paxil [paroxetine hcl], Tegretol [carbamazepine], Trileptal [oxcarbazepine], Adhesive [tape], Augmentin [amoxicillin-pot clavulanate], and Gabapentin  Review of Systems   Review of Systems  Constitutional:  Negative for chills and fever.  HENT:  Positive for congestion. Negative for rhinorrhea.   Eyes:  Negative for redness and visual disturbance.  Respiratory:  Positive for cough. Negative for shortness of breath and wheezing.   Cardiovascular:  Negative for chest pain and palpitations.  Gastrointestinal:  Negative for nausea and vomiting.  Genitourinary:  Negative for dysuria and urgency.  Musculoskeletal:  Negative for arthralgias and myalgias.  Skin:  Negative for pallor and wound.  Neurological:   Negative for dizziness and headaches.   Physical Exam Updated Vital Signs BP 117/74 (BP Location: Right Arm)   Pulse 70   Temp 98.6 F (37 C) (Oral)   Resp 16   Ht 5' 3.5" (1.613 m)   Wt 83.9 kg   SpO2 100%   BMI 32.26 kg/m   Physical Exam Vitals and nursing note reviewed.  Constitutional:      General: She is not in acute distress.    Appearance: She is well-developed. She is not diaphoretic.  HENT:     Head: Normocephalic and atraumatic.  Eyes:     Pupils: Pupils are equal, round, and reactive to light.  Cardiovascular:     Rate and Rhythm: Normal rate and regular rhythm.     Heart sounds: No murmur heard.   No friction rub. No gallop.  Pulmonary:     Effort: Pulmonary effort is normal.     Breath sounds: No wheezing or rales.  Abdominal:     General: There is no distension.     Palpations: Abdomen is soft.     Tenderness: There is no abdominal tenderness.  Musculoskeletal:        General: No tenderness.     Cervical back: Normal range of motion and neck supple.  Skin:    General: Skin is warm and dry.  Neurological:     Mental Status: She is alert and oriented to person, place, and time.  Psychiatric:        Behavior: Behavior normal.    ED Results / Procedures / Treatments   Labs (all labs ordered are listed, but only abnormal results are displayed) Labs Reviewed - No data to display  EKG None  Radiology DG Chest [facility] 1 View  Result Date: 09/25/2020 CLINICAL DATA:  Cough. EXAM: PORTABLE CHEST 1 VIEW COMPARISON:  May 11, 2020 FINDINGS: The heart size and mediastinal contours are within normal limits. Both lungs are clear. The visualized skeletal structures are unremarkable. IMPRESSION: No active disease. Electronically Signed   By: Shanon Brow  Jimmye Norman III M.D.   On: 09/25/2020 11:59    Procedures Procedures   Medications Ordered in ED Medications - No data to display  ED Course  I have reviewed the triage vital signs and the nursing  notes.  Pertinent labs & imaging results that were available during my care of the patient were reviewed by me and considered in my medical decision making (see chart for details).    MDM Rules/Calculators/A&P                           60 yo F with a chief complaints of cough.  Going on for the past week.  Has a history of MS and is intermittently on immunosuppressants.  She is well-appearing and nontoxic.  She is describing some paroxysms of coughing.  This concerns me somewhat for pertussis.  Will start on a Z-Pak.  Obtain a chest x-ray.  Reassess.  Chest reviewed by me without focal infiltrate.    12:04 PM:  I have discussed the diagnosis/risks/treatment options with the patient and believe the pt to be eligible for discharge home to follow-up with PCP. We also discussed returning to the ED immediately if new or worsening sx occur. We discussed the sx which are most concerning (e.g., sudden worsening pain, fever, inability to tolerate by mouth) that necessitate immediate return. Medications administered to the patient during their visit and any new prescriptions provided to the patient are listed below.  Medications given during this visit Medications - No data to display   The patient appears reasonably screen and/or stabilized for discharge and I doubt any other medical condition or other [facility] requiring further screening, evaluation, or treatment in the ED at this time prior to discharge.  Final Clinical Impression(s) / ED Diagnoses Final diagnoses:  Viral upper respiratory tract infection    Rx / DC Orders ED Discharge Orders          Ordered    azithromycin (ZITHROMAX) 250 MG tablet  Daily        09/25/20 1203    benzonatate (TESSALON) 100 MG capsule  Every 8 hours        09/25/20 [place], [place], DO 09/25/20 1204

## 2020-09-27 ENCOUNTER — Telehealth: Payer: Self-pay | Admitting: *Deleted

## 2020-09-27 NOTE — Telephone Encounter (Signed)
Transition Care Management Follow-up Telephone Call Date of discharge and from where: 09/25/2020 - [facility] How have you been since you were released from the hospital? "About the same" Any questions or concerns? No  Items Reviewed: Did the pt receive and understand the discharge instructions provided? Yes  Medications obtained and verified? Yes  Other? No  Any new allergies since your discharge? No  Dietary orders reviewed? No Do you have support at home? Yes    Functional Questionnaire: (I = Independent and D = Dependent) ADLs: I  Bathing/Dressing- I  Meal Prep- I  Eating- I  Maintaining continence- I  Transferring/Ambulation- I  Managing Meds- I  Follow up appointments reviewed:  PCP Hospital f/u appt confirmed? No  - advised pt that if she does not improve to call her PCP office for an appointment [facility] f/u appt confirmed? No   Are transportation arrangements needed? No  If their condition worsens, is the pt aware to call PCP or go to the Emergency Dept.? Yes Was the patient provided with contact information for the PCP's office or ED? Yes Was to pt encouraged to call back with questions or concerns? Yes

## 2020-09-29 ENCOUNTER — Non-Acute Institutional Stay (HOSPITAL_COMMUNITY)
Admission: RE | Admit: 2020-09-29 | Discharge: 2020-09-29 | Disposition: A | Discharge status: Home / Self Care | Payer: Medicaid Other | Source: Ambulatory Visit | Attending: Internal Medicine | Admitting: Internal Medicine

## 2020-09-29 ENCOUNTER — Other Ambulatory Visit: Payer: Self-pay

## 2020-09-29 DIAGNOSIS — G35 Multiple sclerosis: Secondary | ICD-10-CM | POA: Diagnosis not present

## 2020-09-29 MED ORDER — SODIUM CHLORIDE 0.9 % IV SOLN
INTRAVENOUS | Status: DC | PRN
  Administered 2020-09-29: 250 mL via INTRAVENOUS

## 2020-09-29 MED ORDER — SODIUM CHLORIDE 0.9 % IV SOLN
300.0000 mg | INTRAVENOUS | Status: DC
  Administered 2020-09-29: 300 mg via INTRAVENOUS
  Filled 2020-09-29: qty 15

## 2020-09-29 NOTE — Progress Notes (Addendum)
CENTER NOTE   Diagnosis: Multiple Sclerosis       Provider: Arlice Colt, MD     Procedure: Fritzi Mandes '300mg'$      Note: Patient received Tysabri infusion via PIV. No premeds required per orders. Tolerated infusion well with no adverse reaction. Vital signs stable. Pt declined AVS.  Patient declined to stay for the 1 hour post-infusion observation. Pt instructed to schedule next infusion at front desk prior to leaving, verbalized understanding. Alert, oriented and ambulatory at discharge.

## 2020-10-05 ENCOUNTER — Other Ambulatory Visit: Payer: Self-pay | Admitting: Neurology

## 2020-10-05 DIAGNOSIS — G35 Multiple sclerosis: Secondary | ICD-10-CM

## 2020-10-15 ENCOUNTER — Other Ambulatory Visit: Payer: Self-pay | Admitting: Neurology

## 2020-10-17 ENCOUNTER — Other Ambulatory Visit: Payer: Self-pay | Admitting: Neurology

## 2020-10-18 ENCOUNTER — Other Ambulatory Visit: Payer: Self-pay

## 2020-10-19 MED ORDER — PANTOPRAZOLE SODIUM 40 MG PO TBEC: 40.0000 mg | DELAYED_RELEASE_TABLET | Freq: Every day | ORAL | 1 refills | Status: DC

## 2020-10-19 NOTE — Telephone Encounter (Signed)
Will send in this rx but without refill. I would like to see Betsabe in within the next 3 months.

## 2020-11-02 ENCOUNTER — Encounter (HOSPITAL_COMMUNITY): Payer: Medicaid Other

## 2020-11-04 DIAGNOSIS — Z0271 Encounter for disability determination: Secondary | ICD-10-CM

## 2020-11-09 ENCOUNTER — Encounter (HOSPITAL_COMMUNITY): Payer: Medicaid Other

## 2020-11-09 ENCOUNTER — Non-Acute Institutional Stay (HOSPITAL_COMMUNITY)
Admission: RE | Admit: 2020-11-09 | Discharge: 2020-11-09 | Disposition: A | Discharge status: Home / Self Care | Payer: Medicaid Other | Source: Ambulatory Visit | Attending: Internal Medicine | Admitting: Internal Medicine

## 2020-11-09 ENCOUNTER — Other Ambulatory Visit: Payer: Self-pay

## 2020-11-09 DIAGNOSIS — G35 Multiple sclerosis: Secondary | ICD-10-CM | POA: Diagnosis not present

## 2020-11-09 MED ORDER — SODIUM CHLORIDE 0.9 % IV SOLN
300.0000 mg | INTRAVENOUS | Status: DC
  Administered 2020-11-09: 300 mg via INTRAVENOUS
  Filled 2020-11-09: qty 15

## 2020-11-09 MED ORDER — SODIUM CHLORIDE 0.9 % IV SOLN
INTRAVENOUS | Status: DC | PRN
  Administered 2020-11-09: 250 mL via INTRAVENOUS

## 2020-11-09 NOTE — Progress Notes (Signed)
PATIENT CARE CENTER NOTE     Diagnosis: Multiple Sclerosis G35     Provider: Arlice Colt, MD     Procedure: Tysabri infusion      Note: Patient received Tysabri infusion (dose 1 of 12) via PIV. Tolerated well with no adverse reaction.  Patient declined to wait the 1 hour observation post-infusion.  Vital signs stable. AVS offered but patient refused. Patient alert, oriented and ambulatory at discharge.

## 2020-11-19 ENCOUNTER — Other Ambulatory Visit: Payer: Self-pay | Admitting: Neurology

## 2020-11-22 ENCOUNTER — Other Ambulatory Visit: Payer: Self-pay

## 2020-11-22 MED ORDER — VITAMIN D (ERGOCALCIFEROL) 1.25 MG (50000 UNIT) PO CAPS: 50000.0000 [IU] | ORAL_CAPSULE | ORAL | 1 refills | Status: DC

## 2020-11-23 ENCOUNTER — Other Ambulatory Visit: Payer: Self-pay | Admitting: *Deleted

## 2020-12-07 ENCOUNTER — Encounter (HOSPITAL_COMMUNITY): Payer: Medicaid Other

## 2020-12-09 ENCOUNTER — Telehealth: Payer: Self-pay | Admitting: Neurology

## 2020-12-09 NOTE — Telephone Encounter (Signed)
Pt called, received your Covid and flu vaccine this past Monday. Want to know if ok get my infusion? Would like a call from the nurse.

## 2020-12-09 NOTE — Telephone Encounter (Signed)
Called the back and advised her that she can wait a week but that it is not something as concerning as it would be if she was receiving the ocrevus infusion.Pt verbalized understanding and was appreciative for the call back.

## 2020-12-13 ENCOUNTER — Non-Acute Institutional Stay (HOSPITAL_COMMUNITY)
Admission: RE | Admit: 2020-12-13 | Discharge: 2020-12-13 | Disposition: A | Discharge status: Home / Self Care | Payer: Medicaid Other | Source: Ambulatory Visit | Attending: Internal Medicine | Admitting: Internal Medicine

## 2020-12-13 ENCOUNTER — Other Ambulatory Visit: Payer: Self-pay

## 2020-12-13 DIAGNOSIS — G35 Multiple sclerosis: Secondary | ICD-10-CM | POA: Diagnosis not present

## 2020-12-13 MED ORDER — SODIUM CHLORIDE 0.9 % IV SOLN: INTRAVENOUS | Status: DC | PRN

## 2020-12-13 MED ORDER — SODIUM CHLORIDE 0.9 % IV SOLN
300.0000 mg | INTRAVENOUS | Status: DC
  Administered 2020-12-13: 300 mg via INTRAVENOUS
  Filled 2020-12-13: qty 15

## 2020-12-13 NOTE — Progress Notes (Signed)
PATIENT CARE CENTER NOTE   Diagnosis: Multiple Sclerosis       Provider: Arlice Colt, MD     Procedure: Fritzi Mandes 300mg      Note: Patient received Tysabri infusion (dose #2 of 12) via PIV. Tolerated infusion well with no adverse reaction. Vital signs stable. Patient declined to stay for the 1 hour post-infusion observation. Pt declined AVS. Instructed pt to schedule next infusion prior to leaving, verbalized understanding. Alert, oriented and ambulatory at discharge.

## 2020-12-16 ENCOUNTER — Ambulatory Visit: Payer: Medicaid Other | Admitting: Neurology

## 2021-01-01 ENCOUNTER — Other Ambulatory Visit: Payer: Self-pay | Admitting: Neurology

## 2021-01-04 NOTE — Telephone Encounter (Signed)
Pt called and scheduled an appt on 06/13/2021.

## 2021-01-04 NOTE — Telephone Encounter (Signed)
Received refill request for phentermine.  Last OV was on 09/09/20.  Next OV has not been scheduled. Will be notifying pt to sch appt.  Last RX was written on 11/25/20 for 30 tabs.   [place] [facility] Database has been reviewed.

## 2021-01-10 ENCOUNTER — Other Ambulatory Visit: Payer: Self-pay

## 2021-01-10 ENCOUNTER — Non-Acute Institutional Stay (HOSPITAL_COMMUNITY)
Admission: RE | Admit: 2021-01-10 | Discharge: 2021-01-10 | Disposition: A | Discharge status: Home / Self Care | Payer: Medicaid Other | Source: Ambulatory Visit | Attending: Internal Medicine | Admitting: Internal Medicine

## 2021-01-10 DIAGNOSIS — G35 Multiple sclerosis: Secondary | ICD-10-CM | POA: Diagnosis present

## 2021-01-10 MED ORDER — SODIUM CHLORIDE 0.9 % IV SOLN
300.0000 mg | INTRAVENOUS | Status: DC
  Administered 2021-01-10: 300 mg via INTRAVENOUS
  Filled 2021-01-10: qty 15

## 2021-01-10 MED ORDER — SODIUM CHLORIDE 0.9 % IV SOLN: INTRAVENOUS | Status: DC | PRN

## 2021-01-10 NOTE — Progress Notes (Signed)
CENTER NOTE   Diagnosis: Multiple Sclerosis       Provider: Arlice Colt, MD     Procedure: Fritzi Mandes 300mg      Note: Patient received Tysabri infusion ( dose # 3 of 12) via PIV. No premeds required per orders.  Tolerated infusion well with no adverse reaction.  Pt states she has been experiencing numbness/spasms in hands and legs since last night, states she feels off balance. Pt states she thinks it related to the weather and needing her tysabri infusion. Dr. Garth Bigness RN [place] Gaudy notified, instructed to administer tysabri today and pt should notify neurologist office if symptoms worsen or persist past 24 hours. Pt made aware, verbalized understanding. Pt declined AVS.Patient declined to stay for the 1 hour post-infusion observation.  VSS, alert, oriented and ambulatory at discharge.

## 2021-01-14 ENCOUNTER — Ambulatory Visit: Payer: Self-pay

## 2021-01-14 ENCOUNTER — Other Ambulatory Visit: Payer: Self-pay | Admitting: *Deleted

## 2021-01-14 ENCOUNTER — Telehealth: Payer: Self-pay | Admitting: *Deleted

## 2021-01-14 NOTE — Patient Outreach (Signed)
Care Coordination  01/14/2021  Tamara [name] July 11, 1960 [id number]  Second attempt to reach patient via phone today successful however as this RNCM explained details of disease management program to patient she stated she is concerned about a family member that works for [employer],  will access her records. She says she has reported her in the past but that [place] was unable to prove she had accessed her chart without permission. Patient then spoke for over and hour about the stress she is dealing with due to family issues. This RNCM provided emotional support.   Plan: After discussing with patient, the initial assessment was reschuduled for 01/21/21 at 9:30 am.   [name] RN, CCM, [place] [facility] Management Coordinator - Managed [place] High Risk [phone]

## 2021-01-14 NOTE — Patient Outreach (Signed)
Care Coordination  01/14/2021  Tamara [name] [birth year] [id number]  01/14/2021 Name: Tamara [name] MRN: [id number] DOB: 1960-10-13  Referred by: Lucious Groves, DO Reason for referral : High Risk Managed Medicaid (Unsuccessful Initial RNCM outreach)   An unsuccessful telephone outreach was attempted today. The patient was referred to the case management team for assistance with care management and care coordination.   Multiple calls to number listed as patient's contact; recording states call cannot be completed at this time.   Follow Up Plan: The Managed Medicaid care management team will reach out to the patient again over the next 7 days.    [name] RN, CCM, [facility] Care Management Coordinator - Managed [place] High Risk [phone]

## 2021-01-21 ENCOUNTER — Other Ambulatory Visit: Payer: Self-pay | Admitting: *Deleted

## 2021-01-21 ENCOUNTER — Ambulatory Visit: Payer: Medicaid Other

## 2021-01-21 DIAGNOSIS — G35 Multiple sclerosis: Secondary | ICD-10-CM

## 2021-01-21 NOTE — Patient Outreach (Addendum)
Medicaid Managed Care   Nurse Care Manager Note  01/25/2021 Name:  Tamara [name] MRN:  [id number] DOB:  23-Aug-1960  Tamara [name] is an 61 y.o. year old female who is a primary patient of Lucious Groves, DO.  The [facility] Managed Care Coordination team was consulted for assistance with:    Multiple Sclerosis  Tamara [name] was given information about Medicaid Managed Care Coordination team services today. Tamara [name] Patient agreed to services and verbal consent obtained.  Engaged with patient by telephone for initial visit in response to provider referral for case management and/or care coordination services.   Assessments/Interventions:  Review of past medical history, allergies, medications, health status, including review of consultants reports, laboratory and other test data, was performed as part of comprehensive evaluation and provision of chronic care management services.  SDOH (Social Determinants of Health) assessments and interventions performed: SDOH Interventions    Flowsheet Row Most Recent Value  SDOH Interventions   Housing Interventions Intervention Not Indicated  Physical Activity Interventions Other (Comments)  [she needs a home exercise machine to keep her mobile - she will check with benefits at her managed Medicaid health plan]  Transportation Interventions Intervention Not Indicated       Care Plan  Allergies  Allergen Reactions   Codeine Other (See Comments)    Delusions   Dilaudid [Hydromorphone Hcl] Swelling and Other (See Comments)    Tongue swells    Ditropan [Oxybutynin] Other (See Comments)    Burning sensation   Other Swelling and Other (See Comments)    Unnamed gel or antiseptic solution- Was applied to IV site with a needle- Turned the skin "black and blue" that remained (caused burning and phlebitis, also)   Paxil [Paroxetine Hcl] Other (See Comments)    Hallucinations and heavy periods    Tegretol [Carbamazepine] Itching   Trileptal  [Oxcarbazepine] Swelling   Adhesive [Tape] Hives, Itching, Rash and Other (See Comments)    PAPER TAPE   Augmentin [Amoxicillin-Pot Clavulanate] Rash   Gabapentin Rash and Other (See Comments)    Hallucinations and depression, also    Medications Reviewed Today     Reviewed by Barrington Ellison, RN (Registered Nurse) on 01/21/21 at [place] List Status: <None>   Medication Order Taking? Sig Documenting Provider Last Dose Status Informant  acetaminophen (TYLENOL) 650 MG CR tablet [id number] Yes Take 1,300 mg by mouth 3 (three) times daily as needed for pain. [provider] Taking Active Self           Med Note Duffy Bruce, Para [place] Jul 10, 2019  4:59 PM) Patient stated she does not "mix" Tylenol with Tramadol  ALPRAZolam (XANAX) 1 MG tablet [id number] No Take 1-2 tablets thirty minutes prior to MRI.  May take one additional tablet before entering scanner, if needed.  MUST HAVE DRIVER.  Patient not taking: Reported on 01/21/2021   Star Age, MD Not Taking Consider Medication Status and Discontinue (Patient Preference)   azithromycin (ZITHROMAX) 250 MG tablet [id number] No Take 1 tablet (250 mg total) by mouth daily. Take first 2 tablets together, then 1 every day until finished.  Patient not taking: Reported on 01/21/2021   Deno Etienne, DO Not Taking Consider Medication Status and Discontinue (Patient Preference)   baclofen (LIORESAL) 10 MG tablet [id number] Yes Take 10 mg by mouth 3 (three) times daily as needed for muscle spasms. [provider] Taking Active Self           Med Note (  Barrington Ellison   Fri Jan 21, 2021 10:02 AM) Dewaine Conger rarely   benzonatate (TESSALON) 100 MG capsule [id number] No Take 1 capsule (100 mg total) by mouth every 8 (eight) hours.  Patient not taking: Reported on 01/21/2021   Deno Etienne, DO Not Taking Consider Medication Status and Discontinue   Coenzyme Q10-Fish Oil-Vit E (CO-Q 10 OMEGA-3 FISH OIL PO) [id number] Yes Take 1 capsule by mouth daily.  [provider] Taking Active Self  COLLAGEN PO [id number] Yes Take 1 Dose by mouth every other day. Powder; alternate with pill form. [provider] Taking Active Self  COLLAGEN PO [id number] Yes Take 3 tablets by mouth every other day. Pill; alternate with powder form. [provider] Taking Active Self  Disposable Gloves (NITRILE GLOVES MEDIUM) [place] [id number] Yes Use prn for bowel incontinence Sater, Nanine Means, MD Taking Active Self           Med Note Broadus John, Trude Mcburney   Fri Jan 21, 2021  9:47 AM) 5 M brand gloves  docusate sodium (COLACE) 100 MG capsule [id number] No Take 1 capsule (100 mg total) by mouth daily.  Patient not taking: Reported on 01/21/2021   Cathlyn Parsons, PA-C Not Taking Consider Medication Status and Discontinue (Patient Preference)   fluticasone (FLONASE) 50 MCG/ACT nasal spray [id number] Yes Place 2 sprays into both nostrils daily.  Patient taking differently: Place 2 sprays into both nostrils as needed for allergies.   Maudie Mercury, MD Taking Active   hyoscyamine (LEVSIN) 0.125 MG tablet [id number] Yes Take 1 tablet (0.125 mg total) by mouth 3 (three) times daily.  Patient taking differently: Take 0.125 mg by mouth as needed for bladder spasms.   Sater, Nanine Means, MD Taking Active            Med Note Broadus John, Trude Mcburney   Fri Jan 21, 2021  9:43 AM) Patient hasn't taken recent   Lacosamide (VIMPAT) 100 MG TABS [id number] No One po bid  Patient not taking: Reported on 01/21/2021   Britt Bottom, MD Not Taking Consider Medication Status and Discontinue (Patient Preference)   loratadine (CLARITIN) 10 MG tablet [id number] Yes Take 10 mg by mouth daily as needed for allergies or rhinitis. [provider] Taking Active Self  methylPREDNISolone (MEDROL DOSEPAK) 4 MG TBPK tablet [id number] No Take 6 tablets on day 1 and decrease by 1 tablet each day until finished  Patient not taking: Reported on 01/21/2021   Britt Bottom, MD Not Taking  Consider Medication Status and Discontinue (Patient Preference) Self           Med Note [facility] Billing A   Tue Sep 07, 2020  2:37 PM) Pt stated she will finish course tomorrow.   Multiple Vitamins-Minerals (HAIR/SKIN/NAILS) CAPS [id number] Yes Take 1 capsule by mouth in the morning, at noon, and at bedtime. [provider] Taking Active Self  Multiple Vitamins-Minerals (ONE-A-DAY WOMENS PO) [id number] Yes Take 1 tablet by mouth daily. [provider] Taking Active Self  natalizumab (TYSABRI) 300 MG/15ML injection [id number] Yes Inject 300 mg into the vein every 28 (twenty-eight) days. [provider] Taking Active Self           Med Note Ronnald Ramp, EMMA L   Tue Aug 05, 2019 10:33 AM)  Received fax notification that pt is re-auth for Tysabri via touch program 08/05/19-02/25/20. Account: [place]. Site auth number: [id number]. Pt enrollment number: [id number].  pantoprazole (PROTONIX) 40 MG tablet [id number] Yes Take  1 tablet (40 mg total) by mouth daily. Lucious Groves, DO Taking Active   phentermine 37.5 MG capsule [id number] Yes Take 1 capsule by mouth once daily in the morning Britt Bottom, MD Taking Active   topiramate (TOPAMAX) 50 MG tablet [id number] Yes TAKE 1 TABLET BY MOUTH THREE TIMES DAILY Sater, Nanine Means, MD Taking Active            Med Note Broadus John, Trude Mcburney   Fri Jan 21, 2021  9:56 AM) Dewaine Conger for energy and to prevent seizures    traMADol (ULTRAM) 50 MG tablet [id number] Yes TAKE 1 TABLET BY MOUTH TWICE DAILY AS NEEDED FOR SEVERE PAIN  Patient taking differently: Take 50 mg by mouth as needed for severe pain.   Sater, Nanine Means, MD Taking Active            Med Note Broadus John, Trude Mcburney   Fri Jan 21, 2021  9:54 AM) Dewaine Conger rarely and only for severe pain and does not take with Tylenol  Vitamin D, Ergocalciferol, (DRISDOL) 1.25 MG (50000 UNIT) CAPS capsule [id number]  Take 1 capsule (50,000 Units total) by mouth every 7 (seven) days. Sater,  Nanine Means, MD  Active            Med Note Broadus John, Trude Mcburney   Fri Jan 21, 2021  9:55 AM) Dewaine Conger for energy and to prevent seizures  zonisamide (ZONEGRAN) 100 MG capsule [id number] Yes Take 1 capsule (100 mg total) by mouth at bedtime. Sater, Nanine Means, MD Taking Active            Med Note Broadus John, Trude Mcburney   Fri Jan 21, 2021  9:58 AM) Patient takes very rarely and only as needed for severe LE spasms because "it causes to be knocked out"             Patient Active Problem List   Diagnosis Date Noted   Vitamin D deficiency 06/14/2020   B12 deficiency 03/22/2020   Left-sided chest wall pain 12/18/2019   Spasticity 08/18/2019   Sleep apnea 07/18/2019   Heart failure ([place]) 07/09/2019   Superficial phlebitis 06/27/2019   Bilateral hand numbness 01/14/2019   RLQ abdominal pain 10/22/2018   Pedal edema 06/04/2018   Gait disturbance 06/04/2018   Bilateral knee pain 01/07/2018   Left leg weakness 12/26/2017   Conjunctivitis 10/30/2017   Dental caries 07/30/2017   Dyspnea on exertion 07/30/2017   Morbid obesity ([place]) 12/18/2016   Dysesthesia 10/11/2016   High risk medication use 07/04/2016   Multiple sclerosis ([place]) 06/19/2016   Neurogenic bowel    Neurogenic bladder    Transverse myelitis ([place]) 06/14/2016   Allergic rhinitis 04/12/2016   Sciatica associated with disorder of lumbosacral spine 01/15/2014   GERD (gastroesophageal reflux disease) 07/21/2013   RBBB on EKG 08/01/2012   IBS (irritable bowel syndrome) 06/19/2012   Healthcare maintenance 06/19/2012     Care Plan : RN Care Manager Plan Of Care  Updates made by Barrington Ellison, RN since 01/21/2021 12:00 AM     Problem: Knowledge Deficit and Care Coordination Needs Related to Management of Multiple Sclerosis   Priority: High     Goal: Development of Plan Of Care to Address Care Coordination Needs and Knowledge Deficits for Management of Multiple Sclerosis   Start Date: 01/21/2021  Expected End Date: 05/26/2021   Priority: High  Note:   Current Barriers:  Knowledge Deficits related to plan of care for management of Multiple Sclerosis  Care Coordination needs  related to Lacks knowledge of community resource: dental care  Patient states as long as she takes her medications for MS and receives her Tysabri infusions her MS is well controlled which allows her to remain independent in IADLs and ADLs.  She is requesting that her chart be made confidential.   RNCM Clinical Goal(s):  Patient will demonstrate ongoing adherence to prescribed treatment plan for multiple sclerosis as evidenced by no inpatient hospitalizations or ED visits due to MS exacerbation or patient reports of MS symptom exacerbation through collaboration with RN Care manager, provider, and care team.   Interventions: Mailed health education on MS and neurogenic bladder, managed Medicaid health plan summary of benefits and [place] Management calendar and instructions on how to make chart confidential After consulting with [place]- patient's chart was made confidential Inter-disciplinary care team collaboration (see longitudinal plan of care) Evaluation of current treatment plan related to  self management and patient's adherence to plan as established by provider   Multiple Sclerosis  (Status: New goal.) Long Term Goal  Evaluation of current treatment plan related to  multiple sclerosis , Lacks knowledge of community resource: related to dental care services  self-management and patient's adherence to plan as established by provider. Discussed plans with patient for ongoing care management follow up and provided patient with direct contact information for care management team Provided education to patient re: MS and summary of managed Medicaid plan benefits; Reviewed medications with patient and discussed importance of adherence to prevent MS exacerbation; Care Guide referral for  community resources for dental care; Advised patient to discuss Rx for gloves, home exercise equipment and incontinence supplies  with provider; Assessed social determinant of health barriers;  Encourage patient to call member services for [facility] plan to discuss plan benefit details related to phone program, DME coverage of gloves, home exercise equipment and urinary incontinence supplies  Patient Goals/Self-Care Activities: Take medications as prescribed   Attend all scheduled provider appointments Call pharmacy for medication refills 3-7 days in advance of running out of medications Perform all self care activities independently  Perform IADL's (shopping, preparing meals, housekeeping, managing finances) independently Call provider office for new concerns or questions  Read health education and health plan benefits received in mail Call health plan member services to discuss benefits details specific to patient's needs Work with community care guide to receive dental care resources       Follow Up:  Patient agrees to Care Plan and Follow-up.  Plan: The Managed Medicaid care management team will reach out to the patient again over the next 30 days.  Date/time of next scheduled RN care management/care coordination outreach:  02/22/20 At 9:30 am  [name] RN, CCM, [facility] [place] [place] [facility] Care Management Coordinator - Managed [place] High Risk [phone]

## 2021-01-21 NOTE — Addendum Note (Signed)
Addended by: Barrington Ellison on: 01/21/2021 10:58 PM   Modules accepted: Orders

## 2021-01-21 NOTE — Patient Instructions (Addendum)
Visit Information  [name] was given information about Medicaid Managed Care team care coordination services as a part of their [place] Medicaid benefit. Tamara [name] verbally consented to engagement with the [facility] Managed Care team.   If you are experiencing a medical emergency, please call 911 or report to your local emergency department or urgent care.   If you have a non-emergency medical problem during routine business hours, please contact your provider's office and ask to speak with a nurse.   For questions related to your [facility], please call: [phone] or visit the homepage here: [email or web address]  If you would like to schedule transportation through your [facility], please call the following number at least 2 days in advance of your appointment: [phone].   Call the [place] at [phone], at any time, 24 hours a day, 7 days a week. If you are in danger or need immediate medical attention call 911.  If you would like help to quit smoking, call 1-800-QUIT-NOW 618 455 5820) OR Espaol: 1-855-Djelo-Ya (9-892-119-4174) o para ms informacin haga clic aqu or Text READY to 200-400 to register via text  Tamara [name] - following are the goals we discussed in your visit today:   Goals Addressed   Take medications as prescribed   Attend all scheduled provider appointments Call pharmacy for medication refills 3-7 days in advance of running out of medications Perform all self care activities independently  Perform IADL's (shopping, preparing meals, housekeeping, managing finances) independently Call provider office for new concerns or questions  Read health education and health plan benefits received in mail Call health plan member services to discuss benefits details specific to patient's needs Work with community care  guide to receive dental care resources     Please see education materials related to MS and neurogenic bladder and summary of [facility] Managed Medicaid health plan summary of benefits provided as print materials.   The patient verbalized understanding of instructions provided today and agreed to receive a mailed copy of patient instruction and/or educational materials.  The Managed Medicaid care management team will reach out to the patient again over the next 30 days.   [name] RN, CCM, [place] Management Coordinator - Managed [place] High Risk [phone]   Following is a copy of your plan of care:  Care Plan : [place]  Updates made by Barrington Ellison, RN since 01/21/2021 12:00 AM     Problem: Knowledge Deficit and Care Coordination Needs Related to Management of Multiple Sclerosis   Priority: High     Goal: Development of Plan Of Care to Address Care Coordination Needs and Knowledge Deficits for Management of Multiple Sclerosis   Start Date: 01/21/2021  Expected End Date: 05/26/2021  Priority: High  Note:   Current Barriers:  Knowledge Deficits related to plan of care for management of Multiple Sclerosis  Care Coordination needs related to Lacks knowledge of community resource: dental care  Patient states as long as she takes her medications for MS and receives her Tysabri infusions her MS is well controlled which allows her to remain independent in IADLs and ADLs.  She is requesting that her chart be made confidential.  RNCM Clinical Goal(s):  Patient will demonstrate ongoing adherence to prescribed treatment plan for multiple sclerosis as evidenced by no inpatient hospitalizations or ED visits due to MS exacerbation or patient reports of MS  symptom exacerbation through collaboration with RN Care manager, provider, and care team.   Interventions: Mailed health education on MS and neurogenic bladder, managed  Medicaid health plan summary of benefits and [place] Management calendar and instructions on how to make chart confidential Inter-disciplinary care team collaboration (see longitudinal plan of care) Evaluation of current treatment plan related to  self management and patient's adherence to plan as established by provider   Multiple Sclerosis  (Status: New goal.) Long Term Goal  Evaluation of current treatment plan related to  multiple sclerosis , Lacks knowledge of community resource: related to dental care services  self-management and patient's adherence to plan as established by provider. Discussed plans with patient for ongoing care management follow up and provided patient with direct contact information for care management team Provided education to patient re: MS and summary of managed Medicaid plan benefits; Reviewed medications with patient and discussed importance of adherence to prevent MS exacerbation; Care Guide referral for community resources for dental care; Advised patient to discuss Rx for gloves, home exercise equipment and incontinence supplies  with provider; Assessed social determinant of health barriers;  Encourage patient to call member services for [facility] plan to discuss plan benefit details related to phone program, DME coverage of gloves, home exercise equipment and urinary incontinence supplies  Patient Goals/Self-Care Activities: Take medications as prescribed   Attend all scheduled provider appointments Call pharmacy for medication refills 3-7 days in advance of running out of medications Perform all self care activities independently  Perform IADL's (shopping, preparing meals, housekeeping, managing finances) independently Call provider office for new concerns or questions  Read health education and health plan benefits received in mail Call health plan member services to discuss benefits details  specific to patient's needs Work with community care guide to receive dental care resources

## 2021-02-03 ENCOUNTER — Telehealth: Payer: Self-pay | Admitting: *Deleted

## 2021-02-03 ENCOUNTER — Encounter: Payer: Medicaid Other | Admitting: Internal Medicine

## 2021-02-03 NOTE — Telephone Encounter (Signed)
Pt did not come to her appt this am. Called pt's home # - no answer and mailbox is full,unable to leave a message. Also called mobile # - no answer, continues to ring.

## 2021-02-07 ENCOUNTER — Telehealth: Payer: Self-pay

## 2021-02-07 ENCOUNTER — Encounter (HOSPITAL_COMMUNITY): Payer: Medicaid Other

## 2021-02-07 NOTE — Patient Instructions (Signed)
Visit Information  Tamara [name] was given information about Medicaid Managed Care team care coordination services as a part of their [place] Medicaid benefit. Tamara [name] verbally consented to engagement with the [facility] Managed Care team.   If you are experiencing a medical emergency, please call 911 or report to your local emergency department or urgent care.   If you have a non-emergency medical problem during routine business hours, please contact your provider's office and ask to speak with a nurse.   For questions related to your [facility], please call: [phone] or visit the homepage here: [email or web address]  If you would like to schedule transportation through your [facility], please call the following number at least 2 days in advance of your appointment: [phone].   Call the [place] at [phone], at any time, 24 hours a day, 7 days a week. If you are in danger or need immediate medical attention call 911.  If you would like help to quit smoking, call 1-800-QUIT-NOW (603)680-4027) OR Espaol: 1-855-Djelo-Ya (3-159-458-5929) o para ms informacin haga clic aqu or Text READY to 200-400 to register via text  Tamara [name] - following are the goals we discussed in your visit today:   Goals Addressed   None     Social Worker will follow up with patient in 30 days .   [name], BSW, [place] Managed Medicaid Team  [phone]   Following is a copy of your plan of care:  There are no care plans that you recently modified to display for this patient.

## 2021-02-07 NOTE — Patient Outreach (Signed)
Medicaid Managed Care Social Work Note  02/07/2021 Name:  Tamara [name] MRN:  [id number] DOB:  01-26-1961  Curry Dulski is an 61 y.o. year old female who is a primary patient of Lucious Groves, DO.  The Medicaid Managed Care Coordination team was consulted for assistance with:  Community Resources   Tamara [name] was given information about Medicaid Managed Care Coordination team services today. Tamara [name] Patient agreed to services and verbal consent obtained.  Engaged with patient  for by telephone forinitial visit in response to referral for case management and/or care coordination services.   Assessments/Interventions:  Review of past medical history, allergies, medications, health status, including review of consultants reports, laboratory and other test data, was performed as part of comprehensive evaluation and provision of chronic care management services.  SDOH: (Social Determinant of Health) assessments and interventions performed: BSW received a referral for patient for assistance with dental resources and a  dermatologist that specializes in [place]. Patient stated she would like those resources to be sent to her email at [email or web address]. Resources will be sent today.   Advanced Directives Status:  Not addressed in this encounter.  Care Plan                 Allergies  Allergen Reactions   Codeine Other (See Comments)    Delusions   Dilaudid [Hydromorphone Hcl] Swelling and Other (See Comments)    Tongue swells    Ditropan [Oxybutynin] Other (See Comments)    Burning sensation   Other Swelling and Other (See Comments)    Unnamed gel or antiseptic solution- Was applied to IV site with a needle- Turned the skin "black and blue" that remained (caused burning and phlebitis, also)   Paxil [Paroxetine Hcl] Other (See Comments)    Hallucinations and heavy periods    Tegretol [Carbamazepine] Itching   Trileptal [Oxcarbazepine] Swelling   Adhesive [Tape] Hives, Itching, Rash and  Other (See Comments)    PAPER TAPE   Augmentin [Amoxicillin-Pot Clavulanate] Rash   Gabapentin Rash and Other (See Comments)    Hallucinations and depression, also    Medications Reviewed Today     Reviewed by Barrington Ellison, RN (Registered Nurse) on 01/21/21 at [place] List Status: <None>   Medication Order Taking? Sig Documenting Provider Last Dose Status Informant  acetaminophen (TYLENOL) 650 MG CR tablet [id number] Yes Take 1,300 mg by mouth 3 (three) times daily as needed for pain. [provider] Taking Active Self           Med Note Duffy Bruce, Para [place] Jul 10, 2019  4:59 PM) Patient stated she does not "mix" Tylenol with Tramadol  ALPRAZolam (XANAX) 1 MG tablet [id number] No Take 1-2 tablets thirty minutes prior to MRI.  May take one additional tablet before entering scanner, if needed.  MUST HAVE DRIVER.  Patient not taking: Reported on 01/21/2021   Star Age, MD Not Taking Consider Medication Status and Discontinue (Patient Preference)   azithromycin (ZITHROMAX) 250 MG tablet [id number] No Take 1 tablet (250 mg total) by mouth daily. Take first 2 tablets together, then 1 every day until finished.  Patient not taking: Reported on 01/21/2021   Deno Etienne, DO Not Taking Consider Medication Status and Discontinue (Patient Preference)   baclofen (LIORESAL) 10 MG tablet [id number] Yes Take 10 mg by mouth 3 (three) times daily as needed for muscle spasms. [provider] Taking Active Self           Med  Note Broadus John, Trude Mcburney   Fri Jan 21, 2021 10:02 AM) Dewaine Conger rarely   benzonatate (TESSALON) 100 MG capsule [id number] No Take 1 capsule (100 mg total) by mouth every 8 (eight) hours.  Patient not taking: Reported on 01/21/2021   Deno Etienne, DO Not Taking Consider Medication Status and Discontinue   Coenzyme Q10-Fish Oil-Vit E (CO-Q 10 OMEGA-3 FISH OIL PO) [id number] Yes Take 1 capsule by mouth daily. [provider] Taking Active Self  COLLAGEN PO [id number] Yes  Take 1 Dose by mouth every other day. Powder; alternate with pill form. [provider] Taking Active Self  COLLAGEN PO [id number] Yes Take 3 tablets by mouth every other day. Pill; alternate with powder form. [provider] Taking Active Self  Disposable Gloves (NITRILE GLOVES MEDIUM) [place] [id number] Yes Use prn for bowel incontinence Sater, Nanine Means, MD Taking Active Self           Med Note Broadus John, Trude Mcburney   Fri Jan 21, 2021  9:47 AM) 5 M brand gloves  docusate sodium (COLACE) 100 MG capsule [id number] No Take 1 capsule (100 mg total) by mouth daily.  Patient not taking: Reported on 01/21/2021   Cathlyn Parsons, PA-C Not Taking Consider Medication Status and Discontinue (Patient Preference)   fluticasone (FLONASE) 50 MCG/ACT nasal spray [id number] Yes Place 2 sprays into both nostrils daily.  Patient taking differently: Place 2 sprays into both nostrils as needed for allergies.   Maudie Mercury, MD Taking Active   hyoscyamine (LEVSIN) 0.125 MG tablet [id number] Yes Take 1 tablet (0.125 mg total) by mouth 3 (three) times daily.  Patient taking differently: Take 0.125 mg by mouth as needed for bladder spasms.   Sater, Nanine Means, MD Taking Active            Med Note Broadus John, Trude Mcburney   Fri Jan 21, 2021  9:43 AM) Patient hasn't taken recent   Lacosamide (VIMPAT) 100 MG TABS [id number] No One po bid  Patient not taking: Reported on 01/21/2021   Britt Bottom, MD Not Taking Consider Medication Status and Discontinue (Patient Preference)   loratadine (CLARITIN) 10 MG tablet [id number] Yes Take 10 mg by mouth daily as needed for allergies or rhinitis. [provider] Taking Active Self  methylPREDNISolone (MEDROL DOSEPAK) 4 MG TBPK tablet [id number] No Take 6 tablets on day 1 and decrease by 1 tablet each day until finished  Patient not taking: Reported on 01/21/2021   Britt Bottom, MD Not Taking Consider Medication Status and Discontinue (Patient Preference) Self            Med Note [facility] Ts[facility]ie Billing A   Tue Sep 07, 2020  2:37 PM) Pt stated she will finish course tomorrow.   Multiple Vitamins-Minerals (HAIR/SKIN/NAILS) CAPS [id number] Yes Take 1 capsule by mouth in the morning, at noon, and at bedtime. [provider] Taking Active Self  Multiple Vitamins-Minerals (ONE-A-DAY WOMENS PO) [id number] Yes Take 1 tablet by mouth daily. [provider] Taking Active Self  natalizumab (TYSABRI) 300 MG/15ML injection [id number] Yes Inject 300 mg into the vein every 28 (twenty-eight) days. [provider] Taking Active Self           Med Note Ronnald Ramp, EMMA L   Tue Aug 05, 2019 10:33 AM)  Received fax notification that pt is re-auth for Tysabri via touch program 08/05/19-02/25/20. Account: [place]. Site auth number: [id number]. Pt enrollment number: [id number].  pantoprazole (PROTONIX) 40 MG tablet [id number] Yes  Take 1 tablet (40 mg total) by mouth daily. Lucious Groves, DO Taking Active   phentermine 37.5 MG capsule [id number] Yes Take 1 capsule by mouth once daily in the morning Britt Bottom, MD Taking Active   topiramate (TOPAMAX) 50 MG tablet [id number] Yes TAKE 1 TABLET BY MOUTH THREE TIMES DAILY Sater, Nanine Means, MD Taking Active            Med Note Broadus John, Trude Mcburney   Fri Jan 21, 2021  9:56 AM) Dewaine Conger for energy and to prevent seizures    traMADol (ULTRAM) 50 MG tablet [id number] Yes TAKE 1 TABLET BY MOUTH TWICE DAILY AS NEEDED FOR SEVERE PAIN  Patient taking differently: Take 50 mg by mouth as needed for severe pain.   Sater, Nanine Means, MD Taking Active            Med Note Broadus John, Trude Mcburney   Fri Jan 21, 2021  9:54 AM) Dewaine Conger rarely and only for severe pain and does not take with Tylenol  Vitamin D, Ergocalciferol, (DRISDOL) 1.25 MG (50000 UNIT) CAPS capsule [id number]  Take 1 capsule (50,000 Units total) by mouth every 7 (seven) days. Sater, Nanine Means, MD  Active            Med Note Broadus John, Trude Mcburney   Fri Jan 21, 2021  9:55 AM) Dewaine Conger for energy and to prevent seizures  zonisamide (ZONEGRAN) 100 MG capsule [id number] Yes Take 1 capsule (100 mg total) by mouth at bedtime. Sater, Nanine Means, MD Taking Active            Med Note Broadus John, Trude Mcburney   Fri Jan 21, 2021  9:58 AM) Patient takes very rarely and only as needed for severe LE spasms because "it causes to be knocked out"             Patient Active Problem List   Diagnosis Date Noted   Vitamin D deficiency 06/14/2020   B12 deficiency 03/22/2020   Left-sided chest wall pain 12/18/2019   Spasticity 08/18/2019   Sleep apnea 07/18/2019   Heart failure ([place]) 07/09/2019   Superficial phlebitis 06/27/2019   Bilateral hand numbness 01/14/2019   RLQ abdominal pain 10/22/2018   Pedal edema 06/04/2018   Gait disturbance 06/04/2018   Bilateral knee pain 01/07/2018   Left leg weakness 12/26/2017   Conjunctivitis 10/30/2017   Dental caries 07/30/2017   Dyspnea on exertion 07/30/2017   Morbid obesity ([place]) 12/18/2016   Dysesthesia 10/11/2016   High risk medication use 07/04/2016   Multiple sclerosis ([place]) 06/19/2016   Neurogenic bowel    Neurogenic bladder    Transverse myelitis ([place]) 06/14/2016   Allergic rhinitis 04/12/2016   Sciatica associated with disorder of lumbosacral spine 01/15/2014   GERD (gastroesophageal reflux disease) 07/21/2013   RBBB on EKG 08/01/2012   IBS (irritable bowel syndrome) 06/19/2012   Healthcare maintenance 06/19/2012    Conditions to be addressed/monitored per PCP order:   dental   There are no care plans that you recently modified to display for this patient.   Follow up:  Patient agrees to Care Plan and Follow-up.  Plan: The Managed Medicaid care management team will reach out to the patient again over the next 30 days.  Date/time of next scheduled Social Work care management/care coordination outreach:  03/10/21  [name], Arita Miss, [place] Medicaid  Team  [phone]

## 2021-02-09 ENCOUNTER — Other Ambulatory Visit: Payer: Self-pay

## 2021-02-09 ENCOUNTER — Non-Acute Institutional Stay (HOSPITAL_COMMUNITY)
Admission: RE | Admit: 2021-02-09 | Discharge: 2021-02-09 | Disposition: A | Discharge status: Home / Self Care | Payer: Medicaid Other | Source: Ambulatory Visit | Attending: Internal Medicine | Admitting: Internal Medicine

## 2021-02-09 DIAGNOSIS — G35 Multiple sclerosis: Secondary | ICD-10-CM | POA: Insufficient documentation

## 2021-02-09 MED ORDER — SODIUM CHLORIDE 0.9 % IV SOLN: INTRAVENOUS | Status: DC | PRN

## 2021-02-09 MED ORDER — SODIUM CHLORIDE 0.9 % IV SOLN
300.0000 mg | INTRAVENOUS | Status: DC
  Administered 2021-02-09: 300 mg via INTRAVENOUS
  Filled 2021-02-09: qty 15

## 2021-02-09 NOTE — Progress Notes (Signed)
PATIENT CARE CENTER NOTE   Diagnosis: Multiple Sclerosis       Provider: Arlice Colt, MD     Procedure: Fritzi Mandes 300mg      Note: Patient received Tysabri infusion ( dose #4 of 12) via PIV. Premeds not required per orders. Tolerated infusion well with no adverse reaction. Vital signs stable. Pt declined AVS. Patient declined to stay for the 1 hour post-infusion observation. Pt instructed to schedule monthly appointment at front desk piror to discharge, verbalized understanding.  Alert, oriented and ambulatory at discharge.

## 2021-02-15 ENCOUNTER — Other Ambulatory Visit: Payer: Self-pay | Admitting: Neurology

## 2021-02-15 NOTE — Telephone Encounter (Signed)
Patient is up to date on her appointments. Patient is due for a refill on phentermine. [place] Controlled Substance Registry checked and is appropriate.

## 2021-02-21 ENCOUNTER — Ambulatory Visit: Payer: Self-pay

## 2021-02-28 ENCOUNTER — Other Ambulatory Visit: Payer: Self-pay | Admitting: *Deleted

## 2021-02-28 NOTE — Patient Instructions (Signed)
Visit Information  Tamara [name] was given information about Medicaid Managed Care team care coordination services as a part of their [place] Medicaid benefit. Tamara [name] verbally consented to engagement with the [facility] Managed Care team.   If you are experiencing a medical emergency, please call 911 or report to your local emergency department or urgent care.   If you have a non-emergency medical problem during routine business hours, please contact your provider's office and ask to speak with a nurse.   For questions related to your [facility], please call: [phone] or visit the homepage here: [email or web address]  If you would like to schedule transportation through your [facility], please call the following number at least 2 days in advance of your appointment: [phone].   Call the [place] at [phone], at any time, 24 hours a day, 7 days a week. If you are in danger or need immediate medical attention call 911.  If you would like help to quit smoking, call 1-800-QUIT-NOW 204-366-9129) OR Espaol: 1-855-Djelo-Ya (8-250-539-7673) o para ms informacin haga clic aqu or Text READY to 200-400 to register via text  Tamara [name] - following are the goals we discussed in your visit today:   Goals Addressed      Patient active with My Chart and will view patient instructions as she is able.   The Managed Medicaid care management team will reach out to the patient again over the next 30 days.   Tamara Churn RN, CCM, [place] Management Coordinator - Managed [place] High Risk [phone]   Following is a copy of your plan of care:  Care Plan : [place]  Updates made by Barrington Ellison, RN since 02/28/2021 12:00 AM     Problem: Knowledge Deficit and Care Coordination  Needs Related to Management of Multiple Sclerosis   Priority: High     Goal: Development of Plan Of Care to Address Care Coordination Needs and Knowledge Deficits for Management of Multiple Sclerosis   Start Date: 01/21/2021  Expected End Date: 05/26/2021  Priority: High  Note:   Current Barriers:  Knowledge Deficits related to plan of care for management of Multiple Sclerosis  Care Coordination needs related to Lacks knowledge of community resource: dental care  Patient states as long as she takes her medications for MS and receives her Tysabri infusions her MS is well controlled which allows her to remain independent in IADLs and ADLs.  Patient states she received health information, after visit summary and [place] Management calendar in the mail and does not have any questions about the information she received.  Patient states she has not called [place] Medicaid member services to investigate DME coverage related to gloves, urinary incontinence supplies and home exercise equipment as she has been working with an attorney on regaining  custody of her granddaughter and she has court date on 03/04/21.  Patient states she did not receive dental and dermatology resources in her e-mail from [facility].   RNCM Clinical Goal(s):  Patient will demonstrate ongoing adherence to prescribed treatment plan for multiple sclerosis as evidenced by no inpatient hospitalizations or ED visits due to MS exacerbation or patient reports of MS symptom exacerbation through collaboration with RN Care manager, provider, and care team.   Interventions: Mailed health education on MS and neurogenic bladder, managed Medicaid health plan summary  of benefits and [place] Management calendar and instructions on how to make chart confidential Advised patient that after consulting with [place] on 44/03/47, her chart was made  confidential and explained the process for accessing her chart information now that it is marked confidential. Inter-disciplinary care team collaboration (see longitudinal plan of care) Evaluation of current treatment plan related to  self management and patient's adherence to plan as established by provider   Multiple Sclerosis  (Status: Goal on Track (progressing): YES.) Long Term Goal - chart review indicates patient received IV infusion of Tysabri on 1/11/3 Evaluation of current treatment plan related to  multiple sclerosis , Lacks knowledge of community resource: related to dental care services  self-management and patient's adherence to plan as established by provider. Discussed plans with patient for ongoing care management follow up and provided patient with direct contact information for care management team Provided opportunity for patient to ask questions about health information on MS , neurogenic bladder and [facility] Managed Medicaid summary of health plan benefits mailed to her on 01/21/21; Reviewed medications with patient and discussed importance of adherence to prevent MS exacerbation; Messaged Managed Medicaid BSW [name] advising her that patient did not receive dental and dermatology resources emailed to her on 02/07/21 Advised patient to discuss Rx for gloves, home exercise equipment and incontinence supplies  with provider when she sees him on 03/31/21; Again encouraged patient to call member services for [facility] plan to discuss plan benefit details related to phone program, DME coverage of gloves, home exercise equipment and urinary incontinence supplies  Patient Goals/Self-Care Activities: Take medications as prescribed   Attend all scheduled provider appointments Call pharmacy for medication refills 3-7 days in advance of running out of medications Perform all self care activities independently  Perform IADL's (shopping, preparing meals,  housekeeping, managing finances) independently Call provider office for new concerns or questions  Call health plan member services to discuss benefits details specific to patient's needs Work with managed Medicaid BSW [name] to receive dental and dermatology care resources

## 2021-02-28 NOTE — Patient Outreach (Signed)
Medicaid Managed Care   Nurse Care Manager Note  02/28/2021 Name:  Tamara [name] MRN:  [id number] DOB:  23-Feb-1960  Tamara [name] is an 61 y.o. year old female who is a primary patient of Lucious Groves, DO.  The [facility] Managed Care Coordination team was consulted for assistance with:    MS GERD  Tamara [name] was given information about Medicaid Managed Care Coordination team services today. Tamara [name] Patient agreed to services and verbal consent obtained.  Engaged with patient by telephone for follow up visit in response to provider referral for case management and/or care coordination services.   Assessments/Interventions:  Review of past medical history, allergies, medications, health status, including review of consultants reports, laboratory and other test data, was performed as part of comprehensive evaluation and provision of chronic care management services.  SDOH (Social Determinants of Health) assessments and interventions performed:   Care Plan  Allergies  Allergen Reactions   Codeine Other (See Comments)    Delusions   Dilaudid [Hydromorphone Hcl] Swelling and Other (See Comments)    Tongue swells    Ditropan [Oxybutynin] Other (See Comments)    Burning sensation   Other Swelling and Other (See Comments)    Unnamed gel or antiseptic solution- Was applied to IV site with a needle- Turned the skin "black and blue" that remained (caused burning and phlebitis, also)   Paxil [Paroxetine Hcl] Other (See Comments)    Hallucinations and heavy periods    Tegretol [Carbamazepine] Itching   Trileptal [Oxcarbazepine] Swelling   Adhesive [Tape] Hives, Itching, Rash and Other (See Comments)    PAPER TAPE   Augmentin [Amoxicillin-Pot Clavulanate] Rash   Gabapentin Rash and Other (See Comments)    Hallucinations and depression, also    Medications Reviewed Today     Reviewed by Barrington Ellison, RN (Registered Nurse) on 02/28/21 at [place] List Status: <None>   Medication  Order Taking? Sig Documenting Provider Last Dose Status Informant  acetaminophen (TYLENOL) 650 MG CR tablet [id number] No Take 1,300 mg by mouth 3 (three) times daily as needed for pain. [provider] Taking Active Self           Med Note Duffy Bruce, Para [place] Jul 10, 2019  4:59 PM) Patient stated she does not "mix" Tylenol with Tramadol  ALPRAZolam (XANAX) 1 MG tablet [id number] No Take 1-2 tablets thirty minutes prior to MRI.  May take one additional tablet before entering scanner, if needed.  MUST HAVE DRIVER.  Patient not taking: Reported on 01/21/2021   Star Age, MD Not Taking Active   azithromycin (ZITHROMAX) 250 MG tablet [id number] No Take 1 tablet (250 mg total) by mouth daily. Take first 2 tablets together, then 1 every day until finished.  Patient not taking: Reported on 01/21/2021   Deno Etienne, DO Not Taking Active   baclofen (LIORESAL) 10 MG tablet [id number] No Take 10 mg by mouth 3 (three) times daily as needed for muscle spasms. [provider] Taking Active Self           Med Note Broadus John, Trude Mcburney   Fri Jan 21, 2021 10:02 AM) Takes rarely   benzonatate (TESSALON) 100 MG capsule [id number] No Take 1 capsule (100 mg total) by mouth every 8 (eight) hours.  Patient not taking: Reported on 01/21/2021   Deno Etienne, DO Not Taking Active   Coenzyme Q10-Fish Oil-Vit E (CO-Q 10 OMEGA-3 FISH OIL PO) [id number] No Take 1 capsule by mouth daily. [provider] Taking Active Self  COLLAGEN PO [id number] No Take 1 Dose by mouth every other day. Powder; alternate with pill form. [provider] Taking Active Self  COLLAGEN PO [id number] No Take 3 tablets by mouth every other day. Pill; alternate with powder form. [provider] Taking Active Self  Disposable Gloves (NITRILE GLOVES MEDIUM) [place] [id number] No Use prn for bowel incontinence Sater, Nanine Means, MD Taking Active Self           Med Note Broadus John, Trude Mcburney   Fri Jan 21, 2021  9:47 AM) 5 M brand  gloves  docusate sodium (COLACE) 100 MG capsule [id number] No Take 1 capsule (100 mg total) by mouth daily.  Patient not taking: Reported on 01/21/2021   Cathlyn Parsons, PA-C Not Taking Active   fluticasone [facility]) 50 MCG/ACT nasal spray [id number] No Place 2 sprays into both nostrils daily.  Patient taking differently: Place 2 sprays into both nostrils as needed for allergies.   Maudie Mercury, MD Taking Active   hyoscyamine (LEVSIN) 0.125 MG tablet [id number] No Take 1 tablet (0.125 mg total) by mouth 3 (three) times daily.  Patient taking differently: Take 0.125 mg by mouth as needed for bladder spasms.   Sater, Nanine Means, MD Taking Active            Med Note Broadus John, Trude Mcburney   Fri Jan 21, 2021  9:43 AM) Patient hasn't taken recent   Lacosamide (VIMPAT) 100 MG TABS [id number] No One po bid  Patient not taking: Reported on 01/21/2021   Britt Bottom, MD Not Taking Active   loratadine (CLARITIN) 10 MG tablet [id number] No Take 10 mg by mouth daily as needed for allergies or rhinitis. [provider] Taking Active Self  methylPREDNISolone (MEDROL DOSEPAK) 4 MG TBPK tablet [id number] No Take 6 tablets on day 1 and decrease by 1 tablet each day until finished  Patient not taking: Reported on 01/21/2021   Britt Bottom, MD Not Taking Active Self           Med Note [facility] Tsosie Billing A   Tue Sep 07, 2020  2:37 PM) Pt stated she will finish course tomorrow.   Multiple Vitamins-Minerals (HAIR/SKIN/NAILS) CAPS [id number] No Take 1 capsule by mouth in the morning, at noon, and at bedtime. [provider] Taking Active Self  Multiple Vitamins-Minerals (ONE-A-DAY WOMENS PO) [id number] No Take 1 tablet by mouth daily. [provider] Taking Active Self  natalizumab (TYSABRI) 300 MG/15ML injection [id number] No Inject 300 mg into the vein every 28 (twenty-eight) days. [provider] Taking Active Self           Med Note Ronnald Ramp, EMMA L   Tue Aug 05, 2019 10:33  AM)  Received fax notification that pt is re-auth for Tysabri via touch program 08/05/19-02/25/20. Account: [place]. Site auth number: [id number]. Pt enrollment number: [id number].  pantoprazole (PROTONIX) 40 MG tablet [id number] No Take 1 tablet (40 mg total) by mouth daily. Lucious Groves, DO Taking Active   phentermine 37.5 MG capsule [id number]  Take 1 capsule by mouth once daily in the morning Britt Bottom, MD  Active   topiramate (TOPAMAX) 50 MG tablet [id number] No TAKE 1 TABLET BY MOUTH THREE TIMES DAILY Sater, Nanine Means, MD Taking Active            Med Note Broadus John, Trude Mcburney   Fri Jan 21, 2021  9:56 AM) Dewaine Conger for energy and to prevent  seizures    traMADol (ULTRAM) 50 MG tablet [id number] No TAKE 1 TABLET BY MOUTH TWICE DAILY AS NEEDED FOR SEVERE PAIN  Patient taking differently: Take 50 mg by mouth as needed for severe pain.   Sater, Nanine Means, MD Taking Active            Med Note Broadus John, Trude Mcburney   Fri Jan 21, 2021  9:54 AM) Dewaine Conger rarely and only for severe pain and does not take with Tylenol  Vitamin D, Ergocalciferol, (DRISDOL) 1.25 MG (50000 UNIT) CAPS capsule [id number]  Take 1 capsule (50,000 Units total) by mouth every 7 (seven) days. Sater, Nanine Means, MD  Active            Med Note Broadus John, Trude Mcburney   Fri Jan 21, 2021  9:55 AM) Dewaine Conger for energy and to prevent seizures  zonisamide (ZONEGRAN) 100 MG capsule [id number] No Take 1 capsule (100 mg total) by mouth at bedtime. Sater, Nanine Means, MD Taking Active            Med Note Broadus John, Trude Mcburney   Fri Jan 21, 2021  9:58 AM) Patient takes very rarely and only as needed for severe LE spasms because "it causes to be knocked out"             Patient Active Problem List   Diagnosis Date Noted   Vitamin D deficiency 06/14/2020   B12 deficiency 03/22/2020   Left-sided chest wall pain 12/18/2019   Spasticity 08/18/2019   Sleep apnea 07/18/2019   Heart failure ([place]) 07/09/2019   Superficial phlebitis  06/27/2019   Bilateral hand numbness 01/14/2019   RLQ abdominal pain 10/22/2018   Pedal edema 06/04/2018   Gait disturbance 06/04/2018   Bilateral knee pain 01/07/2018   Left leg weakness 12/26/2017   Conjunctivitis 10/30/2017   Dental caries 07/30/2017   Dyspnea on exertion 07/30/2017   Morbid obesity ([place]) 12/18/2016   Dysesthesia 10/11/2016   High risk medication use 07/04/2016   Multiple sclerosis ([place]) 06/19/2016   Neurogenic bowel    Neurogenic bladder    Transverse myelitis ([place]) 06/14/2016   Allergic rhinitis 04/12/2016   Sciatica associated with disorder of lumbosacral spine 01/15/2014   GERD (gastroesophageal reflux disease) 07/21/2013   RBBB on EKG 08/01/2012   IBS (irritable bowel syndrome) 06/19/2012   Healthcare maintenance 06/19/2012    Care Plan : RN Care Manager Plan Of Care  Updates made by Barrington Ellison, RN since 02/28/2021 12:00 AM     Problem: Knowledge Deficit and Care Coordination Needs Related to Management of Multiple Sclerosis   Priority: High     Goal: Development of Plan Of Care to Address Care Coordination Needs and Knowledge Deficits for Management of Multiple Sclerosis   Start Date: 01/21/2021  Expected End Date: 05/26/2021  Priority: High  Note:   Current Barriers:  Knowledge Deficits related to plan of care for management of Multiple Sclerosis  Care Coordination needs related to Lacks knowledge of community resource: dental care  Patient states as long as she takes her medications for MS and receives her Tysabri infusions her MS is well controlled which allows her to remain independent in IADLs and ADLs.  Patient states she received health information, after visit summary and [place] Management calendar in the mail and does not have any questions about the information she received.  Patient states she has not called [place] Medicaid member services to investigate DME coverage related to gloves, urinary  incontinence supplies and home exercise equipment as she has been working with an attorney on regaining  custody of her granddaughter and she has court date on 03/04/21.  Patient states she did not receive dental and dermatology resources in her e-mail from [facility].   RNCM Clinical Goal(s):  Patient will demonstrate ongoing adherence to prescribed treatment plan for multiple sclerosis as evidenced by no inpatient hospitalizations or ED visits due to MS exacerbation or patient reports of MS symptom exacerbation through collaboration with RN Care manager, provider, and care team.   Interventions: Mailed health education on MS and neurogenic bladder, managed Medicaid health plan summary of benefits and [place] Management calendar and instructions on how to make chart confidential Advised patient that after consulting with [place] on 34/19/37, her chart was made confidential and explained the process for accessing her chart information now that it is marked confidential. Inter-disciplinary care team collaboration (see longitudinal plan of care) Evaluation of current treatment plan related to  self management and patient's adherence to plan as established by provider   Multiple Sclerosis  (Status: Goal on Track (progressing): YES.) Long Term Goal - chart review indicates patient received IV infusion of Tysabri on 1/11/3 Evaluation of current treatment plan related to  multiple sclerosis , Lacks knowledge of community resource: related to dental care services  self-management and patient's adherence to plan as established by provider. Discussed plans with patient for ongoing care management follow up and provided patient with direct contact information for care management team Provided opportunity for patient to ask questions about health information on MS , neurogenic bladder and [facility] Managed Medicaid summary of health plan benefits  mailed to her on 01/21/21; Reviewed medications with patient and discussed importance of adherence to prevent MS exacerbation; Messaged Managed Medicaid BSW [name] advising her that patient did not receive dental and dermatology resources emailed to her on 02/07/21 Advised patient to discuss Rx for gloves, home exercise equipment and incontinence supplies  with provider when she sees him on 03/31/21; Again encouraged patient to call member services for [facility] plan to discuss plan benefit details related to phone program, DME coverage of gloves, home exercise equipment and urinary incontinence supplies  Patient Goals/Self-Care Activities: Take medications as prescribed   Attend all scheduled provider appointments Call pharmacy for medication refills 3-7 days in advance of running out of medications Perform all self care activities independently  Perform IADL's (shopping, preparing meals, housekeeping, managing finances) independently Call provider office for new concerns or questions  Call health plan member services to discuss benefits details specific to patient's needs Work with managed Medicaid BSW [name] to receive dental and dermatology care resources       Follow Up:  Patient agrees to Care Plan and Follow-up.  Plan: The Managed Medicaid care management team will reach out to the patient again over the next 30 days.  Date/time of next scheduled RN care management/care coordination outreach:  03/28/21 at 9:30 am  [name] RN, CCM, [place] Management Coordinator - Managed [place] High Risk [phone]

## 2021-03-09 ENCOUNTER — Non-Acute Institutional Stay (HOSPITAL_COMMUNITY)
Admission: RE | Admit: 2021-03-09 | Discharge: 2021-03-09 | Disposition: A | Discharge status: Home / Self Care | Payer: Medicaid Other | Source: Ambulatory Visit | Attending: Internal Medicine | Admitting: Internal Medicine

## 2021-03-09 ENCOUNTER — Inpatient Hospital Stay (HOSPITAL_COMMUNITY)
Admission: RE | Admit: 2021-03-09 | Discharge: 2021-03-09 | Disposition: A | Discharge status: Home / Self Care | Payer: Medicaid Other | Source: Ambulatory Visit

## 2021-03-09 ENCOUNTER — Other Ambulatory Visit: Payer: Self-pay

## 2021-03-09 DIAGNOSIS — R531 Weakness: Secondary | ICD-10-CM | POA: Diagnosis not present

## 2021-03-09 DIAGNOSIS — G35 Multiple sclerosis: Secondary | ICD-10-CM | POA: Insufficient documentation

## 2021-03-09 DIAGNOSIS — M5430 Sciatica, unspecified side: Secondary | ICD-10-CM | POA: Insufficient documentation

## 2021-03-09 MED ORDER — SODIUM CHLORIDE 0.9 % IV SOLN: INTRAVENOUS | Status: DC | PRN

## 2021-03-09 MED ORDER — SODIUM CHLORIDE 0.9 % IV SOLN
300.0000 mg | INTRAVENOUS | Status: DC
  Administered 2021-03-09: 300 mg via INTRAVENOUS
  Filled 2021-03-09: qty 15

## 2021-03-09 NOTE — Progress Notes (Signed)
Patient received IV Tysabri as ordered by Arlice Colt, MD. Declined the 60 minutes post infusion observation.Tolerated well, vitals stable, declined printed AVS, verbalized understanding. Alert, oriented and ambulatory at the time of discharge.

## 2021-03-10 ENCOUNTER — Other Ambulatory Visit: Payer: Self-pay

## 2021-03-10 NOTE — Patient Instructions (Signed)
Visit Information  Tamara [name] was given information about Medicaid Managed Care team care coordination services as a part of their [place] Medicaid benefit. Tamara [name] verbally consented to engagement with the [facility] Managed Care team.   If you are experiencing a medical emergency, please call 911 or report to your local emergency department or urgent care.   If you have a non-emergency medical problem during routine business hours, please contact your provider's office and ask to speak with a nurse.   For questions related to your [facility], please call: [phone] or visit the homepage here: [email or web address]  If you would like to schedule transportation through your [facility], please call the following number at least 2 days in advance of your appointment: [phone].   Call the [place] at [phone], at any time, 24 hours a day, 7 days a week. If you are in danger or need immediate medical attention call 911.  If you would like help to quit smoking, call 1-800-QUIT-NOW 450-074-9907) OR Espaol: 1-855-Djelo-Ya (5-631-497-0263) o para ms informacin haga clic aqu or Text READY to 200-400 to register via text  Tamara [name] - following are the goals we discussed in your visit today:   Goals Addressed   None     Social Worker will follow up in 30 days.   Tamara [name], BSW, [place]  High Risk Managed Medicaid Team  [phone]   Following is a copy of your plan of care:  Care Plan : [name]  Updates made by Ethelda Chick since 03/10/2021 12:00 AM     Problem: Knowledge Deficit and Care Coordination Needs Related to Management of Multiple Sclerosis   Priority: High     Goal: Development of Plan Of Care to Address Care Coordination Needs and Knowledge  Deficits for Management of Multiple Sclerosis   Start Date: 01/21/2021  Expected End Date: 05/26/2021  Priority: High  Note:   Current Barriers:  Knowledge Deficits related to plan of care for management of Multiple Sclerosis  Care Coordination needs related to Lacks knowledge of community resource: dental care  Patient states as long as she takes her medications for MS and receives her Tysabri infusions her MS is well controlled which allows her to remain independent in IADLs and ADLs.  Patient states she received health information, after visit summary and [place] Management calendar in the mail and does not have any questions about the information she received.  Patient states she has not called [place] Medicaid member services to investigate DME coverage related to gloves, urinary incontinence supplies and home exercise equipment as she has been working with an attorney on regaining  custody of her granddaughter and she has court date on 03/04/21.  Patient states she did not receive dental and dermatology resources in her e-mail from [facility].   RNCM Clinical Goal(s):  Patient will demonstrate ongoing adherence to prescribed treatment plan for multiple sclerosis as evidenced by no inpatient hospitalizations or ED visits due to MS exacerbation or patient reports of MS symptom exacerbation through collaboration with RN Care manager, provider, and care team.   Interventions: Mailed health education on MS and neurogenic bladder, managed Medicaid health plan summary of benefits and [place] Management calendar and instructions on how to make chart confidential Advised patient that after consulting with [facility]  Health IT Application Analyst on 71/24/58, her chart was made confidential and explained the process for accessing her chart information now that it is marked confidential. Inter-disciplinary care team collaboration (see  longitudinal plan of care) Evaluation of current treatment plan related to  self management and patient's adherence to plan as established by provider 03/10/21: BSW completed follow up with patient. She stated she did receive the resources via email, but has not contacted them yet. She stated her husband just passed away so she is taking care of funeral arrangements. She states she is okay, they were separated. Patient states no other resources are needed at this time, but will still like follow up from BSW.    Multiple Sclerosis  (Status: Goal on Track (progressing): YES.) Long Term Goal - chart review indicates patient received IV infusion of Tysabri on 1/11/3 Evaluation of current treatment plan related to  multiple sclerosis , Lacks knowledge of community resource: related to dental care services  self-management and patient's adherence to plan as established by provider. Discussed plans with patient for ongoing care management follow up and provided patient with direct contact information for care management team Provided opportunity for patient to ask questions about health information on MS , neurogenic bladder and [facility] Managed Medicaid summary of health plan benefits mailed to her on 01/21/21; Reviewed medications with patient and discussed importance of adherence to prevent MS exacerbation; Messaged Managed Medicaid BSW Tamara [name] advising her that patient did not receive dental and dermatology resources emailed to her on 02/07/21 Advised patient to discuss Rx for gloves, home exercise equipment and incontinence supplies  with provider when she sees him on 03/31/21; Again encouraged patient to call member services for [facility] plan to discuss plan benefit details related to phone program, DME coverage of gloves, home exercise equipment and urinary incontinence supplies  Patient Goals/Self-Care Activities: Take medications as prescribed   Attend all scheduled  provider appointments Call pharmacy for medication refills 3-7 days in advance of running out of medications Perform all self care activities independently  Perform IADL's (shopping, preparing meals, housekeeping, managing finances) independently Call provider office for new concerns or questions  Call health plan member services to discuss benefits details specific to patient's needs Work with managed Medicaid BSW Tamara [name] to receive dental and dermatology care resources

## 2021-03-10 NOTE — Patient Outreach (Signed)
Medicaid Managed Care Social Work Note  03/10/2021 Name:  Tamara [name] MRN:  [id number] DOB:  30-Aug-1960  Tamara [name] is an 61 y.o. year old female who is a primary patient of Lucious Groves, DO.  The [facility] Managed Care Coordination team was consulted for assistance with:   dental resources  Ms. Chicoine was given information about Medicaid Managed Care Coordination team services today. [name] Patient agreed to services and verbal consent obtained.  Engaged with patient  for by telephone forfollow up visit in response to referral for case management and/or care coordination services.   Assessments/Interventions:  Review of past medical history, allergies, medications, health status, including review of consultants reports, laboratory and other test data, was performed as part of comprehensive evaluation and provision of chronic care management services.  SDOH: (Social Determinant of Health) assessments and interventions performed: 03/10/21: BSW completed follow up with patient. She stated she did receive the resources via email, but has not contacted them yet. She stated her husband just passed away so she is taking care of funeral arrangements. She states she is okay, they were separated. Patient states no other resources are needed at this time, but will still like follow up from BSW.   Advanced Directives Status:  Not addressed in this encounter.  Care Plan                 Allergies  Allergen Reactions   Codeine Other (See Comments)    Delusions   Dilaudid [Hydromorphone Hcl] Swelling and Other (See Comments)    Tongue swells    Ditropan [Oxybutynin] Other (See Comments)    Burning sensation   Other Swelling and Other (See Comments)    Unnamed gel or antiseptic solution- Was applied to IV site with a needle- Turned the skin "black and blue" that remained (caused burning and phlebitis, also)   Paxil [Paroxetine Hcl] Other (See Comments)    Hallucinations and heavy periods     Tegretol [Carbamazepine] Itching   Trileptal [Oxcarbazepine] Swelling   Adhesive [Tape] Hives, Itching, Rash and Other (See Comments)    PAPER TAPE   Augmentin [Amoxicillin-Pot Clavulanate] Rash   Gabapentin Rash and Other (See Comments)    Hallucinations and depression, also    Medications Reviewed Today     Reviewed by Barrington Ellison, RN (Registered Nurse) on 02/28/21 at [place] List Status: <None>   Medication Order Taking? Sig Documenting Provider Last Dose Status Informant  acetaminophen (TYLENOL) 650 MG CR tablet [id number] No Take 1,300 mg by mouth 3 (three) times daily as needed for pain. [provider] Taking Active Self           Med Note Duffy Bruce, Para Skeans Jul 10, 2019  4:59 PM) Patient stated she does not "mix" Tylenol with Tramadol  ALPRAZolam (XANAX) 1 MG tablet [id number] No Take 1-2 tablets thirty minutes prior to MRI.  May take one additional tablet before entering scanner, if needed.  MUST HAVE DRIVER.  Patient not taking: Reported on 01/21/2021   Star Age, MD Not Taking Active   azithromycin (ZITHROMAX) 250 MG tablet [id number] No Take 1 tablet (250 mg total) by mouth daily. Take first 2 tablets together, then 1 every day until finished.  Patient not taking: Reported on 01/21/2021   Deno Etienne, DO Not Taking Active   baclofen (LIORESAL) 10 MG tablet [id number] No Take 10 mg by mouth 3 (three) times daily as needed for muscle spasms. [provider] Taking  Active Self           Med Note Broadus John, Trude Mcburney   Fri Jan 21, 2021 10:02 AM) Dewaine Conger rarely   benzonatate (TESSALON) 100 MG capsule [id number] No Take 1 capsule (100 mg total) by mouth every 8 (eight) hours.  Patient not taking: Reported on 01/21/2021   Deno Etienne, DO Not Taking Active   Coenzyme Q10-Fish Oil-Vit E (CO-Q 10 OMEGA-3 FISH OIL PO) [id number] No Take 1 capsule by mouth daily. [provider] Taking Active Self  COLLAGEN PO [id number] No Take 1 Dose by mouth every other day.  Powder; alternate with pill form. [provider] Taking Active Self  COLLAGEN PO [id number] No Take 3 tablets by mouth every other day. Pill; alternate with powder form. [provider] Taking Active Self  Disposable Gloves (NITRILE GLOVES MEDIUM) Melbeta [id number] No Use prn for bowel incontinence Sater, Nanine Means, MD Taking Active Self           Med Note Broadus John, Trude Mcburney   Fri Jan 21, 2021  9:47 AM) 5 M brand gloves  docusate sodium (COLACE) 100 MG capsule [id number] No Take 1 capsule (100 mg total) by mouth daily.  Patient not taking: Reported on 01/21/2021   Cathlyn Parsons, PA-C Not Taking Active   fluticasone [facility]) 50 MCG/ACT nasal spray [id number] No Place 2 sprays into both nostrils daily.  Patient taking differently: Place 2 sprays into both nostrils as needed for allergies.   Maudie Mercury, MD Taking Active   hyoscyamine (LEVSIN) 0.125 MG tablet [id number] No Take 1 tablet (0.125 mg total) by mouth 3 (three) times daily.  Patient taking differently: Take 0.125 mg by mouth as needed for bladder spasms.   Sater, Nanine Means, MD Taking Active            Med Note Broadus John, Trude Mcburney   Fri Jan 21, 2021  9:43 AM) Patient hasn't taken recent   Lacosamide (VIMPAT) 100 MG TABS [id number] No One po bid  Patient not taking: Reported on 01/21/2021   Britt Bottom, MD Not Taking Active   loratadine (CLARITIN) 10 MG tablet [id number] No Take 10 mg by mouth daily as needed for allergies or rhinitis. [provider] Taking Active Self  methylPREDNISolone (MEDROL DOSEPAK) 4 MG TBPK tablet [id number] No Take 6 tablets on day 1 and decrease by 1 tablet each day until finished  Patient not taking: Reported on 01/21/2021   Britt Bottom, MD Not Taking Active Self           Med Note [facility] Ts[facility]ie Billing A   Tue Sep 07, 2020  2:37 PM) Pt stated she will finish course tomorrow.   Multiple Vitamins-Minerals (HAIR/SKIN/NAILS) CAPS [id number] No Take 1 capsule by mouth in the  morning, at noon, and at bedtime. [provider] Taking Active Self  Multiple Vitamins-Minerals (ONE-A-DAY WOMENS PO) [id number] No Take 1 tablet by mouth daily. [provider] Taking Active Self  natalizumab (TYSABRI) 300 MG/15ML injection [id number] No Inject 300 mg into the vein every 28 (twenty-eight) days. [provider] Taking Active Self           Med Note Ronnald Ramp, EMMA L   Tue Aug 05, 2019 10:33 AM)  Received fax notification that pt is re-auth for Tysabri via touch program 08/05/19-02/25/20. Account: [place]. Site auth number: [id number]. Pt enrollment number: [id number].  pantoprazole (PROTONIX) 40 MG tablet [id number] No Take 1 tablet (40 mg total) by mouth daily.  Lucious Groves, DO Taking Active   phentermine 37.5 MG capsule [id number]  Take 1 capsule by mouth once daily in the morning Britt Bottom, MD  Active   topiramate (TOPAMAX) 50 MG tablet [id number] No TAKE 1 TABLET BY MOUTH THREE TIMES DAILY Sater, Nanine Means, MD Taking Active            Med Note Broadus John, Trude Mcburney   Fri Jan 21, 2021  9:56 AM) Dewaine Conger for energy and to prevent seizures    traMADol (ULTRAM) 50 MG tablet [id number] No TAKE 1 TABLET BY MOUTH TWICE DAILY AS NEEDED FOR SEVERE PAIN  Patient taking differently: Take 50 mg by mouth as needed for severe pain.   Sater, Nanine Means, MD Taking Active            Med Note Broadus John, Trude Mcburney   Fri Jan 21, 2021  9:54 AM) Dewaine Conger rarely and only for severe pain and does not take with Tylenol  Vitamin D, Ergocalciferol, (DRISDOL) 1.25 MG (50000 UNIT) CAPS capsule [id number]  Take 1 capsule (50,000 Units total) by mouth every 7 (seven) days. Sater, Nanine Means, MD  Active            Med Note Broadus John, Trude Mcburney   Fri Jan 21, 2021  9:55 AM) Dewaine Conger for energy and to prevent seizures  zonisamide (ZONEGRAN) 100 MG capsule [id number] No Take 1 capsule (100 mg total) by mouth at bedtime. Sater, Nanine Means, MD Taking Active            Med Note Broadus John,  Trude Mcburney   Fri Jan 21, 2021  9:58 AM) Patient takes very rarely and only as needed for severe LE spasms because "it causes to be knocked out"             Patient Active Problem List   Diagnosis Date Noted   Vitamin D deficiency 06/14/2020   B12 deficiency 03/22/2020   Left-sided chest wall pain 12/18/2019   Spasticity 08/18/2019   Sleep apnea 07/18/2019   Heart failure ([place]) 07/09/2019   Superficial phlebitis 06/27/2019   Bilateral hand numbness 01/14/2019   RLQ abdominal pain 10/22/2018   Pedal edema 06/04/2018   Gait disturbance 06/04/2018   Bilateral knee pain 01/07/2018   Left leg weakness 12/26/2017   Conjunctivitis 10/30/2017   Dental caries 07/30/2017   Dyspnea on exertion 07/30/2017   Morbid obesity ([place]) 12/18/2016   Dysesthesia 10/11/2016   High risk medication use 07/04/2016   Multiple sclerosis ([place]) 06/19/2016   Neurogenic bowel    Neurogenic bladder    Transverse myelitis ([place]) 06/14/2016   Allergic rhinitis 04/12/2016   Sciatica associated with disorder of lumbosacral spine 01/15/2014   GERD (gastroesophageal reflux disease) 07/21/2013   RBBB on EKG 08/01/2012   IBS (irritable bowel syndrome) 06/19/2012   Healthcare maintenance 06/19/2012    Conditions to be addressed/monitored per PCP order:   dental  Care Plan : [place]  Updates made by Ethelda Chick since 03/10/2021 12:00 AM     Problem: Knowledge Deficit and Care Coordination Needs Related to Management of Multiple Sclerosis   Priority: High     Goal: Development of Plan Of Care to Address Care Coordination Needs and Knowledge Deficits for Management of Multiple Sclerosis   Start Date: 01/21/2021  Expected End Date: 05/26/2021  Priority: High  Note:   Current Barriers:  Knowledge Deficits related to plan of care for management of Multiple Sclerosis  Care Coordination needs  related to Lacks knowledge of community resource: dental care  Patient states as long as she  takes her medications for MS and receives her Tysabri infusions her MS is well controlled which allows her to remain independent in IADLs and ADLs.  Patient states she received health information, after visit summary and [facility] calendar in the mail and does not have any questions about the information she received.  Patient states she has not called [place] Medicaid member services to investigate DME coverage related to gloves, urinary incontinence supplies and home exercise equipment as she has been working with an attorney on regaining  custody of her granddaughter and she has court date on 03/04/21.  Patient states she did not receive dental and dermatology resources in her e-mail from [facility]   RNCM Clinical Goal(s):  Patient will demonstrate ongoing adherence to prescribed treatment plan for multiple sclerosis as evidenced by no inpatient hospitalizations or ED visits due to MS exacerbation or patient reports of MS symptom exacerbation through collaboration with RN Care manager, provider, and care team.   Interventions: Mailed health education on MS and neurogenic bladder, managed Medicaid health plan summary of benefits and [place] [employer] calendar and instructions on how to make chart confidential Advised patient that after consulting with [place] on 76/73/41, her chart was made confidential and explained the process for accessing her chart information now that it is marked confidential. Inter-disciplinary care team collaboration (see longitudinal plan of care) Evaluation of current treatment plan related to  self management and patient's adherence to plan as established by provider 03/10/21: BSW completed follow up with patient. She stated she did receive the resources via email, but has not contacted them yet. She stated her husband just passed away so she is taking care of  funeral arrangements. She states she is okay, they were separated. Patient states no other resources are needed at this time, but will still like follow up from BSW.    Multiple Sclerosis  (Status: Goal on Track (progressing): YES.) Long Term Goal - chart review indicates patient received IV infusion of Tysabri on 1/11/3 Evaluation of current treatment plan related to  multiple sclerosis , Lacks knowledge of community resource: related to dental care services  self-management and patient's adherence to plan as established by provider. Discussed plans with patient for ongoing care management follow up and provided patient with direct contact information for care management team Provided opportunity for patient to ask questions about health information on MS , neurogenic bladder and [facility] Managed Medicaid summary of health plan benefits mailed to her on 01/21/21; Reviewed medications with patient and discussed importance of adherence to prevent MS exacerbation; Messaged Managed Medicaid BSW [name] advising her that patient did not receive dental and dermatology resources emailed to her on 02/07/21 Advised patient to discuss Rx for gloves, home exercise equipment and incontinence supplies  with provider when she sees him on 03/31/21; Again encouraged patient to call member services for [facility] plan to discuss plan benefit details related to phone program, DME coverage of gloves, home exercise equipment and urinary incontinence supplies  Patient Goals/Self-Care Activities: Take medications as prescribed   Attend all scheduled provider appointments Call pharmacy for medication refills 3-7 days in advance of running out of medications Perform all self care activities independently  Perform IADL's (shopping, preparing meals, housekeeping, managing finances) independently Call provider office for new concerns  or questions  Call health plan member services to discuss  benefits details specific to patient's needs Work with managed Medicaid BSW [name] to receive dental and dermatology care resources       Follow up:  Patient agrees to Care Plan and Follow-up.  Plan: The Managed Medicaid care management team will reach out to the patient again over the next 30 days.  Date/time of next scheduled Social Work care management/care coordination outreach:  04/07/21  [name], Arita Miss, [place] Medicaid Team  [phone]

## 2021-03-25 ENCOUNTER — Other Ambulatory Visit: Payer: Self-pay | Admitting: Neurology

## 2021-03-28 ENCOUNTER — Telehealth: Payer: Self-pay | Admitting: *Deleted

## 2021-03-28 ENCOUNTER — Other Ambulatory Visit: Payer: Self-pay

## 2021-03-28 NOTE — Telephone Encounter (Signed)
Received refill request for phentermine.  Last OV was on 09/09/20.  Next OV is scheduled for 06/13/21 .  Last RX was written on 02/15/21 for 30 tabs.   [place] Drug Database has been reviewed.

## 2021-03-28 NOTE — Patient Instructions (Signed)
[name] ,   The [facility] Managed Care Team is available to provide assistance to you with your healthcare needs at no cost and as a benefit of your [facility] Health plan. I'm sorry I was unable to reach you today for our scheduled appointment. Our care guide will call you to reschedule our telephone appointment. Please call me at the number below. I am available to be of assistance to you regarding your healthcare needs. .   Thank you,   [name] RN, CCM, [place] [facility] Care Management Coordinator - Managed [place] High Risk [phone]

## 2021-03-28 NOTE — Patient Outreach (Signed)
Care Coordination  03/28/2021  Tamara [name] 1960-07-19 [id number]  03/28/2021 Name: Tamara [name] MRN: [id number] DOB: [birth year]  Referred by: Lucious Groves, DO Reason for referral : High Risk Managed Medicaid (Unsuccessful RNCM follow up telephone outreach)   An unsuccessful telephone outreach was attempted today. The patient was referred to the case management team for assistance with care management and care coordination.    Follow Up Plan: A HIPAA compliant phone message was left for the patient providing contact information and requesting a return call. and The Managed Medicaid care management team will reach out to the patient again over the next 7 days.    [name] RN, CCM, [place] [facility] Care Management Coordinator - Managed [place] High Risk [phone]

## 2021-03-29 ENCOUNTER — Telehealth: Payer: Self-pay | Admitting: Internal Medicine

## 2021-03-29 NOTE — Telephone Encounter (Signed)
.. °  Medicaid Managed Care   Unsuccessful Outreach Note  03/29/2021 Name: Tamara [name] MRN: [id number] DOB: 10/08/1960  Referred by: Lucious Groves, DO Reason for referral : High Risk Managed Medicaid (I called the patient today to get her rescheduled with the MM RNCM. I left my name and number on her VM.)   An unsuccessful telephone outreach was attempted today. The patient was referred to the case management team for assistance with care management and care coordination.   Follow Up Plan: The care management team will reach out to the patient again over the next 7 days.   Thomas

## 2021-03-31 ENCOUNTER — Ambulatory Visit (INDEPENDENT_AMBULATORY_CARE_PROVIDER_SITE_OTHER): Payer: Medicaid Other | Admitting: Internal Medicine

## 2021-03-31 ENCOUNTER — Other Ambulatory Visit: Payer: Self-pay

## 2021-03-31 ENCOUNTER — Encounter: Payer: Self-pay | Admitting: Internal Medicine

## 2021-03-31 VITALS — BP 135/72 | HR 78 | Temp 98.2°F | Ht 63.0 in | Wt 185.9 lb

## 2021-03-31 DIAGNOSIS — E6609 Other obesity due to excess calories: Secondary | ICD-10-CM | POA: Diagnosis not present

## 2021-03-31 DIAGNOSIS — G35D Multiple sclerosis, unspecified: Secondary | ICD-10-CM

## 2021-03-31 DIAGNOSIS — E559 Vitamin D deficiency, unspecified: Secondary | ICD-10-CM | POA: Diagnosis not present

## 2021-03-31 DIAGNOSIS — G35 Multiple sclerosis: Secondary | ICD-10-CM

## 2021-03-31 DIAGNOSIS — Z6832 Body mass index (BMI) 32.0-32.9, adult: Secondary | ICD-10-CM | POA: Diagnosis not present

## 2021-03-31 DIAGNOSIS — Z0000 Encounter for general adult medical examination without abnormal findings: Secondary | ICD-10-CM

## 2021-03-31 DIAGNOSIS — R7303 Prediabetes: Secondary | ICD-10-CM | POA: Diagnosis not present

## 2021-03-31 NOTE — Progress Notes (Signed)
c ?Subjective:  ?HPI: ?[name] is a 61 y.o. female who presents for f/u MS, obesity ? ?She continues to follow with Dr Felecia Shelling for MS.  She remains on Tysabri, along with Vimpat. ? ?Please see Assessment and Plan below for the status of her chronic medical problems. ? ?Objective:  ?Physical Exam: ?Vitals:  ? 03/31/21 0915  ?BP: 135/72  ?Pulse: 78  ?Temp: 98.2 ?F (36.8 ?C)  ?TempSrc: Oral  ?SpO2: 100%  ?Weight: 185 lb 14.4 oz (84.3 kg)  ?Height: 5\' 3"  (1.6 m)  ? ?Body mass index is 32.93 kg/m?Marland Kitchen ?Physical Exam ?Vitals and nursing note reviewed.  ?Constitutional:   ?   Appearance: Normal appearance. She is obese.  ?Cardiovascular:  ?   Rate and Rhythm: Normal rate and regular rhythm.  ?   Heart sounds: Normal heart sounds.  ?Pulmonary:  ?   Effort: Pulmonary effort is normal.  ?   Breath sounds: Normal breath sounds.  ?Abdominal:  ?   General: Abdomen is flat.  ?   Palpations: Abdomen is soft.  ?Musculoskeletal:  ?   Right lower leg: Edema (1+) present.  ?   Left lower leg: Edema (1+) present.  ?Neurological:  ?   Mental Status: She is alert.  ?Psychiatric:     ?   Mood and Affect: Mood normal.  ? ?Assessment & Plan:  ?See Encounters Tab for problem based charting. ? ?Medications Ordered ?No orders of the defined types were placed in this encounter. ? ?Other Orders ?Orders Placed This Encounter  ?Procedures  ? MM Digital Screening  ?  Standing Status:   Future  ?  Standing Expiration Date:   04/01/2022  ?  Order Specific Question:   Reason for Exam (SYMPTOM  OR DIAGNOSIS REQUIRED)  ?  Answer:   breast cancer screening  ?  Order Specific Question:   Is the patient pregnant?  ?  Answer:   No  ?  Order Specific Question:   Preferred imaging location?  ?  Answer:   [facility]  ? Hemoglobin A1c  ? Lipid Profile  ? CMP14 + Anion Gap  ? Vitamin D (25 hydroxy)  ? Ambulatory referral to Gastroenterology  ?  Referral Priority:   Routine  ?  Referral Type:   Consultation  ?  Referral Reason:   Specialty Services Required   ?  Number of Visits Requested:   1  ? ?Follow Up: ?No follow-ups on file. ? ?

## 2021-04-01 LAB — HEMOGLOBIN A1C
Est. average glucose Bld gHb Est-mCnc: 123 mg/dL
Hgb A1c MFr Bld: 5.9 % — ABNORMAL HIGH (ref 4.8–5.6)

## 2021-04-01 LAB — LIPID PANEL
Chol/HDL Ratio: 3 ratio (ref 0.0–4.4)
Cholesterol, Total: 200 mg/dL — ABNORMAL HIGH (ref 100–199)
HDL: 66 mg/dL (ref >39)
LDL Chol Calc (NIH): 123 mg/dL — ABNORMAL HIGH (ref 0–99)
Triglycerides: 63 mg/dL (ref 0–149)
VLDL Cholesterol Cal: 11 mg/dL (ref 5–40)

## 2021-04-01 LAB — CMP14 + ANION GAP
ALT: 15 IU/L (ref 0–32)
AST: 13 IU/L (ref 0–40)
Albumin/Globulin Ratio: 2.3 — ABNORMAL HIGH (ref 1.2–2.2)
Albumin: 4.5 g/dL (ref 3.8–4.9)
Alkaline Phosphatase: 72 IU/L (ref 44–121)
Anion Gap: 12 mmol/L (ref 10.0–18.0)
BUN/Creatinine Ratio: 16 (ref 12–28)
BUN: 15 mg/dL (ref 8–27)
Bilirubin Total: <0.2 mg/dL (ref 0.0–1.2)
CO2: 23 mmol/L (ref 20–29)
Calcium: 9.4 mg/dL (ref 8.7–10.3)
Chloride: 109 mmol/L — ABNORMAL HIGH (ref 96–106)
Creatinine, Ser: 0.94 mg/dL (ref 0.57–1.00)
Globulin, Total: 2 g/dL (ref 1.5–4.5)
Glucose: 89 mg/dL (ref 70–99)
Potassium: 4.2 mmol/L (ref 3.5–5.2)
Sodium: 144 mmol/L (ref 134–144)
Total Protein: 6.5 g/dL (ref 6.0–8.5)
eGFR: 69 mL/min/{1.73_m2} (ref >59)

## 2021-04-01 LAB — VITAMIN D 25 HYDROXY (VIT D DEFICIENCY, FRACTURES): Vit D, 25-Hydroxy: 60.3 ng/mL (ref 30.0–100.0)

## 2021-04-04 NOTE — Assessment & Plan Note (Signed)
I congratulated her on weight loss.  She notes she has been eating healthier. ?

## 2021-04-04 NOTE — Assessment & Plan Note (Signed)
Check lipid panel today willing to go for colonoscopy and mammogram. ?

## 2021-04-04 NOTE — Assessment & Plan Note (Signed)
Recheck vitamin D she reports adherence to oral supplementation. ?

## 2021-04-04 NOTE — Assessment & Plan Note (Signed)
She continues to follow with Dr. Felecia Shelling, and reports to me that things are going overall well.  We will follow-up vitamin D level today.  However the majority of our conversation ended up centering around the recent death of her husband Tamara [name] and the marital turmoil that was going on before his death.  She reports she is getting over the grieving phase and ready to reorient herself and refocus on her health.  We discussed that she had multiple missed visits or late visits with me which she acknowledges were due to life stressors relating to her marriage. ?

## 2021-04-07 ENCOUNTER — Other Ambulatory Visit: Payer: Self-pay

## 2021-04-07 NOTE — Patient Instructions (Signed)
Visit Information ? ?Ms. Enriquez was given information about Medicaid Managed Care team care coordination services as a part of their [place] Medicaid benefit. Remie Mathison verbally consented to engagement with the [facility] Managed Care team.  ? ?If you are experiencing a medical emergency, please call 911 or report to your local emergency department or urgent care.  ? ?If you have a non-emergency medical problem during routine business hours, please contact your provider's office and ask to speak with a nurse.  ? ?For questions related to your [facility], please call: [phone] or visit the homepage here: [email or web address] ? ?If you would like to schedule transportation through your [facility], please call the following number at least 2 days in advance of your appointment: [phone]. ? Rides for urgent appointments can also be made after hours by calling Member Services. ? ?Call the [place] at [phone], at any time, 24 hours a day, 7 days a week. If you are in danger or need immediate medical attention call 911. ? ?If you would like help to quit smoking, call 1-800-QUIT-NOW (364)418-9607) OR Espa?ol: 1-855-D?jelo-Ya 4256212899) o para m?s informaci?n haga clic aqu? or Text READY to 200-400 to register via text ? ?Ms. Satterfield - following are the goals we discussed in your visit today:  ? Goals Addressed   ?None ?  ? ? ?Social Worker will follow up in 30 days.  ? ?. [name], BSW, [place] ?[place]  ?High Risk Managed Medicaid Team  ?[phone]  ? ?Following is a copy of your plan of care:  ?Care Plan : [place]  ?Updates made by Ethelda Chick since 04/07/2021 12:00 AM  ?  ? ?Problem: Knowledge Deficit and Care Coordination Needs Related to Management of Multiple Sclerosis   ?Priority: High  ?   ? ?Goal: Development of Plan Of Care to Address Care Coordination Needs and Knowledge Deficits for Management of Multiple Sclerosis   ?Start Date: 01/21/2021  ?Expected End Date: 05/26/2021  ?Priority: High  ?Note:   ?Current Barriers:  ?Knowledge Deficits related to plan of care for management of Multiple Sclerosis  ?Care Coordination needs related to Lacks knowledge of community resource: dental care  ?Patient states as long as she takes her medications for MS and receives her Tysabri infusions her MS is well controlled which allows her to remain independent in IADLs and ADLs.  ?Patient states she received health information, after visit summary and [place] Management calendar in the mail and does not have any questions about the information she received.  ?Patient states she has not called [place] Medicaid member services to investigate DME coverage related to gloves, urinary incontinence supplies and home exercise equipment as she has been working with an attorney on regaining  custody of her granddaughter and she has court date on 03/04/21.  ?Patient states she did not receive dental and dermatology resources in her e-mail from [facility].  ? ?RNCM Clinical Goal(s):  ?Patient will demonstrate ongoing adherence to prescribed treatment plan for multiple sclerosis as evidenced by no inpatient hospitalizations or ED visits due to MS exacerbation or patient reports of MS symptom exacerbation through collaboration with RN Care manager, provider, and care team.  ? ?Interventions: ?Mailed health education on MS and neurogenic bladder, managed Medicaid health plan summary of benefits and [place] Management calendar and instructions on  how to make chart confidential ?Advised patient that after consulting with [place] on 56/25/63, her chart was made confidential and explained the process for accessing her chart  information now that it is marked confidential. ?Inter-disciplinary care team collaboration (see longitudinal plan of care) ?Evaluation of current treatment plan related to  self management and patient's adherence to plan as established by provider ?03/10/21: BSW completed follow up with patient. She stated she did receive the resources via email, but has not contacted them yet. She stated her husband just passed away so she is taking care of funeral arrangements. She states she is okay, they were separated. Patient states no other resources are needed at this time, but will still like follow up from BSW.  ?04/07/21: BSW completed telephone outreach. Patient spoke about her husbands funeral and that she was doing good. No resources are needed, but patient would like for BSW to continue to follow up with her. ? ? ?Multiple Sclerosis  (Status: Goal on Track (progressing): YES.) Long Term Goal - chart review indicates patient received IV infusion of Tysabri on 1/11/3 ?Evaluation of current treatment plan related to  multiple sclerosis , Lacks knowledge of community resource: related to dental care services  self-management and patient's adherence to plan as established by provider. ?Discussed plans with patient for ongoing care management follow up and provided patient with direct contact information for care management team ?Provided opportunity for patient to ask questions about health information on MS , neurogenic bladder and [facility] Managed Medicaid summary of health plan benefits mailed to her on 01/21/21; ?Reviewed medications with patient and discussed importance of adherence to prevent MS exacerbation; ?Messaged Managed Medicaid BSW [name] advising her that patient did not receive dental and dermatology resources emailed to her on 02/07/21 ?Advised patient to discuss Rx for gloves, home exercise equipment and incontinence supplies  with provider when she sees him on 03/31/21; ?Again encouraged patient to call  member services for [place] plan to discuss plan benefit details related to phone program, DME coverage of gloves, home exercise equipment and urinary incontinence supplies ? ?Patient Goals/Self-Care Activities: ?Take medications as prescribed   ?Attend all scheduled provider appointments ?Call pharmacy for medication refills 3-7 days in advance of running out of medications ?Perform all self care activities independently  ?Perform IADL's (shopping, preparing meals, housekeeping, managing finances) independently ?Call provider office for new concerns or questions  ?Call health plan member services to discuss benefits details specific to patient's needs ?Work with managed Medicaid BSW [name] to receive dental and dermatology care resources ?  ?  ?  ?

## 2021-04-18 ENCOUNTER — Other Ambulatory Visit: Payer: Self-pay | Admitting: *Deleted

## 2021-04-18 DIAGNOSIS — R7303 Prediabetes: Secondary | ICD-10-CM

## 2021-04-18 NOTE — Patient Outreach (Signed)
?Medicaid Managed Care   ?Nurse Care Manager Note ? ?04/18/2021 ?Name:  Tamara [name] MRN:  [id number] DOB:  [birth year] ? ?Tamara [name] is an 61 y.o. year old female who is a primary patient of Tamara Groves, DO.  The [facility] Managed Care Coordination team was consulted for assistance with:    ?MS ?Obesity ? ?Tamara [name] was given information about Medicaid Managed Care Coordination team services today. Tamara [name] Patient agreed to services and verbal consent obtained. ? ?Engaged with patient by telephone for follow up visit in response to provider referral for case management and/or care coordination services.  ? ?Assessments/Interventions:  Review of past medical history, allergies, medications, health status, including review of consultants reports, laboratory and other test data, was performed as part of comprehensive evaluation and provision of chronic care management services. ? ?SDOH (Social Determinants of Health) assessments and interventions performed: ? ? ?Care Plan ? ?Allergies  ?Allergen Reactions  ? Codeine Other (See Comments)  ?  Delusions  ? Dilaudid [Hydromorphone Hcl] Swelling and Other (See Comments)  ?  Tongue swells ?  ? Ditropan [Oxybutynin] Other (See Comments)  ?  Burning sensation  ? Other Swelling and Other (See Comments)  ?  Unnamed gel or antiseptic solution- Was applied to IV site with a needle- Turned the skin "black and blue" that remained (caused burning and phlebitis, also)  ? Paxil [Paroxetine Hcl] Other (See Comments)  ?  Hallucinations and heavy periods ?  ? Tegretol [Carbamazepine] Itching  ? Trileptal [Oxcarbazepine] Swelling  ? Adhesive [Tape] Hives, Itching, Rash and Other (See Comments)  ?  PAPER TAPE  ? Augmentin [Amoxicillin-Pot Clavulanate] Rash  ? Gabapentin Rash and Other (See Comments)  ?  Hallucinations and depression, also  ? ? ?Medications Reviewed Today   ? ? Reviewed by Tamara Ellison, RN (Registered Nurse) on 04/18/21 at 80  Med List Status: <None>  ? ?Medication  Order Taking? Sig Documenting Provider Last Dose Status Informant  ?acetaminophen (TYLENOL) 650 MG CR tablet [id number] No Take 1,300 mg by mouth 3 (three) times daily as needed for pain. [provider] Taking Active Self  ?         ?Med Note Duffy Bruce, Legrand Como   Thu Jul 10, 2019  4:59 PM) Patient stated she does not "mix" Tylenol with Tramadol  ?ALPRAZolam (XANAX) 1 MG tablet [id number] No Take 1-2 tablets thirty minutes prior to MRI.  May take one additional tablet before entering scanner, if needed.  MUST HAVE DRIVER.  ?Patient not taking: Reported on 01/21/2021  ? Star Age, MD Not Taking Active   ?azithromycin (ZITHROMAX) 250 MG tablet [id number] No Take 1 tablet (250 mg total) by mouth daily. Take first 2 tablets together, then 1 every day until finished.  ?Patient not taking: Reported on 01/21/2021  ? Deno Etienne, DO Not Taking Active   ?baclofen (LIORESAL) 10 MG tablet [id number] No Take 10 mg by mouth 3 (three) times daily as needed for muscle spasms. [provider] Taking Active Self  ?         ?Med Note Broadus John, Trude Mcburney   Fri Jan 21, 2021 10:02 AM) Takes rarely   ?benzonatate (TESSALON) 100 MG capsule [id number] No Take 1 capsule (100 mg total) by mouth every 8 (eight) hours.  ?Patient not taking: Reported on 01/21/2021  ? Deno Etienne, DO Not Taking Active   ?Coenzyme Q10-Fish Oil-Vit E (CO-Q 10 OMEGA-3 FISH OIL PO) [id number] No Take 1 capsule by mouth daily. [provider] Taking Active Self  ?COLLAGEN PO [id number] No Take 1 Dose by mouth every other day. Powder; alternate with pill form. [provider] Taking Active Self  ?COLLAGEN PO [id number] No Take 3 tablets by mouth every other day. Pill; alternate with powder form. [provider] Taking Active Self  ?Disposable Gloves (NITRILE GLOVES MEDIUM) MISC [id number] No Use prn for bowel incontinence Sater, Nanine Means, MD Taking Active Self  ?         ?Med Note Broadus John, Trude Mcburney   Fri Jan 21, 2021  9:47 AM) 5 M brand  gloves  ?docusate sodium (COLACE) 100 MG capsule [id number] No Take 1 capsule (100 mg total) by mouth daily.  ?Patient not taking: Reported on 01/21/2021  ? Cathlyn Parsons, PA-C Not Taking Active   ?fluticasone (FLONASE) 50 MCG/ACT nasal spray [id number] No Place 2 sprays into both nostrils daily.  ?Patient taking differently: Place 2 sprays into both nostrils as needed for allergies.  ? Maudie Mercury, MD Taking Active   ?hyoscyamine (LEVSIN) 0.125 MG tablet [id number] No Take 1 tablet (0.125 mg total) by mouth 3 (three) times daily.  ?Patient taking differently: Take 0.125 mg by mouth as needed for bladder spasms.  ? Sater, Nanine Means, MD Taking Active   ?         ?Med Note Broadus John, Trude Mcburney   Fri Jan 21, 2021  9:43 AM) Patient hasn't taken recent   ?Lacosamide (VIMPAT) 100 MG TABS [id number] No One po bid  ?Patient not taking: Reported on 01/21/2021  ? Sater, Nanine Means, MD Not Taking Active   ?loratadine (CLARITIN) 10 MG tablet [id number] No Take 10 mg by mouth daily as needed for allergies or rhinitis. [provider] Taking Active Self  ?methylPREDNISolone (MEDROL DOSEPAK) 4 MG TBPK tablet [id number] No Take 6 tablets on day 1 and decrease by 1 tablet each day until finished  ?Patient not taking: Reported on 01/21/2021  ? Sater, Nanine Means, MD Not Taking Active Self  ?         ?Med Note (MORA Tsosie Billing A   Tue Sep 07, 2020  2:37 PM) Pt stated she will finish course tomorrow.   ?Multiple Vitamins-Minerals (HAIR/SKIN/NAILS) CAPS [id number] No Take 1 capsule by mouth in the morning, at noon, and at bedtime. [provider] Taking Active Self  ?Multiple Vitamins-Minerals (ONE-A-DAY WOMENS PO) [id number] No Take 1 tablet by mouth daily. [provider] Taking Active Self  ?natalizumab (TYSABRI) 300 MG/15ML injection [id number] No Inject 300 mg into the vein every 28 (twenty-eight) days. [provider] Taking Active Self  ?         ?Med Note Ronnald Ramp, EMMA L   Tue Aug 05, 2019 10:33  AM)  ?Received fax notification that pt is re-auth for Tysabri via touch program 08/05/19-02/25/20. Account: [place]. Site auth number: [id number]. Pt enrollment number: [id number].  ?pantoprazole (PROTONIX) 40 MG tablet [id number] No Take 1 tablet (40 mg total) by mouth daily. Tamara Groves, DO Taking Active   ?phentermine 37.5 MG capsule [id number]  Take 1 capsule by mouth once daily in the morning Sater, Nanine Means, MD  Active   ?topiramate (TOPAMAX) 50 MG tablet [id number] No TAKE 1 TABLET BY MOUTH THREE TIMES DAILY Sater, Nanine Means, MD Taking Active   ?         ?Med Note Broadus John, Trude Mcburney   Fri Jan 21, 2021  9:56 AM) Dewaine Conger for energy and to prevent  seizures ? ?  ?traMADol (ULTRAM) 50 MG tablet [id number] No TAKE 1 TABLET BY MOUTH TWICE DAILY AS NEEDED FOR SEVERE PAIN  ?Patient taking differently: Take 50 mg by mouth as needed for severe pain.  ? Sater, Nanine Means, MD Taking Active   ?         ?Med Note Broadus John, Trude Mcburney   Fri Jan 21, 2021  9:54 AM) Dewaine Conger rarely and only for severe pain and does not take with Tylenol  ?Vitamin D, Ergocalciferol, (DRISDOL) 1.25 MG (50000 UNIT) CAPS capsule [id number]  Take 1 capsule (50,000 Units total) by mouth every 7 (seven) days. Sater, Nanine Means, MD  Active   ?         ?Med Note Broadus John, Trude Mcburney   Fri Jan 21, 2021  9:55 AM) Dewaine Conger for energy and to prevent seizures  ?zonisamide (ZONEGRAN) 100 MG capsule [id number] No Take 1 capsule (100 mg total) by mouth at bedtime. Sater, Nanine Means, MD Taking Active   ?         ?Med Note Broadus John, Trude Mcburney   Fri Jan 21, 2021  9:58 AM) Patient takes very rarely and only as needed for severe LE spasms because "it causes to be knocked out"   ? ?  ?  ? ?  ? ? ?Patient Active Problem List  ? Diagnosis Date Noted  ? Vitamin D deficiency 06/14/2020  ? B12 deficiency 03/22/2020  ? Left-sided chest wall pain 12/18/2019  ? Spasticity 08/18/2019  ? Sleep apnea 07/18/2019  ? Bilateral hand numbness 01/14/2019  ? RLQ abdominal pain  10/22/2018  ? Pedal edema 06/04/2018  ? Gait disturbance 06/04/2018  ? Bilateral knee pain 01/07/2018  ? Conjunctivitis 10/30/2017  ? Dental caries 07/30/2017  ? Dyspnea on exertion 07/30/2017  ? Class 1 obesit

## 2021-04-18 NOTE — Patient Instructions (Signed)
Visit Information ? ?Tamara [name] was given information about Medicaid Managed Care team care coordination services as a part of their [place] Medicaid benefit. Todd Argabright verbally consented to engagement with the [facility] Managed Care team.  ? ?If you are experiencing a medical emergency, please call 911 or report to your local emergency department or urgent care.  ? ?If you have a non-emergency medical problem during routine business hours, please contact your provider's office and ask to speak with a nurse.  ? ?For questions related to your [facility], please call: [phone] or visit the homepage here: [email or web address] ? ?If you would like to schedule transportation through your [facility], please call the following number at least 2 days in advance of your appointment: [phone]. ? Rides for urgent appointments can also be made after hours by calling Member Services. ? ?Call the [place] at [phone], at any time, 24 hours a day, 7 days a week. If you are in danger or need immediate medical attention call 911. ? ?If you would like help to quit smoking, call 1-800-QUIT-NOW 858 502 7612) OR Espa?ol: 1-855-D?jelo-Ya 402 156 9213) o para m?s informaci?n haga clic aqu? or Text READY to 200-400 to register via text ? ?Tamara [name] - following are the goals we discussed in your visit today:  ? Goals Addressed   ?Take medications as prescribed   ?Attend all scheduled provider appointments ?Call pharmacy for medication refills 3-7 days in advance of running out of medications ?Perform all self care activities independently  ?Perform IADL's (shopping, preparing meals, housekeeping, managing finances) independently ?Call provider office for new concerns or questions  ?Call health plan member services to discuss benefits details specific to patient's  needs ?Schedule mammogram and colonoscopy  ? ? ? ?Please see education materials related to in network gastroenterologist and planning healthy meals  provided as print materials.  ? ?The patient verbalized understanding of instructions, educational materials, and care plan provided today and agreed to receive a mailed copy of patient instructions, educational materials, and care plan.  ? ?The Managed Medicaid care management team will reach out to the patient again over the next 30 days.  ? ?[name] RN, CCM, CDCES ?[place] [facility] ?Care Management Coordinator - Managed Medicaid High Risk ?[phone]  ? ?Following is a copy of your plan of care:  ?Care Plan : [place]  ?Updates made by Barrington Ellison, RN since 04/18/2021 12:00 AM  ?  ? ?Problem: Knowledge Deficit and Care Coordination Needs Related to Management of Multiple Sclerosis   ?Priority: High  ?  ? ?Long-Range Goal: Development of Plan Of Care to Address Care Coordination Needs and Knowledge Deficits for Management of Multiple Sclerosis   ?Start Date: 01/21/2021  ?Expected End Date: 01/21/2022  ?Priority: High  ?Note:   ?Current Barriers:  ?Knowledge Deficits related to plan of care for management of Multiple Sclerosis  ?Care Coordination needs related to Lacks knowledge of community resource: dental care - resources received via e-mail per BSW note of 03/10/21 ?Obtained patient's verbal permission for RNCM [name] to join telephone assessment of today 04/18/21 via conference call with patient and this RNCM for training  purposes ?Patient states as long as she takes her medications for MS and receives her Tysabri infusions her MS is well controlled which allows her to remain independent in IADLs and ADLs.  ?Patient states her husband , whom she was no  longer living with, died on 03/05/21 and she assisted with arranging his funeral. States family tensions remain with her husband's family.  ?Patient states she  was under much more stress when her husband was alive and is declining offer of managed Medicaid LCSW services at this time ?Patient says she saw he primary care provider on 03/31/21 and was told she is overdue for her colonoscopy and mammogram. She says she has not heard results from labs drawn on 03/31/21 ?Patient states she needs an in network provider so she may proceed with colonoscopy ? ?RNCM Clinical Goal(s):  ?Patient will demonstrate ongoing adherence to prescribed treatment plan for multiple sclerosis as evidenced by no inpatient hospitalizations or ED visits due to MS exacerbation or patient reports of MS symptom exacerbation through collaboration with RN Care manager, provider, and care team.  ? ?Interventions: ?Inter-disciplinary care team collaboration (see longitudinal plan of care) ?Evaluation of current treatment plan related to  self management and patient's adherence to plan as established by provider ?Offered services of managed Medicaid LCSW to assist patient with husband's death and stress from  husband's family  ? ? ?Multiple Sclerosis  (Status: Goal on Track (progressing): YES.) Long Term Goal - patient states she remains independent in IADLs and ADLs, has not missed any Tysabri infusions and takes her medications for MS as prescribed ?Evaluation of current treatment plan related to  multiple sclerosis ,     self-management and patient's adherence to plan as established by provider. ?Discussed plans with patient for ongoing care management follow up and provided patient with direct contact information for care management team ?Reviewed medications with patient and discussed importance of adherence to prevent MS exacerbation; ? ? ?Weight Loss Interventions:  (Status:  New goal.) Long Term Goal- patient states her after visit summary of 03/31/21 indicates she is obese; says she is interested in weight loss and preventing diabetes ?Advised patient to discuss with primary care provider options  regarding weight management ?Collaboration with provider for dietician referral ?Provided patient and/or caregiver with contact information about healthy meal planning Advice worker or dietician)  - mailed patient "Planning Healthy Meals" brochure ? ? ?Health Maintenance Interventions: ? (Status:  New goal.) Long Term Goal ?Patient interviewed about adult health maintenance status including  Colonoscopy    ?Encouraged [facility] since her last one was in 2020- ensured she has phone number ?Mailed patient list of gastroenterologist within 10 mile radius of patient's address that accept her health plan and who are accepting new patients    ? ?Patient Goals/Self-Care Activities: ?Take medications as prescribed   ?Attend all scheduled provider appointments ?Call pharmacy for medication refills 3-7 days in advance of running out of medications ?Perform all self care activities independently  ?Perform IADL's (shopping, preparing meals, housekeeping, managing finances) independently ?Call provider office for new concerns or questions  ?Call health plan member services to discuss benefits details specific to patient's needs ?Schedule mammogram and colonoscopy  ? ?  ?  ?  ?

## 2021-04-24 ENCOUNTER — Other Ambulatory Visit: Payer: Self-pay | Admitting: Neurology

## 2021-04-25 NOTE — Telephone Encounter (Signed)
Last OV was on 09/09/20.  ?Next OV is scheduled for 06/13/21 .  ?Last RX was written on 03/28/21 for 30 tabs.  ? ?[place] [facility] Database has been reviewed.  ?

## 2021-04-26 ENCOUNTER — Other Ambulatory Visit: Payer: Self-pay

## 2021-04-26 ENCOUNTER — Telehealth: Payer: Self-pay | Admitting: *Deleted

## 2021-04-26 ENCOUNTER — Telehealth: Payer: Self-pay | Admitting: Neurology

## 2021-04-26 ENCOUNTER — Ambulatory Visit: Payer: Medicaid Other

## 2021-04-26 ENCOUNTER — Non-Acute Institutional Stay (HOSPITAL_COMMUNITY)
Admission: RE | Admit: 2021-04-26 | Discharge: 2021-04-26 | Disposition: A | Discharge status: Home / Self Care | Payer: Medicaid Other | Source: Ambulatory Visit | Attending: Internal Medicine | Admitting: Internal Medicine

## 2021-04-26 DIAGNOSIS — G35 Multiple sclerosis: Secondary | ICD-10-CM | POA: Diagnosis present

## 2021-04-26 MED ORDER — SODIUM CHLORIDE 0.9 % IV SOLN: INTRAVENOUS | Status: DC | PRN

## 2021-04-26 MED ORDER — SODIUM CHLORIDE 0.9 % IV SOLN
300.0000 mg | INTRAVENOUS | Status: DC
  Administered 2021-04-26: 300 mg via INTRAVENOUS
  Filled 2021-04-26: qty 15

## 2021-04-26 NOTE — Addendum Note (Signed)
Addended by: Lucious Groves on: 04/26/2021 02:16 PM ? ? Modules accepted: Orders ? ?

## 2021-04-26 NOTE — Telephone Encounter (Signed)
Received a notification from the infusion team at [facility] patient [facility] who states that the patient went in today for infusion. ? ?"patient is here for Tysabri, this infusion is about 2 weeks past due, last infused on 2/8. We will go ahead and administer it today just wanted to make everyone aware she is receiving her dose off schedule." ?

## 2021-04-26 NOTE — Patient Outreach (Signed)
Care Coordination ? ?04/26/2021 ? ?Tamara [name] ?1960-02-17 ?[id number] ? ?Attempted to contact patient via telephone to discuss results of labs drawn on 03/31/21 and [facility] related to prediabetes ( A1C= 5.9% )and weight management per direction from Dr Heber Ricardo. ?Unable to leave message on patient's mobile number as recording states voice mailbox is full. ? ?Plan: ?Notified Dr Heber [place] of unsuccessful outreach and plans to make another outreach on 3/31 when this RNCM returns to work per part time work schedule. ? ?[name] RN, CCM, CDCES ?[place] [facility] ?Care Management Coordinator - Managed Medicaid High Risk ?[phone]  ?

## 2021-04-26 NOTE — Progress Notes (Signed)
Tamara [name] and Tamara [name], I placed the referral.  Tamara [name], I am not sure if I told her that her A1c was in the prediabetic range but if you would not mind letting her know that I would appreciate it,  (can reassure her that all that is needed at this time is weight loss and education from Tamara [name] to prevent development of diabetes) ?

## 2021-04-26 NOTE — Progress Notes (Signed)
PATIENT CARE CENTER NOTE: ?  ?Diagnosis: Multiple Sclerosis  ?  ?   ?Provider: Arlice Colt, MD ?  ?  ?Procedure: Tysabri '300mg'$  ?  ?  ?Note: Patient received Tysabri infusion ( dose # 6 of 12) via PIV. Gerline Legacy RN, staff to Dr. Felecia Shelling informed that pt is 2 weeks late for monthly infusion, indicated she will inform provider. No premeds required per orders. Tolerated infusion well with no adverse reaction. Vital signs stable. Pt declined AVS. Patient declined to stay for the 1 hour post-infusion observation. Pt instructed to schedule monthly appointment at front desk prior to leaving, verbalized understanding.  Alert, oriented and ambulatory at discharge.    ?

## 2021-04-29 ENCOUNTER — Ambulatory Visit: Payer: Self-pay

## 2021-04-29 ENCOUNTER — Telehealth: Payer: Self-pay | Admitting: *Deleted

## 2021-04-29 NOTE — Patient Outreach (Signed)
Care Coordination ? ?04/29/2021 ? ?Tamara [name] ?03/01/1960 ?[id number] ? ?Second unsuccessful telephone outreach to review results of labs done 03/31/21 and to discuss [facility] for [facility] Nutrition Therapy related to prediabetes and obesity per Dr. Jodene Nam direction. Unable to leave message as recording states voice mailbox is full. ? ?Plan: ?Notified Dr Heber [place] of second unsuccessful outreach via [employer].  Will attempt another outreach in the next 7 days. ? ?[name] RN, CCM, CDCES ?[facility] ?Care Management Coordinator - Managed Medicaid High Risk ?[phone]  ?

## 2021-05-02 ENCOUNTER — Other Ambulatory Visit: Payer: Self-pay | Admitting: *Deleted

## 2021-05-02 NOTE — Patient Outreach (Signed)
?Medicaid Managed Care   ?Nurse Care Manager Note ? ?05/02/2021 ?Name:  Tamara [name] MRN:  [id number] DOB:  Jul 18, 1960 ? ?Tamara [name] is an 61 y.o. year old female who is a primary patient of Lucious Groves, DO.  The [facility] Managed Care Coordination team was consulted for assistance with:    ?HLD ?MS ?Prediabetes ? ?Tamara [name] was given information about Medicaid Managed Care Coordination team services today. Tamara [name] Patient agreed to services and verbal consent obtained. ? ?Engaged with patient by telephone for follow up visit in response to provider referral for case management and/or care coordination services.  ? ?Assessments/Interventions:  Review of past medical history, allergies, medications, health status, including review of consultants reports, laboratory and other test data, was performed as part of comprehensive evaluation and provision of chronic care management services. ? ?SDOH (Social Determinants of Health) assessments and interventions performed: ? ? ?Care Plan ? ?Allergies  ?Allergen Reactions  ? Codeine Other (See Comments)  ?  Delusions  ? Dilaudid [Hydromorphone Hcl] Swelling and Other (See Comments)  ?  Tongue swells ?  ? Ditropan [Oxybutynin] Other (See Comments)  ?  Burning sensation  ? Other Swelling and Other (See Comments)  ?  Unnamed gel or antiseptic solution- Was applied to IV site with a needle- Turned the skin "black and blue" that remained (caused burning and phlebitis, also)  ? Paxil [Paroxetine Hcl] Other (See Comments)  ?  Hallucinations and heavy periods ?  ? Tegretol [Carbamazepine] Itching  ? Trileptal [Oxcarbazepine] Swelling  ? Adhesive [Tape] Hives, Itching, Rash and Other (See Comments)  ?  PAPER TAPE  ? Augmentin [Amoxicillin-Pot Clavulanate] Rash  ? Gabapentin Rash and Other (See Comments)  ?  Hallucinations and depression, also  ? ? ?Medications Reviewed Today   ? ? Reviewed by Tamara Ellison, RN (Registered Nurse) on 05/02/21 at 69  Med List Status: <None>   ? ?Medication Order Taking? Sig Documenting Provider Last Dose Status Informant  ?acetaminophen (TYLENOL) 650 MG CR tablet [id number] No Take 1,300 mg by mouth 3 (three) times daily as needed for pain. [provider] Taking Active Self  ?         ?Med Note Duffy Bruce, Legrand Como   Thu Jul 10, 2019  4:59 PM) Patient stated she does not "mix" Tylenol with Tramadol  ?ALPRAZolam (XANAX) 1 MG tablet [id number] No Take 1-2 tablets thirty minutes prior to MRI.  May take one additional tablet before entering scanner, if needed.  MUST HAVE DRIVER.  ?Patient not taking: Reported on 01/21/2021  ? Star Age, MD Not Taking Active   ?azithromycin (ZITHROMAX) 250 MG tablet [id number] No Take 1 tablet (250 mg total) by mouth daily. Take first 2 tablets together, then 1 every day until finished.  ?Patient not taking: Reported on 01/21/2021  ? Deno Etienne, DO Not Taking Active   ?baclofen (LIORESAL) 10 MG tablet [id number] No Take 10 mg by mouth 3 (three) times daily as needed for muscle spasms. [provider] Taking Active Self  ?         ?Med Note Broadus John, Trude Mcburney   Fri Jan 21, 2021 10:02 AM) Takes rarely   ?benzonatate (TESSALON) 100 MG capsule [id number] No Take 1 capsule (100 mg total) by mouth every 8 (eight) hours.  ?Patient not taking: Reported on 01/21/2021  ? Deno Etienne, DO Not Taking Active   ?Coenzyme Q10-Fish Oil-Vit E (CO-Q 10 OMEGA-3 FISH OIL PO) [id number] No Take 1 capsule by mouth daily.  [provider] Taking Active Self  ?COLLAGEN PO [id number] No Take 1 Dose by mouth every other day. Powder; alternate with pill form. [provider] Taking Active Self  ?COLLAGEN PO [id number] No Take 3 tablets by mouth every other day. Pill; alternate with powder form. [provider] Taking Active Self  ?Disposable Gloves (NITRILE GLOVES MEDIUM) MISC [id number] No Use prn for bowel incontinence Sater, Nanine Means, MD Taking Active Self  ?         ?Med Note Broadus John, Trude Mcburney   Fri Jan 21, 2021  9:47  AM) 5 M brand gloves  ?docusate sodium (COLACE) 100 MG capsule [id number] No Take 1 capsule (100 mg total) by mouth daily.  ?Patient not taking: Reported on 01/21/2021  ? Cathlyn Parsons, PA-C Not Taking Active   ?fluticasone (FLONASE) 50 MCG/ACT nasal spray [id number] No Place 2 sprays into both nostrils daily.  ?Patient taking differently: Place 2 sprays into both nostrils as needed for allergies.  ? Maudie Mercury, MD Taking Active   ?hyoscyamine (LEVSIN) 0.125 MG tablet [id number] No Take 1 tablet (0.125 mg total) by mouth 3 (three) times daily.  ?Patient taking differently: Take 0.125 mg by mouth as needed for bladder spasms.  ? Sater, Nanine Means, MD Taking Active   ?         ?Med Note Broadus John, Trude Mcburney   Fri Jan 21, 2021  9:43 AM) Patient hasn't taken recent   ?Lacosamide (VIMPAT) 100 MG TABS [id number] No One po bid  ?Patient not taking: Reported on 01/21/2021  ? Sater, Nanine Means, MD Not Taking Active   ?loratadine (CLARITIN) 10 MG tablet [id number] No Take 10 mg by mouth daily as needed for allergies or rhinitis. [provider] Taking Active Self  ?methylPREDNISolone (MEDROL DOSEPAK) 4 MG TBPK tablet [id number] No Take 6 tablets on day 1 and decrease by 1 tablet each day until finished  ?Patient not taking: Reported on 01/21/2021  ? Sater, Nanine Means, MD Not Taking Active Self  ?         ?Med Note (MORA Tsosie Billing A   Tue Sep 07, 2020  2:37 PM) Pt stated she will finish course tomorrow.   ?Multiple Vitamins-Minerals (HAIR/SKIN/NAILS) CAPS [id number] No Take 1 capsule by mouth in the morning, at noon, and at bedtime. [provider] Taking Active Self  ?Multiple Vitamins-Minerals (ONE-A-DAY WOMENS PO) [id number] No Take 1 tablet by mouth daily. [provider] Taking Active Self  ?natalizumab (TYSABRI) 300 MG/15ML injection [id number] No Inject 300 mg into the vein every 28 (twenty-eight) days. [provider] Taking Active Self  ?         ?Med Note Ronnald Ramp, EMMA L   Tue Aug 05, 2019 10:33 AM)  ?Received fax notification that pt is re-auth for Tysabri via touch program 08/05/19-02/25/20. Account: [facility]. Site auth number: [id number]. Pt enrollment number: [id number].  ?pantoprazole (PROTONIX) 40 MG tablet [id number] No Take 1 tablet (40 mg total) by mouth daily. Lucious Groves, DO Taking Active   ?phentermine 37.5 MG capsule [id number]  Take 1 capsule by mouth once daily in the morning Sater, Nanine Means, MD  Active   ?topiramate (TOPAMAX) 50 MG tablet [id number] No TAKE 1 TABLET BY MOUTH THREE TIMES DAILY Sater, Nanine Means, MD Taking Active   ?         ?Med Note Broadus John, Trude Mcburney   Fri Jan 21, 2021  9:56 AM) Dewaine Conger for energy and to  prevent seizures ? ?  ?traMADol (ULTRAM) 50 MG tablet [id number] No TAKE 1 TABLET BY MOUTH TWICE DAILY AS NEEDED FOR SEVERE PAIN  ?Patient taking differently: Take 50 mg by mouth as needed for severe pain.  ? Sater, Nanine Means, MD Taking Active   ?         ?Med Note Broadus John, Trude Mcburney   Fri Jan 21, 2021  9:54 AM) Dewaine Conger rarely and only for severe pain and does not take with Tylenol  ?Vitamin D, Ergocalciferol, (DRISDOL) 1.25 MG (50000 UNIT) CAPS capsule [id number]  Take 1 capsule (50,000 Units total) by mouth every 7 (seven) days. Sater, Nanine Means, MD  Active   ?         ?Med Note Broadus John, Trude Mcburney   Fri Jan 21, 2021  9:55 AM) Dewaine Conger for energy and to prevent seizures  ?zonisamide (ZONEGRAN) 100 MG capsule [id number] No Take 1 capsule (100 mg total) by mouth at bedtime. Sater, Nanine Means, MD Taking Active   ?         ?Med Note Broadus John, Trude Mcburney   Fri Jan 21, 2021  9:58 AM) Patient takes very rarely and only as needed for severe LE spasms because "it causes to be knocked out"   ? ?  ?  ? ?  ? ? ?Patient Active Problem List  ? Diagnosis Date Noted  ? Vitamin D deficiency 06/14/2020  ? B12 deficiency 03/22/2020  ? Left-sided chest wall pain 12/18/2019  ? Spasticity 08/18/2019  ? Sleep apnea 07/18/2019  ? Bilateral hand numbness 01/14/2019  ? RLQ abdominal  pain 10/22/2018  ? Pedal edema 06/04/2018  ? Gait disturbance 06/04/2018  ? Bilateral knee pain 01/07/2018  ? Conjunctivitis 10/30/2017  ? Dental caries 07/30/2017  ? Dyspnea on exertion 07/30/2017  ? Class

## 2021-05-02 NOTE — Patient Instructions (Signed)
Visit Information ? ?Ms. Gregori was given information about Medicaid Managed Care team care coordination services as a part of their [place] Medicaid benefit. Shylo Zamor verbally consented to engagement with the [facility] Managed Care team.  ? ?If you are experiencing a medical emergency, please call 911 or report to your local emergency department or urgent care.  ? ?If you have a non-emergency medical problem during routine business hours, please contact your provider's office and ask to speak with a nurse.  ? ?For questions related to your [facility], please call: [phone] or visit the homepage here: [email or web address] ? ?If you would like to schedule transportation through your [facility], please call the following number at least 2 days in advance of your appointment: [phone]. ? Rides for urgent appointments can also be made after hours by calling Member Services. ? ?Call the [place] at [phone], at any time, 24 hours a day, 7 days a week. If you are in danger or need immediate medical attention call 911. ? ?If you would like help to quit smoking, call 1-800-QUIT-NOW 6691776329) OR Espa?ol: 1-855-D?jelo-Ya [phone]) o para m?s informaci?n haga clic aqu? or Text READY to 200-400 to register via text ? ?Ms. Boule - following are the goals we discussed in your visit today:  ? Goals Addressed   ?Take medications as prescribed   ?Attend all scheduled provider appointments ?Call pharmacy for medication refills 3-7 days in advance of running out of medications ?Perform all self care activities independently  ?Perform IADL's (shopping, preparing meals, housekeeping, managing finances) independently ?Call provider office for new concerns or questions  ?Call health plan member services to discuss benefits details specific to patient's  needs ?Schedule mammogram and colonoscopy  ?Keep appointment with the [facility] to help you with managing your prediabetes and cholesterol ?Review educational information received in mail and write down any questions to discuss on the call with RNCM on 05/17/21 at 10:00 am ? ? ? ?Please see education materials related to prediabetes and cholesterol provided as print materials.  ? ?The patient verbalized understanding of instructions, educational materials, and care plan provided today and agreed to receive a mailed copy of patient instructions, educational materials, and care plan.  ? ?The Managed Medicaid care management team will reach out to the patient again over the next 30 days.  ? ?[name] RN, CCM, CDCES ?[facility] ?Care Management Coordinator - Managed Medicaid High Risk ?[phone]  ? ?Following is a copy of your plan of care:  ?Care Plan : [place]  ?Updates made by Barrington Ellison, RN since 05/02/2021 12:00 AM  ?  ? ?Problem: Knowledge Deficit and Care Coordination Needs Related to Management of Multiple Sclerosis   ?Priority: High  ?  ? ?Long-Range Goal: Development of Plan Of Care to Address Care Coordination Needs and Knowledge Deficits for Management of Multiple Sclerosis, prediabetes and HLD   ?Start Date: 01/21/2021  ?Expected End Date: 01/21/2022  ?Priority: High  ?Note:   ?Current Barriers:  ?Knowledge Deficits related to plan of care for management of Multiple Sclerosis  ?Care Coordination needs related to Lacks knowledge of community resource: dental care - resources received via e-mail per BSW note of 03/10/21 ?Obtained patient's verbal permission for RNCM [name] to join telephone assessment of today 04/18/21 via conference call with patient and this RNCM for training  purposes ?Patient states  as long as she takes her medications for MS and receives her Tysabri infusions her MS is well controlled which  allows her to remain independent in IADLs and ADLs.  ?Patient states her husband , whom she was no longer living with, died on 03-17-2021 and she assisted with arranging his funeral. States family tensions remain with her husband's family.  ?Patient states she was under much more stress when her husband was alive and is declining offer of managed Medicaid LCSW services at this time ?Patient says she saw he primary care provider on 03/31/21 and was told she is overdue for her colonoscopy and mammogram. She says she has not heard results from labs drawn on 03/31/21 ?Patient states she needs an in network provider so she may proceed with colonoscopy ? ?RNCM Clinical Goal(s):  ?Patient will demonstrate ongoing adherence to prescribed treatment plan for multiple sclerosis as evidenced by no inpatient hospitalizations or ED visits due to MS exacerbation or patient reports of MS symptom exacerbation through collaboration with RN Care manager, provider, and care team.  ? ?Interventions: ?Inter-disciplinary care team collaboration (see longitudinal plan of care) ?Evaluation of current treatment plan related to  self management and patient's adherence to plan as established by provider ?Offered services of managed Medicaid LCSW to assist patient with husband's death and stress from  husband's family  ? ? ?Multiple Sclerosis  (Status: Goal on Track (progressing): YES.) Long Term Goal - patient states she remains independent in IADLs and ADLs, has not missed any Tysabri infusions and takes her medications for MS as prescribed ?Evaluation of current treatment plan related to  multiple sclerosis ,     self-management and patient's adherence to plan as established by provider. ?Discussed plans with patient for ongoing care management follow up and provided patient with direct contact information for care management team ?Reviewed medications with patient and discussed importance of adherence to prevent MS exacerbation; ? ? ?Weight Loss  Interventions:  (Status:  New goal. and Condition stable.  Not addressed this visit.) Long Term Goal- patient states her after visit summary of 03/31/21 indicates she is obese; says she is interested in weight loss and preventing diabetes ?Advised patient to discuss with primary care provider options regarding weight management ?Collaboration with provider for dietician referral   ?Advised patient she has been referred to the [facility] for medical nutrition therapy ? ? ?Hyperlipidemia Interventions:  (Status:  New goal.) Long Term Goal- lipid panel of 03/31/21 shows elevated LDL at 123 and total cholesterol at 200- patient states she is shocked to hear of these results and asked for education on how to lower LDL and cholesterol ?Medication review performed; medication list updated in electronic medical record.  ?Provider established cholesterol goals reviewed ?Counseled on importance of regular laboratory monitoring as prescribed ?Provided HLD educational materials ?Reviewed importance of limiting foods high in cholesterol ?Reviewed exercise goals and target of 150 minutes per week  ?Reviewed results of lipid panel done 03/31/21 and discussed targets for LDL and total cholesterol ? ? ?Prediabetes  (Status:  New goal.)  Long Term Goal- patient states "I'm shocked to hear I have prediabetes"  ?Evaluation of current treatment plan related to  prediabetes ,  self-management and patient's adherence to plan as established by provider. ?Discussed plans with patient for ongoing care management follow up and provided patient with direct contact information for care management team ?Advised patient to keep appointment with the [facility] when they contact her to  schedule the appointment  ?Provided education to patient re: prediabetes ?Reviewed Hbg A1C results of 03/31/21 of 5.9% ?Advised patient that she has been referred to the [facility]  [facility] ? ?Health Maintenance Interventions:- patient states her mammogram is scheduled for 05/03/21 ? (Status:  New goal.) Long Term Goal ?Patient interviewed about adult health maintenance status including  Colonoscopy    ?[place]

## 2021-05-03 ENCOUNTER — Ambulatory Visit: Payer: Medicaid Other

## 2021-05-05 ENCOUNTER — Telehealth: Payer: Self-pay

## 2021-05-05 ENCOUNTER — Encounter: Payer: Self-pay | Admitting: Internal Medicine

## 2021-05-05 NOTE — Telephone Encounter (Signed)
Patient called she is requesting a letter for a court appearance, pt is requesting a call back to explain in further detail call [phone] ?

## 2021-05-05 NOTE — Progress Notes (Signed)
Court letter written ?

## 2021-05-09 ENCOUNTER — Other Ambulatory Visit: Payer: Self-pay

## 2021-05-09 NOTE — Patient Instructions (Signed)
Visit Information ? ?Tamara [name] was given information about Medicaid Managed Care team care coordination services as a part of their [place] Medicaid benefit. Tamara [name] verbally consented to engagement with the [facility] Managed Care team.  ? ?If you are experiencing a medical emergency, please call 911 or report to your local emergency department or urgent care.  ? ?If you have a non-emergency medical problem during routine business hours, please contact your provider's office and ask to speak with a nurse.  ? ?For questions related to your [facility], please call: [phone] or visit the homepage here: [email or web address] ? ?If you would like to schedule transportation through your [facility], please call the following number at least 2 days in advance of your appointment: [phone]. ? Rides for urgent appointments can also be made after hours by calling Member Services. ? ?Call the [place] at [phone], at any time, 24 hours a day, 7 days a week. If you are in danger or need immediate medical attention call 911. ? ?If you would like help to quit smoking, call 1-800-QUIT-NOW 470-545-2287) OR Espa?ol: 1-855-D?jelo-Ya [phone]) o para m?s informaci?n haga clic aqu? or Text READY to 200-400 to register via text ? ?Tamara [name] - following are the goals we discussed in your visit today:  ? Goals Addressed   ?None ?  ? ? ? ?Social Worker will follow up in 60 days .  ? ?[name], BSW, [place] ?[place]  ?High Risk Managed Medicaid Team  ?[phone]  ? ?Following is a copy of your plan of care:  ?There are no care plans that you recently modified to display for this patient. ?  ?

## 2021-05-09 NOTE — Patient Outreach (Signed)
?Medicaid Managed Care ?Social Work Note ? ?05/09/2021 ?Name:  Tamara [name] MRN:  [id number] DOB:  [birth year] ? ?Tamara [name] is an 61 y.o. year old female who is [name] primary patient of Lucious Groves, DO.  The [facility] Managed Care Coordination team was consulted for assistance with:  Community Resources  ? ?[name] was given information about Medicaid Managed Care Coordination team services today. Tamara [name] Patient agreed to services and verbal consent obtained. ? ?Engaged with patient  for by telephone forfollow up visit in response to referral for case management and/or care coordination services.  ? ?Assessments/Interventions:  Review of past medical history, allergies, medications, health status, including review of consultants reports, laboratory and other test data, was performed as part of comprehensive evaluation and provision of chronic care management services. ? ?SDOH: (Social Determinant of Health) assessments and interventions performed: ?BSW completed [name] telephone outreach with patient. She stated everything is going well and no resources are needed at this time.  ? ?Advanced Directives Status:  Not addressed in this encounter. ? ?Care Plan ?                ?Allergies  ?Allergen Reactions  ? Codeine Other (See Comments)  ?  Delusions  ? Dilaudid [Hydromorphone Hcl] Swelling and Other (See Comments)  ?  Tongue swells ?  ? Ditropan [Oxybutynin] Other (See Comments)  ?  Burning sensation  ? Other Swelling and Other (See Comments)  ?  Unnamed gel or antiseptic solution- Was applied to IV site with [name] needle- Turned the skin "black and blue" that remained (caused burning and phlebitis, also)  ? Paxil [Paroxetine Hcl] Other (See Comments)  ?  Hallucinations and heavy periods ?  ? Tegretol [Carbamazepine] Itching  ? Trileptal [Oxcarbazepine] Swelling  ? Adhesive [Tape] Hives, Itching, Rash and Other (See Comments)  ?  PAPER TAPE  ? Augmentin [Amoxicillin-Pot Clavulanate] Rash  ? Gabapentin Rash and Other (See  Comments)  ?  Hallucinations and depression, also  ? ? ?Medications Reviewed Today   ? ? Reviewed by Tamara Ellison, RN (Registered Nurse) on 05/02/21 at 60  Med List Status: <None>  ? ?Medication Order Taking? Sig Documenting Provider Last Dose Status Informant  ?acetaminophen (TYLENOL) 650 MG CR tablet [id number] No Take 1,300 mg by mouth 3 (three) times daily as needed for pain. [provider] Taking Active Self  ?         ?Med Note Tamara [name], Tamara [name]   Thu Jul 10, 2019  4:59 PM) Patient stated she does not "mix" Tylenol with Tramadol  ?ALPRAZolam (XANAX) 1 MG tablet [id number] No Take 1-2 tablets thirty minutes prior to MRI.  May take one additional tablet before entering scanner, if needed.  MUST HAVE DRIVER.  ?Patient not taking: Reported on 01/21/2021  ? Star Age, MD Not Taking Active   ?azithromycin (ZITHROMAX) 250 MG tablet [id number] No Take 1 tablet (250 mg total) by mouth daily. Take first 2 tablets together, then 1 every day until finished.  ?Patient not taking: Reported on 01/21/2021  ? Deno Etienne, DO Not Taking Active   ?baclofen (LIORESAL) 10 MG tablet [id number] No Take 10 mg by mouth 3 (three) times daily as needed for muscle spasms. [provider] Taking Active Self  ?         ?Med Note Tamara [name], Trude Mcburney   Fri Jan 21, 2021 10:02 AM) Takes rarely   ?benzonatate (TESSALON) 100 MG capsule [id number] No Take 1 capsule (100 mg total)  by mouth every 8 (eight) hours.  ?Patient not taking: Reported on 01/21/2021  ? Deno Etienne, DO Not Taking Active   ?Coenzyme Q10-Fish Oil-Vit E (CO-Q 10 OMEGA-3 FISH OIL PO) [id number] No Take 1 capsule by mouth daily. [provider] Taking Active Self  ?COLLAGEN PO [id number] No Take 1 Dose by mouth every other day. Powder; alternate with pill form. [provider] Taking Active Self  ?COLLAGEN PO [id number] No Take 3 tablets by mouth every other day. Pill; alternate with powder form. [provider] Taking Active Self   ?Disposable Gloves (NITRILE GLOVES MEDIUM) MISC [id number] No Use prn for bowel incontinence Sater, Nanine Means, MD Taking Active Self  ?         ?Med Note Tamara [name], Trude Mcburney   Fri Jan 21, 2021  9:47 AM) 5 M brand gloves  ?docusate sodium (COLACE) 100 MG capsule [id number] No Take 1 capsule (100 mg total) by mouth daily.  ?Patient not taking: Reported on 01/21/2021  ? Tamara Parsons, PA-C Not Taking Active   ?fluticasone (FLONASE) 50 MCG/ACT nasal spray [id number] No Place 2 sprays into both nostrils daily.  ?Patient taking differently: Place 2 sprays into both nostrils as needed for allergies.  ? Tamara Mercury, MD Taking Active   ?hyoscyamine (LEVSIN) 0.125 MG tablet [id number] No Take 1 tablet (0.125 mg total) by mouth 3 (three) times daily.  ?Patient taking differently: Take 0.125 mg by mouth as needed for bladder spasms.  ? Sater, Nanine Means, MD Taking Active   ?         ?Med Note Tamara [name], Trude Mcburney   Fri Jan 21, 2021  9:43 AM) Patient hasn't taken recent   ?Lacosamide (VIMPAT) 100 MG TABS [id number] No One po bid  ?Patient not taking: Reported on 01/21/2021  ? Sater, Nanine Means, MD Not Taking Active   ?loratadine (CLARITIN) 10 MG tablet [id number] No Take 10 mg by mouth daily as needed for allergies or rhinitis. [provider] Taking Active Self  ?methylPREDNISolone (MEDROL DOSEPAK) 4 MG TBPK tablet [id number] No Take 6 tablets on day 1 and decrease by 1 tablet each day until finished  ?Patient not taking: Reported on 01/21/2021  ? Sater, Nanine Means, MD Not Taking Active Self  ?         ?Med Note (MORA Tamara [name]   Tue Sep 07, 2020  2:37 PM) Pt stated she will finish course tomorrow.   ?Multiple Vitamins-Minerals (HAIR/SKIN/NAILS) CAPS [id number] No Take 1 capsule by mouth in the morning, at noon, and at bedtime. [provider] Taking Active Self  ?Multiple Vitamins-Minerals (ONE-[name]-DAY WOMENS PO) [id number] No Take 1 tablet by mouth daily. [provider] Taking Active Self  ?natalizumab  (TYSABRI) 300 MG/15ML injection [id number] No Inject 300 mg into the vein every 28 (twenty-eight) days. [provider] Taking Active Self  ?         ?Med Note Ronnald Ramp, EMMA L   Tue Aug 05, 2019 10:33 AM)  ?Received fax notification that pt is re-auth for Tysabri via touch program 08/05/19-02/25/20. Account: [place]. Site auth number: [id number]. Pt enrollment number: [id number].  ?pantoprazole (PROTONIX) 40 MG tablet [id number] No Take 1 tablet (40 mg total) by mouth daily. Lucious Groves, DO Taking Active   ?phentermine 37.5 MG capsule [id number]  Take 1 capsule by mouth once daily in the morning Sater, Nanine Means, MD  Active   ?topiramate (TOPAMAX) 50 MG tablet [id number] No TAKE 1 TABLET  BY MOUTH THREE TIMES DAILY Sater, Nanine Means, MD Taking Active   ?         ?Med Note Tamara [name], Trude Mcburney   Fri Jan 21, 2021  9:56 AM) Dewaine Conger for energy and to prevent seizures ? ?  ?traMADol (ULTRAM) 50 MG tablet [id number] No TAKE 1 TABLET BY MOUTH TWICE DAILY AS NEEDED FOR SEVERE PAIN  ?Patient taking differently: Take 50 mg by mouth as needed for severe pain.  ? Sater, Nanine Means, MD Taking Active   ?         ?Med Note Tamara [name], Trude Mcburney   Fri Jan 21, 2021  9:54 AM) Dewaine Conger rarely and only for severe pain and does not take with Tylenol  ?Vitamin D, Ergocalciferol, (DRISDOL) 1.25 MG (50000 UNIT) CAPS capsule [id number]  Take 1 capsule (50,000 Units total) by mouth every 7 (seven) days. Sater, Nanine Means, MD  Active   ?         ?Med Note Tamara [name], Trude Mcburney   Fri Jan 21, 2021  9:55 AM) Dewaine Conger for energy and to prevent seizures  ?zonisamide (ZONEGRAN) 100 MG capsule [id number] No Take 1 capsule (100 mg total) by mouth at bedtime. Sater, Nanine Means, MD Taking Active   ?         ?Med Note Tamara [name], Trude Mcburney   Fri Jan 21, 2021  9:58 AM) Patient takes very rarely and only as needed for severe LE spasms because "it causes to be knocked out"   ? ?  ?  ? ?  ? ? ?Patient Active Problem List  ? Diagnosis Date Noted  ? Vitamin D  deficiency 06/14/2020  ? B12 deficiency 03/22/2020  ? Left-sided chest wall pain 12/18/2019  ? Spasticity 08/18/2019  ? Sleep apnea 07/18/2019  ? Bilateral hand numbness 01/14/2019  ? RLQ abdominal pain 10/22/2018

## 2021-05-10 ENCOUNTER — Ambulatory Visit
Admission: RE | Admit: 2021-05-10 | Discharge: 2021-05-10 | Disposition: A | Discharge status: Home / Self Care | Payer: Medicaid Other | Source: Ambulatory Visit | Attending: Internal Medicine | Admitting: Internal Medicine

## 2021-05-10 ENCOUNTER — Ambulatory Visit: Payer: Medicaid Other

## 2021-05-10 DIAGNOSIS — Z0000 Encounter for general adult medical examination without abnormal findings: Secondary | ICD-10-CM

## 2021-05-10 DIAGNOSIS — Z1231 Encounter for screening mammogram for malignant neoplasm of breast: Secondary | ICD-10-CM | POA: Diagnosis not present

## 2021-05-17 ENCOUNTER — Ambulatory Visit: Payer: Self-pay

## 2021-05-23 ENCOUNTER — Other Ambulatory Visit: Payer: Self-pay | Admitting: *Deleted

## 2021-05-23 NOTE — Patient Outreach (Addendum)
?Medicaid Managed Care   ?Nurse Care Manager Note ? ?05/23/2021 ?Name:  Tamara [name] MRN:  [id number] DOB:  [birth year] ? ?Tamara [name] is an 61 y.o. year old female who is a primary patient of Tamara Groves, DO.  The [facility] Managed Care Coordination team was consulted for assistance with:    ?MS ?HLD ?Prediabetes ?Obesity ? ?Tamara [name] was given information about Medicaid Managed Care Coordination team services today. [place] Patient agreed to services and verbal consent obtained. ? ?Engaged with patient by telephone for follow up visit in response to provider referral for case management and/or care coordination services.  ? ?Assessments/Interventions:  Review of past medical history, allergies, medications, health status, including review of consultants reports, laboratory and other test data, was performed as part of comprehensive evaluation and provision of chronic care management services. ? ?SDOH (Social Determinants of Health) assessments and interventions performed: ? ? ?Care Plan ? ?Allergies  ?Allergen Reactions  ? Codeine Other (See Comments)  ?  Delusions  ? Dilaudid [Hydromorphone Hcl] Swelling and Other (See Comments)  ?  Tongue swells ?  ? Ditropan [Oxybutynin] Other (See Comments)  ?  Burning sensation  ? Other Swelling and Other (See Comments)  ?  Unnamed gel or antiseptic solution- Was applied to IV site with a needle- Turned the skin "black and blue" that remained (caused burning and phlebitis, also)  ? Paxil [Paroxetine Hcl] Other (See Comments)  ?  Hallucinations and heavy periods ?  ? Tegretol [Carbamazepine] Itching  ? Trileptal [Oxcarbazepine] Swelling  ? Adhesive [Tape] Hives, Itching, Rash and Other (See Comments)  ?  PAPER TAPE  ? Augmentin [Amoxicillin-Pot Clavulanate] Rash  ? Gabapentin Rash and Other (See Comments)  ?  Hallucinations and depression, also  ? ? ?Medications Reviewed Today   ? ? Reviewed by Tamara Ellison, RN (Registered Nurse) on 05/23/21 at 1545  Med List  Status: <None>  ? ?Medication Order Taking? Sig Documenting Provider Last Dose Status Informant  ?acetaminophen (TYLENOL) 650 MG CR tablet [id number] No Take 1,300 mg by mouth 3 (three) times daily as needed for pain. [provider] Taking Active Self  ?         ?Med Note Duffy Bruce, Legrand Como   Thu Jul 10, 2019  4:59 PM) Patient stated she does not "mix" Tylenol with Tramadol  ?ALPRAZolam (XANAX) 1 MG tablet [id number] No Take 1-2 tablets thirty minutes prior to MRI.  May take one additional tablet before entering scanner, if needed.  MUST HAVE DRIVER.  ?Patient not taking: Reported on 01/21/2021  ? Tamara Age, MD Not Taking Active   ?azithromycin (ZITHROMAX) 250 MG tablet [id number] No Take 1 tablet (250 mg total) by mouth daily. Take first 2 tablets together, then 1 every day until finished.  ?Patient not taking: Reported on 01/21/2021  ? Tamara Etienne, DO Not Taking Active   ?baclofen (LIORESAL) 10 MG tablet [id number] No Take 10 mg by mouth 3 (three) times daily as needed for muscle spasms. [provider] Taking Active Self  ?         ?Med Note Tamara [name], Tamara [name]   Fri Jan 21, 2021 10:02 AM) Takes rarely   ?benzonatate (TESSALON) 100 MG capsule [id number] No Take 1 capsule (100 mg total) by mouth every 8 (eight) hours.  ?Patient not taking: Reported on 01/21/2021  ? Tamara Etienne, DO Not Taking Active   ?Coenzyme Q10-Fish Oil-Vit E (CO-Q 10 OMEGA-3 FISH OIL PO) [id number] No Take 1  capsule by mouth daily. [provider] Taking Active Self  ?COLLAGEN PO [id number] No Take 1 Dose by mouth every other day. Powder; alternate with pill form. [provider] Taking Active Self  ?COLLAGEN PO [id number] No Take 3 tablets by mouth every other day. Pill; alternate with powder form. [provider] Taking Active Self  ?Disposable Gloves (NITRILE GLOVES MEDIUM) MISC [id number] No Use prn for bowel incontinence [name], Tamara Means, MD Taking Active Self  ?         ?Med Note Tamara [name], Tamara [name]   Fri Jan 21, 2021  9:47 AM) 5 M brand gloves  ?docusate sodium (COLACE) 100 MG capsule [id number] No Take 1 capsule (100 mg total) by mouth daily.  ?Patient not taking: Reported on 01/21/2021  ? Tamara Parsons, PA-C Not Taking Active   ?fluticasone (FLONASE) 50 MCG/ACT nasal spray [id number] No Place 2 sprays into both nostrils daily.  ?Patient taking differently: Place 2 sprays into both nostrils as needed for allergies.  ? Tamara Mercury, MD Taking Active   ?hyoscyamine (LEVSIN) 0.125 MG tablet [id number] No Take 1 tablet (0.125 mg total) by mouth 3 (three) times daily.  ?Patient taking differently: Take 0.125 mg by mouth as needed for bladder spasms.  ? [name], Tamara Means, MD Taking Active   ?         ?Med Note Tamara [name], Tamara [name]   Fri Jan 21, 2021  9:43 AM) Patient hasn't taken recent   ?Lacosamide (VIMPAT) 100 MG TABS [id number] No One po bid  ?Patient not taking: Reported on 01/21/2021  ? [name], Tamara Means, MD Not Taking Active   ?loratadine (CLARITIN) 10 MG tablet [id number] No Take 10 mg by mouth daily as needed for allergies or rhinitis. [provider] Taking Active Self  ?methylPREDNISolone (MEDROL DOSEPAK) 4 MG TBPK tablet [id number] No Take 6 tablets on day 1 and decrease by 1 tablet each day until finished  ?Patient not taking: Reported on 01/21/2021  ? [name], Tamara Means, MD Not Taking Active Self  ?         ?Med Note (MORA Tsosie Billing A   Tue Sep 07, 2020  2:37 PM) Pt stated she will finish course tomorrow.   ?Multiple Vitamins-Minerals (HAIR/SKIN/NAILS) CAPS [id number] No Take 1 capsule by mouth in the morning, at noon, and at bedtime. [provider] Taking Active Self  ?Multiple Vitamins-Minerals (ONE-A-DAY WOMENS PO) [id number] No Take 1 tablet by mouth daily. [provider] Taking Active Self  ?natalizumab (TYSABRI) 300 MG/15ML injection [id number] No Inject 300 mg into the vein every 28 (twenty-eight) days. [provider] Taking Active Self  ?         ?Med Note Ronnald Ramp, EMMA  L   Tue Aug 05, 2019 10:33 AM)  ?Received fax notification that pt is re-auth for Tysabri via touch program 08/05/19-02/25/20. Account: [place]. Site auth number: [id number]. Pt enrollment number: [id number].  ?pantoprazole (PROTONIX) 40 MG tablet [id number] No Take 1 tablet (40 mg total) by mouth daily. Tamara Groves, DO Taking Active   ?phentermine 37.5 MG capsule [id number]  Take 1 capsule by mouth once daily in the morning [name], Tamara Means, MD  Active   ?topiramate (TOPAMAX) 50 MG tablet [id number] No TAKE 1 TABLET BY MOUTH THREE TIMES DAILY [name], Tamara Means, MD Taking Active   ?         ?Med Note Tamara [name], Tamara [name]   Fri Jan 21, 2021  9:56 AM) Dewaine Conger  for energy and to prevent seizures ? ?  ?traMADol (ULTRAM) 50 MG tablet [id number] No TAKE 1 TABLET BY MOUTH TWICE DAILY AS NEEDED FOR SEVERE PAIN  ?Patient taking differently: Take 50 mg by mouth as needed for severe pain.  ? [name], Tamara Means, MD Taking Active   ?         ?Med Note Tamara [name], Tamara [name]   Fri Jan 21, 2021  9:54 AM) Dewaine Conger rarely and only for severe pain and does not take with Tylenol  ?Vitamin D, Ergocalciferol, (DRISDOL) 1.25 MG (50000 UNIT) CAPS capsule [id number]  Take 1 capsule (50,000 Units total) by mouth every 7 (seven) days. [name], Tamara Means, MD  Active   ?         ?Med Note Tamara [name], Tamara [name]   Fri Jan 21, 2021  9:55 AM) Dewaine Conger for energy and to prevent seizures  ?zonisamide (ZONEGRAN) 100 MG capsule [id number] No Take 1 capsule (100 mg total) by mouth at bedtime. [name], Tamara Means, MD Taking Active   ?         ?Med Note Tamara [name], Tamara [name]   Fri Jan 21, 2021  9:58 AM) Patient takes very rarely and only as needed for severe LE spasms because "it causes to be knocked out"   ? ?  ?  ? ?  ? ? ?Patient Active Problem List  ? Diagnosis Date Noted  ? Vitamin D deficiency 06/14/2020  ? B12 deficiency 03/22/2020  ? Left-sided chest wall pain 12/18/2019  ? Spasticity 08/18/2019  ? Sleep apnea 07/18/2019  ? Bilateral hand numbness 01/14/2019  ?  RLQ abdominal pain 10/22/2018  ? Pedal edema 06/04/2018  ? Gait disturbance 06/04/2018  ? Bilateral knee pain 01/07/2018  ? Conjunctivitis 10/30/2017  ? Dental caries 07/30/2017  ? Dyspnea on exertion 07/0

## 2021-05-24 NOTE — Patient Instructions (Addendum)
Visit Information ? ?Ms. Willden was given information about Medicaid Managed Care team care coordination services as a part of their [employer] Medicaid benefit. Lynell Greenhouse Przybysz verbally consented to engagement with the [facility] Managed Care team.  ? ?If you are experiencing a medical emergency, please call 911 or report to your local emergency department or urgent care.  ? ?If you have a non-emergency medical problem during routine business hours, please contact your provider's office and ask to speak with a nurse.  ? ?For questions related to your [facility], please call: [phone] or visit the homepage here: [email or web address] ? ?If you would like to schedule transportation through your [facility], please call the following number at least 2 days in advance of your appointment: [phone]. ? Rides for urgent appointments can also be made after hours by calling Member Services. ? ?Call the [place] at [phone], at any time, 24 hours a day, 7 days a week. If you are in danger or need immediate medical attention call 911. ? ?If you would like help to quit smoking, call 1-800-QUIT-NOW 514-689-1348) OR Espa?ol: 1-855-D?jelo-Ya ([phone]) o para m?s informaci?n haga clic aqu? or Text READY to 200-400 to register via text ? ?Ms. Reifschneider - following are the goals we discussed in your visit today:  ? Goals Addressed   ?Take medications as prescribed   ?Attend all scheduled provider appointments ?Call pharmacy for medication refills 3-7 days in advance of running out of medications ?Perform all self care activities independently  ?Perform IADL's (shopping, preparing meals, housekeeping, managing finances) independently ?Call provider office for new concerns or questions  ?Call health plan member services to discuss benefits details specific to patient's  needs ?Schedule colonoscopy  ? ? ? ?Patient verbalizes understanding of instructions and care plan provided today and agrees to view in [place]. Active MyChart status confirmed with patient.   ? ?The Managed Medicaid care management team will reach out to the patient again over the next 30-45 days.  ? ?[name] RN, CCM, CDCES ?[facility] ?Care Management Coordinator - Managed Medicaid High Risk ?[phone]  ? ?Following is a copy of your plan of care:  ?  ?Care Plan : RN Care Manager Plan Of Care  ?Updates made by Barrington Ellison, RN since 05/23/2021 12:00 AM  ?  ? ?Problem: Knowledge Deficit and Care Coordination Needs Related to Management of Multiple Sclerosis   ?Priority: High  ?  ? ?Long-Range Goal: Development of Plan Of Care to Address Care Coordination Needs and Knowledge Deficits for Management of Multiple Sclerosis, prediabetes and HLD   ?Start Date: 01/21/2021  ?Expected End Date: 01/21/2022  ?Priority: High  ?Note:   ?Current Barriers:  ?Knowledge Deficits related to plan of care for management of Multiple Sclerosis, prediabetes and HLD  ?Care Coordination needs related to Lacks knowledge of community resource: family psychiatric evaluation to assist with securing custody of granddaughter [name]  ? ?Patient states as long as she takes her medications for MS and receives her Tysabri infusions her MS is well controlled which allows her to remain independent in IADLs and ADLs.  ?Patient states family tensions remain with her husband's family as she is contesting her husband's will. He died on 04-03-21.  Patient states she was under much more stress when her husband was alive and is declining offer of managed Medicaid LCSW services at this time ?Patient says she received and reviewed the  educational material that was mailed to her on prediabetes, meal planning and managing cholesterol and does not have any follow up questions. ?Patient says she has not been called to arrange  initial appointment with the Nutrition and Diabetes Educational Services.  ?Says she has not heard from the results of her mammogram done 05/10/21. ?Patient states she received the in network provider list of gastroenterologist that accept Medicaid this RNCM mailed to her but has not called to make the appointment for her 10 year follow up colonoscopy.  ?Says she has a court date on 05/25/21 regarding regaining custody of her granddaughter Maree [place] and would like psychiatric resources that could complete an assessment of patient related to parenting fitness as suggested by her attorney to facilitate regaining custody of her granddaughter.  ? ?RNCM Clinical Goal(s):  ?Patient will demonstrate ongoing adherence to prescribed treatment plan for multiple sclerosis as evidenced by no inpatient hospitalizations or ED visits due to MS exacerbation or patient reports of MS symptom exacerbation through collaboration with RN Care manager, provider, and care team.  ? ?Interventions: ?Inter-disciplinary care team collaboration (see longitudinal plan of care) ?Evaluation of current treatment plan related to  self management and patient's adherence to plan as established by provider ?Advised patient the role of RNCM will transition to [name] with whom patient spoke to on 04/18/21. Follow up telephone appointment arranged for 06/22/21 at 9:00 am.  ? ? ?Interdisciplinary Collaboration Interventions:  (Status: New goal.) Short Term Goal   ?Collaborated with BSW to initiate plan of care to address needs related to Lacks knowledge of community resource: assessment of parental fitness  in patient with HLD and MS, prediabetes and obesity ?Collaboration with Lucious Groves, DO regarding development and update of comprehensive plan of care as evidenced by provider attestation and co-signature ?Inter-disciplinary care team collaboration   ?Arranged telephone appointment with [name] managed Medicaid BSW for 05/27/21 at 9:00 am to  provide resources for assessment of parenting fitness ? ? ?Multiple Sclerosis  (Status: Goal on Track (progressing): YES.) Long Term Goal - patient states she remains independent in IADLs and ADLs, has not missed any Tysabri infusions and takes her medications for MS as prescribed, says she will call this week to make the appointment for her monthly Tysabri infusion ?Evaluation of current treatment plan related to  multiple sclerosis ,     self-management and patient's adherence to plan as established by provider. ?Discussed plans with patient for ongoing care management follow up and provided patient with direct contact information for care management team ?Reviewed medications with patient and discussed importance of adherence to prevent MS exacerbation; ? ? ?Weight Loss Interventions:  (Status:  Goal on track:  NO.) Long Term Goal- patient states her after visit summary of 03/31/21 indicates she is obese; says she is interested in weight loss and preventing diabetes ?Advised patient to discuss with primary care provider options regarding weight management ?Collaboration with provider for dietician referral   ?Advised patient message will be sent to [facility] and Dr Heber [place] regarding follow up on referral to the Nutrition and Diabetes Education Services re: assisting with management of weight, prediabetes and HLD  ? ? ?Hyperlipidemia Interventions:  (Status:  Goal on track:  NO.) Long Term Goal- lipid panel of 03/31/21 shows elevated LDL at 123 and total cholesterol at 200- patient states she received educational information mailed to her and has no follow up questions ?Medication review performed; medication list updated in electronic medical record.  ?Counseled on importance  of regular laboratory monitoring as prescribed ?Ensured patient received educational information on HLD mailed to her  ?Provided opportunity to ask questions about educational material that was mailed to patient's home about controlling  cholesterol and health meal planning ?Advised patient message will be sent to [facility] and Dr Heber [place] regarding follow up on referral to the Nutrition and Diabetes Education Services re: assisting with management of weight

## 2021-05-25 ENCOUNTER — Encounter: Payer: Self-pay | Admitting: Internal Medicine

## 2021-05-25 DIAGNOSIS — R7303 Prediabetes: Secondary | ICD-10-CM | POA: Insufficient documentation

## 2021-05-27 ENCOUNTER — Encounter (HOSPITAL_COMMUNITY): Payer: Medicaid Other

## 2021-05-27 ENCOUNTER — Other Ambulatory Visit: Payer: Self-pay

## 2021-05-27 NOTE — Patient Instructions (Signed)
Visit Information ? ?[name] was given information about Medicaid Managed Care team care coordination services as a part of their [place] Medicaid benefit. Tamara [name] verbally consented to engagement with the [facility] Managed Care team.  ? ?If you are experiencing a medical emergency, please call 911 or report to your local emergency department or urgent care.  ? ?If you have a non-emergency medical problem during routine business hours, please contact your provider's office and ask to speak with a nurse.  ? ?For questions related to your [facility], please call: [phone] or visit the homepage here: [email or web address] ? ?If you would like to schedule transportation through your [facility], please call the following number at least 2 days in advance of your appointment: [phone]. ? Rides for urgent appointments can also be made after hours by calling Member Services. ? ?Call the [place] at [phone], at any time, 24 hours a day, 7 days a week. If you are in danger or need immediate medical attention call 911. ? ?If you would like help to quit smoking, call 1-800-QUIT-NOW [phone]) OR Espa?ol: 1-855-D?jelo-Ya [phone]) o para m?s informaci?n haga clic aqu? or Text READY to 200-400 to register via text ? ?Tamara [name] - following are the goals we discussed in your visit today:  ? Goals Addressed   ?None ?  ? ? ?Social Worker will follow up in 30-60 days .  ? ?Tamara [name], BSW, [place] ?[place]  ?High Risk Managed Medicaid Team  ?[phone]  ? ?Following is a copy of your plan of care:  ?There are no care plans that you recently modified to display for this patient. ?  ?

## 2021-05-27 NOTE — Patient Outreach (Signed)
?Medicaid Managed Care ?Social Work Note ? ?05/27/2021 ?Name:  Tamara [name] MRN:  [id number] DOB:  12-Dec-1960 ? ?Tamara [name] is an 61 y.o. year old female who is [name] primary [name] of Tamara Groves, Tamara [name].  The [facility] Managed Care Coordination team was consulted for assistance with:  Community Resources  ? ?Tamara [name] was given information about Medicaid Managed Care Coordination team services today. Tamara [name] agreed to services and verbal consent obtained. ? ?Engaged with [name]  for by telephone forfollow up visit in response to referral for case management and/or care coordination services.  ? ?Assessments/Interventions:  Review of past medical history, allergies, medications, health status, including review of consultants reports, laboratory and other test data, was performed as part of comprehensive evaluation and provision of chronic care management services. ? ?SDOH: (Social Determinant of Health) assessments and interventions performed: ?BSW completed telephone outreach with [name], she stated she no longer needed the parent fitness evaluation, she and her lawyer decided to go another route. [name] stated she does want to see if her doctor can write [name] order for her to have her colonoscopy done at the hospital. [name] stated she contacted the offices that she was provided and [place] not [place] or [place]. The one in [place] does not accept her insurance. The only GI BSW was able to locate was Dr. Richmond Campbell on [facility]. No other resources needed at this time. ?Advanced Directives Status:  Not addressed in this encounter. ? ?Care Plan ?                ?Allergies  ?Allergen Reactions  ? Codeine Other (See Comments)  ?  Delusions  ? Dilaudid [Hydromorphone Hcl] Swelling and Other (See Comments)  ?  Tongue swells ?  ? Ditropan [Oxybutynin] Other (See Comments)  ?  Burning sensation  ? Other Swelling and Other (See Comments)  ?  Unnamed gel or antiseptic solution- Was  applied to IV site with [name] needle- Turned the skin "black and blue" that remained (caused burning and phlebitis, also)  ? Paxil [Paroxetine Hcl] Other (See Comments)  ?  Hallucinations and heavy periods ?  ? Tegretol [Carbamazepine] Itching  ? Trileptal [Oxcarbazepine] Swelling  ? Adhesive [Tape] Hives, Itching, Rash and Other (See Comments)  ?  PAPER TAPE  ? Augmentin [Amoxicillin-Pot Clavulanate] Rash  ? Gabapentin Rash and Other (See Comments)  ?  Hallucinations and depression, also  ? ? ?Medications Reviewed Today   ? ? Reviewed by Tamara Ellison, RN (Registered Nurse) on 05/23/21 at 1545  Med List Status: <None>  ? ?Medication Order Taking? Sig Documenting Provider Last Dose Status Informant  ?acetaminophen (TYLENOL) 650 MG CR tablet [id number] No Take 1,300 mg by mouth 3 (three) times daily as needed for pain. Provider, Historical, Tamara [name] Taking Active Tamara [name]  ?         ?Med Note Tamara [name]   Thu Jul 10, 2019  4:59 PM) [name] stated she does not "mix" Tylenol with Tramadol  ?ALPRAZolam (XANAX) 1 MG tablet [id number] No Take 1-2 tablets thirty minutes prior to MRI.  May take one additional tablet before entering scanner, if needed.  MUST HAVE DRIVER.  ?[name] not taking: Reported on 01/21/2021  ? Tamara [name] Not Taking Active   ?azithromycin (ZITHROMAX) 250 MG tablet [id number] No Take 1 tablet (250 mg total) by mouth daily. Take first 2 tablets together, then 1 every day until finished.  ?[name]  not taking: Reported on 01/21/2021  ? Tamara Etienne, Tamara [name] Not Taking Active   ?baclofen (LIORESAL) 10 MG tablet [id number] No Take 10 mg by mouth 3 (three) times daily as needed for muscle spasms. Provider, Historical, Tamara [name] Taking Active Tamara [name]  ?         ?Med Note Tamara [name], Tamara [name]   Fri Jan 21, 2021 10:02 AM) Takes rarely   ?benzonatate (TESSALON) 100 MG capsule [id number] No Take 1 capsule (100 mg total) by mouth every 8 (eight) hours.  ?[name] not taking: Reported on 01/21/2021  ? Tamara Etienne, Tamara [name] Not Taking Active    ?Coenzyme Q10-Fish Oil-Vit E (CO-Q 10 OMEGA-3 FISH OIL PO) [id number] No Take 1 capsule by mouth daily. Provider, Historical, Tamara [name] Taking Active Tamara [name]  ?COLLAGEN PO [id number] No Take 1 Dose by mouth every other day. Powder; alternate with pill form. Provider, Historical, Tamara [name] Taking Active Tamara [name]  ?COLLAGEN PO [id number] No Take 3 tablets by mouth every other day. Pill; alternate with powder form. Provider, Historical, Tamara [name] Taking Active Tamara [name]  ?Disposable Gloves (NITRILE GLOVES MEDIUM) MISC [id number] No Use prn for bowel incontinence Tamara [name] Taking Active Tamara [name]  ?         ?Med Note Tamara [name], Tamara [name]   Fri Jan 21, 2021  9:47 AM) 5 M brand gloves  ?docusate sodium (COLACE) 100 MG capsule [id number] No Take 1 capsule (100 mg total) by mouth daily.  ?[name] not taking: Reported on 01/21/2021  ? Tamara Parsons, Tamara [name] Not Taking Active   ?fluticasone (FLONASE) 50 MCG/ACT nasal spray [id number] No Place 2 sprays into both nostrils daily.  ?[name] taking differently: Place 2 sprays into both nostrils as needed for allergies.  ? Tamara Mercury, Tamara [name] Taking Active   ?hyoscyamine (LEVSIN) 0.125 MG tablet [id number] No Take 1 tablet (0.125 mg total) by mouth 3 (three) times daily.  ?[name] taking differently: Take 0.125 mg by mouth as needed for bladder spasms.  ? Tamara [name] Taking Active   ?         ?Med Note Tamara [name], Tamara [name]   Fri Jan 21, 2021  9:43 AM) [name] hasn't taken recent   ?Lacosamide (VIMPAT) 100 MG TABS [id number] No One po bid  ?[name] not taking: Reported on 01/21/2021  ? Tamara [name] Not Taking Active   ?loratadine (CLARITIN) 10 MG tablet [id number] No Take 10 mg by mouth daily as needed for allergies or rhinitis. Provider, Historical, Tamara [name] Taking Active Tamara [name]  ?methylPREDNISolone (MEDROL DOSEPAK) 4 MG TBPK tablet [id number] No Take 6 tablets on day 1 and decrease by 1 tablet each day until finished  ?[name] not taking: Reported on 01/21/2021  ? Tamara [name] Not Taking Active Tamara [name]  ?          ?Med Note (Tamara [name]   Tue Sep 07, 2020  2:37 PM) Pt stated she will finish course tomorrow.   ?Multiple Vitamins-Minerals (HAIR/SKIN/NAILS) CAPS [id number] No Take 1 capsule by mouth in the morning, at noon, and at bedtime. Provider, Historical, Tamara [name] Taking Active Tamara [name]  ?Multiple Vitamins-Minerals (ONE-[name]-DAY WOMENS PO) [id number] No Take 1 tablet by mouth daily. Provider, Historical, Tamara [name] Taking Active Tamara [name]  ?natalizumab (TYSABRI) 300 MG/15ML injection [id number] No Inject 300 mg into the vein every 28 (twenty-eight) days. Provider, Historical, Tamara [name] Taking Active Tamara [name]  ?         ?Med Note Ronnald Ramp, EMMA L   Tue Aug 05, 2019 10:33 AM)  ?Received fax notification that  pt is re-auth for Tysabri via touch program 08/05/19-02/25/20. Account: [place]. Site auth number: [id number]. Pt enrollment number: [id number].  ?pantoprazole (PROTONIX) 40 MG tablet [id number] No Take 1 tablet (40 mg total) by mouth daily. Tamara Groves, Tamara [name] Taking Active   ?phentermine 37.5 MG capsule [id number]  Take 1 capsule by mouth once daily in the morning Tamara [name]  Active   ?topiramate (TOPAMAX) 50 MG tablet [id number] No TAKE 1 TABLET BY MOUTH THREE TIMES DAILY Tamara [name] Taking Active   ?         ?Med Note Tamara [name], Tamara [name]   Fri Jan 21, 2021  9:56 AM) Dewaine Conger for energy and to prevent seizures ? ?  ?traMADol (ULTRAM) 50 MG tablet [id number] No TAKE 1 TABLET BY MOUTH TWICE DAILY AS NEEDED FOR SEVERE PAIN  ?[name] taking differently: Take 50 mg by mouth as needed for severe pain.  ? Tamara [name] Taking Active   ?         ?Med Note Tamara [name], Tamara [name]   Fri Jan 21, 2021  9:54 AM) Dewaine Conger rarely and only for severe pain and does not take with Tylenol  ?Vitamin D, Ergocalciferol, (DRISDOL) 1.25 MG (50000 UNIT) CAPS capsule [id number]  Take 1 capsule (50,000 Units total) by mouth every 7 (seven) days. Tamara [name]  Active   ?         ?Med Note Tamara [name], Tamara [name]   Fri Jan 21, 2021  9:55 AM)  Dewaine Conger for energy and to prevent seizures  ?zonisamide (ZONEGRAN) 100 MG capsule [id number] No Take 1 capsule (100 mg total) by mouth at bedtime. Tamara [name] Taking Active   ?         ?Med Note (HAUSER, JA

## 2021-05-30 ENCOUNTER — Telehealth: Payer: Self-pay | Admitting: *Deleted

## 2021-05-30 ENCOUNTER — Non-Acute Institutional Stay (HOSPITAL_COMMUNITY)
Admission: RE | Admit: 2021-05-30 | Discharge: 2021-05-30 | Disposition: A | Discharge status: Home / Self Care | Payer: Medicaid Other | Source: Ambulatory Visit | Attending: Internal Medicine | Admitting: Internal Medicine

## 2021-05-30 DIAGNOSIS — G35 Multiple sclerosis: Secondary | ICD-10-CM | POA: Diagnosis not present

## 2021-05-30 MED ORDER — SODIUM CHLORIDE 0.9 % IV SOLN
INTRAVENOUS | Status: DC | PRN
Start: 2021-05-30 — End: 2021-05-31

## 2021-05-30 MED ORDER — SODIUM CHLORIDE 0.9 % IV SOLN
300.0000 mg | INTRAVENOUS | Status: DC
  Administered 2021-05-30: 300 mg via INTRAVENOUS
  Filled 2021-05-30: qty 15

## 2021-05-30 NOTE — Telephone Encounter (Signed)
Called and spoke with pt. We got a refill request forfor Vit D 50,000U once per week. However, her last Vit D level from 03/31/21 60.3. Per Dr. Felecia Shelling, she can switch over to Vit D 5000U OTC per day.  ? ?She asked if she can take Vit B1 and minoxidil. Advised per Dr. Felecia Shelling that this would be ok.  ?

## 2021-05-30 NOTE — Progress Notes (Signed)
PATIENT CARE CENTER NOTE ?  ?  ?Diagnosis: Multiple Sclerosis G35 ?  ?  ?Provider: Arlice Colt, MD ?  ?  ?Procedure: Tysabri infusion  ?  ?  ?Note: Patient received Tysabri infusion (dose 7 of 12) via PIV. Tolerated well with no adverse reaction.  Patient declined to wait the 1 hour observation post-infusion.  Vital signs stable. AVS offered but patient refused. Patient alert, oriented and ambulatory at discharge. ?

## 2021-06-01 ENCOUNTER — Telehealth: Payer: Self-pay | Admitting: Neurology

## 2021-06-01 NOTE — Telephone Encounter (Signed)
Rescheduled 5/15 appt with pt over the phone- Dr. Felecia Shelling out. Informed pt to arrive at 2:00 pm. ?

## 2021-06-02 ENCOUNTER — Encounter: Payer: Self-pay | Admitting: Neurology

## 2021-06-02 ENCOUNTER — Telehealth: Payer: Self-pay

## 2021-06-02 ENCOUNTER — Ambulatory Visit (INDEPENDENT_AMBULATORY_CARE_PROVIDER_SITE_OTHER): Payer: Medicaid Other | Admitting: Neurology

## 2021-06-02 VITALS — BP 125/71 | HR 65 | Ht 63.0 in | Wt 185.2 lb

## 2021-06-02 DIAGNOSIS — R269 Unspecified abnormalities of gait and mobility: Secondary | ICD-10-CM

## 2021-06-02 DIAGNOSIS — M62838 Other muscle spasm: Secondary | ICD-10-CM

## 2021-06-02 DIAGNOSIS — Z79899 Other long term (current) drug therapy: Secondary | ICD-10-CM | POA: Diagnosis not present

## 2021-06-02 DIAGNOSIS — G35 Multiple sclerosis: Secondary | ICD-10-CM

## 2021-06-02 DIAGNOSIS — R208 Other disturbances of skin sensation: Secondary | ICD-10-CM | POA: Diagnosis not present

## 2021-06-02 DIAGNOSIS — R200 Anesthesia of skin: Secondary | ICD-10-CM

## 2021-06-02 MED ORDER — LACOSAMIDE 150 MG PO TABS: ORAL_TABLET | ORAL | 5 refills | Status: DC

## 2021-06-02 MED ORDER — TIZANIDINE HCL 4 MG PO TABS: ORAL_TABLET | ORAL | 5 refills | Status: DC

## 2021-06-02 NOTE — Telephone Encounter (Signed)
JCV collected and placed in [facility] box for pickup.  ?

## 2021-06-02 NOTE — Progress Notes (Signed)
? ?[facility] ? ?PATIENT: Tamara [name] ?DOB: Mar 23, 1960 ? ?REFERRING DOCTOR OR PCP:  Toribio Harbour ?SOURCE: Patient, notes from emergency room and recent hospital stay ([facility]), imaging and lab reports, MRI images on PACS. ? ?_________________________________ ? ? ?HISTORICAL ? ?CHIEF COMPLAINT:  ?Chief Complaint  ?Patient presents with  ? Follow-up  ?  RM 16, alone. Last seen 09/09/20. MS DMT: Tysabri. Last infusion 05/31/21 at [facility]. Husband passed 03/05/21.   ? ? ?HISTORY OF PRESENT ILLNESS:  ?Tamara [name] Is a 61 y.o. woman with relapsing remitting multiple sclerosis in May 2018 after she presented with transverse myelitis.  ? ?Update 06/02/2021: ?She is on Tysabri.   She is tolerating the infusions well  JCV Ab 09/09/2020 was 0.17 negative.  She has no exacerbation but does note more cramping and stiffness of her hands.    She takes baclofen and it helps the spasticity but die to sleepiness s.e. she just takes rarely on bad days.   ? ?She is noting a lot of electric like pain in the left arm > leg and numbness in the left arm.  The pain lasts 5 - 15 seconds and she has moderate pain the next 5 minutes that gradually resolves.    These occur 5 tines a day.     She has allodynia in the left arm and hand.  She notes hand symptoms more when she makes a fist or squeezes.   She takes tramadol pain is worse.    ? ?I added lacosamide but she feels it is not helping much.    She could not tolerate gabapentin, Lyrica, lamotrigine and oxcarbazepine and carbamazapine in the past   She had ankle swelling with nortriptyline.  NCV/EMG was fine in the past so sensory symptoms likely from spinal cord plaques.    ? ?She has reduced gait and balance.   ? ?She is on topiramate for headaches with benefit.   She has fatigue.  She still takes phentermine    She has had a vit D deficiency and takes D3.   She has lost 9 pounds ad is trying to eat better.   ?  ? ? ?MS History:   On 06/10/16, she had the onset of  numbness and clumsiness in her legs.   As the day went on, her hands also became numb and clumsy.  She felt numb from her waist down.    She went to the [facility] and had an xtay of her back and was referred to orthopedics     Two days later, she went to the ED when her symptoms worsened and she was seen and discharged.    She saw her internist who ordered a lumbar MRI.  The lumbar MRI showed degenerative changes that were mild at L4-L5 and the study was otherwise fairly normal.   She was referred to the ED and had an MRI of the brain, cervical spine and thoracic spine.    She was found to have a transverse myelitis in the cervical spine with an enhancing lesion in the posterior columns. There was also a smaller non-enhancing focus at C5   She also had 5-6 spots in her brain, the largest being periventricular on the right.  She was admitted to [facility] and had 3 days of IV Solumedrol followed by an oral taper.    She was transferred to rehabilitation and was discharged 5/30.   She walked out and felt much better.  Additionally, while she was in the hospital (06/15/2016) she had a lumbar puncture. The CSF was abnormal showing showing the presence  4 oligoclonal bands and an elevated IgG index of 0.9 (less than 0.7 is normal). The myelin basic protein was mildly elevated at 2.2.      She had another exacerbation after returning home with left leg dragging and feeling more off balance with stumbling.   In retrospect, in 2017, she had an episode lasting a few weeks where she felt she was dragging her left leg some. This completely resolved and she did not have any other symptoms such as numbness at that time.   MRIs of the brain, cervical spine, thoracic spine and lumbar spine were personally reviewed. The MRI of the cervical spine shows an enhancing lesion posterior spinal cord adjacent to C2-C3. Additionally there is a nonenhancing focus adjacent to C5. In the brain, there are several T2/FLAIR  hyperintense foci and the largest is in the right parietal lobe and there are 4 or 5 other small foci, one in the periventricular white matter of the right frontal lobe and the rest in the subcortical white matter.    Laboratory studies show 4 oligoclonal bands and an elevated IgG index of 0.9. Myelin basic protein was mildly elevated at 2.2. She had elevated glucose between 120 and 140 several times while getting steroids.   Her Vit D was mildly low (25.6) and she just started OTC supplementation.   She started Tysabri in July 2018 ? ?IMAGING ? MRIs of the brain and cervical spine 07/16/2019 compared to the 2019 brain and 2018 cervical spine .  There are no new lesions. She has old cervical spine lesions at C3 and C5.  The brain shows multiple chronic foci in the hemispheres.  None in brainstem or cerebellum.  No focus enhances.   ? ?MRI cervical spie 09/24/2020 showed Stable chronic demyelinating plaques at C3 and C5 levels ? ? ?  ?REVIEW OF SYSTEMS: ?Constitutional: No fevers, chills, sweats, or change in appetite ?Eyes: No visual changes, double vision, eye pain ?Ear, nose and throat: No hearing loss, ear pain, nasal congestion, sore throat ?Cardiovascular: No chest pain, palpitations ?Respiratory:  No shortness of breath at rest or with exertion.   No wheezes ?GastrointestinaI: No nausea, vomiting, diarrhea, abdominal pain, fecal incontinence ?Genitourinary:  No dysuria, urinary retention or frequency.  No nocturia. ?Musculoskeletal:  No neck pain, back pain ?Integumentary: No rash, pruritus, skin lesions ?Neurological: as above ?Psychiatric: No depression at this time.  No anxiety ?Endocrine: No palpitations, diaphoresis, change in appetite, change in weigh or increased thirst ?Hematologic/Lymphatic:  No anemia, purpura, petechiae. ?Allergic/Immunologic: No itchy/runny eyes, nasal congestion, recent allergic reactions, rashes ? ?ALLERGIES: ?Allergies  ?Allergen Reactions  ? Codeine Other (See Comments)  ?   Delusions  ? Dilaudid [Hydromorphone Hcl] Swelling and Other (See Comments)  ?  Tongue swells ?  ? Ditropan [Oxybutynin] Other (See Comments)  ?  Burning sensation  ? Other Swelling and Other (See Comments)  ?  Unnamed gel or antiseptic solution- Was applied to IV site with a needle- Turned the skin "black and blue" that remained (caused burning and phlebitis, also)  ? Paxil [Paroxetine Hcl] Other (See Comments)  ?  Hallucinations and heavy periods ?  ? Tegretol [Carbamazepine] Itching  ? Trileptal [Oxcarbazepine] Swelling  ? Adhesive [Tape] Hives, Itching, Rash and Other (See Comments)  ?  PAPER TAPE  ? Augmentin [Amoxicillin-Pot Clavulanate] Rash  ? Gabapentin Rash and Other (See  Comments)  ?  Hallucinations and depression, also  ? ? ?HOME MEDICATIONS: ? ?Current Outpatient Medications:  ?  acetaminophen (TYLENOL) 650 MG CR tablet, Take 1,300 mg by mouth 3 (three) times daily as needed for pain., Disp: , Rfl:  ?  baclofen (LIORESAL) 10 MG tablet, Take 10 mg by mouth 3 (three) times daily as needed for muscle spasms., Disp: , Rfl:  ?  Coenzyme Q10-Fish Oil-Vit E (CO-Q 10 OMEGA-3 FISH OIL PO), Take 1 capsule by mouth daily., Disp: , Rfl:  ?  COLLAGEN PO, Take 1 Dose by mouth every other day. Powder; alternate with pill form., Disp: , Rfl:  ?  COLLAGEN PO, Take 3 tablets by mouth every other day. Pill; alternate with powder form., Disp: , Rfl:  ?  Disposable Gloves (NITRILE GLOVES MEDIUM) MISC, Use prn for bowel incontinence, Disp: 100 each, Rfl: 5 ?  fluticasone (FLONASE) 50 MCG/ACT nasal spray, Place 2 sprays into both nostrils daily. (Patient taking differently: Place 2 sprays into both nostrils as needed for allergies.), Disp: 16 g, Rfl: 2 ?  hyoscyamine (LEVSIN) 0.125 MG tablet, Take 1 tablet (0.125 mg total) by mouth 3 (three) times daily. (Patient taking differently: Take 0.125 mg by mouth as needed for bladder spasms.), Disp: 90 tablet, Rfl: 5 ?  Lacosamide 150 MG TABS, One po bid, Disp: 60 tablet, Rfl: 5 ?   loratadine (CLARITIN) 10 MG tablet, Take 10 mg by mouth daily as needed for allergies or rhinitis., Disp: , Rfl:  ?  MINOXIDIL PO, Take by mouth., Disp: , Rfl:  ?  Multiple Vitamins-Minerals (HAIR/SKIN/NAILS) CAPS,

## 2021-06-03 LAB — CBC WITH DIFFERENTIAL/PLATELET
Basophils Absolute: 0 10*3/uL (ref 0.0–0.2)
Basos: 0 %
EOS (ABSOLUTE): 0 10*3/uL (ref 0.0–0.4)
Eos: 0 %
Hematocrit: 33 % — ABNORMAL LOW (ref 34.0–46.6)
Hemoglobin: 11 g/dL — ABNORMAL LOW (ref 11.1–15.9)
Immature Grans (Abs): 0 10*3/uL (ref 0.0–0.1)
Immature Granulocytes: 0 %
Lymphocytes Absolute: 3.1 10*3/uL (ref 0.7–3.1)
Lymphs: 43 %
MCH: 29.3 pg (ref 26.6–33.0)
MCHC: 33.3 g/dL (ref 31.5–35.7)
MCV: 88 fL (ref 79–97)
Monocytes Absolute: 0.5 10*3/uL (ref 0.1–0.9)
Monocytes: 7 %
NRBC: 1 % — ABNORMAL HIGH (ref 0–0)
Neutrophils Absolute: 3.5 10*3/uL (ref 1.4–7.0)
Neutrophils: 50 %
Platelets: 170 10*3/uL (ref 150–450)
RBC: 3.76 x10E6/uL — ABNORMAL LOW (ref 3.77–5.28)
RDW: 14.5 % (ref 11.7–15.4)
WBC: 7.2 10*3/uL (ref 3.4–10.8)

## 2021-06-04 ENCOUNTER — Other Ambulatory Visit: Payer: Self-pay | Admitting: Neurology

## 2021-06-06 ENCOUNTER — Telehealth: Payer: Self-pay | Admitting: Neurology

## 2021-06-06 ENCOUNTER — Telehealth: Payer: Self-pay | Admitting: Behavioral Health

## 2021-06-06 ENCOUNTER — Telehealth: Payer: Self-pay | Admitting: Internal Medicine

## 2021-06-06 ENCOUNTER — Encounter: Payer: Self-pay | Admitting: *Deleted

## 2021-06-06 NOTE — Telephone Encounter (Signed)
Pt needing a Urgent call back in reference to a letter being written for her deceased spouse Grandchild.  Pt also needing to speak with Dr Theodis Shove urgently .  She needs to talk to some one as soon as possible. ?

## 2021-06-06 NOTE — Telephone Encounter (Signed)
Return pt's call. Stated she wants Dr Heber [place] to call her; she needs a letter pertaining to her deceased husband for her lawyer. ?Also stated she needs a family assessment in order to try to get her granddaughter out of foster care and back with her - I will ask Dr Theodis Shove to assist with this. ?

## 2021-06-06 NOTE — Telephone Encounter (Signed)
mcd [facility] community auth: [id number] (exp. 06/06/21 to 07/21/21) order sent to GI. They will reach out to the patient to schedule.  ?

## 2021-06-06 NOTE — Telephone Encounter (Signed)
Called Tamara [name], she could not talk right now but we made a plan that I would try to call her tomorrow around 1pm. ?

## 2021-06-06 NOTE — Telephone Encounter (Signed)
Pt has a telehealth appt with Dr Theodis Shove Wed @ 1000AM. ?

## 2021-06-06 NOTE — Telephone Encounter (Signed)
Unable to lv msg on RC to Pt per her request. Will cont to try Pt today for f/u. ? ?Dr. Theodis Shove ?

## 2021-06-06 NOTE — Telephone Encounter (Signed)
Last OV was on 06/02/21.  ?Next OV is pending to be scheduled.  ?Last RX was written on 04/25/21 for 30 tabs.  ? ?[place] Drug Database has been reviewed.  ?

## 2021-06-08 ENCOUNTER — Ambulatory Visit: Payer: Medicaid Other | Admitting: Behavioral Health

## 2021-06-08 DIAGNOSIS — F331 Major depressive disorder, recurrent, moderate: Secondary | ICD-10-CM

## 2021-06-08 DIAGNOSIS — F419 Anxiety disorder, unspecified: Secondary | ICD-10-CM

## 2021-06-08 NOTE — BH Specialist Note (Signed)
Integrated Behavioral Health via Telemedicine Visit ? ?06/08/2021 ?Tamara [name] ?[id number] ? ?Number of [place] Clinician visits: 1 ?Session Start time: 1000 ?Session End time: 4696 ?Total time in minutes: 30 min ? ?Referring Provider: Holley Raring, RN; Triage RN msg from 06/07/21 ?Patient/Family location: Pt is @ home in private ?[facility] Provider location: [facility] Office ?All persons participating in visit: Pt & Clinician  ?Types of Service: Individual psychotherapy ? ?I connected with Tamara [name] and/or Tamara [name]'s  self  via  Telephone or Video Enabled Telemedicine Application  (Video is Caregility application) and verified that I am speaking with the correct person using two identifiers. Discussed confidentiality: Yes  ? ?I discussed the limitations of telemedicine and the availability of in person appointments.  Discussed there is a possibility of technology failure and discussed alternative modes of communication if that failure occurs. ? ?I discussed that engaging in this telemedicine visit, they consent to the provision of behavioral healthcare and the services will be billed under their insurance. ? ?Patient and/or legal guardian expressed understanding and consented to Telemedicine visit: Yes  ? ?Presenting Concerns: ?Patient and/or family reports the following symptoms/concerns: elevated anx/dep & distress due to plight of Gdtr's custody ?Duration of problem: yrs of concern & custody; Severity of problem: moderate ? ?Patient and/or Family's Strengths/Protective Factors: ?Social and Emotional competence, Concrete supports in place (healthy food, safe environments, etc.), and Sense of purpose ? ?Goals Addressed: ?Patient will: ? Reduce symptoms of: anxiety, depression, and stress  ? Increase knowledge and/or ability of: coping skills and stress reduction  ? Demonstrate ability to: Increase healthy adjustment to current life circumstances ?Pt given Referral today for Psychological Eval needed  for gaining custody of her Gdtr since the death of her Husb. Pt provided Referral to [facility] Provider Lenise Herald, [place] @ [phone]. ? ?Progress towards Goals: ?Estb 'd today; Pt requesting a Psychological Eval for gaining custody of her Gdtr Tamara [name].  ? ?Interventions: ?Interventions utilized:  Supportive Counseling ?Standardized Assessments completed:  screeners prn ? ?Patient and/or Family Response: Pt receptive & appreciative of call today. Pt is aware of how to make future appt for herself w/Clinician prn.  ? ?Assessment: ?Patient currently experiencing elevated anx/dep & stress over recent events since the death of her Husb. Pt is trying to secure the custody of her Gdtr whom she has raised since age 61yo. ? ?Patient may benefit from awareness of Referral info & availability of Clinician. ? ?Plan: ?Follow up with behavioral health clinician on : None needed today; Pt can call to schedule prn ?Behavioral recommendations: Use Referral info provided ?Referral(s):  [employer] @ [phone]; Lenise Herald, LCSW ? ?I discussed the assessment and treatment plan with the patient and/or parent/guardian. They were provided an opportunity to ask questions and all were answered. They agreed with the plan and demonstrated an understanding of the instructions. ?  ?They were advised to call back or seek an in-person evaluation if the symptoms worsen or if the condition fails to improve as anticipated. ? ?Donnetta Hutching, LMFT ?

## 2021-06-08 NOTE — BH Specialist Note (Deleted)
Integrated Behavioral Health via Telemedicine Visit ? ?06/08/2021 ?Tamara [name] ?[id number] ? ?Number of [place] Clinician visits: 1 ?Session Start time: 1000 ?Session End time: 6834 ?Total time in minutes: 30 min ? ?Referring Provider: Holley Raring, RN (Triage call on Tue, 06/07/21 ?Patient/Family location: Pt is home in private ?[facility] Provider location: [facility] Office ?All persons participating in visit: Pt & Clinician ?Types of Service: Individual psychotherapy ? ?I connected with Tamara [name] and/or Tamara [name]'s  self  via  Telephone or Video Enabled Telemedicine Application  (Video is Caregility application) and verified that I am speaking with the correct person using two identifiers. Discussed confidentiality: Yes  ? ?I discussed the limitations of telemedicine and the availability of in person appointments.  Discussed there is a possibility of technology failure and discussed alternative modes of communication if that failure occurs. ? ?I discussed that engaging in this telemedicine visit, they consent to the provision of behavioral healthcare and the services will be billed under their insurance. ? ?Patient and/or legal guardian expressed understanding and consented to Telemedicine visit: Yes  ? ?Presenting Concerns: ?Patient and/or family reports the following symptoms/concerns: reduction in anx/dep due to success w/obtaining his [employer] Benefits & having cont'd support from his Family & other resources ?Duration of problem: months; Severity of problem: mild - Pt is very resilient ? ?Patient and/or Family's Strengths/Protective Factors: ?Social and Emotional competence, Concrete supports in place (healthy food, safe environments, etc.), Sense of purpose, and Physical Health (exercise, healthy diet, medication compliance, etc.) ? ?Goals Addressed: ?Patient will: ? Reduce symptoms of: anxiety and depression  ? Increase knowledge and/or ability of: coping skills and healthy habits  ? Demonstrate  ability to: Increase healthy adjustment to current life circumstances ? ?Progress towards Goals: ?Ongoing ? ?Interventions: ?Interventions utilized:  Supportive Counseling ?Standardized Assessments completed:  screeners prn ? ?Patient and/or Family Response: Pt receptive to call today & requests future ck-ins ? ?Assessment: ?Patient currently experiencing reduction in anx/dep & concerns for his health since he has started walking daily.  ? ?Patient may benefit from cont'd ck-in calls. ? ?Plan: ?Follow up with behavioral health clinician on : one month out for 30 min telehealth ?Behavioral recommendations: Cont to exercise to stay young! ?Referral(s): [place] (In Clinic) ? ?I discussed the assessment and treatment plan with the patient and/or parent/guardian. They were provided an opportunity to ask questions and all were answered. They agreed with the plan and demonstrated an understanding of the instructions. ?  ?They were advised to call back or seek an in-person evaluation if the symptoms worsen or if the condition fails to improve as anticipated. ? ?Donnetta Hutching, LMFT ?

## 2021-06-08 NOTE — Telephone Encounter (Signed)
JCV collected on 06/02/21 ?Index value: 0.19 ?JCV antibody: negative ?

## 2021-06-09 ENCOUNTER — Telehealth: Payer: Self-pay

## 2021-06-09 NOTE — Telephone Encounter (Signed)
Patient is returning your call she is requesting a call back. ?

## 2021-06-09 NOTE — Telephone Encounter (Signed)
Spoke with Tamara [name], she was requesting a letter about [facility] behavior last year, told her I would have the review the chart,  I also want to make sure there are not HIPAA violation although she notes they were separate but not yet divorced prior to his death. ?

## 2021-06-09 NOTE — Telephone Encounter (Signed)
We spoke on Wed-I did not need a RC. Did she say anything else? Thx Jada- ?

## 2021-06-13 ENCOUNTER — Ambulatory Visit: Payer: Medicaid Other | Admitting: Neurology

## 2021-06-21 ENCOUNTER — Inpatient Hospital Stay: Admission: RE | Admit: 2021-06-21 | Payer: Medicaid Other | Source: Ambulatory Visit

## 2021-06-22 ENCOUNTER — Other Ambulatory Visit: Payer: Self-pay | Admitting: *Deleted

## 2021-06-22 NOTE — Patient Outreach (Signed)
Medicaid Managed Care   Nurse Care Manager Note  06/22/2021 Name:  Tamara [name] MRN:  [id number] DOB:  [birth year]  Tamara [name] is an 61 y.o. year old female who is a primary patient of Tamara Groves, DO.  The [facility] Managed Care Coordination team was consulted for assistance with:    HLD Weight loss Prediabetes Health Maintenance  Tamara [name] was given information about Medicaid Managed Care Coordination team services today. [place] Patient agreed to services and verbal consent obtained.  Engaged with patient by telephone for follow up visit in response to provider referral for case management and/or care coordination services.   Assessments/Interventions:  Review of past medical history, allergies, medications, health status, including review of consultants reports, laboratory and other test data, was performed as part of comprehensive evaluation and provision of chronic care management services.  SDOH (Social Determinants of Health) assessments and interventions performed:   Care Plan  Allergies  Allergen Reactions   Codeine Other (See Comments)    Delusions   Dilaudid [Hydromorphone Hcl] Swelling and Other (See Comments)    Tongue swells    Ditropan [Oxybutynin] Other (See Comments)    Burning sensation   Other Swelling and Other (See Comments)    Unnamed gel or antiseptic solution- Was applied to IV site with a needle- Turned the skin "black and blue" that remained (caused burning and phlebitis, also)   Paxil [Paroxetine Hcl] Other (See Comments)    Hallucinations and heavy periods    Tegretol [Carbamazepine] Itching   Trileptal [Oxcarbazepine] Swelling   Adhesive [Tape] Hives, Itching, Rash and Other (See Comments)    PAPER TAPE   Augmentin [Amoxicillin-Pot Clavulanate] Rash   Gabapentin Rash and Other (See Comments)    Hallucinations and depression, also    Medications Reviewed Today     Reviewed by Tamara Montane, RN (Registered Nurse) on 06/22/21 at  [phone]  Med List Status: <None>   Medication Order Taking? Sig Documenting Provider Last Dose Status Informant  acetaminophen (TYLENOL) 650 MG CR tablet [id number] Yes Take 1,300 mg by mouth 3 (three) times daily as needed for pain. [provider] Taking Active Self           Med Note Duffy Bruce, Para [place] Jul 10, 2019  4:59 PM) Patient stated she does not "mix" Tylenol with Tramadol  baclofen (LIORESAL) 10 MG tablet [id number] Yes Take 10 mg by mouth 3 (three) times daily as needed for muscle spasms. [provider] Taking Active Self           Med Note Broadus John, Trude Mcburney   Fri Jan 21, 2021 10:02 AM) Takes rarely   Coenzyme Q10-Fish Oil-Vit E (CO-Q 10 OMEGA-3 FISH OIL PO) [id number] Yes Take 1 capsule by mouth daily. [provider] Taking Active Self  COLLAGEN PO [id number] Yes Take 1 Dose by mouth every other day. Powder; alternate with pill form. [provider] Taking Active Self  COLLAGEN PO [id number] Yes Take 3 tablets by mouth every other day. Pill; alternate with powder form. [provider] Taking Active Self  Disposable Gloves (NITRILE GLOVES MEDIUM) [place] [id number]  Use prn for bowel incontinence Sater, Nanine Means, MD  Active Self           Med Note Broadus John, Trude Mcburney   Fri Jan 21, 2021  9:47 AM) 5 M brand gloves  fluticasone (FLONASE) 50 MCG/ACT nasal spray [id number] Yes Place 2 sprays into both nostrils daily.  Patient  taking differently: Place 2 sprays into both nostrils as needed for allergies.   Maudie Mercury, MD Taking Active   hyoscyamine (LEVSIN) 0.125 MG tablet [id number] No Take 1 tablet (0.125 mg total) by mouth 3 (three) times daily.  Patient not taking: Reported on 06/22/2021   Britt Bottom, MD Not Taking Active            Med Note Broadus John, Trude Mcburney   Fri Jan 21, 2021  9:43 AM) Patient hasn't taken recent   Lacosamide 150 MG TABS [id number] Yes One po bid Britt Bottom, MD Taking Active   Lactobacillus (ACIDOPHILUS/PECTIN PO)  [id number] Yes Take by mouth. [provider] Taking Active   loratadine (CLARITIN) 10 MG tablet [id number] Yes Take 10 mg by mouth daily as needed for allergies or rhinitis. [provider] Taking Active Self  MINOXIDIL PO [id number] No Take by mouth.  Patient not taking: Reported on 06/22/2021   [provider] Not Taking Active   Multiple Vitamins-Minerals (HAIR/SKIN/NAILS) CAPS [id number] Yes Take 1 capsule by mouth in the morning, at noon, and at bedtime. [provider] Taking Active Self  Multiple Vitamins-Minerals (ONE-A-DAY WOMENS PO) [id number] Yes Take 1 tablet by mouth daily. [provider] Taking Active Self  natalizumab (TYSABRI) 300 MG/15ML injection [id number] Yes Inject 300 mg into the vein every 28 (twenty-eight) days. [provider] Taking Active Self           Med Note Ronnald Ramp, EMMA L   Tue Aug 05, 2019 10:33 AM)  Received fax notification that pt is re-auth for Tysabri via touch program 08/05/19-02/25/20. Account: [place]. Site auth number: [id number]. Pt enrollment number: [id number].  pantoprazole (PROTONIX) 40 MG tablet [id number] Yes Take 1 tablet (40 mg total) by mouth daily. Tamara Groves, DO Taking Active   phentermine 37.5 MG capsule [id number] Yes Take 1 capsule by mouth once daily in the morning Dohmeier, Asencion Partridge, MD Taking Active   Thiamine HCl (VITAMIN B-1 PO) [id number] Yes Take by mouth. [provider] Taking Active   tiZANidine (ZANAFLEX) 4 MG tablet [id number] Yes 1/2 to 1 pill po tid Britt Bottom, MD Taking Active   topiramate (TOPAMAX) 50 MG tablet [id number] Yes TAKE 1 TABLET BY MOUTH THREE TIMES DAILY Sater, Nanine Means, MD Taking Active            Med Note Broadus John, Trude Mcburney   Fri Jan 21, 2021  9:56 AM) Dewaine Conger for energy and to prevent seizures    traMADol (ULTRAM) 50 MG tablet [id number] No TAKE 1 TABLET BY MOUTH TWICE DAILY AS NEEDED FOR SEVERE PAIN  Patient not taking: Reported  on 06/22/2021   Britt Bottom, MD Not Taking Active            Med Note Broadus John, Trude Mcburney   Fri Jan 21, 2021  9:54 AM) Dewaine Conger rarely and only for severe pain and does not take with Tylenol  VITAMIN D PO [id number] No Take 5,000 Units by mouth daily.  Patient not taking: Reported on 06/22/2021   [provider] Not Taking Active             Patient Active Problem List   Diagnosis Date Noted   Prediabetes 05/25/2021   Vitamin D deficiency 06/14/2020   B12 deficiency 03/22/2020   Left-sided chest wall pain 12/18/2019   Spasticity 08/18/2019   Sleep apnea 07/18/2019   Bilateral hand numbness 01/14/2019   RLQ abdominal pain 10/22/2018   Pedal  edema 06/04/2018   Gait disturbance 06/04/2018   Bilateral knee pain 01/07/2018   Conjunctivitis 10/30/2017   Dental caries 07/30/2017   Dyspnea on exertion 07/30/2017   Class 1 obesity with body mass index (BMI) of 32.0 to 32.9 in adult 12/18/2016   Dysesthesia 10/11/2016   High risk medication use 07/04/2016   Multiple sclerosis ([place]) 06/19/2016   Neurogenic bowel    Neurogenic bladder    Allergic rhinitis 04/12/2016   Sciatica associated with disorder of lumbosacral spine 01/15/2014   GERD (gastroesophageal reflux disease) 07/21/2013   RBBB on EKG 08/01/2012   IBS (irritable bowel syndrome) 06/19/2012   Healthcare maintenance 06/19/2012    Conditions to be addressed/monitored per PCP order:  HLD and weight loss, HLD and health maintenance  Care Plan : [place]  Updates made by Tamara Montane, RN since 06/22/2021 12:00 AM     Problem: Knowledge Deficit and Care Coordination Needs Related to Management of Multiple Sclerosis   Priority: High     Long-Range Goal: Development of Plan Of Care to Address Care Coordination Needs and Knowledge Deficits for Management of Multiple Sclerosis, prediabetes and HLD   Start Date: 01/21/2021  Expected End Date: 01/21/2022  Priority: High  Note:   Current  Barriers:  Knowledge Deficits related to plan of care for management of Multiple Sclerosis, prediabetes and HLD  Care Coordination needs related to Lacks knowledge of community resource: family psychiatric evaluation to assist with securing custody of granddaughter [name]   Patient states as long as she takes her medications for MS and receives her Tysabri infusions her MS is well controlled which allows her to remain independent in IADLs and ADLs.  Patient states family tensions remain with her husband's family as she is contesting her husband's will. He died on Mar 31, 2021.  Patient states she was under much more stress when her husband was alive and is declining offer of managed Medicaid LCSW services at this time Patient says she received and reviewed the educational material that was mailed to her on prediabetes, meal planning and managing cholesterol and does not have any follow up questions. Patient says she has not been called to arrange initial appointment with the [facility].  Says she has not heard from the results of her mammogram done 05/10/21. Patient states she received the in network provider list of gastroenterologist that accept Medicaid this RNCM mailed to her but has not called to make the appointment for her 10 year follow up colonoscopy.  Says she has a court date on 05/25/21 regarding regaining custody of her granddaughter Maree E[place] and would like psychiatric resources that could complete an assessment of patient related to parenting fitness as suggested by her attorney to facilitate regaining custody of her granddaughter.   RNCM Clinical Goal(s):  Patient will demonstrate ongoing adherence to prescribed treatment plan for multiple sclerosis as evidenced by no inpatient hospitalizations or ED visits due to MS exacerbation or patient reports of MS symptom exacerbation through collaboration with RN Care manager, provider, and care team.    Interventions: Inter-disciplinary care team collaboration (see longitudinal plan of care) Evaluation of current treatment plan related to  self management and patient's adherence to plan as established by provider Report received from Marcie Bal, chart reviewed     Interdisciplinary Collaboration Interventions:  (Status: Goal on track:  Yes.) Short Term Goal   Collaborated with BSW to initiate plan of care to address needs related to [place] knowledge of community  resource: assessment of parental fitness  in patient with HLD and MS, prediabetes and obesity Collaboration with Tamara Groves, DO regarding development and update of comprehensive plan of care as evidenced by provider attestation and co-signature Inter-disciplinary care team collaboration   Arranged telephone appointment with [name] managed Medicaid BSW for 05/27/21 at 9:00 am to provide resources for assessment of parenting fitness-Patient attempting to schedule appointment for psychological evaluation, waiting for scheduling-needs provider that she has not had a professional relationship   Multiple Sclerosis  (Status: Goal Met.) Long Term Goal - patient states she remains independent in IADLs and ADLs, has not missed any Tysabri infusions and takes her medications for MS as prescribed, says she will call this week to make the appointment for her monthly Tysabri infusion Evaluation of current treatment plan related to  multiple sclerosis ,     self-management and patient's adherence to plan as established by provider. Discussed plans with patient for ongoing care management follow up and provided patient with direct contact information for care management team Reviewed medications with patient and discussed importance of adherence to prevent MS exacerbation;   Weight Loss Interventions:  (Status:  Goal on track:  NO.) Long Term Goal- patient states her after visit summary of 03/31/21 indicates she is obese; says she is interested  in weight loss and preventing diabetes Advised patient to discuss with primary care provider options regarding weight management Collaboration with provider for dietician referral   Advised patient message will be sent to [facility] and Dr Heber [place] regarding follow up on referral written 04/26/21 to the [facility] re: assisting with management of weight, prediabetes and HLD -Patient declined visit as she is unable to pay $99 for the consult Advised patient to contact [facility] re: enhanced member benefits(wellness benefits, fresh fruit and vegetables)   Hyperlipidemia Interventions:  (Status:  Goal on track:  NO.) Long Term Goal- lipid panel of 03/31/21 shows elevated LDL at 123 and total cholesterol at 200- patient states she received educational information mailed to her and has no follow up questions Medication review performed; medication list updated in electronic medical record.  Counseled on importance of regular laboratory monitoring as prescribed Ensured patient received educational information on HLD mailed to her  Provided opportunity to ask questions about educational material that was mailed to patient's home about controlling cholesterol and health meal planning Advised patient message will be sent to [facility] and Dr Heber [place] regarding follow up on referral written 04/26/21 to the [facility] re: assisting with management of weight, prediabetes and HLD    Prediabetes  (Status:  Goal on track:  NO.)  Long Term Goal- patient states "I have not been contacted" by the [facility] regarding referral written 04/28/21 Evaluation of current treatment plan related to  prediabetes ,  self-management and patient's adherence to plan as established by provider. Discussed plans with patient for ongoing care management follow up and provided patient with direct contact information for care management team Assessed  status of the referral to the [facility]  Advised patient message will be sent to [facility] and Dr Heber  regarding follow up on referral written 04/26/21 to the [facility] re: assisting with management of weight, prediabetes and HLD -Patient declined visit as she is unable to pay $99 for the consult  Health Maintenance Interventions:( Goal on track: yes) Long Term Goal  (Status:  New goal.) Long  Term Goal Patient interviewed about adult health maintenance status including  Colonoscopy    Regular Dental Care    Discussed referral to GI-[facility] unable to accept patient's insurance Advised patient to discuss needing a new referral to GI  Discussed upcoming [facility] appointment with [facility] on 07/06/21 Advised to schedule Dermatology appointment   Patient Goals/Self-Care Activities: Take medications as prescribed   Attend all scheduled provider appointments Call pharmacy for medication refills 3-7 days in advance of running out of medications Perform all self care activities independently  Perform IADL's (shopping, preparing meals, housekeeping, managing finances) independently Call provider office for new concerns or questions  Call health plan member services to discuss benefits details specific to patient's needs Schedule colonoscopy         Follow Up:  Patient agrees to Care Plan and Follow-up.  Plan: The Managed Medicaid care management team will reach out to the patient again over the next 30 days.  Date/time of next scheduled RN care management/care coordination outreach:  07/25/21 @ 10:30pm  [name] RN, [place] RN Care Coordinator

## 2021-06-22 NOTE — Patient Instructions (Signed)
Visit Information  Tamara [name] was given information about Medicaid Managed Care team care coordination services as a part of their [place] Medicaid benefit. Tamara [name] verbally consented to engagement with the [facility] Managed Care team.   If you are experiencing a medical emergency, please call 911 or report to your local emergency department or urgent care.   If you have a non-emergency medical problem during routine business hours, please contact your provider's office and ask to speak with a nurse.   For questions related to your [facility], please call: [phone] or visit the homepage here: [email or web address]  If you would like to schedule transportation through your [facility], please call the following number at least 2 days in advance of your appointment: [phone].  Rides for urgent appointments can also be made after hours by calling Member Services.  Call the [place] at [phone], at any time, 24 hours a day, 7 days a week. If you are in danger or need immediate medical attention call 911.  If you would like help to quit smoking, call 1-800-QUIT-NOW (718)877-3344) OR Espaol: 1-855-Djelo-Ya (7-622-633-3545) o para ms informacin haga clic aqu or Text READY to 200-400 to register via text  Tamara [name],   Please see education materials related to prediabetes provided by MyChart link. and as Ad[employer] account planner.   The patient verbalized understanding of instructions, educational materials, and care plan provided today and agreed to receive a mailed copy of patient instructions, educational materials, and care plan.   Telephone follow up appointment with Managed Medicaid care management team member scheduled for:07/25/21 @ 10:30am  Tamara Joiner RN, BSN [place] RN Care  Coordinator   Following is a copy of your plan of care:  Care Plan : [place]  Updates made by Melissa Montane, RN since 06/22/2021 12:00 AM     Problem: Knowledge Deficit and Care Coordination Needs Related to Management of Multiple Sclerosis   Priority: High     Long-Range Goal: Development of Plan Of Care to Address Care Coordination Needs and Knowledge Deficits for Management of Multiple Sclerosis, prediabetes and HLD   Start Date: 01/21/2021  Expected End Date: 01/21/2022  Priority: High  Note:   Current Barriers:  Knowledge Deficits related to plan of care for management of Multiple Sclerosis, prediabetes and HLD  Care Coordination needs related to Lacks knowledge of community resource: family psychiatric evaluation to assist with securing custody of granddaughter [name]   Patient states as long as she takes her medications for MS and receives her Tysabri infusions her MS is well controlled which allows her to remain independent in IADLs and ADLs.  Patient states family tensions remain with her husband's family as she is contesting her husband's will. He died on March 24, 2021.  Patient states she was under much more stress when her husband was alive and is declining offer of managed Medicaid LCSW services at this time Patient says she received and reviewed the educational material that was mailed to her on prediabetes, meal planning and managing cholesterol and does not have any follow up questions. Patient says she has not been called to arrange initial appointment with the [facility].  Says she has not heard from the results of her mammogram done 05/10/21. Patient states she received the in network provider list of gastroenterologist that accept Medicaid this RNCM mailed to her but has  not called to make the appointment for her 10 year follow up colonoscopy.  Says she has a court date on 05/25/21 regarding regaining custody of her  granddaughter Maree Erie and would like psychiatric resources that could complete an assessment of patient related to parenting fitness as suggested by her attorney to facilitate regaining custody of her granddaughter.   RNCM Clinical Goal(s):  Patient will demonstrate ongoing adherence to prescribed treatment plan for multiple sclerosis as evidenced by no inpatient hospitalizations or ED visits due to MS exacerbation or patient reports of MS symptom exacerbation through collaboration with RN Care manager, provider, and care team.   Interventions: Inter-disciplinary care team collaboration (see longitudinal plan of care) Evaluation of current treatment plan related to  self management and patient's adherence to plan as established by provider Report received from Marcie Bal, chart reviewed     Interdisciplinary Collaboration Interventions:  (Status: Goal on track:  Yes.) Short Term Goal   Collaborated with BSW to initiate plan of care to address needs related to Lacks knowledge of community resource: assessment of parental fitness  in patient with HLD and MS, prediabetes and obesity Collaboration with Lucious Groves, DO regarding development and update of comprehensive plan of care as evidenced by provider attestation and co-signature Inter-disciplinary care team collaboration   Arranged telephone appointment with [name] managed Medicaid BSW for 05/27/21 at 9:00 am to provide resources for assessment of parenting fitness-Patient attempting to schedule appointment for psychological evaluation, waiting for scheduling-needs provider that she has not had a professional relationship   Multiple Sclerosis  (Status: Goal Met.) Long Term Goal - patient states she remains independent in IADLs and ADLs, has not missed any Tysabri infusions and takes her medications for MS as prescribed, says she will call this week to make the appointment for her monthly Tysabri infusion Evaluation of current treatment plan  related to  multiple sclerosis ,     self-management and patient's adherence to plan as established by provider. Discussed plans with patient for ongoing care management follow up and provided patient with direct contact information for care management team Reviewed medications with patient and discussed importance of adherence to prevent MS exacerbation;   Weight Loss Interventions:  (Status:  Goal on track:  NO.) Long Term Goal- patient states her after visit summary of 03/31/21 indicates she is obese; says she is interested in weight loss and preventing diabetes Advised patient to discuss with primary care provider options regarding weight management Collaboration with provider for dietician referral   Advised patient message will be sent to [facility] and Dr Heber  regarding follow up on referral written 04/26/21 to the [facility] Education Services re: assisting with management of weight, prediabetes and HLD -Patient declined visit as she is unable to pay $99 for the consult Advised patient to contact [facility] re: enhanced member benefits(wellness benefits, fresh fruit and vegetables)   Hyperlipidemia Interventions:  (Status:  Goal on track:  NO.) Long Term Goal- lipid panel of 03/31/21 shows elevated LDL at 123 and total cholesterol at 200- patient states she received educational information mailed to her and has no follow up questions Medication review performed; medication list updated in electronic medical record.  Counseled on importance of regular laboratory monitoring as prescribed Ensured patient received educational information on HLD mailed to her  Provided opportunity to ask questions about educational material that was mailed to patient's home about controlling cholesterol and health meal planning Advised patient message will be sent to [facility] and Dr  Hoffman regarding follow up on referral written 04/26/21 to the [facility] Services re: assisting with  management of weight, prediabetes and HLD    Prediabetes  (Status:  Goal on track:  NO.)  Long Term Goal- patient states "I have not been contacted" by the [facility] regarding referral written 04/28/21 Evaluation of current treatment plan related to  prediabetes ,  self-management and patient's adherence to plan as established by provider. Discussed plans with patient for ongoing care management follow up and provided patient with direct contact information for care management team Assessed status of the referral to the [facility]  Advised patient message will be sent to [facility] and Dr Heber Gray regarding follow up on referral written 04/26/21 to the [facility] re: assisting with management of weight, prediabetes and HLD -Patient declined visit as she is unable to pay $99 for the consult  Health Maintenance Interventions:( Goal on track: yes) Long Term Goal  (Status:  New goal.) Long Term Goal Patient interviewed about adult health maintenance status including  Colonoscopy    Regular Dental Care    Discussed referral to GI-[facility] unable to accept patient's insurance Advised patient to discuss needing a new referral to GI  Discussed upcoming [facility] appointment with [facility] on 07/06/21 Advised to schedule Dermatology appointment   Patient Goals/Self-Care Activities: Take medications as prescribed   Attend all scheduled provider appointments Call pharmacy for medication refills 3-7 days in advance of running out of medications Perform all self care activities independently  Perform IADL's (shopping, preparing meals, housekeeping, managing finances) independently Call provider office for new concerns or questions  Call health plan member services to discuss benefits details specific to patient's needs Schedule colonoscopy

## 2021-06-30 ENCOUNTER — Non-Acute Institutional Stay (HOSPITAL_COMMUNITY)
Admission: RE | Admit: 2021-06-30 | Discharge: 2021-06-30 | Disposition: A | Discharge status: Home / Self Care | Payer: Medicaid Other | Source: Ambulatory Visit | Attending: Internal Medicine | Admitting: Internal Medicine

## 2021-06-30 DIAGNOSIS — G35 Multiple sclerosis: Secondary | ICD-10-CM | POA: Diagnosis present

## 2021-06-30 MED ORDER — SODIUM CHLORIDE 0.9 % IV SOLN
INTRAVENOUS | Status: DC | PRN
Start: 2021-06-30 — End: 2021-07-01

## 2021-06-30 MED ORDER — SODIUM CHLORIDE 0.9 % IV SOLN
300.0000 mg | INTRAVENOUS | Status: DC
  Administered 2021-06-30: 300 mg via INTRAVENOUS
  Filled 2021-06-30: qty 15

## 2021-06-30 NOTE — Progress Notes (Signed)
PATIENT CARE CENTER NOTE     Diagnosis: Multiple Sclerosis G35     Provider: Arlice Colt, MD     Procedure: Tysabri infusion      Note: Patient received Tysabri infusion (dose 8 of 12) via PIV. Tolerated well with no adverse reaction.  Patient declined to wait the 1 hour observation post-infusion.  Vital signs stable. AVS offered but patient refused. Patient will schedule next appointment at the front desk. Alert, oriented and ambulatory at discharge.

## 2021-07-04 ENCOUNTER — Telehealth: Payer: Self-pay | Admitting: *Deleted

## 2021-07-04 ENCOUNTER — Other Ambulatory Visit (HOSPITAL_COMMUNITY): Payer: Self-pay | Admitting: *Deleted

## 2021-07-04 NOTE — Patient Outreach (Signed)
Care Coordination  07/04/2021  Tamara [name] 08/24/1960 [id number]  Patient left voice message for this RNCM on 07/01/21 at 4;44 pm requesting referral to dermatologist [name], office address [address]. [place]. Successful outreach to patient via phone to advise her this RNCM will message Dr Heber Railroad, patient's PCP and [name], [facility] Referral coordinator, regarding request for referral to Dr Daine Floras. Also ensured patient has name and contact number of Managed Medicaid RNCM [name]. Notified Managed Medicaid [name] since patient states she emailed [name] with same request regarding dermatology referral.  [name] RN, CCM, [place] Management Coordinator [phone]

## 2021-07-07 ENCOUNTER — Other Ambulatory Visit: Payer: Self-pay

## 2021-07-07 ENCOUNTER — Other Ambulatory Visit: Payer: Self-pay | Admitting: *Deleted

## 2021-07-07 MED ORDER — PANTOPRAZOLE SODIUM 40 MG PO TBEC: 40.0000 mg | DELAYED_RELEASE_TABLET | Freq: Every day | ORAL | 1 refills | Status: DC

## 2021-07-07 NOTE — Telephone Encounter (Signed)
Next appt scheduled 6/15 with PCP.

## 2021-07-07 NOTE — Patient Outreach (Signed)
Care Coordination  07/07/2021  Tamara [name] Apr 05, 1960 [id number]   Medicaid Managed Care   Unsuccessful Outreach Note  07/07/2021 Name: Tamara [name] MRN: [id number] DOB: February 07, 1960  Referred by: Lucious Groves, DO Reason for referral : High Risk Managed Medicaid (MM social work telephone outreach)   An unsuccessful telephone outreach was attempted today. The patient was referred to the case management team for assistance with care management and care coordination.   Follow Up Plan: The patient has been provided with contact information for the care management team and has been advised to call with any health related questions or concerns.   [name], BSW, [place] Managed Medicaid Team  [phone]

## 2021-07-07 NOTE — Patient Instructions (Signed)
Visit Information  [name]  - as a part of your Medicaid benefit, you are eligible for care management and care coordination services at no cost or copay. I was unable to reach you by phone today but would be happy to help you with your health related needs. Please feel free to call me @ [phone].     [name], BSW, [place] Managed Medicaid Team  [phone]

## 2021-07-10 ENCOUNTER — Inpatient Hospital Stay: Admission: RE | Admit: 2021-07-10 | Payer: Medicaid Other | Source: Ambulatory Visit

## 2021-07-14 ENCOUNTER — Encounter: Payer: Medicaid Other | Admitting: Internal Medicine

## 2021-07-19 ENCOUNTER — Telehealth: Payer: Self-pay | Admitting: *Deleted

## 2021-07-19 NOTE — Patient Outreach (Signed)
Care Coordination  07/19/2021  ROISE EMERT 1960/09/13 [id number]  Reached patient by phone to advise her, per clinic referral coordinator [name], she will need to discuss referral to dermatologist with Dr. Heber Palmer during her office visit on 7/6 since visit notes must be sent with referral. Ms Mcginness voice understanding and compliance.  [name] RN, CCM, [place] Management Coordinator  [phone]

## 2021-07-25 ENCOUNTER — Other Ambulatory Visit: Payer: Self-pay | Admitting: *Deleted

## 2021-07-25 NOTE — Patient Outreach (Signed)
Care Coordination  07/25/2021  OVIDA MATHE 1960-06-29 [id number]   Medicaid Managed Care   Unsuccessful Outreach Note  07/25/2021 Name: Tamara [name] MRN: [id number] DOB: Sep 20, 1960  Referred by: [name], DO Reason for referral : High Risk Managed Medicaid (Unsuccessful RNCM follow up telephone outreach)   An unsuccessful telephone outreach was attempted today. The patient was referred to the case management team for assistance with care management and care coordination.   Follow Up Plan: The care management team will reach out to the patient again over the next 14 days.   [name] RN, BSN [place]  [employer] Economist

## 2021-07-27 ENCOUNTER — Other Ambulatory Visit: Payer: Self-pay | Admitting: *Deleted

## 2021-07-27 NOTE — Patient Instructions (Signed)
Visit Information  Ms. [place]uth was given information about Medicaid Managed Care team care coordination services as a part of their [place] Medicaid benefit. Glorimar Stroope Esquer verbally consented to engagement with the [facility] Managed Care team.   If you are experiencing a medical emergency, please call 911 or report to your local emergency department or urgent care.   If you have a non-emergency medical problem during routine business hours, please contact your provider's office and ask to speak with a nurse.   For questions related to your [facility], please call: [phone] or visit the homepage here: [email or web address]  If you would like to schedule transportation through your [facility], please call the following number at least 2 days in advance of your appointment: [phone]   Rides for urgent appointments can also be made after hours by calling Member Services.  Call the [place] at [phone], at any time, 24 hours a day, 7 days a week. If you are in danger or need immediate medical attention call 911.  If you would like help to quit smoking, call 1-800-QUIT-NOW 484-840-9549) OR Espaol: 1-855-Djelo-Ya (3-299-242-6834) o para ms informacin haga clic aqu or Text READY to 200-400 to register via text  Ms. Braaksma,   Please see education materials related to diet and exercise provided as print materials.   The patient verbalized understanding of instructions, educational materials, and care plan provided today and agreed to receive a mailed copy of patient instructions, educational materials, and care plan.   Telephone follow up appointment with Managed Medicaid care management team member scheduled for:08/26/21 @ 10:30am  [name] RN, BSN [place] RN Care Coordinator   Following is a copy  of your plan of care:  Care Plan : [place]  Updates made by Melissa Montane, RN since 07/27/2021 12:00 AM     Problem: Knowledge Deficit and Care Coordination Needs Related to Management of Multiple Sclerosis   Priority: High     Long-Range Goal: Development of Plan Of Care to Address Care Coordination Needs and Knowledge Deficits for Management of Multiple Sclerosis, prediabetes and HLD   Start Date: 01/21/2021  Expected End Date: 01/21/2022  Priority: High  Note:   Current Barriers:  Knowledge Deficits related to plan of care for management of Multiple Sclerosis, prediabetes and HLD  Care Coordination needs related to Lacks knowledge of community resource: family psychiatric evaluation to assist with securing custody of granddaughter [name]   RNCM Clinical Goal(s):  Patient will demonstrate ongoing adherence to prescribed treatment plan for multiple sclerosis as evidenced by no inpatient hospitalizations or ED visits due to MS exacerbation or patient reports of MS symptom exacerbation through collaboration with RN Care manager, provider, and care team.   Interventions: Inter-disciplinary care team collaboration (see longitudinal plan of care) Evaluation of current treatment plan related to  self management and patient's adherence to plan as established by provider Discussed ordered Brain MRI, patient unable to complete MRI due to claustrophobia-she will contact Dr. Algie Coffer for plan/medication   Interdisciplinary Collaboration Interventions:  (Status: Goal on track:  Yes.) Short Term Goal   Collaborated with BSW to initiate plan of care to address needs related to Lacks knowledge of community resource: assessment of parental fitness  in patient with HLD and MS, prediabetes and obesity Collaboration with Lucious Groves, DO regarding development and update of comprehensive plan of care as evidenced  by provider attestation and co-signature Inter-disciplinary care team  collaboration   Arranged telephone appointment with [name] managed Medicaid BSW for 05/27/21 at 9:00 am to provide resources for assessment of parenting fitness-Patient attempting to schedule appointment for psychological evaluation, waiting for scheduling-needs provider that she has not had a professional relationship   Weight Loss Interventions:  (Status:  Goal on track:  Yes.) Long Term Goal-  Advised patient to discuss with primary care provider options regarding weight management Reviewed recommended dietary changes: avoid fad diets, make small/incremental dietary and exercise changes, eat at the table and avoid eating in front of the TV, plan management of cravings, monitor snacking and cravings in food diary Assessed social determinant of health barriers Advised patient to contact [place] for wellness benefits, including free fresh fruits and vegetables and WW benefits    Discussed the benefits of exercise, encouraged 30 minutes, 5 days a week   Hyperlipidemia Interventions:  (Status:  Condition stable.  Not addressed this visit.) Long Term Goal- lipid panel of 03/31/21 shows elevated LDL at 123 and total cholesterol at 200- patient states she received educational information mailed to her and has no follow up questions Medication review performed; medication list updated in electronic medical record.  Counseled on importance of regular laboratory monitoring as prescribed Ensured patient received educational information on HLD mailed to her  Provided opportunity to ask questions about educational material that was mailed to patient's home about controlling cholesterol and health meal planning Advised patient message will be sent to [facility] and Dr Heber [place] regarding follow up on referral written 04/26/21 to the Nutrition [facility] Diabetes Education Services re: assisting with management of weight, prediabetes and HLD    Prediabetes  (Status:  Goal on track:  NO.)  Long Term  Goal- patient states "I have not been contacted" by the Nutrition and Diabetes Education Services regarding referral written 04/28/21 Evaluation of current treatment plan related to  prediabetes ,  self-management and patient's adherence to plan as established by provider. Discussed plans with patient for ongoing care management follow up and provided patient with direct contact information for care management team Discussed the importance of weight loss, healthy diet, and exercise  Health Maintenance Interventions:( Goal on track: yes) Long Term Goal  (Status:  Condition stable.  Not addressed this visit.) Long Term Goal Patient interviewed about adult health maintenance status including  Colonoscopy    Regular Dental Care    Discussed referral to GI-[facility] unable to accept patient's insurance Advised patient to discuss needing a new referral to GI  Discussed upcoming Dental appointment with [facility] on 07/06/21 Advised to schedule Dermatology appointment   Patient Goals/Self-Care Activities: Take medications as prescribed   Attend all scheduled provider appointments Call pharmacy for medication refills 3-7 days in advance of running out of medications Perform all self care activities independently  Perform IADL's (shopping, preparing meals, housekeeping, managing finances) independently Call provider office for new concerns or questions  Call health plan member services to discuss benefits details specific to patient's needs Schedule colonoscopy

## 2021-07-27 NOTE — Patient Outreach (Signed)
Medicaid Managed Care   Nurse Care Manager Note  07/27/2021 Name:  Tamara [name] MRN:  [id number] DOB:  [birth year]  DDNNA KOHR is an 61 y.o. year old female who is a primary patient of [name], DO.  The [facility] Managed Care Coordination team was consulted for assistance with:    HLD Weight loss  Tamara [name] was given information about Medicaid Managed Care Coordination team services today. Tamara [name] Patient agreed to services and verbal consent obtained.  Engaged with patient by telephone for follow up visit in response to provider referral for case management and/or care coordination services.   Assessments/Interventions:  Review of past medical history, allergies, medications, health status, including review of consultants reports, laboratory and other test data, was performed as part of comprehensive evaluation and provision of chronic care management services.  SDOH (Social Determinants of Health) assessments and interventions performed: SDOH Interventions    Flowsheet Row Most Recent Value  SDOH Interventions   Food Insecurity Interventions Intervention Not Indicated  Transportation Interventions Intervention Not Indicated       Care Plan  Allergies  Allergen Reactions   Codeine Other (See Comments)    Delusions   Dilaudid [Hydromorphone Hcl] Swelling and Other (See Comments)    Tongue swells    Ditropan [Oxybutynin] Other (See Comments)    Burning sensation   Other Swelling and Other (See Comments)    Unnamed gel or antiseptic solution- Was applied to IV site with a needle- Turned the skin "black and blue" that remained (caused burning and phlebitis, also)   Paxil [Paroxetine Hcl] Other (See Comments)    Hallucinations and heavy periods    Tegretol [Carbamazepine] Itching   Trileptal [Oxcarbazepine] Swelling   Adhesive [Tape] Hives, Itching, Rash and Other (See Comments)    PAPER TAPE   Augmentin [Amoxicillin-Pot Clavulanate] Rash   Gabapentin Rash  and Other (See Comments)    Hallucinations and depression, also    Medications Reviewed Today     Reviewed by Heidi Dach, RN (Registered Nurse) on 07/27/21 at 1113  Med List Status: <None>   Medication Order Taking? Sig Documenting Provider Last Dose Status Informant  acetaminophen (TYLENOL) 650 MG CR tablet [id number] Yes Take 1,300 mg by mouth 3 (three) times daily as needed for pain. [provider] Taking Active Self           Med Note Antony Madura, Harlin Rain Jul 10, 2019  4:59 PM) Patient stated she does not "mix" Tylenol with Tramadol  baclofen (LIORESAL) 10 MG tablet [id number] Yes Take 10 mg by mouth 3 (three) times daily as needed for muscle spasms. [provider] Taking Active Self           Med Note Alben Deeds, Earlyne Iba   Fri Jan 21, 2021 10:02 AM) Takes rarely   Cholecalciferol (VITAMIN D3) 50 MCG (2000 UT) TABS [id number] Yes Take by mouth. [provider] Taking Active   Coenzyme Q10-Fish Oil-Vit E (CO-Q 10 OMEGA-3 FISH OIL PO) [id number] Yes Take 1 capsule by mouth daily. [provider] Taking Active Self  COLLAGEN PO [id number] Yes Take 1 Dose by mouth every other day. Powder; alternate with pill form. [provider] Taking Active Self  COLLAGEN PO [id number] Yes Take 3 tablets by mouth every other day. Pill; alternate with powder form. [provider] Taking Active Self  Disposable Gloves (NITRILE GLOVES MEDIUM) MISC [id number]  Use prn for bowel incontinence Sater, Pearletha Furl, MD  Active Self           Med Note Alben Deeds, Earlyne Iba   Fri Jan 21, 2021  9:47 AM) 5 M brand gloves  fluticasone (FLONASE) 50 MCG/ACT nasal spray [id number] Yes Place 2 sprays into both nostrils daily.  Patient taking differently: Place 2 sprays into both nostrils as needed for allergies.   Dolan Amen, MD Taking Active   hyoscyamine (LEVSIN) 0.125 MG tablet [id number] No Take 1 tablet (0.125 mg total) by mouth 3 (three) times daily.  Patient not taking:  Reported on 06/22/2021   Asa Lente, MD Not Taking Active            Med Note (Mitsuko Luera A   Wed Jul 27, 2021 11:04 AM) Has not taken since last week, due to drowsiness  Lacosamide 150 MG TABS [id number] No One po bid  Patient not taking: Reported on 07/27/2021   Asa Lente, MD Not Taking Active            Med Note (Ramar Nobrega A   Wed Jul 27, 2021 11:04 AM) Has not taken since last week, due to drowsiness  Lactobacillus (ACIDOPHILUS/PECTIN PO) [id number] Yes Take by mouth. [provider] Taking Active   loratadine (CLARITIN) 10 MG tablet [id number] Yes Take 10 mg by mouth daily as needed for allergies or rhinitis. [provider] Taking Active Self  MINOXIDIL PO [id number] No Take by mouth.  Patient not taking: Reported on 06/22/2021   [provider] Not Taking Active   Multiple Vitamins-Minerals (HAIR/SKIN/NAILS) CAPS [id number] Yes Take 1 capsule by mouth in the morning, at noon, and at bedtime. [provider] Taking Active Self  Multiple Vitamins-Minerals (ONE-A-DAY WOMENS PO) [id number] Yes Take 1 tablet by mouth daily. [provider] Taking Active Self  natalizumab (TYSABRI) 300 MG/15ML injection [id number] Yes Inject 300 mg into the vein every 28 (twenty-eight) days. [provider] Taking Active Self           Med Note Yetta Barre, EMMA L   Tue Aug 05, 2019 10:33 AM)  Received fax notification that pt is re-auth for Tysabri via touch program 08/05/19-02/25/20. [facility]  [facility] Site auth number: [id number]. Pt enrollment number: [id number].  pantoprazole (PROTONIX) 40 MG tablet [id number] Yes Take 1 tablet (40 mg total) by mouth daily. Dickie La, MD Taking Active   phentermine 37.5 MG capsule [id number] Yes Take 1 capsule by mouth once daily in the morning Dohmeier, Porfirio Mylar, MD Taking Active   Thiamine HCl (VITAMIN B-1 PO) [id number] Yes Take by mouth. [provider] Taking Active   tiZANidine  (ZANAFLEX) 4 MG tablet [id number] Yes 1/2 to 1 pill po tid Asa Lente, MD Taking Active   topiramate (TOPAMAX) 50 MG tablet [id number] Yes TAKE 1 TABLET BY MOUTH THREE TIMES DAILY Sater, Pearletha Furl, MD Taking Active            Med Note Alben Deeds, Earlyne Iba   Fri Jan 21, 2021  9:56 AM) Leanora Ivanoff for energy and to prevent seizures    traMADol (ULTRAM) 50 MG tablet [id number] No TAKE 1 TABLET BY MOUTH TWICE DAILY AS NEEDED FOR SEVERE PAIN  Patient not taking: Reported on 06/22/2021   Asa Lente, MD Not Taking Active            Med Note Alben Deeds, Earlyne Iba   Fri Jan 21, 2021  9:54 AM) Leanora Ivanoff rarely and only for severe pain and does not take with Tylenol  VITAMIN  D PO [id number] No Take 5,000 Units by mouth daily.  Patient not taking: Reported on 06/22/2021   [provider] Not Taking Active   Zinc 50 MG TABS [id number] Yes Take by mouth. [provider] Taking Active             Patient Active Problem List   Diagnosis Date Noted   Prediabetes 05/25/2021   Vitamin D deficiency 06/14/2020   B12 deficiency 03/22/2020   Left-sided chest wall pain 12/18/2019   Spasticity 08/18/2019   Sleep apnea 07/18/2019   Bilateral hand numbness 01/14/2019   RLQ abdominal pain 10/22/2018   Pedal edema 06/04/2018   Gait disturbance 06/04/2018   Bilateral knee pain 01/07/2018   Conjunctivitis 10/30/2017   Dental caries 07/30/2017   Dyspnea on exertion 07/30/2017   Class 1 obesity with body mass index (BMI) of 32.0 to 32.9 in adult 12/18/2016   Dysesthesia 10/11/2016   High risk medication use 07/04/2016   Multiple sclerosis (HCC) 06/19/2016   Neurogenic bowel    Neurogenic bladder    Allergic rhinitis 04/12/2016   Sciatica associated with disorder of lumbosacral spine 01/15/2014   GERD (gastroesophageal reflux disease) 07/21/2013   RBBB on EKG 08/01/2012   IBS (irritable bowel syndrome) 06/19/2012   Healthcare maintenance 06/19/2012    Conditions to be addressed/monitored per  PCP order:  HLD and weight loss  Care Plan : RN Care Manager Plan Of Care  Updates made by Heidi Dach, RN since 07/27/2021 12:00 AM     Problem: Knowledge Deficit and Care Coordination Needs Related to Management of Multiple Sclerosis   Priority: High     Long-Range Goal: Development of Plan Of Care to Address Care Coordination Needs and Knowledge Deficits for Management of Multiple Sclerosis, prediabetes and HLD   Start Date: 01/21/2021  Expected End Date: 01/21/2022  Priority: High  Note:   Current Barriers:  Knowledge Deficits related to plan of care for management of Multiple Sclerosis, prediabetes and HLD  Care Coordination needs related to Lacks knowledge of community resource: family psychiatric evaluation to assist with securing custody of granddaughter [name]   RNCM Clinical Goal(s):  Patient will demonstrate ongoing adherence to prescribed treatment plan for multiple sclerosis as evidenced by no inpatient hospitalizations or ED visits due to MS exacerbation or patient reports of MS symptom exacerbation through collaboration with RN Care manager, provider, and care team.   Interventions: Inter-disciplinary care team collaboration (see longitudinal plan of care) Evaluation of current treatment plan related to  self management and patient's adherence to plan as established by provider Discussed ordered Brain MRI, patient unable to complete MRI due to claustrophobia-she will contact Dr. Theadore Nan for plan/medication   Interdisciplinary Collaboration Interventions:  (Status: Goal on track:  Yes.) Short Term Goal   Collaborated with BSW to initiate plan of care to address needs related to Lacks knowledge of community resource: assessment of parental fitness  in patient with HLD and MS, prediabetes and obesity Collaboration with [name], DO regarding development and update of comprehensive plan of care as evidenced by provider attestation and co-signature Inter-disciplinary  care team collaboration   Arranged telephone appointment with Gus Puma managed Medicaid BSW for 05/27/21 at 9:00 am to provide resources for assessment of parenting fitness-Patient attempting to schedule appointment for psychological evaluation, waiting for scheduling-needs provider that she has not had a professional relationship   Weight Loss Interventions:  (Status:  Goal on track:  Yes.) Long Term Goal-  Advised patient to discuss  with primary care provider options regarding weight management Reviewed recommended dietary changes: avoid fad diets, make small/incremental dietary and exercise changes, eat at the table and avoid eating in front of the TV, plan management of cravings, monitor snacking and cravings in food diary Assessed social determinant of health barriers Advised patient to contact [facility] [phone] for wellness benefits, including free fresh fruits and vegetables and [employer] benefits    Discussed the benefits of exercise, encouraged 30 minutes, 5 days a week   Hyperlipidemia Interventions:  (Status:  Condition stable.  Not addressed this visit.) Long Term Goal- lipid panel of 03/31/21 shows elevated LDL at 123 and total cholesterol at 200- patient states she received educational information mailed to her and has no follow up questions Medication review performed; medication list updated in electronic medical record.  Counseled on importance of regular laboratory monitoring as prescribed Ensured patient received educational information on HLD mailed to her  Provided opportunity to ask questions about educational material that was mailed to patient's home about controlling cholesterol and health meal planning Advised patient message will be sent to [facility] and Dr Mikey Bussing regarding follow up on referral written 04/26/21 to the [facility] re: assisting with management of weight, prediabetes and HLD    Prediabetes  (Status:  Goal on track:  NO.)   Long Term Goal- patient states "I have not been contacted" by the [facility] regarding referral written 04/28/21 Evaluation of current treatment plan related to  prediabetes ,  self-management and patient's adherence to plan as established by provider. Discussed plans with patient for ongoing care management follow up and provided patient with direct contact information for care management team Discussed the importance of weight loss, healthy diet, and exercise  Health Maintenance Interventions:( Goal on track: yes) Long Term Goal  (Status:  Condition stable.  Not addressed this visit.) Long Term Goal Patient interviewed about adult health maintenance status including  Colonoscopy    Regular Dental Care    Discussed referral to GI-[facility] unable to accept patient's insurance Advised patient to discuss needing a new referral to GI  Discussed upcoming [facility] appointment with [facility] on 07/06/21 Advised to schedule Dermatology appointment   Patient Goals/Self-Care Activities: Take medications as prescribed   Attend all scheduled provider appointments Call pharmacy for medication refills 3-7 days in advance of running out of medications Perform all self care activities independently  Perform IADL's (shopping, preparing meals, housekeeping, managing finances) independently Call provider office for new concerns or questions  Call health plan member services to discuss benefits details specific to patient's needs Schedule colonoscopy         Follow Up:  Patient agrees to Care Plan and Follow-up.  Plan: The Managed Medicaid care management team will reach out to the patient again over the next 30 days.  Date/time of next scheduled RN care management/care coordination outreach:  08/26/21 @ 10:30am  [name] RN, BSN [place]  [facility] RN Care Coordinator

## 2021-07-28 ENCOUNTER — Non-Acute Institutional Stay (HOSPITAL_COMMUNITY)
Admission: RE | Admit: 2021-07-28 | Discharge: 2021-07-28 | Disposition: A | Discharge status: Home / Self Care | Payer: Medicaid Other | Source: Ambulatory Visit | Attending: Internal Medicine | Admitting: Internal Medicine

## 2021-07-28 ENCOUNTER — Ambulatory Visit (HOSPITAL_COMMUNITY)
Admission: EM | Admit: 2021-07-28 | Discharge: 2021-07-28 | Disposition: A | Discharge status: Home / Self Care | Payer: Medicaid Other | Attending: Internal Medicine | Admitting: Internal Medicine

## 2021-07-28 ENCOUNTER — Encounter (HOSPITAL_COMMUNITY): Payer: Self-pay

## 2021-07-28 DIAGNOSIS — G35 Multiple sclerosis: Secondary | ICD-10-CM | POA: Insufficient documentation

## 2021-07-28 DIAGNOSIS — B0222 Postherpetic trigeminal neuralgia: Secondary | ICD-10-CM | POA: Diagnosis not present

## 2021-07-28 MED ORDER — SODIUM CHLORIDE 0.9 % IV SOLN
INTRAVENOUS | Status: DC | PRN
Start: 2021-07-28 — End: 2021-07-29

## 2021-07-28 MED ORDER — SODIUM CHLORIDE 0.9 % IV SOLN
300.0000 mg | INTRAVENOUS | Status: DC
  Administered 2021-07-28: 300 mg via INTRAVENOUS
  Filled 2021-07-28: qty 15

## 2021-07-28 MED ORDER — VALACYCLOVIR HCL 1 G PO TABS: 1000.0000 mg | ORAL_TABLET | Freq: Three times a day (TID) | ORAL | 0 refills | Status: AC

## 2021-07-28 NOTE — ED Triage Notes (Signed)
Pt c/o rash to rt neck/cheek x2 days now causing swelling and pain to rt ear, and head pain. Took tylenol with no relief.

## 2021-07-28 NOTE — Discharge Instructions (Addendum)
Please take medications as prescribed Maintain adequate hydration Tylenol or tramadol as needed for pain If symptoms worsen please return to urgent care to be reevaluated.

## 2021-07-28 NOTE — Progress Notes (Signed)
PATIENT CARE CENTER NOTE     Diagnosis: Multiple Sclerosis G35     Provider: Arlice Colt, MD     Procedure: Tysabri infusion      Note: Patient received Tysabri infusion (dose 9 of 12) via PIV. Tolerated well with no adverse reaction.  Patient declined to wait the 1 hour observation post-infusion.  Vital signs stable. AVS offered but patient refused. Patient will schedule next appointment at the front desk. Alert, oriented and ambulatory at discharge.

## 2021-08-01 NOTE — ED Provider Notes (Signed)
[facility]    CSN: [id number] Arrival date & time: 07/28/21  1622      History   Chief Complaint Chief Complaint  Patient presents with   Rash    HPI Tamara [name] is a 61 y.o. female with a history of multiple sclerosis on natalizumab comes to urgent care with rash on the right side of the face of 2 days duration.  Patient says the symptoms started with some numbness behind the right ear and the right side of the face.  Patient noticed some papular rash on the right side of the face about 24 hours after numbness were noted.  No fever or chills.  Patient has a history of chickenpox as a child.  No oral ulcers.  No eye redness or sensation of foreign body in the right eye.Marland Kitchen   HPI  Past Medical History:  Diagnosis Date   Anemia    Bowel obstruction (HCC)    CKD (chronic kidney disease), stage II    based on labs   GERD (gastroesophageal reflux disease)    IBS (irritable bowel syndrome)    at age of 89   Multiple sclerosis (HCC)    RBBB    Sciatica 2009   Vision abnormalities     Patient Active Problem List   Diagnosis Date Noted   Prediabetes 05/25/2021   Vitamin D deficiency 06/14/2020   B12 deficiency 03/22/2020   Left-sided chest wall pain 12/18/2019   Spasticity 08/18/2019   Sleep apnea 07/18/2019   Bilateral hand numbness 01/14/2019   RLQ abdominal pain 10/22/2018   Pedal edema 06/04/2018   Gait disturbance 06/04/2018   Bilateral knee pain 01/07/2018   Conjunctivitis 10/30/2017   Dental caries 07/30/2017   Dyspnea on exertion 07/30/2017   Class 1 obesity with body mass index (BMI) of 32.0 to 32.9 in adult 12/18/2016   Dysesthesia 10/11/2016   High risk medication use 07/04/2016   Multiple sclerosis (HCC) 06/19/2016   Neurogenic bowel    Neurogenic bladder    Allergic rhinitis 04/12/2016   Sciatica associated with disorder of lumbosacral spine 01/15/2014   GERD (gastroesophageal reflux disease) 07/21/2013   RBBB on EKG 08/01/2012   IBS  (irritable bowel syndrome) 06/19/2012   Healthcare maintenance 06/19/2012    Past Surgical History:  Procedure Laterality Date   ABDOMINAL HYSTERECTOMY  2008   cervix and right ovary still intact   KNEE ARTHROSCOPY  2010 and 2011   Left knee, x2    OB History     Gravida  2   Para  2   Term  2   Preterm  0   AB  0   Living  2      SAB  0   IAB  0   Ectopic  0   Multiple  0   Live Births               Home Medications    Prior to Admission medications   Medication Sig Start Date End Date Taking? Authorizing Provider  valACYclovir (VALTREX) 1000 MG tablet Take 1 tablet (1,000 mg total) by mouth 3 (three) times daily for 7 days. 07/28/21 08/04/21 Yes Hudson Lehmkuhl, Britta Mccreedy, MD  acetaminophen (TYLENOL) 650 MG CR tablet Take 1,300 mg by mouth 3 (three) times daily as needed for pain.    [provider]  baclofen (LIORESAL) 10 MG tablet Take 10 mg by mouth 3 (three) times daily as needed for muscle spasms.    [provider]  Cholecalciferol (VITAMIN D3) 50 MCG (2000 UT) TABS Take by mouth.    [provider]  Coenzyme Q10-Fish Oil-Vit E (CO-Q 10 OMEGA-3 FISH OIL PO) Take 1 capsule by mouth daily.    [provider]  COLLAGEN PO Take 1 Dose by mouth every other day. Powder; alternate with pill form.    [provider]  COLLAGEN PO Take 3 tablets by mouth every other day. Pill; alternate with powder form.    [provider]  Disposable Gloves (NITRILE GLOVES MEDIUM) MISC Use prn for bowel incontinence 06/14/20   Sater, Pearletha Furl, MD  fluticasone (FLONASE) 50 MCG/ACT nasal spray Place 2 sprays into both nostrils daily. Patient taking differently: Place 2 sprays into both nostrils as needed for allergies. 10/14/19   Dolan Amen, MD  hyoscyamine (LEVSIN) 0.125 MG tablet Take 1 tablet (0.125 mg total) by mouth 3 (three) times daily. Patient not taking: Reported on 06/22/2021 06/14/20   Asa Lente, MD  Lacosamide 150  MG TABS One po bid Patient not taking: Reported on 07/27/2021 06/02/21   Asa Lente, MD  Lactobacillus (ACIDOPHILUS/PECTIN PO) Take by mouth.    [provider]  loratadine (CLARITIN) 10 MG tablet Take 10 mg by mouth daily as needed for allergies or rhinitis.    [provider]  MINOXIDIL PO Take by mouth. Patient not taking: Reported on 06/22/2021    [provider]  Multiple Vitamins-Minerals (HAIR/SKIN/NAILS) CAPS Take 1 capsule by mouth in the morning, at noon, and at bedtime.    [provider]  Multiple Vitamins-Minerals (ONE-A-DAY WOMENS PO) Take 1 tablet by mouth daily.    [provider]  natalizumab (TYSABRI) 300 MG/15ML injection Inject 300 mg into the vein every 28 (twenty-eight) days.    [provider]  pantoprazole (PROTONIX) 40 MG tablet Take 1 tablet (40 mg total) by mouth daily. 07/07/21   Dickie La, MD  phentermine 37.5 MG capsule Take 1 capsule by mouth once daily in the morning 06/06/21   Dohmeier, Porfirio Mylar, MD  Thiamine HCl (VITAMIN B-1 PO) Take by mouth.    [provider]  tiZANidine (ZANAFLEX) 4 MG tablet 1/2 to 1 pill po tid 06/02/21   Sater, Pearletha Furl, MD  topiramate (TOPAMAX) 50 MG tablet TAKE 1 TABLET BY MOUTH THREE TIMES DAILY 10/18/20   Sater, Pearletha Furl, MD  traMADol (ULTRAM) 50 MG tablet TAKE 1 TABLET BY MOUTH TWICE DAILY AS NEEDED FOR SEVERE PAIN Patient not taking: Reported on 06/22/2021 02/17/19   Asa Lente, MD  VITAMIN D PO Take 5,000 Units by mouth daily. Patient not taking: Reported on 06/22/2021    [provider]  Zinc 50 MG TABS Take by mouth.    [provider]    Family History Family History  Problem Relation Age of Onset   Cancer Father        Gallbladder   Gallbladder disease Father    Hypertension Brother    Gallbladder disease Paternal Grandmother    Colon cancer Neg Hx    Colon polyps Neg Hx    Esophageal cancer Neg Hx    Kidney disease Neg Hx     Social  History Social History   Tobacco Use   Smoking status: Never   Smokeless tobacco: Never  Vaping Use   Vaping Use: Never used  Substance Use Topics   Alcohol use: No    Alcohol/week: 0.0 standard drinks of alcohol   Drug use: No  Allergies   Codeine, Dilaudid [hydromorphone hcl], Ditropan [oxybutynin], Other, Paxil [paroxetine hcl], Tegretol [carbamazepine], Trileptal [oxcarbazepine], Adhesive [tape], Augmentin [amoxicillin-pot clavulanate], and Gabapentin   Review of Systems Review of Systems  Musculoskeletal: Negative.   Skin:  Positive for rash. Negative for color change, pallor and wound.     Physical Exam Triage Vital Signs ED Triage Vitals [07/28/21 1743]  Enc Vitals Group     BP 130/78     Pulse Rate 82     Resp 18     Temp 98.4 F (36.9 C)     Temp Source Oral     SpO2 99 %     Weight      Height      Head Circumference      Peak Flow      Pain Score 10     Pain Loc      Pain Edu?      Excl. in GC?    No data found.  Updated Vital Signs BP 130/78 (BP Location: Right Arm)   Pulse 82   Temp 98.4 F (36.9 C) (Oral)   Resp 18   SpO2 99%   Visual Acuity Right Eye Distance:   Left Eye Distance:   Bilateral Distance:    Right Eye Near:   Left Eye Near:    Bilateral Near:     Physical Exam Vitals and nursing note reviewed.  Constitutional:      Appearance: Normal appearance.  Cardiovascular:     Rate and Rhythm: Normal rate and regular rhythm.  Skin:    Comments: Papular rash over the right side of the face.  Distribution of rash is consistent with sensory branch of right trigeminal nerve.  No other cranial nerve abnormalities noted.  Neurological:     Mental Status: She is alert.      UC Treatments / Results  Labs (all labs ordered are listed, but only abnormal results are displayed) Labs Reviewed - No data to display  EKG   Radiology No results found.  Procedures Procedures (including critical care time)  Medications  Ordered in UC Medications - No data to display  Initial Impression / Assessment and Plan / UC Course  I have reviewed the triage vital signs and the nursing notes.  Pertinent labs & imaging results that were available during my care of the patient were reviewed by me and considered in my medical decision making (see chart for details).     1.  Trigeminal herpes zoster: Valacyclovir 1000 mg 3 times daily for 7 days Tylenol or tramadol as needed for pain If rash worsens please return to urgent care to be reevaluated. Final Clinical Impressions(s) / UC Diagnoses   Final diagnoses:  Trigeminal herpes zoster     Discharge Instructions      Please take medications as prescribed Maintain adequate hydration Tylenol or tramadol as needed for pain If symptoms worsen please return to urgent care to be reevaluated.   ED Prescriptions     Medication Sig Dispense Auth. Provider   valACYclovir (VALTREX) 1000 MG tablet Take 1 tablet (1,000 mg total) by mouth 3 (three) times daily for 7 days. 21 tablet Yaretzi Ernandez, Britta Mccreedy, MD      PDMP not reviewed this encounter.   Merrilee Jansky, MD 08/03/21 [phone]

## 2021-08-04 ENCOUNTER — Encounter: Payer: Self-pay | Admitting: Internal Medicine

## 2021-08-04 ENCOUNTER — Other Ambulatory Visit: Payer: Self-pay

## 2021-08-04 ENCOUNTER — Ambulatory Visit (INDEPENDENT_AMBULATORY_CARE_PROVIDER_SITE_OTHER): Payer: Medicaid Other | Admitting: Internal Medicine

## 2021-08-04 VITALS — BP 122/66 | HR 73 | Temp 97.9°F | Ht 63.0 in | Wt 186.6 lb

## 2021-08-04 DIAGNOSIS — M79671 Pain in right foot: Secondary | ICD-10-CM | POA: Diagnosis not present

## 2021-08-04 DIAGNOSIS — R600 Localized edema: Secondary | ICD-10-CM

## 2021-08-04 DIAGNOSIS — L659 Nonscarring hair loss, unspecified: Secondary | ICD-10-CM | POA: Diagnosis not present

## 2021-08-04 DIAGNOSIS — B029 Zoster without complications: Secondary | ICD-10-CM

## 2021-08-04 DIAGNOSIS — G35 Multiple sclerosis: Secondary | ICD-10-CM

## 2021-08-04 DIAGNOSIS — E6609 Other obesity due to excess calories: Secondary | ICD-10-CM | POA: Diagnosis not present

## 2021-08-04 DIAGNOSIS — M79672 Pain in left foot: Secondary | ICD-10-CM

## 2021-08-04 DIAGNOSIS — K029 Dental caries, unspecified: Secondary | ICD-10-CM

## 2021-08-04 DIAGNOSIS — Z6832 Body mass index (BMI) 32.0-32.9, adult: Secondary | ICD-10-CM

## 2021-08-04 MED ORDER — LORAZEPAM 1 MG PO TABS: 1.0000 mg | ORAL_TABLET | Freq: Once | ORAL | 0 refills | Status: DC | PRN

## 2021-08-04 NOTE — Assessment & Plan Note (Signed)
She reports she has discussed using minoxidil with Dr. Felecia Shelling however when she sees dermatology she would also like to discuss this with them.

## 2021-08-04 NOTE — Assessment & Plan Note (Signed)
Following with Dr. Felecia Shelling, I did write a letter in support of her gloves

## 2021-08-04 NOTE — Progress Notes (Signed)
Established Patient Office Visit  Subjective   Patient ID: Tamara [name], female    DOB: 04-18-1960  Age: 61 y.o. MRN: [id number]  Chief Complaint  Patient presents with   Follow-up    Chronic pain    Tamara [name] presents for follow-up of multiple issues including MS.  First she is requesting a letter from me she notes that due to her MS she has some sharp electric sensations in her hands she finds that wearing nitrile exam gloves while doing chores helps alleviate this.  She utilizes 2 different steps of gloves some with a tactile pattern in them which helps control the pain better however for more intricate work she will use a nontactile glove.  She is hoping that a letter from me will help cover the expense of utilizing these gloves.  Second she reports she has an upcoming MRI with neurology she is requesting a sedative for it due to anxiety and claustrophobia.  Third she was recently in the emergency department and diagnosed with a zoster outbreak.  She has been taking Valtrex prescribed by the ED physician.  Pain is decreased and she is feeling better.  She would like to be referred to a dermatologist for 3 issues 1 to follow-up the zoster which I reassured her appears to be resolving.  Second for a full body skin exam and third to get their opinion if minoxidil is appropriate she notes that Dr. Durene Cal here her neurologist has told her that it would be fine to use.        Objective:     BP 122/66 (BP Location: Left Arm, Patient Position: Sitting, Cuff Size: Normal)   Pulse 73   Temp 97.9 F (36.6 C) (Oral)   Ht '5\' 3"'  (1.6 m)   Wt 186 lb 9.6 oz (84.6 kg)   SpO2 100%   BMI 33.05 kg/m  BP Readings from Last 3 Encounters:  08/04/21 122/66  07/28/21 130/78  07/28/21 108/62   Wt Readings from Last 3 Encounters:  08/04/21 186 lb 9.6 oz (84.6 kg)  06/02/21 185 lb 3.2 oz (84 kg)  03/31/21 185 lb 14.4 oz (84.3 kg)      Physical Exam Constitutional:      Appearance: Normal  appearance. She is obese.     Comments: Wearing nitrile exam gloves  Cardiovascular:     Rate and Rhythm: Normal rate and regular rhythm.     Pulses: Normal pulses.     Heart sounds: Normal heart sounds.  Pulmonary:     Effort: Pulmonary effort is normal.     Breath sounds: Normal breath sounds.  Musculoskeletal:     Right lower leg: Edema (1+ below knee) present.     Left lower leg: Edema (2+ below knee) present.  Skin:    Comments: Vesicular rash behind right ear with small aspect on anti- tragus, crusting over stages.  Neurological:     Mental Status: She is alert.      No results found for any visits on 08/04/21.  Last metabolic panel Lab Results  Component Value Date   GLUCOSE 89 03/31/2021   NA 144 03/31/2021   K 4.2 03/31/2021   CL 109 (H) 03/31/2021   CO2 23 03/31/2021   BUN 15 03/31/2021   CREATININE 0.94 03/31/2021   EGFR 69 03/31/2021   CALCIUM 9.4 03/31/2021   PROT 6.5 03/31/2021   ALBUMIN 4.5 03/31/2021   LABGLOB 2.0 03/31/2021   AGRATIO 2.3 (H) 03/31/2021   BILITOT <0.2  03/31/2021   ALKPHOS 72 03/31/2021   AST 13 03/31/2021   ALT 15 03/31/2021   ANIONGAP 10 09/07/2020   Last hemoglobin A1c Lab Results  Component Value Date   HGBA1C 5.9 (H) 03/31/2021   Last vitamin D Lab Results  Component Value Date   VD25OH 60.3 03/31/2021      The 10-year ASCVD risk score (Arnett DK, et al., 2019) is: 4.1%    Assessment & Plan:   Problem List Items Addressed This Visit       Digestive   Dental caries    She notes that she has seen a dentist however has been told that she needs extensive work done and she is not sure she can pay for it, she notes that Medicaid will only pay $3000        Nervous and Auditory   Multiple sclerosis ([place]) (Chronic)    Following with Dr. Felecia Shelling, I did write a letter in support of her gloves        Musculoskeletal and Integument   Alopecia    She reports she has discussed using minoxidil with Dr. Felecia Shelling however when  she sees dermatology she would also like to discuss this with them.        Other   Class 1 obesity with body mass index (BMI) of 32.0 to 32.9 in adult (Chronic)    Weight is stable continues on phentermine I do think a GLP-1 receptor agonist would be beneficial however not covered by her insurance.      Pedal edema    She continues to have bilateral pedal edema no evidence of heart failure.  Previous work-up of this is also negative for DVT.  At this point I would go ahead and obtain venous reflux studies.      Relevant Orders   VAS [place] LOWER EXTREMITY VENOUS REFLUX   Herpes zoster without complication - Primary    Zoster rash is improving.  I have put in a referral to dermatology per her request I do think the zoster rash will fully resolved however she is wanting a full skin exam for cancer screening.        Relevant Orders   Ambulatory referral to Dermatology   Foot pain, bilateral    She reports she was previously told by orthopedics that she has flat arches and that she needs custom arch support.  However she has not followed up with orthopedics in some time she is having bilateral foot pain and wonders about seeing orthopedics or podiatry.  I discussed that I think this issue could likely be addressed by podiatry and we will put in referral.      Relevant Orders   Ambulatory referral to Podiatry    Return in about 4 months (around 12/05/2021).    Lucious Groves, DO

## 2021-08-04 NOTE — Assessment & Plan Note (Signed)
Weight is stable continues on phentermine I do think a GLP-1 receptor agonist would be beneficial however not covered by her insurance.

## 2021-08-04 NOTE — Assessment & Plan Note (Signed)
She continues to have bilateral pedal edema no evidence of heart failure.  Previous work-up of this is also negative for DVT.  At this point I would go ahead and obtain venous reflux studies.

## 2021-08-04 NOTE — Assessment & Plan Note (Signed)
She reports she was previously told by orthopedics that she has flat arches and that she needs custom arch support.  However she has not followed up with orthopedics in some time she is having bilateral foot pain and wonders about seeing orthopedics or podiatry.  I discussed that I think this issue could likely be addressed by podiatry and we will put in referral.

## 2021-08-04 NOTE — Assessment & Plan Note (Signed)
She notes that she has seen a dentist however has been told that she needs extensive work done and she is not sure she can pay for it, she notes that Medicaid will only pay $3000

## 2021-08-04 NOTE — Assessment & Plan Note (Signed)
Zoster rash is improving.  I have put in a referral to dermatology per her request I do think the zoster rash will fully resolved however she is wanting a full skin exam for cancer screening.

## 2021-08-12 ENCOUNTER — Ambulatory Visit (HOSPITAL_COMMUNITY)
Admission: RE | Admit: 2021-08-12 | Payer: Medicaid Other | Source: Ambulatory Visit | Attending: Internal Medicine | Admitting: Internal Medicine

## 2021-08-12 ENCOUNTER — Ambulatory Visit (HOSPITAL_COMMUNITY)
Admission: RE | Admit: 2021-08-12 | Discharge: 2021-08-12 | Disposition: A | Discharge status: Home / Self Care | Payer: Medicaid Other | Source: Ambulatory Visit | Attending: Internal Medicine | Admitting: Internal Medicine

## 2021-08-12 DIAGNOSIS — R600 Localized edema: Secondary | ICD-10-CM | POA: Diagnosis not present

## 2021-08-19 ENCOUNTER — Ambulatory Visit (INDEPENDENT_AMBULATORY_CARE_PROVIDER_SITE_OTHER): Payer: Medicaid Other

## 2021-08-19 ENCOUNTER — Ambulatory Visit (INDEPENDENT_AMBULATORY_CARE_PROVIDER_SITE_OTHER): Payer: Medicaid Other | Admitting: Podiatry

## 2021-08-19 DIAGNOSIS — B351 Tinea unguium: Secondary | ICD-10-CM

## 2021-08-19 DIAGNOSIS — M79671 Pain in right foot: Secondary | ICD-10-CM | POA: Diagnosis not present

## 2021-08-19 DIAGNOSIS — M2141 Flat foot [pes planus] (acquired), right foot: Secondary | ICD-10-CM

## 2021-08-19 DIAGNOSIS — M722 Plantar fascial fibromatosis: Secondary | ICD-10-CM

## 2021-08-19 DIAGNOSIS — M7989 Other specified soft tissue disorders: Secondary | ICD-10-CM | POA: Diagnosis not present

## 2021-08-19 DIAGNOSIS — M79672 Pain in left foot: Secondary | ICD-10-CM

## 2021-08-19 DIAGNOSIS — M79675 Pain in left toe(s): Secondary | ICD-10-CM

## 2021-08-19 DIAGNOSIS — M2142 Flat foot [pes planus] (acquired), left foot: Secondary | ICD-10-CM | POA: Diagnosis not present

## 2021-08-19 DIAGNOSIS — M778 Other enthesopathies, not elsewhere classified: Secondary | ICD-10-CM

## 2021-08-19 DIAGNOSIS — M79674 Pain in right toe(s): Secondary | ICD-10-CM | POA: Diagnosis not present

## 2021-08-19 MED ORDER — AMMONIUM LACTATE 12 % EX LOTN: 1.0000 | TOPICAL_LOTION | CUTANEOUS | 0 refills | Status: DC | PRN

## 2021-08-19 MED ORDER — CICLOPIROX 8 % EX SOLN: Freq: Every day | CUTANEOUS | 2 refills | Status: DC

## 2021-08-19 NOTE — Patient Instructions (Signed)
WEARING INSTRUCTIONS FOR ORTHOTICS  Don't expect to be comfortable wearing your orthotic devices for the first time.  Like eyeglasses, you may be aware of them as time passes, they will not be uncomfortable and you will enjoy wearing them.  FOLLOW THESE INSTRUCTIONS EXACTLY!  Wear your orthotic devices for:       Not more than 1 hour the first day.       Not more than 2 hours the second day.       Not more than 3 hours the third day and so on.        Or wear them for as long as they feel comfortable.       If you experience discomfort in your feet or legs take them out.  When feet & legs feel       better, put them back in.  You do need to be consistent and wear them a little        everyday. 2.   If at any time the orthotic devices become acutely uncomfortable before the       time for that particular day, STOP WEARING THEM. 3.   On the next day, do not increase the wearing time. 4.   Subsequently, increase the wearing time by 15-30 minutes only if comfortable to do       so. 5.   You will be seen by your doctor about 2-4 weeks after you receive your orthotic       devices, at which time you will probably be wearing your devices comfortably        for about 8 hours or more a day. 6.   Some patients occasionally report mild aches or discomfort in other parts of the of       body such as the knees, hips or back after 3 or 4 consecutive hours of wear.  If this       is the case with you, do not extend your wearing time.  Instead, cut it back an hour or       two.  In all likelihood, these symptoms will disappear in a short period of time as your       body posture realigns itself and functions more efficiently. 7.   It is possible that your orthotic device may require some small changes or adjustment       to improve their function or make them more comfortable.   This is usually not done       before one to three months have elapsed.  These adjustments are made in        accordance with  the changed position your feet are assuming as a result of       improved biomechanical function. 8.   In women's shoes, it's not unusual for your heel to slip out of the shoe, particularly if       they are step-in-shoes.  If this is the case, try other shoes or other styles.  Try to       purchase shoes which have deeper heal seats or higher heel counters. 9.   Squeaking of orthotics devices in the shoes is due to the movement of the devices       when they are functioning normally.  To eliminate squeaking, simply dust some       baby powder into your shoes before inserting the devices.  If this does not work,          apply soap or wax to the edges of the orthotic devices or put a tissue into the shoes. 10. It is important that you follow these directions explicitly.  Failure to do so will simply       prolong the adjustment period or create problems which are easily avoided.  It makes       no difference if you are wearing your orthotic devices for only a few hours after        several months, so long as you are wearing them comfortably for those hours. 11. If you have any questions or complaints, contact our office.  We have no way of       knowing about your problems unless you tell us.  If we do not hear from you, we will       assume that you are proceeding well.  Plantar Fasciitis (Heel Spur Syndrome) with Rehab The plantar fascia is a fibrous, ligament-like, soft-tissue structure that spans the bottom of the foot. Plantar fasciitis is a condition that causes pain in the foot due to inflammation of the tissue. SYMPTOMS  Pain and tenderness on the underneath side of the foot. Pain that worsens with standing or walking. CAUSES  Plantar fasciitis is caused by irritation and injury to the plantar fascia on the underneath side of the foot. Common mechanisms of injury include: Direct trauma to bottom of the foot. Damage to a small nerve that runs under the foot where the main fascia  attaches to the heel bone. Stress placed on the plantar fascia due to bone spurs. RISK INCREASES WITH:  Activities that place stress on the plantar fascia (running, jumping, pivoting, or cutting). Poor strength and flexibility. Improperly fitted shoes. Tight calf muscles. Flat feet. Failure to warm-up properly before activity. Obesity. PREVENTION Warm up and stretch properly before activity. Allow for adequate recovery between workouts. Maintain physical fitness: Strength, flexibility, and endurance. Cardiovascular fitness. Maintain a health body weight. Avoid stress on the plantar fascia. Wear properly fitted shoes, including arch supports for individuals who have flat feet.  PROGNOSIS  If treated properly, then the symptoms of plantar fasciitis usually resolve without surgery. However, occasionally surgery is necessary.  RELATED COMPLICATIONS  Recurrent symptoms that may result in a chronic condition. Problems of the lower back that are caused by compensating for the injury, such as limping. Pain or weakness of the foot during push-off following surgery. Chronic inflammation, scarring, and partial or complete fascia tear, occurring more often from repeated injections.  TREATMENT  Treatment initially involves the use of ice and medication to help reduce pain and inflammation. The use of strengthening and stretching exercises may help reduce pain with activity, especially stretches of the Achilles tendon. These exercises may be performed at home or with a therapist. Your caregiver may recommend that you use heel cups of arch supports to help reduce stress on the plantar fascia. Occasionally, corticosteroid injections are given to reduce inflammation. If symptoms persist for greater than 6 months despite non-surgical (conservative), then surgery may be recommended.   MEDICATION  If pain medication is necessary, then nonsteroidal anti-inflammatory medications, such as aspirin and  ibuprofen, or other minor pain relievers, such as acetaminophen, are often recommended. Do not take pain medication within 7 days before surgery. Prescription pain relievers may be given if deemed necessary by your caregiver. Use only as directed and only as much as you need. Corticosteroid injections may be given by your caregiver. These injections should be reserved for the  most serious cases, because they may only be given a certain number of times.  HEAT AND COLD Cold treatment (icing) relieves pain and reduces inflammation. Cold treatment should be applied for 10 to 15 minutes every 2 to 3 hours for inflammation and pain and immediately after any activity that aggravates your symptoms. Use ice packs or massage the area with a piece of ice (ice massage). Heat treatment may be used prior to performing the stretching and strengthening activities prescribed by your caregiver, physical therapist, or athletic trainer. Use a heat pack or soak the injury in warm water.  SEEK IMMEDIATE MEDICAL CARE IF: Treatment seems to offer no benefit, or the condition worsens. Any medications produce adverse side effects.  EXERCISES- RANGE OF MOTION (ROM) AND STRETCHING EXERCISES - Plantar Fasciitis (Heel Spur Syndrome) These exercises may help you when beginning to rehabilitate your injury. Your symptoms may resolve with or without further involvement from your physician, physical therapist or athletic trainer. While completing these exercises, remember:  Restoring tissue flexibility helps normal motion to return to the joints. This allows healthier, less painful movement and activity. An effective stretch should be held for at least 30 seconds. A stretch should never be painful. You should only feel a gentle lengthening or release in the stretched tissue.  RANGE OF MOTION - Toe Extension, Flexion Sit with your right / left leg crossed over your opposite knee. Grasp your toes and gently pull them back toward  the top of your foot. You should feel a stretch on the bottom of your toes and/or foot. Hold this stretch for 10 seconds. Now, gently pull your toes toward the bottom of your foot. You should feel a stretch on the top of your toes and or foot. Hold this stretch for 10 seconds. Repeat  times. Complete this stretch 3 times per day.   RANGE OF MOTION - Ankle Dorsiflexion, Active Assisted Remove shoes and sit on a chair that is preferably not on a carpeted surface. Place right / left foot under knee. Extend your opposite leg for support. Keeping your heel down, slide your right / left foot back toward the chair until you feel a stretch at your ankle or calf. If you do not feel a stretch, slide your bottom forward to the edge of the chair, while still keeping your heel down. Hold this stretch for 10 seconds. Repeat 3 times. Complete this stretch 2 times per day.   STRETCH  Gastroc, Standing Place hands on wall. Extend right / left leg, keeping the front knee somewhat bent. Slightly point your toes inward on your back foot. Keeping your right / left heel on the floor and your knee straight, shift your weight toward the wall, not allowing your back to arch. You should feel a gentle stretch in the right / left calf. Hold this position for 10 seconds. Repeat 3 times. Complete this stretch 2 times per day.  STRETCH  Soleus, Standing Place hands on wall. Extend right / left leg, keeping the other knee somewhat bent. Slightly point your toes inward on your back foot. Keep your right / left heel on the floor, bend your back knee, and slightly shift your weight over the back leg so that you feel a gentle stretch deep in your back calf. Hold this position for 10 seconds. Repeat 3 times. Complete this stretch 2 times per day.  STRETCH  Gastrocsoleus, Standing  Note: This exercise can place a lot of stress on your foot and ankle. Please  complete this exercise only if specifically instructed by your  caregiver.  Place the ball of your right / left foot on a step, keeping your other foot firmly on the same step. Hold on to the wall or a rail for balance. Slowly lift your other foot, allowing your body weight to press your heel down over the edge of the step. You should feel a stretch in your right / left calf. Hold this position for 10 seconds. Repeat this exercise with a slight bend in your right / left knee. Repeat 3 times. Complete this stretch 2 times per day.   STRENGTHENING EXERCISES - Plantar Fasciitis (Heel Spur Syndrome)  These exercises may help you when beginning to rehabilitate your injury. They may resolve your symptoms with or without further involvement from your physician, physical therapist or athletic trainer. While completing these exercises, remember:  Muscles can gain both the endurance and the strength needed for everyday activities through controlled exercises. Complete these exercises as instructed by your physician, physical therapist or athletic trainer. Progress the resistance and repetitions only as guided.  STRENGTH - Towel Curls Sit in a chair positioned on a non-carpeted surface. Place your foot on a towel, keeping your heel on the floor. Pull the towel toward your heel by only curling your toes. Keep your heel on the floor. Repeat 3 times. Complete this exercise 2 times per day.  STRENGTH - Ankle Inversion Secure one end of a rubber exercise band/tubing to a fixed object (table, pole). Loop the other end around your foot just before your toes. Place your fists between your knees. This will focus your strengthening at your ankle. Slowly, pull your big toe up and in, making sure the band/tubing is positioned to resist the entire motion. Hold this position for 10 seconds. Have your muscles resist the band/tubing as it slowly pulls your foot back to the starting position. Repeat 3 times. Complete this exercises 2 times per day.  Document Released: 01/16/2005  Document Revised: 04/10/2011 Document Reviewed: 04/30/2008 [facility] Patient Information 2014 [place], [place].

## 2021-08-19 NOTE — Progress Notes (Unsigned)
Subjective:   Patient ID: Tamara [name], female   DOB: 61 y.o.   MRN: [id number]   HPI Chief Complaint  Patient presents with   Foot Pain    Foot pain Arch pain , bilateral Referring Provider: HOFFMAN, ERIK C Xrays done today, Rate of pain 4 of of 10. Swelling     States that the ankles are rolling in. She use to have inserts for flatfeet. She also has been getting pain in the arch. She has not had any recent treatment.  She gets swelling to the ankle. She does wear compression socks that she got from [place].   She has MS and she cannot trim the nails herself   ROS      Objective:  Physical Exam  ***     Assessment:  ***     Plan:  ***

## 2021-08-26 ENCOUNTER — Other Ambulatory Visit: Payer: Self-pay | Admitting: *Deleted

## 2021-08-26 ENCOUNTER — Other Ambulatory Visit: Payer: Self-pay

## 2021-08-26 DIAGNOSIS — J069 Acute upper respiratory infection, unspecified: Secondary | ICD-10-CM

## 2021-08-26 NOTE — Patient Outreach (Signed)
  Medicaid Managed Care   Unsuccessful Attempt Note   08/26/2021 Name: Tamara [name] MRN: [id number] DOB: 05-25-1960  Referred by: Lucious Groves, DO Reason for referral : High Risk Managed Medicaid (Unsuccessful RNCM follow up telephone outreach)   An unsuccessful telephone outreach was attempted today. The patient was referred to the case management team for assistance with care management and care coordination.    Follow Up Plan: A HIPAA compliant phone message was left for the patient providing contact information and requesting a return call.    [name] RN, BSN [place]  [employer] manager

## 2021-08-26 NOTE — Patient Instructions (Signed)
Visit Information  [name]  - as a part of your Medicaid benefit, you are eligible for care management and care coordination services at no cost or copay. I was unable to reach you by phone today but would be happy to help you with your health related needs. Please feel free to call me @ [phone].   A member of the Managed Medicaid care management team will reach out to you again over the next 14 days.   [name] RN, BSN [place]  [employer] manager

## 2021-08-26 NOTE — Telephone Encounter (Signed)
fluticasone (FLONASE) 50 MCG/ACT nasal spray, refill request @ [facility] [address], [place] - [address]

## 2021-08-29 ENCOUNTER — Other Ambulatory Visit: Payer: Self-pay | Admitting: Podiatry

## 2021-08-29 DIAGNOSIS — M778 Other enthesopathies, not elsewhere classified: Secondary | ICD-10-CM

## 2021-08-29 MED ORDER — FLUTICASONE PROPIONATE 50 MCG/ACT NA SUSP: 2.0000 | Freq: Every day | NASAL | 2 refills | Status: DC

## 2021-08-31 ENCOUNTER — Telehealth: Payer: Self-pay | Admitting: *Deleted

## 2021-08-31 ENCOUNTER — Non-Acute Institutional Stay (HOSPITAL_COMMUNITY)
Admission: RE | Admit: 2021-08-31 | Discharge: 2021-08-31 | Disposition: A | Discharge status: Home / Self Care | Payer: Medicaid Other | Source: Ambulatory Visit | Attending: Internal Medicine | Admitting: Internal Medicine

## 2021-08-31 DIAGNOSIS — G35 Multiple sclerosis: Secondary | ICD-10-CM | POA: Insufficient documentation

## 2021-08-31 MED ORDER — SODIUM CHLORIDE 0.9 % IV SOLN
300.0000 mg | INTRAVENOUS | Status: DC
  Administered 2021-08-31: 300 mg via INTRAVENOUS
  Filled 2021-08-31: qty 15

## 2021-08-31 MED ORDER — SODIUM CHLORIDE 0.9 % IV SOLN: INTRAVENOUS | Status: DC | PRN

## 2021-08-31 NOTE — Progress Notes (Signed)
PATIENT CARE CENTER NOTE:   Diagnosis: Multiple Sclerosis G35      Provider: Arlice Colt, MD     Procedure: Fritzi Mandes '300mg'$   infusion     Note: Patient received Tysabri infusion via PIV (dose #10 of 12). No  premedications required per orders. Pt states she had shingles 1 month ago approx. , she has had more severe MS symptoms since then, she reports aching joints and hesitation with gait. Roberts Gaudy RN for provider notified, per Dr. Felecia Shelling administer tysabri today and have pt call neurologist office to schedule follow up for Nov. 2023. Pt made aware, verbalized understanding. Tolerated infusion well with no adverse reaction. Vital signs stable. Pt declined AVS. Patient opted to stay for 15 minutes post infusion observation, declined to stay for full hour. Pt instructed to schedule monthly infusion at front desk prior to leaving, verbalized understanding.  Alert, oriented and ambulatory at discharge.

## 2021-08-31 NOTE — Telephone Encounter (Signed)
Late entry:  Received following message from [place] at 12:06: "Hello! This pt is here for her tysabri, she says she had shingles around a month ago, since then she has felt like her joints are more achy and she said she has hesitation when she begins to walk. No falls. She just says since then her MS symptoms have been worsened. I think it is fine to give her tysabri, just want to run it by you guys"  Per Dr. Felecia Shelling, relayed ok to proceed with Tysabri infusion. Asked they ask pt to call our office at her earliest convenience to make f/u appt that is due around 12/03/21.

## 2021-09-11 ENCOUNTER — Other Ambulatory Visit: Payer: Self-pay | Admitting: Neurology

## 2021-09-19 ENCOUNTER — Telehealth: Payer: Self-pay | Admitting: *Deleted

## 2021-09-19 NOTE — Patient Outreach (Signed)
Care Coordination  09/19/2021  Tamara [name] 1961/01/12 [id number]  RNCM attempting to return call to patient. [name] left a message requesting a return call, patient with dental questions about a [place] trac request form. RNCM unable to leave a message, patient's mailbox was full.  [name] RN, BSN [place]  [employer] manager

## 2021-09-28 ENCOUNTER — Encounter (HOSPITAL_COMMUNITY): Payer: Medicaid Other

## 2021-09-29 ENCOUNTER — Non-Acute Institutional Stay (HOSPITAL_COMMUNITY)
Admission: RE | Admit: 2021-09-29 | Discharge: 2021-09-29 | Disposition: A | Discharge status: Home / Self Care | Payer: Medicaid Other | Source: Ambulatory Visit | Attending: Internal Medicine | Admitting: Internal Medicine

## 2021-09-29 DIAGNOSIS — G35 Multiple sclerosis: Secondary | ICD-10-CM | POA: Insufficient documentation

## 2021-09-29 MED ORDER — SODIUM CHLORIDE 0.9 % IV SOLN
INTRAVENOUS | Status: DC | PRN
Start: 2021-09-29 — End: 2021-09-30

## 2021-09-29 MED ORDER — SODIUM CHLORIDE 0.9 % IV SOLN
300.0000 mg | INTRAVENOUS | Status: DC
  Administered 2021-09-29: 300 mg via INTRAVENOUS
  Filled 2021-09-29: qty 15

## 2021-09-29 NOTE — Progress Notes (Signed)
PATIENT CARE CENTER NOTE     Diagnosis: Multiple Sclerosis G35     Provider: Arlice Colt, MD     Procedure: Tysabri infusion      Note: Patient received Tysabri infusion (dose 11 of 12) via PIV. Tolerated well but reported some bilateral hand cramps during infusion.  Patient observed for 1 hour post-infusion. Hand cramps resolved post infusion. Vital signs stable. AVS offered but patient refused. Patient will schedule next appointment at the front desk. Alert, oriented and ambulatory at discharge.

## 2021-10-27 ENCOUNTER — Non-Acute Institutional Stay (HOSPITAL_COMMUNITY)
Admission: RE | Admit: 2021-10-27 | Discharge: 2021-10-27 | Disposition: A | Discharge status: Home / Self Care | Payer: Medicaid Other | Source: Ambulatory Visit | Attending: Internal Medicine | Admitting: Internal Medicine

## 2021-10-27 DIAGNOSIS — G35 Multiple sclerosis: Secondary | ICD-10-CM | POA: Diagnosis present

## 2021-10-27 MED ORDER — SODIUM CHLORIDE 0.9 % IV SOLN
INTRAVENOUS | Status: DC | PRN
Start: 2021-10-27 — End: 2021-10-28

## 2021-10-27 MED ORDER — SODIUM CHLORIDE 0.9 % IV SOLN
300.0000 mg | INTRAVENOUS | Status: DC
  Administered 2021-10-27: 300 mg via INTRAVENOUS
  Filled 2021-10-27: qty 15

## 2021-10-27 NOTE — Progress Notes (Signed)
PATIENT CARE CENTER NOTE  Diagnosis: Multiple Sclerosis G35     Provider: Arlice Colt, MD     Procedure: Tysabri infusion      Note: Patient received Tysabri infusion (dose #12 of 12) via PIV. Tolerated infusion with no issues. Patient observed for 1 hour post-infusion. Vital signs stable. Upcoming appointment schedule given to pt .Alert, oriented and ambulatory at discharge.

## 2021-11-01 ENCOUNTER — Telehealth: Payer: Self-pay | Admitting: *Deleted

## 2021-11-01 NOTE — Progress Notes (Deleted)
No chief complaint on file.   HISTORY OF PRESENT ILLNESS:  11/01/21 ALL:  Tamara [name] is a 61 y.o. female here today for follow up for RRMS. She continues Tysabri infusions. Labs have been stable. Last JCV 0.19, negative. MRI brian ordered 05/2021 but has not been performed.    Lacosamide '150mg'$  BID  Baclofen '10mg'$  TID Tizanidine 2-'4mg'$  TID  Topiramate '50mg'$  TID  Tramadol '50mg'$  BID PRN  Phentermine   HISTORY (copied from Dr Garth Bigness previous note)  Tamara [name] Is a 61 y.o. woman with relapsing remitting multiple sclerosis in May 2018 after she presented with transverse myelitis.    Update 06/02/2021: She is on Tysabri.   She is tolerating the infusions well  JCV Ab 09/09/2020 was 0.17 negative.  She has no exacerbation but does note more cramping and stiffness of her hands.    She takes baclofen and it helps the spasticity but die to sleepiness s.e. she just takes rarely on bad days.     She is noting a lot of electric like pain in the left arm > leg and numbness in the left arm.  The pain lasts 5 - 15 seconds and she has moderate pain the next 5 minutes that gradually resolves.    These occur 5 tines a day.     She has allodynia in the left arm and hand.  She notes hand symptoms more when she makes a fist or squeezes.   She takes tramadol pain is worse.      I added lacosamide but she feels it is not helping much.    She could not tolerate gabapentin, Lyrica, lamotrigine and oxcarbazepine and carbamazapine in the past   She had ankle swelling with nortriptyline.  NCV/EMG was fine in the past so sensory symptoms likely from spinal cord plaques.      She has reduced gait and balance.     She is on topiramate for headaches with benefit.   She has fatigue.  She still takes phentermine    She has had a vit D deficiency and takes D3.   She has lost 9 pounds ad is trying to eat better.      MS History:   On 06/10/16, she had the onset of numbness and clumsiness in her legs.   As the day  went on, her hands also became numb and clumsy.  She felt numb from her waist down.    She went to the [facility] and had an xtay of her back and was referred to orthopedics     Two days later, she went to the ED when her symptoms worsened and she was seen and discharged.    She saw her internist who ordered a lumbar MRI.  The lumbar MRI showed degenerative changes that were mild at L4-L5 and the study was otherwise fairly normal.   She was referred to the ED and had an MRI of the brain, cervical spine and thoracic spine.    She was found to have a transverse myelitis in the cervical spine with an enhancing lesion in the posterior columns. There was also a smaller non-enhancing focus at C5   She also had 5-6 spots in her brain, the largest being periventricular on the right.  She was admitted to [facility] and had 3 days of IV Solumedrol followed by an oral taper.    She was transferred to rehabilitation and was discharged 5/30.   She walked out and  felt much better.   Additionally, while she was in the hospital (06/15/2016) she had a lumbar puncture. The CSF was abnormal showing showing the presence  4 oligoclonal bands and an elevated IgG index of 0.9 (less than 0.7 is normal). The myelin basic protein was mildly elevated at 2.2.      She had another exacerbation after returning home with left leg dragging and feeling more off balance with stumbling.   In retrospect, in 2017, she had an episode lasting a few weeks where she felt she was dragging her left leg some. This completely resolved and she did not have any other symptoms such as numbness at that time.   MRIs of the brain, cervical spine, thoracic spine and lumbar spine were personally reviewed. The MRI of the cervical spine shows an enhancing lesion posterior spinal cord adjacent to C2-C3. Additionally there is a nonenhancing focus adjacent to C5. In the brain, there are several T2/FLAIR hyperintense foci and the largest is in the right  parietal lobe and there are 4 or 5 other small foci, one in the periventricular white matter of the right frontal lobe and the rest in the subcortical white matter.    Laboratory studies show 4 oligoclonal bands and an elevated IgG index of 0.9. Myelin basic protein was mildly elevated at 2.2. She had elevated glucose between 120 and 140 several times while getting steroids.   Her Vit D was mildly low (25.6) and she just started OTC supplementation.   She started Tysabri in July 2018   IMAGING  MRIs of the brain and cervical spine 07/16/2019 compared to the 2019 brain and 2018 cervical spine .  There are no new lesions. She has old cervical spine lesions at C3 and C5.  The brain shows multiple chronic foci in the hemispheres.  None in brainstem or cerebellum.  No focus enhances.     MRI cervical spie 09/24/2020 showed Stable chronic demyelinating plaques at C3 and C5 levels   REVIEW OF SYSTEMS: Out of a complete 14 system review of symptoms, the patient complains only of the following symptoms, and all other reviewed systems are negative.   ALLERGIES: Allergies  Allergen Reactions   Codeine Other (See Comments)    Delusions   Dilaudid [Hydromorphone Hcl] Swelling and Other (See Comments)    Tongue swells    Ditropan [Oxybutynin] Other (See Comments)    Burning sensation   Other Swelling and Other (See Comments)    Unnamed gel or antiseptic solution- Was applied to IV site with a needle- Turned the skin "black and blue" that remained (caused burning and phlebitis, also)   Paxil [Paroxetine Hcl] Other (See Comments)    Hallucinations and heavy periods    Tegretol [Carbamazepine] Itching   Trileptal [Oxcarbazepine] Swelling   Adhesive [Tape] Hives, Itching, Rash and Other (See Comments)    PAPER TAPE   Augmentin [Amoxicillin-Pot Clavulanate] Rash   Gabapentin Rash and Other (See Comments)    Hallucinations and depression, also     HOME MEDICATIONS: Outpatient Medications Prior to  Visit  Medication Sig Dispense Refill   acetaminophen (TYLENOL) 650 MG CR tablet Take 1,300 mg by mouth 3 (three) times daily as needed for pain.     ammonium lactate (AMLACTIN) 12 % lotion Apply 1 Application topically as needed for dry skin. 400 g 0   baclofen (LIORESAL) 10 MG tablet Take 10 mg by mouth 3 (three) times daily as needed for muscle spasms.     Cholecalciferol (VITAMIN  D3) 50 MCG (2000 UT) TABS Take by mouth.     ciclopirox (PENLAC) 8 % solution Apply topically at bedtime. Apply over nail and surrounding skin. Apply daily over previous coat. After seven (7) days, may remove with alcohol and continue cycle. 6.6 mL 2   Coenzyme Q10-Fish Oil-Vit E (CO-Q 10 OMEGA-3 FISH OIL PO) Take 1 capsule by mouth daily.     COLLAGEN PO Take 1 Dose by mouth every other day. Powder; alternate with pill form.     COLLAGEN PO Take 3 tablets by mouth every other day. Pill; alternate with powder form.     Disposable Gloves (NITRILE GLOVES MEDIUM) MISC Use prn for bowel incontinence 100 each 5   fluticasone (FLONASE) 50 MCG/ACT nasal spray Place 2 sprays into both nostrils daily. 16 g 2   hyoscyamine (LEVSIN) 0.125 MG tablet Take 1 tablet (0.125 mg total) by mouth 3 (three) times daily. (Patient not taking: Reported on 06/22/2021) 90 tablet 5   Lacosamide 150 MG TABS One po bid (Patient not taking: Reported on 07/27/2021) 60 tablet 5   Lactobacillus (ACIDOPHILUS/PECTIN PO) Take by mouth.     loratadine (CLARITIN) 10 MG tablet Take 10 mg by mouth daily as needed for allergies or rhinitis.     LORazepam (ATIVAN) 1 MG tablet Take 1 tablet (1 mg total) by mouth once as needed for up to 1 dose for sedation (before MRI). 2 tablet 0   MINOXIDIL PO Take by mouth. (Patient not taking: Reported on 06/22/2021)     Multiple Vitamins-Minerals (HAIR/SKIN/NAILS) CAPS Take 1 capsule by mouth in the morning, at noon, and at bedtime.     Multiple Vitamins-Minerals (ONE-A-DAY WOMENS PO) Take 1 tablet by mouth daily.      natalizumab (TYSABRI) 300 MG/15ML injection Inject 300 mg into the vein every 28 (twenty-eight) days.     pantoprazole (PROTONIX) 40 MG tablet Take 1 tablet (40 mg total) by mouth daily. 90 tablet 1   phentermine 37.5 MG capsule Take 1 capsule by mouth once daily in the morning 30 capsule 5   Thiamine HCl (VITAMIN B-1 PO) Take by mouth.     tiZANidine (ZANAFLEX) 4 MG tablet 1/2 to 1 pill po tid 90 tablet 5   topiramate (TOPAMAX) 50 MG tablet TAKE 1 TABLET BY MOUTH THREE TIMES DAILY 90 tablet 2   traMADol (ULTRAM) 50 MG tablet TAKE 1 TABLET BY MOUTH TWICE DAILY AS NEEDED FOR SEVERE PAIN (Patient not taking: Reported on 06/22/2021) 60 tablet 0   VITAMIN D PO Take 5,000 Units by mouth daily. (Patient not taking: Reported on 06/22/2021)     Zinc 50 MG TABS Take by mouth.     No facility-administered medications prior to visit.     PAST MEDICAL HISTORY: Past Medical History:  Diagnosis Date   Anemia    Bowel obstruction (HCC)    CKD (chronic kidney disease), stage II    based on labs   GERD (gastroesophageal reflux disease)    IBS (irritable bowel syndrome)    at age of 65   Multiple sclerosis ([place])    RBBB    Sciatica 2009   Vision abnormalities      PAST SURGICAL HISTORY: Past Surgical History:  Procedure Laterality Date   ABDOMINAL HYSTERECTOMY  2008   cervix and right ovary still intact   KNEE ARTHROSCOPY  2010 and 2011   Left knee, x2     FAMILY HISTORY: Family History  Problem Relation Age of Onset  Cancer Father        Gallbladder   Gallbladder disease Father    Hypertension Brother    Gallbladder disease Paternal Grandmother    Colon cancer Neg Hx    Colon polyps Neg Hx    Esophageal cancer Neg Hx    Kidney disease Neg Hx      SOCIAL HISTORY: Social History   Socioeconomic History   Marital status: Widowed    Spouse name: Not on file   Number of children: 2   Years of education: Not on file   Highest education level: Not on file  Occupational History    Occupation: Armed forces technical officer  Tobacco Use   Smoking status: Never   Smokeless tobacco: Never  Vaping Use   Vaping Use: Never used  Substance and Sexual Activity   Alcohol use: No    Alcohol/week: 0.0 standard drinks of alcohol   Drug use: No   Sexual activity: Not on file  Other Topics Concern   Not on file  Social History Narrative   Not on file   Social Determinants of Health   Financial Resource Strain: Not on file  Food Insecurity: No Food Insecurity (07/27/2021)   Hunger Vital Sign    Worried About Running Out of Food in the Last Year: Never true    Ran Out of Food in the Last Year: Never true  Transportation Needs: No Transportation Needs (07/27/2021)   PRAPARE - Hydrologist (Medical): No    Lack of Transportation (Non-Medical): No  Physical Activity: Inactive (01/21/2021)   Exercise Vital Sign    Days of Exercise per Week: 0 days    Minutes of Exercise per Session: 0 min  Stress: Not on file  Social Connections: Not on file  Intimate Partner Violence: Not on file     PHYSICAL EXAM  There were no vitals filed for this visit. There is no height or weight on file to calculate BMI.  Generalized: Well developed, in no acute distress  Cardiology: normal rate and rhythm, no murmur auscultated  Respiratory: clear to auscultation bilaterally    Neurological examination  Mentation: Alert oriented to time, place, history taking. Follows all commands speech and language fluent Cranial nerve II-XII: Pupils were equal round reactive to light. Extraocular movements were full, visual field were full on confrontational test. Facial sensation and strength were normal. Uvula tongue midline. Head turning and shoulder shrug  were normal and symmetric. Motor: The motor testing reveals 5 over 5 strength of all 4 extremities. Good symmetric motor tone is noted throughout.  Sensory: Sensory testing is intact to soft touch on all 4 extremities. No  evidence of extinction is noted.  Coordination: Cerebellar testing reveals good finger-nose-finger and heel-to-shin bilaterally.  Gait and station: Gait is normal. Tandem gait is normal. Romberg is negative. No drift is seen.  Reflexes: Deep tendon reflexes are symmetric and normal bilaterally.    DIAGNOSTIC DATA (LABS, IMAGING, TESTING) - I reviewed patient records, labs, notes, testing and imaging myself where available.  Lab Results  Component Value Date   WBC 7.2 06/02/2021   HGB 11.0 (L) 06/02/2021   HCT 33.0 (L) 06/02/2021   MCV 88 06/02/2021   PLT 170 06/02/2021      Component Value Date/Time   NA 144 03/31/2021 1058   K 4.2 03/31/2021 1058   CL 109 (H) 03/31/2021 1058   CO2 23 03/31/2021 1058   GLUCOSE 89 03/31/2021 1058   GLUCOSE 94 09/07/2020  1405   BUN 15 03/31/2021 1058   CREATININE 0.94 03/31/2021 1058   CREATININE 1.01 07/08/2013 1440   CALCIUM 9.4 03/31/2021 1058   PROT 6.5 03/31/2021 1058   ALBUMIN 4.5 03/31/2021 1058   AST 13 03/31/2021 1058   ALT 15 03/31/2021 1058   ALKPHOS 72 03/31/2021 1058   BILITOT <0.2 03/31/2021 1058   GFRNONAA >60 09/07/2020 1405   GFRNONAA 64 07/08/2013 1440   GFRAA >60 07/10/2019 0519   GFRAA 74 07/08/2013 1440   Lab Results  Component Value Date   CHOL 200 (H) 03/31/2021   HDL 66 03/31/2021   LDLCALC 123 (H) 03/31/2021   TRIG 63 03/31/2021   CHOLHDL 3.0 03/31/2021   Lab Results  Component Value Date   HGBA1C 5.9 (H) 03/31/2021   Lab Results  Component Value Date   VITAMINB12 1,059 03/22/2020   Lab Results  Component Value Date   TSH 0.351 06/15/2016        No data to display               No data to display           ASSESSMENT AND PLAN  60 y.o. year old female  has a past medical history of Anemia, Bowel obstruction ([place]), CKD (chronic kidney disease), stage II, GERD (gastroesophageal reflux disease), IBS (irritable bowel syndrome), Multiple sclerosis ([place]), RBBB, Sciatica (2009), and Vision  abnormalities. here with    No diagnosis found.  Dorann Ou Witt ***.  Healthy lifestyle habits encouraged. *** will follow up with PCP as directed. *** will return to see me in ***, sooner if needed. *** verbalizes understanding and agreement with this plan.   No orders of the defined types were placed in this encounter.    No orders of the defined types were placed in this encounter.    Debbora Presto, MSN, FNP-C 11/01/2021, 4:51 PM  [facility] [address], [place], [place] [address] [phone]

## 2021-11-02 ENCOUNTER — Encounter: Payer: Self-pay | Admitting: Family Medicine

## 2021-11-02 ENCOUNTER — Ambulatory Visit: Payer: Medicaid Other | Admitting: Family Medicine

## 2021-11-02 DIAGNOSIS — G35 Multiple sclerosis: Secondary | ICD-10-CM

## 2021-11-02 DIAGNOSIS — Z79899 Other long term (current) drug therapy: Secondary | ICD-10-CM

## 2021-11-02 NOTE — Progress Notes (Deleted)
No chief complaint on file.   HISTORY OF PRESENT ILLNESS:  11/02/21 ALL:  [name] is a 61 y.o. female here today for follow up for RRMS. She continues Tysabri infusions. Labs have been stable. Last JCV 0.19, negative. MRI brian ordered 05/2021 but has not been performed.    Lacosamide '150mg'$  BID  Baclofen '10mg'$  TID Tizanidine 2-'4mg'$  TID  Topiramate '50mg'$  TID  Tramadol '50mg'$  BID PRN  Phentermine   HISTORY (copied from Dr Garth Bigness previous note)  Tamara [name] Is a 61 y.o. woman with relapsing remitting multiple sclerosis in May 2018 after she presented with transverse myelitis.    Update 06/02/2021: She is on Tysabri.   She is tolerating the infusions well  JCV Ab 09/09/2020 was 0.17 negative.  She has no exacerbation but does note more cramping and stiffness of her hands.    She takes baclofen and it helps the spasticity but die to sleepiness s.e. she just takes rarely on bad days.     She is noting a lot of electric like pain in the left arm > leg and numbness in the left arm.  The pain lasts 5 - 15 seconds and she has moderate pain the next 5 minutes that gradually resolves.    These occur 5 tines a day.     She has allodynia in the left arm and hand.  She notes hand symptoms more when she makes a fist or squeezes.   She takes tramadol pain is worse.      I added lacosamide but she feels it is not helping much.    She could not tolerate gabapentin, Lyrica, lamotrigine and oxcarbazepine and carbamazapine in the past   She had ankle swelling with nortriptyline.  NCV/EMG was fine in the past so sensory symptoms likely from spinal cord plaques.      She has reduced gait and balance.     She is on topiramate for headaches with benefit.   She has fatigue.  She still takes phentermine    She has had a vit D deficiency and takes D3.   She has lost 9 pounds ad is trying to eat better.      MS History:   On 06/10/16, she had the onset of numbness and clumsiness in her legs.   As the day  went on, her hands also became numb and clumsy.  She felt numb from her waist down.    She went to the [facility] and had an xtay of her back and was referred to orthopedics     Two days later, she went to the ED when her symptoms worsened and she was seen and discharged.    She saw her internist who ordered a lumbar MRI.  The lumbar MRI showed degenerative changes that were mild at L4-L5 and the study was otherwise fairly normal.   She was referred to the ED and had an MRI of the brain, cervical spine and thoracic spine.    She was found to have a transverse myelitis in the cervical spine with an enhancing lesion in the posterior columns. There was also a smaller non-enhancing focus at C5   She also had 5-6 spots in her brain, the largest being periventricular on the right.  She was admitted to [facility] and had 3 days of IV Solumedrol followed by an oral taper.    She was transferred to rehabilitation and was discharged 5/30.   She walked out and  felt much better.   Additionally, while she was in the hospital (06/15/2016) she had a lumbar puncture. The CSF was abnormal showing showing the presence  4 oligoclonal bands and an elevated IgG index of 0.9 (less than 0.7 is normal). The myelin basic protein was mildly elevated at 2.2.      She had another exacerbation after returning home with left leg dragging and feeling more off balance with stumbling.   In retrospect, in 2017, she had an episode lasting a few weeks where she felt she was dragging her left leg some. This completely resolved and she did not have any other symptoms such as numbness at that time.   MRIs of the brain, cervical spine, thoracic spine and lumbar spine were personally reviewed. The MRI of the cervical spine shows an enhancing lesion posterior spinal cord adjacent to C2-C3. Additionally there is a nonenhancing focus adjacent to C5. In the brain, there are several T2/FLAIR hyperintense foci and the largest is in the right  parietal lobe and there are 4 or 5 other small foci, one in the periventricular white matter of the right frontal lobe and the rest in the subcortical white matter.    Laboratory studies show 4 oligoclonal bands and an elevated IgG index of 0.9. Myelin basic protein was mildly elevated at 2.2. She had elevated glucose between 120 and 140 several times while getting steroids.   Her Vit D was mildly low (25.6) and she just started OTC supplementation.   She started Tysabri in July 2018   IMAGING  MRIs of the brain and cervical spine 07/16/2019 compared to the 2019 brain and 2018 cervical spine .  There are no new lesions. She has old cervical spine lesions at C3 and C5.  The brain shows multiple chronic foci in the hemispheres.  None in brainstem or cerebellum.  No focus enhances.     MRI cervical spie 09/24/2020 showed Stable chronic demyelinating plaques at C3 and C5 levels   REVIEW OF SYSTEMS: Out of a complete 14 system review of symptoms, the patient complains only of the following symptoms, and all other reviewed systems are negative.   ALLERGIES: Allergies  Allergen Reactions   Codeine Other (See Comments)    Delusions   Dilaudid [Hydromorphone Hcl] Swelling and Other (See Comments)    Tongue swells    Ditropan [Oxybutynin] Other (See Comments)    Burning sensation   Other Swelling and Other (See Comments)    Unnamed gel or antiseptic solution- Was applied to IV site with a needle- Turned the skin "black and blue" that remained (caused burning and phlebitis, also)   Paxil [Paroxetine Hcl] Other (See Comments)    Hallucinations and heavy periods    Tegretol [Carbamazepine] Itching   Trileptal [Oxcarbazepine] Swelling   Adhesive [Tape] Hives, Itching, Rash and Other (See Comments)    PAPER TAPE   Augmentin [Amoxicillin-Pot Clavulanate] Rash   Gabapentin Rash and Other (See Comments)    Hallucinations and depression, also     HOME MEDICATIONS: Outpatient Medications Prior to  Visit  Medication Sig Dispense Refill   acetaminophen (TYLENOL) 650 MG CR tablet Take 1,300 mg by mouth 3 (three) times daily as needed for pain.     ammonium lactate (AMLACTIN) 12 % lotion Apply 1 Application topically as needed for dry skin. 400 g 0   baclofen (LIORESAL) 10 MG tablet Take 10 mg by mouth 3 (three) times daily as needed for muscle spasms.     Cholecalciferol (VITAMIN  D3) 50 MCG (2000 UT) TABS Take by mouth.     ciclopirox (PENLAC) 8 % solution Apply topically at bedtime. Apply over nail and surrounding skin. Apply daily over previous coat. After seven (7) days, may remove with alcohol and continue cycle. 6.6 mL 2   Coenzyme Q10-Fish Oil-Vit E (CO-Q 10 OMEGA-3 FISH OIL PO) Take 1 capsule by mouth daily.     COLLAGEN PO Take 1 Dose by mouth every other day. Powder; alternate with pill form.     COLLAGEN PO Take 3 tablets by mouth every other day. Pill; alternate with powder form.     Disposable Gloves (NITRILE GLOVES MEDIUM) MISC Use prn for bowel incontinence 100 each 5   fluticasone (FLONASE) 50 MCG/ACT nasal spray Place 2 sprays into both nostrils daily. 16 g 2   hyoscyamine (LEVSIN) 0.125 MG tablet Take 1 tablet (0.125 mg total) by mouth 3 (three) times daily. (Patient not taking: Reported on 06/22/2021) 90 tablet 5   Lacosamide 150 MG TABS One po bid (Patient not taking: Reported on 07/27/2021) 60 tablet 5   Lactobacillus (ACIDOPHILUS/PECTIN PO) Take by mouth.     loratadine (CLARITIN) 10 MG tablet Take 10 mg by mouth daily as needed for allergies or rhinitis.     LORazepam (ATIVAN) 1 MG tablet Take 1 tablet (1 mg total) by mouth once as needed for up to 1 dose for sedation (before MRI). 2 tablet 0   MINOXIDIL PO Take by mouth. (Patient not taking: Reported on 06/22/2021)     Multiple Vitamins-Minerals (HAIR/SKIN/NAILS) CAPS Take 1 capsule by mouth in the morning, at noon, and at bedtime.     Multiple Vitamins-Minerals (ONE-A-DAY WOMENS PO) Take 1 tablet by mouth daily.      natalizumab (TYSABRI) 300 MG/15ML injection Inject 300 mg into the vein every 28 (twenty-eight) days.     pantoprazole (PROTONIX) 40 MG tablet Take 1 tablet (40 mg total) by mouth daily. 90 tablet 1   phentermine 37.5 MG capsule Take 1 capsule by mouth once daily in the morning 30 capsule 5   Thiamine HCl (VITAMIN B-1 PO) Take by mouth.     tiZANidine (ZANAFLEX) 4 MG tablet 1/2 to 1 pill po tid 90 tablet 5   topiramate (TOPAMAX) 50 MG tablet TAKE 1 TABLET BY MOUTH THREE TIMES DAILY 90 tablet 2   traMADol (ULTRAM) 50 MG tablet TAKE 1 TABLET BY MOUTH TWICE DAILY AS NEEDED FOR SEVERE PAIN (Patient not taking: Reported on 06/22/2021) 60 tablet 0   VITAMIN D PO Take 5,000 Units by mouth daily. (Patient not taking: Reported on 06/22/2021)     Zinc 50 MG TABS Take by mouth.     No facility-administered medications prior to visit.     PAST MEDICAL HISTORY: Past Medical History:  Diagnosis Date   Anemia    Bowel obstruction (HCC)    CKD (chronic kidney disease), stage II    based on labs   GERD (gastroesophageal reflux disease)    IBS (irritable bowel syndrome)    at age of 30   Multiple sclerosis ([place])    RBBB    Sciatica 2009   Vision abnormalities      PAST SURGICAL HISTORY: Past Surgical History:  Procedure Laterality Date   ABDOMINAL HYSTERECTOMY  2008   cervix and right ovary still intact   KNEE ARTHROSCOPY  2010 and 2011   Left knee, x2     FAMILY HISTORY: Family History  Problem Relation Age of Onset  Cancer Father        Gallbladder   Gallbladder disease Father    Hypertension Brother    Gallbladder disease Paternal Grandmother    Colon cancer Neg Hx    Colon polyps Neg Hx    Esophageal cancer Neg Hx    Kidney disease Neg Hx      SOCIAL HISTORY: Social History   Socioeconomic History   Marital status: Widowed    Spouse name: Not on file   Number of children: 2   Years of education: Not on file   Highest education level: Not on file  Occupational History    Occupation: Armed forces technical officer  Tobacco Use   Smoking status: Never   Smokeless tobacco: Never  Vaping Use   Vaping Use: Never used  Substance and Sexual Activity   Alcohol use: No    Alcohol/week: 0.0 standard drinks of alcohol   Drug use: No   Sexual activity: Not on file  Other Topics Concern   Not on file  Social History Narrative   Not on file   Social Determinants of Health   Financial Resource Strain: Not on file  Food Insecurity: No Food Insecurity (07/27/2021)   Hunger Vital Sign    Worried About Running Out of Food in the Last Year: Never true    Ran Out of Food in the Last Year: Never true  Transportation Needs: No Transportation Needs (07/27/2021)   PRAPARE - Hydrologist (Medical): No    Lack of Transportation (Non-Medical): No  Physical Activity: Inactive (01/21/2021)   Exercise Vital Sign    Days of Exercise per Week: 0 days    Minutes of Exercise per Session: 0 min  Stress: Not on file  Social Connections: Not on file  Intimate Partner Violence: Not on file     PHYSICAL EXAM  There were no vitals filed for this visit. There is no height or weight on file to calculate BMI.  Generalized: Well developed, in no acute distress  Cardiology: normal rate and rhythm, no murmur auscultated  Respiratory: clear to auscultation bilaterally    Neurological examination  Mentation: Alert oriented to time, place, history taking. Follows all commands speech and language fluent Cranial nerve II-XII: Pupils were equal round reactive to light. Extraocular movements were full, visual field were full on confrontational test. Facial sensation and strength were normal. Uvula tongue midline. Head turning and shoulder shrug  were normal and symmetric. Motor: The motor testing reveals 5 over 5 strength of all 4 extremities. Good symmetric motor tone is noted throughout.  Sensory: Sensory testing is intact to soft touch on all 4 extremities. No  evidence of extinction is noted.  Coordination: Cerebellar testing reveals good finger-nose-finger and heel-to-shin bilaterally.  Gait and station: Gait is normal. Tandem gait is normal. Romberg is negative. No drift is seen.  Reflexes: Deep tendon reflexes are symmetric and normal bilaterally.    DIAGNOSTIC DATA (LABS, IMAGING, TESTING) - I reviewed patient records, labs, notes, testing and imaging myself where available.  Lab Results  Component Value Date   WBC 7.2 06/02/2021   HGB 11.0 (L) 06/02/2021   HCT 33.0 (L) 06/02/2021   MCV 88 06/02/2021   PLT 170 06/02/2021      Component Value Date/Time   NA 144 03/31/2021 1058   K 4.2 03/31/2021 1058   CL 109 (H) 03/31/2021 1058   CO2 23 03/31/2021 1058   GLUCOSE 89 03/31/2021 1058   GLUCOSE 94 09/07/2020  1405   BUN 15 03/31/2021 1058   CREATININE 0.94 03/31/2021 1058   CREATININE 1.01 07/08/2013 1440   CALCIUM 9.4 03/31/2021 1058   PROT 6.5 03/31/2021 1058   ALBUMIN 4.5 03/31/2021 1058   AST 13 03/31/2021 1058   ALT 15 03/31/2021 1058   ALKPHOS 72 03/31/2021 1058   BILITOT <0.2 03/31/2021 1058   GFRNONAA >60 09/07/2020 1405   GFRNONAA 64 07/08/2013 1440   GFRAA >60 07/10/2019 0519   GFRAA 74 07/08/2013 1440   Lab Results  Component Value Date   CHOL 200 (H) 03/31/2021   HDL 66 03/31/2021   LDLCALC 123 (H) 03/31/2021   TRIG 63 03/31/2021   CHOLHDL 3.0 03/31/2021   Lab Results  Component Value Date   HGBA1C 5.9 (H) 03/31/2021   Lab Results  Component Value Date   VITAMINB12 1,059 03/22/2020   Lab Results  Component Value Date   TSH 0.351 06/15/2016        No data to display               No data to display           ASSESSMENT AND PLAN  61 y.o. year old female  has a past medical history of Anemia, Bowel obstruction ([place]), CKD (chronic kidney disease), stage II, GERD (gastroesophageal reflux disease), IBS (irritable bowel syndrome), Multiple sclerosis ([place]), RBBB, Sciatica (2009), and Vision  abnormalities. here with    No diagnosis found.  Tamara [name] ***.  Healthy lifestyle habits encouraged. *** will follow up with PCP as directed. *** will return to see me in ***, sooner if needed. *** verbalizes understanding and agreement with this plan.   No orders of the defined types were placed in this encounter.    No orders of the defined types were placed in this encounter.    Debbora Presto, MSN, FNP-C 11/02/2021, 4:21 PM  [facility] [address] [place], [place] [address] [phone]

## 2021-11-07 ENCOUNTER — Ambulatory Visit: Payer: Medicaid Other | Admitting: Family Medicine

## 2021-11-07 ENCOUNTER — Encounter: Payer: Self-pay | Admitting: Family Medicine

## 2021-11-07 ENCOUNTER — Telehealth: Payer: Self-pay | Admitting: Family Medicine

## 2021-11-07 DIAGNOSIS — Z79899 Other long term (current) drug therapy: Secondary | ICD-10-CM

## 2021-11-07 DIAGNOSIS — G35 Multiple sclerosis: Secondary | ICD-10-CM

## 2021-11-07 NOTE — Telephone Encounter (Signed)
Refills should be on file at pharmacy, pt notified.

## 2021-11-07 NOTE — Telephone Encounter (Signed)
Pt states she needs a refill of topiramate (TOPAMAX) 50 MG tablet & phentermine 37.5 MG capsule at [facility].

## 2021-11-08 ENCOUNTER — Other Ambulatory Visit: Payer: Self-pay

## 2021-11-08 NOTE — Patient Outreach (Addendum)
Medicaid Managed Care Social Work Note  11/08/2021 Name:  Tamara [name] MRN:  [id number] DOB:  1960/04/26  Tamara [name] is an 60 y.o. year old female who is a primary patient of Lucious Groves, DO.  The [facility] Managed Care Coordination team was consulted for assistance with:   dental resources  Ms. Clabo was given information about Medicaid Managed Care Coordination team services today. [place] Patient agreed to services and verbal consent obtained.  Engaged with patient  for by telephone forfollow up visit in response to referral for case management and/or care coordination services.   Assessments/Interventions:  Review of past medical history, allergies, medications, health status, including review of consultants reports, laboratory and other test data, was performed as part of comprehensive evaluation and provision of chronic care management services.  SDOH: (Social Determinant of Health) assessments and interventions performed: SDOH Interventions    Flowsheet Row Office Visit from 08/04/2021 in [place] Patient Outreach Telephone from 07/27/2021 in [place] Patient Outreach Telephone from 01/21/2021 in [place] Coordination  SDOH Interventions     Food Insecurity Interventions -- Intervention Not Indicated --  Housing Interventions -- -- Intervention Not Indicated  Transportation Interventions -- Intervention Not Indicated Intervention Not Indicated  Depression Interventions/Treatment  Currently on Treatment -- --  Physical Activity Interventions -- -- Other (Comments)  [she needs a home exercise machine to keep her mobile - she will check with benefits at her managed Medicaid health plan]     BSW completed a telephone outreach with patient. She stated she is needing some assistance with dental care. She states she has a lot of work that needs to be done, due to her having MS the  work she has to get done is going to cost about 30,000. Patient states her Medicaid stated they would pay a portion of the work and would pay for her to get dentures, patient states she is unable to get dentures, she needs something permanent. Patient states she cannot go back to the dentist she was going to [facility] because they do not understand her MS. Patient stated she would like to go to a dental school in [facility], that does everything. BSW researched and was able to locate Dr. Mable Paris, BSW contacted his clinic and they stated they do not accept Medicaid, the clinic that Dr. Mable Paris has is only once a year and they had the one for 15 in May/June.  Advanced Directives Status:  Not addressed in this encounter.  Care Plan                 Allergies  Allergen Reactions   Codeine Other (See Comments)    Delusions   Dilaudid [Hydromorphone Hcl] Swelling and Other (See Comments)    Tongue swells    Ditropan [Oxybutynin] Other (See Comments)    Burning sensation   Other Swelling and Other (See Comments)    Unnamed gel or antiseptic solution- Was applied to IV site with a needle- Turned the skin "black and blue" that remained (caused burning and phlebitis, also)   Paxil [Paroxetine Hcl] Other (See Comments)    Hallucinations and heavy periods    Tegretol [Carbamazepine] Itching   Trileptal [Oxcarbazepine] Swelling   Adhesive [Tape] Hives, Itching, Rash and Other (See Comments)    PAPER TAPE   Augmentin [Amoxicillin-Pot Clavulanate] Rash   Gabapentin Rash and Other (See Comments)    Hallucinations  and depression, also    Medications Reviewed Today     Reviewed by Luiz Blare, RN (Registered Nurse) on 07/28/21 at [place] List Status: <None>   Medication Order Taking? Sig Documenting Provider Last Dose Status Informant  acetaminophen (TYLENOL) 650 MG CR tablet [id number]  Take 1,300 mg by mouth 3 (three) times daily as needed for pain. [provider]  Active  Self           Med Note Duffy Bruce, Legrand Como   Thu Jul 10, 2019  4:59 PM) Patient stated she does not "mix" Tylenol with Tramadol  baclofen (LIORESAL) 10 MG tablet [id number]  Take 10 mg by mouth 3 (three) times daily as needed for muscle spasms. [provider]  Active Self           Med Note Broadus John, Trude Mcburney   Fri Jan 21, 2021 10:02 AM) Dewaine Conger rarely   Cholecalciferol (VITAMIN D3) 50 MCG (2000 UT) TABS [id number]  Take by mouth. [provider]  Active   Coenzyme Q10-Fish Oil-Vit E (CO-Q 10 OMEGA-3 FISH OIL PO) [id number]  Take 1 capsule by mouth daily. [provider]  Active Self  COLLAGEN PO [id number]  Take 1 Dose by mouth every other day. Powder; alternate with pill form. [provider]  Active Self  COLLAGEN PO [id number]  Take 3 tablets by mouth every other day. Pill; alternate with powder form. [provider]  Active Self  Disposable Gloves (NITRILE GLOVES MEDIUM) MISC [id number]  Use prn for bowel incontinence Sater, Nanine Means, MD  Active Self           Med Note Broadus John, Trude Mcburney   Fri Jan 21, 2021  9:47 AM) 5 M brand gloves  fluticasone (FLONASE) 50 MCG/ACT nasal spray [id number]  Place 2 sprays into both nostrils daily.  Patient taking differently: Place 2 sprays into both nostrils as needed for allergies.   Maudie Mercury, MD  Active   hyoscyamine (LEVSIN) 0.125 MG tablet [id number]  Take 1 tablet (0.125 mg total) by mouth 3 (three) times daily.  Patient not taking: Reported on 06/22/2021   Britt Bottom, MD  Active            Med Note (ROBB, MELANIE A   Wed Jul 27, 2021 11:04 AM) Has not taken since last week, due to drowsiness  Lacosamide 150 MG TABS [id number]  One po bid  Patient not taking: Reported on 07/27/2021   Britt Bottom, MD  Active            Med Note (ROBB, MELANIE A   Wed Jul 27, 2021 11:04 AM) Has not taken since last week, due to drowsiness  Lactobacillus (ACIDOPHILUS/PECTIN PO) [id number]  Take by mouth. [provider]  Active   loratadine (CLARITIN) 10 MG tablet [id number]  Take 10 mg by mouth daily as needed for allergies or rhinitis. [provider]  Active Self  MINOXIDIL PO [id number]  Take by mouth.  Patient not taking: Reported on 06/22/2021   [provider]  Active   Multiple Vitamins-Minerals (HAIR/SKIN/NAILS) CAPS [id number]  Take 1 capsule by mouth in the morning, at noon, and at bedtime. [provider]  Active Self  Multiple Vitamins-Minerals (ONE-A-DAY WOMENS PO) [id number]  Take 1 tablet by mouth daily. [provider]  Active Self  natalizumab (TYSABRI) 300 MG/15ML injection [id number]  Inject 300 mg into the vein every 28 (twenty-eight) days. [provider]  Active Self  Med Note Ronnald Ramp, EMMA L   Tue Aug 05, 2019 10:33 AM)  Received fax notification that pt is re-auth for Tysabri via touch program 08/05/19-02/25/20. Account: [employer]. Site auth number: [id number]. Pt enrollment number: [id number].  pantoprazole (PROTONIX) 40 MG tablet [id number]  Take 1 tablet (40 mg total) by mouth daily. Charise Killian, MD  Active   phentermine 37.5 MG capsule [id number]  Take 1 capsule by mouth once daily in the morning Dohmeier, Asencion Partridge, MD  Active   Thiamine HCl (VITAMIN B-1 PO) [id number]  Take by mouth. [provider]  Active   tiZANidine (ZANAFLEX) 4 MG tablet [id number]  1/2 to 1 pill po tid Britt Bottom, MD  Active   topiramate (TOPAMAX) 50 MG tablet [id number]  TAKE 1 TABLET BY MOUTH THREE TIMES DAILY Sater, Nanine Means, MD  Active            Med Note Broadus John, Trude Mcburney   Fri Jan 21, 2021  9:56 AM) Dewaine Conger for energy and to prevent seizures    traMADol (ULTRAM) 50 MG tablet [id number]  TAKE 1 TABLET BY MOUTH TWICE DAILY AS NEEDED FOR SEVERE PAIN  Patient not taking: Reported on 06/22/2021   Britt Bottom, MD  Active            Med Note Broadus John, Trude Mcburney   Fri Jan 21, 2021  9:54 AM) Dewaine Conger rarely and only for severe  pain and does not take with Tylenol  VITAMIN D PO [id number]  Take 5,000 Units by mouth daily.  Patient not taking: Reported on 06/22/2021   [provider]  Active   Zinc 50 MG TABS [id number]  Take by mouth. [provider]  Active             Patient Active Problem List   Diagnosis Date Noted   Herpes zoster without complication 25/63/8937   Alopecia 08/04/2021   Foot pain, bilateral 08/04/2021   Prediabetes 05/25/2021   Vitamin D deficiency 06/14/2020   B12 deficiency 03/22/2020   Left-sided chest wall pain 12/18/2019   Spasticity 08/18/2019   Sleep apnea 07/18/2019   Bilateral hand numbness 01/14/2019   RLQ abdominal pain 10/22/2018   Pedal edema 06/04/2018   Gait disturbance 06/04/2018   Bilateral knee pain 01/07/2018   Conjunctivitis 10/30/2017   Dental caries 07/30/2017   Dyspnea on exertion 07/30/2017   Class 1 obesity with body mass index (BMI) of 32.0 to 32.9 in adult 12/18/2016   Dysesthesia 10/11/2016   High risk medication use 07/04/2016   Multiple sclerosis ([place]) 06/19/2016   Neurogenic bowel    Neurogenic bladder    Allergic rhinitis 04/12/2016   Sciatica associated with disorder of lumbosacral spine 01/15/2014   GERD (gastroesophageal reflux disease) 07/21/2013   RBBB on EKG 08/01/2012   IBS (irritable bowel syndrome) 06/19/2012   Healthcare maintenance 06/19/2012    Conditions to be addressed/monitored per PCP order:   dental resources  There are no care plans that you recently modified to display for this patient.   Follow up:  Patient agrees to Care Plan and Follow-up.  Plan: The Managed Medicaid care management team will reach out to the patient again over the next 30 days.  Date/time of next scheduled Social Work care management/care coordination outreach:  12/09/21  [name], Arita Miss, [place] Medicaid Team  [phone]

## 2021-11-08 NOTE — Patient Instructions (Signed)
Visit Information  Tamara [name] was given information about Medicaid Managed Care team care coordination services as a part of their [place] Medicaid benefit. Tamara [name] verbally consented to engagement with the [facility] Managed Care team.   If you are experiencing a medical emergency, please call 911 or report to your local emergency department or urgent care.   If you have a non-emergency medical problem during routine business hours, please contact your provider's office and ask to speak with a nurse.   For questions related to your [facility], please call: [phone] or visit the homepage here: [email or web address]  If you would like to schedule transportation through your [facility], please call the following number at least 2 days in advance of your appointment: [phone]   Rides for urgent appointments can also be made after hours by calling Member Services.  Call the [place] at [phone], at any time, 24 hours a day, 7 days a week. If you are in danger or need immediate medical attention call 911.  If you would like help to quit smoking, call 1-800-QUIT-NOW 618 634 5344) OR Espaol: 1-855-Djelo-Ya (1-655-374-8270) o para ms informacin haga clic aqu or Text READY to 200-400 to register via text  [name] - following are the goals we discussed in your visit today:   Goals Addressed   None       Social Worker will follow up in 30 days .   [name], BSW, [place] Managed Medicaid Team  [phone]   Following is a copy of your plan of care:  There are no care plans that you recently modified to display for this patient.

## 2021-11-16 ENCOUNTER — Other Ambulatory Visit: Payer: Self-pay | Admitting: *Deleted

## 2021-11-16 NOTE — Patient Outreach (Signed)
Medicaid Managed Care   Nurse Care Manager Note  11/16/2021 Name:  Tamara [name] MRN:  [id number] DOB:  [birth year]  Tamara [name] is an 61 y.o. year old female who is a primary patient of [name], DO.  The [facility] Managed Care Coordination team was consulted for assistance with:    HLD Weight loss Prediabetes  [name] was given information about Medicaid Managed Care Coordination team services today. Tamara [name] Patient agreed to services and verbal consent obtained.  Engaged with patient by telephone for follow up visit in response to provider referral for case management and/or care coordination services.   Assessments/Interventions:  Review of past medical history, allergies, medications, health status, including review of consultants reports, laboratory and other test data, was performed as part of comprehensive evaluation and provision of chronic care management services.  SDOH (Social Determinants of Health) assessments and interventions performed: SDOH Interventions    Flowsheet Row Office Visit from 08/04/2021 in [facility] Patient Outreach Telephone from 07/27/2021 in [facility] Care Coordination Patient Outreach Telephone from 01/21/2021 in [facility] Care Coordination  SDOH Interventions     Food Insecurity Interventions -- Intervention Not Indicated --  Housing Interventions -- -- Intervention Not Indicated  Transportation Interventions -- Intervention Not Indicated Intervention Not Indicated  Depression Interventions/Treatment  Currently on Treatment -- --  Physical Activity Interventions -- -- Other (Comments)  [she needs a home exercise machine to keep her mobile - she will check with benefits at her managed Medicaid health plan]       Care Plan  Allergies  Allergen Reactions   Codeine Other (See Comments)    Delusions   Dilaudid [Hydromorphone Hcl] Swelling and Other (See Comments)     Tongue swells    Ditropan [Oxybutynin] Other (See Comments)    Burning sensation   Other Swelling and Other (See Comments)    Unnamed gel or antiseptic solution- Was applied to IV site with a needle- Turned the skin "black and blue" that remained (caused burning and phlebitis, also)   Paxil [Paroxetine Hcl] Other (See Comments)    Hallucinations and heavy periods    Tegretol [Carbamazepine] Itching   Trileptal [Oxcarbazepine] Swelling   Adhesive [Tape] Hives, Itching, Rash and Other (See Comments)    PAPER TAPE   Augmentin [Amoxicillin-Pot Clavulanate] Rash   Gabapentin Rash and Other (See Comments)    Hallucinations and depression, also    Medications Reviewed Today     Reviewed by Heidi Dach, RN (Registered Nurse) on 11/16/21 at 1355  Med List Status: <None>   Medication Order Taking? Sig Documenting Provider Last Dose Status Informant  acetaminophen (TYLENOL) 650 MG CR tablet [id number] Yes Take 1,300 mg by mouth 3 (three) times daily as needed for pain. [provider] Taking Active Self           Med Note Antony Madura, Harlin Rain Jul 10, 2019  4:59 PM) Patient stated she does not "mix" Tylenol with Tramadol  ammonium lactate (AMLACTIN) 12 % lotion [id number] Yes Apply 1 Application topically as needed for dry skin. Vivi Barrack, DPM Taking Active   baclofen (LIORESAL) 10 MG tablet [id number] Yes Take 10 mg by mouth 3 (three) times daily as needed for muscle spasms. [provider] Taking Active Self           Med Note Alben Deeds, Earlyne Iba   Fri Jan 21, 2021 10:02 AM) Takes rarely  Cholecalciferol (VITAMIN D3) 50 MCG (2000 UT) TABS [id number] Yes Take by mouth. [provider] Taking Active   ciclopirox (PENLAC) 8 % solution [id number] Yes Apply topically at bedtime. Apply over nail and surrounding skin. Apply daily over previous coat. After seven (7) days, may remove with alcohol and continue cycle. Vivi Barrack, DPM Taking Active   Coenzyme  Q10-Fish Oil-Vit E (CO-Q 10 OMEGA-3 FISH OIL PO) [id number] Yes Take 1 capsule by mouth daily. [provider] Taking Active Self  COLLAGEN PO [id number] Yes Take 1 Dose by mouth every other day. Powder; alternate with pill form. [provider] Taking Active Self  COLLAGEN PO [id number] Yes Take 3 tablets by mouth every other day. Pill; alternate with powder form. [provider] Taking Active Self  Disposable Gloves (NITRILE GLOVES MEDIUM) MISC [id number]  Use prn for bowel incontinence Sater, Pearletha Furl, MD  Active Self           Med Note Alben Deeds, Earlyne Iba   Fri Jan 21, 2021  9:47 AM) 5 M brand gloves  fluticasone (FLONASE) 50 MCG/ACT nasal spray [id number] Yes Place 2 sprays into both nostrils daily. [name], DO Taking Active   hyoscyamine (LEVSIN) 0.125 MG tablet [id number] Yes Take 1 tablet (0.125 mg total) by mouth 3 (three) times daily. Sater, Pearletha Furl, MD Taking Active            Med Note (Agapito Hanway A   Wed Jul 27, 2021 11:04 AM) Has not taken since last week, due to drowsiness  ibuprofen (ADVIL) 200 MG tablet [id number] Yes Take 200 mg by mouth every 6 (six) hours as needed. [provider] Taking Active   Lacosamide 150 MG TABS [id number] Yes One po bid Asa Lente, MD Taking Active            Med Note (Dung Prien A   Wed Jul 27, 2021 11:04 AM) Has not taken since last week, due to drowsiness  Lactobacillus (ACIDOPHILUS/PECTIN PO) [id number] Yes Take by mouth. [provider] Taking Active   loratadine (CLARITIN) 10 MG tablet [id number] Yes Take 10 mg by mouth daily as needed for allergies or rhinitis. [provider] Taking Active Self  LORazepam (ATIVAN) 1 MG tablet [id number]  Take 1 tablet (1 mg total) by mouth once as needed for up to 1 dose for sedation (before MRI). [name], DO  Active            Med Note (Gelena Klosinski A   Wed Nov 16, 2021  1:51 PM) Will take prior to MRI  magnesium citrate SOLN [id number] Yes  Take 1 Bottle by mouth once. [provider] Taking Active   MINOXIDIL PO [id number] No Take by mouth.  Patient not taking: Reported on 06/22/2021   [provider] Not Taking Active   Multiple Vitamins-Minerals (HAIR/SKIN/NAILS) CAPS [id number] Yes Take 1 capsule by mouth in the morning, at noon, and at bedtime. [provider] Taking Active Self  Multiple Vitamins-Minerals (ONE-A-DAY WOMENS PO) [id number] Yes Take 1 tablet by mouth daily. [provider] Taking Active Self  natalizumab (TYSABRI) 300 MG/15ML injection [id number] Yes Inject 300 mg into the vein every 28 (twenty-eight) days. [provider] Taking Active Self           Med Note Yetta Barre, EMMA L   Tue Aug 05, 2019 10:33 AM)  Received fax notification that pt is re-auth for Tysabri via touch program 08/05/19-02/25/20. Account: [facility].  Site auth number: [id number]. Pt enrollment number: [id number].  pantoprazole (PROTONIX) 40 MG tablet [id number] Yes Take 1 tablet (40 mg total) by mouth daily. Dickie La, MD Taking Active   phentermine 37.5 MG capsule [id number] Yes Take 1 capsule by mouth once daily in the morning Dohmeier, Porfirio Mylar, MD Taking Active   Thiamine HCl (VITAMIN B-1 PO) [id number] Yes Take by mouth. [provider] Taking Active   tiZANidine (ZANAFLEX) 4 MG tablet [id number] Yes 1/2 to 1 pill po tid Asa Lente, MD Taking Active   topiramate (TOPAMAX) 50 MG tablet [id number] Yes TAKE 1 TABLET BY MOUTH THREE TIMES DAILY Sater, Pearletha Furl, MD Taking Active   traMADol (ULTRAM) 50 MG tablet [id number] No TAKE 1 TABLET BY MOUTH TWICE DAILY AS NEEDED FOR SEVERE PAIN  Patient not taking: Reported on 06/22/2021   Asa Lente, MD Not Taking Consider Medication Status and Discontinue (Patient Preference)            Med Note Alben Deeds, Earlyne Iba   Fri Jan 21, 2021  9:54 AM) Leanora Ivanoff rarely and only for severe pain and does not take with Tylenol  VITAMIN D PO [id number]  No Take 5,000 Units by mouth daily.  Patient not taking: Reported on 06/22/2021   [provider] Not Taking Active   Zinc 50 MG TABS [id number] Yes Take by mouth. [provider] Taking Active             Patient Active Problem List   Diagnosis Date Noted   Herpes zoster without complication 08/04/2021   Alopecia 08/04/2021   Foot pain, bilateral 08/04/2021   Prediabetes 05/25/2021   Vitamin D deficiency 06/14/2020   B12 deficiency 03/22/2020   Left-sided chest wall pain 12/18/2019   Spasticity 08/18/2019   Sleep apnea 07/18/2019   Bilateral hand numbness 01/14/2019   RLQ abdominal pain 10/22/2018   Pedal edema 06/04/2018   Gait disturbance 06/04/2018   Bilateral knee pain 01/07/2018   Conjunctivitis 10/30/2017   Dental caries 07/30/2017   Dyspnea on exertion 07/30/2017   Class 1 obesity with body mass index (BMI) of 32.0 to 32.9 in adult 12/18/2016   Dysesthesia 10/11/2016   High risk medication use 07/04/2016   Multiple sclerosis (HCC) 06/19/2016   Neurogenic bowel    Neurogenic bladder    Allergic rhinitis 04/12/2016   Sciatica associated with disorder of lumbosacral spine 01/15/2014   GERD (gastroesophageal reflux disease) 07/21/2013   RBBB on EKG 08/01/2012   IBS (irritable bowel syndrome) 06/19/2012   Healthcare maintenance 06/19/2012    Conditions to be addressed/monitored per PCP order:  HLD and weight loss and Prediabetes  Care Plan : RN Care Manager Plan Of Care  Updates made by Heidi Dach, RN since 11/16/2021 12:00 AM     Problem: Knowledge Deficit and Care Coordination Needs Related to Management of Multiple Sclerosis   Priority: High     Long-Range Goal: Development of Plan Of Care to Address Care Coordination Needs and Knowledge Deficits for Management of Multiple Sclerosis, prediabetes and HLD   Start Date: 01/21/2021  Expected End Date: 01/21/2022  Priority: High  Note:   Current Barriers:  Knowledge Deficits related to  plan of care for management of Multiple Sclerosis, prediabetes and HLD  Care Coordination needs related to Lacks knowledge of community resource: family psychiatric evaluation to assist with securing custody of granddaughter [name]  Ms. Olden continues to work on weight loss with exercise and diet. She would like to get down to #  155.    RNCM Clinical Goal(s):  Patient will demonstrate ongoing adherence to prescribed treatment plan for multiple sclerosis as evidenced by no inpatient hospitalizations or ED visits due to MS exacerbation or patient reports of MS symptom exacerbation through collaboration with RN Care manager, provider, and care team.   Interventions: Inter-disciplinary care team collaboration (see longitudinal plan of care) Evaluation of current treatment plan related to  self management and patient's adherence to plan as established by provider Provided therapeutic listening   Weight Loss Interventions:  (Status:  Goal on track:  Yes.) Long Term Goal- exercising daily, cardio 15 min each day Advised patient to discuss with primary care provider options regarding weight management Reviewed recommended dietary changes: avoid fad diets, make small/incremental dietary and exercise changes, eat at the table and avoid eating in front of the TV, plan management of cravings, monitor snacking and cravings in food diary   Discussed the weight loss goal of #155   Hyperlipidemia Interventions:  (Status:  Goal on track:  Yes.) Long Term Goal- Patient is exercising daily and has changed her diet Medication review performed; medication list updated in electronic medical record.  Counseled on importance of regular laboratory monitoring as prescribedEnsured patient received educational information on HLD mailed to her     Prediabetes  (Status:  Goal on track:  Yes.)  Long Term Goal- Evaluation of current treatment plan related to  prediabetes ,  self-management and patient's adherence to plan as  established by provider. Discussed plans with patient for ongoing care management follow up and provided patient with direct contact information for care management team Discussed the importance of weight loss, healthy diet, and exercise  Health Maintenance Interventions:( Goal on track: met) Long Term Goal  (Status:  Condition stable.  Not addressed this visit.) Long Term Goal Patient interviewed about adult health maintenance status including  Colonoscopy    Regular Dental Care       Patient Goals/Self-Care Activities: Take medications as prescribed   Attend all scheduled provider appointments Call pharmacy for medication refills 3-7 days in advance of running out of medications Perform all self care activities independently  Perform IADL's (shopping, preparing meals, housekeeping, managing finances) independently Call provider office for new concerns or questions  Call health plan member services to discuss benefits details specific to patient's needs Schedule colonoscopy         Follow Up:  Patient agrees to Care Plan and Follow-up.  Plan: The Managed Medicaid care management team will reach out to the patient again over the next 60 days.  Date/time of next scheduled RN care management/care coordination outreach:  01/16/22 @ 9am  [name] RN, BSN   [facility]

## 2021-11-16 NOTE — Patient Instructions (Signed)
Visit Information  Ms. Snellings was given information about Medicaid Managed Care team care coordination services as a part of their [place] Medicaid benefit. Alitzel Cookson Marchesi verbally consented to engagement with the [facility] Managed Care team.   If you are experiencing a medical emergency, please call 911 or report to your local emergency department or urgent care.   If you have a non-emergency medical problem during routine business hours, please contact your provider's office and ask to speak with a nurse.   For questions related to your [facility], please call: [phone] or visit the homepage here: [email or web address]  If you would like to schedule transportation through your [facility], please call the following number at least 2 days in advance of your appointment: [phone]   Rides for urgent appointments can also be made after hours by calling Member Services.  Call the [place] at [phone], at any time, 24 hours a day, 7 days a week. If you are in danger or need immediate medical attention call 911.  If you would like help to quit smoking, call 1-800-QUIT-NOW 808-376-2519) OR Espaol: 1-855-Djelo-Ya (1-324-401-0272) o para ms informacin haga clic aqu or Text READY to 200-400 to register via text  Ms. Mcgruder,   Please see education materials related to weight loss provided by MyChart link.  Patient verbalizes understanding of instructions and care plan provided today and agrees to view in [place]. Active MyChart status and patient understanding of how to access instructions and care plan via MyChart confirmed with patient.     Telephone follow up appointment with Managed Medicaid care management team member scheduled for:01/16/22 @ [place] RN, BSN [place] RN Care  Coordinator   Following is a copy of your plan of care:  Care Plan : [place]  Updates made by Melissa Montane, RN since 11/16/2021 12:00 AM     Problem: Knowledge Deficit and Care Coordination Needs Related to Management of Multiple Sclerosis   Priority: High     Long-Range Goal: Development of Plan Of Care to Address Care Coordination Needs and Knowledge Deficits for Management of Multiple Sclerosis, prediabetes and HLD   Start Date: 01/21/2021  Expected End Date: 01/21/2022  Priority: High  Note:   Current Barriers:  Knowledge Deficits related to plan of care for management of Multiple Sclerosis, prediabetes and HLD  Care Coordination needs related to Lacks knowledge of community resource: family psychiatric evaluation to assist with securing custody of granddaughter [name]  Ms. Corl continues to work on weight loss with exercise and diet. She would like to get down to #155.    RNCM Clinical Goal(s):  Patient will demonstrate ongoing adherence to prescribed treatment plan for multiple sclerosis as evidenced by no inpatient hospitalizations or ED visits due to MS exacerbation or patient reports of MS symptom exacerbation through collaboration with RN Care manager, provider, and care team.   Interventions: Inter-disciplinary care team collaboration (see longitudinal plan of care) Evaluation of current treatment plan related to  self management and patient's adherence to plan as established by provider Provided therapeutic listening   Weight Loss Interventions:  (Status:  Goal on track:  Yes.) Long Term Goal- exercising daily, cardio 15 min each day Advised patient to discuss with primary care provider options regarding weight management Reviewed recommended dietary changes: avoid fad diets, make small/incremental dietary and exercise changes, eat at the table  and avoid eating in front of the TV, plan management of cravings, monitor snacking and cravings in food  diary   Discussed the weight loss goal of #155   Hyperlipidemia Interventions:  (Status:  Goal on track:  Yes.) Long Term Goal- Patient is exercising daily and has changed her diet Medication review performed; medication list updated in electronic medical record.  Counseled on importance of regular laboratory monitoring as prescribedEnsured patient received educational information on HLD mailed to her     Prediabetes  (Status:  Goal on track:  Yes.)  Long Term Goal- Evaluation of current treatment plan related to  prediabetes ,  self-management and patient's adherence to plan as established by provider. Discussed plans with patient for ongoing care management follow up and provided patient with direct contact information for care management team Discussed the importance of weight loss, healthy diet, and exercise  Health Maintenance Interventions:( Goal on track: met) Long Term Goal  (Status:  Condition stable.  Not addressed this visit.) Long Term Goal Patient interviewed about adult health maintenance status including  Colonoscopy    Regular Dental Care       Patient Goals/Self-Care Activities: Take medications as prescribed   Attend all scheduled provider appointments Call pharmacy for medication refills 3-7 days in advance of running out of medications Perform all self care activities independently  Perform IADL's (shopping, preparing meals, housekeeping, managing finances) independently Call provider office for new concerns or questions  Call health plan member services to discuss benefits details specific to patient's needs Schedule colonoscopy

## 2021-11-17 ENCOUNTER — Encounter: Payer: Self-pay | Admitting: Neurology

## 2021-11-17 ENCOUNTER — Telehealth: Payer: Self-pay | Admitting: Adult Health

## 2021-11-17 ENCOUNTER — Ambulatory Visit (INDEPENDENT_AMBULATORY_CARE_PROVIDER_SITE_OTHER): Payer: Medicaid Other | Admitting: Neurology

## 2021-11-17 ENCOUNTER — Telehealth: Payer: Self-pay | Admitting: Neurology

## 2021-11-17 ENCOUNTER — Telehealth: Payer: Self-pay

## 2021-11-17 VITALS — BP 119/69 | HR 81 | Ht 63.5 in | Wt 185.0 lb

## 2021-11-17 DIAGNOSIS — M62838 Other muscle spasm: Secondary | ICD-10-CM | POA: Diagnosis not present

## 2021-11-17 DIAGNOSIS — R200 Anesthesia of skin: Secondary | ICD-10-CM | POA: Diagnosis not present

## 2021-11-17 DIAGNOSIS — R269 Unspecified abnormalities of gait and mobility: Secondary | ICD-10-CM

## 2021-11-17 DIAGNOSIS — E559 Vitamin D deficiency, unspecified: Secondary | ICD-10-CM | POA: Diagnosis not present

## 2021-11-17 DIAGNOSIS — G35 Multiple sclerosis: Secondary | ICD-10-CM | POA: Diagnosis not present

## 2021-11-17 DIAGNOSIS — Z79899 Other long term (current) drug therapy: Secondary | ICD-10-CM | POA: Diagnosis not present

## 2021-11-17 DIAGNOSIS — D352 Benign neoplasm of pituitary gland: Secondary | ICD-10-CM | POA: Diagnosis not present

## 2021-11-17 DIAGNOSIS — R208 Other disturbances of skin sensation: Secondary | ICD-10-CM

## 2021-11-17 MED ORDER — TOPIRAMATE 50 MG PO TABS: 50.0000 mg | ORAL_TABLET | Freq: Three times a day (TID) | ORAL | 11 refills | Status: DC

## 2021-11-17 MED ORDER — IMIPRAMINE HCL 25 MG PO TABS: 25.0000 mg | ORAL_TABLET | Freq: Every day | ORAL | 11 refills | Status: DC

## 2021-11-17 NOTE — Telephone Encounter (Addendum)
Patient has Architect to speak to a Freight forwarder.  I went up to the front patient asked to step off to the side.  I brought her back to the lab waiting area asked her if she was okay talking in this area and she said yes.  She proceeded to tell me that she was upset that a staff member said that she was cursing on the phone.  She stated that she was "a [place] lady" and she does not curse.  I explained that there were 2 staff members that heard her cursing as she was speaking very loudly on the phone.  Nevertheless I did not hear the conversation to verify that she was using curse words.  I did advise that the issue our office has is that she consistently shows up late for appointments.  She states that we do not understand her condition and need to be educated.  The patient was speaking very loudly to me.  Dr. Leonie Man walked by at the time and heard how loud she was. I was not able to talk as he repeatedly kept interrupting. She stated that she wanted to file a complaint with [facility] for patient rights.  She then asked me for my number to complain through cone.  I stepped away and got her the number to patient experience.  When I stepped back to give her the number she was speaking with Dr. Felecia Shelling.  She accused our office and myself of being racist.  Since the patient was speaking with Dr. Felecia Shelling I left the conversation

## 2021-11-17 NOTE — Telephone Encounter (Signed)
I received a call from this patient advising that she needed to speak with Terrence Dupont, Dr Rexene Alberts nurse. She advised that she needed to speak with her because she was having some difficulties this morning and may be late to her appointment. I spoke with Marin Olp and was advised to offer a video visit appointment or a telephone appointment if the patient was going to be later than 11:35. Per the patient, she needed to see her doctor in person. I again reiterated that office policy was that she needed to be checked in for her appointment no later than 5 minutes after or the appointment would need to be rescheduled. The patient stated this was the third time our office "tried to pull this" on her and that she needed to see her doctor. Again and multiple times after, the patient was advised of office policy and the need to arrive 15 minutes early for her appointment. Patient became irate after being told office policy again, and curse words were spoken. I advised the patient that I did not appreciate the way she was speaking to me, and that if she continued, the call would be disconnected. I advised that all I could do if she was going to be late to her appointment, was to make the provider aware but that I could not guarantee that he would see her today.   Patient advised she was on her way and disconnected the call.

## 2021-11-17 NOTE — Progress Notes (Signed)
[facility]  PATIENT: Tamara [name] DOB: [birth year]  REFERRING DOCTOR OR PCP:  Nadean Corwin SOURCE: Patient, notes from emergency room and recent hospital stay ([facility]), imaging and lab reports, MRI images on PACS.  _________________________________   HISTORICAL  CHIEF COMPLAINT:  Chief Complaint  Patient presents with   Follow-up    Pt alone, rm 1. Here for MS follow up pt is on tysabri. Last infusion was at patient care center 08/31/21. Pt thinks she still may be having side effects from shingles that she had.     HISTORY OF PRESENT ILLNESS:  Tamara [name] Is a 61 y.o. woman with relapsing remitting multiple sclerosis in May 2018 after she presented with transverse myelitis.   Update 11/17/2021: She is on Tysabri.   She is tolerating the infusions well  JCV Ab 06/02/2021 was 0.19 negative.  She has no exacerbation but does note more cramping and stiffness of her hands.   Tizanidine works better for her than baclofen.  It causes mild sleepiness  She is scheduled for an MRI  She has reduced gait and balance.  She is noting a lot of electric like pain in the left arm > leg and numbness in the left arm.  The pain lasts 5 - 15 seconds and she has moderate pain the next 5 minutes that gradually resolves.    These occur 5 tines a day.     She has allodynia in the left arm and hand.  She notes hand symptoms more when she makes a fist or squeezes.   She takes tramadol pain is worse. Lacosamide has not helped.    She could not tolerate gabapentin, Lyrica, imipramine, lamotrigine and oxcarbazepine and carbamazapine in the past   She had ankle swelling with nortriptyline.  NCV/EMG was fine in the past so sensory symptoms likely from spinal cord plaques.     She had shingles in June 2023 in the right face/chin to near the ear (C2 most likely)..   She was placed on an antiviral and lidocaine ointment.  She still has dysesthesias nt helped by the lidocaine (also on lacosamide) Besides the  skin hurting, she had more headaches.    She had not done the vaccine.      She is on topiramate for headaches with benefit.     She has fatigue.  She still takes phentermine    She has had a vit D deficiency and takes D3.     She also has a pituitary adenoma that has been stable on MR  Her husband passed away 2021/03/30.      MS History:   On 06/10/16, she had the onset of numbness and clumsiness in her legs.   As the day went on, her hands also became numb and clumsy.  She felt numb from her waist down.    She went to the [facility] and had an xtay of her back and was referred to orthopedics     Two days later, she went to the ED when her symptoms worsened and she was seen and discharged.    She saw her internist who ordered a lumbar MRI.  The lumbar MRI showed degenerative changes that were mild at L4-L5 and the study was otherwise fairly normal.   She was referred to the ED and had an MRI of the brain, cervical spine and thoracic spine.    She was found to have a transverse myelitis in the cervical spine with an  enhancing lesion in the posterior columns. There was also a smaller non-enhancing focus at C5   She also had 5-6 spots in her brain, the largest being periventricular on the right.  She was admitted to [facility] and had 3 days of IV Solumedrol followed by an oral taper.    She was transferred to rehabilitation and was discharged 5/30.   She walked out and felt much better.   Additionally, while she was in the hospital (06/15/2016) she had a lumbar puncture. The CSF was abnormal showing showing the presence  4 oligoclonal bands and an elevated IgG index of 0.9 (less than 0.7 is normal). The myelin basic protein was mildly elevated at 2.2.      She had another exacerbation after returning home with left leg dragging and feeling more off balance with stumbling.   In retrospect, in 2017, she had an episode lasting a few weeks where she felt she was dragging her left leg some. This  completely resolved and she did not have any other symptoms such as numbness at that time.   MRIs of the brain, cervical spine, thoracic spine and lumbar spine were personally reviewed. The MRI of the cervical spine shows an enhancing lesion posterior spinal cord adjacent to C2-C3. Additionally there is a nonenhancing focus adjacent to C5. In the brain, there are several T2/FLAIR hyperintense foci and the largest is in the right parietal lobe and there are 4 or 5 other small foci, one in the periventricular white matter of the right frontal lobe and the rest in the subcortical white matter.    Laboratory studies show 4 oligoclonal bands and an elevated IgG index of 0.9. Myelin basic protein was mildly elevated at 2.2. She had elevated glucose between 120 and 140 several times while getting steroids.   Her Vit D was mildly low (25.6) and she just started OTC supplementation.   She started Tysabri in July 2018  IMAGING  MRIs of the brain and cervical spine 07/16/2019 compared to the 2019 brain and 2018 cervical spine .  There are no new lesions. She has old cervical spine lesions at C3 and C5.  The brain shows multiple chronic foci in the hemispheres.  None in brainstem or cerebellum.  No focus enhances.    MRI cervical spie 09/24/2020 showed Stable chronic demyelinating plaques at C3 and C5 levels     REVIEW OF SYSTEMS: Constitutional: No fevers, chills, sweats, or change in appetite Eyes: No visual changes, double vision, eye pain Ear, nose and throat: No hearing loss, ear pain, nasal congestion, sore throat Cardiovascular: No chest pain, palpitations Respiratory:  No shortness of breath at rest or with exertion.   No wheezes GastrointestinaI: No nausea, vomiting, diarrhea, abdominal pain, fecal incontinence Genitourinary:  No dysuria, urinary retention or frequency.  No nocturia. Musculoskeletal:  No neck pain, back pain Integumentary: No rash, pruritus, skin lesions Neurological: as  above Psychiatric: No depression at this time.  No anxiety Endocrine: No palpitations, diaphoresis, change in appetite, change in weigh or increased thirst Hematologic/Lymphatic:  No anemia, purpura, petechiae. Allergic/Immunologic: No itchy/runny eyes, nasal congestion, recent allergic reactions, rashes  ALLERGIES: Allergies  Allergen Reactions   Codeine Other (See Comments)    Delusions   Dilaudid [Hydromorphone Hcl] Swelling and Other (See Comments)    Tongue swells    Ditropan [Oxybutynin] Other (See Comments)    Burning sensation   Other Swelling and Other (See Comments)    Unnamed gel or antiseptic solution- Was applied  to IV site with a needle- Turned the skin "black and blue" that remained (caused burning and phlebitis, also)   Paxil [Paroxetine Hcl] Other (See Comments)    Hallucinations and heavy periods    Tegretol [Carbamazepine] Itching   Trileptal [Oxcarbazepine] Swelling   Adhesive [Tape] Hives, Itching, Rash and Other (See Comments)    PAPER TAPE   Augmentin [Amoxicillin-Pot Clavulanate] Rash   Gabapentin Rash and Other (See Comments)    Hallucinations and depression, also    HOME MEDICATIONS:  Current Outpatient Medications:    acetaminophen (TYLENOL) 650 MG CR tablet, Take 1,300 mg by mouth 3 (three) times daily as needed for pain., Disp: , Rfl:    ammonium lactate (AMLACTIN) 12 % lotion, Apply 1 Application topically as needed for dry skin., Disp: 400 g, Rfl: 0   Cholecalciferol (VITAMIN D3) 50 MCG (2000 UT) TABS, Take by mouth., Disp: , Rfl:    ciclopirox (PENLAC) 8 % solution, Apply topically at bedtime. Apply over nail and surrounding skin. Apply daily over previous coat. After seven (7) days, may remove with alcohol and continue cycle., Disp: 6.6 mL, Rfl: 2   Coenzyme Q10-Fish Oil-Vit E (CO-Q 10 OMEGA-3 FISH OIL PO), Take 1 capsule by mouth daily., Disp: , Rfl:    COLLAGEN PO, Take 1 Dose by mouth every other day. Powder; alternate with pill form., Disp: ,  Rfl:    COLLAGEN PO, Take 3 tablets by mouth every other day. Pill; alternate with powder form., Disp: , Rfl:    Disposable Gloves (NITRILE GLOVES MEDIUM) MISC, Use prn for bowel incontinence, Disp: 100 each, Rfl: 5   fluticasone (FLONASE) 50 MCG/ACT nasal spray, Place 2 sprays into both nostrils daily., Disp: 16 g, Rfl: 2   hyoscyamine (LEVSIN) 0.125 MG tablet, Take 1 tablet (0.125 mg total) by mouth 3 (three) times daily., Disp: 90 tablet, Rfl: 5   ibuprofen (ADVIL) 200 MG tablet, Take 200 mg by mouth every 6 (six) hours as needed., Disp: , Rfl:    Lactobacillus (ACIDOPHILUS/PECTIN PO), Take by mouth., Disp: , Rfl:    loratadine (CLARITIN) 10 MG tablet, Take 10 mg by mouth daily as needed for allergies or rhinitis., Disp: , Rfl:    LORazepam (ATIVAN) 1 MG tablet, Take 1 tablet (1 mg total) by mouth once as needed for up to 1 dose for sedation (before MRI)., Disp: 2 tablet, Rfl: 0   magnesium citrate SOLN, Take 1 Bottle by mouth once., Disp: , Rfl:    MINOXIDIL PO, Take by mouth., Disp: , Rfl:    Multiple Vitamins-Minerals (HAIR/SKIN/NAILS) CAPS, Take 1 capsule by mouth in the morning, at noon, and at bedtime., Disp: , Rfl:    Multiple Vitamins-Minerals (ONE-A-DAY WOMENS PO), Take 1 tablet by mouth daily., Disp: , Rfl:    natalizumab (TYSABRI) 300 MG/15ML injection, Inject 300 mg into the vein every 28 (twenty-eight) days., Disp: , Rfl:    pantoprazole (PROTONIX) 40 MG tablet, Take 1 tablet (40 mg total) by mouth daily., Disp: 90 tablet, Rfl: 1   phentermine 37.5 MG capsule, Take 1 capsule by mouth once daily in the morning, Disp: 30 capsule, Rfl: 5   Thiamine HCl (VITAMIN B-1 PO), Take by mouth., Disp: , Rfl:    tiZANidine (ZANAFLEX) 4 MG tablet, 1/2 to 1 pill po tid (Patient taking differently: Take 2-4 mg by mouth 3 (three) times daily as needed for muscle spasms. 1/2 to 1 pill po tid), Disp: 90 tablet, Rfl: 5   VITAMIN D PO, Take  5,000 Units by mouth daily., Disp: , Rfl:    Zinc 50 MG TABS,  Take by mouth., Disp: , Rfl:    topiramate (TOPAMAX) 50 MG tablet, Take 1 tablet (50 mg total) by mouth 3 (three) times daily., Disp: 90 tablet, Rfl: 11  PAST MEDICAL HISTORY: Past Medical History:  Diagnosis Date   Anemia    Bowel obstruction (HCC)    CKD (chronic kidney disease), stage II    based on labs   GERD (gastroesophageal reflux disease)    IBS (irritable bowel syndrome)    at age of 38   Multiple sclerosis (HCC)    RBBB    Sciatica 2009   Vision abnormalities     PAST SURGICAL HISTORY: Past Surgical History:  Procedure Laterality Date   ABDOMINAL HYSTERECTOMY  2008   cervix and right ovary still intact   KNEE ARTHROSCOPY  2010 and 2011   Left knee, x2    FAMILY HISTORY: Family History  Problem Relation Age of Onset   Cancer Father        Gallbladder   Gallbladder disease Father    Hypertension Brother    Gallbladder disease Paternal Grandmother    Colon cancer Neg Hx    Colon polyps Neg Hx    Esophageal cancer Neg Hx    Kidney disease Neg Hx     SOCIAL HISTORY:  Social History   Socioeconomic History   Marital status: Widowed    Spouse name: Not on file   Number of children: 2   Years of education: Not on file   Highest education level: Not on file  Occupational History   Occupation: Science writer  Tobacco Use   Smoking status: Never   Smokeless tobacco: Never  Vaping Use   Vaping Use: Never used  Substance and Sexual Activity   Alcohol use: No    Alcohol/week: 0.0 standard drinks of alcohol   Drug use: No   Sexual activity: Not on file  Other Topics Concern   Not on file  Social History Narrative   Not on file   Social Determinants of Health   Financial Resource Strain: Not on file  Food Insecurity: No Food Insecurity (07/27/2021)   Hunger Vital Sign    Worried About Running Out of Food in the Last Year: Never true    Ran Out of Food in the Last Year: Never true  Transportation Needs: No Transportation Needs (07/27/2021)    PRAPARE - Administrator, Civil Service (Medical): No    Lack of Transportation (Non-Medical): No  Physical Activity: Inactive (01/21/2021)   Exercise Vital Sign    Days of Exercise per Week: 0 days    Minutes of Exercise per Session: 0 min  Stress: Not on file  Social Connections: Not on file  Intimate Partner Violence: Not on file     PHYSICAL EXAM  Vitals:   11/17/21 1218  BP: 119/69  Pulse: 81  Weight: 185 lb (83.9 kg)  Height: 5' 3.5" (1.613 m)    Body mass index is 32.26 kg/m.   General: The patient is well-developed and well-nourished and in no acute distress   Neurologic Exam  Mental status: The patient is alert and oriented x 3 at the time of the examination. The patient has apparent normal recent and remote memory, with an apparently normal attention span and concentration ability.   Speech is normal.  Cranial nerves: Extraocular movements are full. Facial strength and sensation is normal. Trapezius strength  is normal.. Hearing appears to be symmetric.  Motor:  Muscle bulk is normal.   Muscle tone is normal. Strength is 5/5 except 4+/5 hip flexion (iliopsoas).      Sensory:   She had normal sensation to vibration in the arms and legs.  She reported some allodynia in the left hand compared to the right.    Coordination: Cerebellar testing reveals good finger-nose-finger and reduced left heel-to-shin bilaterally.  Gait and station: Station is normal.  Gait is wide.  Tandem gait is poor.  Left foot drop.  Romberg is negative.    Reflexes:  . Deep tendon reflexes are 3 and symmetric in the knees and ankles and 2 and symmetric in the arms.      DIAGNOSTIC DATA (LABS, IMAGING, TESTING) - I reviewed patient records, labs, notes, testing and imaging myself where available.  Lab Results  Component Value Date   WBC 7.2 06/02/2021   HGB 11.0 (L) 06/02/2021   HCT 33.0 (L) 06/02/2021   MCV 88 06/02/2021   PLT 170 06/02/2021      Component Value  Date/Time   NA 144 03/31/2021 1058   K 4.2 03/31/2021 1058   CL 109 (H) 03/31/2021 1058   CO2 23 03/31/2021 1058   GLUCOSE 89 03/31/2021 1058   GLUCOSE 94 09/07/2020 1405   BUN 15 03/31/2021 1058   CREATININE 0.94 03/31/2021 1058   CREATININE 1.01 07/08/2013 1440   CALCIUM 9.4 03/31/2021 1058   PROT 6.5 03/31/2021 1058   ALBUMIN 4.5 03/31/2021 1058   AST 13 03/31/2021 1058   ALT 15 03/31/2021 1058   ALKPHOS 72 03/31/2021 1058   BILITOT <0.2 03/31/2021 1058   GFRNONAA >60 09/07/2020 1405   GFRNONAA 64 07/08/2013 1440   GFRAA >60 07/10/2019 0519   GFRAA 74 07/08/2013 1440   Lab Results  Component Value Date   CHOL 200 (H) 03/31/2021   HDL 66 03/31/2021   LDLCALC 123 (H) 03/31/2021   TRIG 63 03/31/2021   CHOLHDL 3.0 03/31/2021   Lab Results  Component Value Date   HGBA1C 5.9 (H) 03/31/2021   Lab Results  Component Value Date   VITAMINB12 1,059 03/22/2020   Lab Results  Component Value Date   TSH 0.351 06/15/2016       ASSESSMENT AND PLAN  Multiple sclerosis (HCC) - Plan: Stratify JCV Antibody Test ([facility]), CBC with Differential/Platelet  High risk medication use - Plan: Stratify JCV Antibody Test ([facility]), CBC with Differential/Platelet  Hand numbness  Dysesthesia  Gait disturbance  Muscle spasm  Pituitary macroadenoma (HCC)  Vitamin D deficiency - Plan: VITAMIN D 25 Hydroxy (Vit-D Deficiency, Fractures)   1.  Continue Tysabri..    Check JCV Ab today (negative 5 months ago) 2.  Since Vimpat was not helpful, she can taper off.   She will continue phentermine for fatigue/weight and Topamax for migraine 3.   Due to combination of physical and cognitive/fatigue issues, she is disabled and unable to work.  4.   Vit D supplementation.    5.   Continue tizanidine.  Continue phentermine for fatigue  Addendum:  Ms. May was late to the appointment.  She had called a couple minutes before the appointment time and we had offered to change her visit to a video  visit or to change the appointment date and time but she insisted on coming in.  Additionally, she was rude to the staff on the phone with raised voice and cussing.  This was witnessed by staff in  the same room.  I did go ahead and see her when she came in.  She was fairly pleasant during the visit with me.  However, on her way out of the office she became rude to staff again and started yelling at some of them.  This was overheard by doctors, other staff and patients.  Please see note from staff from today.  I approached her to let her know her behavior is not acceptable.  Keoni Risinger A. Epimenio Foot, MD, PhD, [place]  11/17/2021, 12:53 PM Certified in Neurology, Clinical Neurophysiology, Sleep Medicine, Pain Medicine and Neuroimaging Dir., [facility]  [facility] [address] [place], [place] [address] [phone]

## 2021-11-17 NOTE — Telephone Encounter (Signed)
Late entry: occurring at 1:30pm on 11/17/2021. I overheard patient getting loud with our [employer] manager. Hedwig Morton. stated to patient very calmly that she would be glad to give her the complaint line with [place]. Pt was very demanding with repeating her questions, after Jinny Blossom had answered them. When we returned back with the phone number for patient, we found her in the hallway and speaking with Dr. Felecia Shelling. He was trying to explain to patient that our office was being very accommodating to her to work her in during lunch time, since she has been repeatedly late for her appointments to our office. Pt then calls Clinic manager and other staff racist because she is black and the whole office is white, per patient. Dr. Felecia Shelling was trying to speak to patient about her aggressive behavior towards the office staff. Patient interrupts Dr. Felecia Shelling, repeatedly cutting him off, to state that this is clear racism, again because she is a christian black woman and there is all white people in this office. Dr. Felecia Shelling and I tell patient that she has been given the [facility] phone number that she asked for. I kindly ask patient to follow me to the exit.

## 2021-11-17 NOTE — Telephone Encounter (Signed)
Late entry: I roomed the patient as she was checked in 45 minutes late for her appointment.  While checking her in I advised that [place] working her in late today was a luxury. Advised that Dr. Garth Bigness schedule is typically pretty full and that he was making an exception to get her seen today. While in the room I completed the check-in process.  After completing the check-in I proceeded to inform the patient that in the future it would be beneficial for her if she schedules her appointment at a time that works better for her and when she feels she is usually at her best.  Advised that if morning appointments are not good for her then we should look at a afternoon appointment to better accommodate her have the capability to check in 30 minutes prior to her appointment.  Patient states that she will "try to check in on time for her next appointment".  I advised that I needed her to understand that if she shows up late for her appointment it is not always the case that we can get her seen on that same day.  I advised her that she cannot be upset or mad and curse our staff when they are not accommodate her being late. She said she "never cursed anyone, she is a christian lady so if someone said that, then they are wrong." I advised this is not necessarily what something said but that in general she needs to be aware that she can't get upset with [place] if she can't make her scheduled appointment. I informed this is why we encourage patients to show up 30 minutes prior to make sure she is checked in and ready to proceed with the visit. The patient began to get upset with me stating that she is an educated woman and does not need to be told this multiple times.  She states that we do not understand her disease. I advised her that I am very aware of her medical condition and we understand that things can come up but this is the third time in a row she had intentionally showed up late despite being educated she needed to plan to  arrive early. She proceeded to say that she cannot always get through to our phone staff and that she wasted her gas coming up here last time to not be seen.  She proceeded to state that with her medical condition she needs to be seen by her doctor especially when she is having issues. I informed her that if when she is calling us to let us know she is going to be late, she knows it is going to be past the 5 minute mark, then she shouldn't make the trip over her because more than likely we will not be able to work her in as it is our office policy if you are 5 minutes past the appointment time you are considered late and we will reschedule.  Pt continued to talk over me while I was trying to explain the office policy, I did advise I needed to step away due to not having lunch yet and Dr Felecia Shelling needed to come in. We ended up our conversation.

## 2021-11-17 NOTE — Telephone Encounter (Signed)
Placed JCV lab in [facility] lock box for routine lab pick up. Results pending. 

## 2021-11-18 LAB — CBC WITH DIFFERENTIAL/PLATELET
Basophils Absolute: 0 10*3/uL (ref 0.0–0.2)
Basos: 0 %
EOS (ABSOLUTE): 0.1 10*3/uL (ref 0.0–0.4)
Eos: 1 %
Hematocrit: 35.2 % (ref 34.0–46.6)
Hemoglobin: 11.5 g/dL (ref 11.1–15.9)
Immature Grans (Abs): 0 10*3/uL (ref 0.0–0.1)
Immature Granulocytes: 0 %
Lymphocytes Absolute: 3 10*3/uL (ref 0.7–3.1)
Lymphs: 47 %
MCH: 28.7 pg (ref 26.6–33.0)
MCHC: 32.7 g/dL (ref 31.5–35.7)
MCV: 88 fL (ref 79–97)
Monocytes Absolute: 0.5 10*3/uL (ref 0.1–0.9)
Monocytes: 8 %
NRBC: 1 % — ABNORMAL HIGH (ref 0–0)
Neutrophils Absolute: 2.8 10*3/uL (ref 1.4–7.0)
Neutrophils: 44 %
Platelets: 163 10*3/uL (ref 150–450)
RBC: 4.01 x10E6/uL (ref 3.77–5.28)
RDW: 13.8 % (ref 11.7–15.4)
WBC: 6.4 10*3/uL (ref 3.4–10.8)

## 2021-11-18 LAB — VITAMIN D 25 HYDROXY (VIT D DEFICIENCY, FRACTURES): Vit D, 25-Hydroxy: 63.1 ng/mL (ref 30.0–100.0)

## 2021-11-21 ENCOUNTER — Ambulatory Visit: Payer: Medicaid Other | Admitting: Podiatry

## 2021-11-22 ENCOUNTER — Ambulatory Visit (INDEPENDENT_AMBULATORY_CARE_PROVIDER_SITE_OTHER): Payer: Medicaid Other | Admitting: Podiatry

## 2021-11-22 DIAGNOSIS — M2142 Flat foot [pes planus] (acquired), left foot: Secondary | ICD-10-CM

## 2021-11-22 DIAGNOSIS — B351 Tinea unguium: Secondary | ICD-10-CM | POA: Diagnosis not present

## 2021-11-22 DIAGNOSIS — L600 Ingrowing nail: Secondary | ICD-10-CM

## 2021-11-22 DIAGNOSIS — M2141 Flat foot [pes planus] (acquired), right foot: Secondary | ICD-10-CM | POA: Diagnosis not present

## 2021-11-22 DIAGNOSIS — M722 Plantar fascial fibromatosis: Secondary | ICD-10-CM | POA: Diagnosis not present

## 2021-11-22 MED ORDER — CICLOPIROX 8 % EX SOLN: Freq: Every day | CUTANEOUS | 2 refills | Status: AC

## 2021-11-22 NOTE — Patient Instructions (Signed)
Address: [address], [place], [place] [address] Hours:  Open ? Closes 5?PM Phone: [phone]

## 2021-11-22 NOTE — Progress Notes (Signed)
Subjective: Chief Complaint  Patient presents with   Nail Problem    Nail fungus, patient is using Penlac,     61 year old female presents with the above concerns.  Overall she said that she is doing well she been using Penlac but she is concerned that she still has a burning sensation to the big toenails.  No skin redness or any drainage.  She is still wearing compression socks.  She also is a prescription for inserts.   Objective: AAO x3, NAD DP/PT pulses palpable bilaterally, CRT less than 3 seconds; chronic lower extremity edema present. Flatfoot present Still some yellow discoloration present to the toenail but overall color looks better.  There is incurvation present to the hallux toenails mostly on the medial worse than lateral aspects with tenderness palpation there is no edema, erythema, drainage or pus or signs of infection. No pain with calf compression, swelling, warmth, erythema  Assessment: Onychomycosis, ingrown toenails  Plan: -All treatment options discussed with the patient including all alternatives, risks, complications.  -Continue Penlac for nail fungus. -I discussed partial nail avulsions with ingrown toenails and she does not proceed with this but not today.  I will have her schedule her convenience for this.  Monitor for any infection. -Wearing compression socks -She has a prescription for orthotics.  Gave her the address and phone number for [place] clinic. -Patient encouraged to call the office with any questions, concerns, change in symptoms.   Trula Slade DPM

## 2021-11-24 ENCOUNTER — Other Ambulatory Visit: Payer: Self-pay | Admitting: *Deleted

## 2021-11-24 ENCOUNTER — Non-Acute Institutional Stay (HOSPITAL_COMMUNITY)
Admission: RE | Admit: 2021-11-24 | Discharge: 2021-11-24 | Disposition: A | Discharge status: Home / Self Care | Payer: Medicaid Other | Source: Ambulatory Visit | Attending: Internal Medicine | Admitting: Internal Medicine

## 2021-11-24 DIAGNOSIS — Z79899 Other long term (current) drug therapy: Secondary | ICD-10-CM

## 2021-11-24 DIAGNOSIS — G35 Multiple sclerosis: Secondary | ICD-10-CM | POA: Insufficient documentation

## 2021-11-24 MED ORDER — SODIUM CHLORIDE 0.9 % IV SOLN
300.0000 mg | Freq: Once | INTRAVENOUS | Status: AC
  Administered 2021-11-24: 300 mg via INTRAVENOUS
  Filled 2021-11-24: qty 15

## 2021-11-24 MED ORDER — SODIUM CHLORIDE 0.9 % IV SOLN: INTRAVENOUS | Status: DC | PRN

## 2021-11-24 NOTE — Progress Notes (Signed)
PATIENT CARE CENTER NOTE  Diagnosis:Multiple sclerosis [facility]) [G35]   Provider: Arlice Colt, MD  Procedure: Tysabri infusion  Note: Patient received Tysabri infusion (dose # 1 of 1) via PIV. Tolerated infusion well with no adverse reaction. Vital signs stable. AVS offered, but pt refused. Patient declined to stay for the 1 hour post infusion observation. Patient advised to schedule next appointment at front desk. Alert, oriented and ambulatory at discharge.

## 2021-11-30 ENCOUNTER — Telehealth: Payer: Self-pay | Admitting: Neurology

## 2021-11-30 NOTE — Telephone Encounter (Signed)
She is scheduled at GI for MRI on 11/3 [place]: [id number] exp. 11/30/29-01/14/22

## 2021-12-01 NOTE — Telephone Encounter (Signed)
Received the JCV lab and index value is 0.21 H JCV Antibody indeterminate Inhibition assay is negative

## 2021-12-02 ENCOUNTER — Inpatient Hospital Stay: Admission: RE | Admit: 2021-12-02 | Payer: Medicaid Other | Source: Ambulatory Visit

## 2021-12-06 ENCOUNTER — Ambulatory Visit: Payer: Medicaid Other | Admitting: Podiatry

## 2021-12-09 ENCOUNTER — Other Ambulatory Visit: Payer: Medicaid Other

## 2021-12-09 NOTE — Patient Outreach (Addendum)
  Medicaid Managed Care   Unsuccessful Outreach Note  12/09/2021 Name: Tamara [name] MRN: [id number] DOB: [birth year]  Referred by: Lucious Groves, DO Reason for referral : High Risk Managed Medicaid (MM Social work telephone outreach )   An unsuccessful telephone outreach was attempted today. The patient was referred to the case management team for assistance with care management and care coordination.   Follow Up Plan: The care management team will reach out to the patient again over the next 30 days.   [name], BSW, [place] Managed Medicaid Team  [phone]

## 2021-12-09 NOTE — Patient Instructions (Signed)
  Medicaid Managed Care   Unsuccessful Outreach Note  12/09/2021 Name: BREAH JOA MRN: [id number] DOB: 09-20-1960  Referred by: Lucious Groves, DO Reason for referral : High Risk Managed Medicaid (MM Social work telephone outreach )   An unsuccessful telephone outreach was attempted today. The patient was referred to the case management team for assistance with care management and care coordination.   Follow Up Plan: The care management team will reach out to the patient again over the next 30 days.   [name], BSW, [place] Managed Medicaid Team  [phone]

## 2021-12-20 ENCOUNTER — Other Ambulatory Visit (HOSPITAL_COMMUNITY): Payer: Self-pay

## 2021-12-21 ENCOUNTER — Other Ambulatory Visit: Payer: Self-pay | Admitting: *Deleted

## 2021-12-21 DIAGNOSIS — G35 Multiple sclerosis: Secondary | ICD-10-CM

## 2021-12-26 ENCOUNTER — Encounter (HOSPITAL_COMMUNITY): Payer: Medicaid Other

## 2021-12-27 ENCOUNTER — Telehealth (HOSPITAL_COMMUNITY): Payer: Self-pay

## 2021-12-27 ENCOUNTER — Ambulatory Visit (INDEPENDENT_AMBULATORY_CARE_PROVIDER_SITE_OTHER): Payer: Medicaid Other | Admitting: Student

## 2021-12-27 ENCOUNTER — Telehealth: Payer: Self-pay | Admitting: *Deleted

## 2021-12-27 ENCOUNTER — Encounter: Payer: Self-pay | Admitting: Student

## 2021-12-27 VITALS — BP 133/74 | HR 81 | Temp 98.3°F | Ht 65.3 in | Wt 191.0 lb

## 2021-12-27 DIAGNOSIS — J069 Acute upper respiratory infection, unspecified: Secondary | ICD-10-CM

## 2021-12-27 MED ORDER — FLUTICASONE PROPIONATE 50 MCG/ACT NA SUSP: 2.0000 | Freq: Every day | NASAL | 2 refills | Status: DC

## 2021-12-27 MED ORDER — OSELTAMIVIR PHOSPHATE 75 MG PO CAPS: 75.0000 mg | ORAL_CAPSULE | Freq: Two times a day (BID) | ORAL | 0 refills | Status: AC

## 2021-12-27 NOTE — Telephone Encounter (Signed)
Appt became available today with Dr Nooruddin @ 1415PM. Pt called and informed.

## 2021-12-27 NOTE — Telephone Encounter (Signed)
Spoke with pt, states she has been having flu like symptoms x 3 days, congested ears, hot and cold chills, sweating, sore throat and HA. States she reached out to her PCP about symptoms and needs to r/s Tysabri infusion that she missed yesterday. Pt instructed to call [facility] [facility] back at a later date to r/s infusion once symptoms have resolved and she is feeling better. Pt verbalized understanding. Dr. Garth Bigness RN Terrence Dupont notified via secure chat of pt complaint and that infusion will be postponed.

## 2021-12-27 NOTE — Telephone Encounter (Signed)
Call from pt - stated for 3 days, she has sore throat, cough, popping of ears, sneezing, itchy eyes, feeling hot/cold and runny nose. Using nasal spray to dry up nasal drip. Also taking Theraflu during the day and Robitussin at night. She has not taken a covid test - I suggested to take one. She has not had the flu shot. She canceled her MS treatment appt yesterday d/t not feeling well.  We do not have any open appts today. Stated she wants to see a doctor before she becomes too weak. Please advise.Thanks

## 2021-12-27 NOTE — Patient Instructions (Signed)
Thank you so much for coming to the clinic today! I'm sorry you're feeling so bad! We tested you for COVID and the flu today, when the results come back I will let you know. In the meanwhile, please continue taking the Robitussin-DM, and I have given you Tamiflu and Flonase at your pharmacy. Remember to hold off on your MS treatment at this time until you feel better.     If you have any questions please feel free to the call the clinic at anytime at [phone]. It was a pleasure seeing you!  Best, Dr. Sanjuana Mae

## 2021-12-27 NOTE — Telephone Encounter (Signed)
Received notification from [facility] that pt missed appt yesterday for Tysabri d/t cold/flu sx. Seeing PCP today and will call to r/s once better.

## 2021-12-28 NOTE — Progress Notes (Signed)
Internal Medicine Clinic Attending  I saw and evaluated the patient.  I personally confirmed the key portions of the history and exam documented by Dr. Sanjuana Mae and I reviewed pertinent patient test results.  The assessment, diagnosis, and plan were formulated together and I agree with the documentation in the resident's note. Counseled on appropriate daily dose of acetaminophen with use of OTC cold medication.

## 2021-12-28 NOTE — Addendum Note (Signed)
Addended by: Charise Killian on: 12/28/2021 10:08 PM   Modules accepted: Level of Service

## 2021-12-28 NOTE — Progress Notes (Signed)
CC: Upper respiratory tract infection  HPI:  [name] is a 61 y.o. female living with a history stated below and presents today for URTI symptoms. Please see problem based assessment and plan for additional details.  Past Medical History:  Diagnosis Date   Anemia    Bowel obstruction (HCC)    CKD (chronic kidney disease), stage II    based on labs   GERD (gastroesophageal reflux disease)    IBS (irritable bowel syndrome)    at age of 89   Multiple sclerosis ([place])    RBBB    Sciatica 2009   Vision abnormalities     Current Outpatient Medications on File Prior to Visit  Medication Sig Dispense Refill   acetaminophen (TYLENOL) 650 MG CR tablet Take 1,300 mg by mouth 3 (three) times daily as needed for pain.     ammonium lactate (AMLACTIN) 12 % lotion Apply 1 Application topically as needed for dry skin. 400 g 0   Cholecalciferol (VITAMIN D3) 50 MCG (2000 UT) TABS Take by mouth.     ciclopirox (PENLAC) 8 % solution Apply topically at bedtime. Apply over nail and surrounding skin. Apply daily over previous coat. After seven (7) days, may remove with alcohol and continue cycle. 6.6 mL 2   Coenzyme Q10-Fish Oil-Vit E (CO-Q 10 OMEGA-3 FISH OIL PO) Take 1 capsule by mouth daily.     COLLAGEN PO Take 1 Dose by mouth every other day. Powder; alternate with pill form.     COLLAGEN PO Take 3 tablets by mouth every other day. Pill; alternate with powder form.     Disposable Gloves (NITRILE GLOVES MEDIUM) MISC Use prn for bowel incontinence 100 each 5   hyoscyamine (LEVSIN) 0.125 MG tablet Take 1 tablet (0.125 mg total) by mouth 3 (three) times daily. 90 tablet 5   ibuprofen (ADVIL) 200 MG tablet Take 200 mg by mouth every 6 (six) hours as needed.     Lactobacillus (ACIDOPHILUS/PECTIN PO) Take by mouth.     loratadine (CLARITIN) 10 MG tablet Take 10 mg by mouth daily as needed for allergies or rhinitis.     LORazepam (ATIVAN) 1 MG tablet Take 1 tablet (1 mg total) by mouth once as  needed for up to 1 dose for sedation (before MRI). 2 tablet 0   magnesium citrate SOLN Take 1 Bottle by mouth once.     MINOXIDIL PO Take by mouth.     Multiple Vitamins-Minerals (HAIR/SKIN/NAILS) CAPS Take 1 capsule by mouth in the morning, at noon, and at bedtime.     Multiple Vitamins-Minerals (ONE-A-DAY WOMENS PO) Take 1 tablet by mouth daily.     natalizumab (TYSABRI) 300 MG/15ML injection Inject 300 mg into the vein every 28 (twenty-eight) days.     pantoprazole (PROTONIX) 40 MG tablet Take 1 tablet (40 mg total) by mouth daily. 90 tablet 1   phentermine 37.5 MG capsule Take 1 capsule by mouth once daily in the morning 30 capsule 5   Thiamine HCl (VITAMIN B-1 PO) Take by mouth.     tiZANidine (ZANAFLEX) 4 MG tablet 1/2 to 1 pill po tid (Patient taking differently: Take 2-4 mg by mouth 3 (three) times daily as needed for muscle spasms. 1/2 to 1 pill po tid) 90 tablet 5   topiramate (TOPAMAX) 50 MG tablet Take 1 tablet (50 mg total) by mouth 3 (three) times daily. 90 tablet 11   VITAMIN D PO Take 5,000 Units by mouth daily.  Zinc 50 MG TABS Take by mouth.     No current facility-administered medications on file prior to visit.    Family History  Problem Relation Age of Onset   Cancer Father        Gallbladder   Gallbladder disease Father    Hypertension Brother    Gallbladder disease Paternal Grandmother    Colon cancer Neg Hx    Colon polyps Neg Hx    Esophageal cancer Neg Hx    Kidney disease Neg Hx     Social History   Socioeconomic History   Marital status: Widowed    Spouse name: Not on file   Number of children: 2   Years of education: Not on file   Highest education level: Not on file  Occupational History   Occupation: Armed forces technical officer  Tobacco Use   Smoking status: Never   Smokeless tobacco: Never  Vaping Use   Vaping Use: Never used  Substance and Sexual Activity   Alcohol use: No    Alcohol/week: 0.0 standard drinks of alcohol   Drug use: No    Sexual activity: Not on file  Other Topics Concern   Not on file  Social History Narrative   Not on file   Social Determinants of Health   Financial Resource Strain: Not on file  Food Insecurity: No Food Insecurity (07/27/2021)   Hunger Vital Sign    Worried About Running Out of Food in the Last Year: Never true    Ran Out of Food in the Last Year: Never true  Transportation Needs: No Transportation Needs (07/27/2021)   PRAPARE - Hydrologist (Medical): No    Lack of Transportation (Non-Medical): No  Physical Activity: Inactive (01/21/2021)   Exercise Vital Sign    Days of Exercise per Week: 0 days    Minutes of Exercise per Session: 0 min  Stress: Not on file  Social Connections: Not on file  Intimate Partner Violence: Not on file    Review of Systems: ROS negative except for what is noted on the assessment and plan.  Vitals:   12/27/21 1444  BP: 133/74  Pulse: 81  Temp: 98.3 F (36.8 C)  TempSrc: Oral  SpO2: 100%  Weight: 191 lb (86.6 kg)  Height: 5' 5.3" (1.659 m)    Physical Exam: Constitutional: Cold with occasional chills female, in no acute distress HENT: normocephalic atraumatic, mucous membranes moist,  no erythma in nasal passages, ear canal, or pharynx Eyes: conjunctiva non-erythematous Neck: supple Cardiovascular: regular rate and rhythm, no m/r/g Pulmonary/Chest: normal work of breathing on room air, lungs clear to auscultation bilaterally  Assessment & Plan:   URTI (acute upper respiratory infection) Pt has been having a three day history of sneezing, coughing, nasal drainage, and itchy ears. Cough is productive of minor yellow phlegm occasionally. She has a history of MS, and had to miss her treatment with Tysarbi yesterday due to her symptoms. She had some friends stay with her this past weekend and believes one of them was sick. She also endorses chilsl at night, however unsure about fever because she takes tylenol '650mg'$   regularly because of her MS. She denies any chset pain, shortness of breath, lower extremity edema, nausea, vomiting, and diarrhea.   On physical exam HEENT was negative, and lungs were clear to aucultation.   Plan:  - Swabbed for COVID and Flu  - Started patient on Tamiflu until results of swab return  - In the meanwhile,.  Recommended symptomatic treatment with Robitussin-DM, theraflu, and Flo-Nase  Patient seen with Dr. Hayden Rasmussen Corbin Hott, M.D. [facility], PGY-1 Phone: [phone] Date 12/28/2021 Time 7:48 AM

## 2021-12-28 NOTE — Assessment & Plan Note (Signed)
Pt has been having a three day history of sneezing, coughing, nasal drainage, and itchy ears. Cough is productive of minor yellow phlegm occasionally. She has a history of MS, and had to miss her treatment with Tysarbi yesterday due to her symptoms. She had some friends stay with her this past weekend and believes one of them was sick. She also endorses chilsl at night, however unsure about fever because she takes tylenol '650mg'$  regularly because of her MS. She denies any chset pain, shortness of breath, lower extremity edema, nausea, vomiting, and diarrhea.   On physical exam HEENT was negative, and lungs were clear to aucultation.   Plan:  - Swabbed for COVID and Flu  - Started patient on Tamiflu until results of swab return  - In the meanwhile,. Recommended symptomatic treatment with Robitussin-DM, theraflu, and Flo-Nase

## 2021-12-29 ENCOUNTER — Telehealth: Payer: Self-pay | Admitting: Student

## 2021-12-29 LAB — COVID-19, FLU A+B NAA
Influenza A, NAA: NOT DETECTED
Influenza B, NAA: NOT DETECTED
SARS-CoV-2, NAA: DETECTED — AB

## 2021-12-29 NOTE — Telephone Encounter (Signed)
Spoke with patient on the phone, seems symptoms have for the most part resolved besides cough. She denies any fevers and chills. Consideration for paxlovid was done, however patient is out of window and symptoms have improved, so no need. Recommended continued symptomatic management and to stop Tamiflu.

## 2022-01-08 ENCOUNTER — Other Ambulatory Visit: Payer: Self-pay | Admitting: Neurology

## 2022-01-08 ENCOUNTER — Other Ambulatory Visit: Payer: Self-pay | Admitting: Internal Medicine

## 2022-01-10 NOTE — Telephone Encounter (Signed)
Next appt scheduled 03/30/22 with PCP.

## 2022-01-13 ENCOUNTER — Non-Acute Institutional Stay (HOSPITAL_COMMUNITY)
Admission: RE | Admit: 2022-01-13 | Discharge: 2022-01-13 | Disposition: A | Discharge status: Home / Self Care | Payer: Medicaid Other | Source: Ambulatory Visit | Attending: Internal Medicine | Admitting: Internal Medicine

## 2022-01-13 DIAGNOSIS — G35 Multiple sclerosis: Secondary | ICD-10-CM | POA: Diagnosis present

## 2022-01-13 MED ORDER — SODIUM CHLORIDE 0.9 % IV SOLN
300.0000 mg | INTRAVENOUS | Status: DC
  Administered 2022-01-13: 300 mg via INTRAVENOUS
  Filled 2022-01-13: qty 15

## 2022-01-13 MED ORDER — ACETAMINOPHEN 325 MG PO TABS: 650.0000 mg | ORAL_TABLET | ORAL | Status: DC | PRN

## 2022-01-13 MED ORDER — DIPHENHYDRAMINE HCL 25 MG PO CAPS: 25.0000 mg | ORAL_CAPSULE | ORAL | Status: DC | PRN

## 2022-01-13 MED ORDER — SODIUM CHLORIDE 0.9 % IV SOLN: INTRAVENOUS | Status: DC | PRN

## 2022-01-13 NOTE — Progress Notes (Signed)
PATIENT CARE CENTER NOTE  Diagnosis: Multiple sclerosis ([place]) [G35]    Provider: Arlice Colt  Procedure: Tysabri 300 mg  Note: Patient received Tysabri infusion (dose #1 of 3) via PIV. Tolerated infusion well with no adverse reaction. Vital signs stable. Patient observed for 45 minutes post infusion. AVS offered, but pt declined.  Patient advised to schedule next appointment at front desk. Pt is Alert, oriented and ambulatory at discharge.

## 2022-01-16 ENCOUNTER — Encounter: Payer: Self-pay | Admitting: *Deleted

## 2022-01-16 ENCOUNTER — Other Ambulatory Visit: Payer: Medicaid Other | Admitting: *Deleted

## 2022-01-16 NOTE — Patient Instructions (Signed)
Visit Information  Ms. Cedar was given information about Medicaid Managed Care team care coordination services as a part of their [place] Medicaid benefit. Darene Nappi Matich verbally consented to engagement with the [facility] Managed Care team.   If you are experiencing a medical emergency, please call 911 or report to your local emergency department or urgent care.   If you have a non-emergency medical problem during routine business hours, please contact your provider's office and ask to speak with a nurse.   For questions related to your [facility], please call: [phone] or visit the homepage here: [email or web address]  If you would like to schedule transportation through your [facility], please call the following number at least 2 days in advance of your appointment: [phone]   Rides for urgent appointments can also be made after hours by calling Member Services.  Call the [place] at [phone], at any time, 24 hours a day, 7 days a week. If you are in danger or need immediate medical attention call 911.  If you would like help to quit smoking, call 1-800-QUIT-NOW 317 707 6509) OR Espaol: 1-855-Djelo-Ya (2-025-427-0623) o para ms informacin haga clic aqu or Text READY to 200-400 to register via text  Ms. Dotter,   Please see education materials related to weight loss provided by MyChart link.  Patient verbalizes understanding of instructions and care plan provided today and agrees to view in [place]. Active MyChart status and patient understanding of how to access instructions and care plan via MyChart confirmed with patient.     Telephone follow up appointment with Managed Medicaid care management team member scheduled for:03/21/22 @ [place] RN, [place] RN Care  Coordinator   Following is a copy of your plan of care:  Care Plan : [place]  Updates made by Melissa Montane, RN since 01/16/2022 12:00 AM     Problem: Knowledge Deficit and Care Coordination Needs Related to Management of Multiple Sclerosis   Priority: High     Long-Range Goal: Development of Plan Of Care to Address Care Coordination Needs and Knowledge Deficits for Management of Multiple Sclerosis, prediabetes and HLD   Start Date: 01/21/2021  Expected End Date: 03/01/2022  Priority: High  Note:   Current Barriers:  Knowledge Deficits related to plan of care for management of Multiple Sclerosis, prediabetes and HLD  Care Coordination needs related to Lacks knowledge of community resource: family psychiatric evaluation to assist with securing custody of granddaughter [name]  Ms. Flinders continues to work on weight loss with exercise and diet. She would like to get down to #155.    RNCM Clinical Goal(s):  Patient will demonstrate ongoing adherence to prescribed treatment plan for multiple sclerosis as evidenced by no inpatient hospitalizations or ED visits due to MS exacerbation or patient reports of MS symptom exacerbation through collaboration with RN Care manager, provider, and care team.   Interventions: Inter-disciplinary care team collaboration (see longitudinal plan of care) Evaluation of current treatment plan related to  self management and patient's adherence to plan as established by provider Provided therapeutic listening Assisted patient with changing MyChart password   Weight Loss Interventions:  (Status:  Goal on track:  Yes.) Long Term Goal- exercising daily, cardio 15 min each day Advised patient to discuss with primary care provider options regarding weight management Reviewed recommended dietary changes: avoid fad diets, make small/incremental dietary and  exercise changes, eat at the table and avoid eating in front of the TV, plan management of  cravings, monitor snacking and cravings in food diary   Provided education for weight loss vis MyChart   Hyperlipidemia Interventions:  (Status:  Goal on track:  Yes.) Long Term Goal- Patient is exercising daily and has changed her diet Medication review performed; medication list updated in electronic medical record.  Counseled on importance of regular laboratory monitoring as prescribedEnsured patient received educational information on HLD mailed to her   Reviewed upcoming appointments including PCP 03/30/22   Prediabetes  (Status:  Goal on track:  Yes.)  Long Term Goal- Evaluation of current treatment plan related to  prediabetes ,  self-management and patient's adherence to plan as established by provider. Discussed plans with patient for ongoing care management follow up and provided patient with direct contact information for care management team Discussed the importance of weight loss, healthy diet, and exercise Provided encouragement to continue with the dietary changes and exercise routine   Patient Goals/Self-Care Activities: Take medications as prescribed   Attend all scheduled provider appointments Call pharmacy for medication refills 3-7 days in advance of running out of medications Perform all self care activities independently  Perform IADL's (shopping, preparing meals, housekeeping, managing finances) independently Call provider office for new concerns or questions  Call health plan member services to discuss benefits details specific to patient's needs Schedule colonoscopy

## 2022-01-16 NOTE — Patient Outreach (Signed)
Medicaid Managed Care   Nurse Care Manager Note  01/16/2022 Name:  Tamara [name] MRN:  [id number] DOB:  01-12-1961  Tamara [name] is an 61 y.o. year old female who is a primary patient of [name], DO.  The [facility] Managed Care Coordination team was consulted for assistance with:    HLD Prediabetes  Ms. Dempsey was given information about Medicaid Managed Care Coordination team services today. Tamara [name] Patient agreed to services and verbal consent obtained.  Engaged with patient by telephone for follow up visit in response to provider referral for case management and/or care coordination services.   Assessments/Interventions:  Review of past medical history, allergies, medications, health status, including review of consultants reports, laboratory and other test data, was performed as part of comprehensive evaluation and provision of chronic care management services.  SDOH (Social Determinants of Health) assessments and interventions performed: SDOH Interventions    Flowsheet Row Patient Outreach Telephone from 01/16/2022 in [place] [facility] Office Visit from 08/04/2021 in [facility] Patient Outreach Telephone from 07/27/2021 in [facility] Care Coordination Patient Outreach Telephone from 01/21/2021 in [facility] Care Coordination  SDOH Interventions      Food Insecurity Interventions Intervention Not Indicated -- Intervention Not Indicated --  Housing Interventions -- -- -- Intervention Not Indicated  Transportation Interventions Intervention Not Indicated -- Intervention Not Indicated Intervention Not Indicated  Depression Interventions/Treatment  -- Currently on Treatment -- --  Physical Activity Interventions Intervention Not Indicated -- -- Other (Comments)  [she needs a home exercise machine to keep her mobile - she will check with benefits at her managed Medicaid health plan]        Care Plan  Allergies  Allergen Reactions   Codeine Other (See Comments)    Delusions   Dilaudid [Hydromorphone Hcl] Swelling and Other (See Comments)    Tongue swells    Ditropan [Oxybutynin] Other (See Comments)    Burning sensation   Other Swelling and Other (See Comments)    Unnamed gel or antiseptic solution- Was applied to IV site with a needle- Turned the skin "black and blue" that remained (caused burning and phlebitis, also)   Paxil [Paroxetine Hcl] Other (See Comments)    Hallucinations and heavy periods    Tegretol [Carbamazepine] Itching   Trileptal [Oxcarbazepine] Swelling   Adhesive [Tape] Hives, Itching, Rash and Other (See Comments)    PAPER TAPE   Augmentin [Amoxicillin-Pot Clavulanate] Rash   Gabapentin Rash and Other (See Comments)    Hallucinations and depression, also    Medications Reviewed Today     Reviewed by Heidi Dach, RN (Registered Nurse) on 01/16/22 at [phone]  Med List Status: <None>   Medication Order Taking? Sig Documenting Provider Last Dose Status Informant  acetaminophen (TYLENOL) 650 MG CR tablet [id number]  Take 1,300 mg by mouth 3 (three) times daily as needed for pain. [provider]  Active Self           Med Note Antony Madura, Arn Medal   Thu Jul 10, 2019  4:59 PM) Patient stated she does not "mix" Tylenol with Tramadol  ammonium lactate (AMLACTIN) 12 % lotion [id number]  Apply 1 Application topically as needed for dry skin. Vivi Barrack, DPM  Active   Cholecalciferol (VITAMIN D3) 50 MCG (2000 UT) TABS [id number]  Take by mouth. [provider]  Active   ciclopirox (PENLAC) 8 % solution [id number]  Apply topically at bedtime.  Apply over nail and surrounding skin. Apply daily over previous coat. After seven (7) days, may remove with alcohol and continue cycle. Vivi Barrack, DPM  Active   Coenzyme Q10-Fish Oil-Vit E (CO-Q 10 OMEGA-3 FISH OIL PO) [id number]  Take 1 capsule by mouth daily. [provider]   Active Self  COLLAGEN PO [id number]  Take 1 Dose by mouth every other day. Powder; alternate with pill form. [provider]  Active Self  COLLAGEN PO [id number]  Take 3 tablets by mouth every other day. Pill; alternate with powder form. [provider]  Active Self  Disposable Gloves (NITRILE GLOVES MEDIUM) MISC [id number]  Use prn for bowel incontinence Sater, Pearletha Furl, MD  Active Self           Med Note Alben Deeds, Earlyne Iba   Fri Jan 21, 2021  9:47 AM) 5 M brand gloves  fluticasone (FLONASE) 50 MCG/ACT nasal spray [id number]  Place 2 sprays into both nostrils daily. Nooruddin, Jason Fila, MD  Active   hyoscyamine (LEVSIN) 0.125 MG tablet [id number]  Take 1 tablet (0.125 mg total) by mouth 3 (three) times daily. Sater, Pearletha Furl, MD  Active            Med Note (Francena Zender A   Wed Jul 27, 2021 11:04 AM) Has not taken since last week, due to drowsiness  ibuprofen (ADVIL) 200 MG tablet [id number]  Take 200 mg by mouth every 6 (six) hours as needed. [provider]  Active   Lactobacillus (ACIDOPHILUS/PECTIN PO) [id number]  Take by mouth. [provider]  Active   loratadine (CLARITIN) 10 MG tablet [id number]  Take 10 mg by mouth daily as needed for allergies or rhinitis. [provider]  Active Self  LORazepam (ATIVAN) 1 MG tablet [id number]  Take 1 tablet (1 mg total) by mouth once as needed for up to 1 dose for sedation (before MRI). [name], DO  Active            Med Note (Ericah Scotto A   Wed Nov 16, 2021  1:51 PM) Will take prior to MRI  magnesium citrate SOLN [id number]  Take 1 Bottle by mouth once. [provider]  Active   MINOXIDIL PO [id number]  Take by mouth. [provider]  Active   Multiple Vitamins-Minerals (HAIR/SKIN/NAILS) CAPS [id number]  Take 1 capsule by mouth in the morning, at noon, and at bedtime. [provider]  Active Self  Multiple Vitamins-Minerals (ONE-A-DAY WOMENS PO) [id number]  Take 1 tablet by mouth  daily. [provider]  Active Self  natalizumab (TYSABRI) 300 MG/15ML injection [id number]  Inject 300 mg into the vein every 28 (twenty-eight) days. [provider]  Active Self           Med Note Yetta Barre, EMMA L   Tue Aug 05, 2019 10:33 AM)  Received fax notification that pt is re-auth for Tysabri via touch program 08/05/19-02/25/20. Account: [facility]. Site auth number: [id number]. Pt enrollment number: [id number].  pantoprazole (PROTONIX) 40 MG tablet [id number]  Take 1 tablet by mouth once daily [name], DO  Active   phentermine 37.5 MG capsule [id number]  Take 1 capsule by mouth once daily in the morning Levert Feinstein, MD  Active   Thiamine HCl (VITAMIN B-1 PO) [id number]  Take by mouth. [provider]  Active   tiZANidine (ZANAFLEX) 4 MG tablet [id number]  1/2 to 1 pill po tid  Patient taking differently: Take 2-4 mg  by mouth 3 (three) times daily as needed for muscle spasms. 1/2 to 1 pill po tid   Asa Lente, MD  Active   topiramate (TOPAMAX) 50 MG tablet [id number]  Take 1 tablet (50 mg total) by mouth 3 (three) times daily. Asa Lente, MD  Active   VITAMIN D PO [id number]  Take 5,000 Units by mouth daily. [provider]  Active   Zinc 50 MG TABS [id number]  Take by mouth. [provider]  Active             Patient Active Problem List   Diagnosis Date Noted   Herpes zoster without complication 08/04/2021   Alopecia 08/04/2021   Foot pain, bilateral 08/04/2021   Prediabetes 05/25/2021   Vitamin D deficiency 06/14/2020   B12 deficiency 03/22/2020   Left-sided chest wall pain 12/18/2019   Spasticity 08/18/2019   Sleep apnea 07/18/2019   Bilateral hand numbness 01/14/2019   RLQ abdominal pain 10/22/2018   Pedal edema 06/04/2018   Gait disturbance 06/04/2018   Bilateral knee pain 01/07/2018   Conjunctivitis 10/30/2017   Dental caries 07/30/2017   Dyspnea on exertion 07/30/2017   Class 1 obesity  with body mass index (BMI) of 32.0 to 32.9 in adult 12/18/2016   URTI (acute upper respiratory infection) 12/08/2016   Dysesthesia 10/11/2016   High risk medication use 07/04/2016   Multiple sclerosis (HCC) 06/19/2016   Neurogenic bowel    Neurogenic bladder    Allergic rhinitis 04/12/2016   Sciatica associated with disorder of lumbosacral spine 01/15/2014   GERD (gastroesophageal reflux disease) 07/21/2013   RBBB on EKG 08/01/2012   IBS (irritable bowel syndrome) 06/19/2012   Healthcare maintenance 06/19/2012    Conditions to be addressed/monitored per PCP order:  HLD and Prediabetes  Care Plan : RN Care Manager Plan Of Care  Updates made by Heidi Dach, RN since 01/16/2022 12:00 AM     Problem: Knowledge Deficit and Care Coordination Needs Related to Management of Multiple Sclerosis   Priority: High     Long-Range Goal: Development of Plan Of Care to Address Care Coordination Needs and Knowledge Deficits for Management of Multiple Sclerosis, prediabetes and HLD   Start Date: 01/21/2021  Expected End Date: 03/01/2022  Priority: High  Note:   Current Barriers:  Knowledge Deficits related to plan of care for management of Multiple Sclerosis, prediabetes and HLD  Care Coordination needs related to Lacks knowledge of community resource: family psychiatric evaluation to assist with securing custody of granddaughter [name]  Ms. Lakeman continues to work on weight loss with exercise and diet. She would like to get down to #155.    RNCM Clinical Goal(s):  Patient will demonstrate ongoing adherence to prescribed treatment plan for multiple sclerosis as evidenced by no inpatient hospitalizations or ED visits due to MS exacerbation or patient reports of MS symptom exacerbation through collaboration with RN Care manager, provider, and care team.   Interventions: Inter-disciplinary care team collaboration (see longitudinal plan of care) Evaluation of current treatment plan related to  self  management and patient's adherence to plan as established by provider Provided therapeutic listening Assisted patient with changing MyChart password   Weight Loss Interventions:  (Status:  Goal on track:  Yes.) Long Term Goal- exercising daily, cardio 15 min each day Advised patient to discuss with primary care provider options regarding weight management Reviewed recommended dietary changes: avoid fad diets, make small/incremental dietary and exercise changes, eat at the table and avoid eating in front  of the TV, plan management of cravings, monitor snacking and cravings in food diary   Provided education for weight loss vis MyChart   Hyperlipidemia Interventions:  (Status:  Goal on track:  Yes.) Long Term Goal- Patient is exercising daily and has changed her diet Medication review performed; medication list updated in electronic medical record.  Counseled on importance of regular laboratory monitoring as prescribedEnsured patient received educational information on HLD mailed to her   Reviewed upcoming appointments including PCP 03/30/22   Prediabetes  (Status:  Goal on track:  Yes.)  Long Term Goal- Evaluation of current treatment plan related to  prediabetes ,  self-management and patient's adherence to plan as established by provider. Discussed plans with patient for ongoing care management follow up and provided patient with direct contact information for care management team Discussed the importance of weight loss, healthy diet, and exercise Provided encouragement to continue with the dietary changes and exercise routine   Patient Goals/Self-Care Activities: Take medications as prescribed   Attend all scheduled provider appointments Call pharmacy for medication refills 3-7 days in advance of running out of medications Perform all self care activities independently  Perform IADL's (shopping, preparing meals, housekeeping, managing finances) independently Call provider office for  new concerns or questions  Call health plan member services to discuss benefits details specific to patient's needs Schedule colonoscopy         Follow Up:  Patient agrees to Care Plan and Follow-up.  Plan: The Managed Medicaid care management team will reach out to the patient again over the next 60 days.  Date/time of next scheduled RN care management/care coordination outreach:  03/21/22 @ 9am  [name] RN, BSN [place]  [facility]

## 2022-02-10 ENCOUNTER — Encounter (HOSPITAL_COMMUNITY): Payer: Medicaid Other

## 2022-02-13 ENCOUNTER — Non-Acute Institutional Stay (HOSPITAL_COMMUNITY)
Admission: RE | Admit: 2022-02-13 | Discharge: 2022-02-13 | Disposition: A | Discharge status: Home / Self Care | Payer: Medicaid Other | Source: Ambulatory Visit | Attending: Internal Medicine | Admitting: Internal Medicine

## 2022-02-13 DIAGNOSIS — G35 Multiple sclerosis: Secondary | ICD-10-CM | POA: Insufficient documentation

## 2022-02-13 MED ORDER — SODIUM CHLORIDE 0.9 % IV SOLN: INTRAVENOUS | Status: DC | PRN

## 2022-02-13 MED ORDER — SODIUM CHLORIDE 0.9 % IV SOLN
300.0000 mg | INTRAVENOUS | Status: DC
  Administered 2022-02-13: 300 mg via INTRAVENOUS
  Filled 2022-02-13: qty 15

## 2022-02-13 NOTE — Progress Notes (Signed)
PATIENT CARE CENTER NOTE    Diagnosis: Multiple sclerosis ([place]) [G35]    Provider: Arlice Colt   Procedure: Tysabri 300 mg   Note: Patient received Tysabri infusion (dose # 2 of 3) via PIV. Tolerated infusion well with no adverse reaction. Vital signs stable. Patient observed for 30 minutes post infusion. AVS offered, but pt declined.  Patient advised to schedule next appointment at the front desk. Patient alert, oriented and ambulatory at discharge.

## 2022-02-14 ENCOUNTER — Other Ambulatory Visit: Payer: Self-pay | Admitting: Neurology

## 2022-03-13 ENCOUNTER — Non-Acute Institutional Stay (HOSPITAL_COMMUNITY)
Admission: RE | Admit: 2022-03-13 | Discharge: 2022-03-13 | Disposition: A | Discharge status: Home / Self Care | Payer: Medicaid Other | Source: Ambulatory Visit | Attending: Internal Medicine | Admitting: Internal Medicine

## 2022-03-13 DIAGNOSIS — G35 Multiple sclerosis: Secondary | ICD-10-CM | POA: Insufficient documentation

## 2022-03-13 MED ORDER — SODIUM CHLORIDE 0.9 % IV SOLN: INTRAVENOUS | Status: DC | PRN

## 2022-03-13 MED ORDER — SODIUM CHLORIDE 0.9 % IV SOLN
300.0000 mg | INTRAVENOUS | Status: DC
  Administered 2022-03-13: 300 mg via INTRAVENOUS
  Filled 2022-03-13: qty 15

## 2022-03-13 NOTE — Progress Notes (Signed)
PATIENT CARE CENTER NOTE     Diagnosis: Multiple sclerosis ([place]) [G35]    Provider: Arlice Colt   Procedure: Tysabri 300 mg   Note: Patient received Tysabri infusion (dose # 3 of 3) via PIV. Tolerated infusion well with no adverse reaction. Vital signs stable. Patient observed for 30 minutes post infusion. AVS offered, but pt declined.  Patient advised to schedule next appointment at the front desk. Patient alert, oriented and ambulatory at discharge.

## 2022-03-14 ENCOUNTER — Other Ambulatory Visit: Payer: Self-pay | Admitting: *Deleted

## 2022-03-14 DIAGNOSIS — G35 Multiple sclerosis: Secondary | ICD-10-CM

## 2022-03-16 ENCOUNTER — Ambulatory Visit (INDEPENDENT_AMBULATORY_CARE_PROVIDER_SITE_OTHER): Payer: Medicaid Other | Admitting: Internal Medicine

## 2022-03-16 ENCOUNTER — Encounter: Payer: Self-pay | Admitting: Internal Medicine

## 2022-03-16 ENCOUNTER — Other Ambulatory Visit: Payer: Self-pay

## 2022-03-16 VITALS — BP 139/73 | HR 70 | Temp 98.2°F | Ht 63.0 in | Wt 193.9 lb

## 2022-03-16 DIAGNOSIS — Z6830 Body mass index (BMI) 30.0-30.9, adult: Secondary | ICD-10-CM | POA: Diagnosis not present

## 2022-03-16 DIAGNOSIS — D84821 Immunodeficiency due to drugs: Secondary | ICD-10-CM

## 2022-03-16 DIAGNOSIS — Z79899 Other long term (current) drug therapy: Secondary | ICD-10-CM

## 2022-03-16 DIAGNOSIS — A059 Bacterial foodborne intoxication, unspecified: Secondary | ICD-10-CM

## 2022-03-16 DIAGNOSIS — E669 Obesity, unspecified: Secondary | ICD-10-CM

## 2022-03-16 DIAGNOSIS — R197 Diarrhea, unspecified: Secondary | ICD-10-CM | POA: Diagnosis not present

## 2022-03-16 MED ORDER — AZITHROMYCIN 500 MG PO TABS: 500.0000 mg | ORAL_TABLET | Freq: Every day | ORAL | 0 refills | Status: DC

## 2022-03-16 NOTE — Assessment & Plan Note (Signed)
Given her abdominal crampiness and change in diarrhea along with her immunosuppressed state the presence of possible food contamination.  I told her I like to get a stool culture and ova and parasite exam.  Will treat with empiric azithromycin 500 mg x 3 days.

## 2022-03-16 NOTE — Assessment & Plan Note (Signed)
She notes she has brought the package back to [employer] and they have sending onto the company.  She has requested from them it to be tested for any chemicals.  Reassured her I suspect that her throat irritation if it was due to something that likely will resolve with time and lack of further exposure we can monitor for things over time.  Will get a CBC and a CMP to look for any allergic/toxic effects.

## 2022-03-16 NOTE — Assessment & Plan Note (Signed)
She is disappointed to see her weight has gone up after period time with weight loss.  We briefly discussed this and she was interested in something she heard of intermittent fasting.  I explained to her the 8-16 method of intermittent fasting which she is interested in trying.

## 2022-03-16 NOTE — Progress Notes (Signed)
Established Patient Office Visit  Subjective   Patient ID: Tamara [name], female    DOB: 1960/12/18  Age: 62 y.o. MRN: [id number]  Chief Complaint  Patient presents with   ?Contaminated food   Tamara [name] presents today mainly for evaluation of possible food poisoning.  She notes that she bought a bag of frozen mangoes at [employer] and started eating about 2 weeks ago.  The second day she looked into the bag and saw a purple piece of plastic which she shows me some pictures.  She noted that normally she enjoys mangoes but with this back she had some throat irritation followed by stuffy nose, eye discharge-white mainly in the morning and also some nausea and cramping which is new.  She does have a history of diarrhea predominant IBS however she feels that since she first ate the frozen mangoes she has had worsened and different diarrhea.  Notably she is immunosuppressed with Tysabri for her MS.  At her last infusion she brought up this to her administering nurse and who urged her to come in for evaluation promptly.      Objective:     BP 139/73 (BP Location: Right Arm, Patient Position: Sitting, Cuff Size: Large)   Pulse 70   Temp 98.2 F (36.8 C) (Oral)   Ht 5' 3"$  (1.6 m)   Wt 193 lb 14.4 oz (88 kg)   SpO2 100% Comment: RA  BMI 34.35 kg/m  BP Readings from Last 3 Encounters:  03/16/22 139/73  03/13/22 137/72  02/13/22 135/76   Wt Readings from Last 3 Encounters:  03/16/22 193 lb 14.4 oz (88 kg)  12/27/21 191 lb (86.6 kg)  11/17/21 185 lb (83.9 kg)      Physical Exam Vitals and nursing note reviewed.  Constitutional:      Appearance: Normal appearance.  HENT:     Mouth/Throat:     Mouth: Mucous membranes are moist.     Pharynx: Oropharynx is clear. No oropharyngeal exudate.  Eyes:     Conjunctiva/sclera: Conjunctivae normal.     Pupils: Pupils are equal, round, and reactive to light.  Abdominal:     General: Abdomen is flat. Bowel sounds are normal. There is no distension.      Palpations: Abdomen is soft.     Tenderness: There is no abdominal tenderness.  Neurological:     Mental Status: She is alert.  Psychiatric:        Mood and Affect: Mood normal.      No results found for any visits on 03/16/22.  Last CBC Lab Results  Component Value Date   WBC 6.4 11/17/2021   HGB 11.5 11/17/2021   HCT 35.2 11/17/2021   MCV 88 11/17/2021   MCH 28.7 11/17/2021   RDW 13.8 11/17/2021   PLT 163 0000000   Last metabolic panel Lab Results  Component Value Date   GLUCOSE 89 03/31/2021   NA 144 03/31/2021   K 4.2 03/31/2021   CL 109 (H) 03/31/2021   CO2 23 03/31/2021   BUN 15 03/31/2021   CREATININE 0.94 03/31/2021   EGFR 69 03/31/2021   CALCIUM 9.4 03/31/2021   PROT 6.5 03/31/2021   ALBUMIN 4.5 03/31/2021   LABGLOB 2.0 03/31/2021   AGRATIO 2.3 (H) 03/31/2021   BILITOT <0.2 03/31/2021   ALKPHOS 72 03/31/2021   AST 13 03/31/2021   ALT 15 03/31/2021   ANIONGAP 10 09/07/2020      The 10-year ASCVD risk score (Arnett DK, et al., 2019)  is: 6.4%    Assessment & Plan:   Problem List Items Addressed This Visit       Digestive   Diarrhea of presumed infectious origin - Primary    Given her abdominal crampiness and change in diarrhea along with her immunosuppressed state the presence of possible food contamination.  I told her I like to get a stool culture and ova and parasite exam.  Will treat with empiric azithromycin 500 mg x 3 days.      Relevant Medications   azithromycin (ZITHROMAX) 500 MG tablet   Other Relevant Orders   CBC with Diff   CMP14 + Anion Gap   Ova and parasite examination   Culture, Stool     Other   Obesity (BMI 30.0-34.9)    She is disappointed to see her weight has gone up after period time with weight loss.  We briefly discussed this and she was interested in something she heard of intermittent fasting.  I explained to her the 8-16 method of intermittent fasting which she is interested in trying.       Immunocompromised state due to drug therapy ([place])   Relevant Orders   CBC with Diff   CMP14 + Anion Gap   Ova and parasite examination   Culture, Stool   Food contamination    She notes she has brought the package back to [facility] and they have sending onto the company.  She has requested from them it to be tested for any chemicals.  Reassured her I suspect that her throat irritation if it was due to something that likely will resolve with time and lack of further exposure we can monitor for things over time.  Will get a CBC and a CMP to look for any allergic/toxic effects.       Return in about 3 months (around 06/14/2022).    Lucious Groves, DO

## 2022-03-17 ENCOUNTER — Telehealth: Payer: Self-pay

## 2022-03-17 LAB — CBC WITH DIFFERENTIAL/PLATELET
Basophils Absolute: 0 10*3/uL (ref 0.0–0.2)
Basos: 0 %
EOS (ABSOLUTE): 0.1 10*3/uL (ref 0.0–0.4)
Eos: 1 %
Hematocrit: 38.5 % (ref 34.0–46.6)
Hemoglobin: 12.5 g/dL (ref 11.1–15.9)
Immature Grans (Abs): 0 10*3/uL (ref 0.0–0.1)
Immature Granulocytes: 0 %
Lymphocytes Absolute: 2.6 10*3/uL (ref 0.7–3.1)
Lymphs: 39 %
MCH: 29 pg (ref 26.6–33.0)
MCHC: 32.5 g/dL (ref 31.5–35.7)
MCV: 89 fL (ref 79–97)
Monocytes Absolute: 0.6 10*3/uL (ref 0.1–0.9)
Monocytes: 8 %
Neutrophils Absolute: 3.4 10*3/uL (ref 1.4–7.0)
Neutrophils: 52 %
Platelets: 154 10*3/uL (ref 150–450)
RBC: 4.31 x10E6/uL (ref 3.77–5.28)
RDW: 14 % (ref 11.7–15.4)
WBC: 6.7 10*3/uL (ref 3.4–10.8)

## 2022-03-17 LAB — CMP14 + ANION GAP
ALT: 23 IU/L (ref 0–32)
AST: 19 IU/L (ref 0–40)
Albumin/Globulin Ratio: 2.6 — ABNORMAL HIGH (ref 1.2–2.2)
Albumin: 4.6 g/dL (ref 3.9–4.9)
Alkaline Phosphatase: 92 IU/L (ref 44–121)
Anion Gap: 14 mmol/L (ref 10.0–18.0)
BUN/Creatinine Ratio: 13 (ref 12–28)
BUN: 14 mg/dL (ref 8–27)
Bilirubin Total: <0.2 mg/dL (ref 0.0–1.2)
CO2: 21 mmol/L (ref 20–29)
Calcium: 9.1 mg/dL (ref 8.7–10.3)
Chloride: 108 mmol/L — ABNORMAL HIGH (ref 96–106)
Creatinine, Ser: 1.07 mg/dL — ABNORMAL HIGH (ref 0.57–1.00)
Globulin, Total: 1.8 g/dL (ref 1.5–4.5)
Glucose: 96 mg/dL (ref 70–99)
Potassium: 4.4 mmol/L (ref 3.5–5.2)
Sodium: 143 mmol/L (ref 134–144)
Total Protein: 6.4 g/dL (ref 6.0–8.5)
eGFR: 59 mL/min/{1.73_m2} — ABNORMAL LOW (ref >59)

## 2022-03-17 NOTE — Telephone Encounter (Signed)
Pt is calling  to let Lab know she  can't make to bring her stool kit  today but she will be able to come next week .Marland Kitchen

## 2022-03-21 ENCOUNTER — Encounter: Payer: Self-pay | Admitting: *Deleted

## 2022-03-21 ENCOUNTER — Other Ambulatory Visit: Payer: Medicaid Other | Admitting: *Deleted

## 2022-03-21 ENCOUNTER — Other Ambulatory Visit: Payer: Medicaid Other

## 2022-03-21 DIAGNOSIS — D84821 Immunodeficiency due to drugs: Secondary | ICD-10-CM | POA: Diagnosis not present

## 2022-03-21 DIAGNOSIS — R197 Diarrhea, unspecified: Secondary | ICD-10-CM

## 2022-03-21 DIAGNOSIS — Z79899 Other long term (current) drug therapy: Secondary | ICD-10-CM

## 2022-03-21 NOTE — Patient Outreach (Signed)
Medicaid Managed Care   Nurse Care Manager Note  03/21/2022 Name:  Tamara [name] MRN:  [id number] DOB:  [birth year]  Tamara [name] is an 62 y.o. year old female who is a primary patient of Tamara Groves, DO.  The [facility] Managed Care Coordination team was consulted for assistance with:    HLD Weight loss Prediabetes  Tamara [name] was given information about Medicaid Managed Care Coordination team services today. [place] Patient agreed to services and verbal consent obtained.  Engaged with patient by telephone for follow up visit in response to provider referral for case management and/or care coordination services.   Assessments/Interventions:  Review of past medical history, allergies, medications, health status, including review of consultants reports, laboratory and other test data, was performed as part of comprehensive evaluation and provision of chronic care management services.  SDOH (Social Determinants of Health) assessments and interventions performed: SDOH Interventions    Flowsheet Row Patient Outreach Telephone from 03/21/2022 in [place] Patient Outreach Telephone from 01/16/2022 in [place] Office Visit from 08/04/2021 in [place] Patient Outreach Telephone from 07/27/2021 in [place] Patient Outreach Telephone from 01/21/2021 in [place] Coordination  SDOH Interventions       Food Insecurity Interventions Intervention Not Indicated Intervention Not Indicated -- Intervention Not Indicated --  Housing Interventions Intervention Not Indicated -- -- -- Intervention Not Indicated  Transportation Interventions -- Intervention Not Indicated -- Intervention Not Indicated Intervention Not Indicated  Utilities Interventions Intervention Not Indicated -- -- -- --  Depression Interventions/Treatment  -- -- Currently on  Treatment -- --  Physical Activity Interventions -- Intervention Not Indicated -- -- Other (Comments)  [she needs a home exercise machine to keep her mobile - she will check with benefits at her managed Medicaid health Tamara [name]  Allergies  Allergen Reactions   Codeine Other (See Comments)    Delusions   Dilaudid [Hydromorphone Hcl] Swelling and Other (See Comments)    Tongue swells    Ditropan [Oxybutynin] Other (See Comments)    Burning sensation   Other Swelling and Other (See Comments)    Unnamed gel or antiseptic solution- Was applied to IV site with a needle- Turned the skin "black and blue" that remained (caused burning and phlebitis, also)   Paxil [Paroxetine Hcl] Other (See Comments)    Hallucinations and heavy periods    Tegretol [Carbamazepine] Itching   Trileptal [Oxcarbazepine] Swelling   Adhesive [Tape] Hives, Itching, Rash and Other (See Comments)    PAPER TAPE   Augmentin [Amoxicillin-Pot Clavulanate] Rash   Gabapentin Rash and Other (See Comments)    Hallucinations and depression, also    Medications Reviewed Today     Reviewed by Melissa Montane, RN (Registered Nurse) on 03/21/22 at [place] List Status: <None>   Medication Order Taking? Sig Documenting Provider Last Dose Status Informant  acetaminophen (TYLENOL) 650 MG CR tablet [id number] Yes Take 1,300 mg by mouth 3 (three) times daily as needed for pain. [provider] Taking Active Self           Med Note Tamara [name], Para [place] Jul 10, 2019  4:59 PM) Patient stated she does not "mix" Tylenol with Tramadol  ammonium lactate (AMLACTIN) 12 % lotion [id number] Yes Apply 1 Application topically as needed for dry skin. Tamara [name], DPM Taking Active  ascorbic acid (VITAMIN C) 500 MG tablet [id number] Yes Take 1,500 mg by mouth daily. [provider] Taking Active   azithromycin (ZITHROMAX) 500 MG tablet [id number] No Take 1 tablet (500 mg total) by mouth daily.  Patient not  taking: Reported on 03/21/2022   Tamara Groves, DO Not Taking Active            Med Note (Crue Otero A   Tue Mar 21, 2022  9:15 AM) Needs to pick up from pharmacy  Cholecalciferol (VITAMIN D3) 50 MCG (2000 UT) TABS [id number] Yes Take 5 tablets by mouth daily. [provider] Taking Active   ciclopirox (PENLAC) 8 % solution [id number] Yes Apply topically at bedtime. Apply over nail and surrounding skin. Apply daily over previous coat. After seven (7) days, may remove with alcohol and continue cycle. Tamara [name], DPM Taking Active   Coenzyme Q10-Fish Oil-Vit E (CO-Q 10 OMEGA-3 FISH OIL PO) [id number] Yes Take 1 capsule by mouth daily. [provider] Taking Active Self  COLLAGEN PO [id number] No Take 1 Dose by mouth every other day. Powder; alternate with pill form.  Patient not taking: Reported on 03/21/2022   [provider] Not Taking Active Self  COLLAGEN PO [id number] Yes Take 3 tablets by mouth every other day. Pill; alternate with powder form. [provider] Taking Active Self  cyanocobalamin (VITAMIN B12) 1000 MCG tablet [id number] Yes Take 5,000 mcg by mouth daily. ODT [provider] Taking Active   Disposable Gloves (NITRILE GLOVES MEDIUM) MISC [id number]  Use prn for bowel incontinence  Patient not taking: Reported on 01/16/2022   Britt Bottom, MD  Active Self           Med Note Tamara [name], Tamara [name]   Fri Jan 21, 2021  9:47 AM) 5 M brand gloves  fluticasone (FLONASE) 50 MCG/ACT nasal spray [id number] Yes Place 2 sprays into both nostrils daily. Tamara [name], Tamara Lard, MD Taking Active   hyoscyamine (LEVSIN) 0.125 MG tablet [id number] Yes Take 1 tablet (0.125 mg total) by mouth 3 (three) times daily. Sater, Nanine Means, MD Taking Active            Med Note Thamas Jaegers, Tyrease Vandeberg A   Mon Jan 16, 2022  9:28 AM)    ibuprofen (ADVIL) 200 MG tablet [id number] Yes Take 200 mg by mouth every 6 (six) hours as needed. [provider] Taking Active    Lactobacillus (ACIDOPHILUS/PECTIN PO) [id number] Yes Take by mouth. [provider] Taking Active   loratadine (CLARITIN) 10 MG tablet [id number] Yes Take 10 mg by mouth daily as needed for allergies or rhinitis. [provider] Taking Active Self  LORazepam (ATIVAN) 1 MG tablet [id number] No Take 1 tablet (1 mg total) by mouth once as needed for up to 1 dose for sedation (before MRI).  Patient not taking: Reported on 01/16/2022   Tamara Groves, DO Not Taking Active            Med Note (Recia Sons A   Wed Nov 16, 2021  1:51 PM) Will take prior to MRI  magnesium citrate SOLN [id number] Yes Take 1 Bottle by mouth once. [provider] Taking Active            Med Note (Khara Renaud A   Mon Jan 16, 2022  9:30 AM) As needed  MINOXIDIL PO [id number] No Take by mouth.  Patient not taking: Reported on 01/16/2022   [provider] Not Taking Active   Multiple Vitamins-Minerals (  HAIR/SKIN/NAILS) CAPS [id number] Yes Take 1 capsule by mouth in the morning, at noon, and at bedtime. [provider] Taking Active Self  Multiple Vitamins-Minerals (ONE-A-DAY WOMENS PO) [id number] Yes Take 1 tablet by mouth daily. [provider] Taking Active Self  natalizumab (TYSABRI) 300 MG/15ML injection [id number] Yes Inject 300 mg into the vein every 28 (twenty-eight) days. [provider] Taking Active Self           Med Note Ronnald Ramp, EMMA L   Tue Aug 05, 2019 10:33 AM)  Received fax notification that pt is re-auth for Tysabri via touch program 08/05/19-02/25/20. Account: [facility]. Site auth number: [id number]. Pt enrollment number: [id number].  pantoprazole (PROTONIX) 40 MG tablet [id number] Yes Take 1 tablet by mouth once daily Tamara Groves, DO Taking Active   phentermine 37.5 MG capsule [id number] Yes Take 1 capsule by mouth once daily in the morning Marcial Pacas, MD Taking Active   Thiamine HCl (VITAMIN B-1 PO) [id number] Yes Take by  mouth. [provider] Taking Active   tiZANidine (ZANAFLEX) 4 MG tablet [id number] Yes 1/2 to 1 pill po tid  Patient taking differently: Take 2-4 mg by mouth 3 (three) times daily as needed for muscle spasms. 1/2 to 1 pill po tid   Britt Bottom, MD Taking Active   topiramate (TOPAMAX) 50 MG tablet [id number] Yes Take 1 tablet (50 mg total) by mouth 3 (three) times daily. Britt Bottom, MD Taking Active   VITAMIN D PO [id number] Yes Take 5,000 Units by mouth daily. [provider] Taking Active   Zinc 50 MG TABS [id number] Yes Take by mouth. [provider] Taking Active             Patient Active Problem List   Diagnosis Date Noted   Diarrhea of presumed infectious origin 03/16/2022   Immunocompromised state due to drug therapy ([place]) 03/16/2022   Food contamination 03/16/2022   Herpes zoster without complication [id number]   Alopecia 08/04/2021   Foot pain, bilateral 08/04/2021   Prediabetes 05/25/2021   Vitamin D deficiency 06/14/2020   B12 deficiency 03/22/2020   Left-sided chest wall pain 12/18/2019   Spasticity 08/18/2019   Sleep apnea 07/18/2019   Bilateral hand numbness 01/14/2019   RLQ abdominal pain 10/22/2018   Pedal edema 06/04/2018   Gait disturbance 06/04/2018   Bilateral knee pain 01/07/2018   Conjunctivitis 10/30/2017   Dental caries 07/30/2017   Dyspnea on exertion 07/30/2017   Obesity (BMI 30.0-34.9) 12/18/2016   URTI (acute upper respiratory infection) 12/08/2016   Dysesthesia 10/11/2016   High risk medication use 07/04/2016   Multiple sclerosis ([place]) 06/19/2016   Neurogenic bowel    Neurogenic bladder    Allergic rhinitis 04/12/2016   Sciatica associated with disorder of lumbosacral spine 01/15/2014   GERD (gastroesophageal reflux disease) 07/21/2013   RBBB on EKG 08/01/2012   IBS (irritable bowel syndrome) 06/19/2012   Healthcare maintenance 06/19/2012    Conditions to be addressed/monitored per PCP order:  HLD and  weight loss and prediabetes  Care Plan : [place]  Updates made by Melissa Montane, RN since 03/21/2022 12:00 AM     Problem: Knowledge Deficit and Care Coordination Needs Related to Management of Multiple Sclerosis   Priority: High     Long-Range Goal: Development of Plan Of Care to Address Care Coordination Needs and Knowledge Deficits for Management of Multiple Sclerosis, prediabetes and HLD   Start Date: 01/21/2021  Expected End  Date: 05/30/2022  Priority: High  Note:   Current Barriers:  Knowledge Deficits related to plan of care for management of Multiple Sclerosis, prediabetes and HLD  Care Coordination needs related to Lacks knowledge of community resource: family psychiatric evaluation to assist with securing custody of granddaughter [name]  Ms. Cruces was recently evaluated by PCP for possible food poisoning. Labs ordered for stool culture. Ms. Kaltz will attempt to collect stool sample and submit today. She is no longer having diarrhea and starting to feel better slowly. She is planning to pick up ordered antibiotic and will start taking after she collects the stool sample. She continues to exercise daily.  RNCM Clinical Goal(s):  Patient will demonstrate ongoing adherence to prescribed treatment plan for multiple sclerosis as evidenced by no inpatient hospitalizations or ED visits due to MS exacerbation or patient reports of MS symptom exacerbation through collaboration with RN Care manager, provider, and care team.   Interventions: Inter-disciplinary care team collaboration (see longitudinal plan of care) Evaluation of current treatment plan related to  self management and patient's adherence to plan as established by provider Provided therapeutic listening Advised patient to collect requested stool sample and return to the office Advised patient to pick up antibiotics and start taking as prescribed by provider Discussed referral to LCSW for managing  stress-patient declined Advised patient to arrive to all appointments at least 20 minutes early Provided information on managing stress   Weight Loss Interventions:  (Status:  Goal on track:  Yes.) Long Term Goal- exercising daily, cardio 15 min each day, recent weight #193, planning to start intermittent fasting Advised patient to discuss with primary care provider options regarding weight management Reviewed recommended dietary changes: avoid fad diets, make small/incremental dietary and exercise changes, eat at the table and avoid eating in front of the TV, plan management of cravings, monitor snacking and cravings in food diary   Provided encouragement   Hyperlipidemia Interventions:  (Status:  Goal on track:  Yes.) Long Term Goal- Patient is exercising daily and has changed her diet Medication review performed; medication list updated in electronic medical record.  Counseled on importance of regular laboratory monitoring as prescribed   Reviewed upcoming appointments including scheduling follow up with Neurology and PCP   Prediabetes  (Status:  Goal on track:  Yes.)  Long Term Goal- Evaluation of current treatment plan related to  prediabetes ,  self-management and patient's adherence to plan as established by provider. Discussed plans with patient for ongoing care management follow up and provided patient with direct contact information for care management team Discussed the importance of weight loss, healthy diet, and exercise Provided encouragement to continue with the dietary changes and exercise routine   Patient Goals/Self-Care Activities: Take medications as prescribed   Attend all scheduled provider appointments Call pharmacy for medication refills 3-7 days in advance of running out of medications Perform all self care activities independently  Perform IADL's (shopping, preparing meals, housekeeping, managing finances) independently Call provider office for new concerns or  questions  Call health plan member services to discuss benefits details specific to patient's needs Schedule colonoscopy         Follow Up:  Patient agrees to Care Plan and Follow-up.  Plan: The Managed Medicaid care management team will reach out to the patient again over the next 60 days.  Date/time of next scheduled RN care management/care coordination outreach:  05/23/22 @ [place] RN, BSN [place]  [employer] manager

## 2022-03-21 NOTE — Patient Instructions (Signed)
Visit Information  Tamara [name] was given information about Medicaid Managed Care team care coordination services as a part of their [place] Medicaid benefit. Tamara [name] Amer verbally consented to engagement with the [facility] Managed Care team.   If you are experiencing a medical emergency, please call 911 or report to your local emergency department or urgent care.   If you have a non-emergency medical problem during routine business hours, please contact your provider's office and ask to speak with a nurse.   For questions related to your [facility], please call: [phone] or visit the homepage here: [email or web address]  If you would like to schedule transportation through your [facility], please call the following number at least 2 days in advance of your appointment: [phone]   Rides for urgent appointments can also be made after hours by calling Member Services.  Call the [place] at [phone], at any time, 24 hours a day, 7 days a week. If you are in danger or need immediate medical attention call 911.  If you would like help to quit smoking, call 1-800-QUIT-NOW 318 216 8434) OR Espaol: 1-855-Djelo-Ya HD:1601594) o para ms informacin haga clic aqu or Text READY to 200-400 to register via text  Tamara [name],   Please see education materials related to managing stress provided by MyChart link.  Patient verbalizes understanding of instructions and care plan provided today and agrees to view in [place]. Active MyChart status and patient understanding of how to access instructions and care plan via MyChart confirmed with patient.     Telephone follow up appointment with Managed Medicaid care management team member scheduled for:05/23/22 @ [place] RN, BSN [place] RN Care  Coordinator   Following is a copy of your plan of care:  Care Plan : [place]  Updates made by Melissa Montane, RN since 03/21/2022 12:00 AM     Problem: Knowledge Deficit and Care Coordination Needs Related to Management of Multiple Sclerosis   Priority: High     Long-Range Goal: Development of Plan Of Care to Address Care Coordination Needs and Knowledge Deficits for Management of Multiple Sclerosis, prediabetes and HLD   Start Date: 01/21/2021  Expected End Date: 05/30/2022  Priority: High  Note:   Current Barriers:  Knowledge Deficits related to plan of care for management of Multiple Sclerosis, prediabetes and HLD  Care Coordination needs related to Lacks knowledge of community resource: family psychiatric evaluation to assist with securing custody of granddaughter [name]  Tamara [name] was recently evaluated by PCP for possible food poisoning. Labs ordered for stool culture. Tamara [name] will attempt to collect stool sample and submit today. She is no longer having diarrhea and starting to feel better slowly. She is planning to pick up ordered antibiotic and will start taking after she collects the stool sample. She continues to exercise daily.  RNCM Clinical Goal(s):  Patient will demonstrate ongoing adherence to prescribed treatment plan for multiple sclerosis as evidenced by no inpatient hospitalizations or ED visits due to MS exacerbation or patient reports of MS symptom exacerbation through collaboration with RN Care manager, provider, and care team.   Interventions: Inter-disciplinary care team collaboration (see longitudinal plan of care) Evaluation of current treatment plan related to  self management and patient's adherence to plan as established by provider Provided therapeutic listening Advised patient to collect requested stool sample and return to the office  Advised patient to pick up antibiotics and start taking as prescribed by provider Discussed referral  to LCSW for managing stress-patient declined Advised patient to arrive to all appointments at least 20 minutes early Provided information on managing stress   Weight Loss Interventions:  (Status:  Goal on track:  Yes.) Long Term Goal- exercising daily, cardio 15 min each day, recent weight #193, planning to start intermittent fasting Advised patient to discuss with primary care provider options regarding weight management Reviewed recommended dietary changes: avoid fad diets, make small/incremental dietary and exercise changes, eat at the table and avoid eating in front of the TV, plan management of cravings, monitor snacking and cravings in food diary   Provided encouragement   Hyperlipidemia Interventions:  (Status:  Goal on track:  Yes.) Long Term Goal- Patient is exercising daily and has changed her diet Medication review performed; medication list updated in electronic medical record.  Counseled on importance of regular laboratory monitoring as prescribed   Reviewed upcoming appointments including scheduling follow up with Neurology and PCP   Prediabetes  (Status:  Goal on track:  Yes.)  Long Term Goal- Evaluation of current treatment plan related to  prediabetes ,  self-management and patient's adherence to plan as established by provider. Discussed plans with patient for ongoing care management follow up and provided patient with direct contact information for care management team Discussed the importance of weight loss, healthy diet, and exercise Provided encouragement to continue with the dietary changes and exercise routine   Patient Goals/Self-Care Activities: Take medications as prescribed   Attend all scheduled provider appointments Call pharmacy for medication refills 3-7 days in advance of running out of medications Perform all self care activities independently  Perform IADL's (shopping, preparing meals, housekeeping, managing finances) independently Call provider office  for new concerns or questions  Call health plan member services to discuss benefits details specific to patient's needs Schedule colonoscopy

## 2022-03-23 ENCOUNTER — Other Ambulatory Visit: Payer: Self-pay | Admitting: Neurology

## 2022-03-23 LAB — OVA AND PARASITE EXAMINATION

## 2022-03-26 LAB — STOOL CULTURE: E coli, Shiga toxin Assay: NEGATIVE

## 2022-03-30 ENCOUNTER — Telehealth: Payer: Self-pay | Admitting: Neurology

## 2022-03-30 ENCOUNTER — Encounter: Payer: Medicaid Other | Admitting: Internal Medicine

## 2022-03-30 NOTE — Telephone Encounter (Signed)
Pt called in stating that unlike the majority of patients that are seen here she is very coherent, not wheelchair bound and is a highly educated black woman.  Pt states that because of notes put in her chart in an attempt to get her removed from the practice and to look bad to her Dr she is without her medication for almost a week now(Pt did not mention the name of the medication). Pt believes that Jinny Blossom, NP has belittled her in front of others and if Jinny Blossom believes that she is going to get her dismissed from the practice she should prepare for a lawsuit because pt knows her rights.  Pt is asking to be called by her Dr for her medication.  Within the 30 min. Call pt expressed her dissatisfaction of how she was talked to and treated.  Pt stated she knows her rights .  When pt began repeating herself phone rep advised pt that the time exceeded that she could allow for this call.

## 2022-03-30 NOTE — Telephone Encounter (Addendum)
Spoke with Tamara [name]. Appears rx phentermine sent to Dr. Krista Blue yesterday, still pending approval.  She is due for f/u around 05/19/22 but has nothing scheduled. Pt is not dismissed from our practice.  However, appears she has bad debt with our office. She would have to speak with billing prior to getting scheduled. Angie confirmed she currently has a balance of 2327.79.  Called pt. Relayed above info about balance she has with our office. She thought she was paid up. Advised it would be best for her to talk with billing. After this is resolved, I am happy to make her f/u that she is due for in April. She relayed she is willing to make payment tomorrow by phone if able. Aware I will have billing department call her back to discuss options.I also confirmed it was phentermine that she was talking about. Aware rx sent to Dr. Krista Blue since Dr. Felecia Shelling out until next week. Aware I will send her another message to approve rx refill for her.   Pt also wanted to let Dr. Felecia Shelling know that she got mangos from [facility]. The plastic it was wrapped in  made her sick. PCP placed her on azithromycin x3 days. Doctor thought she may have parasites from this. Unable to eat x3 days. Had stool tested/labs recently. Waiting on these results. Results in epic. Waiting on PCP to call her about these results. Feels this affected her MS, had prob w/ gait, muscle spasms, dropping items.Having more issues with L hand/fine motor skills. She has continued with Tysabri infusion.   Did make her aware any sort of infection/illness can cause MS sx to worsen until infection/illness cleared up.   She also reports she has to have ingrown toenails removed. Doctor told her it was okay to walk/drive after procedure. Noticed her skin is very dry after event with mango/plastic. She will let PCP know.

## 2022-03-31 NOTE — Telephone Encounter (Signed)
Pt called checking on refill for phentermine 37.5 MG capsule.  I Deneise Lever) reviewed the chart notes showed pt would be paying bill on Monday. Pt stated, "that has nothing to do with me getting my medication. If I do not get my refill today I am going to call Medicaid on one of the Megans that work at your office. I know there are two Hedwig Morton and Versie Starks told me that refill would be sent in with no problem. I called the pharmacy and they instructed me to call your office. I do not know why y'all are messing with me. Have been out of medication for a week." I Deneise Lever) checked to see if medication had been sent to the pharmacy. The chart shows sent to the pharmacy confirmed receiving on 03/30/22 at 1:07 pm. Relayed this to the pt. She said I will call the pharmacy if refill is not there I will be calling back."

## 2022-04-03 NOTE — Telephone Encounter (Signed)
I reviewed pt chart and confirmed rx phentermine was called in by Dr. Krista Blue 03/30/22.

## 2022-04-04 ENCOUNTER — Telehealth: Payer: Self-pay | Admitting: *Deleted

## 2022-04-04 NOTE — Telephone Encounter (Signed)
Call from pt who stated she needs rx for gloves sent to [facility]. She needs Nitrile Government social research officer with [employer]; color-black; Sizes Medium 71m and Large 871m 100/box. Stated the gloves prevent the transfer of electricity and she had discussed this w/Dr HoHeber CarolinaAnd she wants Dr HoHeber Carolinao call her with lab results. Thanks

## 2022-04-07 NOTE — Telephone Encounter (Signed)
I have written a paper script for this, will place it in the office bin this afternoon if someone could fax it to [facility], otherwise it could be mailed to [place] or she could pick it up.

## 2022-04-10 ENCOUNTER — Non-Acute Institutional Stay (HOSPITAL_COMMUNITY)
Admission: RE | Admit: 2022-04-10 | Discharge: 2022-04-10 | Disposition: A | Discharge status: Home / Self Care | Payer: Medicaid Other | Source: Ambulatory Visit | Attending: Internal Medicine | Admitting: Internal Medicine

## 2022-04-10 DIAGNOSIS — G35 Multiple sclerosis: Secondary | ICD-10-CM | POA: Insufficient documentation

## 2022-04-10 MED ORDER — SODIUM CHLORIDE 0.9 % IV SOLN
300.0000 mg | INTRAVENOUS | Status: DC
  Administered 2022-04-10: 300 mg via INTRAVENOUS
  Filled 2022-04-10: qty 15

## 2022-04-10 MED ORDER — SODIUM CHLORIDE 0.9 % IV SOLN: INTRAVENOUS | Status: DC | PRN

## 2022-04-10 NOTE — Progress Notes (Signed)
PATIENT CARE CENTER NOTE   Diagnosis: Multiple sclerosis ([place]) [G35]    Provider: Arlice Colt, MD   Procedure: Tysabri infusion    Note: Patient received Tysabri 300 mg infusion (dose 1 of 3) via PIV. Patient tolerated infusion well with no adverse reaction. Observed patient for 1 hour post infusion. Vital signs stable. AVS offered but patient refused. Patient alert, oriented and ambulatory at discharge.

## 2022-04-11 NOTE — Telephone Encounter (Signed)
Dr Heber [place] where did you place the rx?

## 2022-04-12 ENCOUNTER — Encounter: Payer: Self-pay | Admitting: *Deleted

## 2022-04-12 NOTE — Telephone Encounter (Signed)
Rx faxed to [facility] - pt informed.

## 2022-05-04 ENCOUNTER — Other Ambulatory Visit: Payer: Self-pay | Admitting: Neurology

## 2022-05-05 ENCOUNTER — Other Ambulatory Visit: Payer: Self-pay

## 2022-05-05 MED ORDER — PANTOPRAZOLE SODIUM 40 MG PO TBEC: 40.0000 mg | DELAYED_RELEASE_TABLET | Freq: Every day | ORAL | 0 refills | Status: DC

## 2022-05-08 MED ORDER — PHENTERMINE HCL 37.5 MG PO CAPS: 37.5000 mg | ORAL_CAPSULE | Freq: Every morning | ORAL | 0 refills | Status: DC

## 2022-05-08 NOTE — Telephone Encounter (Signed)
Requested Prescriptions   Pending Prescriptions Disp Refills   phentermine 37.5 MG capsule [Pharmacy Med Name: Phentermine HCl 37.5 MG Oral Capsule] 30 capsule 0    Sig: Take 1 capsule by mouth once daily in the morning   Last seen by sater 11/17/21, no upcoming visit scheduled. Pt must make an appt

## 2022-05-08 NOTE — Telephone Encounter (Signed)
Sent request to MD

## 2022-05-08 NOTE — Addendum Note (Signed)
Addended by: Arther Abbott on: 05/08/2022 11:45 AM   Modules accepted: Orders

## 2022-05-09 ENCOUNTER — Non-Acute Institutional Stay (HOSPITAL_COMMUNITY)
Admission: RE | Admit: 2022-05-09 | Discharge: 2022-05-09 | Disposition: A | Discharge status: Home / Self Care | Payer: Medicaid Other | Source: Ambulatory Visit | Attending: Internal Medicine | Admitting: Internal Medicine

## 2022-05-09 DIAGNOSIS — G35 Multiple sclerosis: Secondary | ICD-10-CM | POA: Diagnosis present

## 2022-05-09 MED ORDER — SODIUM CHLORIDE 0.9 % IV SOLN
300.0000 mg | INTRAVENOUS | Status: DC
  Administered 2022-05-09: 300 mg via INTRAVENOUS
  Filled 2022-05-09: qty 15

## 2022-05-09 MED ORDER — SODIUM CHLORIDE 0.9 % IV SOLN: INTRAVENOUS | Status: DC | PRN

## 2022-05-09 NOTE — Progress Notes (Signed)
PATIENT CARE CENTER NOTE  Diagnosis:Multiple sclerosis [facility]) [G35]    Provider: Despina Arias, MD   Procedure: Tysabri infusion   Note: Pt received Tysabri 300 mg (dose #2 of 3) via PIV. Pt tolerated infusion well with no adverse reaction. Pt observed for 1 hour post infusion. AVS offered, but pt declined. Pt is alert, oriented, and ambulatory at discharge.

## 2022-05-23 ENCOUNTER — Other Ambulatory Visit: Payer: Medicaid Other | Admitting: *Deleted

## 2022-05-23 NOTE — Patient Outreach (Signed)
  Medicaid Managed Care   Unsuccessful Attempt Note   05/23/2022 Name: ALLEEN KEHM MRN: [id number] DOB: [birth year]  Referred by: [name], DO Reason for referral : High Risk Managed Medicaid (Unsuccessful RNCM follow up telephone outreach)   An unsuccessful telephone outreach was attempted today. The patient was referred to the case management team for assistance with care management and care coordination.    Follow Up Plan: The Managed Medicaid care management team will reach out to the patient again over the next 7 days.    [name] RN, BSN [place]  Managed [facility] RN Care Coordinator [phone]

## 2022-05-23 NOTE — Patient Instructions (Signed)
Visit Information  [name]  - as a part of your Medicaid benefit, you are eligible for care management and care coordination services at no cost or copay. I was unable to reach you by phone today but would be happy to help you with your health related needs. Please feel free to call me @ [phone].   A member of the Managed Medicaid care management team will reach out to you again over the next 7 days.   [name] RN, BSN [place]  Managed [facility] RN Care Coordinator [phone]

## 2022-06-08 ENCOUNTER — Other Ambulatory Visit: Payer: Self-pay | Admitting: Neurology

## 2022-06-08 ENCOUNTER — Non-Acute Institutional Stay (HOSPITAL_COMMUNITY)
Admission: RE | Admit: 2022-06-08 | Discharge: 2022-06-08 | Disposition: A | Discharge status: Home / Self Care | Payer: Medicaid Other | Source: Ambulatory Visit | Attending: Internal Medicine | Admitting: Internal Medicine

## 2022-06-08 ENCOUNTER — Telehealth: Payer: Self-pay

## 2022-06-08 DIAGNOSIS — G35 Multiple sclerosis: Secondary | ICD-10-CM | POA: Diagnosis not present

## 2022-06-08 MED ORDER — SODIUM CHLORIDE 0.9 % IV SOLN: INTRAVENOUS | Status: DC | PRN

## 2022-06-08 MED ORDER — SODIUM CHLORIDE 0.9 % IV SOLN
300.0000 mg | INTRAVENOUS | Status: DC
  Administered 2022-06-08: 300 mg via INTRAVENOUS
  Filled 2022-06-08: qty 15

## 2022-06-08 NOTE — Telephone Encounter (Addendum)
Took call from [place]. Reiterated that she will need to make payment towards balance in order to schedule appt. Pt states bill on file old and from [place]. She claims she was never sent a bill to know she had a balance. Declines making payment. Stating we are denying care/refills. Advised Dr. Epimenio Foot has not discharged her or advised that he can no longer follow her. We are just stating billing issue will need to be resolved before appt can be scheduled. She states she will file complaint with Medicare/Medicaid. Advised we are happy to get her scheduled but she would need to resolve billing issue.   I asked what refill she has not been able to get. She states phentermine. I reviewed chart and advised we just got refill request today for this. She last refilled 05/08/22. Aware I will send to Dr. Terrace Arabia to refill for her.   She had last Tysabri infusion today 06/08/22.  She also states she had trouble getting through to me since 2pm. I asked who she spoke w/. She did not mention a particular person. Said she had to go through referrals. I apologized but did recommend if she has trouble getting through on phones, she also has option to use mychart to send [employer]. She states she does not like using mychart.  Aware we will follow  up with her next week after we discuss billing issue with management/billing and Dr. Epimenio Foot. Aware Dr. Epimenio Foot out until Tuesday next week.

## 2022-06-08 NOTE — Telephone Encounter (Signed)
Last refilled 05/08/22 #30 per drug registry.

## 2022-06-08 NOTE — Progress Notes (Signed)
PATIENT CARE CENTER NOTE     Diagnosis: Multiple sclerosis ([facility]) [G35]      Provider: Despina Arias, MD     Procedure: Tysabri infusion      Note: Patient received Tysabri 300 mg infusion (dose # 3 of 3) via PIV. Patient tolerated infusion well with no adverse reaction. Observed patient for 1 hour post infusion. Vital signs stable. AVS offered but patient refused. Patient alert, oriented and ambulatory at discharge.

## 2022-06-08 NOTE — Telephone Encounter (Signed)
Patient called in to schedule a follow up appointment. She was at [facility] for her infusion and needed to let them know when her follow up with our office was. When going to schedule an appointment, it was noticed that she had bad debt. I checked with billing who advised that she had a large debt balance and would need to make another payment before we could schedule an appointment. She had previously made a $25 payment in order to get a medication refilled.  I advised her of the bad debt and that a payment would need to be made. At that point she repeatedly stated that I was denying her care and that she was going to be filing a major complaint through medicaid and medicare. She advised that this is interfering with her treatment and it interferes with biogen. Also stated that we are keeping her from being able to see her doctor.   I let her know that we were not denying her care, and that I would be more than happy to get her scheduled for an appointment but that she would need to speak with billing or management prior to me scheduling anything as I could not override our policy. I offered to transfer her to our billing department or to [facility], our billing manager, but she did not want to be transferred.   After threatening to file a major complaint again, she asked to speak with Dr Bonnita [place] nurse, Kara Mead. I advised that I would try and get a hold of her in clinic. While waiting to hear back from [facility], she advised multiple times that our office and management was trying to keep her from getting care and that she knew management was the one "behind this". She repeated this multiple times. After a few minutes, I was able to get a hold of Emma in the clinic and was able to get her over to [place].

## 2022-06-12 NOTE — Telephone Encounter (Signed)
Dr. Shela Commons- how would you like to proceed with this patient?

## 2022-06-14 NOTE — Telephone Encounter (Signed)
Tried calling pt at [phone]. Mailbox full, unable to leave message. I was calling pt to try and schedule appt with Dr. Epimenio Foot. Please offer 07/03/22 at 3pm with Dr. Epimenio Foot if she calls back.

## 2022-06-15 NOTE — Telephone Encounter (Signed)
Called pt at [phone]. Scheduled work in appt for 07/03/22 at 3pm with Dr. Epimenio Foot.

## 2022-06-22 ENCOUNTER — Encounter: Payer: Medicaid Other | Admitting: Student

## 2022-06-22 NOTE — Progress Notes (Deleted)
CC: LE swelling  HPI:  Tamara [name] is a 62 y.o. female with past medical history of Pre-DM, OSA, GERD, IBS, MS, neurogenic bowel, sciatica, obesity that presents for LE swelling.    Allergies as of 06/22/2022       Reactions   Codeine Other (See Comments)   Delusions   Dilaudid [hydromorphone Hcl] Swelling, Other (See Comments)   Tongue swells   Ditropan [oxybutynin] Other (See Comments)   Burning sensation   Other Swelling, Other (See Comments)   Unnamed gel or antiseptic solution- Was applied to IV site with a needle- Turned the skin "black and blue" that remained (caused burning and phlebitis, also)   Paxil [paroxetine Hcl] Other (See Comments)   Hallucinations and heavy periods   Tegretol [carbamazepine] Itching   Trileptal [oxcarbazepine] Swelling   Adhesive [tape] Hives, Itching, Rash, Other (See Comments)   PAPER TAPE   Augmentin [amoxicillin-pot Clavulanate] Rash   Gabapentin Rash, Other (See Comments)   Hallucinations and depression, also        Medication List        Accurate as of Jun 22, 2022  7:23 AM. If you have any questions, ask your nurse or doctor.          acetaminophen 650 MG CR tablet Commonly known as: TYLENOL Take 1,300 mg by mouth 3 (three) times daily as needed for pain.   ACIDOPHILUS/PECTIN PO Take by mouth.   ammonium lactate 12 % lotion Commonly known as: AmLactin Apply 1 Application topically as needed for dry skin.   ascorbic acid 500 MG tablet Commonly known as: VITAMIN C Take 1,500 mg by mouth daily.   azithromycin 500 MG tablet Commonly known as: ZITHROMAX Take 1 tablet (500 mg total) by mouth daily.   ciclopirox 8 % solution Commonly known as: Penlac Apply topically at bedtime. Apply over nail and surrounding skin. Apply daily over previous coat. After seven (7) days, may remove with alcohol and continue cycle.   CO-Q 10 OMEGA-3 FISH OIL PO Take 1 capsule by mouth daily.   COLLAGEN PO Take 1 Dose by mouth  every other day. Powder; alternate with pill form.   COLLAGEN PO Take 3 tablets by mouth every other day. Pill; alternate with powder form.   cyanocobalamin 1000 MCG tablet Commonly known as: VITAMIN B12 Take 5,000 mcg by mouth daily. ODT   fluticasone 50 MCG/ACT nasal spray Commonly known as: FLONASE Place 2 sprays into both nostrils daily.   hyoscyamine 0.125 MG tablet Commonly known as: LEVSIN Take 1 tablet (0.125 mg total) by mouth 3 (three) times daily.   ibuprofen 200 MG tablet Commonly known as: ADVIL Take 200 mg by mouth every 6 (six) hours as needed.   loratadine 10 MG tablet Commonly known as: CLARITIN Take 10 mg by mouth daily as needed for allergies or rhinitis.   LORazepam 1 MG tablet Commonly known as: Ativan Take 1 tablet (1 mg total) by mouth once as needed for up to 1 dose for sedation (before MRI).   magnesium citrate Soln Take 1 Bottle by mouth once.   MINOXIDIL PO Take by mouth.   natalizumab 300 MG/15ML injection Commonly known as: TYSABRI Inject 300 mg into the vein every 28 (twenty-eight) days.   Nitrile Gloves Medium Misc Use prn for bowel incontinence   ONE-A-DAY WOMENS PO Take 1 tablet by mouth daily.   Hair/Skin/Nails Caps Take 1 capsule by mouth in the morning, at noon, and at bedtime.   pantoprazole 40  MG tablet Commonly known as: PROTONIX Take 1 tablet (40 mg total) by mouth daily.   phentermine 37.5 MG capsule Take 1 capsule by mouth in the morning   tiZANidine 4 MG tablet Commonly known as: Zanaflex 1/2 to 1 pill po tid What changed:  how much to take how to take this when to take this reasons to take this   topiramate 50 MG tablet Commonly known as: TOPAMAX Take 1 tablet (50 mg total) by mouth 3 (three) times daily.   VITAMIN B-1 PO Take by mouth.   VITAMIN D PO Take 5,000 Units by mouth daily.   Vitamin D3 50 MCG (2000 UT) Tabs Take 5 tablets by mouth daily.   Zinc 50 MG Tabs Take by mouth.          Past Medical History:  Diagnosis Date   Anemia    Bowel obstruction (HCC)    CKD (chronic kidney disease), stage II    based on labs   GERD (gastroesophageal reflux disease)    IBS (irritable bowel syndrome)    at age of 27   Multiple sclerosis (HCC)    RBBB    Sciatica 2009   Vision abnormalities    Review of Systems:  per HPI.   Physical Exam: *** There were no vitals filed for this visit. *** Constitutional: Well-developed, well-nourished, appears comfortable  HENT: Normocephalic and atraumatic.  Eyes: EOM are normal. PERRL.  Neck: Normal range of motion.  Cardiovascular: Regular rate, regular rhythm. No murmurs, rubs, or gallops. Normal radial and PT pulses bilaterally. No LE edema.  Pulmonary: Normal respiratory effort. No wheezes, rales, or rhonchi.   Abdominal: Soft. Non-distended. No tenderness. Normal bowel sounds.  Musculoskeletal: Normal range of motion.     Neurological: Alert and oriented to person, place, and time. Non-focal. Skin: warm and dry.    Assessment & Plan:   LE swelling Patient has a history of lower extremity swelling. Last seen in clinic for this condition in 07/2021. It was discussed during this visit that it we should obtain venous reflux studies given that prior workup for DVT was negative and workup for CHF w/ echo/BNP was WNL in 2021. Patient has tried compression stockings and leg elevation pillow without relief***. Patient denies chest pain, palpitations, SOB, orthopnea***.    Plan: - Continue   2. Pre-DM Patient has a history of Pre-DM. Patient is not currently on any medications for this condition. Patient denies polyuria, polydipsia, fatigue***. A1c was 5.9% 1 year ago. A1c today ***%.    Plan: - Continue dietary modification***   3. MS Patient has a history of MS. Currently followed by neurology with next visit scheduled on 6/3. Current medications include natalizumab 300 mg q28 days. Patient reports that they are *** compliant  with this medication. Patient reports improvement of her symptoms with this treatment***.    Plan: - Continue natalizumab 300 mg q28 days*** - Continue f/u w/ neurology  4.***   Plan: - Continue   5. Health Screening: ***tdap, pap smear, colonoscopy  -  - Medication refill? ***  -    See Encounters Tab for problem based charting.  No problem-specific Assessment & Plan notes found for this encounter.    Patient seen with Dr. Amadeo Garnet

## 2022-07-03 ENCOUNTER — Telehealth: Payer: Self-pay | Admitting: *Deleted

## 2022-07-03 ENCOUNTER — Ambulatory Visit (INDEPENDENT_AMBULATORY_CARE_PROVIDER_SITE_OTHER): Payer: Medicaid Other | Admitting: Neurology

## 2022-07-03 ENCOUNTER — Encounter: Payer: Self-pay | Admitting: Neurology

## 2022-07-03 VITALS — BP 122/72 | HR 64 | Ht 63.0 in | Wt 201.0 lb

## 2022-07-03 DIAGNOSIS — D352 Benign neoplasm of pituitary gland: Secondary | ICD-10-CM

## 2022-07-03 DIAGNOSIS — G35 Multiple sclerosis: Secondary | ICD-10-CM | POA: Diagnosis not present

## 2022-07-03 DIAGNOSIS — R208 Other disturbances of skin sensation: Secondary | ICD-10-CM | POA: Diagnosis not present

## 2022-07-03 DIAGNOSIS — E559 Vitamin D deficiency, unspecified: Secondary | ICD-10-CM | POA: Diagnosis not present

## 2022-07-03 DIAGNOSIS — R55 Syncope and collapse: Secondary | ICD-10-CM

## 2022-07-03 DIAGNOSIS — R269 Unspecified abnormalities of gait and mobility: Secondary | ICD-10-CM | POA: Diagnosis not present

## 2022-07-03 DIAGNOSIS — Z79899 Other long term (current) drug therapy: Secondary | ICD-10-CM

## 2022-07-03 NOTE — Patient Instructions (Signed)
Change Tylenol to one pill (650 mg) three times a day.  Can take same time as topiramate

## 2022-07-03 NOTE — Telephone Encounter (Signed)
Placed JCV lab in [facility] lock box for routine lab pick up. Results pending. 

## 2022-07-03 NOTE — Progress Notes (Signed)
[facility]  PATIENT: Tamara [name] DOB: April 15, 1960  REFERRING DOCTOR OR PCP:  Nadean Corwin SOURCE: Patient, notes from emergency room and recent hospital stay ([facility]), imaging and lab reports, MRI images on PACS.  _________________________________   HISTORICAL  CHIEF COMPLAINT:  Chief Complaint  Patient presents with   Room 10    Pt is here Alone. Pt states that things haven't been good. Pt states that she has been having a lot of headaches lately. Pt states that her memory has been foggy.     HISTORY OF PRESENT ILLNESS:  Tamara [name] Is a 62 y.o. woman with relapsing remitting multiple sclerosis in May 2018 after she presented with transverse myelitis.   Update 07/03/2022 She is on Tysabri and tolerates it well.  Next infusion is 07/06/2022.   She is tolerating the infusions well  JCV Ab 12/01/2021 was 0.21 negative.  She has no exacerbation.   She has reduced gait and balance.Left > right arm numbness and tingling is still present but it is les painful than last year.    She could not tolerate gabapentin, Lyrica, imipramine, lamotrigine and oxcarbazepine and carbamazapine in the past Lacosamide did  not help.   She had ankle swelling with nortriptyline.  NCV/EMG was fine in the past so sensory symptoms likely from spinal cord plaques.     She has cramping and stiffness of her hands.   Tizanidine works better for her than baclofen.  It causes mild sleepiness so she takes just 12 2-3 times a day.    She is on topiramate with Tylenol for headaches with benefit.   We discussed cutting the Tylenol down to 625 tid rather than 1300 mg tid.     She has fatigue.  She sleeps poorly at night.  She still takes phentermine in the morning    She has had a vit D deficiency and takes D3.     She also has a pituitary adenoma that has been stable on MR  She reports syncope (lightheaded first) x 2. She notes not drinking much liquids those 2 days and feeling she was dehydrated.     She felt a little confused for a few minutes afterwards.    She has more edema at ankles and wears support hose with benefit.   Her husband passed away March 26, 2021.      MS History:   On 06/10/16, she had the onset of numbness and clumsiness in her legs.   As the day went on, her hands also became numb and clumsy.  She felt numb from her waist down.    She went to the [facility] and had an xtay of her back and was referred to orthopedics     Two days later, she went to the ED when her symptoms worsened and she was seen and discharged.    She saw her internist who ordered a lumbar MRI.  The lumbar MRI showed degenerative changes that were mild at L4-L5 and the study was otherwise fairly normal.   She was referred to the ED and had an MRI of the brain, cervical spine and thoracic spine.    She was found to have a transverse myelitis in the cervical spine with an enhancing lesion in the posterior columns. There was also a smaller non-enhancing focus at C5   She also had 5-6 spots in her brain, the largest being periventricular on the right.  She was admitted to [facility] and had 3  days of IV Solumedrol followed by an oral taper.    She was transferred to rehabilitation and was discharged 5/30.   She walked out and felt much better.   Additionally, while she was in the hospital (06/15/2016) she had a lumbar puncture. The CSF was abnormal showing showing the presence  4 oligoclonal bands and an elevated IgG index of 0.9 (less than 0.7 is normal). The myelin basic protein was mildly elevated at 2.2.      She had another exacerbation after returning home with left leg dragging and feeling more off balance with stumbling.   In retrospect, in 2017, she had an episode lasting a few weeks where she felt she was dragging her left leg some. This completely resolved and she did not have any other symptoms such as numbness at that time.   MRIs of the brain, cervical spine, thoracic spine and lumbar spine were  personally reviewed. The MRI of the cervical spine shows an enhancing lesion posterior spinal cord adjacent to C2-C3. Additionally there is a nonenhancing focus adjacent to C5. In the brain, there are several T2/FLAIR hyperintense foci and the largest is in the right parietal lobe and there are 4 or 5 other small foci, one in the periventricular white matter of the right frontal lobe and the rest in the subcortical white matter.    Laboratory studies show 4 oligoclonal bands and an elevated IgG index of 0.9. Myelin basic protein was mildly elevated at 2.2. She had elevated glucose between 120 and 140 several times while getting steroids.   Her Vit D was mildly low (25.6) and she just started OTC supplementation.   She started Tysabri in July 2018  IMAGING  MRIs of the brain and cervical spine 07/16/2019 compared to the 2019 brain and 2018 cervical spine .  There are no new lesions. She has old cervical spine lesions at C3 and C5.  The brain shows multiple chronic foci in the hemispheres.  None in brainstem or cerebellum.  No focus enhances.    MRI cervical spie 09/24/2020 showed Stable chronic demyelinating plaques at C3 and C5 levels     REVIEW OF SYSTEMS: Constitutional: No fevers, chills, sweats, or change in appetite Eyes: No visual changes, double vision, eye pain Ear, nose and throat: No hearing loss, ear pain, nasal congestion, sore throat Cardiovascular: No chest pain, palpitations Respiratory:  No shortness of breath at rest or with exertion.   No wheezes GastrointestinaI: No nausea, vomiting, diarrhea, abdominal pain, fecal incontinence Genitourinary:  No dysuria, urinary retention or frequency.  No nocturia. Musculoskeletal:  No neck pain, back pain Integumentary: No rash, pruritus, skin lesions Neurological: as above Psychiatric: No depression at this time.  No anxiety Endocrine: No palpitations, diaphoresis, change in appetite, change in weigh or increased  thirst Hematologic/Lymphatic:  No anemia, purpura, petechiae. Allergic/Immunologic: No itchy/runny eyes, nasal congestion, recent allergic reactions, rashes  ALLERGIES: Allergies  Allergen Reactions   Codeine Other (See Comments)    Delusions   Dilaudid [Hydromorphone Hcl] Swelling and Other (See Comments)    Tongue swells    Ditropan [Oxybutynin] Other (See Comments)    Burning sensation   Other Swelling and Other (See Comments)    Unnamed gel or antiseptic solution- Was applied to IV site with a needle- Turned the skin "black and blue" that remained (caused burning and phlebitis, also)   Paxil [Paroxetine Hcl] Other (See Comments)    Hallucinations and heavy periods    Tegretol [Carbamazepine] Itching  Trileptal [Oxcarbazepine] Swelling   Adhesive [Tape] Hives, Itching, Rash and Other (See Comments)    PAPER TAPE   Augmentin [Amoxicillin-Pot Clavulanate] Rash   Gabapentin Rash and Other (See Comments)    Hallucinations and depression, also    HOME MEDICATIONS:  Current Outpatient Medications:    acetaminophen (TYLENOL) 650 MG CR tablet, Take 1,300 mg by mouth 3 (three) times daily as needed for pain., Disp: , Rfl:    ammonium lactate (AMLACTIN) 12 % lotion, Apply 1 Application topically as needed for dry skin., Disp: 400 g, Rfl: 0   ascorbic acid (VITAMIN C) 500 MG tablet, Take 1,500 mg by mouth daily., Disp: , Rfl:    azithromycin (ZITHROMAX) 500 MG tablet, Take 1 tablet (500 mg total) by mouth daily. (Patient not taking: Reported on 03/21/2022), Disp: 3 tablet, Rfl: 0   Cholecalciferol (VITAMIN D3) 50 MCG (2000 UT) TABS, Take 5 tablets by mouth daily., Disp: , Rfl:    ciclopirox (PENLAC) 8 % solution, Apply topically at bedtime. Apply over nail and surrounding skin. Apply daily over previous coat. After seven (7) days, may remove with alcohol and continue cycle., Disp: 6.6 mL, Rfl: 2   Coenzyme Q10-Fish Oil-Vit E (CO-Q 10 OMEGA-3 FISH OIL PO), Take 1 capsule by mouth daily.,  Disp: , Rfl:    COLLAGEN PO, Take 1 Dose by mouth every other day. Powder; alternate with pill form. (Patient not taking: Reported on 03/21/2022), Disp: , Rfl:    COLLAGEN PO, Take 3 tablets by mouth every other day. Pill; alternate with powder form., Disp: , Rfl:    cyanocobalamin (VITAMIN B12) 1000 MCG tablet, Take 5,000 mcg by mouth daily. ODT, Disp: , Rfl:    Disposable Gloves (NITRILE GLOVES MEDIUM) MISC, Use prn for bowel incontinence (Patient not taking: Reported on 01/16/2022), Disp: 100 each, Rfl: 5   fluticasone (FLONASE) 50 MCG/ACT nasal spray, Place 2 sprays into both nostrils daily., Disp: 16 g, Rfl: 2   hyoscyamine (LEVSIN) 0.125 MG tablet, Take 1 tablet (0.125 mg total) by mouth 3 (three) times daily., Disp: 90 tablet, Rfl: 5   ibuprofen (ADVIL) 200 MG tablet, Take 200 mg by mouth every 6 (six) hours as needed., Disp: , Rfl:    Lactobacillus (ACIDOPHILUS/PECTIN PO), Take by mouth., Disp: , Rfl:    loratadine (CLARITIN) 10 MG tablet, Take 10 mg by mouth daily as needed for allergies or rhinitis., Disp: , Rfl:    LORazepam (ATIVAN) 1 MG tablet, Take 1 tablet (1 mg total) by mouth once as needed for up to 1 dose for sedation (before MRI). (Patient not taking: Reported on 01/16/2022), Disp: 2 tablet, Rfl: 0   magnesium citrate SOLN, Take 1 Bottle by mouth once., Disp: , Rfl:    MINOXIDIL PO, Take by mouth. (Patient not taking: Reported on 01/16/2022), Disp: , Rfl:    Multiple Vitamins-Minerals (HAIR/SKIN/NAILS) CAPS, Take 1 capsule by mouth in the morning, at noon, and at bedtime., Disp: , Rfl:    Multiple Vitamins-Minerals (ONE-A-DAY WOMENS PO), Take 1 tablet by mouth daily., Disp: , Rfl:    natalizumab (TYSABRI) 300 MG/15ML injection, Inject 300 mg into the vein every 28 (twenty-eight) days., Disp: , Rfl:    pantoprazole (PROTONIX) 40 MG tablet, Take 1 tablet (40 mg total) by mouth daily., Disp: 90 tablet, Rfl: 0   phentermine 37.5 MG capsule, Take 1 capsule by mouth in the morning,  Disp: 30 capsule, Rfl: 0   Thiamine HCl (VITAMIN B-1 PO), Take by mouth., Disp: ,  Rfl:    tiZANidine (ZANAFLEX) 4 MG tablet, 1/2 to 1 pill po tid (Patient taking differently: Take 2-4 mg by mouth 3 (three) times daily as needed for muscle spasms. 1/2 to 1 pill po tid), Disp: 90 tablet, Rfl: 5   topiramate (TOPAMAX) 50 MG tablet, Take 1 tablet (50 mg total) by mouth 3 (three) times daily., Disp: 90 tablet, Rfl: 11   VITAMIN D PO, Take 5,000 Units by mouth daily., Disp: , Rfl:    Zinc 50 MG TABS, Take by mouth., Disp: , Rfl:   PAST MEDICAL HISTORY: Past Medical History:  Diagnosis Date   Anemia    Bowel obstruction (HCC)    CKD (chronic kidney disease), stage II    based on labs   GERD (gastroesophageal reflux disease)    IBS (irritable bowel syndrome)    at age of 37   Multiple sclerosis (HCC)    RBBB    Sciatica 2009   Vision abnormalities     PAST SURGICAL HISTORY: Past Surgical History:  Procedure Laterality Date   ABDOMINAL HYSTERECTOMY  2008   cervix and right ovary still intact   KNEE ARTHROSCOPY  2010 and 2011   Left knee, x2    FAMILY HISTORY: Family History  Problem Relation Age of Onset   Cancer Father        Gallbladder   Gallbladder disease Father    Hypertension Brother    Gallbladder disease Paternal Grandmother    Colon cancer Neg Hx    Colon polyps Neg Hx    Esophageal cancer Neg Hx    Kidney disease Neg Hx     SOCIAL HISTORY:  Social History   Socioeconomic History   Marital status: Widowed    Spouse name: Not on file   Number of children: 2   Years of education: Not on file   Highest education level: Not on file  Occupational History   Occupation: Science writer  Tobacco Use   Smoking status: Never   Smokeless tobacco: Never  Vaping Use   Vaping Use: Never used  Substance and Sexual Activity   Alcohol use: No    Alcohol/week: 0.0 standard drinks of alcohol   Drug use: No   Sexual activity: Not on file  Other Topics Concern    Not on file  Social History Narrative   Not on file   Social Determinants of Health   Financial Resource Strain: Not on file  Food Insecurity: No Food Insecurity (03/21/2022)   Hunger Vital Sign    Worried About Running Out of Food in the Last Year: Never true    Ran Out of Food in the Last Year: Never true  Transportation Needs: No Transportation Needs (01/16/2022)   PRAPARE - Administrator, Civil Service (Medical): No    Lack of Transportation (Non-Medical): No  Physical Activity: Insufficiently Active (01/16/2022)   Exercise Vital Sign    Days of Exercise per Week: 7 days    Minutes of Exercise per Session: 20 min  Stress: Not on file  Social Connections: Not on file  Intimate Partner Violence: Not on file     PHYSICAL EXAM  There were no vitals filed for this visit.   There is no height or weight on file to calculate BMI.   General: The patient is well-developed and well-nourished and in no acute distress   Neurologic Exam  Mental status: The patient is alert and oriented x 3 at the time of the examination.  The patient has apparent normal recent and remote memory, with an apparently normal attention span and concentration ability.   Speech is normal.  Cranial nerves: Extraocular movements are full. Facial strength and sensation is normal. Trapezius strength is normal.. Hearing appears to be symmetric.  Motor:  Muscle bulk is normal.   Muscle tone is normal. Strength is 5/5 except 4+/5 hip flexion (iliopsoas).      Sensory:   She had normal sensation to vibration in the arms and legs.  She reported some altered sensation in the left hand compared to the right.    Coordination: Cerebellar testing reveals good finger-nose-finger and mild reduced left heel-to-shin bilaterally.  Gait and station: Station is normal.  Gait is wide.  Tandem gait is mildly wide. Left foot drop was minimal today.  Romberg is negative.    Reflexes:  . Deep tendon reflexes are 3  and symmetric in the knees and ankles and 2 and symmetric in the arms.      DIAGNOSTIC DATA (LABS, IMAGING, TESTING) - I reviewed patient records, labs, notes, testing and imaging myself where available.  Lab Results  Component Value Date   WBC 6.7 03/16/2022   HGB 12.5 03/16/2022   HCT 38.5 03/16/2022   MCV 89 03/16/2022   PLT 154 03/16/2022      Component Value Date/Time   NA 143 03/16/2022 1155   K 4.4 03/16/2022 1155   CL 108 (H) 03/16/2022 1155   CO2 21 03/16/2022 1155   GLUCOSE 96 03/16/2022 1155   GLUCOSE 94 09/07/2020 1405   BUN 14 03/16/2022 1155   CREATININE 1.07 (H) 03/16/2022 1155   CREATININE 1.01 07/08/2013 1440   CALCIUM 9.1 03/16/2022 1155   PROT 6.4 03/16/2022 1155   ALBUMIN 4.6 03/16/2022 1155   AST 19 03/16/2022 1155   ALT 23 03/16/2022 1155   ALKPHOS 92 03/16/2022 1155   BILITOT <0.2 03/16/2022 1155   GFRNONAA >60 09/07/2020 1405   GFRNONAA 64 07/08/2013 1440   GFRAA >60 07/10/2019 0519   GFRAA 74 07/08/2013 1440   Lab Results  Component Value Date   CHOL 200 (H) 03/31/2021   HDL 66 03/31/2021   LDLCALC 123 (H) 03/31/2021   TRIG 63 03/31/2021   CHOLHDL 3.0 03/31/2021   Lab Results  Component Value Date   HGBA1C 5.9 (H) 03/31/2021   Lab Results  Component Value Date   VITAMINB12 1,059 03/22/2020   Lab Results  Component Value Date   TSH 0.351 06/15/2016       ASSESSMENT AND PLAN  Multiple sclerosis (HCC)  High risk medication use  Dysesthesia  Gait disturbance  Vitamin D deficiency   1.   Continue Tysabri..    Check JCV Ab today (negative 6 months ago).    MRi brain for MS and pituitary followup 2.   She will continue phentermine for fatigue/weight and Topamax for migraine 3.   Due to combination of physical and cognitive/fatigue issues, she is disabled and unable to work.  4.   Vit D supplementation.    5.   Continue tizanidine 2 mg po tid for spasticity.   Continue TPM 50 mg po tid and Tylenol CR 650 mg po tid for  migraines 6.   She had syncope x 2 - check EEG Rtc 6 months  Michele Kerlin A. Epimenio Foot, MD, PhD, [place]  07/03/2022, 3:20 PM Certified in Neurology, Clinical Neurophysiology, Sleep Medicine, Pain Medicine and Neuroimaging Dir., [facility]  [facility] [phone]  [address] [place], [place] [address] [phone]

## 2022-07-04 ENCOUNTER — Telehealth: Payer: Self-pay | Admitting: Neurology

## 2022-07-04 LAB — CBC WITH DIFFERENTIAL/PLATELET
Basophils Absolute: 0 10*3/uL (ref 0.0–0.2)
Basos: 0 %
EOS (ABSOLUTE): 0.1 10*3/uL (ref 0.0–0.4)
Eos: 1 %
Hematocrit: 35.3 % (ref 34.0–46.6)
Hemoglobin: 11.3 g/dL (ref 11.1–15.9)
Immature Grans (Abs): 0 10*3/uL (ref 0.0–0.1)
Immature Granulocytes: 1 %
Lymphocytes Absolute: 2.6 10*3/uL (ref 0.7–3.1)
Lymphs: 43 %
MCH: 28 pg (ref 26.6–33.0)
MCHC: 32 g/dL (ref 31.5–35.7)
MCV: 88 fL (ref 79–97)
Monocytes Absolute: 0.5 10*3/uL (ref 0.1–0.9)
Monocytes: 8 %
NRBC: 1 % — ABNORMAL HIGH (ref 0–0)
Neutrophils Absolute: 2.9 10*3/uL (ref 1.4–7.0)
Neutrophils: 47 %
Platelets: 166 10*3/uL (ref 150–450)
RBC: 4.03 x10E6/uL (ref 3.77–5.28)
RDW: 14.4 % (ref 11.7–15.4)
WBC: 6 10*3/uL (ref 3.4–10.8)

## 2022-07-04 LAB — COMPREHENSIVE METABOLIC PANEL
ALT: 14 IU/L (ref 0–32)
AST: 14 IU/L (ref 0–40)
Albumin/Globulin Ratio: 2.3 — ABNORMAL HIGH (ref 1.2–2.2)
Albumin: 4.4 g/dL (ref 3.9–4.9)
Alkaline Phosphatase: 82 IU/L (ref 44–121)
BUN/Creatinine Ratio: 17 (ref 12–28)
BUN: 19 mg/dL (ref 8–27)
Bilirubin Total: <0.2 mg/dL (ref 0.0–1.2)
CO2: 24 mmol/L (ref 20–29)
Calcium: 9.6 mg/dL (ref 8.7–10.3)
Chloride: 108 mmol/L — ABNORMAL HIGH (ref 96–106)
Creatinine, Ser: 1.14 mg/dL — ABNORMAL HIGH (ref 0.57–1.00)
Globulin, Total: 1.9 g/dL (ref 1.5–4.5)
Glucose: 88 mg/dL (ref 70–99)
Potassium: 4 mmol/L (ref 3.5–5.2)
Sodium: 144 mmol/L (ref 134–144)
Total Protein: 6.3 g/dL (ref 6.0–8.5)
eGFR: 55 mL/min/{1.73_m2} — ABNORMAL LOW (ref >59)

## 2022-07-04 LAB — TSH: TSH: 2.78 u[IU]/mL (ref 0.450–4.500)

## 2022-07-04 NOTE — Telephone Encounter (Signed)
[employer] medicaid Berkley Harvey: [id number] exp. 07/04/22-08/18/22 sent to GI [phone]

## 2022-07-05 ENCOUNTER — Other Ambulatory Visit: Payer: Self-pay | Admitting: *Deleted

## 2022-07-05 DIAGNOSIS — G35 Multiple sclerosis: Secondary | ICD-10-CM

## 2022-07-06 ENCOUNTER — Non-Acute Institutional Stay (HOSPITAL_COMMUNITY)
Admission: RE | Admit: 2022-07-06 | Discharge: 2022-07-06 | Disposition: A | Discharge status: Home / Self Care | Payer: Medicaid Other | Source: Ambulatory Visit | Attending: Internal Medicine | Admitting: Internal Medicine

## 2022-07-06 DIAGNOSIS — G35 Multiple sclerosis: Secondary | ICD-10-CM | POA: Diagnosis present

## 2022-07-06 MED ORDER — SODIUM CHLORIDE 0.9 % IV SOLN: INTRAVENOUS | Status: DC | PRN

## 2022-07-06 MED ORDER — SODIUM CHLORIDE 0.9 % IV SOLN
300.0000 mg | INTRAVENOUS | Status: DC
  Administered 2022-07-06: 300 mg via INTRAVENOUS
  Filled 2022-07-06: qty 15

## 2022-07-06 NOTE — Progress Notes (Signed)
PATIENT CARE CENTER NOTE     Diagnosis: Multiple sclerosis (HCC) [G35]      Provider: Despina Arias, MD     Procedure: Tysabri infusion      Note: Patient received Tysabri 300 mg infusion (dose # 1 of 5) via PIV. Patient reports swelling of bilateral lower extremities and headache x 1 week. Patient had appointment with neurologist this week and they are aware of patient's symptoms. Neurologist advised patient to follow-up with PCP. Patient tolerated infusion well with no adverse reaction. Observed patient for 30 minutes post infusion. Vital signs stable. AVS offered but patient refused. Patient alert, oriented and ambulatory at discharge.

## 2022-07-07 ENCOUNTER — Encounter: Payer: Self-pay | Admitting: Student

## 2022-07-07 ENCOUNTER — Encounter: Payer: Self-pay | Admitting: Cardiovascular Disease

## 2022-07-07 ENCOUNTER — Ambulatory Visit (INDEPENDENT_AMBULATORY_CARE_PROVIDER_SITE_OTHER): Payer: Medicaid Other | Admitting: Student

## 2022-07-07 VITALS — BP 120/82 | HR 86 | Temp 98.6°F | Wt 201.0 lb

## 2022-07-07 DIAGNOSIS — R600 Localized edema: Secondary | ICD-10-CM

## 2022-07-07 DIAGNOSIS — R7303 Prediabetes: Secondary | ICD-10-CM | POA: Diagnosis not present

## 2022-07-07 NOTE — Patient Instructions (Signed)
Thank you so much for coming to the clinic today!   I think your swelling may be due to the MS medication that you are on. In the meanwhile, I recommend you get those compression stockings as soon as you can which can help with the swelling. Please follow up with your neurologist.   If you have any questions please feel free to the call the clinic at anytime at [phone]. It was a pleasure seeing you!  Best, Dr. Thomasene Ripple

## 2022-07-07 NOTE — Telephone Encounter (Signed)
Call to patient.  Ask for callers name and noted Doctors office.  States person cannot take call .  Sent patient a My Chart message to call office

## 2022-07-09 DIAGNOSIS — R600 Localized edema: Secondary | ICD-10-CM | POA: Insufficient documentation

## 2022-07-09 NOTE — Assessment & Plan Note (Signed)
Pt presents with increased bilateral lower extremity edema since 5/24. She states that the back of her legs got hit with a rope, and since then she has had worsening edema. Normally, she does have swelling and has had it for a while, but she states this is much worse due to the size and pain associated with it. She does wear compression stockings, however seems that they are very loose on her. She states that when she raises her legs in the air for about an hour the swelling resolves.   Per chart review, last echo was within normal limits. Did not appreciate any crackles or fluid on her lungs, and she had a venous reflex study done last year which did not show any abnormality.   On my exam, there is +2 pitting edema of her lower extremities bilaterally.   Do not believe that she has new heart failure, respiratory status not compromised, and nephrotic symptom not likely. Pt is on Tysarbi for her multiple sclerosis followed by neuro, this may be side effect of this medication, and is slowly worsening. Informed pt to talk to her neurologist.   Plan:  - Discussed leg elevation - Recommended pt wear tighter compression stockings (she states she will order as she has her sizes)  - Recommended pt discuss Tysarbi medication with neurologist and weigh risks and benefits.

## 2022-07-09 NOTE — Progress Notes (Signed)
CC: Lower extremity swelling  HPI:  [name] is a 62 y.o. female living with a history stated below and presents today for lower extremity swelling. Please see problem based assessment and plan for additional details.  Past Medical History:  Diagnosis Date   Anemia    Bowel obstruction (HCC)    CKD (chronic kidney disease), stage II    based on labs   GERD (gastroesophageal reflux disease)    IBS (irritable bowel syndrome)    at age of 49   Multiple sclerosis (HCC)    RBBB    Sciatica 2009   Vision abnormalities     Current Outpatient Medications on File Prior to Visit  Medication Sig Dispense Refill   acetaminophen (TYLENOL) 650 MG CR tablet Take 1,300 mg by mouth 3 (three) times daily as needed for pain.     ammonium lactate (AMLACTIN) 12 % lotion Apply 1 Application topically as needed for dry skin. 400 g 0   ascorbic acid (VITAMIN C) 500 MG tablet Take 1,500 mg by mouth daily.     azithromycin (ZITHROMAX) 500 MG tablet Take 1 tablet (500 mg total) by mouth daily. (Patient not taking: Reported on 03/21/2022) 3 tablet 0   Cholecalciferol (VITAMIN D3) 50 MCG (2000 UT) TABS Take 5 tablets by mouth daily.     ciclopirox (PENLAC) 8 % solution Apply topically at bedtime. Apply over nail and surrounding skin. Apply daily over previous coat. After seven (7) days, may remove with alcohol and continue cycle. 6.6 mL 2   Coenzyme Q10-Fish Oil-Vit E (CO-Q 10 OMEGA-3 FISH OIL PO) Take 1 capsule by mouth daily.     COLLAGEN PO Take 1 Dose by mouth every other day. Powder; alternate with pill form. (Patient not taking: Reported on 03/21/2022)     COLLAGEN PO Take 3 tablets by mouth every other day. Pill; alternate with powder form.     cyanocobalamin (VITAMIN B12) 1000 MCG tablet Take 5,000 mcg by mouth daily. ODT     Disposable Gloves (NITRILE GLOVES MEDIUM) MISC Use prn for bowel incontinence (Patient not taking: Reported on 01/16/2022) 100 each 5   Ferrous Sulfate (IRON PO) Take by  mouth.     fluticasone (FLONASE) 50 MCG/ACT nasal spray Place 2 sprays into both nostrils daily. 16 g 2   hyoscyamine (LEVSIN) 0.125 MG tablet Take 1 tablet (0.125 mg total) by mouth 3 (three) times daily. 90 tablet 5   ibuprofen (ADVIL) 200 MG tablet Take 200 mg by mouth every 6 (six) hours as needed.     Lactobacillus (ACIDOPHILUS/PECTIN PO) Take by mouth.     loratadine (CLARITIN) 10 MG tablet Take 10 mg by mouth daily as needed for allergies or rhinitis.     LORazepam (ATIVAN) 1 MG tablet Take 1 tablet (1 mg total) by mouth once as needed for up to 1 dose for sedation (before MRI). (Patient not taking: Reported on 01/16/2022) 2 tablet 0   magnesium citrate SOLN Take 1 Bottle by mouth once.     MINOXIDIL PO Take by mouth. (Patient not taking: Reported on 01/16/2022)     Multiple Vitamins-Minerals (HAIR/SKIN/NAILS) CAPS Take 1 capsule by mouth in the morning, at noon, and at bedtime.     Multiple Vitamins-Minerals (ONE-A-DAY WOMENS PO) Take 1 tablet by mouth daily.     natalizumab (TYSABRI) 300 MG/15ML injection Inject 300 mg into the vein every 28 (twenty-eight) days.     pantoprazole (PROTONIX) 40 MG tablet Take 1 tablet (40  mg total) by mouth daily. 90 tablet 0   phentermine 37.5 MG capsule Take 1 capsule by mouth in the morning 30 capsule 0   POTASSIUM BICARBONATE PO Take by mouth.     Thiamine HCl (VITAMIN B-1 PO) Take by mouth.     tiZANidine (ZANAFLEX) 4 MG tablet 1/2 to 1 pill po tid (Patient taking differently: Take 2-4 mg by mouth 3 (three) times daily as needed for muscle spasms. 1/2 to 1 pill po tid) 90 tablet 5   topiramate (TOPAMAX) 50 MG tablet Take 1 tablet (50 mg total) by mouth 3 (three) times daily. 90 tablet 11   VITAMIN D PO Take 5,000 Units by mouth daily.     Zinc 50 MG TABS Take by mouth.     No current facility-administered medications on file prior to visit.    Family History  Problem Relation Age of Onset   Cancer Father        Gallbladder   Gallbladder  disease Father    Hypertension Brother    Gallbladder disease Paternal Grandmother    Colon cancer Neg Hx    Colon polyps Neg Hx    Esophageal cancer Neg Hx    Kidney disease Neg Hx     Social History   Socioeconomic History   Marital status: Widowed    Spouse name: Not on file   Number of children: 2   Years of education: Not on file   Highest education level: Not on file  Occupational History   Occupation: Science writer  Tobacco Use   Smoking status: Never   Smokeless tobacco: Never  Vaping Use   Vaping Use: Never used  Substance and Sexual Activity   Alcohol use: No    Alcohol/week: 0.0 standard drinks of alcohol   Drug use: No   Sexual activity: Not on file  Other Topics Concern   Not on file  Social History Narrative   Not on file   Social Determinants of Health   Financial Resource Strain: Not on file  Food Insecurity: No Food Insecurity (03/21/2022)   Hunger Vital Sign    Worried About Running Out of Food in the Last Year: Never true    Ran Out of Food in the Last Year: Never true  Transportation Needs: No Transportation Needs (01/16/2022)   PRAPARE - Administrator, Civil Service (Medical): No    Lack of Transportation (Non-Medical): No  Physical Activity: Insufficiently Active (01/16/2022)   Exercise Vital Sign    Days of Exercise per Week: 7 days    Minutes of Exercise per Session: 20 min  Stress: Not on file  Social Connections: Not on file  Intimate Partner Violence: Not on file    Review of Systems: ROS negative except for what is noted on the assessment and plan.  Vitals:   07/07/22 1024  BP: 120/82  Pulse: 86  Temp: 98.6 F (37 C)  TempSrc: Oral  SpO2: 100%  Weight: 201 lb (91.2 kg)    Physical Exam: Constitutional: well-appearing female  in no acute distress Neck: supple, no JVD appreciated Cardiovascular: regular rate and rhythm, no m/r/g, +2 pitting edema in lower extremities bilaterally Pulmonary/Chest: normal  work of breathing on room air, lungs clear to auscultation bilaterally, no crackles, rales, or wheezes Abdominal: soft, non-tender, non-distended Skin: warm and dry Psych: Anxious mood and affect  Assessment & Plan:   Bilateral lower extremity edema Pt presents with increased bilateral lower extremity edema since 5/24. She  states that the back of her legs got hit with a rope, and since then she has had worsening edema. Normally, she does have swelling and has had it for a while, but she states this is much worse due to the size and pain associated with it. She does wear compression stockings, however seems that they are very loose on her. She states that when she raises her legs in the air for about an hour the swelling resolves.   Per chart review, last echo was within normal limits. Did not appreciate any crackles or fluid on her lungs, and she had a venous reflex study done last year which did not show any abnormality.   On my exam, there is +2 pitting edema of her lower extremities bilaterally.   Do not believe that she has new heart failure, respiratory status not compromised, and nephrotic symptom not likely. Pt is on Tysarbi for her multiple sclerosis followed by neuro, this may be side effect of this medication, and is slowly worsening. Informed pt to talk to her neurologist.   Plan:  - Discussed leg elevation - Recommended pt wear tighter compression stockings (she states she will order as she has her sizes)  - Recommended pt discuss Tysarbi medication with neurologist and weigh risks and benefits.    Patient discussed with Dr. Kae Heller Philisha Weinel, M.D. [facility] Internal Medicine, PGY-1 Pager: [phone] Date 07/09/2022 Time 2:28 PM

## 2022-07-10 NOTE — Telephone Encounter (Signed)
JCV ab drawn on 07/03/22 negative, index: 0.16.

## 2022-07-11 NOTE — Progress Notes (Signed)
Internal Medicine Clinic Attending  Case discussed with Dr. Nooruddin  At the time of the visit.  We reviewed the resident's history and exam and pertinent patient test results.  I agree with the assessment, diagnosis, and plan of care documented in the resident's note.  

## 2022-07-12 ENCOUNTER — Ambulatory Visit
Admission: RE | Admit: 2022-07-12 | Discharge: 2022-07-12 | Disposition: A | Discharge status: Home / Self Care | Payer: Medicaid Other | Source: Ambulatory Visit | Attending: Neurology | Admitting: Neurology

## 2022-07-12 DIAGNOSIS — D352 Benign neoplasm of pituitary gland: Secondary | ICD-10-CM

## 2022-07-12 DIAGNOSIS — G35 Multiple sclerosis: Secondary | ICD-10-CM

## 2022-07-12 MED ORDER — GADOPICLENOL 0.5 MMOL/ML IV SOLN
10.0000 mL | Freq: Once | INTRAVENOUS | Status: AC | PRN
  Administered 2022-07-12: 10 mL via INTRAVENOUS

## 2022-07-15 ENCOUNTER — Other Ambulatory Visit: Payer: Self-pay | Admitting: Neurology

## 2022-07-17 MED ORDER — PHENTERMINE HCL 37.5 MG PO CAPS: 37.5000 mg | ORAL_CAPSULE | Freq: Every morning | ORAL | 0 refills | Status: DC

## 2022-07-17 NOTE — Telephone Encounter (Signed)
Requested Prescriptions   Pending Prescriptions Disp Refills   phentermine 37.5 MG capsule 30 capsule 0    Sig: Take 1 capsule (37.5 mg total) by mouth every morning.   Signed Prescriptions Disp Refills   phentermine 37.5 MG capsule 30 capsule 0    Sig: Take 1 capsule (37.5 mg total) by mouth every morning.    Authorizing Provider: Asa Lente    Ordering User: Danne Harbor  I THINK IS CONTROLLED I TRIED TO SEND KEPT PRNTING INSTEAD Last seen 07/03/22, next appt 07/24/22 with suitor  Dispenses   Dispensed Days Supply Quantity Provider Pharmacy  PHENTERMINE 37.5MG   CAP 03/30/2022 30 30 each Levert Feinstein, MD [facility] [phone] ...  PHENTERMINE 37.5MG   CAP 02/14/2022 30 30 each Levert Feinstein, MD [facility] [phone] ...  PHENTERMINE 37.5MG   CAP 01/09/2022 30 30 each Levert Feinstein, MD [facility] [phone] ...  PHENTERMINE 37.5MG   CAP 11/27/2021 30 30 each Dohmeier, Porfirio Mylar, MD [facility] [phone] ...  PHENTERMINE 37.5MG   CAP 10/20/2021 30 30 each Dohmeier, Porfirio Mylar, MD [facility] [phone] ...  PHENTERMINE 37.5MG   CAP 09/11/2021 30 30 each Dohmeier, Porfirio Mylar, MD [facility] [phone] ...  PHENTERMINE 37.5MG   CAP 08/12/2021 30 30 each Dohmeier, Porfirio Mylar, MD [facility] [phone] .Marland KitchenMarland Kitchen

## 2022-07-23 ENCOUNTER — Emergency Department (HOSPITAL_BASED_OUTPATIENT_CLINIC_OR_DEPARTMENT_OTHER): Payer: Medicaid Other

## 2022-07-23 ENCOUNTER — Encounter (HOSPITAL_BASED_OUTPATIENT_CLINIC_OR_DEPARTMENT_OTHER): Payer: Self-pay

## 2022-07-23 ENCOUNTER — Emergency Department (HOSPITAL_BASED_OUTPATIENT_CLINIC_OR_DEPARTMENT_OTHER)
Admission: EM | Admit: 2022-07-23 | Discharge: 2022-07-24 | Disposition: A | Discharge status: Home / Self Care | Payer: Medicaid Other | Attending: Emergency Medicine | Admitting: Emergency Medicine

## 2022-07-23 ENCOUNTER — Other Ambulatory Visit: Payer: Self-pay

## 2022-07-23 DIAGNOSIS — R519 Headache, unspecified: Secondary | ICD-10-CM | POA: Diagnosis not present

## 2022-07-23 DIAGNOSIS — H1131 Conjunctival hemorrhage, right eye: Secondary | ICD-10-CM | POA: Diagnosis not present

## 2022-07-23 DIAGNOSIS — H5711 Ocular pain, right eye: Secondary | ICD-10-CM | POA: Diagnosis present

## 2022-07-23 MED ORDER — FLUORESCEIN SODIUM 1 MG OP STRP
1.0000 | ORAL_STRIP | Freq: Once | OPHTHALMIC | Status: AC
  Administered 2022-07-23: 1 via OPHTHALMIC
  Filled 2022-07-23: qty 1

## 2022-07-23 MED ORDER — TETRACAINE HCL 0.5 % OP SOLN
1.0000 [drp] | Freq: Once | OPHTHALMIC | Status: AC
  Administered 2022-07-23: 1 [drp] via OPHTHALMIC
  Filled 2022-07-23: qty 4

## 2022-07-23 MED ORDER — METOCLOPRAMIDE HCL 5 MG/ML IJ SOLN
10.0000 mg | Freq: Once | INTRAMUSCULAR | Status: AC
  Administered 2022-07-23: 10 mg via INTRAVENOUS
  Filled 2022-07-23: qty 2

## 2022-07-23 MED ORDER — KETOROLAC TROMETHAMINE 15 MG/ML IJ SOLN
15.0000 mg | Freq: Once | INTRAMUSCULAR | Status: AC
  Administered 2022-07-23: 15 mg via INTRAVENOUS
  Filled 2022-07-23: qty 1

## 2022-07-23 MED ORDER — ACETAMINOPHEN 500 MG PO TABS: 1000.0000 mg | ORAL_TABLET | Freq: Once | ORAL | Status: DC

## 2022-07-23 NOTE — ED Triage Notes (Signed)
Patient here POV from Home.  Endorses Onset of Pain about an hour ago to her Right Medial Eye. Some Intermittent Blurriness to Vision and Headache. No Drainage. No Known Trauma.   NAD Noted during Triage. A&Ox4. GCS 15. Ambulatory.

## 2022-07-23 NOTE — ED Provider Notes (Signed)
[place] EMERGENCY DEPARTMENT AT [facility] Provider Note   CSN: [id number] Arrival date & time: 07/23/22  2117     History  Chief Complaint  Patient presents with   Eye Pain    Tamara [name] is a 62 y.o. female.  Patient is a 62 year old female with a past medical history of MS presenting to the emergency department with headache and eye pain.  Patient reports about an hour prior to arrival she was at the grocery store when she had sudden onset of pain in her right eye.  She states that she noticed some mild blurred vision in her right eye that has been coming and going.  She states that she then developed a headache across her forehead.  She reports light sensitivity.  She reported some nausea but denies any vomiting.  She states that she initially felt very dizzy and off balance but states that that resolved.  She denies any numbness or weakness.  She states that she does get frequent headaches from her MS but states that this feels different from her normal MS headaches.  She states that she normally wears glasses but does not wear contacts.  The history is provided by the patient.  Eye Pain       Home Medications Prior to Admission medications   Medication Sig Start Date End Date Taking? Authorizing Provider  acetaminophen (TYLENOL) 650 MG CR tablet Take 1,300 mg by mouth 3 (three) times daily as needed for pain.    [provider]  ammonium lactate (AMLACTIN) 12 % lotion Apply 1 Application topically as needed for dry skin. 08/19/21   Vivi Barrack, DPM  ascorbic acid (VITAMIN C) 500 MG tablet Take 1,500 mg by mouth daily.    [provider]  azithromycin (ZITHROMAX) 500 MG tablet Take 1 tablet (500 mg total) by mouth daily. Patient not taking: Reported on 03/21/2022 03/16/22   [name], DO  Cholecalciferol (VITAMIN D3) 50 MCG (2000 UT) TABS Take 5 tablets by mouth daily.    [provider]  ciclopirox (PENLAC) 8 % solution  Apply topically at bedtime. Apply over nail and surrounding skin. Apply daily over previous coat. After seven (7) days, may remove with alcohol and continue cycle. 11/22/21   Vivi Barrack, DPM  Coenzyme Q10-Fish Oil-Vit E (CO-Q 10 OMEGA-3 FISH OIL PO) Take 1 capsule by mouth daily.    [provider]  COLLAGEN PO Take 1 Dose by mouth every other day. Powder; alternate with pill form. Patient not taking: Reported on 03/21/2022    [provider]  COLLAGEN PO Take 3 tablets by mouth every other day. Pill; alternate with powder form.    [provider]  cyanocobalamin (VITAMIN B12) 1000 MCG tablet Take 5,000 mcg by mouth daily. ODT    [provider]  Disposable Gloves (NITRILE GLOVES MEDIUM) MISC Use prn for bowel incontinence Patient not taking: Reported on 01/16/2022 06/14/20   Sater, Pearletha Furl, MD  Ferrous Sulfate (IRON PO) Take by mouth.    [provider]  fluticasone (FLONASE) 50 MCG/ACT nasal spray Place 2 sprays into both nostrils daily. 12/27/21   Nooruddin, Jason Fila, MD  hyoscyamine (LEVSIN) 0.125 MG tablet Take 1 tablet (0.125 mg total) by mouth 3 (three) times daily. 06/14/20   Sater, Pearletha Furl, MD  ibuprofen (ADVIL) 200 MG tablet Take 200 mg by mouth every 6 (six) hours as needed.    [provider]  Lactobacillus (ACIDOPHILUS/PECTIN PO) Take by  mouth.    [provider]  loratadine (CLARITIN) 10 MG tablet Take 10 mg by mouth daily as needed for allergies or rhinitis.    [provider]  LORazepam (ATIVAN) 1 MG tablet Take 1 tablet (1 mg total) by mouth once as needed for up to 1 dose for sedation (before MRI). Patient not taking: Reported on 01/16/2022 08/04/21   Carlynn Purl C, DO  magnesium citrate SOLN Take 1 Bottle by mouth once.    [provider]  MINOXIDIL PO Take by mouth. Patient not taking: Reported on 01/16/2022    [provider]  Multiple Vitamins-Minerals (HAIR/SKIN/NAILS) CAPS Take 1  capsule by mouth in the morning, at noon, and at bedtime.    [provider]  Multiple Vitamins-Minerals (ONE-A-DAY WOMENS PO) Take 1 tablet by mouth daily.    [provider]  natalizumab (TYSABRI) 300 MG/15ML injection Inject 300 mg into the vein every 28 (twenty-eight) days.    [provider]  pantoprazole (PROTONIX) 40 MG tablet Take 1 tablet (40 mg total) by mouth daily. 05/05/22   Reymundo Poll, MD  phentermine 37.5 MG capsule Take 1 capsule (37.5 mg total) by mouth every morning. 07/17/22   Sater, Pearletha Furl, MD  phentermine 37.5 MG capsule Take 1 capsule (37.5 mg total) by mouth every morning. 07/17/22   Sater, Pearletha Furl, MD  POTASSIUM BICARBONATE PO Take by mouth.    [provider]  Thiamine HCl (VITAMIN B-1 PO) Take by mouth.    [provider]  tiZANidine (ZANAFLEX) 4 MG tablet 1/2 to 1 pill po tid Patient taking differently: Take 2-4 mg by mouth 3 (three) times daily as needed for muscle spasms. 1/2 to 1 pill po tid 06/02/21   Sater, Pearletha Furl, MD  topiramate (TOPAMAX) 50 MG tablet Take 1 tablet (50 mg total) by mouth 3 (three) times daily. 11/17/21   Sater, Pearletha Furl, MD  VITAMIN D PO Take 5,000 Units by mouth daily.    [provider]  Zinc 50 MG TABS Take by mouth.    [provider]      Allergies    Codeine, Dilaudid [hydromorphone hcl], Ditropan [oxybutynin], Other, Paxil [paroxetine hcl], Tegretol [carbamazepine], Trileptal [oxcarbazepine], Adhesive [tape], Augmentin [amoxicillin-pot clavulanate], and Gabapentin    Review of Systems   Review of Systems  Eyes:  Positive for pain.    Physical Exam Updated Vital Signs BP 123/81   Pulse 71   Temp 98.4 F (36.9 C) (Oral)   Resp 16   Ht 5\' 4"  (1.626 m)   Wt 88.5 kg   SpO2 98%   BMI 33.47 kg/m  Physical Exam Vitals and nursing note reviewed.  Constitutional:      General: She is not in acute distress.    Appearance: Normal appearance.  HENT:     Head:  Normocephalic and atraumatic.     Comments: No temporal tenderness to palpation bilaterally Eyes:     General:        Right eye: No foreign body or discharge.        Left eye: No foreign body or discharge.     Intraocular pressure: Right eye pressure is 14 mmHg. Left eye pressure is 15 mmHg.     Extraocular Movements: Extraocular movements intact.     Pupils: Pupils are equal, round, and reactive to light.     Right eye: No corneal abrasion or fluorescein uptake.     Left eye: No corneal abrasion or fluorescein  uptake.      Comments: No nystagmus  Cardiovascular:     Rate and Rhythm: Normal rate and regular rhythm.     Heart sounds: Normal heart sounds.  Pulmonary:     Effort: Pulmonary effort is normal.     Breath sounds: Normal breath sounds.  Abdominal:     General: Abdomen is flat.  Musculoskeletal:        General: Normal range of motion.     Cervical back: Normal range of motion and neck supple.  Skin:    General: Skin is warm and dry.  Neurological:     General: No focal deficit present.     Mental Status: She is alert and oriented to person, place, and time.     Cranial Nerves: No cranial nerve deficit.     Sensory: No sensory deficit.     Motor: No weakness.     Coordination: Coordination normal.  Psychiatric:        Mood and Affect: Mood normal.        Behavior: Behavior normal.     ED Results / Procedures / Treatments   Labs (all labs ordered are listed, but only abnormal results are displayed) Labs Reviewed - No data to display  EKG None  Radiology No results found.  Procedures Ultrasound ED Ocular  Date/Time: 07/23/2022 11:21 PM  Performed by: Rexford Maus, DO Authorized by: Rexford Maus, DO   PROCEDURE DETAILS:    Indications: eye pain     Assessed:  Right eye   Right eye axial view: obtained     Right eye sagittal view: obtained     Images: archived     Limitations:  None RIGHT EYE FINDINGS:     no foreign body noted in  right eye    right eye lens not dislodged    no right eye increased optic nerve sheath diameter    no retrobulbar hematoma in right eye    no evidence of retinal detachment of the right eye    no ruptured globe in right eye    no vitreous hemorrhage in right eye   Optic nerve sheath diameter (mm):  3     Medications Ordered in ED Medications  fluorescein ophthalmic strip 1 strip (1 strip Both Eyes Given by Other 07/23/22 2304)  tetracaine (PONTOCAINE) 0.5 % ophthalmic solution 1 drop (1 drop Both Eyes Given by Other 07/23/22 2305)  ketorolac (TORADOL) 15 MG/ML injection 15 mg (15 mg Intravenous Given 07/23/22 2311)  metoCLOPramide (REGLAN) injection 10 mg (10 mg Intravenous Given 07/23/22 2309)    ED Course/ Medical Decision Making/ A&P Clinical Course as of 07/23/22 2335  Sun Jul 23, 2022  2335 Patient signed out to Dr. Bernette Mayers pending CT and reassessment. [VK]    Clinical Course User Index [VK] Rexford Maus, DO                             Medical Decision Making This patient presents to the ED with chief complaint(s) of eye pain, headache with pertinent past medical history of MS which further complicates the presenting complaint. The complaint involves an extensive differential diagnosis and also carries with it a high risk of complications and morbidity.    The differential diagnosis includes acute angle-closure glaucoma, corneal abrasion or ulceration, tension headache, migraine headache, considering ICH/mass effect with sudden severe headache considering retinal detachment but less likely is and is usually painless, no  focal neurologic deficits making a CVA unlikely  Additional history obtained: Additional history obtained from N/A Records reviewed outpatient neurology records  ED Course and Reassessment: On patient's arrival she is hemodynamically stable in no acute distress without focal neurologic deficits on exam.  Visual acuity was performed which was 20/30 in  the right and 20/20 on the left.  She did have evidence of a subconjunctival hemorrhage on the right medial aspect of her eye.  Eye exam was otherwise normal with negative fluorescein uptake and normal eye pressures.  Bedside ultrasound was performed that showed no evidence of retinal detachment or other abnormality to explain her symptoms.  With her sudden onset of headache she will have a head CT to evaluate for ICH or mass effect.  She will be treated with a migraine cocktail and will be closely reassessed.  Independent labs interpretation:  N/A  Independent visualization of imaging: - Pending     Amount and/or Complexity of Data Reviewed Radiology: ordered.  Risk Prescription drug management.          Final Clinical Impression(s) / ED Diagnoses Final diagnoses:  Subconjunctival hemorrhage of right eye  Acute nonintractable headache, unspecified headache type    Rx / DC Orders ED Discharge Orders     None         Rexford Maus, DO 07/23/22 2335

## 2022-07-24 ENCOUNTER — Other Ambulatory Visit: Payer: Self-pay

## 2022-07-24 ENCOUNTER — Other Ambulatory Visit: Payer: Medicaid Other | Admitting: *Deleted

## 2022-07-24 ENCOUNTER — Telehealth: Payer: Self-pay | Admitting: Neurology

## 2022-07-24 MED ORDER — PANTOPRAZOLE SODIUM 40 MG PO TBEC: 40.0000 mg | DELAYED_RELEASE_TABLET | Freq: Every day | ORAL | 0 refills | Status: DC

## 2022-07-24 NOTE — Telephone Encounter (Signed)
Phone room: please call pt back and let her know I spoke with Dr. Epimenio Foot who states d/t issue with eye, she needs to see ophthalmology, not neurology.  Notes per ED discharge: Schedule an appointment as soon as possible for a visit  with [name] Creek, MD.  Specialty: Ophthalmology Why: If not improving Contact information: [address] [place] [address] [phone]

## 2022-07-24 NOTE — Telephone Encounter (Signed)
Called pt. Advised I spoke w/ Dr. Epimenio Foot who agreed to fit her in this week for appt. Scheduled for 07/27/22 at 930am. Asked she check in by 900/915am. Pt verbalized understanding and stated she will be on time.  She has an ophthalmology appt scheduled w/ Dr. Dione Booze on 09/04/22.

## 2022-07-24 NOTE — ED Provider Notes (Signed)
Care of the patient assumed at the change of shift. Patient here for R eye pain, subconjunctival hemorrhage and headache. Was lifting a pallet of water when this happened. Awaiting CT head.  Physical Exam  BP 123/81   Pulse 71   Temp 98.4 F (36.9 C) (Oral)   Resp 16   Ht 5\' 4"  (1.626 m)   Wt 88.5 kg   SpO2 98%   BMI 33.47 kg/m   Physical Exam  Procedures  Procedures  ED Course / MDM   Clinical Course as of 07/24/22 0006  Wynelle Link Jul 23, 2022  2335 Patient signed out to Dr. Bernette Mayers pending CT and reassessment. [VK]  Mon Jul 24, 2022  0004 I personally viewed the images from radiology studies and agree with radiologist interpretation:  CT is neg. Patient reports symptoms are improved and she is comfortable going home. Recommend she follow up with Ophtho and Neurology if symptoms persistent. RTED for any worsening or other concerns.  [CS]    Clinical Course User Index [CS] Pollyann Savoy, MD [VK] Rexford Maus, DO   Medical Decision Making Problems Addressed: Acute nonintractable headache, unspecified headache type: acute illness or injury Subconjunctival hemorrhage of right eye: acute illness or injury  Amount and/or Complexity of Data Reviewed Radiology: ordered and independent interpretation performed. Decision-making details documented in ED Course.  Risk Prescription drug management.          Pollyann Savoy, MD 07/24/22 [phone]

## 2022-07-24 NOTE — Telephone Encounter (Signed)
Pt was called and informed of message. Pt stated she only wants to speak to the RN. She states that she has already spoken to the Ophthalmologist and they informed her that she is needing to speak to her neurologist due to her headaches. Pt is to come in for EEG today and is wanting to speak to the RN before she comes for her appt.

## 2022-07-24 NOTE — Telephone Encounter (Signed)
Pt said was seen in ED last night for hemophage in right eye and nonintractable headaches. Was advised to schedule appt with Dr. Epimenio Foot as soon as possible. Pt would like a call back.

## 2022-07-25 ENCOUNTER — Telehealth: Payer: Self-pay

## 2022-07-25 ENCOUNTER — Ambulatory Visit (INDEPENDENT_AMBULATORY_CARE_PROVIDER_SITE_OTHER): Payer: Medicaid Other | Admitting: Neurology

## 2022-07-25 DIAGNOSIS — R55 Syncope and collapse: Secondary | ICD-10-CM

## 2022-07-25 NOTE — Telephone Encounter (Signed)
Called patient this morning to confirm her EEG appointment this afternoon at 2pm. Pt understood what time her appt was and stated that she will be here at 1:30. I did inform her that if she was not checked in by 2 pm today, that we would have to reschedule her appt because there were other appts after her. Patient stated she understood and confirmed that she will be here on time.

## 2022-07-26 NOTE — Telephone Encounter (Signed)
Patient called in , stated she was wanting to confirm her appointment time for tomorrow. I let her know that her appointment was tomorrow, June 27 at 9:30 with a 9:00 check in. She verbalized understanding and stated she would be here at 9:00 for the appointment.

## 2022-07-27 ENCOUNTER — Telehealth: Payer: Self-pay | Admitting: Neurology

## 2022-07-27 ENCOUNTER — Encounter: Payer: Self-pay | Admitting: Neurology

## 2022-07-27 ENCOUNTER — Ambulatory Visit (INDEPENDENT_AMBULATORY_CARE_PROVIDER_SITE_OTHER): Payer: Medicaid Other | Admitting: Neurology

## 2022-07-27 VITALS — BP 131/76 | HR 67 | Ht 63.5 in | Wt 203.0 lb

## 2022-07-27 DIAGNOSIS — Z79899 Other long term (current) drug therapy: Secondary | ICD-10-CM | POA: Diagnosis not present

## 2022-07-27 DIAGNOSIS — Z6835 Body mass index (BMI) 35.0-35.9, adult: Secondary | ICD-10-CM

## 2022-07-27 DIAGNOSIS — G35 Multiple sclerosis: Secondary | ICD-10-CM

## 2022-07-27 DIAGNOSIS — R55 Syncope and collapse: Secondary | ICD-10-CM | POA: Diagnosis not present

## 2022-07-27 DIAGNOSIS — R269 Unspecified abnormalities of gait and mobility: Secondary | ICD-10-CM

## 2022-07-27 DIAGNOSIS — R208 Other disturbances of skin sensation: Secondary | ICD-10-CM

## 2022-07-27 NOTE — Progress Notes (Signed)
[facility]  PATIENT: Tamara [name] DOB: 02-Jan-1961  REFERRING DOCTOR OR PCP:  Nadean Corwin SOURCE: Patient, notes from emergency room and recent hospital stay ([facility]), imaging and lab reports, MRI images on PACS.  _________________________________   HISTORICAL  CHIEF COMPLAINT:  Chief Complaint  Patient presents with   Follow-up    Rm EMG1, alone.  Having headaches, dizziness. , had R eye hemmorhage went ED [facility]. Had CT. R/o stroke, detached retina.     HISTORY OF PRESENT ILLNESS:  Tamara [name] Is a 62 y.o. woman with relapsing remitting multiple sclerosis in May 2018 after she presented with transverse myelitis.   Update 07/27/2022 She is on Tysabri and tolerates it well.  Next infusion is 07/06/2022.   She is tolerating the infusions well  JCV Ab 12/01/2021 was 0.21 negative.  She has no exacerbation.   She has stable reduced gait and balance.Left > right arm numbness and tingling is still present but it is les painful than last year.    She could not tolerate gabapentin, Lyrica, imipramine, lamotrigine and oxcarbazepine and carbamazapine in the past Lacosamide did  not help.   She had ankle swelling with nortriptyline.  NCV/EMG was fine in the past so sensory symptoms likely from spinal cord plaques.     She has cramping and stiffness of her hands.   Tizanidine works better for her than baclofen.  It causes mild sleepiness so she takes just 12 2-3 times a day.    She is reporting HA and dizziness.    MRI 07/12/2022 was stable.   She had visual changes and went to [facility] ED and was found to have a hemorrhage .   A detached retina was felt to be a possible issue but she saw ophthalmology and, according to her,  was told no detached retina, just a muscle strain      She is eating better -- leafy vegetables, fish and chicken but no red meat.   She is concerned about weight.   She is on phentermine for a stimulant for MS fatigue and topiramate for migraine.  We  discussed that they sometimes help weight loss as well thouh she did not note any benefit with this.    She is not exercising - afraid to go to a gym.     She has had ankle/foot edema.  Her PCP was concerned that Tysabri may be causing this.  Though listed as a possible Adverse event, I have not seen this occur in over 150 patients I have treated with Tysabri so think this is unlikely.   No CHF on ECHO.. Advised to wear compression hose   She has no more episodes of LOC or presyncope.   EEG was normal 07/2022.    She is on topiramate with Tylenol for headaches with benefit.      She has fatigue.  She sleeps poorly at night.  She still takes phentermine in the morning    She has had a vit D deficiency and takes D3.    Her husband passed away 04/09/21.      MS History:   On 06/10/16, she had the onset of numbness and clumsiness in her legs.   As the day went on, her hands also became numb and clumsy.  She felt numb from her waist down.    She went to the [facility] and had an xtay of her back and was referred to orthopedics     Two  days later, she went to the ED when her symptoms worsened and she was seen and discharged.    She saw her internist who ordered a lumbar MRI.  The lumbar MRI showed degenerative changes that were mild at L4-L5 and the study was otherwise fairly normal.   She was referred to the ED and had an MRI of the brain, cervical spine and thoracic spine.    She was found to have a transverse myelitis in the cervical spine with an enhancing lesion in the posterior columns. There was also a smaller non-enhancing focus at C5   She also had 5-6 spots in her brain, the largest being periventricular on the right.  She was admitted to [facility] and had 3 days of IV Solumedrol followed by an oral taper.    She was transferred to rehabilitation and was discharged 5/30.   She walked out and felt much better.   Additionally, while she was in the hospital (06/15/2016) she had a lumbar  puncture. The CSF was abnormal showing showing the presence  4 oligoclonal bands and an elevated IgG index of 0.9 (less than 0.7 is normal). The myelin basic protein was mildly elevated at 2.2.      She had another exacerbation after returning home with left leg dragging and feeling more off balance with stumbling.   In retrospect, in 2017, she had an episode lasting a few weeks where she felt she was dragging her left leg some. This completely resolved and she did not have any other symptoms such as numbness at that time.   MRIs of the brain, cervical spine, thoracic spine and lumbar spine were personally reviewed. The MRI of the cervical spine shows an enhancing lesion posterior spinal cord adjacent to C2-C3. Additionally there is a nonenhancing focus adjacent to C5. In the brain, there are several T2/FLAIR hyperintense foci and the largest is in the right parietal lobe and there are 4 or 5 other small foci, one in the periventricular white matter of the right frontal lobe and the rest in the subcortical white matter.    Laboratory studies show 4 oligoclonal bands and an elevated IgG index of 0.9. Myelin basic protein was mildly elevated at 2.2. She had elevated glucose between 120 and 140 several times while getting steroids.   Her Vit D was mildly low (25.6) and she just started OTC supplementation.   She started Tysabri in July 2018  IMAGING  MRIs of the brain and cervical spine 07/16/2019 compared to the 2019 brain and 2018 cervical spine .  There are no new lesions. She has old cervical spine lesions at C3 and C5.  The brain shows multiple chronic foci in the hemispheres.  None in brainstem or cerebellum.  No focus enhances.    MRI cervical spie 09/24/2020 showed Stable chronic demyelinating plaques at C3 and C5 levels  MRI brain 07/12/2022 showed Several T2/FLAIR hyperintense foci in the cerebral hemispheres.  There overall pattern is nonspecific.  As she also has probable demyelinating plaque in the  cervical spine, these could be consistent with demyelination.  They could also be due to chronic microvascular ischemic change.  None of the foci appear to be acute.  They do not enhance.  Compared to the MRI from 2021, there are no symptoms.     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  It appears stable compared to older MRI      REVIEW OF  SYSTEMS: Constitutional: No fevers, chills, sweats, or change in appetite Eyes: No visual changes, double vision, eye pain Ear, nose and throat: No hearing loss, ear pain, nasal congestion, sore throat Cardiovascular: No chest pain, palpitations Respiratory:  No shortness of breath at rest or with exertion.   No wheezes GastrointestinaI: No nausea, vomiting, diarrhea, abdominal pain, fecal incontinence Genitourinary:  No dysuria, urinary retention or frequency.  No nocturia. Musculoskeletal:  No neck pain, back pain Integumentary: No rash, pruritus, skin lesions Neurological: as above Psychiatric: No depression at this time.  No anxiety Endocrine: No palpitations, diaphoresis, change in appetite, change in weigh or increased thirst Hematologic/Lymphatic:  No anemia, purpura, petechiae. Allergic/Immunologic: No itchy/runny eyes, nasal congestion, recent allergic reactions, rashes  ALLERGIES: Allergies  Allergen Reactions   Codeine Other (See Comments)    Delusions   Dilaudid [Hydromorphone Hcl] Swelling and Other (See Comments)    Tongue swells    Ditropan [Oxybutynin] Other (See Comments)    Burning sensation   Other Swelling and Other (See Comments)    Unnamed gel or antiseptic solution- Was applied to IV site with a needle- Turned the skin "black and blue" that remained (caused burning and phlebitis, also)   Paxil [Paroxetine Hcl] Other (See Comments)    Hallucinations and heavy periods    Tegretol [Carbamazepine] Itching   Trileptal [Oxcarbazepine] Swelling   Adhesive [Tape]  Hives, Itching, Rash and Other (See Comments)    PAPER TAPE   Augmentin [Amoxicillin-Pot Clavulanate] Rash   Gabapentin Rash and Other (See Comments)    Hallucinations and depression, also    HOME MEDICATIONS:  Current Outpatient Medications:    acetaminophen (TYLENOL) 650 MG CR tablet, Take 1,300 mg by mouth 3 (three) times daily as needed for pain., Disp: , Rfl:    ammonium lactate (AMLACTIN) 12 % lotion, Apply 1 Application topically as needed for dry skin., Disp: 400 g, Rfl: 0   ascorbic acid (VITAMIN C) 500 MG tablet, Take 1,500 mg by mouth daily., Disp: , Rfl:    Cholecalciferol (VITAMIN D3) 50 MCG (2000 UT) TABS, Take 5 tablets by mouth daily., Disp: , Rfl:    ciclopirox (PENLAC) 8 % solution, Apply topically at bedtime. Apply over nail and surrounding skin. Apply daily over previous coat. After seven (7) days, may remove with alcohol and continue cycle., Disp: 6.6 mL, Rfl: 2   Coenzyme Q10-Fish Oil-Vit E (CO-Q 10 OMEGA-3 FISH OIL PO), Take 1 capsule by mouth daily., Disp: , Rfl:    COLLAGEN PO, Take 1 Dose by mouth every other day. Powder; alternate with pill form., Disp: , Rfl:    COLLAGEN PO, Take 3 tablets by mouth every other day. Pill; alternate with powder form., Disp: , Rfl:    cyanocobalamin (VITAMIN B12) 1000 MCG tablet, Take 5,000 mcg by mouth daily. ODT, Disp: , Rfl:    Disposable Gloves (NITRILE GLOVES MEDIUM) MISC, Use prn for bowel incontinence, Disp: 100 each, Rfl: 5   Ferrous Sulfate (IRON PO), Take by mouth., Disp: , Rfl:    fluticasone (FLONASE) 50 MCG/ACT nasal spray, Place 2 sprays into both nostrils daily., Disp: 16 g, Rfl: 2   hyoscyamine (LEVSIN) 0.125 MG tablet, Take 1 tablet (0.125 mg total) by mouth 3 (three) times daily., Disp: 90 tablet, Rfl: 5   ibuprofen (ADVIL) 200 MG tablet, Take 200 mg by mouth every 6 (six) hours as needed., Disp: , Rfl:    Lactobacillus (ACIDOPHILUS/PECTIN PO), Take by mouth., Disp: , Rfl:    loratadine (CLARITIN)  10 MG tablet, Take  10 mg by mouth daily as needed for allergies or rhinitis., Disp: , Rfl:    LORazepam (ATIVAN) 1 MG tablet, Take 1 tablet (1 mg total) by mouth once as needed for up to 1 dose for sedation (before MRI)., Disp: 2 tablet, Rfl: 0   magnesium citrate SOLN, Take 1 Bottle by mouth once., Disp: , Rfl:    MINOXIDIL PO, Take by mouth., Disp: , Rfl:    Multiple Vitamins-Minerals (HAIR/SKIN/NAILS) CAPS, Take 1 capsule by mouth in the morning, at noon, and at bedtime., Disp: , Rfl:    Multiple Vitamins-Minerals (ONE-A-DAY WOMENS PO), Take 1 tablet by mouth daily., Disp: , Rfl:    natalizumab (TYSABRI) 300 MG/15ML injection, Inject 300 mg into the vein every 28 (twenty-eight) days., Disp: , Rfl:    pantoprazole (PROTONIX) 40 MG tablet, Take 1 tablet (40 mg total) by mouth daily., Disp: 90 tablet, Rfl: 0   phentermine 37.5 MG capsule, Take 1 capsule (37.5 mg total) by mouth every morning., Disp: 30 capsule, Rfl: 0   POTASSIUM BICARBONATE PO, Take by mouth., Disp: , Rfl:    Thiamine HCl (VITAMIN B-1 PO), Take by mouth., Disp: , Rfl:    tiZANidine (ZANAFLEX) 4 MG tablet, 1/2 to 1 pill po tid (Patient taking differently: Take 2-4 mg by mouth 3 (three) times daily as needed for muscle spasms. 1/2 to 1 pill po tid), Disp: 90 tablet, Rfl: 5   topiramate (TOPAMAX) 50 MG tablet, Take 1 tablet (50 mg total) by mouth 3 (three) times daily., Disp: 90 tablet, Rfl: 11   VITAMIN D PO, Take 5,000 Units by mouth daily., Disp: , Rfl:    Zinc 50 MG TABS, Take by mouth., Disp: , Rfl:    phentermine 37.5 MG capsule, Take 1 capsule (37.5 mg total) by mouth every morning. (Patient not taking: Reported on 07/27/2022), Disp: 30 capsule, Rfl: 0  PAST MEDICAL HISTORY: Past Medical History:  Diagnosis Date   Anemia    Bowel obstruction (HCC)    CKD (chronic kidney disease), stage II    based on labs   GERD (gastroesophageal reflux disease)    IBS (irritable bowel syndrome)    at age of 67   Multiple sclerosis (HCC)    RBBB     Sciatica 2009   Vision abnormalities     PAST SURGICAL HISTORY: Past Surgical History:  Procedure Laterality Date   ABDOMINAL HYSTERECTOMY  2008   cervix and right ovary still intact   KNEE ARTHROSCOPY  2010 and 2011   Left knee, x2    FAMILY HISTORY: Family History  Problem Relation Age of Onset   Cancer Father        Gallbladder   Gallbladder disease Father    Hypertension Brother    Gallbladder disease Paternal Grandmother    Colon cancer Neg Hx    Colon polyps Neg Hx    Esophageal cancer Neg Hx    Kidney disease Neg Hx     SOCIAL HISTORY:  Social History   Socioeconomic History   Marital status: Widowed    Spouse name: Not on file   Number of children: 2   Years of education: Not on file   Highest education level: Not on file  Occupational History   Occupation: Science writer  Tobacco Use   Smoking status: Never   Smokeless tobacco: Never  Vaping Use   Vaping Use: Never used  Substance and Sexual Activity   Alcohol use: No  Alcohol/week: 0.0 standard drinks of alcohol   Drug use: No   Sexual activity: Not on file  Other Topics Concern   Not on file  Social History Narrative   Not on file   Social Determinants of Health   Financial Resource Strain: Not on file  Food Insecurity: No Food Insecurity (03/21/2022)   Hunger Vital Sign    Worried About Running Out of Food in the Last Year: Never true    Ran Out of Food in the Last Year: Never true  Transportation Needs: No Transportation Needs (01/16/2022)   PRAPARE - Administrator, Civil Service (Medical): No    Lack of Transportation (Non-Medical): No  Physical Activity: Insufficiently Active (01/16/2022)   Exercise Vital Sign    Days of Exercise per Week: 7 days    Minutes of Exercise per Session: 20 min  Stress: Not on file  Social Connections: Not on file  Intimate Partner Violence: Not on file     PHYSICAL EXAM  Vitals:   07/27/22 0958  BP: 131/76  Pulse: 67   Weight: 203 lb (92.1 kg)  Height: 5' 3.5" (1.613 m)     Body mass index is 35.4 kg/m.   General: The patient is well-developed and well-nourished and in no acute distress   has 2+ edema in feet/ankles   Neurologic Exam  Mental status: The patient is alert and oriented x 3 at the time of the examination. The patient has apparent normal recent and remote memory, with an apparently normal attention span and concentration ability.   Speech is normal.  Cranial nerves: Extraocular movements are full. Facial strength and sensation is normal. Trapezius strength is normal.. Hearing appears to be symmetric.  Motor:  Muscle bulk is normal.   Muscle tone is normal. Strength is 5/5 except 4+/5 hip flexion (iliopsoas).      Sensory:   She had normal sensation to vibration in the arms and legs.  She reported some altered sensation in the left hand compared to the right.    Coordination: Cerebellar testing reveals good finger-nose-finger and mild reduced left heel-to-shin bilaterally.  Gait and station: Station is normal.  Gait is wide.  Tandem is poor.   She has a mild left foot drop   Romberg is negative.    Reflexes:  . Deep tendon reflexes are 3 and symmetric in the knees and ankles and 2 and symmetric in the arms.      DIAGNOSTIC DATA (LABS, IMAGING, TESTING) - I reviewed patient records, labs, notes, testing and imaging myself where available.  Lab Results  Component Value Date   WBC 6.0 07/03/2022   HGB 11.3 07/03/2022   HCT 35.3 07/03/2022   MCV 88 07/03/2022   PLT 166 07/03/2022      Component Value Date/Time   NA 144 07/03/2022 1555   K 4.0 07/03/2022 1555   CL 108 (H) 07/03/2022 1555   CO2 24 07/03/2022 1555   GLUCOSE 88 07/03/2022 1555   GLUCOSE 94 09/07/2020 1405   BUN 19 07/03/2022 1555   CREATININE 1.14 (H) 07/03/2022 1555   CREATININE 1.01 07/08/2013 1440   CALCIUM 9.6 07/03/2022 1555   PROT 6.3 07/03/2022 1555   ALBUMIN 4.4 07/03/2022 1555   AST 14 07/03/2022  1555   ALT 14 07/03/2022 1555   ALKPHOS 82 07/03/2022 1555   BILITOT <0.2 07/03/2022 1555   GFRNONAA >60 09/07/2020 1405   GFRNONAA 64 07/08/2013 1440   GFRAA >60 07/10/2019 0519  GFRAA 74 07/08/2013 1440   Lab Results  Component Value Date   CHOL 200 (H) 03/31/2021   HDL 66 03/31/2021   LDLCALC 123 (H) 03/31/2021   TRIG 63 03/31/2021   CHOLHDL 3.0 03/31/2021   Lab Results  Component Value Date   HGBA1C 5.9 (H) 03/31/2021   Lab Results  Component Value Date   VITAMINB12 1,059 03/22/2020   Lab Results  Component Value Date   TSH 2.780 07/03/2022       ASSESSMENT AND PLAN  Multiple sclerosis (HCC)  BMI 35.0-35.9,adult - Plan: [facility]ulatory [facility] to [facility]  High risk medication use  Dysesthesia  Gait disturbance  Syncope, unspecified syncope type  1.   For her MS, continue Tysabri..       Her MS has been stable.   No exacerbations.   She is JCV Ab negative.  2.   She will continue phentermine for MS fatigue and Topamax for migraine.   Unfortunately she has not had any weight loss benefit with these.   She would like a referral to the weight loss clinic. 3.   Due to combination of physical and cognitive/fatigue issues, she is disabled and unable to work.  4.   Vit D supplementation.    5.   Continue tizanidine 2 mg po tid for spasticity.   Continue TPM 50 mg po tid and Tylenol CR 650 mg po tid for migraines 6.   Rtc 6 months  This visit is part of a comprehensive longitudinal care medical relationship regarding the patients primary diagnosis of MS and related concerns.   Meagan Spease A. Epimenio Foot, MD, PhD, [place]  07/27/2022, 11:11 AM Certified in Neurology, Clinical Neurophysiology, Sleep Medicine, Pain Medicine and Neuroimaging Dir., [facility]  [facility] [address] [place], [place] [address] [phone]

## 2022-07-27 NOTE — Telephone Encounter (Signed)
Referral faxed to [facility]: Phone: [phone] Fax: [phone] 

## 2022-07-28 NOTE — Progress Notes (Signed)
   [facility]  EEG (ELECTROENCEPHALOGRAM) REPORT   STUDY DATE: 07/25/2022  PATIENT NAME: Tamara [name] DOB: 1960-02-23 MRN: [id number]  ORDERING CLINICIAN: Gini Caputo A. Epimenio Foot, MD, PhD  TECHNOLOGIST: Marianne Sofia, REEGT  TECHNIQUE: Electroencephalogram was recorded utilizing standard 10-20 system of lead placement and reformatted into average and bipolar montages.  RECORDING TIME: 26 m 33 s  CLINICAL INFORMATION: 62 year old woman with syncope  FINDINGS: A digital EEG was performed while the patient was awake and drowsy. While awake and most alert there was a 10 hz posterior dominant rhythm. Voltages and frequencies were symmetric.  There were no focal, lateralizing, epileptiform activity or seizures seen.  Photic stimulation had a normal driving response. Hyperventilation and recovery did not change the underlying rhythms. EKG channel shows NSR.  A brief period of sleep was recorded.  IMPRESSION: Normal EEG while awake and asleep   INTERPRETING PHYSICIAN:   Jalesa Thien A. Epimenio Foot, MD, PhD, [facility] Certified in Neurology, Clinical Neurophysiology, Sleep Medicine, Pain Medicine and Neuroimaging  [facility] [address], [address] [place], [place] [address] [phone]

## 2022-07-31 ENCOUNTER — Telehealth: Payer: Self-pay | Admitting: *Deleted

## 2022-07-31 NOTE — Telephone Encounter (Signed)
Received via fax TOUCH patient status report to be completed.  Completed and to be signed.

## 2022-07-31 NOTE — Telephone Encounter (Signed)
Touch report signed and faxed. Received fax confirmation.

## 2022-08-02 ENCOUNTER — Non-Acute Institutional Stay (HOSPITAL_COMMUNITY)
Admission: RE | Admit: 2022-08-02 | Discharge: 2022-08-02 | Disposition: A | Discharge status: Home / Self Care | Payer: Medicaid Other | Source: Ambulatory Visit | Attending: Internal Medicine | Admitting: Internal Medicine

## 2022-08-02 DIAGNOSIS — G35 Multiple sclerosis: Secondary | ICD-10-CM | POA: Insufficient documentation

## 2022-08-02 MED ORDER — SODIUM CHLORIDE 0.9 % IV SOLN: INTRAVENOUS | Status: DC | PRN

## 2022-08-02 MED ORDER — SODIUM CHLORIDE 0.9 % IV SOLN
300.0000 mg | INTRAVENOUS | Status: DC
  Administered 2022-08-02: 300 mg via INTRAVENOUS
  Filled 2022-08-02: qty 15

## 2022-08-02 NOTE — Progress Notes (Signed)
PATIENT CARE CENTER NOTE   Diagnosis: Multiple Sclerosis ( HCC) [G35]      Provider: Despina Arias, MD     Procedure: Donnamarie Poag 300mg  infusion     Note: Patient received Tysabri infusion ( #2 of 5) via PIV. No premeds required per orders.  Tolerated infusion well with no adverse reaction. Vital signs stable. Pt declined to stay for 1 hour observation after infusion completed. Pt declined AVS. Pt to RTC in 1 month for next infusion, instructed pt to schedule this at front desk prior to leaving, verbalized understanding.   Alert, oriented and ambulatory at discharge.

## 2022-08-03 ENCOUNTER — Encounter (HOSPITAL_COMMUNITY): Payer: Medicaid Other

## 2022-08-07 ENCOUNTER — Telehealth: Payer: Self-pay | Admitting: *Deleted

## 2022-08-14 NOTE — Telephone Encounter (Signed)
   Faxed to the number requested on form. Confirmation received.

## 2022-08-22 ENCOUNTER — Other Ambulatory Visit: Payer: Self-pay | Admitting: Neurology

## 2022-08-23 NOTE — Telephone Encounter (Signed)
Last seen on 07/27/22 Follow up scheduled on 02/08/23 Last filled on 03/30/22 #30 tablets (30 day supply) Rx pending to be signed   I called patient and she reports she is still taking medication.

## 2022-08-24 DIAGNOSIS — Z0289 Encounter for other administrative examinations: Secondary | ICD-10-CM

## 2022-08-28 ENCOUNTER — Telehealth: Payer: Self-pay | Admitting: *Deleted

## 2022-08-29 NOTE — Telephone Encounter (Signed)
Called her back, she mainly wanted to update me about issues relating to her husband's passing, namely his sister taking advantage, she has legal consul and is in the process of working through that.

## 2022-08-30 ENCOUNTER — Encounter (INDEPENDENT_AMBULATORY_CARE_PROVIDER_SITE_OTHER): Payer: Medicaid Other | Admitting: Family Medicine

## 2022-08-30 ENCOUNTER — Non-Acute Institutional Stay (HOSPITAL_COMMUNITY)
Admission: RE | Admit: 2022-08-30 | Discharge: 2022-08-30 | Disposition: A | Discharge status: Home / Self Care | Payer: Medicaid Other | Source: Ambulatory Visit | Attending: Internal Medicine | Admitting: Internal Medicine

## 2022-08-30 DIAGNOSIS — G35 Multiple sclerosis: Secondary | ICD-10-CM | POA: Insufficient documentation

## 2022-08-30 MED ORDER — SODIUM CHLORIDE 0.9 % IV SOLN: INTRAVENOUS | Status: DC | PRN

## 2022-08-30 MED ORDER — SODIUM CHLORIDE 0.9 % IV SOLN
300.0000 mg | INTRAVENOUS | Status: DC
  Administered 2022-08-30: 300 mg via INTRAVENOUS
  Filled 2022-08-30: qty 15

## 2022-08-30 NOTE — Progress Notes (Signed)
PATIENT CARE CENTER NOTE   Diagnosis: Multiple Sclerosis ( HCC) [G35]      Provider: Despina Arias, MD     Procedure: Donnamarie Poag 300mg  infusion     Note: Patient received Tysabri ( dose #3 of 5) infusion via PIV. No premeds required per orders. Tolerated infusion well with no adverse reaction. Vital signs stable. Pt declined AVS. Patient declined to stay for the 1 hour post-infusion observation. Pt instructed to schedule her monthly appointment at front desk prior to leaving, verbalized understanding.  Alert, oriented and ambulatory at discharge.

## 2022-08-31 ENCOUNTER — Encounter (INDEPENDENT_AMBULATORY_CARE_PROVIDER_SITE_OTHER): Payer: Medicaid Other | Admitting: Family Medicine

## 2022-09-25 ENCOUNTER — Telehealth: Payer: Self-pay | Admitting: *Deleted

## 2022-09-25 ENCOUNTER — Other Ambulatory Visit: Payer: Medicaid Other | Admitting: *Deleted

## 2022-09-25 NOTE — Patient Outreach (Signed)
  Care Management   Note  09/25/2022 Name: Tamara [name] MRN: [id number] DOB: Jul 27, 1960  Tamara [name] is enrolled in a Managed Medicaid plan: Yes. Outreach attempt today was unsuccessful.   The care management team will reach out to the patient again over the next 7 days.   [name] RN, BSN [place]  [facility] RN Care Coordinator [phone]

## 2022-09-25 NOTE — Patient Outreach (Signed)
Medicaid Managed Care   Nurse Care Manager Note  09/25/2022 Name:  Tamara [name] MRN:  [id number] DOB:  January 02, 1961  Tamara [name] is an 62 y.o. year old female who is a primary patient of [name], DO.  The [facility] Managed Care Coordination team was consulted for assistance with:    HLD Weight Management  Tamara [name] was given information about Medicaid Managed Care Coordination team services today. Tamara [name] Patient agreed to services and verbal consent obtained.  Engaged with patient by telephone for follow up visit in response to provider referral for case management and/or care coordination services.   Assessments/Interventions:  Review of past medical history, allergies, medications, health status, including review of consultants reports, laboratory and other test data, was performed as part of comprehensive evaluation and provision of chronic care management services.  SDOH (Social Determinants of Health) assessments and interventions performed: SDOH Interventions    Flowsheet Row Patient Outreach Telephone from 03/21/2022 in [place] POP[facility] DEPARTMENT Patient Outreach Telephone from 01/16/2022 in [place] POPULATION HEALTH DEPARTMENT Office Visit from 08/04/2021 in [facility] Patient Outreach Telephone from 07/27/2021 in [facility] Community Care Coordination Patient Outreach Telephone from 01/21/2021 in [facility] Community Care Coordination  SDOH Interventions       Food Insecurity Interventions Intervention Not Indicated Intervention Not Indicated -- Intervention Not Indicated --  Housing Interventions Intervention Not Indicated -- -- -- Intervention Not Indicated  Transportation Interventions -- Intervention Not Indicated -- Intervention Not Indicated Intervention Not Indicated  Utilities Interventions Intervention Not Indicated -- -- -- --  Depression Interventions/Treatment  -- -- Currently on Treatment  -- --  Physical Activity Interventions -- Intervention Not Indicated -- -- Other (Comments)  [she needs a home exercise machine to keep her mobile - she will check with benefits at her managed Medicaid health plan]       Care Plan  Allergies  Allergen Reactions   Codeine Other (See Comments)    Delusions   Dilaudid [Hydromorphone Hcl] Swelling and Other (See Comments)    Tongue swells    Ditropan [Oxybutynin] Other (See Comments)    Burning sensation   Other Swelling and Other (See Comments)    Unnamed gel or antiseptic solution- Was applied to IV site with a needle- Turned the skin "black and blue" that remained (caused burning and phlebitis, also)   Paxil [Paroxetine Hcl] Other (See Comments)    Hallucinations and heavy periods    Tegretol [Carbamazepine] Itching   Trileptal [Oxcarbazepine] Swelling   Adhesive [Tape] Hives, Itching, Rash and Other (See Comments)    PAPER TAPE   Augmentin [Amoxicillin-Pot Clavulanate] Rash   Gabapentin Rash and Other (See Comments)    Hallucinations and depression, also    Medications Reviewed Today   Medications were not reviewed in this encounter     Patient Active Problem List   Diagnosis Date Noted   Bilateral lower extremity edema 07/09/2022   Diarrhea of presumed infectious origin 03/16/2022   Immunocompromised state due to drug therapy (HCC) 03/16/2022   Food contamination 03/16/2022   Herpes zoster without complication 08/04/2021   Alopecia 08/04/2021   Foot pain, bilateral 08/04/2021   Prediabetes 05/25/2021   Vitamin D deficiency 06/14/2020   B12 deficiency 03/22/2020   Left-sided chest wall pain 12/18/2019   Spasticity 08/18/2019   Sleep apnea 07/18/2019   Bilateral hand numbness 01/14/2019   RLQ abdominal pain 10/22/2018   Pedal edema 06/04/2018  Gait disturbance 06/04/2018   Bilateral knee pain 01/07/2018   Conjunctivitis 10/30/2017   Dental caries 07/30/2017   Dyspnea on exertion 07/30/2017   Obesity (BMI  30.0-34.9) 12/18/2016   URTI (acute upper respiratory infection) 12/08/2016   Dysesthesia 10/11/2016   High risk medication use 07/04/2016   Multiple sclerosis (HCC) 06/19/2016   Neurogenic bowel    Neurogenic bladder    Allergic rhinitis 04/12/2016   Sciatica associated with disorder of lumbosacral spine 01/15/2014   GERD (gastroesophageal reflux disease) 07/21/2013   RBBB on EKG 08/01/2012   IBS (irritable bowel syndrome) 06/19/2012   Healthcare maintenance 06/19/2012    Conditions to be addressed/monitored per PCP order:  HLD and Weight Management  Care Plan : RN Care Manager Plan Of Care  Updates made by Heidi Dach, RN since 09/25/2022 12:00 AM     Problem: Knowledge Deficit and Care Coordination Needs Related to Management of Multiple Sclerosis   Priority: High     Long-Range Goal: Development of Plan Of Care to Address Care Coordination Needs and Knowledge Deficits for Management of Multiple Sclerosis, prediabetes and HLD   Start Date: 01/21/2021  Expected End Date: 10/27/2022  Priority: High  Note:   Current Barriers:  Knowledge Deficits related to plan of care for management of Multiple Sclerosis, prediabetes and HLD  Care Coordination needs related to Lacks knowledge of community resource: family psychiatric evaluation to assist with securing custody of granddaughter [name]  Tamara [name] called RNCM with concern regarding needing dental work and the cost/insurance coverage. Tamara [name] reports dental work will total 20 thousand dollars and she will need assistance with affording needed services.   RNCM Clinical Goal(s):  Patient will demonstrate ongoing adherence to prescribed treatment plan for multiple sclerosis as evidenced by no inpatient hospitalizations or ED visits due to MS exacerbation or patient reports of MS symptom exacerbation through collaboration with RN Care manager, provider, and care team.   Interventions: Inter-disciplinary care team collaboration (see  longitudinal plan of care) Evaluation of current treatment plan related to  self management and patient's adherence to plan as established by provider Provided therapeutic listening Provided patient with [facility] [phone], verify office is in network and schedule an office visit Advised patient to reach out to [phone] for member benefits/dental coverage     Weight Loss Interventions:  (Status:  Goal on track:  Yes.) Long Term Goal-  Advised patient to discuss with primary care provider options regarding weight management Reviewed recommended dietary changes: avoid fad diets, make small/incremental dietary and exercise changes, eat at the table and avoid eating in front of the TV, plan management of cravings, monitor snacking and cravings in food diary   Reviewed upcoming appointments with Weight and Wellness   Hyperlipidemia Interventions:  (Status:  Condition stable.  Not addressed this visit.) Long Term Goal- Patient is exercising daily and has changed her diet Medication review performed; medication list updated in electronic medical record.  Counseled on importance of regular laboratory monitoring as prescribed   Reviewed upcoming appointments including scheduling follow up with Neurology and PCP   Prediabetes  (Status:  Condition stable.  Not addressed this visit.)  Long Term Goal- Evaluation of current treatment plan related to  prediabetes ,  self-management and patient's adherence to plan as established by provider. Discussed plans with patient for ongoing care management follow up and provided patient with direct contact information for care management team Discussed the importance of weight loss, healthy diet, and exercise Provided encouragement to continue  with the dietary changes and exercise routine   Patient Goals/Self-Care Activities: Take medications as prescribed   Attend all scheduled provider appointments Call pharmacy for medication refills 3-7  days in advance of running out of medications Perform all self care activities independently  Perform IADL's (shopping, preparing meals, housekeeping, managing finances) independently Call provider office for new concerns or questions  Call health plan member services to discuss benefits details specific to patient's needs Schedule colonoscopy         Follow Up:  Patient agrees to Care Plan and Follow-up.  Plan: The Managed Medicaid care management team will reach out to the patient again over the next 30 days.  Date/time of next scheduled RN care management/care coordination outreach:  10/26/22 @ 9am  [name] RN, BSN [place]  Value-Based Care [facility] RN Care Coordinator [phone]

## 2022-09-25 NOTE — Patient Instructions (Signed)
Visit Information  Ms. Ehmann was given information about Medicaid Managed Care team care coordination services as a part of their [facility] Community Plan Medicaid benefit. Mandilyn Villafana Leonetti verbally consented to engagement with the [facility] Managed Care team.   Thank you for reaching out today. I checked on the calendars that you asked about. They are no longer available. They actually ended June 2024.    If you are experiencing a medical emergency, please call 911 or report to your local emergency department or urgent care.   If you have a non-emergency medical problem during routine business hours, please contact your provider's office and ask to speak with a nurse.   For questions related to your [facility], please call: [phone] or visit the homepage here: [email or web address]  If you would like to schedule transportation through your [facility], please call the following number at least 2 days in advance of your appointment: [phone]   Rides for urgent appointments can also be made after hours by calling Member Services.  Call the Behavioral Health Crisis Line at [phone], at any time, 24 hours a day, 7 days a week. If you are in danger or need immediate medical attention call 911.  If you would like help to quit smoking, call 1-800-QUIT-NOW (205-418-9928) OR Espaol: 1-855-Djelo-Ya (7-253-664-4034) o para ms informacin haga clic aqu or Text READY to 742-595 to register via text  Ms. Liggins,   Please see education materials related to oral care provided by MyChart link.  Patient verbalizes understanding of instructions and care plan provided today and agrees to view in MyChart. Active MyChart status and patient understanding of how to access instructions and care plan via MyChart confirmed with patient.     Telephone follow up appointment with Managed Medicaid  care management team member scheduled for:10/26/22 @ 9am  [name] RN, BSN [place]  [facility]Based [facility] RN Care Coordinator [phone]   Following is a copy of your plan of care:  Care Plan : RN Care Manager Plan Of Care  Updates made by Heidi Dach, RN since 09/25/2022 12:00 AM     Problem: Knowledge Deficit and Care Coordination Needs Related to Management of Multiple Sclerosis   Priority: High     Long-Range Goal: Development of Plan Of Care to Address Care Coordination Needs and Knowledge Deficits for Management of Multiple Sclerosis, prediabetes and HLD   Start Date: 01/21/2021  Expected End Date: 10/27/2022  Priority: High  Note:   Current Barriers:  Knowledge Deficits related to plan of care for management of Multiple Sclerosis, prediabetes and HLD  Care Coordination needs related to Lacks knowledge of community resource: family psychiatric evaluation to assist with securing custody of granddaughter [name]  Ms. Lopp called RNCM with concern regarding needing dental work and the cost/insurance coverage. Ms. Milliner reports dental work will total 20 thousand dollars and she will need assistance with affording needed services.   RNCM Clinical Goal(s):  Patient will demonstrate ongoing adherence to prescribed treatment plan for multiple sclerosis as evidenced by no inpatient hospitalizations or ED visits due to MS exacerbation or patient reports of MS symptom exacerbation through collaboration with RN Care manager, provider, and care team.   Interventions: Inter-disciplinary care team collaboration (see longitudinal plan of care) Evaluation of current treatment plan related to  self management and patient's adherence to plan as established by provider Provided therapeutic listening Provided patient with [facility] [phone], verify office is  in network and schedule an office visit Advised patient to reach out to [phone] for  member benefits/dental coverage     Weight Loss Interventions:  (Status:  Goal on track:  Yes.) Long Term Goal-  Advised patient to discuss with primary care provider options regarding weight management Reviewed recommended dietary changes: avoid fad diets, make small/incremental dietary and exercise changes, eat at the table and avoid eating in front of the TV, plan management of cravings, monitor snacking and cravings in food diary   Reviewed upcoming appointments with Weight and Wellness   Hyperlipidemia Interventions:  (Status:  Condition stable.  Not addressed this visit.) Long Term Goal- Patient is exercising daily and has changed her diet Medication review performed; medication list updated in electronic medical record.  Counseled on importance of regular laboratory monitoring as prescribed   Reviewed upcoming appointments including scheduling follow up with Neurology and PCP   Prediabetes  (Status:  Condition stable.  Not addressed this visit.)  Long Term Goal- Evaluation of current treatment plan related to  prediabetes ,  self-management and patient's adherence to plan as established by provider. Discussed plans with patient for ongoing care management follow up and provided patient with direct contact information for care management team Discussed the importance of weight loss, healthy diet, and exercise Provided encouragement to continue with the dietary changes and exercise routine   Patient Goals/Self-Care Activities: Take medications as prescribed   Attend all scheduled provider appointments Call pharmacy for medication refills 3-7 days in advance of running out of medications Perform all self care activities independently  Perform IADL's (shopping, preparing meals, housekeeping, managing finances) independently Call provider office for new concerns or questions  Call health plan member services to discuss benefits details specific to patient's needs Schedule colonoscopy

## 2022-09-27 ENCOUNTER — Encounter (HOSPITAL_COMMUNITY): Payer: Medicaid Other

## 2022-09-29 ENCOUNTER — Other Ambulatory Visit: Payer: Self-pay | Admitting: Neurology

## 2022-09-29 ENCOUNTER — Non-Acute Institutional Stay (HOSPITAL_COMMUNITY)
Admission: RE | Admit: 2022-09-29 | Discharge: 2022-09-29 | Disposition: A | Discharge status: Home / Self Care | Payer: Medicaid Other | Source: Ambulatory Visit | Attending: Internal Medicine | Admitting: Internal Medicine

## 2022-09-29 DIAGNOSIS — G35 Multiple sclerosis: Secondary | ICD-10-CM | POA: Diagnosis present

## 2022-09-29 MED ORDER — SODIUM CHLORIDE 0.9 % IV SOLN
300.0000 mg | Freq: Once | INTRAVENOUS | Status: AC
  Administered 2022-09-29: 300 mg via INTRAVENOUS
  Filled 2022-09-29: qty 15

## 2022-09-29 MED ORDER — SODIUM CHLORIDE 0.9 % IV SOLN: INTRAVENOUS | Status: DC | PRN

## 2022-09-29 MED ORDER — PHENTERMINE HCL 37.5 MG PO CAPS: 37.5000 mg | ORAL_CAPSULE | Freq: Every morning | ORAL | 0 refills | Status: DC

## 2022-09-29 NOTE — Progress Notes (Signed)
PATIENT CARE CENTER NOTE   Diagnosis: Multiple Sclerosis ( HCC) [G35]      Provider: Despina Arias, MD     Procedure: Donnamarie Poag 300mg  infusion     Note: Patient received Tysabri infusion (dose # 4 of 5)  via PIV. No premeds required per orders. Tolerated infusion well with no adverse reaction. Vital signs stable. Patient declined AVS. Patient observed for 1 hour post-infusion. Patient instructed to schedule her monthly appointment at front desk prior to leaving, verbalized understanding.  Alert, oriented and ambulatory at discharge.

## 2022-09-29 NOTE — Telephone Encounter (Signed)
Pt request refill for phentermine 37.5 MG capsule sent to  [facility] [phone]

## 2022-09-29 NOTE — Telephone Encounter (Signed)
Dr.Sater patient ( you are on call provider) Last seen on 07/27/22 Follow up scheduled on 02/08/23 Last sent on 08/23/22 #30 tablets (30 day supply) Rx pending to be signed

## 2022-10-10 ENCOUNTER — Ambulatory Visit (INDEPENDENT_AMBULATORY_CARE_PROVIDER_SITE_OTHER): Payer: Medicaid Other | Admitting: Internal Medicine

## 2022-10-23 ENCOUNTER — Ambulatory Visit (INDEPENDENT_AMBULATORY_CARE_PROVIDER_SITE_OTHER): Payer: Medicaid Other | Admitting: Family Medicine

## 2022-10-23 ENCOUNTER — Encounter (INDEPENDENT_AMBULATORY_CARE_PROVIDER_SITE_OTHER): Payer: Self-pay | Admitting: Family Medicine

## 2022-10-23 VITALS — BP 134/83 | HR 58 | Temp 98.5°F | Ht 63.5 in | Wt 194.4 lb

## 2022-10-23 DIAGNOSIS — Z6833 Body mass index (BMI) 33.0-33.9, adult: Secondary | ICD-10-CM | POA: Diagnosis not present

## 2022-10-23 DIAGNOSIS — Z1331 Encounter for screening for depression: Secondary | ICD-10-CM

## 2022-10-23 DIAGNOSIS — R5383 Other fatigue: Secondary | ICD-10-CM | POA: Diagnosis not present

## 2022-10-23 DIAGNOSIS — E6609 Other obesity due to excess calories: Secondary | ICD-10-CM | POA: Diagnosis not present

## 2022-10-23 DIAGNOSIS — E538 Deficiency of other specified B group vitamins: Secondary | ICD-10-CM

## 2022-10-23 DIAGNOSIS — R0602 Shortness of breath: Secondary | ICD-10-CM | POA: Diagnosis not present

## 2022-10-23 DIAGNOSIS — E559 Vitamin D deficiency, unspecified: Secondary | ICD-10-CM

## 2022-10-23 DIAGNOSIS — R7303 Prediabetes: Secondary | ICD-10-CM

## 2022-10-23 DIAGNOSIS — E66811 Obesity, class 1: Secondary | ICD-10-CM

## 2022-10-23 NOTE — Progress Notes (Signed)
Chief Complaint:   OBESITY Tamara [name] (MR# [id number]) is a 62 y.o. female who presents for evaluation and treatment of obesity and related comorbidities. Current BMI is Body mass index is 33.9 kg/m. Tamara [name] has been struggling with her weight for many years and has been unsuccessful in either losing weight, maintaining weight loss, or reaching her healthy weight goal.  Patient has a history of MS and dysgeusia status post COVID.  She is on phentermine for fatigue versus weight loss, and she has gained weight since starting approximately 2 years ago.  No previous dieting, and she wants to decrease the risk of heart disease.  Tamara [name] is currently in the action stage of change and ready to dedicate time achieving and maintaining a healthier weight. Tamara [name] is interested in becoming our patient and working on intensive lifestyle modifications including (but not limited to) diet and exercise for weight loss.  Tamara [name]'s habits were reviewed today and are as follows: Her family eats meals together, she thinks her family will eat healthier with her, her desired weight loss is 44 lbs, she started gaining weight with changes in IBS, her heaviest weight ever was 223 pounds, she is a picky eater and doesn't like to eat healthier foods, she has significant food cravings issues, she snacks frequently in the evenings, and she skips meals frequently.  Depression Screen Tamara [name]'s Food and Mood (modified PHQ-9) score was 19.  Subjective:   1. Other fatigue Tamara [name] admits to daytime somnolence and admits to waking up still tired. Patient has a history of symptoms of daytime fatigue and morning fatigue. Tamara [name] generally gets 2 hours of sleep per night, and states that she has nightime awakenings. Snoring is not present. Apneic episodes are not present. Epworth Sleepiness Score is 7.   2. SOBOE (shortness of breath on exertion) Tamara [name] notes increasing shortness of breath with exercising and seems to be worsening over  time with weight gain. She notes getting out of breath sooner with activity than she used to. This has not gotten worse recently. Tamara [name] denies shortness of breath at rest or orthopnea.  3. Vitamin D deficiency Patient is on OTC vitamin D, and she has no recent labs.  4. B12 deficiency Patient is on OTC B12, and she has no recent labs.  5. Prediabetes Patient's recent A1c was elevated at 5.9.  She is working on her diet.  Assessment/Plan:   1. Other fatigue Tamara [name] does feel that her weight is causing her energy to be lower than it should be. Fatigue may be related to obesity, depression or many other causes. Labs will be ordered, and in the meanwhile, Tamara [name] will focus on self care including making healthy food choices, increasing physical activity and focusing on stress reduction.  - EKG 12-Lead - CBC with Differential/Platelet - TSH  2. SOBOE (shortness of breath on exertion) Tamara [name] does feel that she gets out of breath more easily that she used to when she exercises. Tamara [name]'s shortness of breath appears to be obesity related and exercise induced. IC was lower than expected. Patient will start her Category 1 eating plan and will work on weight loss, and gradually increase exercise to treat her exercise induced shortness of breath. Will continue to monitor closely.  3. Vitamin D deficiency We will check labs today, and we will follow-up at patient's next visit in 2 weeks.  - VITAMIN D 25 Hydroxy (Vit-D Deficiency, Fractures)  4. B12 deficiency We will check labs today, and we will follow-up  at patient's next visit in 2 weeks.  - Vitamin B12  5. Prediabetes We will check labs today. Patient will start on her Category 1 plan, and we will follow-up at her next visit in 2 weeks.   - CMP14+EGFR - Lipid Panel With LDL/HDL Ratio - Insulin, random - Hemoglobin A1c  6. Depression screening Tamara [name] had a positive depression screening. Depression is commonly associated with obesity and often  results in emotional eating behaviors. We will monitor this closely and work on CBT to help improve the non-hunger eating patterns. Referral to Psychology may be required if no improvement is seen as she continues in our clinic.  7. BMI 33.0-33.9,adult  8. Class 1 obesity due to excess calories with serious comorbidity and body mass index (BMI) of 33.0 to 33.9 in adult Tamara [name] is currently in the action stage of change and her goal is to continue with weight loss efforts. I recommend Tamara [name] begin the structured treatment plan as follows:  She has agreed to the Category 1 Plan.  Exercise goals: No exercise has been prescribed for now, while we concentrate on nutritional changes.   Behavioral modification strategies: increasing lean protein intake and no skipping meals.  She was informed of the importance of frequent follow-up visits to maximize her success with intensive lifestyle modifications for her multiple health conditions. She was informed we would discuss her lab results at her next visit unless there is a critical issue that needs to be addressed sooner. Tamara [name] agreed to keep her next visit at the agreed upon time to discuss these results.  Objective:   Blood pressure 134/83, pulse (!) 58, temperature 98.5 F (36.9 C), temperature source Oral, height 5' 3.5" (1.613 m), weight 194 lb 6.4 oz (88.2 kg), SpO2 (!) 76%. Body mass index is 33.9 kg/m.  EKG: Normal sinus rhythm, rate 58 BPM.  Indirect Calorimeter completed today shows a VO2 of 193 and a REE of 1325.  Her calculated basal metabolic rate is 1610 thus her basal metabolic rate is worse than expected.  General: Cooperative, alert, well developed, in no acute distress. HEENT: Conjunctivae and lids unremarkable. Cardiovascular: Regular rhythm.  Lungs: Normal work of breathing. Neurologic: No focal deficits.   Lab Results  Component Value Date   CREATININE 1.14 (H) 07/03/2022   BUN 19 07/03/2022   NA 144 07/03/2022   K 4.0  07/03/2022   CL 108 (H) 07/03/2022   CO2 24 07/03/2022   Lab Results  Component Value Date   ALT 14 07/03/2022   AST 14 07/03/2022   ALKPHOS 82 07/03/2022   BILITOT <0.2 07/03/2022   Lab Results  Component Value Date   HGBA1C 5.9 (H) 03/31/2021   No results found for: "INSULIN" Lab Results  Component Value Date   TSH 2.780 07/03/2022   Lab Results  Component Value Date   CHOL 200 (H) 03/31/2021   HDL 66 03/31/2021   LDLCALC 123 (H) 03/31/2021   TRIG 63 03/31/2021   CHOLHDL 3.0 03/31/2021   Lab Results  Component Value Date   WBC 6.0 07/03/2022   HGB 11.3 07/03/2022   HCT 35.3 07/03/2022   MCV 88 07/03/2022   PLT 166 07/03/2022   Lab Results  Component Value Date   IRON 65 07/21/2013   TIBC 290 07/21/2013   FERRITIN 69 07/21/2013   Attestation Statements:   Reviewed by clinician on day of visit: allergies, medications, problem list, medical history, surgical history, family history, social history, and previous encounter notes.  Time  spent on visit including pre-visit chart review and post-visit charting and care was 45 minutes.   I, Burt Knack, am acting as transcriptionist for Quillian Quince, MD.  I have reviewed the above documentation for accuracy and completeness, and I agree with the above. - Quillian Quince, MD

## 2022-10-24 ENCOUNTER — Ambulatory Visit (INDEPENDENT_AMBULATORY_CARE_PROVIDER_SITE_OTHER): Payer: Medicaid Other | Admitting: Family Medicine

## 2022-10-24 LAB — CBC WITH DIFFERENTIAL/PLATELET
Basophils Absolute: 0 10*3/uL (ref 0.0–0.2)
Basos: 0 %
EOS (ABSOLUTE): 0 10*3/uL (ref 0.0–0.4)
Eos: 1 %
Hematocrit: 38 % (ref 34.0–46.6)
Hemoglobin: 11.9 g/dL (ref 11.1–15.9)
Immature Grans (Abs): 0 10*3/uL (ref 0.0–0.1)
Immature Granulocytes: 1 %
Lymphocytes Absolute: 2.6 10*3/uL (ref 0.7–3.1)
Lymphs: 40 %
MCH: 28.4 pg (ref 26.6–33.0)
MCHC: 31.3 g/dL — ABNORMAL LOW (ref 31.5–35.7)
MCV: 91 fL (ref 79–97)
Monocytes Absolute: 0.5 10*3/uL (ref 0.1–0.9)
Monocytes: 7 %
NRBC: 1 % — ABNORMAL HIGH (ref 0–0)
Neutrophils Absolute: 3.4 10*3/uL (ref 1.4–7.0)
Neutrophils: 51 %
Platelets: 158 10*3/uL (ref 150–450)
RBC: 4.19 x10E6/uL (ref 3.77–5.28)
RDW: 14.3 % (ref 11.7–15.4)
WBC: 6.6 10*3/uL (ref 3.4–10.8)

## 2022-10-24 LAB — HEMOGLOBIN A1C
Est. average glucose Bld gHb Est-mCnc: 123 mg/dL
Hgb A1c MFr Bld: 5.9 % — ABNORMAL HIGH (ref 4.8–5.6)

## 2022-10-24 LAB — LIPID PANEL WITH LDL/HDL RATIO
Cholesterol, Total: 202 mg/dL — ABNORMAL HIGH (ref 100–199)
HDL: 65 mg/dL (ref >39)
LDL Chol Calc (NIH): 127 mg/dL — ABNORMAL HIGH (ref 0–99)
LDL/HDL Ratio: 2 ratio (ref 0.0–3.2)
Triglycerides: 56 mg/dL (ref 0–149)
VLDL Cholesterol Cal: 10 mg/dL (ref 5–40)

## 2022-10-24 LAB — CMP14+EGFR
ALT: 12 IU/L (ref 0–32)
AST: 13 IU/L (ref 0–40)
Albumin: 4.3 g/dL (ref 3.9–4.9)
Alkaline Phosphatase: 75 IU/L (ref 44–121)
BUN/Creatinine Ratio: 18 (ref 12–28)
BUN: 16 mg/dL (ref 8–27)
Bilirubin Total: 0.2 mg/dL (ref 0.0–1.2)
CO2: 23 mmol/L (ref 20–29)
Calcium: 9.2 mg/dL (ref 8.7–10.3)
Chloride: 110 mmol/L — ABNORMAL HIGH (ref 96–106)
Creatinine, Ser: 0.89 mg/dL (ref 0.57–1.00)
Globulin, Total: 1.8 g/dL (ref 1.5–4.5)
Glucose: 87 mg/dL (ref 70–99)
Potassium: 4.2 mmol/L (ref 3.5–5.2)
Sodium: 145 mmol/L — ABNORMAL HIGH (ref 134–144)
Total Protein: 6.1 g/dL (ref 6.0–8.5)
eGFR: 74 mL/min/{1.73_m2} (ref >59)

## 2022-10-24 LAB — TSH: TSH: 2.19 u[IU]/mL (ref 0.450–4.500)

## 2022-10-24 LAB — INSULIN, RANDOM: INSULIN: 5 u[IU]/mL (ref 2.6–24.9)

## 2022-10-24 LAB — VITAMIN D 25 HYDROXY (VIT D DEFICIENCY, FRACTURES): Vit D, 25-Hydroxy: 71.9 ng/mL (ref 30.0–100.0)

## 2022-10-24 LAB — VITAMIN B12: Vitamin B-12: >2000 pg/mL — ABNORMAL HIGH (ref 232–1245)

## 2022-10-26 ENCOUNTER — Other Ambulatory Visit: Payer: Medicaid Other | Admitting: *Deleted

## 2022-10-26 NOTE — Patient Outreach (Signed)
Care Management/Care Coordination  RN Case Manager Case Closure Note  10/26/2022 Name: Tamara [name] MRN: [id number] DOB: 07/05/1960  Tamara [name] is a 62 y.o. year old female who is a primary care patient of [name], DO. The care management/care coordination team was consulted for assistance with chronic disease management and/or care coordination needs.   Care Plan : RN Care Manager Plan Of Care  Updates made by Heidi Dach, RN since 10/26/2022 12:00 AM  Completed 10/26/2022   Problem: Knowledge Deficit and Care Coordination Needs Related to Management of Multiple Sclerosis Resolved 10/26/2022  Priority: High     Long-Range Goal: Development of Plan Of Care to Address Care Coordination Needs and Knowledge Deficits for Management of Multiple Sclerosis, prediabetes and HLD Completed 10/26/2022  Start Date: 01/21/2021  Expected End Date: 10/27/2022  Priority: High  Note:   Current Barriers:  Knowledge Deficits related to plan of care for management of Multiple Sclerosis, prediabetes and HLD  Care Coordination needs related to Lacks knowledge of community resource: family psychiatric evaluation to assist with securing custody of granddaughter [name]  Ms. [name] is working to manage her weight and attending visits with [facility]. Working to locate Dental office to accommodate all dental needs.   RNCM Clinical Goal(s):  Patient will demonstrate ongoing adherence to prescribed treatment plan for multiple sclerosis as evidenced by no inpatient hospitalizations or ED visits due to MS exacerbation or patient reports of MS symptom exacerbation through collaboration with RN Care manager, provider, and care team.   Interventions: Inter-disciplinary care team collaboration (see longitudinal plan of care) Evaluation of current treatment plan related to  self management and patient's adherence to plan as established by provider Provided therapeutic listening Provided patient with  [facility] [phone], verify office is in network and schedule an office visit Advised patient to reach out to [facility] [phone] for member benefits/dental coverage   Advised patient to follow up with [name] Advised patient to contact Medicaid Ombudsman [phone]   Weight Loss Interventions:  (Status:  Goal Met.) Long Term Goal- patient working with [facility] and understands established plan Advised patient to discuss with primary care provider options regarding weight management Reviewed recommended dietary changes: avoid fad diets, make small/incremental dietary and exercise changes, eat at the table and avoid eating in front of the TV, plan management of cravings, monitor snacking and cravings in food diary   Reviewed upcoming appointments with [facility]   Hyperlipidemia Interventions:  (Status:  Goal Met.) Long Term Goal-  Medication review performed; medication list updated in electronic medical record.  Counseled on importance of regular laboratory monitoring as prescribed   Reviewed upcoming appointments including scheduling follow up with Neurology and PCP   Prediabetes  (Status:  Goal Met.)  Long Term Goal-Patient has maintained A1C of 5.9, now working with [facility] for weight loss Evaluation of current treatment plan related to  prediabetes ,  self-management and patient's adherence to plan as established by provider. Discussed plans with patient for ongoing care management follow up and provided patient with direct contact information for care management team Discussed the importance of weight loss, healthy diet, and exercise Provided encouragement to continue with the dietary changes and exercise routine   Patient Goals/Self-Care Activities: Take medications as prescribed   Attend all scheduled provider appointments Call pharmacy for medication refills 3-7 days in advance of running out of medications Perform  all self care activities independently  Perform IADL's (shopping, preparing meals, housekeeping, managing finances) independently Call provider office for new concerns or questions  Call health plan member services to discuss benefits details specific to patient's needs Schedule colonoscopy         Plan: The patient has met all care management goals, agreed to case closure, and has been provided with contact information for the care management team. Appropriate care team members and provider have been notified via electronic communication. The care management team is available to at any time in the future should needs arise.   [name] RN, BSN [place]  [facility] RN Care Coordinator [phone]

## 2022-10-27 ENCOUNTER — Non-Acute Institutional Stay (HOSPITAL_COMMUNITY)
Admission: RE | Admit: 2022-10-27 | Discharge: 2022-10-27 | Disposition: A | Discharge status: Home / Self Care | Payer: Medicaid Other | Source: Ambulatory Visit | Attending: Internal Medicine | Admitting: Internal Medicine

## 2022-10-27 DIAGNOSIS — G35 Multiple sclerosis: Secondary | ICD-10-CM | POA: Insufficient documentation

## 2022-10-27 MED ORDER — SODIUM CHLORIDE 0.9 % IV SOLN: INTRAVENOUS | Status: DC | PRN

## 2022-10-27 MED ORDER — SODIUM CHLORIDE 0.9 % IV SOLN
300.0000 mg | INTRAVENOUS | Status: DC
  Administered 2022-10-27: 300 mg via INTRAVENOUS
  Filled 2022-10-27: qty 15

## 2022-10-27 NOTE — Progress Notes (Signed)
PATIENT CARE CENTER NOTE   Diagnosis: Multiple Sclerosis ( HCC) [G35]      Provider: Despina Arias, MD     Procedure: Donnamarie Poag 300mg  infusion     Note: Patient received Tysabri infusion (dose # 5 of 5)  via PIV. No premeds required per orders. Tolerated infusion well with no adverse reaction. Vital signs stable. Patient declined AVS. Patient observed for 1 hour post-infusion. Patient instructed to schedule her monthly appointment at front desk prior to leaving, verbalized understanding.  Alert, oriented and ambulatory at discharge.

## 2022-11-03 ENCOUNTER — Other Ambulatory Visit: Payer: Self-pay | Admitting: Internal Medicine

## 2022-11-03 DIAGNOSIS — Z1212 Encounter for screening for malignant neoplasm of rectum: Secondary | ICD-10-CM

## 2022-11-03 DIAGNOSIS — Z1211 Encounter for screening for malignant neoplasm of colon: Secondary | ICD-10-CM

## 2022-11-06 ENCOUNTER — Ambulatory Visit (INDEPENDENT_AMBULATORY_CARE_PROVIDER_SITE_OTHER): Payer: Medicaid Other | Admitting: Family Medicine

## 2022-11-06 ENCOUNTER — Encounter (INDEPENDENT_AMBULATORY_CARE_PROVIDER_SITE_OTHER): Payer: Self-pay | Admitting: Family Medicine

## 2022-11-06 VITALS — BP 126/72 | HR 64 | Temp 98.2°F | Ht 63.5 in | Wt 194.0 lb

## 2022-11-06 DIAGNOSIS — E669 Obesity, unspecified: Secondary | ICD-10-CM | POA: Insufficient documentation

## 2022-11-06 DIAGNOSIS — Z6833 Body mass index (BMI) 33.0-33.9, adult: Secondary | ICD-10-CM

## 2022-11-06 DIAGNOSIS — E7800 Pure hypercholesterolemia, unspecified: Secondary | ICD-10-CM | POA: Diagnosis not present

## 2022-11-06 DIAGNOSIS — E538 Deficiency of other specified B group vitamins: Secondary | ICD-10-CM

## 2022-11-06 DIAGNOSIS — E559 Vitamin D deficiency, unspecified: Secondary | ICD-10-CM

## 2022-11-06 DIAGNOSIS — R7303 Prediabetes: Secondary | ICD-10-CM | POA: Diagnosis not present

## 2022-11-06 MED ORDER — METFORMIN HCL 500 MG PO TABS
500.0000 mg | ORAL_TABLET | Freq: Every day | ORAL | 0 refills | Status: DC
Start: 2022-11-06 — End: 2022-12-20

## 2022-11-06 NOTE — Progress Notes (Signed)
Chief Complaint:   OBESITY Anyiah is here to discuss her progress with her obesity treatment plan along with follow-up of her obesity related diagnoses. Zelina is on the Category 1 Plan and states she is following her eating plan approximately 0% of the time. Kamia states she is doing 0 minutes 0 times per week.  Today's visit was #: 2 Starting weight: 194 lbs Starting date: 10/23/2022 Today's weight: 194 lbs Today's date: 11/06/2022 Total lbs lost to date: 0 Total lbs lost since last in-office visit: 0  Interim History: Patient's weight today was set on "weight only" as the bioimpedance function does not work when wearing pantyhose.  Patient tried to start her plan but she had unexpected company and got derailed.  She is working on getting back on track.  Subjective:   1. Pure hypercholesterolemia Patient's LDL is elevated, and she is not on a statin.  She denies chest pain.  2. Vitamin D deficiency Patient is on OTC vitamin D.  She is on 8000 IU once daily and her level is starting to come close to being over replaced.  3. B12 deficiency Patient's B12 is over replaced.  She is on B12 sublingual and B complex.  4. Prediabetes Patient's A1c is elevated at 5.9.  She is not on metformin, and she is struggling with her diet and weight loss.  Assessment/Plan:   1. Pure hypercholesterolemia Patient will continue with her diet and exercise, and we will recheck labs in 3 months.  2. Vitamin D deficiency Patient is to decrease vitamin D to 6000 IU once daily, and we will recheck labs in 3 months.  3. B12 deficiency Patient is okay to decrease B12 to every other day.  She can continue to take it daily but she is unlikely to be better than every other day.  4. Prediabetes Patient agreed to start metformin 500 mg every morning with food, with no refills.  We will follow-up at her next visit in 2 weeks.  - metFORMIN (GLUCOPHAGE) 500 MG tablet; Take 1 tablet (500 mg total) by mouth  daily with breakfast.  Dispense: 30 tablet; Refill: 0  5. BMI 33.0-33.9,adult  6. Obesity, Beginning BMI 33.9 Danisa is currently in the action stage of change. As such, her goal is to continue with weight loss efforts. She has agreed to the Category 1 Plan.   Behavioral modification strategies: increasing lean protein intake and meal planning and cooking strategies.  Saide has agreed to follow-up with our clinic in 2 weeks. She was informed of the importance of frequent follow-up visits to maximize her success with intensive lifestyle modifications for her multiple health conditions.   Objective:   Blood pressure 126/72, pulse 64, temperature 98.2 F (36.8 C), height 5' 3.5" (1.613 m), weight 194 lb (88 kg), SpO2 100%. Body mass index is 33.83 kg/m.  Lab Results  Component Value Date   CREATININE 0.89 10/23/2022   BUN 16 10/23/2022   NA 145 (H) 10/23/2022   K 4.2 10/23/2022   CL 110 (H) 10/23/2022   CO2 23 10/23/2022   Lab Results  Component Value Date   ALT 12 10/23/2022   AST 13 10/23/2022   ALKPHOS 75 10/23/2022   BILITOT 0.2 10/23/2022   Lab Results  Component Value Date   HGBA1C 5.9 (H) 10/23/2022   HGBA1C 5.9 (H) 03/31/2021   Lab Results  Component Value Date   INSULIN 5.0 10/23/2022   Lab Results  Component Value Date   TSH  2.190 10/23/2022   Lab Results  Component Value Date   CHOL 202 (H) 10/23/2022   HDL 65 10/23/2022   LDLCALC 127 (H) 10/23/2022   TRIG 56 10/23/2022   CHOLHDL 3.0 03/31/2021   Lab Results  Component Value Date   VD25OH 71.9 10/23/2022   VD25OH 63.1 11/17/2021   VD25OH 60.3 03/31/2021   Lab Results  Component Value Date   WBC 6.6 10/23/2022   HGB 11.9 10/23/2022   HCT 38.0 10/23/2022   MCV 91 10/23/2022   PLT 158 10/23/2022   Lab Results  Component Value Date   IRON 65 07/21/2013   TIBC 290 07/21/2013   FERRITIN 69 07/21/2013   Attestation Statements:   Reviewed by clinician on day of visit: allergies, medications,  problem list, medical history, surgical history, family history, social history, and previous encounter notes.  Time spent on visit including pre-visit chart review and post-visit care and charting was 40 minutes.   I, Burt Knack, am acting as transcriptionist for Quillian Quince, MD.  I have reviewed the above documentation for accuracy and completeness, and I agree with the above. -  .mmm

## 2022-11-12 ENCOUNTER — Other Ambulatory Visit: Payer: Self-pay | Admitting: Neurology

## 2022-11-20 ENCOUNTER — Other Ambulatory Visit: Payer: Self-pay

## 2022-11-20 ENCOUNTER — Telehealth: Payer: Self-pay | Admitting: *Deleted

## 2022-11-20 ENCOUNTER — Ambulatory Visit (INDEPENDENT_AMBULATORY_CARE_PROVIDER_SITE_OTHER): Payer: Medicaid Other | Admitting: Physician Assistant

## 2022-11-20 DIAGNOSIS — G35 Multiple sclerosis: Secondary | ICD-10-CM

## 2022-11-20 NOTE — Telephone Encounter (Signed)
Called [employer]/Medicaid at [phone] and spoke with automated system.  PA not needed for J2323/Dx: G35 at all places of service. Patient ID: [id number] M. Initiated PA Tysabri. Jcode: Q6578.    Pt receives at [facility]. NPI: [id number]. Tax ID: [id number]. VOZ#[id number].   Janalyn Harder, CMA placed order in EPIC for upcoming infusion on Friday per [facility] request.

## 2022-11-20 NOTE — Progress Notes (Deleted)
.smr  Office: [phone]  /  Fax: [phone]  WEIGHT SUMMARY AND BIOMETRICS  No data recorded No data recorded No data recorded No data recorded   HPI  Chief Complaint: OBESITY  Tamara [name] is here to discuss her progress with her obesity treatment plan. She is on the {MWMwtlossportion/plan2:23431} and states she is following her eating plan approximately *** % of the time. She states she is exercising *** minutes *** times per week.   Interval History:  Since last office visit she *** Hunger/appetite-*** Cravings- *** Stress- *** Sleep- *** Exercise-*** Hydration-***   Pharmacotherapy: ***  TREATMENT PLAN FOR OBESITY:  Recommended Dietary Goals  Tamara [name] is currently in the action stage of change. As such, her goal is to continue weight management plan. She has agreed to {MWMwtlossportion/plan2:23431}.  Behavioral Intervention  We discussed the following Behavioral Modification Strategies today: {EMWMwtlossstrategies:28914::"continue to work on maintaining a reduced calorie state, getting the recommended amount of protein, incorporating whole foods, making healthy choices, staying well hydrated and practicing mindfulness when eating."}.  Additional resources provided today: NA  Recommended Physical Activity Goals  Tamara [name] has been advised to work up to 150 minutes of moderate intensity aerobic activity a week and strengthening exercises 2-3 times per week for cardiovascular health, weight loss maintenance and preservation of muscle mass.   She has agreed to {EMEXERCISE:28847::"Think about enjoyable ways to increase daily physical activity and overcoming barriers to exercise","Increase physical activity in their day and reduce sedentary time (increase NEAT)."}   Pharmacotherapy We discussed various medication options to help Tamara [name] with her weight loss efforts and we both agreed to ***.    No follow-ups on file.Marland Kitchen She was informed of the importance of frequent follow up  visits to maximize her success with intensive lifestyle modifications for her multiple health conditions.  PHYSICAL EXAM:  There were no vitals taken for this visit. There is no height or weight on file to calculate BMI.  General: She is overweight, cooperative, alert, well developed, and in no acute distress. PSYCH: Has normal mood, affect and thought process.   Lungs: Normal breathing effort, no conversational dyspnea.  DIAGNOSTIC DATA REVIEWED:  BMET    Component Value Date/Time   NA 145 (H) 10/23/2022 1216   K 4.2 10/23/2022 1216   CL 110 (H) 10/23/2022 1216   CO2 23 10/23/2022 1216   GLUCOSE 87 10/23/2022 1216   GLUCOSE 94 09/07/2020 1405   BUN 16 10/23/2022 1216   CREATININE 0.89 10/23/2022 1216   CREATININE 1.01 07/08/2013 1440   CALCIUM 9.2 10/23/2022 1216   GFRNONAA >60 09/07/2020 1405   GFRNONAA 64 07/08/2013 1440   GFRAA >60 07/10/2019 0519   GFRAA 74 07/08/2013 1440   Lab Results  Component Value Date   HGBA1C 5.9 (H) 10/23/2022   HGBA1C 5.9 (H) 03/31/2021   Lab Results  Component Value Date   INSULIN 5.0 10/23/2022   Lab Results  Component Value Date   TSH 2.190 10/23/2022   CBC    Component Value Date/Time   WBC 6.6 10/23/2022 1216   WBC 7.0 09/07/2020 1405   RBC 4.19 10/23/2022 1216   RBC 4.00 09/07/2020 1405   HGB 11.9 10/23/2022 1216   HCT 38.0 10/23/2022 1216   PLT 158 10/23/2022 1216   MCV 91 10/23/2022 1216   MCH 28.4 10/23/2022 1216   MCH 28.5 09/07/2020 1405   MCHC 31.3 (L) 10/23/2022 1216   MCHC 31.6 09/07/2020 1405   RDW 14.3 10/23/2022 1216   Iron Studies  Component Value Date/Time   IRON 65 07/21/2013 1519   TIBC 290 07/21/2013 1519   FERRITIN 69 07/21/2013 1519   IRONPCTSAT 22 07/21/2013 1519   Lipid Panel     Component Value Date/Time   CHOL 202 (H) 10/23/2022 1216   TRIG 56 10/23/2022 1216   HDL 65 10/23/2022 1216   CHOLHDL 3.0 03/31/2021 1058   LDLCALC 127 (H) 10/23/2022 1216   Hepatic Function Panel      Component Value Date/Time   PROT 6.1 10/23/2022 1216   ALBUMIN 4.3 10/23/2022 1216   AST 13 10/23/2022 1216   ALT 12 10/23/2022 1216   ALKPHOS 75 10/23/2022 1216   BILITOT 0.2 10/23/2022 1216      Component Value Date/Time   TSH 2.190 10/23/2022 1216   Nutritional Lab Results  Component Value Date   VD25OH 71.9 10/23/2022   VD25OH 63.1 11/17/2021   VD25OH 60.3 03/31/2021    ASSOCIATED CONDITIONS ADDRESSED TODAY  ASSESSMENT AND PLAN  Problem List Items Addressed This Visit     B12 deficiency   Prediabetes   Obesity, Beginning BMI 33.9   Vitamin D deficiency - Primary (Chronic)    ATTESTASTION STATEMENTS:  Reviewed by clinician on day of visit: allergies, medications, problem list, medical history, surgical history, family history, social history, and previous encounter notes.   I have personally spent 30 minutes total time today in preparation, patient care, nutritional counseling and documentation for this visit, including the following: review of clinical lab tests; review of medical tests/procedures/services.      Tamara Amezcua, PA-C

## 2022-11-21 ENCOUNTER — Ambulatory Visit (INDEPENDENT_AMBULATORY_CARE_PROVIDER_SITE_OTHER): Payer: Medicaid Other | Admitting: Family Medicine

## 2022-11-21 ENCOUNTER — Encounter (INDEPENDENT_AMBULATORY_CARE_PROVIDER_SITE_OTHER): Payer: Self-pay | Admitting: Family Medicine

## 2022-11-21 VITALS — BP 127/79 | HR 71 | Temp 99.1°F | Ht 63.5 in | Wt 194.0 lb

## 2022-11-21 DIAGNOSIS — E538 Deficiency of other specified B group vitamins: Secondary | ICD-10-CM

## 2022-11-21 DIAGNOSIS — E559 Vitamin D deficiency, unspecified: Secondary | ICD-10-CM

## 2022-11-21 DIAGNOSIS — E66811 Obesity, class 1: Secondary | ICD-10-CM

## 2022-11-21 DIAGNOSIS — R7303 Prediabetes: Secondary | ICD-10-CM | POA: Diagnosis not present

## 2022-11-21 DIAGNOSIS — Z6833 Body mass index (BMI) 33.0-33.9, adult: Secondary | ICD-10-CM

## 2022-11-21 NOTE — Progress Notes (Signed)
Carlye Grippe, D.O.  ABFM, ABOM Specializing in Clinical Bariatric Medicine  Office located at: [address] [place], [place]  [address]   Assessment and Plan:   Medications Discontinued During This Encounter  Medication Reason   hyoscyamine (LEVSIN) 0.125 MG tablet    LORazepam (ATIVAN) 1 MG tablet    Multiple Vitamins-Minerals (HAIR/SKIN/NAILS) CAPS    Multiple Vitamins-Minerals (ONE-A-DAY WOMENS PO)    tiZANidine (ZANAFLEX) 4 MG tablet    cyanocobalamin (VITAMIN B12) 1000 MCG tablet    Prediabetes Assessment & Plan: Most recent Hemoglobin A1c of 5.9 on 10/23/22. Pt prescribed Metformin by Dr.Beasley at last OV. Pt did not start the medicine yet because she was worried about "passing out". Pt reports that she does not have hunger or carb cravings.  Since pt has difficulty eating all the food on her meal plan & is skipping meals; I don't recommend she initiate Metformin at this time. She will focus on trying to eat all her foods without skipping meals & measuring veggies fruits, and proteins. Continue with weight loss therapy.    Vitamin D deficiency Assessment & Plan: Most recent vitamin D of 71.9 on 10/23/22. Prior to labs being drawn, pt reports taking 4 tablets of OTC vitamin D 1,000 units daily. After labs on 10/23/22, pt reduced to 2 tablets daily. Continue current supplementation regiment and we will recheck levels in 2-3 mos.   B12 deficiency Assessment & Plan: Lab Results  Component Value Date   VITAMINB12 >2000 (H) 10/23/2022   B12 levels are elevated per labs on 9/23. Pt instructed to take her B12 complex every day and discontinue sublingual B12.  Will continue to monitor condition.    Class 1 obesity due to excess calories with serious comorbidity and body mass index (BMI) of 33.0 to 33.9 in adult Assessment & Plan: Since last office visit on 10/23/22 patient's Muscle mass has increased by 0.8 lb. Fat mass has decreased by 1.2 lb. Total body water has  decreased by 0.4 lb.  Counseling done on how various foods will affect these numbers and how to maximize success  Total lbs lost to date: 0 Total weight loss percentage to date: 0  No change to meal plan - see Subjective  Recommended pt to have 30 grams of protein with each meal.   Reviewed different sources of lean protein that she can incorporate into her meal plan.   I reviewed the ideal Healthy Eating Plate with the pt: 1/2 plate of lean protein and other half of plate is complex carbs, fruits,& veggies.     Behavioral Intervention Additional resources provided today: category 1 meal plan information, Heathy Eating principles Handout.  Evidence-based interventions for health behavior change were utilized today including the discussion of self monitoring techniques, problem-solving barriers and SMART goal setting techniques.   Regarding patient's less desirable eating habits and patterns, we employed the technique of small changes.  Pt will specifically work on: n/a  FOLLOW UP: Return 12/20/22. She was informed of the importance of frequent follow up visits to maximize her success with intensive lifestyle modifications for her multiple health conditions.  Subjective:   Chief complaint: Obesity Tamara [name] is here to discuss her progress with her obesity treatment plan. She is on the Category 1 Plan and states she is following her eating plan approximately 50% of the time. She states she is using "vibrator machine" 30 minutes 2 days per week.  Interval History:  Tamara [name] is here for a  follow up office visit. Tamara [name] is a pleasant 62 y.o female affected by obesity. This is my first encounter with Tamara [name]; she has been seeing Dr.Beasley. Pt did not start Metformin, which was prescribed by Dr.Beasley at last OV, because she was worried about "passing out". Reports skipping meals and not being able to eat all the foods on the meal plan.  Pharmacotherapy: She is currently taking Topamax  50 mg tid & Phentermine 37.5 mg daily per Dr.Sater of Neurology. She has been taking both meds prior to coming to see [place].   Review of Systems:  Pertinent positives were addressed with patient today.  Reviewed by clinician on day of visit: allergies, medications, problem list, medical history, surgical history, family history, social history, and previous encounter notes.  Weight Summary and Biometrics   Weight Lost Since Last Visit: 0lb  Weight Gained Since Last Visit: 0lb    Vitals Temp: 99.1 F (37.3 C) BP: 127/79 Pulse Rate: 71 SpO2: 100 %   Anthropometric Measurements Height: 5' 3.5" (1.613 m) Weight: 194 lb (88 kg) BMI (Calculated): 33.82 Weight at Last Visit: 194lb Weight Lost Since Last Visit: 0lb Weight Gained Since Last Visit: 0lb Starting Weight: 194lb Total Weight Loss (lbs): 0 lb (0 kg) Peak Weight: 223lb   Body Composition  Body Fat %: 42.3 % Fat Mass (lbs): 82 lbs Muscle Mass (lbs): 106.4 lbs Total Body Water (lbs): 89 lbs Visceral Fat Rating : 12   Other Clinical Data Fasting: no Labs: no Today's Visit #: 3 Starting Date: 10/23/22   Objective:   PHYSICAL EXAM: Blood pressure 127/79, pulse 71, temperature 99.1 F (37.3 C), height 5' 3.5" (1.613 m), weight 194 lb (88 kg), SpO2 100%. Body mass index is 33.83 kg/m.  General: Well Developed, well nourished, and in no acute distress.  HEENT: Normocephalic, atraumatic Skin: Warm and dry, cap RF less 2 sec, good turgor Chest:  Normal excursion, shape, no gross abn Respiratory: speaking in full sentences, no conversational dyspnea NeuroM-Sk: Ambulates w/o assistance, moves * 4 Psych: A and O *3, insight good, mood-full  DIAGNOSTIC DATA REVIEWED:  BMET    Component Value Date/Time   NA 145 (H) 10/23/2022 1216   K 4.2 10/23/2022 1216   CL 110 (H) 10/23/2022 1216   CO2 23 10/23/2022 1216   GLUCOSE 87 10/23/2022 1216   GLUCOSE 94 09/07/2020 1405   BUN 16 10/23/2022 1216   CREATININE 0.89  10/23/2022 1216   CREATININE 1.01 07/08/2013 1440   CALCIUM 9.2 10/23/2022 1216   GFRNONAA >60 09/07/2020 1405   GFRNONAA 64 07/08/2013 1440   GFRAA >60 07/10/2019 0519   GFRAA 74 07/08/2013 1440   Lab Results  Component Value Date   HGBA1C 5.9 (H) 10/23/2022   HGBA1C 5.9 (H) 03/31/2021   Lab Results  Component Value Date   INSULIN 5.0 10/23/2022   Lab Results  Component Value Date   TSH 2.190 10/23/2022   CBC    Component Value Date/Time   WBC 6.6 10/23/2022 1216   WBC 7.0 09/07/2020 1405   RBC 4.19 10/23/2022 1216   RBC 4.00 09/07/2020 1405   HGB 11.9 10/23/2022 1216   HCT 38.0 10/23/2022 1216   PLT 158 10/23/2022 1216   MCV 91 10/23/2022 1216   MCH 28.4 10/23/2022 1216   MCH 28.5 09/07/2020 1405   MCHC 31.3 (L) 10/23/2022 1216   MCHC 31.6 09/07/2020 1405   RDW 14.3 10/23/2022 1216   Iron Studies    Component Value Date/Time  IRON 65 07/21/2013 1519   TIBC 290 07/21/2013 1519   FERRITIN 69 07/21/2013 1519   IRONPCTSAT 22 07/21/2013 1519   Lipid Panel     Component Value Date/Time   CHOL 202 (H) 10/23/2022 1216   TRIG 56 10/23/2022 1216   HDL 65 10/23/2022 1216   CHOLHDL 3.0 03/31/2021 1058   LDLCALC 127 (H) 10/23/2022 1216   Hepatic Function Panel     Component Value Date/Time   PROT 6.1 10/23/2022 1216   ALBUMIN 4.3 10/23/2022 1216   AST 13 10/23/2022 1216   ALT 12 10/23/2022 1216   ALKPHOS 75 10/23/2022 1216   BILITOT 0.2 10/23/2022 1216      Component Value Date/Time   TSH 2.190 10/23/2022 1216   Nutritional Lab Results  Component Value Date   VD25OH 71.9 10/23/2022   VD25OH 63.1 11/17/2021   VD25OH 60.3 03/31/2021    Attestations:   I, Special Puri, acting as a Stage manager for [employer], DO., have compiled all relevant documentation for today's office visit on behalf of Thomasene Lot, DO, while in the presence of [employer], DO.  I have reviewed the above documentation for accuracy and completeness, and I agree  with the above. Carlye Grippe, D.O.  The 21st Century Cures Act was signed into law in 2016 which includes the topic of electronic health records.  This provides immediate access to information in MyChart.  This includes consultation notes, operative notes, office notes, lab results and pathology reports.  If you have any questions about what you read please let us know at your next visit so we can discuss your concerns and take corrective action if need be.  We are right here with you.

## 2022-11-24 ENCOUNTER — Non-Acute Institutional Stay (HOSPITAL_COMMUNITY)
Admission: RE | Admit: 2022-11-24 | Discharge: 2022-11-24 | Disposition: A | Discharge status: Home / Self Care | Payer: Medicaid Other | Source: Ambulatory Visit | Attending: Internal Medicine | Admitting: Internal Medicine

## 2022-11-24 DIAGNOSIS — G35 Multiple sclerosis: Secondary | ICD-10-CM | POA: Diagnosis present

## 2022-11-24 MED ORDER — SODIUM CHLORIDE 0.9 % IV SOLN: INTRAVENOUS | Status: DC | PRN

## 2022-11-24 MED ORDER — SODIUM CHLORIDE 0.9 % IV SOLN
300.0000 mg | INTRAVENOUS | Status: DC
  Administered 2022-11-24: 300 mg via INTRAVENOUS
  Filled 2022-11-24: qty 15

## 2022-11-24 NOTE — Progress Notes (Signed)
PATIENT CARE CENTER NOTE    Diagnosis: Multiple Sclerosis ( HCC) [G35]      Provider: Despina Arias, MD     Procedure: Donnamarie Poag 300mg  infusion     Note: Patient received Tysabri infusion (dose # 1 of 2)  via PIV. No premeds required per orders. Tolerated infusion well with no adverse reaction. Vital signs stable.  Patient observed for 1 hour post-infusion. Printed AVS offered but patient refused. Patient instructed to schedule her monthly appointment at front desk prior to leaving, verbalized understanding.  Alert, oriented and ambulatory at discharge.

## 2022-11-28 ENCOUNTER — Other Ambulatory Visit: Payer: Self-pay

## 2022-11-28 MED ORDER — PANTOPRAZOLE SODIUM 40 MG PO TBEC: 40.0000 mg | DELAYED_RELEASE_TABLET | Freq: Every day | ORAL | 0 refills | Status: DC

## 2022-12-01 HISTORY — PX: DENTAL SURGERY: SHX609

## 2022-12-03 ENCOUNTER — Other Ambulatory Visit (INDEPENDENT_AMBULATORY_CARE_PROVIDER_SITE_OTHER): Payer: Self-pay | Admitting: Family Medicine

## 2022-12-03 DIAGNOSIS — R7303 Prediabetes: Secondary | ICD-10-CM

## 2022-12-07 ENCOUNTER — Telehealth (INDEPENDENT_AMBULATORY_CARE_PROVIDER_SITE_OTHER): Payer: Self-pay | Admitting: Family Medicine

## 2022-12-07 NOTE — Telephone Encounter (Signed)
Please call patient and advise her to follow her dentists advice while healing from surgery but ok to increase soft protein rich foods like greek yogurt, cottage cheese, eggs, tuna pouches and milk. Protein shakes ok for now as well.

## 2022-12-07 NOTE — Telephone Encounter (Signed)
Pt called stating that she had emergency dental surgery on yesterday. Pt wants to speak with Dr. Dalbert Garnet concerning the meal plan that she is on, because it counteracts with her dental surgery. Pt has requested a call today.

## 2022-12-07 NOTE — Telephone Encounter (Signed)
Attempted to call patient and leave a message. Mailbox full.  Will send a my chart message.

## 2022-12-11 NOTE — Telephone Encounter (Signed)
Called patient and gave her Dr Francena Hanly message. Patient verbalized understanding. No other issues.  Call ended.

## 2022-12-19 ENCOUNTER — Ambulatory Visit (INDEPENDENT_AMBULATORY_CARE_PROVIDER_SITE_OTHER): Payer: Medicaid Other | Admitting: Adult Health

## 2022-12-20 ENCOUNTER — Encounter (INDEPENDENT_AMBULATORY_CARE_PROVIDER_SITE_OTHER): Payer: Self-pay | Admitting: Family Medicine

## 2022-12-20 ENCOUNTER — Ambulatory Visit (INDEPENDENT_AMBULATORY_CARE_PROVIDER_SITE_OTHER): Payer: Medicaid Other | Admitting: Family Medicine

## 2022-12-20 ENCOUNTER — Other Ambulatory Visit (INDEPENDENT_AMBULATORY_CARE_PROVIDER_SITE_OTHER): Payer: Self-pay | Admitting: Family Medicine

## 2022-12-20 VITALS — BP 109/67 | HR 79 | Temp 98.3°F | Ht 63.5 in | Wt 191.0 lb

## 2022-12-20 DIAGNOSIS — E669 Obesity, unspecified: Secondary | ICD-10-CM

## 2022-12-20 DIAGNOSIS — R7303 Prediabetes: Secondary | ICD-10-CM

## 2022-12-20 DIAGNOSIS — Z6833 Body mass index (BMI) 33.0-33.9, adult: Secondary | ICD-10-CM | POA: Diagnosis not present

## 2022-12-20 DIAGNOSIS — G35 Multiple sclerosis: Secondary | ICD-10-CM

## 2022-12-20 MED ORDER — METFORMIN HCL 500 MG PO TABS
500.0000 mg | ORAL_TABLET | Freq: Every day | ORAL | 0 refills | Status: DC
Start: 2022-12-20 — End: 2023-01-17

## 2022-12-20 NOTE — Progress Notes (Signed)
.[facility]  Office: [phone]  /  Fax: [phone]  WEIGHT SUMMARY AND BIOMETRICS  Anthropometric Measurements Height: 5' 3.5" (1.613 m) Weight: 191 lb (86.6 kg) BMI (Calculated): 33.3 Weight at Last Visit: 194 lb Weight Lost Since Last Visit: 3 lb Weight Gained Since Last Visit: 0 Starting Weight: 194 lb Total Weight Loss (lbs): 3 lb (1.361 kg) Peak Weight: 223 lb   Body Composition  Body Fat %: 41.6 % Fat Mass (lbs): 79.6 lbs Muscle Mass (lbs): 106 lbs Total Body Water (lbs): 92.2 lbs Visceral Fat Rating : 12   Other Clinical Data Fasting: no Labs: no Today's Visit #: 4 Starting Date: 10/02/22    Chief Complaint: OBESITY    History of Present Illness   The patient, diagnosed with prediabetes and obesity, has been actively working on diet, exercise, and weight loss as part of her treatment plan. She reported a weight loss of three pounds since the last visit a month ago. However, adherence to the prescribed category one eating plan was disrupted due to emergency dental surgery. The patient has been consuming soft protein-rich foods such as yogurt, cottage cheese, tuna, milk, and scrambled eggs. She was prescribed metformin for prediabetes but has not started it yet. She is also on topiramate and high dose phentermine, managed by another physician.  The patient experienced severe dental issues, starting with pain on the third day after leaving the last visit. She was put on a seven-day course of antibiotics by her dentist. After the course, the patient's tooth started chipping and eventually cracked, leaving a hole and causing unbearable pain. The dentist diagnosed an abscess and prescribed a ten-day course of penicillin, which only took effect on the ninth day. The patient had to take high doses of Advil and Tylenol every three hours to manage the pain. She underwent dental surgery on the tenth day, which required nine shots to numb the area due to her MS. Post-surgery, the  patient experienced persistent numbness and had a bridge put on, which later fell off. She is scheduled to have it replaced.  The patient also reported swelling in her hands, which she suspects might be an allergic reaction to penicillin. She has been managing her diet as best as she can, given her dental issues, and has been consuming foods like broccoli, chicken, peanut butter, and eggs. She has also been taking metformin, which she reported makes her eat and drink more water. She has been trying to finish eating by seven o'clock as eating late seems to cause discomfort. The patient is determined to return to her original diet plan once her dental issues are resolved.          PHYSICAL EXAM:  Blood pressure 109/67, pulse 79, temperature 98.3 F (36.8 C), height 5' 3.5" (1.613 m), weight 191 lb (86.6 kg), SpO2 100%. Body mass index is 33.3 kg/m.  DIAGNOSTIC DATA REVIEWED:  BMET    Component Value Date/Time   NA 145 (H) 10/23/2022 1216   K 4.2 10/23/2022 1216   CL 110 (H) 10/23/2022 1216   CO2 23 10/23/2022 1216   GLUCOSE 87 10/23/2022 1216   GLUCOSE 94 09/07/2020 1405   BUN 16 10/23/2022 1216   CREATININE 0.89 10/23/2022 1216   CREATININE 1.01 07/08/2013 1440   CALCIUM 9.2 10/23/2022 1216   GFRNONAA >60 09/07/2020 1405   GFRNONAA 64 07/08/2013 1440   GFRAA >60 07/10/2019 0519   GFRAA 74 07/08/2013 1440   Lab Results  Component Value Date   HGBA1C  5.9 (H) 10/23/2022   HGBA1C 5.9 (H) 03/31/2021   Lab Results  Component Value Date   INSULIN 5.0 10/23/2022   Lab Results  Component Value Date   TSH 2.190 10/23/2022   CBC    Component Value Date/Time   WBC 6.6 10/23/2022 1216   WBC 7.0 09/07/2020 1405   RBC 4.19 10/23/2022 1216   RBC 4.00 09/07/2020 1405   HGB 11.9 10/23/2022 1216   HCT 38.0 10/23/2022 1216   PLT 158 10/23/2022 1216   MCV 91 10/23/2022 1216   MCH 28.4 10/23/2022 1216   MCH 28.5 09/07/2020 1405   MCHC 31.3 (L) 10/23/2022 1216   MCHC 31.6  09/07/2020 1405   RDW 14.3 10/23/2022 1216   Iron Studies    Component Value Date/Time   IRON 65 07/21/2013 1519   TIBC 290 07/21/2013 1519   FERRITIN 69 07/21/2013 1519   IRONPCTSAT 22 07/21/2013 1519   Lipid Panel     Component Value Date/Time   CHOL 202 (H) 10/23/2022 1216   TRIG 56 10/23/2022 1216   HDL 65 10/23/2022 1216   CHOLHDL 3.0 03/31/2021 1058   LDLCALC 127 (H) 10/23/2022 1216   Hepatic Function Panel     Component Value Date/Time   PROT 6.1 10/23/2022 1216   ALBUMIN 4.3 10/23/2022 1216   AST 13 10/23/2022 1216   ALT 12 10/23/2022 1216   ALKPHOS 75 10/23/2022 1216   BILITOT 0.2 10/23/2022 1216      Component Value Date/Time   TSH 2.190 10/23/2022 1216   Nutritional Lab Results  Component Value Date   VD25OH 71.9 10/23/2022   VD25OH 63.1 11/17/2021   VD25OH 60.3 03/31/2021     Assessment and Plan    Prediabetes Prediabetes managed with metformin. Patient has not started metformin despite prescription. Discussed the importance of adherence to medication and dietary modifications. Patient reports difficulty following dietary plan due to recent dental surgery. Emphasized the need for protein-rich soft foods. Patient reports increased water intake and avoidance of high fructose foods and sweets. - Refill metformin - Encourage adherence to metformin - Continue dietary modifications with soft, protein-rich foods (yogurt, cottage cheese, tuna, milk, scrambled eggs)  Obesity Obesity managed with diet, exercise, and weight loss. Patient has lost three pounds in the last month. Currently on topiramate and high-dose phentermine prescribed by Dr. Epimenio Foot. Will continue to let Dr. Epimenio Foot manage these medications. Patient reports difficulty eating due to dental issues but is making efforts to consume protein-rich foods. - Continue current weight loss efforts - Follow up with Dr. Epimenio Foot for management of topiramate and phentermine  Dental Abscess Recent dental abscess  with severe pain, treated with penicillin and emergency dental surgery. Patient reports persistent numbness and complications with a temporary bridge. Suspected allergic reaction to penicillin with swelling in hands and tightness. Advised to verify medication with dentist. Discussed the importance of addressing dental issues to prevent further complications and ensure proper nutrition. - Verify medication with dentist - Follow up with dentist for permanent bridge placement on December 8  General Health Maintenance Discussed the importance of hydration, especially with metformin use. Emphasized the importance of routine health maintenance and monitoring blood sugar levels. - Encourage continued hydration - Avoid high fructose foods and sweets - Monitor blood sugar levels  Follow-up - Follow up in four weeks         I have personally spent 40 minutes total time today in preparation, patient care, and documentation for this visit, including the following: review  of clinical lab tests; review of medical tests/procedures/services.    She was informed of the importance of frequent follow up visits to maximize her success with intensive lifestyle modifications for her multiple health conditions.    Quillian Quince, MD

## 2022-12-22 ENCOUNTER — Non-Acute Institutional Stay (HOSPITAL_COMMUNITY)
Admission: RE | Admit: 2022-12-22 | Discharge: 2022-12-22 | Disposition: A | Discharge status: Home / Self Care | Payer: Medicaid Other | Source: Ambulatory Visit | Attending: Internal Medicine | Admitting: Internal Medicine

## 2022-12-22 DIAGNOSIS — G35 Multiple sclerosis: Secondary | ICD-10-CM | POA: Diagnosis present

## 2022-12-22 MED ORDER — SODIUM CHLORIDE 0.9 % IV SOLN
300.0000 mg | INTRAVENOUS | Status: DC
  Administered 2022-12-22: 300 mg via INTRAVENOUS
  Filled 2022-12-22: qty 15

## 2022-12-22 MED ORDER — SODIUM CHLORIDE 0.9 % IV SOLN: INTRAVENOUS | Status: DC | PRN

## 2022-12-22 NOTE — Progress Notes (Signed)
PATIENT CARE CENTER NOTE    Diagnosis: Multiple Sclerosis ( HCC) [G35]      Provider: Despina Arias, MD     Procedure: Donnamarie Poag 300mg  infusion     Note: Patient received Tysabri infusion (dose # 2 of 2)  via PIV. No premeds required per orders. Pt Tolerated infusion with no adverse reaction. Vital signs stable. Patient observed for 45 minutes post infusion. Printed AVS offered but patient refused. Patient advised to to schedule her next appointment at front desk prior to leaving, verbalized understanding. Pt is alert, oriented and ambulatory at discharge.

## 2023-01-15 ENCOUNTER — Other Ambulatory Visit: Payer: Self-pay

## 2023-01-15 DIAGNOSIS — G35 Multiple sclerosis: Secondary | ICD-10-CM

## 2023-01-15 MED ORDER — SODIUM CHLORIDE 0.9 % IV SOLN
300.0000 mg | INTRAVENOUS | Status: AC
Start: 2023-01-15 — End: 2023-12-17

## 2023-01-17 ENCOUNTER — Encounter (INDEPENDENT_AMBULATORY_CARE_PROVIDER_SITE_OTHER): Payer: Self-pay | Admitting: Family Medicine

## 2023-01-17 ENCOUNTER — Ambulatory Visit (INDEPENDENT_AMBULATORY_CARE_PROVIDER_SITE_OTHER): Payer: Medicaid Other | Admitting: Family Medicine

## 2023-01-17 VITALS — BP 123/59 | HR 81 | Temp 98.2°F | Ht 63.5 in | Wt 189.0 lb

## 2023-01-17 DIAGNOSIS — E669 Obesity, unspecified: Secondary | ICD-10-CM | POA: Diagnosis not present

## 2023-01-17 DIAGNOSIS — K056 Periodontal disease, unspecified: Secondary | ICD-10-CM

## 2023-01-17 DIAGNOSIS — E508 Other manifestations of vitamin A deficiency: Secondary | ICD-10-CM | POA: Diagnosis not present

## 2023-01-17 DIAGNOSIS — Z6832 Body mass index (BMI) 32.0-32.9, adult: Secondary | ICD-10-CM

## 2023-01-17 DIAGNOSIS — R7303 Prediabetes: Secondary | ICD-10-CM | POA: Diagnosis not present

## 2023-01-17 DIAGNOSIS — Z6833 Body mass index (BMI) 33.0-33.9, adult: Secondary | ICD-10-CM

## 2023-01-17 MED ORDER — METFORMIN HCL 500 MG PO TABS
500.0000 mg | ORAL_TABLET | Freq: Every day | ORAL | 0 refills | Status: DC
Start: 2023-01-17 — End: 2023-02-15

## 2023-01-17 NOTE — Progress Notes (Signed)
Office: [phone]  /  Fax: [phone]  WEIGHT SUMMARY AND BIOMETRICS  Anthropometric Measurements Height: 5' 3.5" (1.613 m) Weight: 189 lb (85.7 kg) BMI (Calculated): 32.95 Weight at Last Visit: 191 lb Weight Lost Since Last Visit: 2 lb Weight Gained Since Last Visit: 0 Starting Weight: 194 lb Total Weight Loss (lbs): 5 lb (2.268 kg) Peak Weight: 223 lb   Body Composition  Body Fat %: 44.4 % Fat Mass (lbs): 84.2 lbs Muscle Mass (lbs): 100 lbs Visceral Fat Rating : 12   Other Clinical Data Fasting: no Labs: no Today's Visit #: 5 Starting Date: 10/02/22    Chief Complaint: OBESITY   History of Present Illness   The patient, diagnosed with prediabetes and obesity, presents for a follow-up consultation. She has been on metformin for prediabetes and has been actively working on diet, exercise, and weight loss. Over the past month, she has lost two pounds. She reports adhering to the category one eating plan about sixty-five percent of the time. She is not currently engaged in formal exercise but is using a vibration plate balance machine for about fifteen minutes three times per week.  The patient has been experiencing some difficulties due to the weather, which she describes as causing flare-ups. These flare-ups make it difficult for her to grip things and increase her risk of tripping and falling. She also reports that these flare-ups make daily activities such as bathing and dressing more time-consuming and challenging.  The patient has been trying to follow a diet plan, but has faced challenges due to dental issues. She has been to the dentist multiple times for a bridge that kept coming off and gums that were not healing properly. As a result, she has had difficulty chewing food properly. She has been trying to incorporate different foods into her diet, such as protein shakes, broccoli, chicken, eggs, and salads. However, she has had difficulty with certain foods, such  as salads and soups, due to her dental issues and the high sodium content in soups.  The patient has also been experiencing skin peeling, which she attributes to the weather. She has been using various lotions and creams to manage this issue. Despite these challenges, the patient has been making efforts to manage her health conditions and has expressed a positive attitude towards her progress.          PHYSICAL EXAM:  Blood pressure (!) 123/59, pulse 81, temperature 98.2 F (36.8 C), height 5' 3.5" (1.613 m), weight 189 lb (85.7 kg), SpO2 99%. Body mass index is 32.95 kg/m.  DIAGNOSTIC DATA REVIEWED:  BMET    Component Value Date/Time   NA 145 (H) 10/23/2022 1216   K 4.2 10/23/2022 1216   CL 110 (H) 10/23/2022 1216   CO2 23 10/23/2022 1216   GLUCOSE 87 10/23/2022 1216   GLUCOSE 94 09/07/2020 1405   BUN 16 10/23/2022 1216   CREATININE 0.89 10/23/2022 1216   CREATININE 1.01 07/08/2013 1440   CALCIUM 9.2 10/23/2022 1216   GFRNONAA >60 09/07/2020 1405   GFRNONAA 64 07/08/2013 1440   GFRAA >60 07/10/2019 0519   GFRAA 74 07/08/2013 1440   Lab Results  Component Value Date   HGBA1C 5.9 (H) 10/23/2022   HGBA1C 5.9 (H) 03/31/2021   Lab Results  Component Value Date   INSULIN 5.0 10/23/2022   Lab Results  Component Value Date   TSH 2.190 10/23/2022   CBC    Component Value Date/Time   WBC 6.6 10/23/2022 1216  WBC 7.0 09/07/2020 1405   RBC 4.19 10/23/2022 1216   RBC 4.00 09/07/2020 1405   HGB 11.9 10/23/2022 1216   HCT 38.0 10/23/2022 1216   PLT 158 10/23/2022 1216   MCV 91 10/23/2022 1216   MCH 28.4 10/23/2022 1216   MCH 28.5 09/07/2020 1405   MCHC 31.3 (L) 10/23/2022 1216   MCHC 31.6 09/07/2020 1405   RDW 14.3 10/23/2022 1216   Iron Studies    Component Value Date/Time   IRON 65 07/21/2013 1519   TIBC 290 07/21/2013 1519   FERRITIN 69 07/21/2013 1519   IRONPCTSAT 22 07/21/2013 1519   Lipid Panel     Component Value Date/Time   CHOL 202 (H) 10/23/2022  1216   TRIG 56 10/23/2022 1216   HDL 65 10/23/2022 1216   CHOLHDL 3.0 03/31/2021 1058   LDLCALC 127 (H) 10/23/2022 1216   Hepatic Function Panel     Component Value Date/Time   PROT 6.1 10/23/2022 1216   ALBUMIN 4.3 10/23/2022 1216   AST 13 10/23/2022 1216   ALT 12 10/23/2022 1216   ALKPHOS 75 10/23/2022 1216   BILITOT 0.2 10/23/2022 1216      Component Value Date/Time   TSH 2.190 10/23/2022 1216   Nutritional Lab Results  Component Value Date   VD25OH 71.9 10/23/2022   VD25OH 63.1 11/17/2021   VD25OH 60.3 03/31/2021     Assessment and Plan    Prediabetes Prediabetes managed with metformin. Reports adherence to medication and dietary modifications. Lost two pounds since the last visit. Following the category one eating plan about 65% of the time and using a vibration plate balance machine for exercise. Discussed the importance of not skipping metformin doses and the impact of diet on blood glucose levels. - Refill metformin - Continue dietary modifications - Encourage formal exercise  Obesity Obesity with recent weight loss of two pounds. Making dietary changes and attempting to exercise. Experiencing difficulty with food intake due to dental issues. Discussed the importance of protein for dental healing and provided a recipe for creamy white chicken chili to increase protein intake. Advised on low-sugar fruits and avoidance of high-sugar fruits like mango. - Continue dietary modifications - Encourage formal exercise - Provide recipe for creamy white chicken chili to increase protein intake - Advise on low-sugar fruits and avoidance of high-sugar fruits like mango  Xeroderma Dry skin likely exacerbated by weather. Reports skin peeling and flakiness. Discussed the importance of applying lotion immediately after showering while skin is still damp to seal in moisture. - Advise applying lotion immediately after showering while skin is still damp - Recommend using Vaseline  or thick natural shea butter mixed with castor oil  General Health Maintenance Hydrating well with water and lemon. Discussed the potential high-calorie content of nuts and advised moderation. - Encourage continued hydration - Advise on the use of [place] yogurt for protein intake - Discuss the potential high-calorie content of nuts and advise moderation  Follow-up - Follow-up in January.        She was informed of the importance of frequent follow up visits to maximize her success with intensive lifestyle modifications for her multiple health conditions.    Quillian Quince, MD

## 2023-01-19 ENCOUNTER — Non-Acute Institutional Stay (HOSPITAL_COMMUNITY)
Admission: RE | Admit: 2023-01-19 | Discharge: 2023-01-19 | Disposition: A | Discharge status: Home / Self Care | Payer: Medicaid Other | Source: Ambulatory Visit | Attending: Internal Medicine | Admitting: Internal Medicine

## 2023-01-19 ENCOUNTER — Encounter (HOSPITAL_COMMUNITY): Payer: Medicaid Other

## 2023-01-19 DIAGNOSIS — G35 Multiple sclerosis: Secondary | ICD-10-CM | POA: Diagnosis present

## 2023-01-19 MED ORDER — SODIUM CHLORIDE 0.9 % IV SOLN
300.0000 mg | INTRAVENOUS | Status: DC
  Administered 2023-01-19: 300 mg via INTRAVENOUS
  Filled 2023-01-19: qty 15

## 2023-01-19 MED ORDER — SODIUM CHLORIDE 0.9 % IV SOLN: INTRAVENOUS | Status: DC | PRN

## 2023-01-19 NOTE — Progress Notes (Signed)
PATIENT CARE CENTER NOTE     Diagnosis: Multiple Sclerosis ( HCC) [G35]      Provider: Despina Arias, MD     Procedure: Donnamarie Poag 300mg  infusion     Note: Patient received Tysabri infusion (dose # 1 of 12)  via PIV. No premeds required per orders. Tolerated infusion well with no adverse reaction. Vital signs stable.  Patient observed for 1 hour post-infusion. Printed AVS offered but patient refused. Patient's next appointment scheduled for 02/16/23.  Patient alert, oriented and ambulatory at discharge.

## 2023-01-22 ENCOUNTER — Encounter (HOSPITAL_BASED_OUTPATIENT_CLINIC_OR_DEPARTMENT_OTHER): Payer: Self-pay | Admitting: Emergency Medicine

## 2023-01-22 ENCOUNTER — Emergency Department (HOSPITAL_BASED_OUTPATIENT_CLINIC_OR_DEPARTMENT_OTHER)
Admission: EM | Admit: 2023-01-22 | Discharge: 2023-01-22 | Disposition: A | Discharge status: Home / Self Care | Payer: Medicaid Other | Attending: Emergency Medicine | Admitting: Emergency Medicine

## 2023-01-22 ENCOUNTER — Other Ambulatory Visit: Payer: Self-pay

## 2023-01-22 DIAGNOSIS — X58XXXA Exposure to other specified factors, initial encounter: Secondary | ICD-10-CM | POA: Diagnosis not present

## 2023-01-22 DIAGNOSIS — T148XXA Other injury of unspecified body region, initial encounter: Secondary | ICD-10-CM

## 2023-01-22 DIAGNOSIS — S59912A Unspecified injury of left forearm, initial encounter: Secondary | ICD-10-CM | POA: Diagnosis present

## 2023-01-22 DIAGNOSIS — S50822A Blister (nonthermal) of left forearm, initial encounter: Secondary | ICD-10-CM | POA: Insufficient documentation

## 2023-01-22 MED ORDER — BACITRACIN ZINC 500 UNIT/GM EX OINT: 1.0000 | TOPICAL_OINTMENT | Freq: Two times a day (BID) | CUTANEOUS | 0 refills | Status: AC

## 2023-01-22 NOTE — ED Provider Notes (Signed)
  [facility] Emergency Department Provider Note MRN:  [id number]  Arrival date & time: 01/22/23     Chief Complaint   Blister   History of Present Illness   Tamara [name] is a 62 y.o. year-old female presents to the ED with chief complaint of bilster on left forearm.  She associates it with having the tape removed after recent IV for MS injection.  She denies pain.  Denies fever.  Denies redness.  Denies any treatment PTA.  History provided by patient.   Review of Systems  Pertinent positive and negative review of systems noted in HPI.    Physical Exam   Vitals:   01/22/23 1633  BP: 138/77  Pulse: 66  Resp: 19  Temp: 98.5 F (36.9 C)  SpO2: 100%    CONSTITUTIONAL:  well-appearing, NAD NEURO:  Alert and oriented x 3, CN 3-12 grossly intact EYES:  eyes equal and reactive ENT/NECK:  Supple, no stridor  CARDIO:  normal rate, appears well-perfused  PULM:  No respiratory distress, GI/GU:  non-distended,  MSK/SPINE:  No gross deformities, no edema, moves all extremities  SKIN:  no rash, atraumatic, very small vesicle on left posterior arm, no sign of infection or abscess   *Additional and/or pertinent findings included in MDM below  Diagnostic and Interventional Summary    EKG Interpretation Date/Time:    Ventricular Rate:    PR Interval:    QRS Duration:    QT Interval:    QTC Calculation:   R Axis:      Text Interpretation:         Labs Reviewed - No data to display  No orders to display    Medications - No data to display   Procedures  /  Critical Care Procedures  ED Course and Medical Decision Making  I have reviewed the triage vital signs, the nursing notes, and pertinent available records from the EMR.  Social Determinants Affecting Complexity of Care: Patient has no clinically significant social determinants affecting this chief complaint..   ED Course:    Medical Decision Making Risk OTC drugs.   Looks like a  small blister from either contact dermatitis or from the tape removal.  No other symptoms.  Treat with bacitracin.  Supportive care and watchful waiting.      Consultants: No consultations were needed in caring for this patient.   Treatment and Plan: Emergency department workup does not suggest an emergent condition requiring admission or immediate intervention beyond  what has been performed at this time. The patient is safe for discharge and has  been instructed to return immediately for worsening symptoms, change in  symptoms or any other concerns    Final Clinical Impressions(s) / ED Diagnoses     ICD-10-CM   1. Blister  T14.8XXA       ED Discharge Orders          Ordered    bacitracin ointment  2 times daily        01/22/23 1850              Discharge Instructions Discussed with and Provided to Patient:   Discharge Instructions   None      Roxy Horseman, PA-C 01/22/23 1906    Tegeler, Canary Brim, MD 01/22/23 2000

## 2023-01-22 NOTE — ED Triage Notes (Signed)
Blister on left arm notice yesterday  Patient thinks it is a reaction to the tape after having an IV on Friday.  Also reports tasting blood

## 2023-02-06 ENCOUNTER — Telehealth: Payer: Self-pay | Admitting: *Deleted

## 2023-02-06 NOTE — Telephone Encounter (Signed)
 Tamara [name]

## 2023-02-08 ENCOUNTER — Ambulatory Visit: Payer: Medicaid Other | Admitting: Neurology

## 2023-02-08 ENCOUNTER — Encounter: Payer: Self-pay | Admitting: Neurology

## 2023-02-13 ENCOUNTER — Encounter: Payer: Self-pay | Admitting: Neurology

## 2023-02-13 ENCOUNTER — Ambulatory Visit (INDEPENDENT_AMBULATORY_CARE_PROVIDER_SITE_OTHER): Payer: Medicaid Other | Admitting: Neurology

## 2023-02-13 VITALS — BP 115/72 | HR 71 | Ht 63.0 in | Wt 197.5 lb

## 2023-02-13 DIAGNOSIS — R200 Anesthesia of skin: Secondary | ICD-10-CM | POA: Diagnosis not present

## 2023-02-13 DIAGNOSIS — Z79899 Other long term (current) drug therapy: Secondary | ICD-10-CM | POA: Diagnosis not present

## 2023-02-13 DIAGNOSIS — R208 Other disturbances of skin sensation: Secondary | ICD-10-CM | POA: Diagnosis not present

## 2023-02-13 DIAGNOSIS — R269 Unspecified abnormalities of gait and mobility: Secondary | ICD-10-CM

## 2023-02-13 DIAGNOSIS — G35 Multiple sclerosis: Secondary | ICD-10-CM

## 2023-02-13 MED ORDER — PHENTERMINE HCL 37.5 MG PO CAPS: 37.5000 mg | ORAL_CAPSULE | Freq: Every morning | ORAL | 5 refills | Status: DC

## 2023-02-13 NOTE — Progress Notes (Signed)
 [facility]  PATIENT: Tamara [name] DOB: [birth year]  REFERRING DOCTOR OR PCP:  Brad Revering SOURCE: Patient, notes from emergency room and recent hospital stay ([facility]), imaging and lab reports, MRI images on PACS.  _________________________________   HISTORICAL  CHIEF COMPLAINT:  Chief Complaint  Patient presents with   Room 10    Pt is here Alone. Pt states that after her Tysabri  she had some blisters pop up that kept increasing in size. Pt states that she has cramping in her left toes lately.     HISTORY OF PRESENT ILLNESS:  Tamara [name] Is a 63 y.o. woman with relapsing remitting multiple sclerosis in May 2018 after she presented with transverse myelitis.   Update 02/13/2023 She had dental issues in November.  She reports she had a blod clot in hr mouth and still has soreness.      After her last Tysabri  infusion, she developed a blister cluster on her left forearm above where the IV was placed.   She felt that IV stung more.    She went to the ED who felt she had a contact dermatitis, possibly due to the IV tape.    Alexine reports that the IV staff usually uses alcohol on the arm as they remove the tape.   She notes it is improved though still present.   She was advised to put Bactrim over the blisters.     She is on Tysabri  and tolerates it well.    She is tolerating the infusions well  JCV Ab 12/01/2021 was 0.21 negative.  She has no exacerbation.   She has stable reduced gait and balance.Left > right arm numbness and tingling is still present but it is les painful than last year.    She could not tolerate gabapentin, Lyrica, imipramine , lamotrigine  and oxcarbazepine  and carbamazapine in the past Lacosamide  did  not help.    NCV/EMG was fine in the past so sensory symptoms likely from spinal cord plaques.     She feels spasms improves with omega 3 and Vit D.  She stopped tizanidine  since it made her sleepy  She is reporting migraine HA.  She is on topiramate   with Tylenol  for headaches with benefit.     She uses less Tylenol  since starting herbal teas (2 day Tyl now).         She is eating better -- leafy vegetables, fish and chicken but no red meat.   She is concerned about weight.  No weight loss with phentermine ..   She is on phentermine  for a stimulant for MS fatigue and topiramate  for migraine. We discussed trying to exercise more.    She has no more episodes of LOC or presyncope.   EEG was normal 07/2022.    She has fatigue.  She sleeps poorly at night.  She still takes phentermine  in the morning      She has had a vit D deficiency and takes D3 - dose recently reduced as Vit D was high normal  Her husband passed away 22-Mar-2021.      MS History:   On 06/10/16, she had the onset of numbness and clumsiness in her legs.   As the day went on, her hands also became numb and clumsy.  She felt numb from her waist down.    She went to the [facility] and had an xtay of her back and was referred to orthopedics     Two days later, she went  to the ED when her symptoms worsened and she was seen and discharged.    She saw her internist who ordered a lumbar MRI.  The lumbar MRI showed degenerative changes that were mild at L4-L5 and the study was otherwise fairly normal.   She was referred to the ED and had an MRI of the brain, cervical spine and thoracic spine.    She was found to have a transverse myelitis in the cervical spine with an enhancing lesion in the posterior columns. There was also a smaller non-enhancing focus at C5   She also had 5-6 spots in her brain, the largest being periventricular on the right.  She was admitted to [facility] and had 3 days of IV Solumedrol followed by an oral taper.    She was transferred to rehabilitation and was discharged 5/30.   She walked out and felt much better.   Additionally, while she was in the hospital (06/15/2016) she had a lumbar puncture. The CSF was abnormal showing showing the presence  4 oligoclonal  bands and an elevated IgG index of 0.9 (less than 0.7 is normal). The myelin basic protein was mildly elevated at 2.2.      She had another exacerbation after returning home with left leg dragging and feeling more off balance with stumbling.   In retrospect, in 2017, she had an episode lasting a few weeks where she felt she was dragging her left leg some. This completely resolved and she did not have any other symptoms such as numbness at that time.   MRIs of the brain, cervical spine, thoracic spine and lumbar spine were personally reviewed. The MRI of the cervical spine shows an enhancing lesion posterior spinal cord adjacent to C2-C3. Additionally there is a nonenhancing focus adjacent to C5. In the brain, there are several T2/FLAIR hyperintense foci and the largest is in the right parietal lobe and there are 4 or 5 other small foci, one in the periventricular white matter of the right frontal lobe and the rest in the subcortical white matter.    Laboratory studies show 4 oligoclonal bands and an elevated IgG index of 0.9. Myelin basic protein was mildly elevated at 2.2. She had elevated glucose between 120 and 140 several times while getting steroids.   Her Vit D was mildly low (25.6) and she just started OTC supplementation.   She started Tysabri  in July 2018  IMAGING  MRIs of the brain and cervical spine 07/16/2019 compared to the 2019 brain and 2018 cervical spine .  There are no new lesions. She has old cervical spine lesions at C3 and C5.  The brain shows multiple chronic foci in the hemispheres.  None in brainstem or cerebellum.  No focus enhances.    MRI cervical spie 09/24/2020 showed Stable chronic demyelinating plaques at C3 and C5 levels  MRI brain 07/12/2022 showed Several T2/FLAIR hyperintense foci in the cerebral hemispheres.  There overall pattern is nonspecific.  As she also has probable demyelinating plaque in the cervical spine, these could be consistent with demyelination.  They could also  be due to chronic microvascular ischemic change.  None of the foci appear to be acute.  They do not enhance.  Compared to the MRI from 2021, there are no symptoms.     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  It appears stable compared to older MRI      REVIEW OF SYSTEMS: Constitutional: No fevers,  chills, sweats, or change in appetite Eyes: No visual changes, double vision, eye pain Ear, nose and throat: No hearing loss, ear pain, nasal congestion, sore throat Cardiovascular: No chest pain, palpitations Respiratory:  No shortness of breath at rest or with exertion.   No wheezes GastrointestinaI: No nausea, vomiting, diarrhea, abdominal pain, fecal incontinence Genitourinary:  No dysuria, urinary retention or frequency.  No nocturia. Musculoskeletal:  No neck pain, back pain Integumentary: No rash, pruritus, skin lesions Neurological: as above Psychiatric: No depression at this time.  No anxiety Endocrine: No palpitations, diaphoresis, change in appetite, change in weigh or increased thirst Hematologic/Lymphatic:  No anemia, purpura, petechiae. Allergic/Immunologic: No itchy/runny eyes, nasal congestion, recent allergic reactions, rashes  ALLERGIES: Allergies  Allergen Reactions   Codeine  Other (See Comments)    Delusions   Dilaudid [Hydromorphone Hcl] Swelling and Other (See Comments)    Tongue swells    Ditropan  [Oxybutynin ] Other (See Comments)    Burning sensation   Other Swelling and Other (See Comments)    Unnamed gel or antiseptic solution- Was applied to IV site with a needle- Turned the skin black and blue that remained (caused burning and phlebitis, also)   Paxil [Paroxetine Hcl] Other (See Comments)    Hallucinations and heavy periods    Tegretol  [Carbamazepine ] Itching   Trileptal  [Oxcarbazepine ] Swelling   Adhesive [Tape] Hives, Itching, Rash and Other (See Comments)    PAPER TAPE   Augmentin  [Amoxicillin -Pot Clavulanate] Rash   Gabapentin Rash and Other (See Comments)    Hallucinations and depression, also    HOME MEDICATIONS:  Current Outpatient Medications:    acetaminophen  (TYLENOL ) 650 MG CR tablet, Take 1,300 mg by mouth 3 (three) times daily as needed for pain., Disp: , Rfl:    ascorbic acid (VITAMIN C) 500 MG tablet, Take 1,500 mg by mouth daily., Disp: , Rfl:    bacitracin  ointment, Apply 1 Application topically 2 (two) times daily., Disp: 120 g, Rfl: 0   Cholecalciferol  (VITAMIN D3) 50 MCG (2000 UT) TABS, Take 5 tablets by mouth daily., Disp: , Rfl:    Coenzyme Q10-Fish Oil-Vit E (CO-Q 10 OMEGA-3 FISH OIL PO), Take 1 capsule by mouth daily., Disp: , Rfl:    COLLAGEN PO, Take 3 tablets by mouth every other day. Pill; alternate with powder form., Disp: , Rfl:    Disposable Gloves (NITRILE GLOVES MEDIUM) MISC, Use prn for bowel incontinence, Disp: 100 each, Rfl: 5   Ferrous Sulfate (IRON PO), Take by mouth., Disp: , Rfl:    ibuprofen  (ADVIL ) 200 MG tablet, Take 200 mg by mouth every 6 (six) hours as needed., Disp: , Rfl:    Lactobacillus (ACIDOPHILUS/PECTIN PO), Take by mouth., Disp: , Rfl:    loratadine (CLARITIN) 10 MG tablet, Take 10 mg by mouth daily as needed for allergies or rhinitis., Disp: , Rfl:    magnesium citrate SOLN, Take 1 Bottle by mouth once., Disp: , Rfl:    metFORMIN  (GLUCOPHAGE ) 500 MG tablet, Take 1 tablet (500 mg total) by mouth daily with breakfast., Disp: 30 tablet, Rfl: 0   MINOXIDIL PO, Take by mouth., Disp: , Rfl:    natalizumab  (TYSABRI ) 300 MG/15ML injection, Inject 300 mg into the vein every 28 (twenty-eight) days., Disp: , Rfl:    pantoprazole  (PROTONIX ) 40 MG tablet, Take 1 tablet (40 mg total) by mouth daily., Disp: 90 tablet, Rfl: 0   POTASSIUM BICARBONATE PO, Take by mouth., Disp: , Rfl:    Thiamine HCl (VITAMIN B-1 PO), Take by  mouth., Disp: , Rfl:    topiramate  (TOPAMAX ) 50 MG tablet, Take 1 tablet (50 mg total) by mouth 3 (three)  times daily., Disp: 90 tablet, Rfl: 11   VITAMIN D  PO, Take 5,000 Units by mouth daily., Disp: , Rfl:    Zinc  50 MG TABS, Take by mouth., Disp: , Rfl:    ammonium lactate  (AMLACTIN) 12 % lotion, Apply 1 Application topically as needed for dry skin. (Patient not taking: Reported on 02/13/2023), Disp: 400 g, Rfl: 0   ciclopirox  (PENLAC ) 8 % solution, Apply topically at bedtime. Apply over nail and surrounding skin. Apply daily over previous coat. After seven (7) days, may remove with alcohol and continue cycle. (Patient not taking: Reported on 02/13/2023), Disp: 6.6 mL, Rfl: 2   COLLAGEN PO, Take 1 Dose by mouth every other day. Powder; alternate with pill form. (Patient not taking: Reported on 02/13/2023), Disp: , Rfl:    fluticasone  (FLONASE ) 50 MCG/ACT nasal spray, Place 2 sprays into both nostrils daily. (Patient not taking: Reported on 02/13/2023), Disp: 16 g, Rfl: 2   phentermine  37.5 MG capsule, Take 1 capsule (37.5 mg total) by mouth every morning., Disp: 30 capsule, Rfl: 5  Current Facility-Administered Medications:    natalizumab  (TYSABRI ) 300 mg in sodium chloride  0.9 % 100 mL IVPB, 300 mg, Intravenous, Q28 days, Demarie Uhlig, Charlie LABOR, MD  PAST MEDICAL HISTORY: Past Medical History:  Diagnosis Date   Allergy    Anemia    Bowel obstruction (HCC)    CKD (chronic kidney disease), stage II    based on labs   GERD (gastroesophageal reflux disease)    IBS (irritable bowel syndrome)    at age of 57   Multiple sclerosis (HCC)    Multiple sclerosis (HCC) 2018   Obesity    RBBB    Sciatica 2009   Vision abnormalities     PAST SURGICAL HISTORY: Past Surgical History:  Procedure Laterality Date   ABDOMINAL HYSTERECTOMY  01/30/2006   cervix and right ovary still intact   DENTAL SURGERY Right 12/2022   KNEE ARTHROSCOPY  2010 and 2011   Left knee, x2    FAMILY HISTORY: Family History  Problem Relation Age of Onset   Cancer Father        Gallbladder   Gallbladder disease Father     Hypertension Brother    Gallbladder disease Paternal Grandmother    Colon cancer Neg Hx    Colon polyps Neg Hx    Esophageal cancer Neg Hx    Kidney disease Neg Hx     SOCIAL HISTORY:  Social History   Socioeconomic History   Marital status: Widowed    Spouse name: Not on file   Number of children: 2   Years of education: Not on file   Highest education level: Not on file  Occupational History   Occupation: Science Writer  Tobacco Use   Smoking status: Never   Smokeless tobacco: Never  Vaping Use   Vaping status: Never Used  Substance and Sexual Activity   Alcohol use: No    Alcohol/week: 0.0 standard drinks of alcohol   Drug use: No   Sexual activity: Not on file  Other Topics Concern   Not on file  Social History Narrative   Not on file   Social Drivers of Health   Financial Resource Strain: Not on file  Food Insecurity: No Food Insecurity (03/21/2022)   Hunger Vital Sign    Worried About Running Out of Food in the  Last Year: Never true    Ran Out of Food in the Last Year: Never true  Transportation Needs: No Transportation Needs (01/16/2022)   PRAPARE - Administrator, Civil Service (Medical): No    Lack of Transportation (Non-Medical): No  Physical Activity: Insufficiently Active (01/16/2022)   Exercise Vital Sign    Days of Exercise per Week: 7 days    Minutes of Exercise per Session: 20 min  Stress: Not on file  Social Connections: Not on file  Intimate Partner Violence: Not on file     PHYSICAL EXAM  Vitals:   02/13/23 0922  BP: 115/72  Pulse: 71  Weight: 197 lb 8 oz (89.6 kg)  Height: 5' 3 (1.6 m)      Body mass index is 34.99 kg/m.   General: The patient is well-developed and well-nourished and in no acute distress   has 2+ edema in feet/ankles   Neurologic Exam  Mental status: The patient is alert and oriented x 3 at the time of the examination. The patient has apparent normal recent and remote memory, with an  apparently normal attention span and concentration ability.   Speech is normal.  Cranial nerves: Extraocular movements are full. Facial strength and sensation is normal. Trapezius strength is normal.. Hearing appears to be symmetric.  Motor:  Muscle bulk is normal.   Muscle tone is normal. Strength is 5/5 except 4+/5 hip flexion (iliopsoas).    RAM was symmetric in hands though  Sensory:   She had normal sensation to vibration in the arms and legs.  She reported some altered sensation in the left hand compared to the right.    Coordination: Cerebellar testing reveals good finger-nose-finger and mild reduced left heel-to-shin bilaterally.  Gait and station: Station is normal.  Gait is wide.  Tandem is poor.   She has a mild left foot drop the Romberg is negative.  Reflexes:  . Deep tendon reflexes are 3 and symmetric in the knees and ankles and 2 and symmetric in the arms.  No ankle clonus.     DIAGNOSTIC DATA (LABS, IMAGING, TESTING) - I reviewed patient records, labs, notes, testing and imaging myself where available.  Lab Results  Component Value Date   WBC 6.6 10/23/2022   HGB 11.9 10/23/2022   HCT 38.0 10/23/2022   MCV 91 10/23/2022   PLT 158 10/23/2022      Component Value Date/Time   NA 145 (H) 10/23/2022 1216   K 4.2 10/23/2022 1216   CL 110 (H) 10/23/2022 1216   CO2 23 10/23/2022 1216   GLUCOSE 87 10/23/2022 1216   GLUCOSE 94 09/07/2020 1405   BUN 16 10/23/2022 1216   CREATININE 0.89 10/23/2022 1216   CREATININE 1.01 07/08/2013 1440   CALCIUM 9.2 10/23/2022 1216   PROT 6.1 10/23/2022 1216   ALBUMIN 4.3 10/23/2022 1216   AST 13 10/23/2022 1216   ALT 12 10/23/2022 1216   ALKPHOS 75 10/23/2022 1216   BILITOT 0.2 10/23/2022 1216   GFRNONAA >60 09/07/2020 1405   GFRNONAA 64 07/08/2013 1440   GFRAA >60 07/10/2019 0519   GFRAA 74 07/08/2013 1440   Lab Results  Component Value Date   CHOL 202 (H) 10/23/2022   HDL 65 10/23/2022   LDLCALC 127 (H) 10/23/2022    TRIG 56 10/23/2022   CHOLHDL 3.0 03/31/2021   Lab Results  Component Value Date   HGBA1C 5.9 (H) 10/23/2022   Lab Results  Component Value Date   VITAMINB12 >2000 (  H) 10/23/2022   Lab Results  Component Value Date   TSH 2.190 10/23/2022       ASSESSMENT AND PLAN  Multiple sclerosis (HCC) - Plan: CBC with Differential/Platelet, Stratify JCV Antibody Test ([facility])  High risk medication use - Plan: CBC with Differential/Platelet, Stratify JCV Antibody Test ([facility])  Dysesthesia  Gait disturbance  Bilateral hand numbness  1.   For her MS, continue Tysabri ..  Her MS has been stable.   No exacerbations.   Recheck JCV Ab today and about every 6 months.  2.   She will continue phentermine  for MS fatigue and Topamax  for migraine.    Phentermine  to prevent weight gain and help MS fatigue 3.   Due to combination of physical and cognitive/fatigue issues, she is disabled and unable to work.  4.   Continue Vit D supplementation.    5.   Rtc 6 months  This visit is part of a comprehensive longitudinal care medical relationship regarding the patients primary diagnosis of MS and related concerns.   Riggins Cisek A. Vear, MD, PhD, DIEDRA  02/13/2023, 9:43 AM Certified in Neurology, Clinical Neurophysiology, Sleep Medicine, Pain Medicine and Neuroimaging Dir., [facility]  [facility] [address], [address] [place], [place] [address] [phone]

## 2023-02-13 NOTE — Progress Notes (Signed)
 JCV placed in [facility] box

## 2023-02-14 ENCOUNTER — Other Ambulatory Visit (INDEPENDENT_AMBULATORY_CARE_PROVIDER_SITE_OTHER): Payer: Self-pay | Admitting: Family Medicine

## 2023-02-14 ENCOUNTER — Ambulatory Visit (INDEPENDENT_AMBULATORY_CARE_PROVIDER_SITE_OTHER): Payer: Medicaid Other | Admitting: Family Medicine

## 2023-02-14 DIAGNOSIS — R7303 Prediabetes: Secondary | ICD-10-CM

## 2023-02-14 LAB — CBC WITH DIFFERENTIAL/PLATELET
Basophils Absolute: 0 10*3/uL (ref 0.0–0.2)
Basos: 0 %
EOS (ABSOLUTE): 0 10*3/uL (ref 0.0–0.4)
Eos: 0 %
Hematocrit: 35.6 % (ref 34.0–46.6)
Hemoglobin: 11.3 g/dL (ref 11.1–15.9)
Immature Grans (Abs): 0 10*3/uL (ref 0.0–0.1)
Immature Granulocytes: 0 %
Lymphocytes Absolute: 2.7 10*3/uL (ref 0.7–3.1)
Lymphs: 40 %
MCH: 28 pg (ref 26.6–33.0)
MCHC: 31.7 g/dL (ref 31.5–35.7)
MCV: 88 fL (ref 79–97)
Monocytes Absolute: 0.6 10*3/uL (ref 0.1–0.9)
Monocytes: 8 %
Neutrophils Absolute: 3.6 10*3/uL (ref 1.4–7.0)
Neutrophils: 52 %
Platelets: 178 10*3/uL (ref 150–450)
RBC: 4.03 x10E6/uL (ref 3.77–5.28)
RDW: 14.1 % (ref 11.7–15.4)
WBC: 6.9 10*3/uL (ref 3.4–10.8)

## 2023-02-15 ENCOUNTER — Ambulatory Visit (INDEPENDENT_AMBULATORY_CARE_PROVIDER_SITE_OTHER): Payer: Medicaid Other | Admitting: Internal Medicine

## 2023-02-15 ENCOUNTER — Encounter (INDEPENDENT_AMBULATORY_CARE_PROVIDER_SITE_OTHER): Payer: Self-pay | Admitting: Internal Medicine

## 2023-02-15 VITALS — BP 134/84 | HR 78 | Temp 98.2°F | Ht 63.5 in | Wt 186.0 lb

## 2023-02-15 DIAGNOSIS — R948 Abnormal results of function studies of other organs and systems: Secondary | ICD-10-CM | POA: Diagnosis not present

## 2023-02-15 DIAGNOSIS — E66811 Obesity, class 1: Secondary | ICD-10-CM | POA: Diagnosis not present

## 2023-02-15 DIAGNOSIS — Z6833 Body mass index (BMI) 33.0-33.9, adult: Secondary | ICD-10-CM

## 2023-02-15 DIAGNOSIS — G35 Multiple sclerosis: Secondary | ICD-10-CM

## 2023-02-15 DIAGNOSIS — R7303 Prediabetes: Secondary | ICD-10-CM

## 2023-02-15 MED ORDER — METFORMIN HCL 500 MG PO TABS: 500.0000 mg | ORAL_TABLET | Freq: Every day | ORAL | 0 refills | Status: DC

## 2023-02-15 NOTE — Progress Notes (Signed)
Office: [phone]  /  Fax: [phone]  Weight Summary And Biometrics  Vitals Temp: 98.2 F (36.8 C) BP: 134/84 Pulse Rate: 78 SpO2: 100 %   Anthropometric Measurements Height: 5' 3.5" (1.613 m) Weight: 186 lb (84.4 kg) BMI (Calculated): 32.43 Weight at Last Visit: 189 Weight Lost Since Last Visit: 3 lb Weight Gained Since Last Visit: 0 lb Starting Weight: 197 Total Weight Loss (lbs): 8 lb (3.629 kg) Peak Weight: 223 lb   Body Composition  Body Fat %: 42 % Fat Mass (lbs): 78.4 lbs Muscle Mass (lbs): 103 lbs Visceral Fat Rating : 12    No data recorded Today's Visit #: 5  Starting Date: 10/02/22   Subjective   Chief Complaint: Obesity  Tamara [name] is here to discuss her progress with her obesity treatment plan. She is on the the Category 1 Plan and states she is following her eating plan approximately 85 % of the time. She states she is exercising 15 minutes 3 times per week.  Interval History:   Discussed the use of AI scribe software for clinical note transcription with the patient, who gave verbal consent to proceed.  History of Present Illness   This is my first encounter with Tamara [name].She is a pleasant 63 year old female who is affected by obesity and has multiple sclerosis.  She has been on phentermine and topiramate prescribed by her neurologist for MS not for weight management. She reports a weight loss of three pounds over the holiday period, attributing this to a low appetite and a tendency to skip meals. She notes that she does not enjoy eating and often forgets to eat due to being busy. She has been trying to adhere to a meal plan, managing to consume two meals a day, but struggles to incorporate a third meal.  Tamara [name] is currently on appetite suppressants, topiramate and phentermine, prescribed by her neurologist for management of her Multiple Sclerosis (MS) symptoms, particularly fatigue. She also takes topiramate for headache management. She reports  that she has always had a low appetite, even before starting these medications.  She expresses a dislike for feeling full after meals and is considering using an exercise tool to help tighten her stomach muscles to alleviate this sensation. She also mentions that she does not snack or consume junk food such as cakes, ice cream, candy, and cookies.  In addition to her weight management concerns, Tamara [name] has been diagnosed with prediabetes and is currently on metformin. She acknowledges that she needs to increase her water intake. She also mentions that she has difficulty sleeping. Despite these challenges, Tamara [name] is committed to improving her health and is optimistic about making progress in her weight management journey.       Tamara [name]:  Denies problems with appetite and hunger signals.  Denies problems with satiety and satiation.  Denies problems with eating patterns and portion [name].  Denies abnormal cravings. Denies feeling deprived or restricted.  Patient has a restrictive eating pattern likely secondary to phentermine and topiramate.   Barriers identified: low volume of physical activity at present  and increase satiety and chronic skipping of meals due to anorexogenic agents .   Pharmacotherapy for weight loss: She is currently taking Metformin (off label use for incretin effect and / or insulin resistance and / or diabetes prevention) with adequate clinical response  and without side effects. and also on phentermine and topiramate prescribed by neurology for MS .   Assessment and Plan   Treatment Plan For Obesity:  Recommended Dietary Goals  Tamara [name] is currently in the action stage of change. As such, her goal is to continue weight management plan. She has agreed to: continue current plan  Behavioral Intervention  We discussed the following Behavioral Modification Strategies today: continue to work on maintaining a reduced calorie state, getting the recommended amount  of protein, incorporating whole foods, making healthy choices, staying well hydrated and practicing mindfulness when eating..  Additional resources provided today: None  Recommended Physical Activity Goals  Tamara [name] has been advised to work up to 150 minutes of moderate intensity aerobic activity a week and strengthening exercises 2-3 times per week for cardiovascular health, weight loss maintenance and preservation of muscle mass.   She has agreed to :  Think about enjoyable ways to increase daily physical activity and overcoming barriers to exercise and Increase physical activity in their day and reduce sedentary time (increase NEAT).  Pharmacotherapy  We discussed various medication options to help Tamara [name] with her weight loss efforts and we both agreed to : continue current anti-obesity medication regimen  Associated Conditions Addressed and Impacted by Obesity Treatment  Multiple sclerosis (HCC)  Prediabetes  Class 1 obesity due to excess calories with serious comorbidity and body mass index (BMI) of 33.0 to 33.9 in adult  Abnormal metabolism   Plan: Tamara [name] has complex obesity.  She is overweight but has a restrictive eating pattern slowed metabolic rate likely due to chronic calorie restriction and skipping of meals in the presence of anorexogenic medications currently being used for the treatment of her MS.  It is easy for her to skip meals and she feels full quickly.  She reports benefiting neurologically from these medications.  She therefore needs to be intentional about her nutritional intake.  We discussed trying to get at least 2 meals a day but also meals that her balance containing at least 30 to 40 g of protein and comprised of whole foods.  This will likely help improve her metabolic rate.  She will also continue to work on increasing volume of physical activity.  She has been prescribed metformin by her clinic for pharmacoprophylaxis and also weight management.  She seems to be  tolerating medication well I recommend that she continue current dose.    Objective   Physical Exam:  Blood pressure 134/84, pulse 78, temperature 98.2 F (36.8 C), height 5' 3.5" (1.613 m), weight 186 lb (84.4 kg), SpO2 100%. Body mass index is 32.43 kg/m.  General: She is overweight, cooperative, alert, well developed, and in no acute distress. PSYCH: Has normal mood, affect and thought process.   HEENT: EOMI, sclerae are anicteric. Lungs: Normal breathing effort, no conversational dyspnea. Extremities: No edema.  Neurologic: No gross sensory or motor deficits. No tremors or fasciculations noted.    Diagnostic Data Reviewed:  BMET    Component Value Date/Time   NA 145 (H) 10/23/2022 1216   K 4.2 10/23/2022 1216   CL 110 (H) 10/23/2022 1216   CO2 23 10/23/2022 1216   GLUCOSE 87 10/23/2022 1216   GLUCOSE 94 09/07/2020 1405   BUN 16 10/23/2022 1216   CREATININE 0.89 10/23/2022 1216   CREATININE 1.01 07/08/2013 1440   CALCIUM 9.2 10/23/2022 1216   GFRNONAA >60 09/07/2020 1405   GFRNONAA 64 07/08/2013 1440   GFRAA >60 07/10/2019 0519   GFRAA 74 07/08/2013 1440   Lab Results  Component Value Date   HGBA1C 5.9 (H) 10/23/2022   HGBA1C 5.9 (H) 03/31/2021   Lab Results  Component Value  Date   INSULIN 5.0 10/23/2022   Lab Results  Component Value Date   TSH 2.190 10/23/2022   CBC    Component Value Date/Time   WBC 6.9 02/13/2023 0954   WBC 7.0 09/07/2020 1405   RBC 4.03 02/13/2023 0954   RBC 4.00 09/07/2020 1405   HGB 11.3 02/13/2023 0954   HCT 35.6 02/13/2023 0954   PLT 178 02/13/2023 0954   MCV 88 02/13/2023 0954   MCH 28.0 02/13/2023 0954   MCH 28.5 09/07/2020 1405   MCHC 31.7 02/13/2023 0954   MCHC 31.6 09/07/2020 1405   RDW 14.1 02/13/2023 0954   Iron Studies    Component Value Date/Time   IRON 65 07/21/2013 1519   TIBC 290 07/21/2013 1519   FERRITIN 69 07/21/2013 1519   IRONPCTSAT 22 07/21/2013 1519   Lipid Panel     Component Value  Date/Time   CHOL 202 (H) 10/23/2022 1216   TRIG 56 10/23/2022 1216   HDL 65 10/23/2022 1216   CHOLHDL 3.0 03/31/2021 1058   LDLCALC 127 (H) 10/23/2022 1216   Hepatic Function Panel     Component Value Date/Time   PROT 6.1 10/23/2022 1216   ALBUMIN 4.3 10/23/2022 1216   AST 13 10/23/2022 1216   ALT 12 10/23/2022 1216   ALKPHOS 75 10/23/2022 1216   BILITOT 0.2 10/23/2022 1216      Component Value Date/Time   TSH 2.190 10/23/2022 1216   Nutritional Lab Results  Component Value Date   VD25OH 71.9 10/23/2022   VD25OH 63.1 11/17/2021   VD25OH 60.3 03/31/2021    Follow-Up   Return in about 3 weeks (around 03/08/2023) for Dr. Dalbert Garnet.Marland Kitchen She was informed of the importance of frequent follow up visits to maximize her success with intensive lifestyle modifications for her multiple health conditions.  Attestation Statement   Reviewed by clinician on day of visit: allergies, medications, problem list, medical history, surgical history, family history, social history, and previous encounter notes.     Worthy Rancher, MD

## 2023-02-16 ENCOUNTER — Inpatient Hospital Stay (HOSPITAL_COMMUNITY): Admission: RE | Admit: 2023-02-16 | Payer: Medicaid Other | Source: Ambulatory Visit

## 2023-02-19 ENCOUNTER — Non-Acute Institutional Stay (HOSPITAL_COMMUNITY)
Admission: RE | Admit: 2023-02-19 | Discharge: 2023-02-19 | Disposition: A | Discharge status: Home / Self Care | Payer: Medicaid Other | Source: Ambulatory Visit | Attending: Internal Medicine | Admitting: Internal Medicine

## 2023-02-19 ENCOUNTER — Encounter (HOSPITAL_COMMUNITY): Payer: Medicaid Other

## 2023-02-19 DIAGNOSIS — G35 Multiple sclerosis: Secondary | ICD-10-CM | POA: Insufficient documentation

## 2023-02-19 MED ORDER — SODIUM CHLORIDE 0.9 % IV SOLN
300.0000 mg | INTRAVENOUS | Status: DC
  Administered 2023-02-19: 300 mg via INTRAVENOUS
  Filled 2023-02-19: qty 15

## 2023-02-19 MED ORDER — SODIUM CHLORIDE 0.9 % IV SOLN: INTRAVENOUS | Status: DC | PRN

## 2023-02-19 NOTE — Progress Notes (Signed)
PATIENT CARE CENTER NOTE  Diagnosis: Multiple Sclerosis ( HCC) [G35]    Provider: Despina Arias MD  Procedure: Tysabri 300 mg infusion  Note: Patient received Tysabri infusion (dose #2 of 12) via PIV. No pre-medications per orders. Pt tolerated infusion with no adverse reaction. Vital signs are stable. AVS offered, but pt refused. Pt advised to schedule her next appointment at front desk. Pt is alert, oriented, and ambulatory at discharge.

## 2023-02-26 ENCOUNTER — Telehealth: Payer: Self-pay | Admitting: *Deleted

## 2023-02-26 NOTE — Telephone Encounter (Signed)
Tamara Kitchen

## 2023-03-14 ENCOUNTER — Encounter (INDEPENDENT_AMBULATORY_CARE_PROVIDER_SITE_OTHER): Payer: Self-pay

## 2023-03-14 ENCOUNTER — Ambulatory Visit (INDEPENDENT_AMBULATORY_CARE_PROVIDER_SITE_OTHER): Payer: Medicaid Other | Admitting: Family Medicine

## 2023-03-19 ENCOUNTER — Encounter (INDEPENDENT_AMBULATORY_CARE_PROVIDER_SITE_OTHER): Payer: Self-pay

## 2023-03-19 ENCOUNTER — Ambulatory Visit (HOSPITAL_COMMUNITY)
Admission: RE | Admit: 2023-03-19 | Discharge: 2023-03-19 | Disposition: A | Discharge status: Home / Self Care | Payer: Medicaid Other | Source: Ambulatory Visit | Attending: Internal Medicine | Admitting: Internal Medicine

## 2023-03-19 DIAGNOSIS — G35 Multiple sclerosis: Secondary | ICD-10-CM | POA: Diagnosis present

## 2023-03-19 MED ORDER — SODIUM CHLORIDE 0.9 % IV SOLN
300.0000 mg | INTRAVENOUS | Status: DC
  Administered 2023-03-19: 300 mg via INTRAVENOUS
  Filled 2023-03-19: qty 15

## 2023-03-19 MED ORDER — SODIUM CHLORIDE 0.9 % IV SOLN: INTRAVENOUS | Status: DC | PRN

## 2023-03-19 NOTE — Progress Notes (Signed)
PATIENT CARE CENTER NOTE     Diagnosis: Multiple Sclerosis ( HCC) [G35]      Provider: Despina Arias, MD     Procedure: Donnamarie Poag 300mg  infusion     Note: Patient received Tysabri infusion (dose # 3 of 12)  via PIV. No premeds required per orders. Tolerated infusion well with no adverse reaction. Vital signs stable.  Patient observed for 1 hour post-infusion. Printed AVS offered but patient refused. Infusion ordered for every 28 days and patient will scheduled next appointment at the front desk.  Patient alert, oriented and ambulatory at discharge.

## 2023-03-20 ENCOUNTER — Ambulatory Visit (INDEPENDENT_AMBULATORY_CARE_PROVIDER_SITE_OTHER): Payer: Medicaid Other | Admitting: Family Medicine

## 2023-03-20 ENCOUNTER — Ambulatory Visit (INDEPENDENT_AMBULATORY_CARE_PROVIDER_SITE_OTHER): Payer: Medicaid Other | Admitting: Physician Assistant

## 2023-03-20 ENCOUNTER — Other Ambulatory Visit: Payer: Self-pay

## 2023-03-20 MED ORDER — PANTOPRAZOLE SODIUM 40 MG PO TBEC
40.0000 mg | DELAYED_RELEASE_TABLET | Freq: Every day | ORAL | 0 refills | Status: DC
Start: 2023-03-20 — End: 2023-08-27

## 2023-03-20 NOTE — Telephone Encounter (Signed)
 Medication sent to pharmacy

## 2023-04-04 ENCOUNTER — Other Ambulatory Visit (INDEPENDENT_AMBULATORY_CARE_PROVIDER_SITE_OTHER): Payer: Self-pay | Admitting: Internal Medicine

## 2023-04-04 DIAGNOSIS — R7303 Prediabetes: Secondary | ICD-10-CM

## 2023-04-09 ENCOUNTER — Telehealth: Payer: Self-pay | Admitting: Neurology

## 2023-04-09 NOTE — Telephone Encounter (Signed)
 Received voicemail from patient over the weekend. Patient stated I need a referral from Dr. Epimenio Foot to a dermatologist-[facility] Dr. Langston Reusing at [address] P: [phone]. States this is very important and cannot go through her PCP, her PCP told her to call our office for this. Patient asking for a call back.

## 2023-04-18 ENCOUNTER — Non-Acute Institutional Stay (HOSPITAL_COMMUNITY)
Admission: RE | Admit: 2023-04-18 | Discharge: 2023-04-18 | Disposition: A | Discharge status: Home / Self Care | Payer: Medicaid Other | Source: Ambulatory Visit | Attending: Internal Medicine | Admitting: Internal Medicine

## 2023-04-18 DIAGNOSIS — G35 Multiple sclerosis: Secondary | ICD-10-CM | POA: Insufficient documentation

## 2023-04-18 MED ORDER — NATALIZUMAB 300 MG/15ML IV CONC
300.0000 mg | INTRAVENOUS | Status: DC
  Administered 2023-04-18: 300 mg via INTRAVENOUS
  Filled 2023-04-18: qty 15

## 2023-04-18 MED ORDER — SODIUM CHLORIDE 0.9 % IV SOLN: INTRAVENOUS | Status: DC | PRN

## 2023-04-18 NOTE — Progress Notes (Signed)
 PATIENT CARE CENTER NOTE  Diagnosis: Multiple Sclerosis [facility])   Provider: Despina Arias MD  Procedure: Tysabri 300 mg (dose #4 of 12)  Note: Patient received Tyabri infusion via PIV. No pre-medications per orders. Prior to infusion pt states that she has had no issues since last infusion, however she does get "boill"/"blister" on side of where Tysabri was infusing. Pt states that this has happened on her arm, hip, and leg. Always on the left side of her body. Pt states that this happens two days after she receives Tysabri infusion. RN contacted Dr. Ledon Snare office via Secure chat to make sure it is ok to proceed with infusion today.Marcelina Morel, RN says per provider it is ok to proceed with Tysabri infusion today, and to advise pt that if she experience similar symptoms after this infusion she should contact their office, and then they will place a dermatology referral. Pt advised and verbalized understanding. Pt tolerated the infusion with no adverse reaction. Pt declined to wait 1 hour post infusion observation, pt observed for 30 minutes post infusion. Pt advised she should scheduled her next appointment at the front desk. Pt is alert, oriented, and ambulatory at discharge.

## 2023-04-18 NOTE — Telephone Encounter (Signed)
 Received following message from [facility]: "Hello, this pt is here for her Tysabri infusion today. She said she has had no issues since last infusion. However, she said that she does get "boils"/ "blister" on the same side of her body that the IV was infusing Tysabri. She said it has happened on her arm, leg, and hip always on the left side. She said usually happens 2 days after receiving Tysabri infusion. She said she contacted your office for a referral to dermatology. Just wanted to make sure it is ok to proceed with Tysabri infusion today? "   Spoke w/ Dr. Epimenio Foot. He states to go ahead with infusion. if she develops similar sx after this one, to call us and we will then place referral to dermatology. I relayed this to infusion center.

## 2023-04-19 ENCOUNTER — Encounter (INDEPENDENT_AMBULATORY_CARE_PROVIDER_SITE_OTHER): Payer: Self-pay | Admitting: Family Medicine

## 2023-04-19 ENCOUNTER — Ambulatory Visit (INDEPENDENT_AMBULATORY_CARE_PROVIDER_SITE_OTHER): Payer: Medicaid Other | Admitting: Family Medicine

## 2023-04-19 VITALS — BP 110/66 | HR 81 | Temp 98.5°F | Ht 63.5 in | Wt 181.0 lb

## 2023-04-19 DIAGNOSIS — R7303 Prediabetes: Secondary | ICD-10-CM | POA: Diagnosis not present

## 2023-04-19 DIAGNOSIS — E559 Vitamin D deficiency, unspecified: Secondary | ICD-10-CM | POA: Diagnosis not present

## 2023-04-19 DIAGNOSIS — Z6831 Body mass index (BMI) 31.0-31.9, adult: Secondary | ICD-10-CM | POA: Diagnosis not present

## 2023-04-19 DIAGNOSIS — E669 Obesity, unspecified: Secondary | ICD-10-CM | POA: Diagnosis not present

## 2023-04-19 DIAGNOSIS — R238 Other skin changes: Secondary | ICD-10-CM | POA: Diagnosis not present

## 2023-04-19 MED ORDER — METFORMIN HCL 500 MG PO TABS: 500.0000 mg | ORAL_TABLET | Freq: Two times a day (BID) | ORAL | 0 refills | Status: DC

## 2023-04-19 NOTE — Progress Notes (Signed)
 Office: [phone]  /  Fax: [phone]  WEIGHT SUMMARY AND BIOMETRICS  Anthropometric Measurements Height: 5' 3.5" (1.613 m) Weight: 181 lb (82.1 kg) BMI (Calculated): 31.56 Weight at Last Visit: 186 lb Weight Lost Since Last Visit: 5 lb Weight Gained Since Last Visit: 0 Total Weight Loss (lbs): 13 lb (5.897 kg)   Body Composition  Body Fat %: 40.8 % Fat Mass (lbs): 74 lbs Muscle Mass (lbs): 102 lbs Visceral Fat Rating : 11   Other Clinical Data Fasting: yes Labs: no Today's Visit #: 6    Chief Complaint: OBESITY   History of Present Illness   SHAWNELLE SPOERL is a 63 year old female who presents to discuss her treatment plan and monitor her progress.  She has been following her category one plan consistently and has increased her exercise to about fifteen minutes three times per week. She has lost five pounds in the last two months since her last visit.  She is currently taking metformin but has difficulty with the timing of her doses due to disrupted sleep patterns. She takes the medication approximately seventy-five percent of the time in the morning, but sometimes takes it later in the day due to being awake at night. She occasionally skips doses to avoid taking two in one day. Her sleep pattern is significantly disrupted, with periods of being awake for up to forty-eight hours, particularly a week before her treatment. This affects her ability to remember to take her medication at consistent times.  She experiences cravings, initially for salty foods like chips, which lasted about two days, and more recently for sweet foods, including chocolate and steamed apples. She notes that she typically does not care for chocolate. These cravings have been challenging for her.  She has been engaging in physical activity using a machine called a 'squatter,' which she describes as a full-body exercise similar to a bike. She also uses a vibrating machine to alleviate muscle  spasms. She is active throughout the day, using a walker and tracking her steps with a watch. She mows her own grass using a manual push mower, which she enjoys and finds beneficial for exercise.  She is taking over-the-counter vitamin D at a dose of three 2000 IU pills per day. She has also started taking Coenzyme Q10, which she notes has caused her bowel movements to turn green.          PHYSICAL EXAM:  Blood pressure 110/66, pulse 81, temperature 98.5 F (36.9 C), height 5' 3.5" (1.613 m), weight 181 lb (82.1 kg), SpO2 100%. Body mass index is 31.56 kg/m.  DIAGNOSTIC DATA REVIEWED:  BMET    Component Value Date/Time   NA 145 (H) 10/23/2022 1216   K 4.2 10/23/2022 1216   CL 110 (H) 10/23/2022 1216   CO2 23 10/23/2022 1216   GLUCOSE 87 10/23/2022 1216   GLUCOSE 94 09/07/2020 1405   BUN 16 10/23/2022 1216   CREATININE 0.89 10/23/2022 1216   CREATININE 1.01 07/08/2013 1440   CALCIUM 9.2 10/23/2022 1216   GFRNONAA >60 09/07/2020 1405   GFRNONAA 64 07/08/2013 1440   GFRAA >60 07/10/2019 0519   GFRAA 74 07/08/2013 1440   Lab Results  Component Value Date   HGBA1C 5.9 (H) 10/23/2022   HGBA1C 5.9 (H) 03/31/2021   Lab Results  Component Value Date   INSULIN 5.0 10/23/2022   Lab Results  Component Value Date   TSH 2.190 10/23/2022   CBC    Component Value Date/Time  WBC 6.9 02/13/2023 0954   WBC 7.0 09/07/2020 1405   RBC 4.03 02/13/2023 0954   RBC 4.00 09/07/2020 1405   HGB 11.3 02/13/2023 0954   HCT 35.6 02/13/2023 0954   PLT 178 02/13/2023 0954   MCV 88 02/13/2023 0954   MCH 28.0 02/13/2023 0954   MCH 28.5 09/07/2020 1405   MCHC 31.7 02/13/2023 0954   MCHC 31.6 09/07/2020 1405   RDW 14.1 02/13/2023 0954   Iron Studies    Component Value Date/Time   IRON 65 07/21/2013 1519   TIBC 290 07/21/2013 1519   FERRITIN 69 07/21/2013 1519   IRONPCTSAT 22 07/21/2013 1519   Lipid Panel     Component Value Date/Time   CHOL 202 (H) 10/23/2022 1216   TRIG 56  10/23/2022 1216   HDL 65 10/23/2022 1216   CHOLHDL 3.0 03/31/2021 1058   LDLCALC 127 (H) 10/23/2022 1216   Hepatic Function Panel     Component Value Date/Time   PROT 6.1 10/23/2022 1216   ALBUMIN 4.3 10/23/2022 1216   AST 13 10/23/2022 1216   ALT 12 10/23/2022 1216   ALKPHOS 75 10/23/2022 1216   BILITOT 0.2 10/23/2022 1216      Component Value Date/Time   TSH 2.190 10/23/2022 1216   Nutritional Lab Results  Component Value Date   VD25OH 71.9 10/23/2022   VD25OH 63.1 11/17/2021   VD25OH 60.3 03/31/2021     Assessment and Plan    Prediabetes Increased carbohydrate cravings and disrupted sleep patterns are affecting her glycemic control. Current low-dose metformin may require adjustment to manage these symptoms effectively. - Increase metformin to twice daily with meals, ensuring doses are at least six hours apart to maintain efficacy. - Educate on taking metformin with food to reduce nausea. - Encourage consistent exercise and dietary habits to aid in weight management. - Discuss that slower weight loss is associated with better long-term outcomes and less resistance from the body.  Blistering condition post-treatment Blistering occurs on one side of her body following treatments. Managed with Manuka honey, but requires further evaluation by dermatology. - Refer to Dr. Langston Reusing at [facility] for evaluation of blistering condition.  Vitamin D deficiency She is focused on weight loss and overall health improvement, taking vitamin D and CoQ10 supplements, and engaging in regular physical activity. - Continue over-the-counter vitamin D at 6000 IU daily and plan to check labs soon       She was informed of the importance of frequent follow up visits to maximize her success with intensive lifestyle modifications for her multiple health conditions.    Quillian Quince, MD

## 2023-05-19 ENCOUNTER — Other Ambulatory Visit: Payer: Self-pay | Admitting: Neurology

## 2023-05-21 NOTE — Telephone Encounter (Signed)
 Last seen on 02/13/23 Follow up scheduled on 08/15/23

## 2023-05-23 ENCOUNTER — Ambulatory Visit (INDEPENDENT_AMBULATORY_CARE_PROVIDER_SITE_OTHER): Admitting: Family Medicine

## 2023-05-23 ENCOUNTER — Encounter (INDEPENDENT_AMBULATORY_CARE_PROVIDER_SITE_OTHER): Payer: Self-pay | Admitting: Family Medicine

## 2023-05-23 VITALS — BP 109/60 | HR 71 | Temp 98.0°F | Ht 63.5 in | Wt 181.0 lb

## 2023-05-23 DIAGNOSIS — Z6831 Body mass index (BMI) 31.0-31.9, adult: Secondary | ICD-10-CM

## 2023-05-23 DIAGNOSIS — G35 Multiple sclerosis: Secondary | ICD-10-CM | POA: Diagnosis not present

## 2023-05-23 DIAGNOSIS — E669 Obesity, unspecified: Secondary | ICD-10-CM

## 2023-05-23 DIAGNOSIS — R7303 Prediabetes: Secondary | ICD-10-CM

## 2023-05-23 DIAGNOSIS — R600 Localized edema: Secondary | ICD-10-CM | POA: Diagnosis not present

## 2023-05-23 MED ORDER — METFORMIN HCL 500 MG PO TABS: 500.0000 mg | ORAL_TABLET | Freq: Two times a day (BID) | ORAL | 0 refills | Status: DC

## 2023-05-23 NOTE — Progress Notes (Signed)
 Office: [phone]  /  Fax: [phone]  WEIGHT SUMMARY AND BIOMETRICS  Anthropometric Measurements Height: 5' 3.5" (1.613 m) Weight: 181 lb (82.1 kg) BMI (Calculated): 31.56 Weight at Last Visit: 181 lb Weight Lost Since Last Visit: 0 Weight Gained Since Last Visit: 0 Total Weight Loss (lbs): 13 lb (5.897 kg)   Body Composition  Body Fat %: 41.2 % Fat Mass (lbs): 74.8 lbs Muscle Mass (lbs): 101.2 lbs Visceral Fat Rating : 11   Other Clinical Data Fasting: no Labs: no Today's Visit #: 8 Starting Date: 10/02/22    Chief Complaint: OBESITY     History of Present Illness Tamara [name] is a 63 year old female who presents for obesity treatment and progress assessment.  She adheres to a category one eating plan 90% of the time and exercises for 15 minutes four times per week. She has maintained her weight over the last month and enjoys her current diet plan, which she finds affordable and fitting her schedule. She is exploring alternatives for eggs due to their high cost and is considering using liquid eggs or dehydrating them for long-term storage.  She has a history of prediabetes and is on metformin  500 mg twice a day. She has not been consistently taking the second dose of metformin  but has been consistent for the past week. She has started using a pill container to help remember her doses and requests a refill of metformin .  She experiences swelling, which she attributes to lunch meat chicken and cheese, and wears compression stockings to manage it. She reports fluid gain as a side effect of her MS treatment, Tysabri , and notes that she gains fluid regardless of her water intake.  She reports sleep disturbances, getting only about three hours of sleep for the first four days of the week and no sleep last night. She attributes this to her treatment schedule.  She is working on her physical activity by using a squat machine, which she recently assembled. She can  perform ten squats at a time and is focusing on consistency with her exercise routine. She notices changes in her clothing fit and weight loss in her face.      PHYSICAL EXAM:  Blood pressure 109/60, pulse 71, temperature 98 F (36.7 C), height 5' 3.5" (1.613 m), weight 181 lb (82.1 kg), SpO2 100%. Body mass index is 31.56 kg/m.  DIAGNOSTIC DATA REVIEWED:  BMET    Component Value Date/Time   NA 145 (H) 10/23/2022 1216   K 4.2 10/23/2022 1216   CL 110 (H) 10/23/2022 1216   CO2 23 10/23/2022 1216   GLUCOSE 87 10/23/2022 1216   GLUCOSE 94 09/07/2020 1405   BUN 16 10/23/2022 1216   CREATININE 0.89 10/23/2022 1216   CREATININE 1.01 07/08/2013 1440   CALCIUM 9.2 10/23/2022 1216   GFRNONAA >60 09/07/2020 1405   GFRNONAA 64 07/08/2013 1440   GFRAA >60 07/10/2019 0519   GFRAA 74 07/08/2013 1440   Lab Results  Component Value Date   HGBA1C 5.9 (H) 10/23/2022   HGBA1C 5.9 (H) 03/31/2021   Lab Results  Component Value Date   INSULIN  5.0 10/23/2022   Lab Results  Component Value Date   TSH 2.190 10/23/2022   CBC    Component Value Date/Time   WBC 6.9 02/13/2023 0954   WBC 7.0 09/07/2020 1405   RBC 4.03 02/13/2023 0954   RBC 4.00 09/07/2020 1405   HGB 11.3 02/13/2023 0954   HCT 35.6 02/13/2023 0954   PLT  178 02/13/2023 0954   MCV 88 02/13/2023 0954   MCH 28.0 02/13/2023 0954   MCH 28.5 09/07/2020 1405   MCHC 31.7 02/13/2023 0954   MCHC 31.6 09/07/2020 1405   RDW 14.1 02/13/2023 0954   Iron Studies    Component Value Date/Time   IRON 65 07/21/2013 1519   TIBC 290 07/21/2013 1519   FERRITIN 69 07/21/2013 1519   IRONPCTSAT 22 07/21/2013 1519   Lipid Panel     Component Value Date/Time   CHOL 202 (H) 10/23/2022 1216   TRIG 56 10/23/2022 1216   HDL 65 10/23/2022 1216   CHOLHDL 3.0 03/31/2021 1058   LDLCALC 127 (H) 10/23/2022 1216   Hepatic Function Panel     Component Value Date/Time   PROT 6.1 10/23/2022 1216   ALBUMIN 4.3 10/23/2022 1216   AST 13  10/23/2022 1216   ALT 12 10/23/2022 1216   ALKPHOS 75 10/23/2022 1216   BILITOT 0.2 10/23/2022 1216      Component Value Date/Time   TSH 2.190 10/23/2022 1216   Nutritional Lab Results  Component Value Date   VD25OH 71.9 10/23/2022   VD25OH 63.1 11/17/2021   VD25OH 60.3 03/31/2021     Assessment and Plan Assessment & Plan Multiple Sclerosis Multiple sclerosis is managed with Tysabri , with fluid retention as a side effect, particularly around infusion times. She is aware of fluid fluctuations and their impact on weight, using a scale to differentiate between fluid and fat changes. - Continue Tysabri  treatment as prescribed. - Monitor fluid retention and adjust lifestyle around infusion times.  Edema Edema is related to dietary intake, possibly exacerbated by lunch meat and cheese, and is also a side effect of MS treatment. She manages swelling with compression stockings. - Continue using compression stockings. - Avoid lunch meat and cheese if they exacerbate swelling.  Obesity Obesity management includes a category one eating plan followed 90% of the time and exercising 15 minutes four times per week. Weight has been maintained over the last month. She faces dietary challenges with eggs and chicken, exploring alternatives like liquid eggs and dehydrating eggs. She uses a squat machine for whole-body workouts and is advised to increase exercise frequency or duration as tolerated. - Continue category one eating plan. - Continue exercise regimen, aiming for increased frequency or duration as tolerated. - Consider using liquid eggs or dehydrating eggs as alternatives. - Encourage use of the squat machine for exercise.  Prediabetes Prediabetes is managed with metformin  500 mg twice a day. She has been inconsistent with the second dose but recently implemented a pill container system, improving adherence over the past week. - Refill metformin  500 mg twice a day. - Continue using a  pill container to improve medication adherence.      She was informed of the importance of frequent follow up visits to maximize her success with intensive lifestyle modifications for her multiple health conditions.    Jasmine Mesi, MD

## 2023-05-25 ENCOUNTER — Non-Acute Institutional Stay (HOSPITAL_COMMUNITY)
Admission: RE | Admit: 2023-05-25 | Discharge: 2023-05-25 | Disposition: A | Discharge status: Home / Self Care | Source: Ambulatory Visit | Attending: Internal Medicine | Admitting: Internal Medicine

## 2023-05-25 DIAGNOSIS — G35 Multiple sclerosis: Secondary | ICD-10-CM | POA: Insufficient documentation

## 2023-05-25 MED ORDER — SODIUM CHLORIDE 0.9 % IV SOLN: INTRAVENOUS | Status: DC | PRN

## 2023-05-25 MED ORDER — NATALIZUMAB 300 MG/15ML IV CONC
300.0000 mg | INTRAVENOUS | Status: DC
  Administered 2023-05-25: 300 mg via INTRAVENOUS
  Filled 2023-05-25: qty 15

## 2023-05-25 NOTE — Progress Notes (Signed)
 PATIENT CARE CENTER NOTE  Diagnosis:  Multiple Sclerosis [facility])    Provider: Hortensia Ma MD   Procedure: Tysabri  300 mg (dose #5 of 12)  Note: Patient received Tysabri  infusion via PIV. No pre-medications per orders. Pt tolerated infusion with no adverse reaction. AVS offered, but pt declined. Vital signs are stable. Pt advised that she should schedule her next appointment at the front desk. Pt is alert, oriented, and ambulatory at discharge.

## 2023-06-12 ENCOUNTER — Ambulatory Visit: Payer: Self-pay

## 2023-06-12 ENCOUNTER — Ambulatory Visit (INDEPENDENT_AMBULATORY_CARE_PROVIDER_SITE_OTHER): Admitting: Student

## 2023-06-12 DIAGNOSIS — R110 Nausea: Secondary | ICD-10-CM | POA: Diagnosis not present

## 2023-06-12 DIAGNOSIS — J069 Acute upper respiratory infection, unspecified: Secondary | ICD-10-CM | POA: Diagnosis present

## 2023-06-12 NOTE — Telephone Encounter (Signed)
 Copied from CRM [id number]. Topic: Clinical - Red Word Triage >> Jun 12, 2023 11:38 AM Tamara [name] wrote: Red Word that prompted transfer to Nurse Triage: Patient called to advise that she's had flu-like symptoms for the past 3 days. Symptoms such as: sore throat, disorientation, shortness of breath and fever. Patient grew concerned about symptoms when she experienced disorientation and hot and cold flashes. She also reports having congestion - leaky nasal. Lots of head congestion/pressure. She's coughing up green phlegm. Ears are popping.   Please assist.   Chief Complaint: Shortness of breath/Productive cough with green mucous Symptoms: Sore throat, hot & cold chills, congestion, dizziness, runny nose--clear, productive cough with green mucous Frequency: x 3 days Pertinent Negatives: Patient denies vomiting, diarrhea, chest pain Disposition: [] ED /[] Urgent Care (no appt availability in office) / [x] Appointment(In office/virtual)/ []  [place] [facility]/ [] Home Care/ [] Refused Recommended Disposition /[] [place] [facility]/ []  Follow-up with PCP Additional Notes: Patient called and advised that for the past 3 days she has been sick with flu like symptoms.  Patient states that she has a productive cough with green phlegm, a runny nose with clear mucous, dizziness, congestion, hot and cold chills, ars "popping" and a sore throat. Patient denies any vomiting, diarrhea, or chest pain. Appointment made for today 06/12/2023 at 2:45 pm with [facility]. Patient is also advised that if anything gets worse to go to the Emergency Room or call 911 for an ambulance to take her to the Emergency Room if needed. Patient verbalized understanding.  Reason for Disposition  [1] MILD difficulty breathing (e.g., minimal/no SOB at rest, SOB with walking, pulse <100) AND [2] NEW-onset or WORSE than normal  Answer Assessment - Initial Assessment Questions 1. RESPIRATORY STATUS: "Describe your  breathing?" (e.g., wheezing, shortness of breath, unable to speak, severe coughing)      Shortness of breath 2. ONSET: "When did this breathing problem begin?"      A little yesterday but got bad last night 3. PATTERN "Does the difficult breathing come and go, or has it been constant since it started?"      Worse with exertion 4. SEVERITY: "How bad is your breathing?" (e.g., mild, moderate, severe)    - MILD: No SOB at rest, mild SOB with walking, speaks normally in sentences, can lie down, no retractions, pulse < 100.    - MODERATE: SOB at rest, SOB with minimal exertion and prefers to sit, cannot lie down flat, speaks in phrases, mild retractions, audible wheezing, pulse 100-120.    - SEVERE: Very SOB at rest, speaks in single words, struggling to breathe, sitting hunched forward, retractions, pulse > 120      "I feel short of breath all the time" 5. RECURRENT SYMPTOM: "Have you had difficulty breathing before?" If Yes, ask: "When was the last time?" and "What happened that time?"      Years ago--had the flu and covid twice 6. CARDIAC HISTORY: "Do you have any history of heart disease?" (e.g., heart attack, angina, bypass surgery, angioplasty)      No 7. LUNG HISTORY: "Do you have any history of lung disease?"  (e.g., pulmonary embolus, asthma, emphysema)     No 8. CAUSE: "What do you think is causing the breathing problem?"      Unknown 9. OTHER SYMPTOMS: "Do you have any other symptoms? (e.g., dizziness, runny nose, cough, chest pain, fever)     Dizziness, runny nose--clear mucous, productive cough---green mucous, sore throat, ears stopped up 10. O2 SATURATION  MONITOR:  "Do you use an oxygen  saturation monitor (pulse oximeter) at home?" If Yes, ask: "What is your reading (oxygen  level) today?" "What is your usual oxygen  saturation reading?" (e.g., 95%)       N/A 12. TRAVEL: "Have you traveled out of the country in the last month?" (e.g., travel history, exposures)       No  Protocols  used: Breathing Difficulty-A-AH

## 2023-06-12 NOTE — Progress Notes (Signed)
  [facility] Internal Medicine Residency Telephone Encounter Continuity Care Appointment  HPI:  This telephone encounter was created for Ms. Masie Chiaramonte Hotz on 06/12/2023 for the following purpose/cc: URI  Third day of runny nose and sore throat. Coughing today and sneezing. Subjective fevers, gets chills that are intermittent. Weak and off balance. Ears are popping. Cannot cough up but has a green sputum. No hx of COPD or smoking. Nausea but no vomiting. Is having soup. Lightheaded with position changes and decreased PO intake. Sluggish feet reaction. Has history of MS and on natalizumab  - well controlled and followed with Neurology. Soreness over the cheekbone.  No one is sick around her. Has seasonal allergied. Tried mucinex  AM and PM, flonase , tylenol  650mg  tabs about 2-4 a day.     Past Medical History:  Past Medical History:  Diagnosis Date   Allergy    Anemia    Bowel obstruction (HCC)    CKD (chronic kidney disease), stage II    based on labs   GERD (gastroesophageal reflux disease)    IBS (irritable bowel syndrome)    at age of 61   Multiple sclerosis (HCC)    Multiple sclerosis (HCC) 2018   Obesity    RBBB    Sciatica 2009   Vision abnormalities      ROS:  Negative unless otherwise stated   Assessment / Plan / Recommendations:  URI (upper respiratory infection) Likely URI. Since it just started three days ago, she will do supportive care:   4000mg  of Tylenol  a day, 1000mg  max every 6 hours  Normal saline nasal spray, Netti pods, mucinex , and humidifier for congestion    Flonase  and Zyrtec  OTC  Cough drops, hot tea with honey and lemon She will get a thermometer to monitor for fevers and let us  know if she spikes a fever, if symptoms are not improving or not better in about 10 days. Her sluggishness could be related to her MS and she will call Neurology to follow up today.     Consent and Medical Decision Making:  Patient discussed with Dr. Lelia Putnam This is a  telephone encounter between Eliverto Gula and Jose Ngo ,MD on 06/12/2023 for evaluation of the above. The visit was conducted with the patient located at home and [place] ,MD at [facility]. The patient's identity was confirmed using their DOB and current address. The patient has consented to being evaluated through a telephone encounter and understands the associated risks (an examination cannot be done and the patient may need to come in for an appointment) / benefits (allows the patient to remain at home, decreasing exposure to coronavirus). I personally spent 15 minutes on medical discussion.

## 2023-06-12 NOTE — Assessment & Plan Note (Signed)
 Like URI. Since it just started three days ago, she will do supportive care:   4000mg  of Tylenol  a day, 1000mg  max every 6 hours  Normal saline nasal spray, Netti pods, mucinex , and humidifier for congestion    Flonase  and Zyrtec  OTC  Cough drops, hot tea with honey and lemon She will get a thermometer to monitor for fevers and let us  know if she spikes a fever, if symptoms are not improving or not better in about 10 days. Her sluggishness could be related to her MS and she will call Neurology to follow up today.

## 2023-06-13 NOTE — Progress Notes (Signed)
 Internal Medicine Clinic Attending  Case discussed with the resident at the time of the visit.  We reviewed the resident's history and exam and pertinent patient test results.  I agree with the assessment, diagnosis, and plan of care documented in the resident's note.

## 2023-06-27 ENCOUNTER — Ambulatory Visit (INDEPENDENT_AMBULATORY_CARE_PROVIDER_SITE_OTHER): Admitting: Family Medicine

## 2023-06-28 ENCOUNTER — Non-Acute Institutional Stay (HOSPITAL_COMMUNITY)
Admission: RE | Admit: 2023-06-28 | Discharge: 2023-06-28 | Disposition: A | Discharge status: Home / Self Care | Source: Ambulatory Visit | Attending: Internal Medicine | Admitting: Internal Medicine

## 2023-06-28 DIAGNOSIS — G35 Multiple sclerosis: Secondary | ICD-10-CM | POA: Diagnosis present

## 2023-06-28 MED ORDER — SODIUM CHLORIDE 0.9 % IV SOLN: INTRAVENOUS | Status: DC | PRN

## 2023-06-28 MED ORDER — SODIUM CHLORIDE 0.9 % IV SOLN
300.0000 mg | INTRAVENOUS | Status: DC
  Administered 2023-06-28: 300 mg via INTRAVENOUS
  Filled 2023-06-28: qty 15

## 2023-06-28 NOTE — Progress Notes (Signed)
 PATIENT CARE CENTER NOTE  Diagnosis: Multiple Sclerosis [facility])    Provider: Hortensia Ma MD  Procedure: Tysabri  300 mg (dose #6 of 12)  Note: Pt received Tysabri  infusion via PIV. No pre-medications per orders. Pt tolerated the infusion with no adverse reaction. Vital signs are stable. Pt declined to wait the 1 hour post infusion observation. Pts next infusion already scheduled for July 26, 2023 at 11 AM. Patient is alert, oriented, and ambulatory at discharge.

## 2023-07-03 ENCOUNTER — Encounter (INDEPENDENT_AMBULATORY_CARE_PROVIDER_SITE_OTHER): Payer: Self-pay

## 2023-07-03 ENCOUNTER — Encounter (INDEPENDENT_AMBULATORY_CARE_PROVIDER_SITE_OTHER): Payer: Self-pay | Admitting: Family Medicine

## 2023-07-03 ENCOUNTER — Ambulatory Visit (INDEPENDENT_AMBULATORY_CARE_PROVIDER_SITE_OTHER): Admitting: Family Medicine

## 2023-07-03 VITALS — BP 133/77 | HR 80 | Temp 98.2°F | Ht 63.5 in | Wt 181.0 lb

## 2023-07-03 DIAGNOSIS — F439 Reaction to severe stress, unspecified: Secondary | ICD-10-CM | POA: Diagnosis not present

## 2023-07-03 DIAGNOSIS — K5900 Constipation, unspecified: Secondary | ICD-10-CM | POA: Diagnosis not present

## 2023-07-03 DIAGNOSIS — E669 Obesity, unspecified: Secondary | ICD-10-CM

## 2023-07-03 DIAGNOSIS — Z6831 Body mass index (BMI) 31.0-31.9, adult: Secondary | ICD-10-CM

## 2023-07-03 DIAGNOSIS — Z638 Other specified problems related to primary support group: Secondary | ICD-10-CM

## 2023-07-03 DIAGNOSIS — R7303 Prediabetes: Secondary | ICD-10-CM

## 2023-07-03 DIAGNOSIS — K5901 Slow transit constipation: Secondary | ICD-10-CM

## 2023-07-03 DIAGNOSIS — Z6833 Body mass index (BMI) 33.0-33.9, adult: Secondary | ICD-10-CM

## 2023-07-03 MED ORDER — METFORMIN HCL 500 MG PO TABS: 500.0000 mg | ORAL_TABLET | Freq: Two times a day (BID) | ORAL | 0 refills | Status: DC

## 2023-07-03 NOTE — Progress Notes (Unsigned)
 Office: [phone]  /  Fax: [phone]  WEIGHT SUMMARY AND BIOMETRICS  Anthropometric Measurements Height: 5' 3.5" (1.613 m) Weight: 181 lb (82.1 kg) BMI (Calculated): 31.56 Weight at Last Visit: 181 lb Weight Lost Since Last Visit: 0 Weight Gained Since Last Visit: 0 Starting Weight: 194 lb Total Weight Loss (lbs): 13 lb (5.897 kg) Peak Weight: 223 lb   Body Composition  Body Fat %: 29.2 % Fat Mass (lbs): 52.8 lbs Muscle Mass (lbs): 121.8 lbs Total Body Water (lbs): 101.4 lbs Visceral Fat Rating : 8   Other Clinical Data Fasting: no Labs: no Today's Visit #: 9 Starting Date: 10/23/22    Chief Complaint: OBESITY    History of Present Illness Tamara [name] is a 63 year old female with obesity and prediabetes who presents for obesity treatment and progress assessment.  She is adhering to a category one eating plan about 80% of the time and engages in physical activity by walking for 15 to 30 minutes three times a week. Her weight has been stable over the last six weeks. She is working on improving her water intake and sometimes struggles with eating due to stress.  She has a history of prediabetes and is currently taking metformin  500 mg twice a day. She struggles with taking the second dose consistently but has implemented strategies such as using a medicine box and leaving the metformin  bottle out as reminders. Her last hemoglobin A1c, checked in September 2024, was elevated at 5.9. She is due for another lab check, especially for her prediabetes.  She experiences constipation, which she attributes to insufficient water intake. She has increased her vegetable intake and water consumption, which has helped alleviate the constipation. She drinks cold water and herbal tea with honey to manage this issue.  Her stress levels have been elevated due to a legal battle to regain custody of her granddaughter, who has been in the DSS system. She describes the situation as  complex and stressful, involving legal and familial challenges. Despite the stress, she does not resort to stress eating and sometimes has to remind herself to eat.      PHYSICAL EXAM:  Blood pressure 133/77, pulse 80, temperature 98.2 F (36.8 C), height 5' 3.5" (1.613 m), weight 181 lb (82.1 kg), SpO2 98%. Body mass index is 31.56 kg/m.  DIAGNOSTIC DATA REVIEWED:  BMET    Component Value Date/Time   NA 145 (H) 10/23/2022 1216   K 4.2 10/23/2022 1216   CL 110 (H) 10/23/2022 1216   CO2 23 10/23/2022 1216   GLUCOSE 87 10/23/2022 1216   GLUCOSE 94 09/07/2020 1405   BUN 16 10/23/2022 1216   CREATININE 0.89 10/23/2022 1216   CREATININE 1.01 07/08/2013 1440   CALCIUM 9.2 10/23/2022 1216   GFRNONAA >60 09/07/2020 1405   GFRNONAA 64 07/08/2013 1440   GFRAA >60 07/10/2019 0519   GFRAA 74 07/08/2013 1440   Lab Results  Component Value Date   HGBA1C 5.9 (H) 10/23/2022   HGBA1C 5.9 (H) 03/31/2021   Lab Results  Component Value Date   INSULIN  5.0 10/23/2022   Lab Results  Component Value Date   TSH 2.190 10/23/2022   CBC    Component Value Date/Time   WBC 6.9 02/13/2023 0954   WBC 7.0 09/07/2020 1405   RBC 4.03 02/13/2023 0954   RBC 4.00 09/07/2020 1405   HGB 11.3 02/13/2023 0954   HCT 35.6 02/13/2023 0954   PLT 178 02/13/2023 0954   MCV 88 02/13/2023 0954  MCH 28.0 02/13/2023 0954   MCH 28.5 09/07/2020 1405   MCHC 31.7 02/13/2023 0954   MCHC 31.6 09/07/2020 1405   RDW 14.1 02/13/2023 0954   Iron Studies    Component Value Date/Time   IRON 65 07/21/2013 1519   TIBC 290 07/21/2013 1519   FERRITIN 69 07/21/2013 1519   IRONPCTSAT 22 07/21/2013 1519   Lipid Panel     Component Value Date/Time   CHOL 202 (H) 10/23/2022 1216   TRIG 56 10/23/2022 1216   HDL 65 10/23/2022 1216   CHOLHDL 3.0 03/31/2021 1058   LDLCALC 127 (H) 10/23/2022 1216   Hepatic Function Panel     Component Value Date/Time   PROT 6.1 10/23/2022 1216   ALBUMIN 4.3 10/23/2022 1216    AST 13 10/23/2022 1216   ALT 12 10/23/2022 1216   ALKPHOS 75 10/23/2022 1216   BILITOT 0.2 10/23/2022 1216      Component Value Date/Time   TSH 2.190 10/23/2022 1216   Nutritional Lab Results  Component Value Date   VD25OH 71.9 10/23/2022   VD25OH 63.1 11/17/2021   VD25OH 60.3 03/31/2021     Assessment and Plan Assessment & Plan Obesity She is following a category one eating plan with 80% adherence and exercises by walking 15 to 30 minutes three times a week, along with yard work. Despite these efforts, her weight has remained stable over the last six weeks. She is motivated to lose weight to fit into clothes and has received positive feedback about weight loss in her face. - Continue category one eating plan - Continue walking 15 to 30 minutes three times a week - Encourage increased water intake - Follow up in 4 to 6 weeks  Prediabetes She is on metformin  500 mg twice daily. Her last hemoglobin A1c in September 2024 was 5.9%. She struggles with taking the second dose but uses a medicine box and keeps the metformin  bottle visible to improve adherence. Metformin  reduces her appetite, and she experiences constipation, which she manages by increasing water and vegetable intake. - Continue metformin  500 mg twice daily - Check hemoglobin A1c and other labs for prediabetes management - Monitor for constipation and adjust water and fiber intake as needed  Constipation She experiences constipation associated with metformin  use and insufficient water intake. Increased vegetable intake and water consumption have alleviated the constipation. - Increase water intake - Continue increased vegetable intake - Monitor bowel movements  Stress She reports elevated stress levels due to a legal battle to regain custody of her granddaughter. She is managing stress without resorting to comfort eating, which is beneficial for her weight management and prediabetes control. - Encourage stress  management techniques - Offer support and follow up on stress levels in future visits    She was informed of the importance of frequent follow up visits to maximize her success with intensive lifestyle modifications for her multiple health conditions.    Jasmine Mesi, MD

## 2023-07-15 ENCOUNTER — Other Ambulatory Visit: Payer: Self-pay | Admitting: Student

## 2023-07-15 DIAGNOSIS — J069 Acute upper respiratory infection, unspecified: Secondary | ICD-10-CM

## 2023-07-16 NOTE — Telephone Encounter (Signed)
 Medication sent to pharmacy

## 2023-07-26 ENCOUNTER — Non-Acute Institutional Stay (HOSPITAL_COMMUNITY): Attending: Internal Medicine

## 2023-07-30 ENCOUNTER — Telehealth (HOSPITAL_COMMUNITY): Payer: Self-pay | Admitting: *Deleted

## 2023-07-30 ENCOUNTER — Non-Acute Institutional Stay (HOSPITAL_COMMUNITY): Admission: RE | Admit: 2023-07-30 | Source: Ambulatory Visit

## 2023-07-30 NOTE — Telephone Encounter (Signed)
 Patient called requesting to cancel her Tysabri  appointment for today. Patient reports that she recently had dental work done and now her mouth is swollen, she is having increased pain and she feels like she has a fever. Reports that she is currently taking an antibiotic.  Patient plans to go to an emergency dentist for evaluation. Patient will call back to reschedule Tysabri  appointment.

## 2023-08-01 ENCOUNTER — Non-Acute Institutional Stay (HOSPITAL_COMMUNITY)
Admission: RE | Admit: 2023-08-01 | Discharge: 2023-08-01 | Disposition: A | Discharge status: Home / Self Care | Source: Ambulatory Visit | Attending: Internal Medicine | Admitting: Internal Medicine

## 2023-08-01 DIAGNOSIS — G35 Multiple sclerosis: Secondary | ICD-10-CM | POA: Insufficient documentation

## 2023-08-01 MED ORDER — SODIUM CHLORIDE 0.9 % IV SOLN
300.0000 mg | INTRAVENOUS | Status: DC
  Administered 2023-08-01: 300 mg via INTRAVENOUS
  Filled 2023-08-01: qty 15

## 2023-08-01 MED ORDER — SODIUM CHLORIDE 0.9 % IV SOLN: INTRAVENOUS | Status: DC | PRN

## 2023-08-01 NOTE — Progress Notes (Signed)
 PATIENT CARE CENTER NOTE  Diagnosis: Multiple Sclerosis [facility])    Provider:  Vear Ade MD   Procedure:  Tysabri  300 mg (dose #7 of 12)   Note: Patient received Tysabri  infusion via PIV.No pre-medications per orders. Pt tolerated the infusion with no adverse reaction. Vital signs are stable, Pt declined to wait the 1 hour post infusion observation. Pt advised that she should schedule her next infusion for 28 days, and that she can schedule at the front desk. Pt is alert, oriented, and ambulatory at discharge.

## 2023-08-06 ENCOUNTER — Encounter: Admitting: Internal Medicine

## 2023-08-09 ENCOUNTER — Encounter: Payer: Self-pay | Admitting: Internal Medicine

## 2023-08-14 NOTE — Progress Notes (Deleted)
 No chief complaint on file.   HISTORY OF PRESENT ILLNESS:  08/14/23 ALL:  Tamara [name] is a 63 y.o. female here today for follow up for     HISTORY (copied from Dr Duncan previous note)  Tamara [name] Is a 63 y.o. woman with relapsing remitting multiple sclerosis in May 2018 after she presented with transverse myelitis.    Update 02/13/2023 She had dental issues in November.  She reports she had a blod clot in hr mouth and still has soreness.       After her last Tysabri  infusion, she developed a blister cluster on her left forearm above where the IV was placed.   She felt that IV stung more.    She went to the ED who felt she had a contact dermatitis, possibly due to the IV tape.    Angles reports that the IV staff usually uses alcohol on the arm as they remove the tape.   She notes it is improved though still present.   She was advised to put Bactrim over the blisters.      She is on Tysabri  and tolerates it well.    She is tolerating the infusions well  JCV Ab 12/01/2021 was 0.21 negative.  She has no exacerbation.    She has stable reduced gait and balance.Left > right arm numbness and tingling is still present but it is les painful than last year.    She could not tolerate gabapentin, Lyrica, imipramine , lamotrigine  and oxcarbazepine  and carbamazapine in the past Lacosamide  did  not help.    NCV/EMG was fine in the past so sensory symptoms likely from spinal cord plaques.      She feels spasms improves with omega 3 and Vit D.  She stopped tizanidine  since it made her sleepy   She is reporting migraine HA.  She is on topiramate  with Tylenol  for headaches with benefit.     She uses less Tylenol  since starting herbal teas (2 day Tyl now).          She is eating better -- leafy vegetables, fish and chicken but no red meat.   She is concerned about weight.  No weight loss with phentermine ..   She is on phentermine  for a stimulant for MS fatigue and topiramate  for migraine. We discussed  trying to exercise more.     She has no more episodes of LOC or presyncope.   EEG was normal 07/2022.     She has fatigue.  She sleeps poorly at night.  She still takes phentermine  in the morning       She has had a vit D deficiency and takes D3 - dose recently reduced as Vit D was high normal   Her husband passed away 2021-04-09.        MS History:   On 06/10/16, she had the onset of numbness and clumsiness in her legs.   As the day went on, her hands also became numb and clumsy.  She felt numb from her waist down.    She went to the [facility] and had an xtay of her back and was referred to orthopedics     Two days later, she went to the ED when her symptoms worsened and she was seen and discharged.    She saw her internist who ordered a lumbar MRI.  The lumbar MRI showed degenerative changes that were mild at L4-L5 and the study was otherwise fairly normal.  She was referred to the ED and had an MRI of the brain, cervical spine and thoracic spine.    She was found to have a transverse myelitis in the cervical spine with an enhancing lesion in the posterior columns. There was also a smaller non-enhancing focus at C5   She also had 5-6 spots in her brain, the largest being periventricular on the right.  She was admitted to [facility] and had 3 days of IV Solumedrol followed by an oral taper.    She was transferred to rehabilitation and was discharged 5/30.   She walked out and felt much better.   Additionally, while she was in the hospital (06/15/2016) she had a lumbar puncture. The CSF was abnormal showing showing the presence  4 oligoclonal bands and an elevated IgG index of 0.9 (less than 0.7 is normal). The myelin basic protein was mildly elevated at 2.2.      She had another exacerbation after returning home with left leg dragging and feeling more off balance with stumbling.   In retrospect, in 2017, she had an episode lasting a few weeks where she felt she was dragging her left leg some.  This completely resolved and she did not have any other symptoms such as numbness at that time.   MRIs of the brain, cervical spine, thoracic spine and lumbar spine were personally reviewed. The MRI of the cervical spine shows an enhancing lesion posterior spinal cord adjacent to C2-C3. Additionally there is a nonenhancing focus adjacent to C5. In the brain, there are several T2/FLAIR hyperintense foci and the largest is in the right parietal lobe and there are 4 or 5 other small foci, one in the periventricular white matter of the right frontal lobe and the rest in the subcortical white matter.    Laboratory studies show 4 oligoclonal bands and an elevated IgG index of 0.9. Myelin basic protein was mildly elevated at 2.2. She had elevated glucose between 120 and 140 several times while getting steroids.   Her Vit D was mildly low (25.6) and she just started OTC supplementation.   She started Tysabri  in July 2018   IMAGING  MRIs of the brain and cervical spine 07/16/2019 compared to the 2019 brain and 2018 cervical spine .  There are no new lesions. She has old cervical spine lesions at C3 and C5.  The brain shows multiple chronic foci in the hemispheres.  None in brainstem or cerebellum.  No focus enhances.     MRI cervical spie 09/24/2020 showed Stable chronic demyelinating plaques at C3 and C5 levels   MRI brain 07/12/2022 showed Several T2/FLAIR hyperintense foci in the cerebral hemispheres.  There overall pattern is nonspecific.  As she also has probable demyelinating plaque in the cervical spine, these could be consistent with demyelination.  They could also be due to chronic microvascular ischemic change.  None of the foci appear to be acute.  They do not enhance.  Compared to the MRI from 2021, there are no symptoms.     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  It appears stable compared to older MRI    REVIEW OF SYSTEMS: Out of a complete  14 system review of symptoms, the patient complains only of the following symptoms, and all other reviewed systems are negative.   ALLERGIES: Allergies  Allergen Reactions   Codeine  Other (See Comments)    Delusions   Dilaudid [Hydromorphone Hcl] Swelling and  Other (See Comments)    Tongue swells    Ditropan  [Oxybutynin ] Other (See Comments)    Burning sensation   Other Swelling and Other (See Comments)    Unnamed gel or antiseptic solution- Was applied to IV site with a needle- Turned the skin black and blue that remained (caused burning and phlebitis, also)   Paxil [Paroxetine Hcl] Other (See Comments)    Hallucinations and heavy periods    Tegretol  [Carbamazepine ] Itching   Trileptal  [Oxcarbazepine ] Swelling   Adhesive [Tape] Hives, Itching, Rash and Other (See Comments)    PAPER TAPE   Augmentin [Amoxicillin-Pot Clavulanate] Rash   Gabapentin Rash and Other (See Comments)    Hallucinations and depression, also     HOME MEDICATIONS: Outpatient Medications Prior to Visit  Medication Sig Dispense Refill   acetaminophen  (TYLENOL ) 650 MG CR tablet Take 1,300 mg by mouth 3 (three) times daily as needed for pain.     ammonium lactate  (AMLACTIN) 12 % lotion Apply 1 Application topically as needed for dry skin. 400 g 0   ascorbic acid (VITAMIN C) 500 MG tablet Take 1,500 mg by mouth daily.     bacitracin  ointment Apply 1 Application topically 2 (two) times daily. 120 g 0   Cholecalciferol  (VITAMIN D3) 50 MCG (2000 UT) TABS Take 5 tablets by mouth daily.     ciclopirox  (PENLAC ) 8 % solution Apply topically at bedtime. Apply over nail and surrounding skin. Apply daily over previous coat. After seven (7) days, may remove with alcohol and continue cycle. 6.6 mL 2   Coenzyme Q10-Fish Oil-Vit E (CO-Q 10 OMEGA-3 FISH OIL PO) Take 1 capsule by mouth daily.     COLLAGEN PO Take 1 Dose by mouth every other day. Powder; alternate with pill form.     COLLAGEN PO Take 3 tablets by mouth every  other day. Pill; alternate with powder form.     Ferrous Sulfate (IRON PO) Take by mouth.     fluticasone  (FLONASE ) 50 MCG/ACT nasal spray Use 2 spray(s) in each nostril once daily 16 g 0   ibuprofen  (ADVIL ) 200 MG tablet Take 200 mg by mouth every 6 (six) hours as needed.     loratadine (CLARITIN) 10 MG tablet Take 10 mg by mouth daily as needed for allergies or rhinitis.     magnesium citrate SOLN Take 1 Bottle by mouth once.     metFORMIN  (GLUCOPHAGE ) 500 MG tablet Take 1 tablet (500 mg total) by mouth 2 (two) times daily with a meal. 60 tablet 0   natalizumab  (TYSABRI ) 300 MG/15ML injection Inject 300 mg into the vein every 28 (twenty-eight) days.     pantoprazole  (PROTONIX ) 40 MG tablet Take 1 tablet (40 mg total) by mouth daily. 90 tablet 0   phentermine  37.5 MG capsule Take 1 capsule (37.5 mg total) by mouth every morning. 30 capsule 5   POTASSIUM BICARBONATE PO Take by mouth.     Thiamine HCl (VITAMIN B-1 PO) Take by mouth.     topiramate  (TOPAMAX ) 50 MG tablet TAKE 1 TABLET BY MOUTH THREE TIMES DAILY 90 tablet 2   VITAMIN D  PO Take 5,000 Units by mouth daily.     Zinc  50 MG TABS Take by mouth.     Facility-Administered Medications Prior to Visit  Medication Dose Route Frequency Provider Last Rate Last Admin   natalizumab  (TYSABRI ) 300 mg in sodium chloride  0.9 % 100 mL IVPB  300 mg Intravenous Q28 days Sater, Charlie LABOR, MD  PAST MEDICAL HISTORY: Past Medical History:  Diagnosis Date   Allergy    Anemia    Bowel obstruction (HCC)    CKD (chronic kidney disease), stage II    based on labs   GERD (gastroesophageal reflux disease)    IBS (irritable bowel syndrome)    at age of 73   Multiple sclerosis (HCC)    Multiple sclerosis (HCC) 2018   Obesity    RBBB    Sciatica 2009   Vision abnormalities      PAST SURGICAL HISTORY: Past Surgical History:  Procedure Laterality Date   ABDOMINAL HYSTERECTOMY  01/30/2006   cervix and right ovary still intact   DENTAL  SURGERY Right 12/2022   KNEE ARTHROSCOPY  2010 and 2011   Left knee, x2     FAMILY HISTORY: Family History  Problem Relation Age of Onset   Cancer Father        Gallbladder   Gallbladder disease Father    Hypertension Brother    Gallbladder disease Paternal Grandmother    Colon cancer Neg Hx    Colon polyps Neg Hx    Esophageal cancer Neg Hx    Kidney disease Neg Hx      SOCIAL HISTORY: Social History   Socioeconomic History   Marital status: Widowed    Spouse name: Not on file   Number of children: 2   Years of education: Not on file   Highest education level: Not on file  Occupational History   Occupation: Science writer  Tobacco Use   Smoking status: Never   Smokeless tobacco: Never  Vaping Use   Vaping status: Never Used  Substance and Sexual Activity   Alcohol use: No    Alcohol/week: 0.0 standard drinks of alcohol   Drug use: No   Sexual activity: Not on file  Other Topics Concern   Not on file  Social History Narrative   Not on file   Social Drivers of Health   Financial Resource Strain: Not on file  Food Insecurity: No Food Insecurity (03/21/2022)   Hunger Vital Sign    Worried About Running Out of Food in the Last Year: Never true    Ran Out of Food in the Last Year: Never true  Transportation Needs: No Transportation Needs (01/16/2022)   PRAPARE - Administrator, Civil Service (Medical): No    Lack of Transportation (Non-Medical): No  Physical Activity: Insufficiently Active (01/16/2022)   Exercise Vital Sign    Days of Exercise per Week: 7 days    Minutes of Exercise per Session: 20 min  Stress: Not on file  Social Connections: Not on file  Intimate Partner Violence: Not on file     PHYSICAL EXAM  There were no vitals filed for this visit. There is no height or weight on file to calculate BMI.  Generalized: Well developed, in no acute distress  Cardiology: normal rate and rhythm, no murmur auscultated  Respiratory:  clear to auscultation bilaterally    Neurological examination  Mentation: Alert oriented to time, place, history taking. Follows all commands speech and language fluent Cranial nerve II-XII: Pupils were equal round reactive to light. Extraocular movements were full, visual field were full on confrontational test. Facial sensation and strength were normal. Uvula tongue midline. Head turning and shoulder shrug  were normal and symmetric. Motor: The motor testing reveals 5 over 5 strength of all 4 extremities. Good symmetric motor tone is noted throughout.  Sensory: Sensory testing is intact  to soft touch on all 4 extremities. No evidence of extinction is noted.  Coordination: Cerebellar testing reveals good finger-nose-finger and heel-to-shin bilaterally.  Gait and station: Gait is normal. Tandem gait is normal. Romberg is negative. No drift is seen.  Reflexes: Deep tendon reflexes are symmetric and normal bilaterally.    DIAGNOSTIC DATA (LABS, IMAGING, TESTING) - I reviewed patient records, labs, notes, testing and imaging myself where available.  Lab Results  Component Value Date   WBC 6.9 02/13/2023   HGB 11.3 02/13/2023   HCT 35.6 02/13/2023   MCV 88 02/13/2023   PLT 178 02/13/2023      Component Value Date/Time   NA 145 (H) 10/23/2022 1216   K 4.2 10/23/2022 1216   CL 110 (H) 10/23/2022 1216   CO2 23 10/23/2022 1216   GLUCOSE 87 10/23/2022 1216   GLUCOSE 94 09/07/2020 1405   BUN 16 10/23/2022 1216   CREATININE 0.89 10/23/2022 1216   CREATININE 1.01 07/08/2013 1440   CALCIUM 9.2 10/23/2022 1216   PROT 6.1 10/23/2022 1216   ALBUMIN 4.3 10/23/2022 1216   AST 13 10/23/2022 1216   ALT 12 10/23/2022 1216   ALKPHOS 75 10/23/2022 1216   BILITOT 0.2 10/23/2022 1216   GFRNONAA >60 09/07/2020 1405   GFRNONAA 64 07/08/2013 1440   GFRAA >60 07/10/2019 0519   GFRAA 74 07/08/2013 1440   Lab Results  Component Value Date   CHOL 202 (H) 10/23/2022   HDL 65 10/23/2022   LDLCALC 127  (H) 10/23/2022   TRIG 56 10/23/2022   CHOLHDL 3.0 03/31/2021   Lab Results  Component Value Date   HGBA1C 5.9 (H) 10/23/2022   Lab Results  Component Value Date   VITAMINB12 >2000 (H) 10/23/2022   Lab Results  Component Value Date   TSH 2.190 10/23/2022        No data to display               No data to display           ASSESSMENT AND PLAN  63 y.o. year old female  has a past medical history of Allergy, Anemia, Bowel obstruction (HCC), CKD (chronic kidney disease), stage II, GERD (gastroesophageal reflux disease), IBS (irritable bowel syndrome), Multiple sclerosis (HCC), Multiple sclerosis (HCC) (2018), Obesity, RBBB, Sciatica (2009), and Vision abnormalities. here with    No diagnosis found.  Grayce ORN Lackie ***.  Healthy lifestyle habits encouraged. *** will follow up with PCP as directed. *** will return to see me in ***, sooner if needed. *** verbalizes understanding and agreement with this plan.   No orders of the defined types were placed in this encounter.    No orders of the defined types were placed in this encounter.    Greig Forbes, MSN, FNP-C 08/14/2023, 12:52 PM  [facility] [address], [address] [place], [place] [address] [phone]

## 2023-08-14 NOTE — Patient Instructions (Incomplete)

## 2023-08-15 ENCOUNTER — Ambulatory Visit: Payer: Medicaid Other | Admitting: Family Medicine

## 2023-08-15 ENCOUNTER — Encounter: Payer: Self-pay | Admitting: Family Medicine

## 2023-08-15 DIAGNOSIS — G35 Multiple sclerosis: Secondary | ICD-10-CM

## 2023-08-15 DIAGNOSIS — Z79899 Other long term (current) drug therapy: Secondary | ICD-10-CM

## 2023-08-15 DIAGNOSIS — R269 Unspecified abnormalities of gait and mobility: Secondary | ICD-10-CM

## 2023-08-15 DIAGNOSIS — R208 Other disturbances of skin sensation: Secondary | ICD-10-CM

## 2023-08-15 DIAGNOSIS — E559 Vitamin D deficiency, unspecified: Secondary | ICD-10-CM

## 2023-08-16 ENCOUNTER — Telehealth: Payer: Self-pay | Admitting: Neurology

## 2023-08-16 ENCOUNTER — Telehealth: Payer: Self-pay | Admitting: Family Medicine

## 2023-08-16 NOTE — Telephone Encounter (Signed)
 Called and spoke w/ pt. Scheduled appt for 08/20/23 at 3:00p, check in 2:30p. I confirmed she does not need any refills. She will speak with Dr. Vear about this at appt.

## 2023-08-16 NOTE — Telephone Encounter (Signed)
 Patient asking for a call back to confirm if able to get refill before appointment on 01/10/24.

## 2023-08-16 NOTE — Telephone Encounter (Signed)
 Pt made request for an appointment, pt connected to billing re: bad debt bal

## 2023-08-16 NOTE — Telephone Encounter (Addendum)
 Spoke w/ Dr. Vear. She is scheduled for next Tysabri  09/10/23 at [facility]. Last JCV 01/2023. She will need appt in next month with him rather than 01/10/24 and he can refill meds until next appt.  Last phentermine  rx sent in 02/13/23 #30, 5 refills. She last refilled 07/15/23 #30.  Last topiramate  rx sent 05/21/23 #90, 2 refills to [facility] [address] [place] - [address]. Refills should be on file.  I called pt at [phone]. Mailbox full, unable to LVM

## 2023-08-16 NOTE — Telephone Encounter (Signed)
 [facility]RECTION Appointment was Wednesday 08/15/2023

## 2023-08-16 NOTE — Telephone Encounter (Signed)
 Patient had a 3pm appointment with Amy Lomax 7/17. This patient has a history of being late to her appointments and Dr. Vear usually will see her. Since the patient was seeing Amy, I spoke with Amy asking if she would still see this patient if she showed past her appointment time. Amy agreed to see her at 3:10 which is past our threshold of a patient being late. The patient did not show and at 3:11 we marked her as a no show. We also called the patient 5 minutes prior to the 3pm appointment time to ensure she was still coming and her voicemail was full.  Also in advance we did a reminder call, sent a text ,message and mychart message reminding the patient to check in at 2:30/2:45 because we don't see patients late. We tried to be procative to ensure she was able to be seen. The patient did not read the [employer].  Manager Duwaine Eriksson was also up front at the time and we mentioned to her that the patient still has not showed.

## 2023-08-16 NOTE — Telephone Encounter (Signed)
 Pt called back to request  Pt  complaint number informed that there was not a number and I will deliver the message to  our director the patient  was very upset . Informed pt that she will get a call back  to discuss  concern.s

## 2023-08-20 ENCOUNTER — Other Ambulatory Visit: Payer: Self-pay

## 2023-08-20 ENCOUNTER — Ambulatory Visit (INDEPENDENT_AMBULATORY_CARE_PROVIDER_SITE_OTHER): Admitting: Neurology

## 2023-08-20 VITALS — BP 120/70 | HR 60 | Ht 63.5 in | Wt 177.2 lb

## 2023-08-20 DIAGNOSIS — G35 Multiple sclerosis: Secondary | ICD-10-CM

## 2023-08-20 DIAGNOSIS — R200 Anesthesia of skin: Secondary | ICD-10-CM | POA: Diagnosis not present

## 2023-08-20 DIAGNOSIS — R269 Unspecified abnormalities of gait and mobility: Secondary | ICD-10-CM

## 2023-08-20 DIAGNOSIS — Z79899 Other long term (current) drug therapy: Secondary | ICD-10-CM

## 2023-08-20 DIAGNOSIS — R208 Other disturbances of skin sensation: Secondary | ICD-10-CM | POA: Diagnosis not present

## 2023-08-20 MED ORDER — PHENTERMINE HCL 37.5 MG PO CAPS: 37.5000 mg | ORAL_CAPSULE | Freq: Every morning | ORAL | 5 refills | Status: DC

## 2023-08-20 MED ORDER — TOPIRAMATE 50 MG PO TABS: 50.0000 mg | ORAL_TABLET | Freq: Every day | ORAL | 3 refills | Status: AC

## 2023-08-20 NOTE — [place] Notes (Signed)
 [facility]  PATIENT: Tamara [name] DOB: [birth year]  REFERRING DOCTOR OR PCP:  Brad Revering SOURCE: Patient, notes from emergency room and recent hospital stay ([facility]), imaging and lab reports, MRI images on PACS.  _________________________________   HISTORICAL  CHIEF COMPLAINT:  Chief Complaint  Patient presents with   Follow-up    RM 10, alone. Having more issues with walking. Tolerating Tysabri  infusion well at [facility]. Balance worse. Does not use cane/walker.  Wears glasses. Last eye appt: years ago. Recommended she should schedule yearly eye appt.    HISTORY OF PRESENT ILLNESS:  Tamara [name] Is a 63 y.o. woman with relapsing remitting multiple sclerosis in May 2018 after she presented with transverse myelitis.   Update 08/20/2023 She had her last Tysabri  infusion 3-4 weeks ago.   Last JCV in 01/2023 was negative at 0.27.   She tolerates it well.   She has no exacerbation.   She has stable reduced gait and balance.   She notes mild left leg weakness.  No recent falls.   Also has left > right arm numbness and tingling is still present but it is les painful than last year.  THis feels like electricity.    She could not tolerate gabapentin, Lyrica, imipramine , lamotrigine  and oxcarbazepine  and carbamazapine in the past Lacosamide  did  not help.    NCV/EMG was fine in the past so sensory symptoms likely from spinal cord plaques.  Wearing gloves helps her some.   She feels she does worse when she eats red meat or a lot of sugar.     She is eating better -- leafy vegetables, fish and chicken but no red meat.    No weight loss with phentermine  last year but it helped fatigue.SABRASABRAShe  lost 20 pounds (197 to 177) this year.   Metformin  may have helped some.   She feels spasms improves with omega 3 and Vit D.  She stopped tizanidine  since it made her sleepy  She is reporting only a couple migraine HA/month.  She is on topiramate  with Tylenol  for headaches with benefit.     She has no more episodes of LOC or presyncope.   EEG was normal 07/2022.    She has fatigue.  She sleeps poorly at night.  She still takes phentermine  in the morning which has helped  She has had a vit D deficiency and takes D3 - dose recently reduced as Vit D was high normal  She feels mood is doing well.  Her husband passed away 03-Apr-2021 and mood was worse for a while.   MS History:   On 06/10/16, she had the onset of numbness and clumsiness in her legs.   As the day went on, her hands also became numb and clumsy.  She felt numb from her waist down.    She went to the [facility] and had an xtay of her back and was referred to orthopedics     Two days later, she went to the ED when her symptoms worsened and she was seen and discharged.    She saw her internist who ordered a lumbar MRI.  The lumbar MRI showed degenerative changes that were mild at L4-L5 and the study was otherwise fairly normal.   She was referred to the ED and had an MRI of the brain, cervical spine and thoracic spine.    She was found to have a transverse myelitis in the cervical spine with an enhancing lesion in  the posterior columns. There was also a smaller non-enhancing focus at C5   She also had 5-6 spots in her brain, the largest being periventricular on the right.  She was admitted to [facility] and had 3 days of IV Solumedrol followed by an oral taper.    She was transferred to rehabilitation and was discharged 5/30.   She walked out and felt much better.   Additionally, while she was in the hospital (06/15/2016) she had a lumbar puncture. The CSF was abnormal showing showing the presence  4 oligoclonal bands and an elevated IgG index of 0.9 (less than 0.7 is normal). The myelin basic protein was mildly elevated at 2.2.      She had another exacerbation after returning home with left leg dragging and feeling more off balance with stumbling.   In retrospect, in 2017, she had an episode lasting a few weeks where she felt  she was dragging her left leg some. This completely resolved and she did not have any other symptoms such as numbness at that time.   MRIs of the brain, cervical spine, thoracic spine and lumbar spine were personally reviewed. The MRI of the cervical spine shows an enhancing lesion posterior spinal cord adjacent to C2-C3. Additionally there is a nonenhancing focus adjacent to C5. In the brain, there are several T2/FLAIR hyperintense foci and the largest is in the right parietal lobe and there are 4 or 5 other small foci, one in the periventricular white matter of the right frontal lobe and the rest in the subcortical white matter.    Laboratory studies show 4 oligoclonal bands and an elevated IgG index of 0.9. Myelin basic protein was mildly elevated at 2.2. She had elevated glucose between 120 and 140 several times while getting steroids.   Her Vit D was mildly low (25.6) and she just started OTC supplementation.   She started Tysabri  in July 2018  IMAGING  MRIs of the brain and cervical spine 07/16/2019 compared to the 2019 brain and 2018 cervical spine .  There are no new lesions. She has old cervical spine lesions at C3 and C5.  The brain shows multiple chronic foci in the hemispheres.  None in brainstem or cerebellum.  No focus enhances.    MRI cervical spie 09/24/2020 showed Stable chronic demyelinating plaques at C3 and C5 levels  MRI brain 07/12/2022 showed Several T2/FLAIR hyperintense foci in the cerebral hemispheres.  There overall pattern is nonspecific.  As she also has probable demyelinating plaque in the cervical spine, these could be consistent with demyelination.  They could also be due to chronic microvascular ischemic change.  None of the foci appear to be acute.  They do not enhance.  Compared to the MRI from 2021, there are no symptoms.     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  It appears stable compared to older MRI      REVIEW OF SYSTEMS: Constitutional: No fevers, chills, sweats, or change in appetite Eyes: No visual changes, double vision, eye pain Ear, nose and throat: No hearing loss, ear pain, nasal congestion, sore throat Cardiovascular: No chest pain, palpitations Respiratory:  No shortness of breath at rest or with exertion.   No wheezes GastrointestinaI: No nausea, vomiting, diarrhea, abdominal pain, fecal incontinence Genitourinary:  No dysuria, urinary retention or frequency.  No nocturia. Musculoskeletal:  No neck pain, back pain Integumentary: No rash, pruritus, skin lesions Neurological: as above Psychiatric: No  depression at this time.  No anxiety Endocrine: No palpitations, diaphoresis, change in appetite, change in weigh or increased thirst Hematologic/Lymphatic:  No anemia, purpura, petechiae. Allergic/Immunologic: No itchy/runny eyes, nasal congestion, recent allergic reactions, rashes  ALLERGIES: Allergies  Allergen Reactions   Codeine  Other (See Comments)    Delusions   Dilaudid [Hydromorphone Hcl] Swelling and Other (See Comments)    Tongue swells    Ditropan  [Oxybutynin ] Other (See Comments)    Burning sensation   Other Swelling and Other (See Comments)    Unnamed gel or antiseptic solution- Was applied to IV site with a needle- Turned the skin black and blue that remained (caused burning and phlebitis, also)   Paxil [Paroxetine Hcl] Other (See Comments)    Hallucinations and heavy periods    Tegretol  [Carbamazepine ] Itching   Trileptal  [Oxcarbazepine ] Swelling   Adhesive [Tape] Hives, Itching, Rash and Other (See Comments)    PAPER TAPE   Augmentin [Amoxicillin-Pot Clavulanate] Rash   Gabapentin Rash and Other (See Comments)    Hallucinations and depression, also    HOME MEDICATIONS:  Current Outpatient Medications:    acetaminophen  (TYLENOL ) 650 MG CR tablet, Take 1,300 mg by mouth 3 (three) times daily as needed for pain., Disp: , Rfl:    ascorbic acid  (VITAMIN C) 500 MG tablet, Take 1,500 mg by mouth daily., Disp: , Rfl:    bacitracin  ointment, Apply 1 Application topically 2 (two) times daily., Disp: 120 g, Rfl: 0   Cholecalciferol  (VITAMIN D3) 50 MCG (2000 UT) TABS, Take 5 tablets by mouth daily., Disp: , Rfl:    ciclopirox  (PENLAC ) 8 % solution, Apply topically at bedtime. Apply over nail and surrounding skin. Apply daily over previous coat. After seven (7) days, may remove with alcohol and continue cycle., Disp: 6.6 mL, Rfl: 2   Coenzyme Q10-Fish Oil-Vit E (CO-Q 10 OMEGA-3 FISH OIL PO), Take 1 capsule by mouth daily., Disp: , Rfl:    COLLAGEN PO, Take 1 Dose by mouth every other day. Powder; alternate with pill form., Disp: , Rfl:    COLLAGEN PO, Take 3 tablets by mouth every other day. Pill; alternate with powder form., Disp: , Rfl:    Ferrous Sulfate (IRON PO), Take by mouth., Disp: , Rfl:    fluticasone  (FLONASE ) 50 MCG/ACT nasal spray, Use 2 spray(s) in each nostril once daily, Disp: 16 g, Rfl: 0   ibuprofen  (ADVIL ) 200 MG tablet, Take 200 mg by mouth every 6 (six) hours as needed., Disp: , Rfl:    loratadine (CLARITIN) 10 MG tablet, Take 10 mg by mouth daily as needed for allergies or rhinitis., Disp: , Rfl:    magnesium citrate SOLN, Take 1 Bottle by mouth once., Disp: , Rfl:    metFORMIN  (GLUCOPHAGE ) 500 MG tablet, Take 1 tablet (500 mg total) by mouth 2 (two) times daily with a meal., Disp: 60 tablet, Rfl: 0   natalizumab  (TYSABRI ) 300 MG/15ML injection, Inject 300 mg into the vein every 28 (twenty-eight) days., Disp: , Rfl:    pantoprazole  (PROTONIX ) 40 MG tablet, Take 1 tablet (40 mg total) by mouth daily., Disp: 90 tablet, Rfl: 0   POTASSIUM BICARBONATE PO, Take by mouth., Disp: , Rfl:    Thiamine HCl (VITAMIN B-1 PO), Take by mouth., Disp: , Rfl:    VITAMIN D  PO, Take 5,000 Units by mouth daily., Disp: , Rfl:    Zinc  50 MG TABS, Take by mouth., Disp: , Rfl:    ammonium lactate  (AMLACTIN) 12 % lotion, Apply  1 Application  topically as needed for dry skin., Disp: 400 g, Rfl: 0   phentermine  37.5 MG capsule, Take 1 capsule (37.5 mg total) by mouth every morning., Disp: 30 capsule, Rfl: 5   topiramate  (TOPAMAX ) 50 MG tablet, Take 1 tablet (50 mg total) by mouth daily., Disp: 90 tablet, Rfl: 3  Current Facility-Administered Medications:    natalizumab  (TYSABRI ) 300 mg in sodium chloride  0.9 % 100 mL IVPB, 300 mg, Intravenous, Q28 days, Tamara [name], Charlie LABOR, MD  PAST MEDICAL HISTORY: Past Medical History:  Diagnosis Date   Allergy    Anemia    Bowel obstruction (HCC)    CKD (chronic kidney disease), stage II    based on labs   GERD (gastroesophageal reflux disease)    IBS (irritable bowel syndrome)    at age of 6   Multiple sclerosis (HCC)    Multiple sclerosis (HCC) 2018   Obesity    RBBB    Sciatica 2009   Vision abnormalities     PAST SURGICAL HISTORY: Past Surgical History:  Procedure Laterality Date   ABDOMINAL HYSTERECTOMY  01/30/2006   cervix and right ovary still intact   DENTAL SURGERY Right 12/2022   KNEE ARTHROSCOPY  2010 and 2011   Left knee, x2    FAMILY HISTORY: Family History  Problem Relation Age of Onset   Cancer Father        Gallbladder   Gallbladder disease Father    Hypertension Brother    Gallbladder disease Paternal Grandmother    Colon cancer Neg Hx    Colon polyps Neg Hx    Esophageal cancer Neg Hx    Kidney disease Neg Hx     SOCIAL HISTORY:  Social History   Socioeconomic History   Marital status: Widowed    Spouse name: Not on file   Number of children: 2   Years of education: Not on file   Highest education level: Not on file  Occupational History   Occupation: Science writer  Tobacco Use   Smoking status: Never   Smokeless tobacco: Never  Vaping Use   Vaping status: Never Used  Substance and Sexual Activity   Alcohol use: No    Alcohol/week: 0.0 standard drinks of alcohol   Drug use: No   Sexual activity: Not on file  Other Topics  Concern   Not on file  Social History Narrative   Not on file   Social Drivers of Health   Financial Resource Strain: Not on file  Food Insecurity: No Food Insecurity (03/21/2022)   Hunger Vital Sign    Worried About Running Out of Food in the Last Year: Never true    Ran Out of Food in the Last Year: Never true  Transportation Needs: No Transportation Needs (01/16/2022)   PRAPARE - Administrator, Civil Service (Medical): No    Lack of Transportation (Non-Medical): No  Physical Activity: Insufficiently Active (01/16/2022)   Exercise Vital Sign    Days of Exercise per Week: 7 days    Minutes of Exercise per Session: 20 min  Stress: Not on file  Social Connections: Not on file  Intimate Partner Violence: Not on file     PHYSICAL EXAM  Vitals:   08/20/23 1511  BP: 120/70  Pulse: 60  Weight: 177 lb 3.2 oz (80.4 kg)  Height: 5' 3.5 (1.613 m)      Body mass index is 30.9 kg/m.   General: The patient is well-developed and well-nourished and in  no acute distress   has 2+ edema in feet/ankles   Neurologic Exam  Mental status: The patient is alert and oriented x 3 at the time of the examination. The patient has apparent normal recent and remote memory, with an apparently normal attention span and concentration ability.   Speech is normal.  Cranial nerves: Extraocular movements are full. Facial strength and sensation is normal. Trapezius strength is normal.. Hearing appears to be symmetric.  Motor:  Muscle bulk is normal.   Muscle tone is normal. Strength is 5/5 except 4+/5 hip flexion (iliopsoas).    RAM was symmetric in hands though  Sensory:   She had normal sensation to vibration in the arms and legs.  She reported some altered sensation in the left hand compared to the right.    Coordination: Cerebellar testing reveals good finger-nose-finger and mild reduced left heel-to-shin bilaterally.  Gait and station: Station is normal.  Gait is wide.  Tandem is  poor.   She has a mild left foot drop the Romberg is negative.  Reflexes:  . Deep tendon reflexes are 3 and symmetric in the knees and ankles and 2 and symmetric in the arms.  No ankle clonus.     DIAGNOSTIC DATA (LABS, IMAGING, TESTING) - I reviewed patient records, labs, notes, testing and imaging myself where available.  Lab Results  Component Value Date   WBC 6.9 02/13/2023   HGB 11.3 02/13/2023   HCT 35.6 02/13/2023   MCV 88 02/13/2023   PLT 178 02/13/2023      Component Value Date/Time   NA 145 (H) 10/23/2022 1216   K 4.2 10/23/2022 1216   CL 110 (H) 10/23/2022 1216   CO2 23 10/23/2022 1216   GLUCOSE 87 10/23/2022 1216   GLUCOSE 94 09/07/2020 1405   BUN 16 10/23/2022 1216   CREATININE 0.89 10/23/2022 1216   CREATININE 1.01 07/08/2013 1440   CALCIUM 9.2 10/23/2022 1216   PROT 6.1 10/23/2022 1216   ALBUMIN 4.3 10/23/2022 1216   AST 13 10/23/2022 1216   ALT 12 10/23/2022 1216   ALKPHOS 75 10/23/2022 1216   BILITOT 0.2 10/23/2022 1216   GFRNONAA >60 09/07/2020 1405   GFRNONAA 64 07/08/2013 1440   GFRAA >60 07/10/2019 0519   GFRAA 74 07/08/2013 1440   Lab Results  Component Value Date   CHOL 202 (H) 10/23/2022   HDL 65 10/23/2022   LDLCALC 127 (H) 10/23/2022   TRIG 56 10/23/2022   CHOLHDL 3.0 03/31/2021   Lab Results  Component Value Date   HGBA1C 5.9 (H) 10/23/2022   Lab Results  Component Value Date   VITAMINB12 >2000 (H) 10/23/2022   Lab Results  Component Value Date   TSH 2.190 10/23/2022       ASSESSMENT AND PLAN  Multiple sclerosis (HCC) - Plan: Stratify JCV Antibody Test ([facility]), CBC with Differential/Platelet  Gait disturbance  Dysesthesia  High risk medication use - Plan: Stratify JCV Antibody Test ([facility]), CBC with Differential/Platelet  Hand numbness  1.   Continue Tysabri ..  Her MS has been stable.   No exacerbations.   Recheck JCV Ab today and about every 6 months.  2.   She will continue phentermine  for MS fatigue and  Topamax  for migraine.    Phentermine  to prevent weight gain and help MS fatigue 3.   Due to combination of physical and cognitive/fatigue issues, she is disabled and unable to work.  4.   Continue Vit D supplementation.    5.   Rtc  6 months    Jovana Rembold A. Vear, MD, PhD, DIEDRA  08/20/2023, 3:34 PM Certified in Neurology, Clinical Neurophysiology, Sleep Medicine, Pain Medicine and Neuroimaging Dir., [facility]  [facility] [address], [address] [place], [place] [address] [phone]

## 2023-08-21 ENCOUNTER — Telehealth: Payer: Self-pay | Admitting: *Deleted

## 2023-08-21 LAB — CBC WITH DIFFERENTIAL/PLATELET
Basophils Absolute: 0 x10E3/uL (ref 0.0–0.2)
Basos: 0 %
EOS (ABSOLUTE): 0.1 x10E3/uL (ref 0.0–0.4)
Eos: 1 %
Hematocrit: 40.7 % (ref 34.0–46.6)
Hemoglobin: 13 g/dL (ref 11.1–15.9)
Immature Grans (Abs): 0 x10E3/uL (ref 0.0–0.1)
Immature Granulocytes: 0 %
Lymphocytes Absolute: 2.9 x10E3/uL (ref 0.7–3.1)
Lymphs: 48 %
MCH: 28.5 pg (ref 26.6–33.0)
MCHC: 31.9 g/dL (ref 31.5–35.7)
MCV: 89 fL (ref 79–97)
Monocytes Absolute: 0.5 x10E3/uL (ref 0.1–0.9)
Monocytes: 9 %
Neutrophils Absolute: 2.5 x10E3/uL (ref 1.4–7.0)
Neutrophils: 42 %
Platelets: 183 x10E3/uL (ref 150–450)
RBC: 4.56 x10E6/uL (ref 3.77–5.28)
RDW: 14.2 % (ref 11.7–15.4)
WBC: 6 x10E3/uL (ref 3.4–10.8)

## 2023-08-21 NOTE — Telephone Encounter (Signed)
 Late entry from 08/20/23 per [facility]: she placed JCV lab in [facility] lockbox for pick up. Results pending.

## 2023-08-27 ENCOUNTER — Other Ambulatory Visit: Payer: Self-pay

## 2023-08-27 MED ORDER — PANTOPRAZOLE SODIUM 40 MG PO TBEC: 40.0000 mg | DELAYED_RELEASE_TABLET | Freq: Every day | ORAL | 0 refills | Status: DC

## 2023-08-27 NOTE — Telephone Encounter (Signed)
 Medication sent to pharmacy

## 2023-08-28 NOTE — Telephone Encounter (Signed)
 SABRA

## 2023-08-30 ENCOUNTER — Ambulatory Visit (INDEPENDENT_AMBULATORY_CARE_PROVIDER_SITE_OTHER): Admitting: Family Medicine

## 2023-08-31 ENCOUNTER — Non-Acute Institutional Stay (HOSPITAL_COMMUNITY)
Admission: RE | Admit: 2023-08-31 | Discharge: 2023-08-31 | Disposition: A | Discharge status: Home / Self Care | Source: Ambulatory Visit | Attending: Internal Medicine | Admitting: Internal Medicine

## 2023-08-31 DIAGNOSIS — G35 Multiple sclerosis: Secondary | ICD-10-CM | POA: Diagnosis present

## 2023-08-31 MED ORDER — SODIUM CHLORIDE 0.9 % IV SOLN: INTRAVENOUS | Status: DC | PRN

## 2023-08-31 MED ORDER — SODIUM CHLORIDE 0.9 % IV SOLN
300.0000 mg | INTRAVENOUS | Status: DC
  Administered 2023-08-31: 300 mg via INTRAVENOUS
  Filled 2023-08-31: qty 15

## 2023-08-31 NOTE — [place] Notes (Signed)
 PATIENT CARE CENTER NOTE     Diagnosis: Multiple Sclerosis ( HCC) [G35]      Provider: Vear Ade, MD     Procedure: Tysabri  300mg  infusion     Note: Patient received Tysabri  infusion (dose # 8 of 12) via PIV. No premeds required per orders. Tolerated infusion well with no adverse reaction. Vital signs stable.  Patient observed for 1 hour post-infusion. Printed AVS offered but patient refused. Infusion ordered for every 28 days and patient will scheduled next appointment at the front desk.  Patient alert, oriented and ambulatory at discharge.

## 2023-09-06 ENCOUNTER — Ambulatory Visit (INDEPENDENT_AMBULATORY_CARE_PROVIDER_SITE_OTHER): Admitting: Family Medicine

## 2023-09-06 ENCOUNTER — Encounter (INDEPENDENT_AMBULATORY_CARE_PROVIDER_SITE_OTHER): Payer: Self-pay | Admitting: Family Medicine

## 2023-09-06 VITALS — BP 125/79 | HR 67 | Temp 98.4°F | Ht 63.5 in | Wt 180.0 lb

## 2023-09-06 DIAGNOSIS — Z638 Other specified problems related to primary support group: Secondary | ICD-10-CM

## 2023-09-06 DIAGNOSIS — E66811 Obesity, class 1: Secondary | ICD-10-CM

## 2023-09-06 DIAGNOSIS — E669 Obesity, unspecified: Secondary | ICD-10-CM

## 2023-09-06 DIAGNOSIS — R7303 Prediabetes: Secondary | ICD-10-CM

## 2023-09-06 DIAGNOSIS — Z6831 Body mass index (BMI) 31.0-31.9, adult: Secondary | ICD-10-CM

## 2023-09-06 MED ORDER — METFORMIN HCL 500 MG PO TABS: 500.0000 mg | ORAL_TABLET | Freq: Two times a day (BID) | ORAL | 1 refills | Status: DC

## 2023-09-06 NOTE — [place] Notes (Signed)
 Office: [phone]  /  Fax: [phone]  WEIGHT SUMMARY AND BIOMETRICS  Anthropometric Measurements Height: 5' 3.5 (1.613 m) Weight: 180 lb (81.6 kg) BMI (Calculated): 31.38 Weight at Last Visit: 181lb Weight Lost Since Last Visit: 1lb Weight Gained Since Last Visit: 0lb Starting Weight: 194lb Total Weight Loss (lbs): 14 lb (6.35 kg)   Body Composition  Body Fat %: 42.1 % Fat Mass (lbs): 75.8 lbs Muscle Mass (lbs): 99.2 lbs Visceral Fat Rating : 11   Other Clinical Data Fasting: no Labs: no Today's Visit #: 10 Starting Date: 10/23/22    Chief Complaint: OBESITY    History of Present Illness Tamara [name] is a 63 year old female with obesity and prediabetes who presents for obesity treatment and [place] assessment.  She is focusing on dietary modifications to manage her obesity, primarily consuming broccoli and chicken to control sodium intake. She has reduced her intake of lunch meats due to their high sodium content and has substituted them with Healthy Choice meals that include vegetables and noodles. Her weight dropped to 175 pounds during a recent crisis but has since stabilized around 180 pounds.  She is addressing her prediabetes and is taking metformin , which she needs refilled. She has been trying to maintain her medication regimen but missed doses during a recent stressful period. She has five pills remaining and is unsure if she took her medication on July 15th due to emotional distress.  She has a history of multiple sclerosis and is under the care of Dr. Vear, her neurologist. She is taking phentermine  to help with MS-related fatigue and topiramate  for headaches.  She has been under significant stress due to personal and legal issues involving her family, which she detailed extensively. This stress has impacted her emotional well-being and adherence to her medication regimen. She is actively involved in legal proceedings to address these issues and is  supported by attorneys and mentors.      PHYSICAL EXAM:  Blood pressure 125/79, pulse 67, temperature 98.4 F (36.9 C), height 5' 3.5 (1.613 m), weight 180 lb (81.6 kg), SpO2 99%. Body mass index is 31.39 kg/m.  DIAGNOSTIC DATA REVIEWED:  BMET    Component Value Date/Time   NA 145 (H) 10/23/2022 1216   K 4.2 10/23/2022 1216   CL 110 (H) 10/23/2022 1216   CO2 23 10/23/2022 1216   GLUCOSE 87 10/23/2022 1216   GLUCOSE 94 09/07/2020 1405   BUN 16 10/23/2022 1216   CREATININE 0.89 10/23/2022 1216   CREATININE 1.01 07/08/2013 1440   CALCIUM 9.2 10/23/2022 1216   GFRNONAA >60 09/07/2020 1405   GFRNONAA 64 07/08/2013 1440   GFRAA >60 07/10/2019 0519   GFRAA 74 07/08/2013 1440   Lab Results  Component Value Date   HGBA1C 5.9 (H) 10/23/2022   HGBA1C 5.9 (H) 03/31/2021   Lab Results  Component Value Date   INSULIN  5.0 10/23/2022   Lab Results  Component Value Date   TSH 2.190 10/23/2022   CBC    Component Value Date/Time   WBC 6.0 08/20/2023 1646   WBC 7.0 09/07/2020 1405   RBC 4.56 08/20/2023 1646   RBC 4.00 09/07/2020 1405   HGB 13.0 08/20/2023 1646   HCT 40.7 08/20/2023 1646   PLT 183 08/20/2023 1646   MCV 89 08/20/2023 1646   MCH 28.5 08/20/2023 1646   MCH 28.5 09/07/2020 1405   MCHC 31.9 08/20/2023 1646   MCHC 31.6 09/07/2020 1405   RDW 14.2 08/20/2023 1646   Iron  Studies    Component Value Date/Time   IRON 65 07/21/2013 1519   TIBC 290 07/21/2013 1519   FERRITIN 69 07/21/2013 1519   IRONPCTSAT 22 07/21/2013 1519   Lipid Panel     Component Value Date/Time   CHOL 202 (H) 10/23/2022 1216   TRIG 56 10/23/2022 1216   HDL 65 10/23/2022 1216   CHOLHDL 3.0 03/31/2021 1058   LDLCALC 127 (H) 10/23/2022 1216   Hepatic Function Panel     Component Value Date/Time   PROT 6.1 10/23/2022 1216   ALBUMIN 4.3 10/23/2022 1216   AST 13 10/23/2022 1216   ALT 12 10/23/2022 1216   ALKPHOS 75 10/23/2022 1216   BILITOT 0.2 10/23/2022 1216      Component  Value Date/Time   TSH 2.190 10/23/2022 1216   Nutritional Lab Results  Component Value Date   VD25OH 71.9 10/23/2022   VD25OH 63.1 11/17/2021   VD25OH 60.3 03/31/2021     Assessment and Plan Assessment & Plan Stressed with dysfunctional family dynamics Experiencing significant stress due to ongoing family and legal issues, including a court case involving her grandchild and the loss of her home. Reports high stress levels since July 15th, affecting medication adherence and eating habits. Manages stress by staying focused, praying, and seeking support from attorneys and mentors.  Obesity Working on [employer] and has lost weight, dropping to 175 pounds during a recent crisis. Focused on maintaining a healthy diet, primarily consuming broccoli and chicken, and avoiding high-sodium foods like certain lunch meats. Aware of the impact of stress on weight retention due to cortisol and insulin  levels. - Continue current diet focusing on low-sodium foods and balanced meals. - Encourage weight loss goal of 5 pounds, focusing on fat loss rather than water weight.  Prediabetes Managing prediabetes with metformin  and dietary modifications. Working on incorporating protein, fruits, and vegetables into her diet and avoiding skipping meals. Recent stress has impacted medication adherence, but she is refocusing on health management. - Refill metformin  prescription. - Ensure adequate medication supply to last until next appointment.    She was informed of the importance of frequent follow up visits to maximize her success with intensive lifestyle modifications for her multiple health conditions.  Fu 8 weeks  Louann Penton, MD

## 2023-09-10 ENCOUNTER — Encounter (HOSPITAL_COMMUNITY)

## 2023-09-11 ENCOUNTER — Encounter (HOSPITAL_COMMUNITY)

## 2023-09-28 ENCOUNTER — Telehealth: Payer: Self-pay | Admitting: Neurology

## 2023-09-28 ENCOUNTER — Ambulatory Visit: Payer: Self-pay

## 2023-09-28 ENCOUNTER — Non-Acute Institutional Stay (HOSPITAL_COMMUNITY)

## 2023-09-28 NOTE — Telephone Encounter (Signed)
 FYI Only or Action Required?: FYI only for provider.  Patient was last seen in primary care on 09/06/2023 by Verdon Louann BIRCH, MD.  Called Nurse Triage reporting Chest Pain.  Symptoms began several days ago.  Interventions attempted: Nothing.  Symptoms are: gradually worsening.  Triage Disposition: Go to ED Now (Notify PCP)  Patient/caregiver understands and will follow disposition?: No, refuses disposition       Copied from CRM [id number]. Topic: Clinical - Red Word Triage >> Sep 28, 2023  9:19 AM Tamara [name] wrote: Red Word that prompted transfer to Nurse Triage: Excruciating pain in left arm that bothering heart Reason for Disposition  SEVERE chest pain  Patient sounds very sick or weak to the triager  Answer Assessment - Initial Assessment Questions 1. LOCATION: Where does it hurt?       Heart - electrical pulse like feeling excruciating pain 2. RADIATION: Does the pain go anywhere else? (e.g., into neck, jaw, arms, back)     L arm, shoulder to fingertips 3. ONSET: When did the chest pain begin? (Minutes, hours or days)      Past few days Endorses 1 week prior to chronic infusion (natalizumab  (TYSABRI )), sx occur  4. PATTERN: Does the pain come and go, or has it been constant since it started?  Does it get worse with exertion?      *No Answer* 5. DURATION: How long does it last (e.g., seconds, minutes, hours)     *No Answer* 6. SEVERITY: How bad is the pain?  (e.g., Scale 1-10; mild, moderate, or severe)     *No Answer* 7. CARDIAC RISK FACTORS: Do you have any history of heart problems or risk factors for heart disease? (e.g., angina, prior heart attack; diabetes, high blood pressure, high cholesterol, smoker, or strong family history of heart disease)     *No Answer* 8. PULMONARY RISK FACTORS: Do you have any history of lung disease?  (e.g., blood clots in lung, asthma, emphysema, birth control pills)     *No Answer* 9. CAUSE: What do you think is  causing the chest pain?     Related to MS - endorses that excruciating heart pain has never happened in the past 10. OTHER SYMPTOMS: Do you have any other symptoms? (e.g., dizziness, nausea, vomiting, sweating, fever, difficulty breathing, cough)       *No Answer* 11. PREGNANCY: Is there any chance you are pregnant? When was your last menstrual period?       *No Answer*  Answer Assessment - Initial Assessment Questions 1. SITUATION:  Document reason for call.     Pt requesting that she needs her infusion ASAP d/t relapsing sx, including excruciating CP. Triager attempted to advise ED for further evaluation/tx d/t new sx, but pt refusing.  2. BACKGROUND: Document any background information (e.g., prior calls, known psychiatric history)     N/a 3. ASSESSMENT: Document your nursing assessment.     Pt refusing disposition 4. RESPONSE: Document what your response or recommendation was.     Triager advised that pt should contact specialist that prescribes infusion for further assistance. Pt seems to be confused with the number she contacted as triager reinforced that E2C2 is with primary care with Dr. Athena office. Triager attempted to connect pt with  Dr. Duncan (neuro) office but had significant delay, so Triager called PCP CAL and spoke to [place]. Triager transferred pt to clinical personnel for further assistance and updated Kenneth on situation.    Pt was transferred to PCP clinical personnel  Gladys.  Protocols used: Chest Pain-A-AH, Difficult Call-A-AH

## 2023-09-28 NOTE — Telephone Encounter (Addendum)
 Call to patient states is having a lot of pain in her left arm and chest.  Has MS.  Was due to have an infusion today.  Was changed . Patient stated that she needs to get in contact with Dr. Zada office. Unable to get through.  Refuses to go to the ER.  Got number for [facility].  Was able to contact office .  Left message on Nurse line of the patient's Urgent need to be contacted to day and her refusal to the ER. .RTC to patient to inform her that an Urgent message has been left on the Nurse line.  Unable to leave a message on the patient's line.  RTC to [facility] spoke with receptionist. Receptionist to send message to Dr. Duncan nurse to call patient.

## 2023-09-28 NOTE — Telephone Encounter (Signed)
 Appears Tamara [name] Tysabri  infusion cx by [name] scheduled for today. Reason given: provider.  I called them at [phone] to try and get more info. Spoke w/ Lacinda. Transferred me to nurse, Jackolyn.  Do not have nurse on staff to be able to do infusion today. They made Tamara [name] aware of this.  I called Tamara [name] at [phone]. Tamara [name] reports electrical shock feeling that starts at top of Left arm and radiates down arm.  Feels it affects heart but denies chest pain/tightness or SOB. No personal hx heart issues. Always experiences this a week prior to infusion.  Has had to urinate more often. But denies signs/sx UTI. No other signs of illness. Aware Dr. Vear out of office but I will try to reach by phone and call her back with his recommendation.   I called and spoke with Dr. Vear who recommends she continue to monitor sx, no need to proceed to ED right now. If she does develop chest pain/SOB, she should go to ED ASAP for evaluation/treatment. I called Tamara [name] back and relayed. She verbalized understanding and appreciation.

## 2023-09-28 NOTE — Telephone Encounter (Signed)
 Gladys from Internal Medicine called because Pt is in a lot  pain and Pt was  suppose to have Infusion today However someone called to reschedule Pt appt  . Kenneth states Pt is  not doing well and not sure what to tell PT , Kenneth suggested For PT to go to ER . SABRA   Tried to get Gladys over to  Infusion but no answer.   Kenneth suggested to call PT AT  [phone]   Kenneth -[phone]

## 2023-10-02 ENCOUNTER — Non-Acute Institutional Stay (HOSPITAL_COMMUNITY)
Admission: RE | Admit: 2023-10-02 | Discharge: 2023-10-02 | Disposition: A | Discharge status: Home / Self Care | Source: Ambulatory Visit | Attending: Internal Medicine | Admitting: Internal Medicine

## 2023-10-02 DIAGNOSIS — G35 Multiple sclerosis: Secondary | ICD-10-CM | POA: Insufficient documentation

## 2023-10-02 MED ORDER — SODIUM CHLORIDE 0.9 % IV SOLN: INTRAVENOUS | Status: DC | PRN

## 2023-10-02 MED ORDER — SODIUM CHLORIDE 0.9 % IV SOLN
300.0000 mg | INTRAVENOUS | Status: DC
  Administered 2023-10-02: 300 mg via INTRAVENOUS
  Filled 2023-10-02: qty 15

## 2023-10-02 NOTE — Progress Notes (Signed)
 PATIENT CARE CENTER NOTE     Diagnosis: Multiple Sclerosis ( HCC) [G35]      Provider: Vear Ade, MD     Procedure: Tysabri  300mg  infusion     Note: Patient received Tysabri  infusion (dose # 9 of 12) via PIV. No premeds required per orders. Tolerated infusion well with no adverse reaction. Vital signs stable. Printed AVS offered but patient refused. Infusion ordered for every 28 days and patient will scheduled next appointment at the front desk.  Patient alert, oriented and ambulatory at discharge.

## 2023-10-29 ENCOUNTER — Telehealth (HOSPITAL_COMMUNITY): Payer: Self-pay

## 2023-10-29 NOTE — Telephone Encounter (Signed)
 Auth Submission: NO AUTH NEEDED Site of care: Site of care: [facility] Payer: [facility] Medicaid Medication & CPT/J Code(s) submitted: Tysabri  (Natalizumab ) [id number] Diagnosis Code: G35 Route of submission (phone, fax, portal): portal Phone # Fax # Auth type: Buy/Bill HB Units/visits requested: 300mg  q4weeks Reference number:  Approval from: 10/29/23 to 01/30/24

## 2023-10-30 ENCOUNTER — Ambulatory Visit (HOSPITAL_COMMUNITY)
Admission: RE | Admit: 2023-10-30 | Discharge: 2023-10-30 | Disposition: A | Discharge status: Home / Self Care | Source: Ambulatory Visit | Attending: Neurology | Admitting: Neurology

## 2023-10-30 ENCOUNTER — Non-Acute Institutional Stay (HOSPITAL_COMMUNITY): Admission: RE | Admit: 2023-10-30 | Source: Ambulatory Visit

## 2023-10-30 VITALS — BP 124/80 | HR 65 | Temp 97.3°F | Resp 16

## 2023-10-30 DIAGNOSIS — Z79620 Long term (current) use of immunosuppressive biologic: Secondary | ICD-10-CM | POA: Diagnosis not present

## 2023-10-30 DIAGNOSIS — G35 Multiple sclerosis: Secondary | ICD-10-CM | POA: Insufficient documentation

## 2023-10-30 DIAGNOSIS — G35D Multiple sclerosis, unspecified: Secondary | ICD-10-CM

## 2023-10-30 MED ORDER — SODIUM CHLORIDE 0.9 % IV SOLN: INTRAVENOUS | Status: DC

## 2023-10-30 MED ORDER — SODIUM CHLORIDE 0.9 % IV SOLN
300.0000 mg | Freq: Once | INTRAVENOUS | Status: AC
  Administered 2023-10-30: 300 mg via INTRAVENOUS
  Filled 2023-10-30: qty 15

## 2023-10-30 NOTE — Progress Notes (Signed)
 PATIENT CARE CENTER NOTE     Diagnosis: Multiple Sclerosis ( HCC) [G35]      Provider: Vear Ade, MD     Procedure: Tysabri  300mg  infusion     Note: Patient received Tysabri  infusion (dose # 10 of 12) via PIV. No premeds required per orders. Tolerated infusion well with no adverse reaction. Vital signs stable. Printed AVS offered but patient refused. Infusion ordered for every 28 days and patient will scheduled next appointment at the front desk.  Patient alert, oriented and ambulatory at discharge.

## 2023-11-07 ENCOUNTER — Encounter (INDEPENDENT_AMBULATORY_CARE_PROVIDER_SITE_OTHER): Admitting: Family Medicine

## 2023-11-12 NOTE — Progress Notes (Signed)
 error

## 2023-11-26 ENCOUNTER — Telehealth: Payer: Self-pay | Admitting: *Deleted

## 2023-11-26 NOTE — Telephone Encounter (Signed)
 Will froward to Dr. Rosan.                     Copied from CRM [id number]. Topic: Referral - Request for Referral >> Nov 26, 2023 10:43 AM Chiquita SQUIBB wrote: Did the patient discuss referral with their provider in the last year? No (If No - schedule appointment) (If Yes - send message)  Appointment offered? No  Type of order/referral and detailed reason for visit: A dentist that can put her to sleep due to her MS, patient has an infection in her tooth.   Preference of office, provider, location: an office that accepts Medicaid in [place]  If referral order, have you been seen by this specialty before? No  (If Yes, this issue or another issue? When? Where?  Can we respond through MyChart? Yes

## 2023-11-26 NOTE — Telephone Encounter (Signed)
 Copied from CRM [id number]. Topic: Clinical - Prescription Issue >> Nov 26, 2023 10:48 AM Chiquita SQUIBB wrote: Reason for CRM: Patient called in to let the doctor know the dentist prescribed her Clindamycin  300 MG for her tooth infection and it has made her sick. Patient is going to reach out to her dentist but would like the doctor to know she can not take this medication and would like it noted.

## 2023-11-27 ENCOUNTER — Encounter (INDEPENDENT_AMBULATORY_CARE_PROVIDER_SITE_OTHER): Payer: Self-pay | Admitting: Family Medicine

## 2023-11-27 ENCOUNTER — Ambulatory Visit: Payer: Self-pay

## 2023-11-27 ENCOUNTER — Ambulatory Visit (INDEPENDENT_AMBULATORY_CARE_PROVIDER_SITE_OTHER): Admitting: Family Medicine

## 2023-11-27 ENCOUNTER — Ambulatory Visit: Payer: Self-pay | Admitting: Student

## 2023-11-27 VITALS — BP 132/81 | HR 64 | Temp 98.0°F | Ht 63.5 in | Wt 177.0 lb

## 2023-11-27 DIAGNOSIS — R7303 Prediabetes: Secondary | ICD-10-CM

## 2023-11-27 DIAGNOSIS — E669 Obesity, unspecified: Secondary | ICD-10-CM | POA: Diagnosis not present

## 2023-11-27 DIAGNOSIS — K047 Periapical abscess without sinus: Secondary | ICD-10-CM

## 2023-11-27 DIAGNOSIS — Z6830 Body mass index (BMI) 30.0-30.9, adult: Secondary | ICD-10-CM

## 2023-11-27 DIAGNOSIS — Z6833 Body mass index (BMI) 33.0-33.9, adult: Secondary | ICD-10-CM

## 2023-11-27 MED ORDER — METFORMIN HCL 500 MG PO TABS: 500.0000 mg | ORAL_TABLET | Freq: Two times a day (BID) | ORAL | 1 refills | Status: DC

## 2023-11-27 NOTE — Telephone Encounter (Signed)
 RTC to patient.  Saw Dentist for dental pain.  Was referred to and endodontist for sedation.  Was given an antibiotic that she is unable to take.  Needs a different antibiotic ordered prior to going to the Specialty Dentist.  Her insurance will do cover the referred dentist per patient.

## 2023-11-27 NOTE — Telephone Encounter (Signed)
 FYI Only or Action Required?: FYI only for provider.  Patient was last seen in primary care on 09/06/2023 by Tamara Louann BIRCH, MD.  Called Nurse Triage reporting Dental Pain.  Symptoms began a week ago.  Interventions attempted: OTC medications: Ibuprofen  and Prescription medications: Tylenol  with codeine .  Symptoms are: gradually worsening.  Triage Disposition: See HCP Within 4 Hours (Or PCP Triage)  Patient/caregiver understands and will follow disposition?: Yes          Copied from CRM [id number]. Topic: Clinical - Red Word Triage >> Nov 27, 2023 11:25 AM DeAngela L wrote: Red Word that prompted transfer to Nurse Triage: patient calling back after not hearing back from her provider yesterday about bad tooth ache, states the antibiotics this makes her very sick to her stomach and causes a metal taste in her mouth so she can't take this  The patient had a bad fever last night and she state off and on she has a fever hot and cold chills  and now her tongue is swelling up in the on the side in the back of her mouth possibly cause the tooth is cracked and pressing into her tongue, difficult to talk from this and she is breathing through her mouth cause her nasal passage is effected by this also  This is a serious concern for patient due to her MS diagnosis   Pt num [phone] Reason for Disposition  [1] SEVERE pain (e.g., excruciating, unable to eat, unable to do any normal activities) AND [2] not improved 2 hours after pain medicine  Answer Assessment - Initial Assessment Questions 1. LOCATION: Which tooth is hurting?  (e.g., right-side/left-side, upper/lower, front/back)     Back area of the mouth  2. ONSET: When did the toothache start?  (e.g., hours, days)      1 week ago  3. SEVERITY: How bad is the toothache?  (Scale 1-10; mild, moderate or severe)     Severe  4. SWELLING: Is there any visible swelling of your face?     Yes  5. OTHER SYMPTOMS: Do you have any  other symptoms? (e.g., fever)     Fever  Patient taking Ibuprofen  and tylenol  with codeine  for symptoms. She states she was also taking antibiotics for symptoms but had a reaction to them.  Patient states she was told by a dentist that she has an infection and needs to get infection cleared before she can have dental work done. Currently having difficulty talking due to symptoms.  Protocols used: Toothache-A-AH

## 2023-11-27 NOTE — Progress Notes (Signed)
 Office: [phone]  /  Fax: [phone]  WEIGHT SUMMARY AND BIOMETRICS  Anthropometric Measurements Height: 5' 3.5 (1.613 m) Weight: 177 lb (80.3 kg) BMI (Calculated): 30.86 Weight at Last Visit: 180 lb Weight Lost Since Last Visit: 3 lb Weight Gained Since Last Visit: 0 Starting Weight: 194 lb Total Weight Loss (lbs): 17 lb (7.711 kg) Peak Weight: 223 lb   Body Composition  Body Fat %: 42.2 % Fat Mass (lbs): 74.6 lbs Muscle Mass (lbs): 97.2 lbs Visceral Fat Rating : 11   Other Clinical Data Fasting: yes Labs: no Today's Visit #: 11 Starting Date: 10/23/22    Chief Complaint: OBESITY   History of Present Illness Tamara [name] is a 63 year old female who presents for obesity treatment and progress assessment.  She is following a category one eating plan, which she adheres to most of the time. A recent toothache has made eating difficult. She is attempting to increase her intake of fruits, vegetables, and protein, but struggles with hydration and occasionally skips meals. She generally does not achieve seven to nine hours of sleep per night. She tries to increase her daily steps but does not engage in formal exercise. She has lost three pounds in the last two months, and her BMI is now 30.9.  She is being treated for prediabetes and is taking metformin  500 mg twice a day.  She reports a severe toothache that has been affecting her ability to eat and drink. She attempted to see her dentist, Dr. Vann, but due to her multiple sclerosis, she requires sedation for dental procedures, which complicates treatment. The tooth is cracked and puncturing her tongue, causing significant pain. She has been using ibuprofen  and topical numbing agents to manage the pain temporarily. She was prescribed an antibiotic for the tooth infection, but it caused nausea, bloating, and shortness of breath, leading her to discontinue it. She is unable to tolerate yogurt due to weight concerns and  prefers herbal tea to manage her MS symptoms and reduce Tylenol  intake.  She has multiple sclerosis, which complicates dental procedures as she requires sedation due to difficulty with numbing. She uses herbal tea to help manage her MS symptoms and reduce the need for Tylenol , which she takes at a reduced dose of two 650 mg pills per day instead of six.      PHYSICAL EXAM:  Blood pressure 132/81, pulse 64, temperature 98 F (36.7 C), height 5' 3.5 (1.613 m), weight 177 lb (80.3 kg), SpO2 100%. Body mass index is 30.86 kg/m.  DIAGNOSTIC DATA REVIEWED:  BMET    Component Value Date/Time   NA 145 (H) 10/23/2022 1216   K 4.2 10/23/2022 1216   CL 110 (H) 10/23/2022 1216   CO2 23 10/23/2022 1216   GLUCOSE 87 10/23/2022 1216   GLUCOSE 94 09/07/2020 1405   BUN 16 10/23/2022 1216   CREATININE 0.89 10/23/2022 1216   CREATININE 1.01 07/08/2013 1440   CALCIUM 9.2 10/23/2022 1216   GFRNONAA >60 09/07/2020 1405   GFRNONAA 64 07/08/2013 1440   GFRAA >60 07/10/2019 0519   GFRAA 74 07/08/2013 1440   Lab Results  Component Value Date   HGBA1C 5.9 (H) 10/23/2022   HGBA1C 5.9 (H) 03/31/2021   Lab Results  Component Value Date   INSULIN  5.0 10/23/2022   Lab Results  Component Value Date   TSH 2.190 10/23/2022   CBC    Component Value Date/Time   WBC 6.0 08/20/2023 1646   WBC 7.0 09/07/2020 1405  RBC 4.56 08/20/2023 1646   RBC 4.00 09/07/2020 1405   HGB 13.0 08/20/2023 1646   HCT 40.7 08/20/2023 1646   PLT 183 08/20/2023 1646   MCV 89 08/20/2023 1646   MCH 28.5 08/20/2023 1646   MCH 28.5 09/07/2020 1405   MCHC 31.9 08/20/2023 1646   MCHC 31.6 09/07/2020 1405   RDW 14.2 08/20/2023 1646   Iron Studies    Component Value Date/Time   IRON 65 07/21/2013 1519   TIBC 290 07/21/2013 1519   FERRITIN 69 07/21/2013 1519   IRONPCTSAT 22 07/21/2013 1519   Lipid Panel     Component Value Date/Time   CHOL 202 (H) 10/23/2022 1216   TRIG 56 10/23/2022 1216   HDL 65 10/23/2022  1216   CHOLHDL 3.0 03/31/2021 1058   LDLCALC 127 (H) 10/23/2022 1216   Hepatic Function Panel     Component Value Date/Time   PROT 6.1 10/23/2022 1216   ALBUMIN 4.3 10/23/2022 1216   AST 13 10/23/2022 1216   ALT 12 10/23/2022 1216   ALKPHOS 75 10/23/2022 1216   BILITOT 0.2 10/23/2022 1216      Component Value Date/Time   TSH 2.190 10/23/2022 1216   Nutritional Lab Results  Component Value Date   VD25OH 71.9 10/23/2022   VD25OH 63.1 11/17/2021   VD25OH 60.3 03/31/2021     Assessment and Plan Assessment & Plan Obesity and dental abscess BMI of 30.9. She has lost three pounds in the last two months. Challenges include difficulty eating due to a toothache, inadequate hydration, and insufficient sleep. She is attempting to follow a category one eating plan, increase daily steps, and consume more fruits, vegetables, and protein. She struggles with hydration and skipping meals. The toothache from her dental abscess has made eating difficult, and she is unable to tolerate the prescribed antibiotic due to side effects. - Continue category one eating plan as tolerated. - Encouraged hydration with lukewarm water. - Advised on soft foods that are not too hot or cold. - Recommended protein drinks if needed. - Prioritized fixing the toothache to improve eating and hydration. - Scheduled follow-up appointment in 3-4 weeks. - Continue to follow up with her dentist as her abscess will not improve without proper treatment  Prediabetes Managed with metformin  500 mg twice daily. She requests a refill and reports adherence to the medication regimen. - Refilled metformin  500 mg twice daily.   Solene was counseled on the importance of maintaining healthy lifestyle habits, including balanced nutrition, regular physical activity, and behavioral modifications, while taking antiobesity medication.  Patient verbalized understanding that medication is an adjunct to, not a replacement for, lifestyle  changes and that the long-term success and weight maintenance depend on continued adherence to these strategies.   Davin was informed of the importance of frequent follow up visits to maximize her success with intensive lifestyle modifications for her obesity and obesity related health conditions as recommended by USPSTF and CMS guidelines   Louann Penton, MD

## 2023-11-28 ENCOUNTER — Ambulatory Visit: Admitting: Student

## 2023-11-28 VITALS — BP 120/79 | HR 71 | Wt 181.0 lb

## 2023-11-28 DIAGNOSIS — Z881 Allergy status to other antibiotic agents status: Secondary | ICD-10-CM

## 2023-11-28 DIAGNOSIS — K0889 Other specified disorders of teeth and supporting structures: Secondary | ICD-10-CM | POA: Diagnosis present

## 2023-11-28 DIAGNOSIS — Z1231 Encounter for screening mammogram for malignant neoplasm of breast: Secondary | ICD-10-CM | POA: Insufficient documentation

## 2023-11-28 MED ORDER — AMOXICILLIN 500 MG PO TABS: 500.0000 mg | ORAL_TABLET | Freq: Three times a day (TID) | ORAL | 0 refills | Status: AC

## 2023-11-28 NOTE — Telephone Encounter (Signed)
 Duplicate, see nurse triage dated yesterday.This encounter was created in error - please disregard.

## 2023-11-28 NOTE — Telephone Encounter (Signed)
 Patient called back today to inform us  she missed her appointment yesterday due to she had taken some pain medication and had forgotten about the appointment. She states she is holding off taking any pain meds this morning because she will have to drive herself today. Patient scheduled this afternoon Dr Nooruddin at 1:45pm.

## 2023-11-28 NOTE — Assessment & Plan Note (Addendum)
 Severe dental pain from cracked lower molar with tongue injury and swelling. Difficulty with anesthesia due to potential MS-related complications. Insurance and scheduling issues delayed dental intervention. Previous antibiotics not tolerated; amoxicillin prescribed. - Prescribed amoxicillin 500 mg orally three times daily for 7 days. - Sent prescription to [facility] on [address]. - Advised to continue seeking dental care and get on a waiting list for cancellations. - Discussed importance of dental intervention to prevent recurrence of pain and discomfort. - Of note has had multiple rounds of amoxicillin previously for various things with no adverse effects

## 2023-11-28 NOTE — Assessment & Plan Note (Signed)
Overdue for mammogram, ordered

## 2023-11-28 NOTE — Telephone Encounter (Signed)
 Seen today and addressed in resident visit

## 2023-11-28 NOTE — Progress Notes (Signed)
 CC: Dental pain  HPI:  [name] is a 63 y.o. female living with a history stated below and presents today for tooth pain. Please see problem based assessment and plan for additional details.  Discussed the use of AI scribe software for clinical note transcription with the patient, who gave verbal consent to proceed.  History of Present Illness Tamara [name] is a 63 year old female with multiple sclerosis who presents with severe tooth pain.  She has been experiencing severe tooth pain for over a week, which began three days prior to November 21, 2023. She reports that the pain is due to a cracked tooth with a piece sticking into her tongue, which she believes is causing swelling and significant discomfort. She describes the pain as 'horrible' and notes that it is so severe that she cannot talk without medication.  She has been taking 800 mg of ibuprofen , which she last took at 4 AM on the day of the visit, but reports that the effect is wearing off. Despite a strong dislike for medication, she acknowledges its necessity due to the pain.  She has visited her dentist, Dr. Odis, three times. Despite multiple attempts to numb the area with four to five shots, the nerve around the tooth does not become numb, preventing the dentist from performing the necessary dental work. The dentist told her that the procedure would require anesthesia and that he could not proceed because he was unable to numb the area adequately.  She has been prescribed antibiotics but reports an adverse reaction to the 'blue stuff' on the clindamycin , which causes itching. She has a history of taking penicillin without issues, suggesting the reaction may be due to the pill's coating.  She has attempted to schedule an appointment with an endodontist but has faced challenges due to her full schedule and her need for Medicaid coverage. She has spent significant time on the phone with Medicaid, which only provides a limited  list of dentists, complicating her ability to find care.    Past Medical History:  Diagnosis Date   Allergy    Anemia    Bowel obstruction (HCC)    CKD (chronic kidney disease), stage II    based on labs   GERD (gastroesophageal reflux disease)    IBS (irritable bowel syndrome)    at age of 16   Multiple sclerosis    Multiple sclerosis 2018   Obesity    RBBB    Sciatica 2009   Vision abnormalities     Current Outpatient Medications on File Prior to Visit  Medication Sig Dispense Refill   acetaminophen  (TYLENOL ) 650 MG CR tablet Take 1,300 mg by mouth 3 (three) times daily as needed for pain.     ascorbic acid (VITAMIN C) 500 MG tablet Take 1,500 mg by mouth daily.     bacitracin  ointment Apply 1 Application topically 2 (two) times daily. 120 g 0   Cholecalciferol  (VITAMIN D3) 50 MCG (2000 UT) TABS Take 5 tablets by mouth daily.     ciclopirox  (PENLAC ) 8 % solution Apply topically at bedtime. Apply over nail and surrounding skin. Apply daily over previous coat. After seven (7) days, may remove with alcohol and continue cycle. 6.6 mL 2   Coenzyme Q10-Fish Oil-Vit E (CO-Q 10 OMEGA-3 FISH OIL PO) Take 1 capsule by mouth daily.     COLLAGEN PO Take 1 Dose by mouth every other day. Powder; alternate with pill form.     COLLAGEN PO  Take 3 tablets by mouth every other day. Pill; alternate with powder form.     Ferrous Sulfate (IRON PO) Take by mouth.     fluticasone  (FLONASE ) 50 MCG/ACT nasal spray Use 2 spray(s) in each nostril once daily 16 g 0   ibuprofen  (ADVIL ) 200 MG tablet Take 200 mg by mouth every 6 (six) hours as needed.     loratadine (CLARITIN) 10 MG tablet Take 10 mg by mouth daily as needed for allergies or rhinitis.     magnesium citrate SOLN Take 1 Bottle by mouth once.     metFORMIN  (GLUCOPHAGE ) 500 MG tablet Take 1 tablet (500 mg total) by mouth 2 (two) times daily with a meal. 60 tablet 1   natalizumab  (TYSABRI ) 300 MG/15ML injection Inject 300 mg into the vein every  28 (twenty-eight) days.     pantoprazole  (PROTONIX ) 40 MG tablet Take 1 tablet (40 mg total) by mouth daily. 90 tablet 0   phentermine  37.5 MG capsule Take 1 capsule (37.5 mg total) by mouth every morning. 30 capsule 5   POTASSIUM BICARBONATE PO Take by mouth.     Thiamine HCl (VITAMIN B-1 PO) Take by mouth.     topiramate  (TOPAMAX ) 50 MG tablet Take 1 tablet (50 mg total) by mouth daily. 90 tablet 3   VITAMIN D  PO Take 5,000 Units by mouth daily.     Zinc  50 MG TABS Take by mouth.     Current Facility-Administered Medications on File Prior to Visit  Medication Dose Route Frequency Provider Last Rate Last Admin   natalizumab  (TYSABRI ) 300 mg in sodium chloride  0.9 % 100 mL IVPB  300 mg Intravenous Q28 days Sater, Charlie LABOR, MD        Family History  Problem Relation Age of Onset   Cancer Father        Gallbladder   Gallbladder disease Father    Hypertension Brother    Gallbladder disease Paternal Grandmother    Colon cancer Neg Hx    Colon polyps Neg Hx    Esophageal cancer Neg Hx    Kidney disease Neg Hx     Social History   Socioeconomic History   Marital status: Widowed    Spouse name: Not on file   Number of children: 2   Years of education: Not on file   Highest education level: Not on file  Occupational History   Occupation: Science Writer  Tobacco Use   Smoking status: Never   Smokeless tobacco: Never  Vaping Use   Vaping status: Never Used  Substance and Sexual Activity   Alcohol use: No    Alcohol/week: 0.0 standard drinks of alcohol   Drug use: No   Sexual activity: Not on file  Other Topics Concern   Not on file  Social History Narrative   Not on file   Social Drivers of Health   Financial Resource Strain: Not on file  Food Insecurity: No Food Insecurity (11/28/2023)   Hunger Vital Sign    Worried About Running Out of Food in the Last Year: Never true    Ran Out of Food in the Last Year: Never true  Transportation Needs: No Transportation  Needs (01/16/2022)   PRAPARE - Administrator, Civil Service (Medical): No    Lack of Transportation (Non-Medical): No  Physical Activity: Insufficiently Active (01/16/2022)   Exercise Vital Sign    Days of Exercise per Week: 7 days    Minutes of Exercise per Session: 20  min  Stress: Not on file  Social Connections: Not on file  Intimate Partner Violence: Not on file    Review of Systems: ROS negative except for what is noted on the assessment and plan.  Vitals:   11/28/23 1350  BP: 120/79  Pulse: 71  SpO2: 97%  Weight: 181 lb (82.1 kg)    Physical Exam: Constitutional: well-appearing female in no acute distress HENT: normocephalic atraumatic, mucous membranes moist.  Clear inflammation in the right lower molars with discoloration Eyes: conjunctiva non-erythematous Neck: supple Cardiovascular: regular rate and rhythm, no m/r/g Pulmonary/Chest: normal work of breathing on room air, lungs clear to auscultation bilaterally   Assessment & Plan:   Tooth pain Severe dental pain from cracked lower molar with tongue injury and swelling. Difficulty with anesthesia due to potential MS-related complications. Insurance and scheduling issues delayed dental intervention. Previous antibiotics not tolerated; amoxicillin prescribed. - Prescribed amoxicillin 500 mg orally three times daily for 7 days. - Sent prescription to [facility] on [address]. - Advised to continue seeking dental care and get on a waiting list for cancellations. - Discussed importance of dental intervention to prevent recurrence of pain and discomfort. - Of note has had multiple rounds of amoxicillin previously for various things with no adverse effects  Screening mammogram, encounter for Overdue for mammogram, ordered    Patient discussed with Dr. CHARLENA Rosan Roetta Nelia, M.D. [facility] Internal Medicine, PGY-3 Pager: [phone] Date 11/28/2023 Time 2:32 PM

## 2023-11-28 NOTE — Patient Instructions (Signed)
 Thank you so much for coming to the clinic today!   I have sent in some antibiotics, but I think it's important you see a dentist as soon as you can or at least get you on the list.  I have placed an order for a screening mammogram  If you have any questions please feel free to the call the clinic at anytime at [phone]. It was a pleasure seeing you!  Best, Dr. Jemiah Ellenburg

## 2023-11-29 NOTE — Progress Notes (Signed)
 Internal Medicine Clinic Attending  Case discussed with the resident at the time of the visit.  We reviewed the resident's history and exam and pertinent patient test results.  I agree with the assessment, diagnosis, and plan of care documented in the resident's note.

## 2023-12-04 ENCOUNTER — Ambulatory Visit (HOSPITAL_COMMUNITY)
Admission: RE | Admit: 2023-12-04 | Discharge: 2023-12-04 | Disposition: A | Discharge status: Home / Self Care | Source: Ambulatory Visit | Attending: Neurology | Admitting: Neurology

## 2023-12-04 ENCOUNTER — Encounter (HOSPITAL_COMMUNITY)

## 2023-12-04 VITALS — BP 119/68 | HR 60 | Temp 97.5°F | Resp 16

## 2023-12-04 DIAGNOSIS — G35D Multiple sclerosis, unspecified: Secondary | ICD-10-CM | POA: Diagnosis present

## 2023-12-04 MED ORDER — SODIUM CHLORIDE 0.9 % IV SOLN: INTRAVENOUS | Status: DC

## 2023-12-04 MED ORDER — SODIUM CHLORIDE 0.9 % IV SOLN
300.0000 mg | Freq: Once | INTRAVENOUS | Status: AC
  Administered 2023-12-04: 300 mg via INTRAVENOUS
  Filled 2023-12-04: qty 15

## 2023-12-04 NOTE — Progress Notes (Signed)
 PATIENT CARE CENTER NOTE  Diagnosis: Multiple Sclerosis ( HCC) [G35]    Provider: Vear Ade, MD   Procedure: Tysabri  300mg  infusion   Note: Patient received Tysabri  infusion (dose #11 of 12) via PIV. No pre-medications per orders. Pt tolerated the infusion with no adverse reaction. Vital signs are stable. AVS offered, but pt declined. Pt advised to schedule next infusion for 28 days at the front desk. Pt is alert, oriented, and ambulatory at discharge.

## 2023-12-05 ENCOUNTER — Telehealth: Payer: Self-pay

## 2023-12-05 DIAGNOSIS — G35D Multiple sclerosis, unspecified: Secondary | ICD-10-CM

## 2023-12-13 ENCOUNTER — Telehealth: Payer: Self-pay

## 2023-12-13 ENCOUNTER — Ambulatory Visit: Admitting: Dermatology

## 2023-12-13 ENCOUNTER — Encounter: Payer: Self-pay | Admitting: Dermatology

## 2023-12-13 VITALS — BP 124/88 | HR 46

## 2023-12-13 DIAGNOSIS — L649 Androgenic alopecia, unspecified: Secondary | ICD-10-CM | POA: Diagnosis not present

## 2023-12-13 DIAGNOSIS — Z79899 Other long term (current) drug therapy: Secondary | ICD-10-CM

## 2023-12-13 MED ORDER — SAFETY SEAL MISCELLANEOUS MISC: 1.0000 | Freq: Every morning | 6 refills | Status: AC

## 2023-12-13 NOTE — Progress Notes (Signed)
   New Patient Visit   Subjective  Tamara [name] is a 63 y.o. female who presents for the following: Dark Eye Circles and Hair Thinning  Patient states she has [employer] and Hair Thinning located at the scalp that she would like to have examined. Patient reports the hair thinning started about 8 months. She reports the areas are not bothersome. Patient reports she has not previously been treated for these areas. Patient reports she tried applying otc topical 5% Minoxidil for a few months with no improvement. Patient denies Hx of bx. Patient denies family history of hair thinning. Patient reports her previous hair care practices consisted of Relaxers, tight style and heat. She reports she washes her about 2 times monthly due to her diagnosis of MS. She has had sister locs for about 3 years and she gets a re-twist every 8 weeks.  Patient provided verbal consent for the use of an AI-assisted program to generate a detailed after-visit summary. The patient understands that the AI tool is used to support clinical documentation and that all information will be reviewed and verified by the healthcare provider.  The following portions of the chart were reviewed this encounter and updated as appropriate: medications, allergies, medical history  Review of Systems:  No other skin or systemic complaints except as noted in HPI or Assessment and Plan.  Objective  Well appearing patient in no apparent distress; mood and affect are within normal limits.  A focused examination was performed of the following areas: Scalp and Underneath Eyes  Relevant exam findings are noted in the Assessment and Plan.                     Assessment & Plan   ANDROGENETIC ALOPECIA (FEMALE PATTERN HAIR LOSS) Exam: Diffuse thinning of the crown and widening of the midline part with retention of the frontal hairline  Flared  Female Androgenic Alopecia is a chronic condition related to genetics and/or hormonal  changes.  In women androgenetic alopecia is commonly associated with menopause but may occur any time after puberty.  It causes hair thinning primarily on the crown with widening of the part and temporal hairline recession.  Can use OTC Rogaine (minoxidil) 5% solution/foam as directed.    Treatment Plan: - Prescribed Hormonic Hair Solution to apply to the scalp every morning - Recommended taking daily Collagen supplements (Vivscal and Vital Protein) - Plan to follow up in 6 months to reassess  Long term medication management.  Patient is using long term (months to years) prescription medication  to control their dermatologic condition.  These medications require periodic monitoring to evaluate for efficacy and side effects and may require periodic laboratory monitoring.    Dark Eye Circles  Treatment Plan: - Recommended applying The Ordinary Caffeine Serum  ANDROGENIC ALOPECIA   Related Medications Safety Seal Miscellaneous MISC Apply 1 Application topically in the morning. Medication Name: Hormonic Hair Solution (Minoxidil 8%, Finasteride 0.1%)  Return in about 6 months (around 06/11/2024) for Androgenetic Alopecia F/U.  I, Jetta Ager, am acting as neurosurgeon for [employer], DO.  Documentation: I have reviewed the above documentation for accuracy and completeness, and I agree with the above.  Delon Lenis, DO

## 2023-12-13 NOTE — Progress Notes (Signed)
 Complex Care Management Note Care Guide Note  12/13/2023 Name: Tamara [name] MRN: [id number] DOB: 06/17/1960   Complex Care Management Outreach Attempts: An unsuccessful telephone outreach was attempted today to offer the patient information about available complex care management services.  Follow Up Plan:  Additional outreach attempts will be made to offer the patient complex care management information and services.   Encounter Outcome:  No Answer-No Voicemail  Leotis Rase [facility], [facility] Guide  Direct Dial: [phone]  Fax [phone]

## 2023-12-13 NOTE — Patient Instructions (Addendum)
 VISIT SUMMARY:  Today, we discussed your concerns about hair thinning, which you have been experiencing for the past eight months. We reviewed your previous treatments and current symptoms, and I provided a new treatment plan to help manage and improve your condition.  YOUR PLAN:  -ANDROGENETIC ALOPECIA:  Androgenetic alopecia is a common form of hair loss that occurs due to genetic factors and hormonal changes, often seen post-menopause. We discussed that your hair thinning is primarily at the temples and crown.   I recommend using a combination of minoxidil and finasteride to help block the hormone DHT, promote hair growth, and increase blood flow to the scalp.   You should apply the compounded minoxidil with finasteride in the morning to avoid unwanted hair growth on the face. It's important to continue this treatment to maintain any regrown hair and prevent further loss.   Additionally, I suggest taking a collagen supplement (Vital Proteins) to strengthen your hair.  INSTRUCTIONS:  Please follow the prescribed treatment plan and apply the compounded minoxidil with finasteride every morning. Continue using the collagen supplement as recommended. We will have a follow-up appointment in six months to assess your progress and make any necessary adjustments to your treatment.              Important Information   Due to recent changes in healthcare laws, you may see results of your pathology and/or laboratory studies on MyChart before the doctors have had a chance to review them. We understand that in some cases there may be results that are confusing or concerning to you. Please understand that not all results are received at the same time and often the doctors may need to interpret multiple results in order to provide you with the best plan of care or course of treatment. Therefore, we ask that you please give us  2 business days to thoroughly review all your results before contacting  the office for clarification. Should we see a critical lab result, you will be contacted sooner.     If You Need Anything After Your Visit   If you have any questions or concerns for your doctor, please call our main line at [phone]. If no one answers, please leave a voicemail as directed and we will return your call as soon as possible. Messages left after 4 pm will be answered the following business day.    You may also send us  a message via MyChart. We typically respond to MyChart messages within 1-2 business days.  For prescription refills, please ask your pharmacy to contact our office. Our fax number is [phone].  If you have an urgent issue when the clinic is closed that cannot wait until the next business day, you can page your doctor at the number below.     Please note that while we do our best to be available for urgent issues outside of office hours, we are not available 24/7.    If you have an urgent issue and are unable to reach us , you may choose to seek medical care at your doctor's office, retail clinic, urgent care center, or emergency room.   If you have a medical emergency, please immediately call 911 or go to the emergency department. In the event of inclement weather, please call our main line at [phone] for an update on the status of any delays or closures.  Dermatology Medication Tips: Please keep the boxes that topical medications come in in order to help keep track of the instructions about  where and how to use these. Pharmacies typically print the medication instructions only on the boxes and not directly on the medication tubes.   If your medication is too expensive, please contact our office at [phone] or send us  a message through MyChart.    We are unable to tell what your co-pay for medications will be in advance as this is different depending on your insurance coverage. However, we may be able to find a substitute medication at lower cost or  fill out paperwork to get insurance to cover a needed medication.    If a prior authorization is required to get your medication covered by your insurance company, please allow us  1-2 business days to complete this process.   Drug prices often vary depending on where the prescription is filled and some pharmacies may offer cheaper prices.   The website www.goodrx.com contains coupons for medications through different pharmacies. The prices here do not account for what the cost may be with help from insurance (it may be cheaper with your insurance), but the website can give you the price if you did not use any insurance.  - You can print the associated coupon and take it with your prescription to the pharmacy.  - You may also stop by our office during regular business hours and pick up a GoodRx coupon card.  - If you need your prescription sent electronically to a different pharmacy, notify our office through [facility] or by phone at [phone]

## 2023-12-14 NOTE — Progress Notes (Signed)
 Complex Care Management Note Care Guide Note  12/14/2023 Name: Tamara [name] MRN: [id number] DOB: 10-18-1960   Complex Care Management Outreach Attempts: A second unsuccessful outreach was attempted today to offer the patient with information about available complex care management services.  Follow Up Plan:  Additional outreach attempts will be made to offer the patient complex care management information and services.   Encounter Outcome:  No Answer-No Voicemail  Leotis Rase [facility], [facility] Guide  Direct Dial: [phone]  Fax [phone]

## 2023-12-17 NOTE — Progress Notes (Signed)
 Complex Care Management Note Care Guide Note  12/17/2023 Name: Tamara [name] MRN: [id number] DOB: 09/23/1960   Complex Care Management Outreach Attempts: A third unsuccessful outreach was attempted today to offer the patient with information about available complex care management services.  Follow Up Plan:  Additional outreach attempts will be made to offer the patient complex care management information and services.   Encounter Outcome:  No Answer-No Voicemail  Leotis Rase [facility], [facility] Guide  Direct Dial: [phone]  Fax [phone]

## 2023-12-24 ENCOUNTER — Ambulatory Visit (INDEPENDENT_AMBULATORY_CARE_PROVIDER_SITE_OTHER): Payer: Self-pay | Admitting: Physician Assistant

## 2023-12-24 NOTE — Progress Notes (Deleted)
   SUBJECTIVE: Discussed the use of AI scribe software for clinical note transcription with the patient, who gave verbal consent to proceed.  Chief Complaint: Obesity  Interim History: ***  Tamara [name] is here to discuss her progress with her obesity treatment plan. She is on the {HWW Weight Loss Plan:210964005} and states she {CHL AMB IS/IS NOT:210130109} following her eating plan approximately *** % of the time. She states she {CHL AMB IS/IS NOT:210130109} exercising *** minutes *** times per week.   OBJECTIVE: Visit Diagnoses: Problem List Items Addressed This Visit     Prediabetes - Primary   Edema of both legs   Multiple sclerosis (Chronic)   Vitamin D  deficiency (Chronic)   Other Visit Diagnoses       Dental abscess- affecting her eating         Obesity, starting BMI 33.90           No data recorded No data recorded No data recorded No data recorded   ASSESSMENT AND PLAN:  Diet: Analys {CHL AMB IS/IS NOT:210130109} currently in the action stage of change. As such, her goal is to {HWW Weight Loss Efforts:210964006}. She {HAS HAS WNU:81165} agreed to {HWW Weight Loss Plan:210964005}.  Exercise: Tamara [name] has been instructed {HWW Exercise:210964007} for weight loss and overall health benefits.   Behavior Modification:  We discussed the following Behavioral Modification Strategies today: {HWW Behavior Modification:210964008}. We discussed various medication options to help Tamara [name] with her weight loss efforts and we both agreed to ***.  No follow-ups on file.Tamara [name] She was informed of the importance of frequent follow up visits to maximize her success with intensive lifestyle modifications for her multiple health conditions.  Attestation Statements:   Reviewed by clinician on day of visit: allergies, medications, problem list, medical history, surgical history, family history, social history, and previous encounter notes.   Time spent on visit including pre-visit chart review and  post-visit care and charting was *** minutes.    Tamara Linderman, PA-C

## 2023-12-24 NOTE — Progress Notes (Deleted)
   SUBJECTIVE: Discussed the use of AI scribe software for clinical note transcription with the patient, who gave verbal consent to proceed.  Chief Complaint: Obesity  Interim History: ***  Tamara [name] is here to discuss her progress with her obesity treatment plan. She is on the {HWW Weight Loss Plan:210964005} and states she {CHL AMB IS/IS NOT:210130109} following her eating plan approximately *** % of the time. She states she {CHL AMB IS/IS NOT:210130109} exercising *** minutes *** times per week.   OBJECTIVE: Visit Diagnoses: Problem List Items Addressed This Visit     Prediabetes - Primary   Edema of both legs   Vitamin D  deficiency (Chronic)   Other Visit Diagnoses       Slow transit constipation         Obesity, starting BMI 33.90           No data recorded No data recorded No data recorded No data recorded   ASSESSMENT AND PLAN:  Diet: Tamara [name] {CHL AMB IS/IS NOT:210130109} currently in the action stage of change. As such, her goal is to {HWW Weight Loss Efforts:210964006}. She {HAS HAS WNU:81165} agreed to {HWW Weight Loss Plan:210964005}.  Exercise: Tamara [name] has been instructed {HWW Exercise:210964007} for weight loss and overall health benefits.   Behavior Modification:  We discussed the following Behavioral Modification Strategies today: {HWW Behavior Modification:210964008}. We discussed various medication options to help Tamara [name] with her weight loss efforts and we both agreed to ***.  No follow-ups on file.Tamara [name] She was informed of the importance of frequent follow up visits to maximize her success with intensive lifestyle modifications for her multiple health conditions.  Attestation Statements:   Reviewed by clinician on day of visit: allergies, medications, problem list, medical history, surgical history, family history, social history, and previous encounter notes.   Time spent on visit including pre-visit chart review and post-visit care and charting was ***  minutes.    Tamara Endicott, PA-C

## 2023-12-25 ENCOUNTER — Ambulatory Visit (INDEPENDENT_AMBULATORY_CARE_PROVIDER_SITE_OTHER): Admitting: Physician Assistant

## 2023-12-25 DIAGNOSIS — R7303 Prediabetes: Secondary | ICD-10-CM

## 2023-12-25 DIAGNOSIS — Z6833 Body mass index (BMI) 33.0-33.9, adult: Secondary | ICD-10-CM

## 2023-12-25 DIAGNOSIS — G35D Multiple sclerosis, unspecified: Secondary | ICD-10-CM

## 2023-12-25 DIAGNOSIS — E559 Vitamin D deficiency, unspecified: Secondary | ICD-10-CM

## 2023-12-25 DIAGNOSIS — R600 Localized edema: Secondary | ICD-10-CM

## 2023-12-25 DIAGNOSIS — K047 Periapical abscess without sinus: Secondary | ICD-10-CM

## 2023-12-31 ENCOUNTER — Ambulatory Visit (INDEPENDENT_AMBULATORY_CARE_PROVIDER_SITE_OTHER): Admitting: Family Medicine

## 2024-01-01 ENCOUNTER — Encounter (INDEPENDENT_AMBULATORY_CARE_PROVIDER_SITE_OTHER): Payer: Self-pay | Admitting: Physician Assistant

## 2024-01-01 ENCOUNTER — Ambulatory Visit (INDEPENDENT_AMBULATORY_CARE_PROVIDER_SITE_OTHER): Admitting: Physician Assistant

## 2024-01-01 ENCOUNTER — Ambulatory Visit (HOSPITAL_COMMUNITY)
Admission: RE | Admit: 2024-01-01 | Discharge: 2024-01-01 | Disposition: A | Discharge status: Home / Self Care | Source: Ambulatory Visit | Attending: Neurology | Admitting: Neurology

## 2024-01-01 VITALS — BP 130/81 | HR 69 | Temp 97.8°F | Ht 63.5 in | Wt 175.2 lb

## 2024-01-01 VITALS — BP 110/65 | HR 73 | Temp 97.4°F | Resp 16

## 2024-01-01 DIAGNOSIS — G35D Multiple sclerosis, unspecified: Secondary | ICD-10-CM | POA: Diagnosis present

## 2024-01-01 DIAGNOSIS — Z6833 Body mass index (BMI) 33.0-33.9, adult: Secondary | ICD-10-CM

## 2024-01-01 DIAGNOSIS — R7303 Prediabetes: Secondary | ICD-10-CM | POA: Diagnosis not present

## 2024-01-01 DIAGNOSIS — E559 Vitamin D deficiency, unspecified: Secondary | ICD-10-CM | POA: Diagnosis not present

## 2024-01-01 DIAGNOSIS — E66811 Obesity, class 1: Secondary | ICD-10-CM | POA: Diagnosis not present

## 2024-01-01 DIAGNOSIS — L649 Androgenic alopecia, unspecified: Secondary | ICD-10-CM | POA: Diagnosis not present

## 2024-01-01 DIAGNOSIS — Z6830 Body mass index (BMI) 30.0-30.9, adult: Secondary | ICD-10-CM | POA: Diagnosis not present

## 2024-01-01 MED ORDER — SODIUM CHLORIDE 0.9 % IV SOLN
300.0000 mg | Freq: Once | INTRAVENOUS | Status: AC
  Administered 2024-01-01: 300 mg via INTRAVENOUS
  Filled 2024-01-01: qty 15

## 2024-01-01 MED ORDER — SODIUM CHLORIDE 0.9 % IV SOLN: INTRAVENOUS | Status: AC

## 2024-01-01 MED ORDER — METFORMIN HCL 500 MG PO TABS: 500.0000 mg | ORAL_TABLET | Freq: Two times a day (BID) | ORAL | 1 refills | Status: DC

## 2024-01-01 NOTE — Progress Notes (Signed)
 PATIENT CARE CENTER NOTE     Diagnosis: Multiple Sclerosis ( HCC) [G35]      Provider: Vear Ade, MD     Procedure: Tysabri  300mg  infusion     Note: Patient received Tysabri  infusion (dose # 12 of 12) via PIV. No premeds required per orders. Tolerated infusion well with no adverse reaction. Vital signs stable. Printed AVS offered but patient refused. Infusion ordered for every 28 days and patient will scheduled next appointment at the front desk.  Patient alert, oriented and ambulatory at discharge.

## 2024-01-01 NOTE — Progress Notes (Signed)
 SUBJECTIVE: Discussed the use of AI scribe software for clinical note transcription with the patient, who gave verbal consent to proceed.  Chief Complaint: Obesity  Interim History: She is down 2 lbs since her last visit.  Down 19 lbs overall TBW loss of 9.8%  Tamara [name] is here to discuss her progress with her obesity treatment plan. She is on the Category 1 Plan and states she is following her eating plan approximately 95 % of the time. She states she is exercising using all in one machine 25 minutes 5 times per week. Tamara [name] is a 63 year old female with multiple sclerosis who presents for a follow-up on weight management and medication review.  She has lost 19 pounds, bringing her weight to 175 pounds, due to improved nutrition and regular exercise using an exercise bike. She has resistance bands but does not use them due to difficulty. She has had an increase in muscle mass and a decrease in fat mass from 74.6 pounds to 65 pounds, with a body fat percentage now at 37.2%.  She takes metformin  twice daily, which she believes aids in her weight management. Missing a dose increases her appetite, which she finds beneficial as it encourages regular eating, aiding in weight loss. Her diet includes Healthy Choice dinners and other low-sodium, high-protein meals. She avoids fried foods due to irritable bowel syndrome, which is aggravated by greasy foods. She also takes vitamin D , vitamin C, and other vitamins regularly.  She has a history of multiple sclerosis and is on medications prescribed by her neurologist, including Topamax  and phentermine , which she takes to manage headaches and counteract the stimulating effects of phentermine . She also mentions a recent dental infection which is going to require a root canal.  She lives alone and is concerned about maintaining her weight to ensure mobility and independence, especially given her MS. 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 She remains active around the house and is cautious about her health, particularly during the winter months to avoid illness.  No gastrointestinal upset from metformin . No adverse effects from current medications.  OBJECTIVE: Visit Diagnoses: Problem List Items Addressed This Visit     Prediabetes - Primary   Relevant Medications   metFORMIN  (GLUCOPHAGE ) 500 MG tablet   Vitamin D  deficiency (Chronic)   Other Visit Diagnoses       Androgenic alopecia         Obesity, starting BMI 33.90       Relevant Medications   metFORMIN  (GLUCOPHAGE ) 500 MG tablet     BMI 30.0-30.9,adult Current BMI 30.5         Class 1 obesity Significant weight loss of 19 pounds, current weight 175 pounds. Increased muscle mass and decreased fat mass from 74.6 to 65 pounds. Body fat percentage decreased to 37.2%, goal is 35% or less.  Engaging in regular exercise using an exercise machine regularly.  Consuming healthy meals, avoiding high sodium and fried foods.  Motivated to continue weight loss to improve mobility and reduce fall risk, especially considering living alone and having MS. - Continue current exercise regimen with exercise bike. - Maintain healthy diet with low sodium and no fried foods. - Continue weight loss efforts to achieve body fat percentage goal of 35% or less.  Prediabetes Managed with metformin  500 mg, taken twice daily without gastrointestinal side effects. Reports increased appetite if a dose is missed, leading to weight gain. Metformin   aids in weight management and blood sugar control. Lab Results  Component Value Date   HGBA1C 5.9 (H) 10/23/2022   HGBA1C 5.9 (H) 03/31/2021   Lab Results  Component Value Date   LDLCALC 127 (H) 10/23/2022   CREATININE 0.89 10/23/2022   INSULIN   Date Value Ref Range Status  10/23/2022 5.0 2.6 - 24.9 uIU/mL Final  ]Continue working on nutrition plan to decrease simple carbohydrates,  increase lean proteins and exercise to promote weight loss, improve glycemic control and prevent progression to Type 2 diabetes.   - Continue/refill metformin  500 mg twice daily. Meds ordered this encounter  Medications   metFORMIN  (GLUCOPHAGE ) 500 MG tablet    Sig: Take 1 tablet (500 mg total) by mouth 2 (two) times daily with a meal.    Dispense:  60 tablet    Refill:  1     Vitamin D  deficiency Managed with 2000 units of vitamin D  daily. Importance of maintaining adequate vitamin D  levels for energy and overall health discussed. Last vitamin D  Lab Results  Component Value Date   VD25OH 71.9 10/23/2022   Low vitamin D  levels can be associated with adiposity and may result in leptin resistance and weight gain. Also associated with fatigue.  Currently on vitamin D  supplementation without any adverse effects such as nausea, vomiting or muscle weakness.  - Continue 2000 units of vitamin D  daily.   Androgenic alopecia She is being followed by Dr. Alm- Dermatology and reports starting topical medications- minoxidil 8% and finasteride 0.1% as well as collagen- Vivscal and vital protein to encourage hair growth.   Vitals Temp: 97.8 F (36.6 C) BP: 130/81 Pulse Rate: 69 SpO2: 99 %   Anthropometric Measurements Height: 5' 3.5 (1.613 m) Weight: 175 lb 3.2 oz (79.5 kg) BMI (Calculated): 30.54 Weight at Last Visit: 177 lbs Weight Lost Since Last Visit: 2 lbs Weight Gained Since Last Visit: 0 Starting Weight: 194 lbs Total Weight Loss (lbs): 19 lb (8.618 kg) Peak Weight: 223 lbs   Body Composition  Body Fat %: 37.2 % Fat Mass (lbs): 65.2 lbs Muscle Mass (lbs): 104.4 lbs Total Body Water (lbs): 78 lbs Visceral Fat Rating : 10   Other Clinical Data Fasting: no Labs: no Today's Visit #: 12 Starting Date: 10/23/22     ASSESSMENT AND PLAN:  Diet: Tamara [name] is currently in the action stage of change. As such, her goal is to continue with weight loss efforts. She has  agreed to Category 1 Plan.  Exercise: Tamara [name] has been instructed to work up to a goal of 150 minutes of combined cardio and strengthening exercise per week and to continue exercising as is for weight loss and overall health benefits.   Behavior Modification:  We discussed the following Behavioral Modification Strategies today: increasing lean protein intake, decreasing simple carbohydrates, increasing vegetables, increase H2O intake, increase high fiber foods, meal planning and cooking strategies, holiday eating strategies, avoiding temptations, and planning for success. We discussed various medication options to help Tamara [name] with her weight loss efforts and we both agreed to continue metformin  500 mg twice daily for primary indication of prediabetes- off label use.  Return in about 4 weeks (around 01/29/2024).Tamara [name] She was informed of the importance of frequent follow up visits to maximize her success with intensive lifestyle modifications for her multiple health conditions.  Attestation Statements:   Reviewed by clinician on day of visit: allergies, medications, problem list, medical history, surgical history, family history, social history, and previous encounter notes.   Time  spent on visit including pre-visit chart review and post-visit care and charting was 31 minutes.    Bekki Tavenner, PA-C

## 2024-01-07 ENCOUNTER — Ambulatory Visit: Admitting: Internal Medicine

## 2024-01-10 ENCOUNTER — Ambulatory Visit: Admitting: Neurology

## 2024-01-10 ENCOUNTER — Telehealth: Payer: Self-pay | Admitting: Neurology

## 2024-01-10 ENCOUNTER — Encounter: Payer: Self-pay | Admitting: Neurology

## 2024-01-10 VITALS — BP 126/77 | HR 86 | Resp 17

## 2024-01-10 DIAGNOSIS — R208 Other disturbances of skin sensation: Secondary | ICD-10-CM | POA: Diagnosis not present

## 2024-01-10 DIAGNOSIS — R269 Unspecified abnormalities of gait and mobility: Secondary | ICD-10-CM

## 2024-01-10 DIAGNOSIS — R200 Anesthesia of skin: Secondary | ICD-10-CM

## 2024-01-10 DIAGNOSIS — Z79899 Other long term (current) drug therapy: Secondary | ICD-10-CM

## 2024-01-10 DIAGNOSIS — G35A Relapsing-remitting multiple sclerosis: Secondary | ICD-10-CM | POA: Diagnosis not present

## 2024-01-10 MED ORDER — PHENTERMINE HCL 37.5 MG PO CAPS: 37.5000 mg | ORAL_CAPSULE | Freq: Every morning | ORAL | 5 refills | Status: AC

## 2024-01-10 NOTE — Telephone Encounter (Signed)
MRI orders sent to [facility] [phone]

## 2024-01-10 NOTE — Progress Notes (Signed)
 [facility]  PATIENT: Tamara [name] DOB: 1960-10-08  REFERRING DOCTOR OR PCP:  Brad Revering SOURCE: Patient, notes from emergency room and recent hospital stay ([facility]), imaging and lab reports, MRI images on PACS.  _________________________________   HISTORICAL  CHIEF COMPLAINT:  Chief Complaint  Patient presents with   Multiple Sclerosis    Rm10, alone, Ms follow up    HISTORY OF PRESENT ILLNESS:  Tamara [name] Is a 63 y.o. woman with relapsing remitting multiple sclerosis in May 2018 after she presented with transverse myelitis.   Update 01/10/2024 She had her last Tysabri  infusion 01/01/2024.   Last JCV in 01/2023 was negative at 0.21.   She tolerates it well.   She has no exacerbation.   She has a reduced gait and balance - same as earlier this year.   She has mild left leg weakness.  She has some stumbles but no falls.  She has painful tingling/electric in her  left  arm and both hands.    She could not tolerate gabapentin, Lyrica, imipramine , lamotrigine  and oxcarbazepine  and carbamazapine in the past Lacosamide  did  not help.    NCV/EMG was fine in the past so sensory symptoms likely from spinal cord plaques.  Wearing gloves helps her some.   She feels she does worse when she eats red meat or a lot of sugar.     She  lost 20 pounds (197 to 177) this year.   Metformin /phentermine  may have helped some.   She feels spasms improves with omega 3 and Vit D.  She stopped tizanidine  since it made her sleepy  She is reporting only a couple migraine HA/month.  She is on topiramate  with Tylenol  for headaches with benefit.    She has no more episodes of LOC or presyncope.   EEG was normal 07/2022.    She has fatigue.  She sleeps poorly at night.  She still takes phentermine  in the morning which has helped her fatigue as well as reduce appetite  She has had a vit D deficiency and takes D3 - dose recently reduced as Vit D was high normal  She feels mood is doing well.  Her  husband passed away March 26, 2021 and mood was worse for a while.  Prefers not to take an antidepreesant.    MS History:   On 06/10/16, she had the onset of numbness and clumsiness in her legs.   As the day went on, her hands also became numb and clumsy.  She felt numb from her waist down.    She went to the [facility] and had an xtay of her back and was referred to orthopedics     Two days later, she went to the ED when her symptoms worsened and she was seen and discharged.    She saw her internist who ordered a lumbar MRI.  The lumbar MRI showed degenerative changes that were mild at L4-L5 and the study was otherwise fairly normal.   She was referred to the ED and had an MRI of the brain, cervical spine and thoracic spine.    She was found to have a transverse myelitis in the cervical spine with an enhancing lesion in the posterior columns. There was also a smaller non-enhancing focus at C5   She also had 5-6 spots in her brain, the largest being periventricular on the right.  She was admitted to [facility] and had 3 days of IV Solumedrol followed by an oral taper.  She was transferred to rehabilitation and was discharged 5/30.   She walked out and felt much better.   Additionally, while she was in the hospital (06/15/2016) she had a lumbar puncture. The CSF was abnormal showing showing the presence  4 oligoclonal bands and an elevated IgG index of 0.9 (less than 0.7 is normal). The myelin basic protein was mildly elevated at 2.2.      She had another exacerbation after returning home with left leg dragging and feeling more off balance with stumbling.   In retrospect, in 2017, she had an episode lasting a few weeks where she felt she was dragging her left leg some. This completely resolved and she did not have any other symptoms such as numbness at that time.   MRIs of the brain, cervical spine, thoracic spine and lumbar spine were personally reviewed. The MRI of the cervical spine shows an enhancing  lesion posterior spinal cord adjacent to C2-C3. Additionally there is a nonenhancing focus adjacent to C5. In the brain, there are several T2/FLAIR hyperintense foci and the largest is in the right parietal lobe and there are 4 or 5 other small foci, one in the periventricular white matter of the right frontal lobe and the rest in the subcortical white matter.    Laboratory studies show 4 oligoclonal bands and an elevated IgG index of 0.9. Myelin basic protein was mildly elevated at 2.2. She had elevated glucose between 120 and 140 several times while getting steroids.   Her Vit D was mildly low (25.6) and she just started OTC supplementation.   She started Tysabri  in July 2018  IMAGING  MRIs of the brain and cervical spine 07/16/2019 compared to the 2019 brain and 2018 cervical spine .  There are no new lesions. She has old cervical spine lesions at C3 and C5.  The brain shows multiple chronic foci in the hemispheres.  None in brainstem or cerebellum.  No focus enhances.    MRI cervical spie 09/24/2020 showed Stable chronic demyelinating plaques at C3 and C5 levels  MRI brain 07/12/2022 showed Several T2/FLAIR hyperintense foci in the cerebral hemispheres.  There overall pattern is nonspecific.  As she also has probable demyelinating plaque in the cervical spine, these could be consistent with demyelination.  They could also be due to chronic microvascular ischemic change.  None of the foci appear to be acute.  They do not enhance.  Compared to the MRI from 2021, there are no symptoms.     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  It appears stable compared to older MRI     REVIEW OF SYSTEMS: Constitutional: No fevers, chills, sweats, or change in appetite Eyes: No visual changes, double vision, eye pain Ear, nose and throat: No hearing loss, ear pain, nasal congestion, sore throat Cardiovascular: No chest pain, palpitations Respiratory:  No  shortness of breath at rest or with exertion.   No wheezes GastrointestinaI: No nausea, vomiting, diarrhea, abdominal pain, fecal incontinence Genitourinary:  No dysuria, urinary retention or frequency.  No nocturia. Musculoskeletal:  No neck pain, back pain Integumentary: No rash, pruritus, skin lesions Neurological: as above Psychiatric: No depression at this time.  No anxiety Endocrine: No palpitations, diaphoresis, change in appetite, change in weigh or increased thirst Hematologic/Lymphatic:  No anemia, purpura, petechiae. Allergic/Immunologic: No itchy/runny eyes, nasal congestion, recent allergic reactions, rashes  ALLERGIES: Allergies  Allergen Reactions   Codeine  Other (See Comments)  Delusions   Dilaudid [Hydromorphone Hcl] Swelling and Other (See Comments)    Tongue swells    Ditropan  [Oxybutynin ] Other (See Comments)    Burning sensation   Other Swelling and Other (See Comments)    Unnamed gel or antiseptic solution- Was applied to IV site with a needle- Turned the skin black and blue that remained (caused burning and phlebitis, also)   Paxil [Paroxetine Hcl] Other (See Comments)    Hallucinations and heavy periods    Tegretol  [Carbamazepine ] Itching   Trileptal  [Oxcarbazepine ] Swelling   Adhesive [Tape] Hives, Itching, Rash and Other (See Comments)    PAPER TAPE   Augmentin [Amoxicillin -Pot Clavulanate] Rash   Gabapentin Rash and Other (See Comments)    Hallucinations and depression, also    HOME MEDICATIONS:  Current Outpatient Medications:    acetaminophen  (TYLENOL ) 650 MG CR tablet, Take 1,300 mg by mouth 3 (three) times daily as needed for pain., Disp: , Rfl:    amoxicillin  (AMOXIL ) 500 MG tablet, Take 1 tablet (500 mg total) by mouth 3 (three) times daily., Disp: 21 tablet, Rfl: 0   ascorbic acid (VITAMIN C) 500 MG tablet, Take 1,500 mg by mouth daily., Disp: , Rfl:    bacitracin  ointment, Apply 1 Application topically 2 (two) times daily., Disp: 120 g,  Rfl: 0   Cholecalciferol  (VITAMIN D3) 50 MCG (2000 UT) TABS, Take 5 tablets by mouth daily., Disp: , Rfl:    ciclopirox  (PENLAC ) 8 % solution, Apply topically at bedtime. Apply over nail and surrounding skin. Apply daily over previous coat. After seven (7) days, may remove with alcohol and continue cycle., Disp: 6.6 mL, Rfl: 2   Coenzyme Q10-Fish Oil-Vit E (CO-Q 10 OMEGA-3 FISH OIL PO), Take 1 capsule by mouth daily., Disp: , Rfl:    COLLAGEN PO, Take 1 Dose by mouth every other day. Powder; alternate with pill form., Disp: , Rfl:    COLLAGEN PO, Take 3 tablets by mouth every other day. Pill; alternate with powder form., Disp: , Rfl:    Ferrous Sulfate (IRON PO), Take by mouth., Disp: , Rfl:    fluticasone  (FLONASE ) 50 MCG/ACT nasal spray, Use 2 spray(s) in each nostril once daily, Disp: 16 g, Rfl: 0   ibuprofen  (ADVIL ) 200 MG tablet, Take 200 mg by mouth every 6 (six) hours as needed., Disp: , Rfl:    loratadine (CLARITIN) 10 MG tablet, Take 10 mg by mouth daily as needed for allergies or rhinitis., Disp: , Rfl:    magnesium citrate SOLN, Take 1 Bottle by mouth once., Disp: , Rfl:    metFORMIN  (GLUCOPHAGE ) 500 MG tablet, Take 1 tablet (500 mg total) by mouth 2 (two) times daily with a meal., Disp: 60 tablet, Rfl: 1   natalizumab  (TYSABRI ) 300 MG/15ML injection, Inject 300 mg into the vein every 28 (twenty-eight) days., Disp: , Rfl:    pantoprazole  (PROTONIX ) 40 MG tablet, Take 1 tablet (40 mg total) by mouth daily., Disp: 90 tablet, Rfl: 0   phentermine  37.5 MG capsule, Take 1 capsule (37.5 mg total) by mouth every morning., Disp: 30 capsule, Rfl: 5   POTASSIUM BICARBONATE PO, Take by mouth., Disp: , Rfl:    Safety Seal Miscellaneous MISC, Apply 1 Application topically in the morning. Medication Name: Hormonic Hair Solution (Minoxidil 8%, Finasteride 0.1%), Disp: 30 mL, Rfl: 6   Thiamine HCl (VITAMIN B-1 PO), Take by mouth., Disp: , Rfl:    topiramate  (TOPAMAX ) 50 MG tablet, Take 1 tablet (50 mg  total) by mouth  daily., Disp: 90 tablet, Rfl: 3   VITAMIN D  PO, Take 5,000 Units by mouth daily., Disp: , Rfl:    Zinc  50 MG TABS, Take by mouth., Disp: , Rfl:   PAST MEDICAL HISTORY: Past Medical History:  Diagnosis Date   Allergy    Anemia    Bowel obstruction (HCC)    CKD (chronic kidney disease), stage II    based on labs   GERD (gastroesophageal reflux disease)    IBS (irritable bowel syndrome)    at age of 55   Multiple sclerosis    Multiple sclerosis 2018   Obesity    RBBB    Sciatica 2009   Vision abnormalities     PAST SURGICAL HISTORY: Past Surgical History:  Procedure Laterality Date   ABDOMINAL HYSTERECTOMY  01/30/2006   cervix and right ovary still intact   DENTAL SURGERY Right 12/2022   KNEE ARTHROSCOPY  2010 and 2011   Left knee, x2    FAMILY HISTORY: Family History  Problem Relation Age of Onset   Cancer Father        Gallbladder   Gallbladder disease Father    Hypertension Brother    Gallbladder disease Paternal Grandmother    Colon cancer Neg Hx    Colon polyps Neg Hx    Esophageal cancer Neg Hx    Kidney disease Neg Hx     SOCIAL HISTORY:  Social History   Socioeconomic History   Marital status: Widowed    Spouse name: Not on file   Number of children: 2   Years of education: Not on file   Highest education level: Not on file  Occupational History   Occupation: Science Writer  Tobacco Use   Smoking status: Never    Passive exposure: Never   Smokeless tobacco: Never  Vaping Use   Vaping status: Never Used  Substance and Sexual Activity   Alcohol use: No    Alcohol/week: 0.0 standard drinks of alcohol   Drug use: No   Sexual activity: Not on file  Other Topics Concern   Not on file  Social History Narrative   Not on file   Social Drivers of Health   Tobacco Use: Low Risk (01/10/2024)   Patient History    Smoking Tobacco Use: Never    Smokeless Tobacco Use: Never    Passive Exposure: Never  Financial Resource  Strain: Not on file  Food Insecurity: No Food Insecurity (11/28/2023)   Epic    Worried About Programme Researcher, Broadcasting/film/video in the Last Year: Never true    Ran Out of Food in the Last Year: Never true  Transportation Needs: No Transportation Needs (01/16/2022)   PRAPARE - Administrator, Civil Service (Medical): No    Lack of Transportation (Non-Medical): No  Physical Activity: Insufficiently Active (01/16/2022)   Exercise Vital Sign    Days of Exercise per Week: 7 days    Minutes of Exercise per Session: 20 min  Stress: Not on file  Social Connections: Not on file  Intimate Partner Violence: Not on file  Depression (PHQ2-9): Low Risk (11/28/2023)   Depression (PHQ2-9)    PHQ-2 Score: 0  Alcohol Screen: Not on file  Housing: Low Risk (03/21/2022)   Housing    Last Housing Risk Score: 0  Utilities: Not At Risk (03/21/2022)   AHC Utilities    Threatened with loss of utilities: No  Health Literacy: Not on file     PHYSICAL EXAM  Vitals:  01/10/24 0842 01/10/24 0849  BP: (!) 126/57 126/77  Pulse: 86   Resp: 17   SpO2: 98%        There is no height or weight on file to calculate BMI.   General: The patient is well-developed and well-nourished and in no acute distress   has 2+ edema in feet/ankles   Neurologic Exam  Mental status: The patient is alert and oriented x 3 at the time of the examination. The patient has apparent normal recent and remote memory, with an apparently normal attention span and concentration ability.   Speech is normal.  Cranial nerves: Extraocular movements are full. Facial strength and sensation is normal. Trapezius strength is normal.. Hearing appears to be symmetric.  Motor:  Muscle bulk is normal.   Muscle tone is normal. Strength is 5/5 except 4+/5 hip flexion (iliopsoas).    RAM was symmetric in hands though  Sensory:   She had normal sensation to vibration in the arms and legs.  She reported some altered sensation in the left hand  compared to the right.    Coordination: Cerebellar testing reveals good finger-nose-finger and mild reduced left heel-to-shin bilaterally.  Gait and station: Station is normal.  Gait is wide.  Tandem is poor.   She has a mild left foot drop the Romberg is negative.  Reflexes:  . Deep tendon reflexes are 3 and symmetric in the knees and ankles and 2 and symmetric in the arms.  No ankle clonus.     DIAGNOSTIC DATA (LABS, IMAGING, TESTING) - I reviewed patient records, labs, notes, testing and imaging myself where available.  Lab Results  Component Value Date   WBC 6.0 08/20/2023   HGB 13.0 08/20/2023   HCT 40.7 08/20/2023   MCV 89 08/20/2023   PLT 183 08/20/2023      Component Value Date/Time   NA 145 (H) 10/23/2022 1216   K 4.2 10/23/2022 1216   CL 110 (H) 10/23/2022 1216   CO2 23 10/23/2022 1216   GLUCOSE 87 10/23/2022 1216   GLUCOSE 94 09/07/2020 1405   BUN 16 10/23/2022 1216   CREATININE 0.89 10/23/2022 1216   CREATININE 1.01 07/08/2013 1440   CALCIUM 9.2 10/23/2022 1216   PROT 6.1 10/23/2022 1216   ALBUMIN 4.3 10/23/2022 1216   AST 13 10/23/2022 1216   ALT 12 10/23/2022 1216   ALKPHOS 75 10/23/2022 1216   BILITOT 0.2 10/23/2022 1216   GFRNONAA >60 09/07/2020 1405   GFRNONAA 64 07/08/2013 1440   GFRAA >60 07/10/2019 0519   GFRAA 74 07/08/2013 1440   Lab Results  Component Value Date   CHOL 202 (H) 10/23/2022   HDL 65 10/23/2022   LDLCALC 127 (H) 10/23/2022   TRIG 56 10/23/2022   CHOLHDL 3.0 03/31/2021   Lab Results  Component Value Date   HGBA1C 5.9 (H) 10/23/2022   Lab Results  Component Value Date   VITAMINB12 >2000 (H) 10/23/2022   Lab Results  Component Value Date   TSH 2.190 10/23/2022       ASSESSMENT AND PLAN  Multiple sclerosis, relapsing-remitting  Gait disturbance  Dysesthesia  Bilateral hand numbness  High risk medication use  1.   Continue Tysabri ..  Her MS has been stable.   No exacerbations.   Recheck JCV Ab today and  about every 6 months.  2.   She will continue phentermine  for MS fatigue and Topamax  for migraine.    Phentermine  to prevent weight gain and help MS fatigue 3.  Due to combination of physical and cognitive/fatigue issues, she is disabled and unable to work.  4.   Continue exercise and stretch.   Stretch in am to help leg tightness 5.   Continue Vit D supplementation.    6.   Rtc 6 months    Juelz Whittenberg A. Vear, MD, PhD, DIEDRA  01/10/2024, 8:59 AM Certified in Neurology, Clinical Neurophysiology, Sleep Medicine, Pain Medicine and Neuroimaging Dir., [facility]  [facility] [address] [place], [place] [address] [phone]

## 2024-01-10 NOTE — Telephone Encounter (Signed)
 Patient called to make aware having difficulty dressing this morning, and running behind and may be a little late. Informed patient appointment is at 8:30 am. Patient said I will be able to make that time. Informed patient may have to reschedule if get here after 8:30 am

## 2024-01-16 ENCOUNTER — Telehealth (INDEPENDENT_AMBULATORY_CARE_PROVIDER_SITE_OTHER): Payer: Self-pay | Admitting: Family Medicine

## 2024-01-16 NOTE — Telephone Encounter (Signed)
 Appt tomorrow

## 2024-01-16 NOTE — Telephone Encounter (Signed)
 Patient is having issues with her prescription of 1000mg  Metfomin. She said her weight has changed (174 to 187 nshe did not disclose in what amount of time) and she thinks she's experiencing it because its counteracting with another medication that she's on. She also said she is experiencing swelling but did not specify where or the severity.  She has not seen her PCP, gone to the ED or Urgent care, nor has she called her pharmacist to inquire about potential counteractions or address the swelling. She would like an opportunity to talk directly to a provider.

## 2024-01-16 NOTE — Telephone Encounter (Signed)
 Saw Tamara [name] 01/01/24

## 2024-01-17 ENCOUNTER — Ambulatory Visit (INDEPENDENT_AMBULATORY_CARE_PROVIDER_SITE_OTHER): Admitting: Adult Health

## 2024-01-17 ENCOUNTER — Telehealth (INDEPENDENT_AMBULATORY_CARE_PROVIDER_SITE_OTHER): Payer: Self-pay

## 2024-01-17 ENCOUNTER — Ambulatory Visit: Payer: Self-pay | Admitting: Neurology

## 2024-01-17 LAB — CBC WITH DIFFERENTIAL/PLATELET
Basophils Absolute: 0 x10E3/uL (ref 0.0–0.2)
Basos: 0 %
EOS (ABSOLUTE): 0 x10E3/uL (ref 0.0–0.4)
Eos: 1 %
Hematocrit: 34.8 % (ref 34.0–46.6)
Hemoglobin: 10.8 g/dL — ABNORMAL LOW (ref 11.1–15.9)
Immature Grans (Abs): 0.1 x10E3/uL (ref 0.0–0.1)
Immature Granulocytes: 1 %
Lymphocytes Absolute: 2.7 x10E3/uL (ref 0.7–3.1)
Lymphs: 41 %
MCH: 28.3 pg (ref 26.6–33.0)
MCHC: 31 g/dL — ABNORMAL LOW (ref 31.5–35.7)
MCV: 91 fL (ref 79–97)
Monocytes Absolute: 0.6 x10E3/uL (ref 0.1–0.9)
Monocytes: 8 %
NRBC: 1 % — ABNORMAL HIGH (ref 0–0)
Neutrophils Absolute: 3.2 x10E3/uL (ref 1.4–7.0)
Neutrophils: 49 %
Platelets: 163 x10E3/uL (ref 150–450)
RBC: 3.82 x10E6/uL (ref 3.77–5.28)
RDW: 14.7 % (ref 11.7–15.4)
WBC: 6.6 x10E3/uL (ref 3.4–10.8)

## 2024-01-17 LAB — STRATIFY JCV(TM) AB W/INDEX: JCV Index Value: 0.11

## 2024-01-17 NOTE — Telephone Encounter (Signed)
 Pt came in to office not feeling well. Pt was told on multiple occasions to go by EMS. Provider assessed pt and determined nothing critical. Pt is leaving on her own accord and has refused all EMS care.

## 2024-01-29 ENCOUNTER — Other Ambulatory Visit: Payer: Self-pay | Admitting: *Deleted

## 2024-01-29 ENCOUNTER — Other Ambulatory Visit (HOSPITAL_COMMUNITY): Payer: Self-pay | Admitting: Neurology

## 2024-01-29 ENCOUNTER — Ambulatory Visit (HOSPITAL_COMMUNITY)
Admission: RE | Admit: 2024-01-29 | Discharge: 2024-01-29 | Disposition: A | Discharge status: Home / Self Care | Source: Ambulatory Visit | Attending: Internal Medicine | Admitting: Internal Medicine

## 2024-01-29 ENCOUNTER — Telehealth: Payer: Self-pay

## 2024-01-29 VITALS — BP 124/77 | HR 66 | Temp 97.1°F | Resp 16

## 2024-01-29 DIAGNOSIS — G35D Multiple sclerosis, unspecified: Secondary | ICD-10-CM

## 2024-01-29 MED ORDER — PANTOPRAZOLE SODIUM 40 MG PO TBEC: 40.0000 mg | DELAYED_RELEASE_TABLET | Freq: Every day | ORAL | 0 refills | Status: AC

## 2024-01-29 MED ORDER — SODIUM CHLORIDE 0.9 % IV SOLN
300.0000 mg | Freq: Once | INTRAVENOUS | Status: AC
  Administered 2024-01-29: 300 mg via INTRAVENOUS
  Filled 2024-01-29: qty 15

## 2024-01-29 MED ORDER — SODIUM CHLORIDE 0.9 % IV SOLN: INTRAVENOUS | Status: DC

## 2024-01-29 NOTE — Telephone Encounter (Signed)
 SABRA

## 2024-01-29 NOTE — Progress Notes (Signed)
"   PATIENT CARE CENTER NOTE     Diagnosis: Multiple Sclerosis ( HCC) [G35]      Provider: Vear Ade, MD     Procedure: Tysabri  300mg  infusion     Note: Patient received Tysabri  infusion via PIV. No premeds required per orders. Tolerated infusion well with no adverse reaction. Vital signs stable. Printed AVS offered but patient refused. Infusion ordered for every 28 days and patient will scheduled next appointment at the front desk.  Patient alert, oriented and ambulatory at discharge. "

## 2024-01-30 ENCOUNTER — Telehealth (HOSPITAL_COMMUNITY): Payer: Self-pay

## 2024-01-30 NOTE — Telephone Encounter (Signed)
 Auth Submission: NO AUTH NEEDED Site of care: Site of care: [facility] Payer: [employer] Medication & CPT/J Code(s) submitted: Tysabri  (Natalizumab ) [id number] Diagnosis Code: G35.D Route of submission (phone, fax, portal):  Phone # Fax # Auth type: Buy/Bill HB Units/visits requested: 300mg  q4weeks Reference number:  Approval from: 01/31/24 to 09/29/24

## 2024-01-31 ENCOUNTER — Encounter (HOSPITAL_COMMUNITY): Payer: Self-pay | Admitting: Neurology

## 2024-02-01 ENCOUNTER — Other Ambulatory Visit

## 2024-02-01 ENCOUNTER — Inpatient Hospital Stay: Admission: RE | Admit: 2024-02-01 | Source: Ambulatory Visit

## 2024-02-05 ENCOUNTER — Encounter (INDEPENDENT_AMBULATORY_CARE_PROVIDER_SITE_OTHER): Payer: Self-pay | Admitting: Family Medicine

## 2024-02-05 ENCOUNTER — Ambulatory Visit (INDEPENDENT_AMBULATORY_CARE_PROVIDER_SITE_OTHER): Admitting: Family Medicine

## 2024-02-05 VITALS — BP 120/67 | HR 69 | Temp 98.1°F | Ht 63.5 in | Wt 184.0 lb

## 2024-02-05 DIAGNOSIS — R7303 Prediabetes: Secondary | ICD-10-CM

## 2024-02-05 DIAGNOSIS — Z6832 Body mass index (BMI) 32.0-32.9, adult: Secondary | ICD-10-CM

## 2024-02-05 DIAGNOSIS — Z6830 Body mass index (BMI) 30.0-30.9, adult: Secondary | ICD-10-CM

## 2024-02-05 DIAGNOSIS — E559 Vitamin D deficiency, unspecified: Secondary | ICD-10-CM

## 2024-02-05 DIAGNOSIS — Z6833 Body mass index (BMI) 33.0-33.9, adult: Secondary | ICD-10-CM

## 2024-02-05 DIAGNOSIS — E669 Obesity, unspecified: Secondary | ICD-10-CM

## 2024-02-05 MED ORDER — METFORMIN HCL 500 MG PO TABS: 500.0000 mg | ORAL_TABLET | Freq: Two times a day (BID) | ORAL | 1 refills | Status: AC

## 2024-02-05 NOTE — Progress Notes (Signed)
 "  Barnie DOROTHA Jenkins, D.O.  ABFM, ABOM Specializing in Clinical Bariatric Medicine  Office located at: [address] [place], [place]  [address]    A) FOR THE CHRONIC DISEASE OF OBESITY:  Chief complaint: Obesity Tamara [name] is here to discuss her progress with her obesity treatment plan.   History of present illness / Interval history:  Tamara [name] is here today for her follow-up office visit.  Since last OV on 01/01/24, pt is up 9 lbs. Patient states that she is skipping meals because she does not cook but is eating Vital dinner and drinking protein drinks (muscle milk). She reports eating off plan for the holidays.    01/01/24 02/05/24 15:00   Body Fat % 37.2 % 41.8 %  Muscle Mass (lbs) 104.4 lbs 102 lbs  Fat Mass (lbs) 65.2 lbs 77.2 lbs  Total Body Water (lbs) 78 lbs 87.4 lbs  Visceral Fat Rating  10 12   Counseling done on how various foods will affect these numbers and how to maximize success.  Total lbs lost to date: - 10 lbs Total Fat Mass in lbs lost to date: - 6 lbs Total weight loss percentage to date: - 5.15 %    Obesity, starting BMI 33.90 BMI 30.0-30.9,adult Current BMI 32.08  Nutrition Therapy She is on the Category 1 Plan and states she is following her eating plan approximately 85 % of the time.   - Tracking Calories/Macros: no   - Eating More Whole Foods: yes  - Adequate Protein Intake: yes  - Adequate Water Intake: no  - Skipping Meals: yes  - Sleeping 7-9 Hours/ Night: no    Tamara [name] is currently in the action stage of change. As such, her goal is to continue weight management plan.  She has agreed to: continue current plan with option to journal dinner 400-500 calories with 38+ grams of protein    Physical Activity Tamara [name] is doing the whole body exercise bike 10  minutes 5 to 7 days per week   Tamara [name] has been advised to work up to 300-450 minutes of moderate intensity aerobic activity a week and strengthening exercises 2-3 times per week for  cardiovascular health, weight loss maintenance and preservation of muscle mass.  She has agreed to : Think about enjoyable ways to increase daily physical activity and overcoming barriers to exercise and Combine aerobic and strengthening exercises for efficiency and improved cardiometabolic health.   Behavioral Modifications Evidence-based interventions for health behavior change were utilized today including the discussion of   1) SMART goals for next OV:    - Follow meal plan   Regarding patient's less desirable eating habits and patterns, we employed the technique of small changes.   We discussed the following today: discussed pre-packaged healthier meals such as [employer] All Natural, Whole 30 chicken meals, Longlife meal prep and Factor Meals etc Additional resources provided today: Handout on CAT 1 meal plan    Medical Interventions/ Pharmacotherapy Previous Bariatric surgery: n/a Pharmacotherapy for weight loss: She is currently taking Metformin  500 mg BID for medical weight loss.    We discussed various medication options to help Tamara [name] with her weight loss efforts and we both agreed to : Continue with current nutritional and behavioral strategies and Adequate clinical response to anti-obesity medication, continue current anti-obesity regimen   B) OBESITY RELATED CONDITIONS ADDRESSED TODAY:  Prediabetes Assessment & Plan Lab Results  Component Value Date   HGBA1C 5.9 (H) 10/23/2022   HGBA1C 5.9 (H) 03/31/2021  INSULIN  5.0 10/23/2022    On Metformin  500 mg BID. Patient states that she has no cravings and she has to remind herself to eat because it is not a priority in her head and tend to forget. She reports that she was started on the Metformin  to make her want to eat and it makes her crave water. She states that the Metformin  sometimes makes her crave or entices her to eat but she  fights the urge to eat. This has been happening recently because she has not been taking the  Metformin  consistently. Stressed to patient the importance of taking the medication on a regular basis to help her stop the progression to DM. Will refill medication today. Continue following prudent meal plan and decreasing simple carbs and sugars.     Vitamin D  deficiency Assessment & Plan Lab Results  Component Value Date   VD25OH 71.9 10/23/2022   VD25OH 63.1 11/17/2021   VD25OH 60.3 03/31/2021   Taking OTC Vit D 5000 units daily. With reported good compliance and tolerance. Patient states that she has no issue remembering to take the supplementation. Last labs show that Vit D levels are at goal. Continue with supplementation will re obtain labs as medically necessary.    Medications Discontinued During This Encounter  Medication Reason   metFORMIN  (GLUCOPHAGE ) 500 MG tablet Reorder     Meds ordered this encounter  Medications   metFORMIN  (GLUCOPHAGE ) 500 MG tablet    Sig: Take 1 tablet (500 mg total) by mouth 2 (two) times daily with a meal.    Dispense:  60 tablet    Refill:  1     Follow up:   Return 03/12/2024 at 3:30 PM  She was informed of the importance of frequent follow up visits to maximize her success with intensive lifestyle modifications for her multiple health conditions.   Weight Summary and Biometrics   Weight Lost Since Last Visit: 0lb  Weight Gained Since Last Visit: 9lb   Vitals Temp: 98.1 F (36.7 C) BP: 120/67 Pulse Rate: 69 SpO2: 98 %   Anthropometric Measurements Height: 5' 3.5 (1.613 m) Weight: 184 lb (83.5 kg) BMI (Calculated): 32.08 Weight at Last Visit: 175lb Weight Lost Since Last Visit: 0lb Weight Gained Since Last Visit: 9lb Starting Weight: 194lb Total Weight Loss (lbs): 10 lb (4.536 kg) Peak Weight: 223lb   Body Composition  Body Fat %: 41.8 % Fat Mass (lbs): 77.2 lbs Muscle Mass (lbs): 102 lbs Total Body Water (lbs): 87.4 lbs Visceral Fat Rating : 12   Other Clinical Data Fasting: no Labs: no Today's Visit #:  13 Starting Date: 10/23/22    Objective:   PHYSICAL EXAM: Blood pressure 120/67, pulse 69, temperature 98.1 F (36.7 C), height 5' 3.5 (1.613 m), weight 184 lb (83.5 kg), SpO2 98%. Body mass index is 32.08 kg/m.  General: she is overweight, cooperative and in no acute distress. PSYCH: Has normal mood, affect and thought process.   HEENT: EOMI, sclerae are anicteric. Lungs: Normal breathing effort, no conversational dyspnea. Extremities: Moves * 4 Neurologic: A and O * 3, good insight  DIAGNOSTIC DATA REVIEWED: BMET    Component Value Date/Time   NA 145 (H) 10/23/2022 1216   K 4.2 10/23/2022 1216   CL 110 (H) 10/23/2022 1216   CO2 23 10/23/2022 1216   GLUCOSE 87 10/23/2022 1216   GLUCOSE 94 09/07/2020 1405   BUN 16 10/23/2022 1216   CREATININE 0.89 10/23/2022 1216   CREATININE 1.01 07/08/2013 1440  CALCIUM 9.2 10/23/2022 1216   GFRNONAA >60 09/07/2020 1405   GFRNONAA 64 07/08/2013 1440   GFRAA >60 07/10/2019 0519   GFRAA 74 07/08/2013 1440   Lab Results  Component Value Date   HGBA1C 5.9 (H) 10/23/2022   HGBA1C 5.9 (H) 03/31/2021   Lab Results  Component Value Date   INSULIN  5.0 10/23/2022   Lab Results  Component Value Date   TSH 2.190 10/23/2022   CBC    Component Value Date/Time   WBC 6.6 01/10/2024 0913   WBC 7.0 09/07/2020 1405   RBC 3.82 01/10/2024 0913   RBC 4.00 09/07/2020 1405   HGB 10.8 (L) 01/10/2024 0913   HCT 34.8 01/10/2024 0913   PLT 163 01/10/2024 0913   MCV 91 01/10/2024 0913   MCH 28.3 01/10/2024 0913   MCH 28.5 09/07/2020 1405   MCHC 31.0 (L) 01/10/2024 0913   MCHC 31.6 09/07/2020 1405   RDW 14.7 01/10/2024 0913   Iron Studies    Component Value Date/Time   IRON 65 07/21/2013 1519   TIBC 290 07/21/2013 1519   FERRITIN 69 07/21/2013 1519   IRONPCTSAT 22 07/21/2013 1519   Lipid Panel     Component Value Date/Time   CHOL 202 (H) 10/23/2022 1216   TRIG 56 10/23/2022 1216   HDL 65 10/23/2022 1216   CHOLHDL 3.0  03/31/2021 1058   LDLCALC 127 (H) 10/23/2022 1216   Hepatic Function Panel     Component Value Date/Time   PROT 6.1 10/23/2022 1216   ALBUMIN 4.3 10/23/2022 1216   AST 13 10/23/2022 1216   ALT 12 10/23/2022 1216   ALKPHOS 75 10/23/2022 1216   BILITOT 0.2 10/23/2022 1216      Component Value Date/Time   TSH 2.190 10/23/2022 1216   Nutritional Lab Results  Component Value Date   VD25OH 71.9 10/23/2022   VD25OH 63.1 11/17/2021   VD25OH 60.3 03/31/2021    Attestations:   I, Sonny Laroche, acting as a stage manager for Barnie Jenkins, DO., have compiled all relevant documentation for today's office visit on behalf of Barnie Jenkins, DO, while in the presence of [employer], DO.  I have spent 40 minutes in the care of the patient today including 30 minutes face-to-face assessing and reviewing listed medical problems above as outlined in office visit note and providing nutritional and behavioral counseling as outlined in obesity care plan.   I have reviewed the above documentation for accuracy and completeness, and I agree with the above. Barnie JINNY Jenkins, D.O.  The 21st Century Cures Act was signed into law in 2016 which includes the topic of electronic health records.  This provides immediate access to information in MyChart.  This includes consultation notes, operative notes, office notes, lab results and pathology reports.  If you have any questions about what you read please let us  know at your next visit so we can discuss your concerns and take corrective action if need be.  We are right here with you.  "

## 2024-02-11 ENCOUNTER — Ambulatory Visit: Admitting: Internal Medicine

## 2024-02-12 ENCOUNTER — Telehealth (INDEPENDENT_AMBULATORY_CARE_PROVIDER_SITE_OTHER): Payer: Self-pay | Admitting: Family Medicine

## 2024-02-12 NOTE — Telephone Encounter (Signed)
 Patient stated she would like a call from [facility]. Please give her a call at [phone].

## 2024-02-15 ENCOUNTER — Telehealth: Payer: Self-pay | Admitting: *Deleted

## 2024-02-15 NOTE — Telephone Encounter (Signed)
 Mammogram appointment 03/14/2024 @ 1:20 p m to arrive 1:00 pm /  appointment mailed to the  patient with information regarding the 75.00 no show fee.

## 2024-02-26 ENCOUNTER — Ambulatory Visit (HOSPITAL_COMMUNITY)

## 2024-03-06 ENCOUNTER — Ambulatory Visit (HOSPITAL_COMMUNITY)

## 2024-03-07 ENCOUNTER — Inpatient Hospital Stay (HOSPITAL_COMMUNITY): Admission: RE | Admit: 2024-03-07 | Attending: Neurology | Admitting: Neurology

## 2024-03-07 VITALS — BP 124/65 | HR 77 | Temp 97.3°F | Resp 16

## 2024-03-07 DIAGNOSIS — G35D Multiple sclerosis, unspecified: Secondary | ICD-10-CM

## 2024-03-07 MED ORDER — SODIUM CHLORIDE 0.9 % IV SOLN
300.0000 mg | Freq: Once | INTRAVENOUS | Status: AC
  Administered 2024-03-07: 300 mg via INTRAVENOUS
  Filled 2024-03-07: qty 15

## 2024-03-07 MED ORDER — SODIUM CHLORIDE 0.9 % IV SOLN: INTRAVENOUS | Status: AC

## 2024-03-07 NOTE — Progress Notes (Signed)
"   PATIENT CARE CENTER NOTE     Diagnosis: Multiple Sclerosis ( HCC) [G35]      Provider: Vear Ade, MD     Procedure: Tysabri  300mg  infusion     Note: Patient received Tysabri  infusion via PIV. No premeds required per orders. Tolerated infusion well with no adverse reaction. Vital signs stable. Printed AVS offered but patient refused. Infusion ordered for every 28 days and patient will scheduled next appointment at the front desk.  Patient alert, oriented and ambulatory at discharge. "

## 2024-03-12 ENCOUNTER — Ambulatory Visit (INDEPENDENT_AMBULATORY_CARE_PROVIDER_SITE_OTHER): Admitting: Physician Assistant

## 2024-03-14 ENCOUNTER — Ambulatory Visit

## 2024-04-16 ENCOUNTER — Ambulatory Visit (HOSPITAL_COMMUNITY)

## 2024-06-18 ENCOUNTER — Ambulatory Visit: Admitting: Dermatology

## 2024-07-02 ENCOUNTER — Ambulatory Visit: Admitting: Neurology
